# Patient Record
Sex: Female | Born: 1945 | Race: White | Hispanic: No | Marital: Married | Smoking: Former smoker
Health system: Southern US, Community
[De-identification: ages and names within clinical notes are randomized; demographics above are authoritative.]

## PROBLEM LIST (undated history)

## (undated) DIAGNOSIS — E785 Hyperlipidemia, unspecified: Secondary | ICD-10-CM

## (undated) DIAGNOSIS — M1990 Unspecified osteoarthritis, unspecified site: Secondary | ICD-10-CM

## (undated) DIAGNOSIS — T7840XA Allergy, unspecified, initial encounter: Secondary | ICD-10-CM

## (undated) DIAGNOSIS — I10 Essential (primary) hypertension: Secondary | ICD-10-CM

## (undated) DIAGNOSIS — J45909 Unspecified asthma, uncomplicated: Secondary | ICD-10-CM

## (undated) DIAGNOSIS — I743 Embolism and thrombosis of arteries of the lower extremities: Secondary | ICD-10-CM

## (undated) DIAGNOSIS — I480 Paroxysmal atrial fibrillation: Secondary | ICD-10-CM

## (undated) DIAGNOSIS — K5790 Diverticulosis of intestine, part unspecified, without perforation or abscess without bleeding: Secondary | ICD-10-CM

## (undated) DIAGNOSIS — D126 Benign neoplasm of colon, unspecified: Secondary | ICD-10-CM

## (undated) DIAGNOSIS — K589 Irritable bowel syndrome without diarrhea: Secondary | ICD-10-CM

## (undated) DIAGNOSIS — I739 Peripheral vascular disease, unspecified: Secondary | ICD-10-CM

## (undated) DIAGNOSIS — H548 Legal blindness, as defined in USA: Secondary | ICD-10-CM

## (undated) DIAGNOSIS — I341 Nonrheumatic mitral (valve) prolapse: Secondary | ICD-10-CM

## (undated) HISTORY — DX: Nonrheumatic mitral (valve) prolapse: I34.1

## (undated) HISTORY — DX: Unspecified asthma, uncomplicated: J45.909

## (undated) HISTORY — PX: COLONOSCOPY W/ POLYPECTOMY: SHX1380

## (undated) HISTORY — DX: Benign neoplasm of colon, unspecified: D12.6

## (undated) HISTORY — DX: Peripheral vascular disease, unspecified: I73.9

## (undated) HISTORY — DX: Essential (primary) hypertension: I10

## (undated) HISTORY — DX: Allergy, unspecified, initial encounter: T78.40XA

## (undated) HISTORY — DX: Hyperlipidemia, unspecified: E78.5

## (undated) HISTORY — PX: ABDOMINAL HYSTERECTOMY: SHX81

## (undated) HISTORY — PX: SHOULDER SURGERY: SHX246

## (undated) HISTORY — DX: Diverticulosis of intestine, part unspecified, without perforation or abscess without bleeding: K57.90

## (undated) HISTORY — PX: APPENDECTOMY: SHX54

## (undated) HISTORY — DX: Irritable bowel syndrome, unspecified: K58.9

## (undated) HISTORY — PX: TONSILLECTOMY: SUR1361

## (undated) HISTORY — DX: Unspecified osteoarthritis, unspecified site: M19.90

---

## 1998-06-14 HISTORY — PX: LUMBAR LAMINECTOMY: SHX95

## 1999-08-03 ENCOUNTER — Other Ambulatory Visit: Admission: RE | Admit: 1999-08-03 | Discharge: 1999-08-03 | Payer: Self-pay | Admitting: Obstetrics & Gynecology

## 2000-09-22 ENCOUNTER — Encounter: Payer: Self-pay | Admitting: Neurosurgery

## 2000-09-22 ENCOUNTER — Encounter: Admission: RE | Admit: 2000-09-22 | Discharge: 2000-09-22 | Payer: Self-pay | Admitting: Neurosurgery

## 2000-10-17 ENCOUNTER — Encounter: Payer: Self-pay | Admitting: Neurosurgery

## 2000-10-18 ENCOUNTER — Encounter: Payer: Self-pay | Admitting: Neurosurgery

## 2000-10-18 ENCOUNTER — Inpatient Hospital Stay (HOSPITAL_COMMUNITY): Admission: RE | Admit: 2000-10-18 | Discharge: 2000-10-19 | Payer: Self-pay | Admitting: Neurosurgery

## 2001-03-03 ENCOUNTER — Encounter: Payer: Self-pay | Admitting: Neurosurgery

## 2001-03-03 ENCOUNTER — Ambulatory Visit (HOSPITAL_COMMUNITY): Admission: RE | Admit: 2001-03-03 | Discharge: 2001-03-03 | Payer: Self-pay | Admitting: Neurosurgery

## 2004-05-21 ENCOUNTER — Ambulatory Visit: Payer: Self-pay | Admitting: Internal Medicine

## 2004-05-27 ENCOUNTER — Ambulatory Visit: Payer: Self-pay | Admitting: Internal Medicine

## 2004-09-22 ENCOUNTER — Other Ambulatory Visit: Admission: RE | Admit: 2004-09-22 | Discharge: 2004-09-22 | Payer: Self-pay | Admitting: Obstetrics & Gynecology

## 2004-12-10 ENCOUNTER — Ambulatory Visit: Payer: Self-pay | Admitting: Internal Medicine

## 2005-05-25 ENCOUNTER — Ambulatory Visit: Payer: Self-pay | Admitting: Internal Medicine

## 2005-05-28 ENCOUNTER — Ambulatory Visit: Payer: Self-pay | Admitting: Internal Medicine

## 2005-06-29 ENCOUNTER — Ambulatory Visit: Payer: Self-pay | Admitting: Gastroenterology

## 2005-07-05 ENCOUNTER — Ambulatory Visit: Payer: Self-pay | Admitting: Gastroenterology

## 2005-07-05 ENCOUNTER — Encounter (INDEPENDENT_AMBULATORY_CARE_PROVIDER_SITE_OTHER): Payer: Self-pay | Admitting: *Deleted

## 2006-01-04 ENCOUNTER — Ambulatory Visit: Payer: Self-pay | Admitting: Internal Medicine

## 2006-01-18 ENCOUNTER — Ambulatory Visit: Payer: Self-pay | Admitting: Internal Medicine

## 2006-04-07 ENCOUNTER — Ambulatory Visit: Payer: Self-pay | Admitting: Internal Medicine

## 2006-05-26 ENCOUNTER — Ambulatory Visit: Payer: Self-pay | Admitting: Internal Medicine

## 2006-06-01 ENCOUNTER — Ambulatory Visit: Payer: Self-pay | Admitting: Internal Medicine

## 2006-06-01 LAB — CONVERTED CEMR LAB
ALT: 32 units/L (ref 0–40)
AST: 30 units/L (ref 0–37)
BUN: 13 mg/dL (ref 6–23)
Chol/HDL Ratio, serum: 1.8
Cholesterol: 261 mg/dL (ref 0–200)
Creatinine, Ser: 0.6 mg/dL (ref 0.4–1.2)
Glucose, Bld: 83 mg/dL (ref 70–99)
HDL: 141.1 mg/dL (ref >39.0)
LDL DIRECT: 98.6 mg/dL
Potassium: 4.3 meq/L (ref 3.5–5.1)
Triglyceride fasting, serum: 51 mg/dL (ref 0–149)
VLDL: 10 mg/dL (ref 0–40)

## 2006-06-23 ENCOUNTER — Encounter: Admission: RE | Admit: 2006-06-23 | Discharge: 2006-06-23 | Payer: Self-pay | Admitting: Sports Medicine

## 2006-07-07 ENCOUNTER — Encounter: Admission: RE | Admit: 2006-07-07 | Discharge: 2006-07-07 | Payer: Self-pay | Admitting: Sports Medicine

## 2006-07-21 ENCOUNTER — Ambulatory Visit: Payer: Self-pay | Admitting: Internal Medicine

## 2006-08-12 ENCOUNTER — Ambulatory Visit: Payer: Self-pay | Admitting: Internal Medicine

## 2006-08-15 ENCOUNTER — Encounter: Admission: RE | Admit: 2006-08-15 | Discharge: 2006-08-15 | Payer: Self-pay | Admitting: Sports Medicine

## 2006-11-02 ENCOUNTER — Ambulatory Visit: Payer: Self-pay | Admitting: Internal Medicine

## 2006-11-08 DIAGNOSIS — N342 Other urethritis: Secondary | ICD-10-CM | POA: Insufficient documentation

## 2007-02-23 ENCOUNTER — Ambulatory Visit: Payer: Self-pay | Admitting: Internal Medicine

## 2007-02-23 DIAGNOSIS — J449 Chronic obstructive pulmonary disease, unspecified: Secondary | ICD-10-CM | POA: Insufficient documentation

## 2007-02-23 LAB — CONVERTED CEMR LAB
Cholesterol, target level: 200 mg/dL
HDL goal, serum: 40 mg/dL
LDL Goal: 100 mg/dL

## 2007-06-02 ENCOUNTER — Telehealth (INDEPENDENT_AMBULATORY_CARE_PROVIDER_SITE_OTHER): Payer: Self-pay | Admitting: *Deleted

## 2007-06-06 ENCOUNTER — Ambulatory Visit: Payer: Self-pay | Admitting: Internal Medicine

## 2007-06-06 DIAGNOSIS — I10 Essential (primary) hypertension: Secondary | ICD-10-CM | POA: Insufficient documentation

## 2007-06-06 DIAGNOSIS — E785 Hyperlipidemia, unspecified: Secondary | ICD-10-CM | POA: Insufficient documentation

## 2007-06-21 ENCOUNTER — Ambulatory Visit: Payer: Self-pay

## 2007-06-21 ENCOUNTER — Ambulatory Visit: Payer: Self-pay | Admitting: Internal Medicine

## 2007-08-15 ENCOUNTER — Telehealth: Payer: Self-pay | Admitting: Internal Medicine

## 2007-08-17 ENCOUNTER — Encounter (INDEPENDENT_AMBULATORY_CARE_PROVIDER_SITE_OTHER): Payer: Self-pay | Admitting: *Deleted

## 2007-09-21 ENCOUNTER — Ambulatory Visit: Payer: Self-pay | Admitting: Internal Medicine

## 2007-09-21 DIAGNOSIS — K219 Gastro-esophageal reflux disease without esophagitis: Secondary | ICD-10-CM | POA: Insufficient documentation

## 2007-09-28 ENCOUNTER — Ambulatory Visit: Payer: Self-pay | Admitting: Internal Medicine

## 2007-09-28 ENCOUNTER — Encounter (INDEPENDENT_AMBULATORY_CARE_PROVIDER_SITE_OTHER): Payer: Self-pay | Admitting: *Deleted

## 2007-09-28 LAB — CONVERTED CEMR LAB
OCCULT 1: NEGATIVE
OCCULT 2: NEGATIVE
OCCULT 3: NEGATIVE

## 2007-10-16 ENCOUNTER — Ambulatory Visit: Payer: Self-pay | Admitting: Internal Medicine

## 2007-10-16 DIAGNOSIS — K589 Irritable bowel syndrome without diarrhea: Secondary | ICD-10-CM | POA: Insufficient documentation

## 2007-10-24 ENCOUNTER — Encounter: Admission: RE | Admit: 2007-10-24 | Discharge: 2007-10-24 | Payer: Self-pay | Admitting: Sports Medicine

## 2007-11-14 ENCOUNTER — Encounter: Admission: RE | Admit: 2007-11-14 | Discharge: 2007-11-14 | Payer: Self-pay | Admitting: Sports Medicine

## 2007-11-27 ENCOUNTER — Ambulatory Visit: Payer: Self-pay | Admitting: Internal Medicine

## 2007-11-27 ENCOUNTER — Telehealth (INDEPENDENT_AMBULATORY_CARE_PROVIDER_SITE_OTHER): Payer: Self-pay | Admitting: *Deleted

## 2007-11-27 DIAGNOSIS — R51 Headache: Secondary | ICD-10-CM | POA: Insufficient documentation

## 2007-11-27 DIAGNOSIS — R42 Dizziness and giddiness: Secondary | ICD-10-CM | POA: Insufficient documentation

## 2007-11-27 DIAGNOSIS — R269 Unspecified abnormalities of gait and mobility: Secondary | ICD-10-CM | POA: Insufficient documentation

## 2007-11-27 DIAGNOSIS — R519 Headache, unspecified: Secondary | ICD-10-CM | POA: Insufficient documentation

## 2007-12-06 ENCOUNTER — Ambulatory Visit: Payer: Self-pay | Admitting: Internal Medicine

## 2007-12-06 DIAGNOSIS — G252 Other specified forms of tremor: Secondary | ICD-10-CM | POA: Insufficient documentation

## 2007-12-06 DIAGNOSIS — G250 Essential tremor: Secondary | ICD-10-CM | POA: Insufficient documentation

## 2007-12-08 ENCOUNTER — Encounter (INDEPENDENT_AMBULATORY_CARE_PROVIDER_SITE_OTHER): Payer: Self-pay | Admitting: *Deleted

## 2007-12-21 ENCOUNTER — Ambulatory Visit: Payer: Self-pay

## 2007-12-21 ENCOUNTER — Encounter: Payer: Self-pay | Admitting: Internal Medicine

## 2008-01-10 ENCOUNTER — Telehealth (INDEPENDENT_AMBULATORY_CARE_PROVIDER_SITE_OTHER): Payer: Self-pay | Admitting: *Deleted

## 2008-01-10 ENCOUNTER — Encounter (INDEPENDENT_AMBULATORY_CARE_PROVIDER_SITE_OTHER): Payer: Self-pay | Admitting: *Deleted

## 2008-02-01 ENCOUNTER — Ambulatory Visit: Payer: Self-pay | Admitting: Cardiovascular Disease

## 2008-03-05 ENCOUNTER — Ambulatory Visit: Payer: Self-pay | Admitting: Internal Medicine

## 2008-05-02 ENCOUNTER — Ambulatory Visit: Payer: Self-pay | Admitting: Internal Medicine

## 2008-05-06 ENCOUNTER — Telehealth (INDEPENDENT_AMBULATORY_CARE_PROVIDER_SITE_OTHER): Payer: Self-pay | Admitting: *Deleted

## 2008-06-03 ENCOUNTER — Telehealth: Payer: Self-pay | Admitting: Internal Medicine

## 2008-06-03 ENCOUNTER — Ambulatory Visit: Payer: Self-pay | Admitting: Internal Medicine

## 2008-06-03 DIAGNOSIS — Z86010 Personal history of colonic polyps: Secondary | ICD-10-CM | POA: Insufficient documentation

## 2008-06-03 DIAGNOSIS — M810 Age-related osteoporosis without current pathological fracture: Secondary | ICD-10-CM | POA: Insufficient documentation

## 2008-06-05 ENCOUNTER — Telehealth (INDEPENDENT_AMBULATORY_CARE_PROVIDER_SITE_OTHER): Payer: Self-pay | Admitting: *Deleted

## 2008-06-10 ENCOUNTER — Ambulatory Visit: Payer: Self-pay | Admitting: Internal Medicine

## 2008-06-15 LAB — CONVERTED CEMR LAB
ALT: 32 units/L (ref 0–35)
AST: 37 units/L (ref 0–37)
Albumin: 4 g/dL (ref 3.5–5.2)
Alkaline Phosphatase: 88 units/L (ref 39–117)
BUN: 13 mg/dL (ref 6–23)
Basophils Absolute: 0 10*3/uL (ref 0.0–0.1)
Basophils Relative: 0.3 % (ref 0.0–3.0)
Bilirubin, Direct: 0.1 mg/dL (ref 0.0–0.3)
CO2: 33 meq/L — ABNORMAL HIGH (ref 19–32)
Calcium: 8.7 mg/dL (ref 8.4–10.5)
Chloride: 96 meq/L (ref 96–112)
Cholesterol: 296 mg/dL (ref 0–200)
Creatinine, Ser: 0.5 mg/dL (ref 0.4–1.2)
Direct LDL: 120.3 mg/dL
Eosinophils Absolute: 0.1 10*3/uL (ref 0.0–0.7)
Eosinophils Relative: 1.5 % (ref 0.0–5.0)
GFR calc Af Amer: 160 mL/min
GFR calc non Af Amer: 132 mL/min
Glucose, Bld: 72 mg/dL (ref 70–99)
HCT: 40.9 % (ref 36.0–46.0)
HDL: 135 mg/dL (ref >39.0)
Hemoglobin: 14.1 g/dL (ref 12.0–15.0)
Lymphocytes Relative: 32.2 % (ref 12.0–46.0)
MCHC: 34.6 g/dL (ref 30.0–36.0)
MCV: 103.1 fL — ABNORMAL HIGH (ref 78.0–100.0)
Monocytes Absolute: 0.7 10*3/uL (ref 0.1–1.0)
Monocytes Relative: 8.5 % (ref 3.0–12.0)
Neutro Abs: 5.1 10*3/uL (ref 1.4–7.7)
Neutrophils Relative %: 57.5 % (ref 43.0–77.0)
Platelets: 328 10*3/uL (ref 150–400)
Potassium: 3.4 meq/L — ABNORMAL LOW (ref 3.5–5.1)
RBC: 3.97 M/uL (ref 3.87–5.11)
RDW: 13.9 % (ref 11.5–14.6)
Sodium: 138 meq/L (ref 135–145)
TSH: 4.67 microintl units/mL (ref 0.35–5.50)
Total Bilirubin: 0.7 mg/dL (ref 0.3–1.2)
Total Protein: 7.2 g/dL (ref 6.0–8.3)
Triglycerides: 81 mg/dL (ref 0–149)
VLDL: 16 mg/dL (ref 0–40)
WBC: 8.7 10*3/uL (ref 4.5–10.5)

## 2008-06-17 ENCOUNTER — Encounter (INDEPENDENT_AMBULATORY_CARE_PROVIDER_SITE_OTHER): Payer: Self-pay | Admitting: *Deleted

## 2008-06-25 ENCOUNTER — Encounter (INDEPENDENT_AMBULATORY_CARE_PROVIDER_SITE_OTHER): Payer: Self-pay | Admitting: *Deleted

## 2008-06-27 ENCOUNTER — Ambulatory Visit: Payer: Self-pay | Admitting: Internal Medicine

## 2008-06-27 LAB — CONVERTED CEMR LAB
OCCULT 1: NEGATIVE
OCCULT 2: NEGATIVE
OCCULT 3: POSITIVE

## 2008-07-08 ENCOUNTER — Ambulatory Visit: Payer: Self-pay | Admitting: Internal Medicine

## 2008-07-30 ENCOUNTER — Ambulatory Visit: Payer: Self-pay | Admitting: Gastroenterology

## 2008-08-06 ENCOUNTER — Encounter: Payer: Self-pay | Admitting: Gastroenterology

## 2008-08-06 ENCOUNTER — Ambulatory Visit: Payer: Self-pay | Admitting: Gastroenterology

## 2008-08-06 ENCOUNTER — Telehealth (INDEPENDENT_AMBULATORY_CARE_PROVIDER_SITE_OTHER): Payer: Self-pay | Admitting: *Deleted

## 2008-08-06 LAB — HM COLONOSCOPY: HM Colonoscopy: NORMAL

## 2008-08-07 ENCOUNTER — Encounter: Payer: Self-pay | Admitting: Gastroenterology

## 2008-09-09 ENCOUNTER — Ambulatory Visit: Payer: Self-pay | Admitting: Internal Medicine

## 2008-09-10 ENCOUNTER — Encounter (INDEPENDENT_AMBULATORY_CARE_PROVIDER_SITE_OTHER): Payer: Self-pay | Admitting: *Deleted

## 2008-09-10 LAB — CONVERTED CEMR LAB: Potassium: 4.2 meq/L (ref 3.5–5.1)

## 2009-03-24 ENCOUNTER — Encounter: Payer: Self-pay | Admitting: Internal Medicine

## 2009-05-14 ENCOUNTER — Encounter: Payer: Self-pay | Admitting: Internal Medicine

## 2009-06-04 ENCOUNTER — Ambulatory Visit: Payer: Self-pay | Admitting: Internal Medicine

## 2009-06-05 ENCOUNTER — Ambulatory Visit: Payer: Self-pay | Admitting: Internal Medicine

## 2009-06-08 LAB — CONVERTED CEMR LAB
ALT: 44 units/L — ABNORMAL HIGH (ref 0–35)
AST: 52 units/L — ABNORMAL HIGH (ref 0–37)
Albumin: 4.5 g/dL (ref 3.5–5.2)
Alkaline Phosphatase: 81 units/L (ref 39–117)
BUN: 8 mg/dL (ref 6–23)
Basophils Absolute: 0.1 10*3/uL (ref 0.0–0.1)
Basophils Relative: 0.7 % (ref 0.0–3.0)
Bilirubin, Direct: 0 mg/dL (ref 0.0–0.3)
CO2: 31 meq/L (ref 19–32)
Calcium: 9.6 mg/dL (ref 8.4–10.5)
Chloride: 96 meq/L (ref 96–112)
Cholesterol: 290 mg/dL — ABNORMAL HIGH (ref 0–200)
Creatinine, Ser: 0.6 mg/dL (ref 0.4–1.2)
Direct LDL: 101.5 mg/dL
Eosinophils Absolute: 0.3 10*3/uL (ref 0.0–0.7)
Eosinophils Relative: 3.4 % (ref 0.0–5.0)
Free T4: 0.8 ng/dL (ref 0.6–1.6)
GFR calc non Af Amer: 106.54 mL/min (ref >60)
Glucose, Bld: 81 mg/dL (ref 70–99)
HCT: 46.1 % — ABNORMAL HIGH (ref 36.0–46.0)
HDL: 172 mg/dL (ref >39.00)
Hemoglobin: 15.3 g/dL — ABNORMAL HIGH (ref 12.0–15.0)
Lymphocytes Relative: 25.4 % (ref 12.0–46.0)
Lymphs Abs: 2 10*3/uL (ref 0.7–4.0)
MCHC: 33.2 g/dL (ref 30.0–36.0)
MCV: 104.4 fL — ABNORMAL HIGH (ref 78.0–100.0)
Monocytes Absolute: 0.8 10*3/uL (ref 0.1–1.0)
Monocytes Relative: 9.9 % (ref 3.0–12.0)
Neutro Abs: 4.7 10*3/uL (ref 1.4–7.7)
Neutrophils Relative %: 60.6 % (ref 43.0–77.0)
Platelets: 292 10*3/uL (ref 150.0–400.0)
Potassium: 3.5 meq/L (ref 3.5–5.1)
RBC: 4.42 M/uL (ref 3.87–5.11)
RDW: 13.3 % (ref 11.5–14.6)
Sodium: 138 meq/L (ref 135–145)
TSH: 3.77 microintl units/mL (ref 0.35–5.50)
Theophylline Lvl: 12.4 ug/mL (ref 10.0–20.0)
Total Bilirubin: 0.9 mg/dL (ref 0.3–1.2)
Total CHOL/HDL Ratio: 2
Total Protein: 7.6 g/dL (ref 6.0–8.3)
Triglycerides: 76 mg/dL (ref 0.0–149.0)
VLDL: 15.2 mg/dL (ref 0.0–40.0)
Vit D, 25-Hydroxy: 14 ng/mL — ABNORMAL LOW (ref 30–89)
WBC: 7.9 10*3/uL (ref 4.5–10.5)

## 2009-06-09 ENCOUNTER — Encounter (INDEPENDENT_AMBULATORY_CARE_PROVIDER_SITE_OTHER): Payer: Self-pay | Admitting: *Deleted

## 2009-09-04 ENCOUNTER — Ambulatory Visit: Payer: Self-pay | Admitting: Internal Medicine

## 2009-09-07 LAB — CONVERTED CEMR LAB
ALT: 36 units/L — ABNORMAL HIGH (ref 0–35)
AST: 36 units/L (ref 0–37)
Albumin: 4.1 g/dL (ref 3.5–5.2)
Alkaline Phosphatase: 74 units/L (ref 39–117)
Bilirubin, Direct: 0 mg/dL (ref 0.0–0.3)
Cholesterol: 251 mg/dL — ABNORMAL HIGH (ref 0–200)
Direct LDL: 103.1 mg/dL
HDL: 139.8 mg/dL (ref >39.00)
Total Bilirubin: 0.4 mg/dL (ref 0.3–1.2)
Total CHOL/HDL Ratio: 2
Total Protein: 7.1 g/dL (ref 6.0–8.3)
Triglycerides: 80 mg/dL (ref 0.0–149.0)
VLDL: 16 mg/dL (ref 0.0–40.0)

## 2009-09-11 ENCOUNTER — Ambulatory Visit: Payer: Self-pay | Admitting: Family Medicine

## 2009-09-30 ENCOUNTER — Ambulatory Visit: Payer: Self-pay | Admitting: Internal Medicine

## 2010-02-03 ENCOUNTER — Ambulatory Visit: Payer: Self-pay | Admitting: Internal Medicine

## 2010-02-04 ENCOUNTER — Ambulatory Visit: Payer: Self-pay | Admitting: Internal Medicine

## 2010-02-05 LAB — CONVERTED CEMR LAB
BUN: 7 mg/dL (ref 6–23)
Basophils Absolute: 0.1 10*3/uL (ref 0.0–0.1)
Basophils Relative: 0.7 % (ref 0.0–3.0)
CO2: 29 meq/L (ref 19–32)
Calcium: 9.7 mg/dL (ref 8.4–10.5)
Chloride: 98 meq/L (ref 96–112)
Creatinine, Ser: 0.6 mg/dL (ref 0.4–1.2)
Eosinophils Absolute: 0 10*3/uL (ref 0.0–0.7)
Eosinophils Relative: 0.5 % (ref 0.0–5.0)
Free T4: 0.78 ng/dL (ref 0.60–1.60)
GFR calc non Af Amer: 104.31 mL/min (ref >60)
Glucose, Bld: 92 mg/dL (ref 70–99)
HCT: 45.8 % (ref 36.0–46.0)
Hemoglobin: 15.6 g/dL — ABNORMAL HIGH (ref 12.0–15.0)
Lymphocytes Relative: 17.5 % (ref 12.0–46.0)
Lymphs Abs: 1.6 10*3/uL (ref 0.7–4.0)
MCHC: 34.2 g/dL (ref 30.0–36.0)
MCV: 105.7 fL — ABNORMAL HIGH (ref 78.0–100.0)
Magnesium: 1.7 mg/dL (ref 1.5–2.5)
Monocytes Absolute: 0.8 10*3/uL (ref 0.1–1.0)
Monocytes Relative: 8.5 % (ref 3.0–12.0)
Neutro Abs: 6.5 10*3/uL (ref 1.4–7.7)
Neutrophils Relative %: 72.8 % (ref 43.0–77.0)
Platelets: 316 10*3/uL (ref 150.0–400.0)
Potassium: 4.3 meq/L (ref 3.5–5.1)
RBC: 4.33 M/uL (ref 3.87–5.11)
RDW: 14.4 % (ref 11.5–14.6)
Sodium: 140 meq/L (ref 135–145)
TSH: 2.88 microintl units/mL (ref 0.35–5.50)
Theophylline Lvl: 12.1 ug/mL (ref 10.0–20.0)
WBC: 9 10*3/uL (ref 4.5–10.5)

## 2010-06-05 ENCOUNTER — Encounter: Payer: Self-pay | Admitting: Internal Medicine

## 2010-06-05 ENCOUNTER — Ambulatory Visit: Payer: Self-pay | Admitting: Internal Medicine

## 2010-06-05 DIAGNOSIS — R0902 Hypoxemia: Secondary | ICD-10-CM | POA: Insufficient documentation

## 2010-06-05 DIAGNOSIS — I491 Atrial premature depolarization: Secondary | ICD-10-CM | POA: Insufficient documentation

## 2010-06-05 DIAGNOSIS — R0989 Other specified symptoms and signs involving the circulatory and respiratory systems: Secondary | ICD-10-CM | POA: Insufficient documentation

## 2010-06-09 ENCOUNTER — Ambulatory Visit
Admission: RE | Admit: 2010-06-09 | Discharge: 2010-06-09 | Payer: Self-pay | Source: Home / Self Care | Attending: Internal Medicine | Admitting: Internal Medicine

## 2010-06-09 LAB — CONVERTED CEMR LAB
ALT: 39 units/L — ABNORMAL HIGH (ref 0–35)
AST: 47 units/L — ABNORMAL HIGH (ref 0–37)
Albumin: 4.5 g/dL (ref 3.5–5.2)
Alkaline Phosphatase: 102 units/L (ref 39–117)
BUN: 14 mg/dL (ref 6–23)
Basophils Absolute: 0.1 10*3/uL (ref 0.0–0.1)
Basophils Relative: 0.6 % (ref 0.0–3.0)
Bilirubin, Direct: 0.2 mg/dL (ref 0.0–0.3)
CO2: 29 meq/L (ref 19–32)
Calcium: 9.5 mg/dL (ref 8.4–10.5)
Chloride: 92 meq/L — ABNORMAL LOW (ref 96–112)
Cholesterol: 277 mg/dL — ABNORMAL HIGH (ref 0–200)
Creatinine, Ser: 0.6 mg/dL (ref 0.4–1.2)
Direct LDL: 105.6 mg/dL
Eosinophils Absolute: 0.1 10*3/uL (ref 0.0–0.7)
Eosinophils Relative: 0.6 % (ref 0.0–5.0)
GFR calc non Af Amer: 98.59 mL/min (ref >60.00)
Glucose, Bld: 79 mg/dL (ref 70–99)
HCT: 44.7 % (ref 36.0–46.0)
HDL: 138.6 mg/dL (ref >39.00)
Hemoglobin: 15.3 g/dL — ABNORMAL HIGH (ref 12.0–15.0)
Lymphocytes Relative: 16.2 % (ref 12.0–46.0)
Lymphs Abs: 2.1 10*3/uL (ref 0.7–4.0)
MCHC: 34.2 g/dL (ref 30.0–36.0)
MCV: 102.8 fL — ABNORMAL HIGH (ref 78.0–100.0)
Monocytes Absolute: 1 10*3/uL (ref 0.1–1.0)
Monocytes Relative: 8.1 % (ref 3.0–12.0)
Neutro Abs: 9.5 10*3/uL — ABNORMAL HIGH (ref 1.4–7.7)
Neutrophils Relative %: 74.5 % (ref 43.0–77.0)
Platelets: 330 10*3/uL (ref 150.0–400.0)
Potassium: 3.5 meq/L (ref 3.5–5.1)
RBC: 4.34 M/uL (ref 3.87–5.11)
RDW: 13.6 % (ref 11.5–14.6)
Sodium: 136 meq/L (ref 135–145)
TSH: 2.83 microintl units/mL (ref 0.35–5.50)
Total Bilirubin: 0.8 mg/dL (ref 0.3–1.2)
Total CHOL/HDL Ratio: 2
Total Protein: 7.5 g/dL (ref 6.0–8.3)
Triglycerides: 53 mg/dL (ref 0.0–149.0)
VLDL: 10.6 mg/dL (ref 0.0–40.0)
Vit D, 25-Hydroxy: 18 ng/mL — ABNORMAL LOW (ref 30–89)
WBC: 12.7 10*3/uL — ABNORMAL HIGH (ref 4.5–10.5)

## 2010-06-12 ENCOUNTER — Telehealth (INDEPENDENT_AMBULATORY_CARE_PROVIDER_SITE_OTHER): Payer: Self-pay | Admitting: *Deleted

## 2010-06-16 ENCOUNTER — Encounter: Payer: Self-pay | Admitting: Internal Medicine

## 2010-06-17 ENCOUNTER — Encounter: Payer: Self-pay | Admitting: Internal Medicine

## 2010-06-17 ENCOUNTER — Ambulatory Visit
Admission: RE | Admit: 2010-06-17 | Discharge: 2010-06-17 | Payer: Self-pay | Source: Home / Self Care | Attending: Internal Medicine | Admitting: Internal Medicine

## 2010-06-18 ENCOUNTER — Ambulatory Visit: Admission: RE | Admit: 2010-06-18 | Discharge: 2010-06-18 | Payer: Self-pay | Source: Home / Self Care

## 2010-06-18 ENCOUNTER — Encounter: Payer: Self-pay | Admitting: Internal Medicine

## 2010-06-19 ENCOUNTER — Encounter: Payer: Self-pay | Admitting: Internal Medicine

## 2010-07-06 ENCOUNTER — Telehealth (INDEPENDENT_AMBULATORY_CARE_PROVIDER_SITE_OTHER): Payer: Self-pay | Admitting: *Deleted

## 2010-07-12 LAB — CONVERTED CEMR LAB
ALT: 24 units/L (ref 0–35)
AST: 28 units/L (ref 0–37)
Albumin: 4.6 g/dL (ref 3.5–5.2)
Alkaline Phosphatase: 85 units/L (ref 39–117)
BUN: 11 mg/dL (ref 6–23)
BUN: 9 mg/dL (ref 6–23)
Basophils Absolute: 0 10*3/uL (ref 0.0–0.1)
Basophils Absolute: 0 10*3/uL (ref 0.0–0.1)
Basophils Relative: 0.2 % (ref 0.0–1.0)
Basophils Relative: 0.5 % (ref 0.0–1.0)
Bilirubin, Direct: 0.1 mg/dL (ref 0.0–0.3)
CO2: 28 meq/L (ref 19–32)
CO2: 30 meq/L (ref 19–32)
Calcium: 9.3 mg/dL (ref 8.4–10.5)
Calcium: 9.5 mg/dL (ref 8.4–10.5)
Chloride: 100 meq/L (ref 96–112)
Chloride: 99 meq/L (ref 96–112)
Cholesterol: 272 mg/dL (ref 0–200)
Creatinine, Ser: 0.5 mg/dL (ref 0.4–1.2)
Creatinine, Ser: 0.8 mg/dL (ref 0.4–1.2)
Direct LDL: 121.2 mg/dL
Eosinophils Absolute: 0 10*3/uL (ref 0.0–0.7)
Eosinophils Absolute: 0.3 10*3/uL (ref 0.0–0.6)
Eosinophils Relative: 0.1 % (ref 0.0–5.0)
Eosinophils Relative: 3.2 % (ref 0.0–5.0)
GFR calc Af Amer: 160 mL/min
GFR calc Af Amer: 93 mL/min
GFR calc non Af Amer: 132 mL/min
GFR calc non Af Amer: 77 mL/min
Glucose, Bld: 105 mg/dL
Glucose, Bld: 77 mg/dL (ref 70–99)
Glucose, Bld: 94 mg/dL (ref 70–99)
HCT: 42.1 % (ref 36.0–46.0)
HCT: 42.7 % (ref 36.0–46.0)
HDL: 141.5 mg/dL (ref >39.0)
Hemoglobin: 14.4 g/dL
Hemoglobin: 14.9 g/dL (ref 12.0–15.0)
Hemoglobin: 15 g/dL (ref 12.0–15.0)
Lymphocytes Relative: 17.5 % (ref 12.0–46.0)
Lymphocytes Relative: 21.6 % (ref 12.0–46.0)
MCHC: 35.2 g/dL (ref 30.0–36.0)
MCHC: 35.4 g/dL (ref 30.0–36.0)
MCV: 100.3 fL — ABNORMAL HIGH (ref 78.0–100.0)
MCV: 103.5 fL — ABNORMAL HIGH (ref 78.0–100.0)
Monocytes Absolute: 0.7 10*3/uL (ref 0.1–1.0)
Monocytes Absolute: 0.7 10*3/uL (ref 0.2–0.7)
Monocytes Relative: 7.6 % (ref 3.0–11.0)
Monocytes Relative: 7.8 % (ref 3.0–12.0)
Neutro Abs: 6 10*3/uL (ref 1.4–7.7)
Neutro Abs: 7.2 10*3/uL (ref 1.4–7.7)
Neutrophils Relative %: 67.4 % (ref 43.0–77.0)
Neutrophils Relative %: 74.1 % (ref 43.0–77.0)
Platelets: 357 10*3/uL (ref 150–400)
Platelets: 390 10*3/uL (ref 150–400)
Potassium: 3.5 meq/L (ref 3.5–5.1)
Potassium: 4.3 meq/L (ref 3.5–5.1)
RBC: 4.13 M/uL (ref 3.87–5.11)
RBC: 4.2 M/uL (ref 3.87–5.11)
RDW: 13.3 % (ref 11.5–14.6)
RDW: 14.5 % (ref 11.5–14.6)
Sodium: 138 meq/L (ref 135–145)
Sodium: 141 meq/L (ref 135–145)
TSH: 4.04 microintl units/mL (ref 0.35–5.50)
Total Bilirubin: 0.9 mg/dL (ref 0.3–1.2)
Total CHOL/HDL Ratio: 1.9
Total Protein: 7.5 g/dL (ref 6.0–8.3)
Triglycerides: 55 mg/dL (ref 0–149)
VLDL: 11 mg/dL (ref 0–40)
WBC: 8.9 10*3/uL (ref 4.5–10.5)
WBC: 9.6 10*3/uL (ref 4.5–10.5)

## 2010-07-16 NOTE — Letter (Signed)
Summary: Results Follow up Letter  [facility]  [address] [place], [place] [address]   Phone: [phone]  Fax: [phone]    06/09/2009 MRN: [id number]  Crystal [name] [address] [place], [place]  [address]  Dear [name],  The following are the results of your recent test(s):  Test         Result    Pap Smear:        Normal _____  Not Normal _____ Comments: ______________________________________________________ Cholesterol: LDL(Bad cholesterol):         Your goal is less than:         HDL (Good cholesterol):       Your goal is more than: Comments:  ______________________________________________________ Mammogram:        Normal _____  Not Normal _____ Comments:  ___________________________________________________________________ Hemoccult:        Normal _____  Not normal _______ Comments:    _____________________________________________________________________ Other Tests: Please see attached labs done on 06/05/2009, call to schedule appointment for recheck    We routinely do not discuss normal results over the telephone.  If you desire a copy of the results, or you have any questions about this information we can discuss them at your next office visit.   Sincerely,

## 2010-07-16 NOTE — Miscellaneous (Signed)
Summary: Orders Update  Clinical Lists Changes  Orders: Added new Test order of Carotid Duplex (Carotid Duplex) - Signed 

## 2010-07-16 NOTE — Assessment & Plan Note (Signed)
Summary: ROA FOLLOW UP FOR NEW MEDICATION//ALJ   Vital Signs:  Patient Profile:   65 Years Old Female Weight:      139 pounds Temp:     97.8 degrees F oral Pulse rate:   88 / minute Resp:     14 per minute BP sitting:   130 / 82  (left arm) Cuff size:   large  Pt. in pain?   no  Vitals Entered By: Shonna Chock (Oct 16, 2007 3:41 PM)                  Chief Complaint:  MEDICATION FOLLOW-UP.  History of Present Illness: On Align until  10/14/07. Minor loose stool 09/15/07.Last colonoscopy 2007; due 2012 as per Dr Russella Dar.    Current Allergies (reviewed today): ! * ASA BASED MEDS ! ZOCOR     Review of Systems  General      Denies chills, fever, sweats, and weight loss.  GI      Denies abdominal pain, bloody stools, constipation, dark tarry stools, diarrhea, gas, indigestion, loss of appetite, nausea, and vomiting.      Eating smaller meals; no tiggers except soy protein drink. No issues with dairy or gluten. Occa bloating (GYn exam neg)  GU      Gyn exam neg 2 weeks ago   Physical Exam  General:     Well-developed,well-nourished,in no acute distress; alert,appropriate and cooperative throughout examination Mouth:     Oral mucosa and oropharynx without lesions or exudates.  Teeth in good repair. Neck:     No deformities, masses, or tenderness noted. Lungs:     Normal respiratory effort, chest expands symmetrically. Lungs are clear to auscultation, no crackles or wheezes. Heart:     Normal rate and regular rhythm. S1 and S2 normal without gallop, murmur, click, rub or other extra sounds. Abdomen:     Bowel sounds positive,abdomen soft and non-tender without masses, organomegaly or hernias noted. Skin:     Intact without suspicious lesions or rashes Cervical Nodes:     No lymphadenopathy noted Axillary Nodes:     No palpable lymphadenopathy    Impression & Recommendations:  Problem # 1:  IBS (ICD-564.1)  Complete Medication List: 1)  Proair Hfa 108 (90  Base) Mcg/act Aers (Albuterol sulfate) .... Prn 2)  Singulair 10 Mg Tabs (Montelukast sodium) .Marland Kitchen.. 1 daily 3)  Lasix 40 Mg Tabs (Furosemide) .Marland Kitchen.. 1 by mouth qd 4)  Calcium With Vitamin D 600mg   .... 1 daily 5)  Pravachol 40 Mg Tabs (Pravastatin sodium) .Marland Kitchen.. 1 qhs 6)  Theophylline Cr 200 Mg Tb12 (Theophylline) .Marland Kitchen.. 1 every 12hrs. 7)  Alprazolam 0.5 Mg Tabs (Alprazolam) .... Prn 8)  Cozaar 50 Mg Tabs (Losartan potassium) .... 1/2 daily 9)  Meclizine Hcl 25 Mg Tabs (Meclizine hcl) .... 1/2 -1 q 8 hours prn 10)  Align Caps (Misc intestinal flora regulat) .Marland Kitchen.. 1 by mouth once daily 11)  Mentax 1 % Crea (Butenafine hcl) .... Apply two times a day prn   Patient Instructions: 1)  Monitor for diarrhea triggers. Align once daily as needed    Prescriptions: MENTAX 1 %  CREA (BUTENAFINE HCL) apply two times a day prn  #30 grams x 2   Entered and Authorized by:   Marga Melnick MD   Signed by:   Marga Melnick MD on 10/16/2007   Method used:   Print then Give to Patient   RxID:   [id number]  ]

## 2010-07-16 NOTE — Progress Notes (Signed)
Summary: Discuss PFT Test  Phone Note Call from Patient Call back at Home Phone [phone]   Caller: Patient Summary of Call: Message left on voicemail: Patient is questioning if she still needs breathing/PFT test since chest xray is normal   I spoke with patient and informed her that he did not indicate that she should NOT have it done so she should proceed and have test done. Patient ok'd.Shonna Chock CMA  June 12, 2010 3:06 PM

## 2010-07-16 NOTE — Progress Notes (Signed)
Summary: Refill Request  Phone Note Refill Request Call back at [phone] Message from:  Pharmacy on July 06, 2010 9:04 AM  Refills Requested: Medication #1:  THEOPHYLLINE CR 200 MG  TB12 1 every 12hrs.   Dosage confirmed as above?Dosage Confirmed   Supply Requested: 180   Last Refilled: 04/11/2010   Notes: 3 refills [facility] on [address].   Next Appointment Scheduled: none Initial call taken by: Harold Barban,  July 06, 2010 9:04 AM    Prescriptions: THEOPHYLLINE CR 200 MG  TB12 (THEOPHYLLINE) 1 every 12hrs.  #180 x 2   Entered by:   Shonna Chock CMA   Authorized by:   Marga Melnick MD   Signed by:   Shonna Chock CMA on 07/06/2010   Method used:   Electronically to        [facility] [phone]* (retail)       [address]       [place], [place]  [address]       Ph: [phone] or [phone]       Fax: [phone]   RxID:   [id number]

## 2010-07-16 NOTE — Assessment & Plan Note (Signed)
Summary: FOR CONGESTION//PH   Vital Signs:  Patient profile:   65 year old female Weight:      141 pounds Temp:     98.0 degrees F oral Pulse rate:   82 / minute Pulse rhythm:   regular BP sitting:   122 / 80  (left arm) Cuff size:   regular  Vitals Entered By: Army Fossa CMA (September 11, 2009 11:07 AM) CC: Pt here c/o head congestion, chest congestion since tuesday. has tried Mucinex., Cough   History of Present Illness:  Cough      This is a 65 year old woman who presents with Cough.  The symptoms began 3 days ago.  The patient reports productive cough and wheezing, but denies non-productive cough, pleuritic chest pain, shortness of breath, exertional dyspnea, fever, hemoptysis, and malaise.  Associated symtpoms include sore throat.  The patient denies the following symptoms: cold/URI symptoms, nasal congestion, chronic rhinitis, weight loss, acid reflux symptoms, and peripheral edema.  The cough is worse with lying down.  Ineffective prior treatments have included OTC cough medication, albuterol inhaler, and other asthma medication.  Risk factors include history of asthma.    Current Medications (verified): 1)  Proair Hfa 108 (90 Base) Mcg/act Aers (Albuterol Sulfate) .... Prn 2)  Singulair 10 Mg Tabs (Montelukast Sodium) .Marland Kitchen.. 1 Daily 3)  Lasix 40 Mg Tabs (Furosemide) .Marland Kitchen.. 1 By Mouth Qd 4)  Calcium With Vitamin D 600mg  .... 1 Daily 5)  Pravachol 40 Mg  Tabs (Pravastatin Sodium) .... 1/2 At Bedtime Due To Mild Lft Elevation 6)  Theophylline Cr 200 Mg  Tb12 (Theophylline) .Marland Kitchen.. 1 Every 12hrs. 7)  Cozaar 50 Mg  Tabs (Losartan Potassium) .... 1/2 Daily 8)  Align   Caps (Misc Intestinal Flora Regulat) .Marland Kitchen.. 1 By Mouth Once Daily 9)  Mentax 1 %  Crea (Butenafine Hcl) .... Apply Two Times A Day Prn 10)  Tramadol Hcl 50 Mg  Tabs (Tramadol Hcl) .Marland Kitchen.. 1 Q 6 Hrs As Needed Ha or Back Pain 11)  Diazepam 5 Mg Tabs (Diazepam) .... 1/2 - 1 Q 8 Hrs As Needed Dizziness 12)  Prednisone 10 Mg Tabs  (Prednisone) .... 3 By Mouth Once Daily For 3 Days Then 2 By Mouth Once Daily For 3 Days Then 1 By Mouth Once Daily For 3 Days 13)  Augmentin 875-125 Mg Tabs (Amoxicillin-Pot Clavulanate) .Marland Kitchen.. 1 By Mouth Two Times A Day 14)  Cheratussin Ac 100-10 Mg/2ml Syrp (Guaifenesin-Codeine) .Marland Kitchen.. 1-2 Tsp By Mouth At Bedtime As Needed  Allergies: 1)  ! Jonne Ply Based Meds 2)  ! Zocor 3)  ! * Ivp Dye  Past History:  Past medical, surgical, family and social histories (including risk factors) reviewed for relevance to current acute and chronic problems.  Past Medical History: Reviewed history from 06/04/2009 and no changes required. IBS MVP diagnosed by  2D ECHO in 1980s Asthma (EIB ,perfume triggers) Hyperlipidemia, LDL goal = < 120 Peripheral vascular disease Colonic polyps, hx of, adenomatous, 06/2005, neg 2009, Dr Russella Dar Osteopenia : T score  -1.1 @ spine; -2.3 @ hip Hypertension Allergies Diverticulosis Hemorrhoids Anal Fissure 1970  Past Surgical History: Reviewed history from 06/04/2009 and no changes required. plastic surgery (eyelid surgery) Colon polypectomy 2007, Dr Russella Dar Hysterectomy & BSO &  incidental appendectomy  1973 for dysfunctional bleeding(done  in  [place]), now seeing  Dr Aldona Bar  Lumbar laminectomy 2002, Dr Venetia Maxon Tonsillectomy  Family History: Reviewed history from 06/04/2009 and no changes required. Father:  MI @  25 Mother: Parkinson's Siblings: bro d MI @ 48;3 sisters COAD ( emphysema); 2 sisters DM (1 IDDM)  Social History: Reviewed history from 06/04/2009 and no changes required. Former Smoker, quit 1990; low fat Married Alcohol use-yes: 2 glasses wine / night Regular exercise-yes: 30 min on Elliptical, water aerobics, Pilates  Review of Systems      See HPI  Physical Exam  General:  Well-developed,well-nourished,in no acute distress; alert,appropriate and cooperative throughout examination Ears:  External ear exam shows no significant lesions or  deformities.  Otoscopic examination reveals clear canals, tympanic membranes are intact bilaterally without bulging, retraction, inflammation or discharge. Hearing is grossly normal bilaterally. Nose:  External nasal examination shows no deformity or inflammation. Nasal mucosa are pink and moist without lesions or exudates. Mouth:  Oral mucosa and oropharynx without lesions or exudates.  Teeth in good repair. Neck:  No deformities, masses, or tenderness noted. Lungs:  R decreased breath sounds and L decreased breath sounds.   Heart:  normal rate.   Psych:  Oriented X3 and normally interactive.     Impression & Recommendations:  Problem # 1:  BRONCHITIS- ACUTE (ICD-466.0)  Her updated medication list for this problem includes:    Proair Hfa 108 (90 Base) Mcg/act Aers (Albuterol sulfate) .Marland Kitchen... Prn    Singulair 10 Mg Tabs (Montelukast sodium) .Marland Kitchen... 1 daily    Theophylline Cr 200 Mg Tb12 (Theophylline) .Marland Kitchen... 1 every 12hrs.    Augmentin 875-125 Mg Tabs (Amoxicillin-pot clavulanate) .Marland Kitchen... 1 by mouth two times a day    Cheratussin Ac 100-10 Mg/26ml Syrp (Guaifenesin-codeine) .Marland Kitchen... 1-2 tsp by mouth at bedtime as needed  Take antibiotics and other medications as directed. Encouraged to push clear liquids, get enough rest, and take acetaminophen as needed. To be seen in 5-7 days if no improvement, sooner if worse.  Orders: Nebulizer Tx (78295) Admin of Therapeutic Inj  intramuscular or subcutaneous (62130) Depo- Medrol 80mg  (J1040)  Complete Medication List: 1)  Proair Hfa 108 (90 Base) Mcg/act Aers (Albuterol sulfate) .... Prn 2)  Singulair 10 Mg Tabs (Montelukast sodium) .Marland Kitchen.. 1 daily 3)  Lasix 40 Mg Tabs (Furosemide) .Marland Kitchen.. 1 by mouth qd 4)  Calcium With Vitamin D 600mg   .... 1 daily 5)  Pravachol 40 Mg Tabs (Pravastatin sodium) .... 1/2 at bedtime due to mild lft elevation 6)  Theophylline Cr 200 Mg Tb12 (Theophylline) .Marland Kitchen.. 1 every 12hrs. 7)  Cozaar 50 Mg Tabs (Losartan potassium) .... 1/2  daily 8)  Align Caps (Misc intestinal flora regulat) .Marland Kitchen.. 1 by mouth once daily 9)  Mentax 1 % Crea (Butenafine hcl) .... Apply two times a day prn 10)  Tramadol Hcl 50 Mg Tabs (Tramadol hcl) .Marland Kitchen.. 1 q 6 hrs as needed ha or back pain 11)  Diazepam 5 Mg Tabs (Diazepam) .... 1/2 - 1 q 8 hrs as needed dizziness 12)  Prednisone 10 Mg Tabs (Prednisone) .... 3 by mouth once daily for 3 days then 2 by mouth once daily for 3 days then 1 by mouth once daily for 3 days 13)  Augmentin 875-125 Mg Tabs (Amoxicillin-pot clavulanate) .Marland Kitchen.. 1 by mouth two times a day 14)  Cheratussin Ac 100-10 Mg/96ml Syrp (Guaifenesin-codeine) .Marland Kitchen.. 1-2 tsp by mouth at bedtime as needed Prescriptions: CHERATUSSIN AC 100-10 MG/5ML SYRP (GUAIFENESIN-CODEINE) 1-2 tsp by mouth at bedtime as needed  #6 oz x 0   Entered and Authorized by:   Loreen Freud DO   Signed by:   Loreen Freud DO on 09/11/2009   Method  used:   Print then Give to Patient   RxID:   [id number] AUGMENTIN 875-125 MG TABS (AMOXICILLIN-POT CLAVULANATE) 1 by mouth two times a day  #20 x 0   Entered and Authorized by:   Loreen Freud DO   Signed by:   Loreen Freud DO on 09/11/2009   Method used:   Electronically to        [facility] [phone]* (retail)       [address]       [place], [place]  [address]       Ph: [phone] or [phone]       Fax: [phone]   RxID:   [id number] PREDNISONE 10 MG TABS (PREDNISONE) 3 by mouth once daily for 3 days then 2 by mouth once daily for 3 days then 1 by mouth once daily for 3 days  #18 x 0   Entered and Authorized by:   Loreen Freud DO   Signed by:   Loreen Freud DO on 09/11/2009   Method used:   Electronically to        [facility] [phone]* (retail)       [address]       [place], [place]  [address]       Ph: [phone] or [phone]       Fax: [phone]   RxID:   [id number]    Medication  Administration  Injection # 1:    Medication: Depo- Medrol 80mg     Diagnosis: BRONCHITIS- ACUTE (ICD-466.0)    Route: IM    Site: RUOQ gluteus    Exp Date: 04/2010    Lot #: [id number]    Mfr: novaplus    Patient tolerated injection without complications    Given by: Army Fossa CMA (September 11, 2009 11:37 AM)  Orders Added: 1)  Est. Patient Level IV [60737] 2)  Nebulizer Tx [10626] 3)  Admin of Therapeutic Inj  intramuscular or subcutaneous [96372] 4)  Depo- Medrol 80mg  [J1040]

## 2010-07-16 NOTE — Assessment & Plan Note (Signed)
Summary: still "winded"//lch   Vital Signs:  Patient profile:   65 year old female Weight:      143.4 pounds O2 Sat:      94 % Temp:     98.4 degrees F oral Pulse rate:   103 / minute Resp:     18 per minute BP sitting:   148 / 82  (left arm) Cuff size:   regular  Vitals Entered By: Shonna Chock (September 30, 2009 10:15 AM) CC: SOB Comments REVIEWED MED LIST, PATIENT AGREED DOSE AND INSTRUCTION CORRECT    Primary Care Provider:  Marga Melnick, MD  CC:  SOB.  History of Present Illness: Initially improved from last OV until 7 days ago w/o trigger.No change in meds (list reviewed); she is not on a controller @ present, previously on Flovent. No increase in rescue MDI use.  Allergies: 1)  ! Jonne Ply Based Meds 2)  ! Zocor 3)  ! * Ivp Dye  Review of Systems General:  Complains of chills; denies fever and sweats. ENT:  Complains of postnasal drainage; denies nasal congestion and sinus pressure; Frontal headache w/o facial pain or purulence. CV:  Complains of difficulty breathing at night; denies swelling of feet and swelling of hands. Resp:  Complains of cough, shortness of breath, sputum productive, and wheezing. Allergy:  Complains of sneezing; denies itching eyes.  Physical Exam  General:  well-nourished,in no acute distress but appears fatigued; alert,appropriate and cooperative throughout examination Ears:  Wax bilaterally Nose:  External nasal examination shows no deformity or inflammation. Nasal mucosa are pink and moist without lesions or exudates. Mouth:  Oral mucosa and oropharynx without lesions or exudates.  Teeth in good repair. Lungs:  Decreased BS except mild rhonchi @ bases Heart:  Normal rate and regular rhythm. S1 and S2 normal without gallop, murmur, click, rub.S4 Cervical Nodes:  No lymphadenopathy noted Axillary Nodes:  No palpable lymphadenopathy   Impression & Recommendations:  Problem # 1:  BRONCHITIS- ACUTE (ICD-466.0)  Her updated medication list  for this problem includes:    Proair Hfa 108 (90 Base) Mcg/act Aers (Albuterol sulfate) .Marland Kitchen... Prn    Singulair 10 Mg Tabs (Montelukast sodium) .Marland Kitchen... 1 daily    Theophylline Cr 200 Mg Tb12 (Theophylline) .Marland Kitchen... 1 every 12hrs.    Cheratussin Ac 100-10 Mg/46ml Syrp (Guaifenesin-codeine) .Marland Kitchen... 1-2 tsp by mouth at bedtime as needed    Azithromycin 250 Mg Tabs (Azithromycin) .Marland Kitchen... As per pack    Symbicort 160-4.5 Mcg/act Aero (Budesonide-formoterol fumarate) .Marland Kitchen... 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 Prn    Singulair 10 Mg Tabs (Montelukast sodium) .Marland Kitchen... 1 daily    Theophylline Cr 200 Mg Tb12 (Theophylline) .Marland Kitchen... 1 every 12hrs.    Symbicort 160-4.5 Mcg/act Aero (Budesonide-formoterol fumarate) .Marland Kitchen... 1-2 puffs every 12 hrs ; gargle & spit after use  Complete Medication List: 1)  Proair Hfa 108 (90 Base) Mcg/act Aers (Albuterol sulfate) .... Prn 2)  Singulair 10 Mg Tabs (Montelukast sodium) .Marland Kitchen.. 1 daily 3)  Lasix 40 Mg Tabs (Furosemide) .Marland Kitchen.. 1 by mouth qd 4)  Calcium With Vitamin D 600mg   .... 1 daily 5)  Pravachol 40 Mg Tabs (Pravastatin sodium) .... 1/2 at bedtime due to mild lft elevation 6)  Theophylline Cr 200 Mg Tb12 (Theophylline) .Marland Kitchen.. 1 every 12hrs. 7)  Cozaar 50 Mg Tabs (Losartan potassium) .... 1/2 daily 8)  Align Caps (Misc intestinal flora regulat) .Marland Kitchen.. 1 by mouth once daily 9)  Mentax 1 % Crea (Butenafine hcl) .... Apply two times a day prn 10)  Tramadol Hcl 50 Mg Tabs (Tramadol hcl) .Marland Kitchen.. 1 q 6 hrs as needed ha or back pain 11)  Diazepam 5 Mg Tabs (Diazepam) .... 1/2 - 1 q 8 hrs as needed dizziness 12)  Cheratussin Ac 100-10 Mg/51ml Syrp (Guaifenesin-codeine) .Marland Kitchen.. 1-2 tsp by mouth at bedtime as needed 13)  Azithromycin 250 Mg Tabs (Azithromycin) .... As per pack 14)  Symbicort 160-4.5 Mcg/act Aero  (Budesonide-formoterol fumarate) .Marland Kitchen.. 1-2 puffs every 12 hrs ; gargle & spit after use  Patient Instructions: 1)  Drink as much fluid as you can tolerate for the next few days. Prescriptions: SYMBICORT 160-4.5 MCG/ACT AERO (BUDESONIDE-FORMOTEROL FUMARATE) 1-2 puffs every 12 hrs ; gargle & spit after use  #1 x 11   Entered and Authorized by:   Marga Melnick MD   Signed by:   Marga Melnick MD on 09/30/2009   Method used:   Print then Give to Patient   RxID:   [id number] AZITHROMYCIN 250 MG TABS (AZITHROMYCIN) as per pack  #1 x 0   Entered and Authorized by:   Marga Melnick MD   Signed by:   Marga Melnick MD on 09/30/2009   Method used:   Print then Give to Patient   RxID:   [id number]

## 2010-07-16 NOTE — Letter (Signed)
Summary: Primary Care Consult Scheduled Letter  [facility]  [address] [place], [place] [address]   Phone: [phone]  Fax: [phone]      12/08/2007 MRN: [id number]  Crystal [name] [address] [place], [place]  [address]    Dear [name],      We have scheduled an appointment for you.  At the recommendation of Dr.Hopper, we have scheduled you a Doppler Test on July 9th at 1:30pm.  Their address is [address] The office phone number is [phone].  If this appointment day and time is not convenient for you, please feel free to call the office of the doctor you are being referred to at the number listed above and reschedule the appointment.     It is important for you to keep your scheduled appointments. We are here to make sure you are given good patient care. If you have questions or you have made changes to your appointment, please notify us at  [phone], ask for Tiffany.    Thank you,  Patient Care Coordinator [place] [facility]

## 2010-07-16 NOTE — Assessment & Plan Note (Signed)
Summary: FOR HEADACHE//PH   Vital Signs:  Patient profile:   65 year old female Weight:      145 pounds Temp:     98.1 degrees F oral Pulse rate:   88 / minute Resp:     17 per minute BP supine:   138 / 82  (left arm) BP sitting:   142 / 80  (left arm) BP standing:   140 / 86  (left arm) Cuff size:   regular  Vitals Entered By: Shonna Chock CMA (February 03, 2010 11:50 AM) CC: Ongoing concerns: Headaches and Tremors, Syncope   Primary Care Provider:  Marga Melnick, MD  CC:  Ongoing concerns: Headaches and Tremors and Syncope.  History of Present Illness:      This is a 65 year old woman who presents with  lightheadedness for 3-4 weeks.  The patient reports palpitations and lightheadedness, but denies loss of consciousness, premonitory symptoms, near loss of consciousness, chest pain, shortness of breath, and incontinence.  Associated symptoms include headache,intention  tremor of hands ,feeling warm, and diaphoresis("cold sweat").  The patient denies the following symptoms: abdominal discomfort, nausea, vomiting, pallor, focal weakness, blurred vision, and perioral numbness.  The patient reports the following precipitating factors: standing upright.  The symptoms have always  occurred in the setting of standing position.  No BPV symptoms.  Headaches      The patient also presents with Headaches for 2& 1/2 months ago in context of neck & shoulder pain. Dr Margaretha Sheffield  Rxed PT & traction with benefit..  The patient reports nasal congestion and photophobia, but denies tearing of eyes, sinus pain, sinus pressure, and phonophobia.  The headache is described as intermittent and dull.  The location of the pain is frontal & in occiput.  High-risk features (red flags) include neck pain/stiffness, pain worse with exertion, and age >50 years.  The patient denies the following high-risk features: fever, vision loss or change, focal weakness, and rash.  The headaches are ?precipitated by change in weather, ie  the recent heat wave.  No relieving factors; minimal response to ASA or NSAIDS. PMH of migraines similar to these ,but "these are not as bad"  Current Medications (verified): 1)  Proair Hfa 108 (90 Base) Mcg/act Aers (Albuterol Sulfate) .... Prn 2)  Singulair 10 Mg Tabs (Montelukast Sodium) .Marland Kitchen.. 1 Daily 3)  Lasix 40 Mg Tabs (Furosemide) .Marland Kitchen.. 1 By Mouth Qd 4)  Calcium With Vitamin D 600mg  .... 1 Daily 5)  Pravachol 40 Mg  Tabs (Pravastatin Sodium) .... 1/2 At Bedtime Due To Mild Lft Elevation 6)  Theophylline Cr 200 Mg  Tb12 (Theophylline) .Marland Kitchen.. 1 Every 12hrs. 7)  Cozaar 50 Mg  Tabs (Losartan Potassium) .... 1/2 Daily 8)  Align   Caps (Misc Intestinal Flora Regulat) .Marland Kitchen.. 1 By Mouth Once Daily 9)  Mentax 1 %  Crea (Butenafine Hcl) .... Apply Two Times A Day Prn 10)  Tramadol Hcl 50 Mg  Tabs (Tramadol Hcl) .Marland Kitchen.. 1 Q 6 Hrs As Needed Ha or Back Pain 11)  Diazepam 5 Mg Tabs (Diazepam) .... 1/2 - 1 Q 8 Hrs As Needed Dizziness 12)  Symbicort 160-4.5 Mcg/act Aero (Budesonide-Formoterol Fumarate) .Marland Kitchen.. 1-2 Puffs Every 12 Hrs ; Gargle & Spit After Use  Allergies: 1)  ! Jonne Ply Based Meds 2)  ! Zocor 3)  ! * Ivp Dye  Review of Systems GI:  Denies bloody stools and dark tarry stools. Psych:  Denies anxiety and depression.  Physical Exam  General:  Tremulous but  in no acute distress; alert,appropriate and cooperative throughout examination Eyes:  No corneal or conjunctival inflammation noted. EOMI. Perrla. Field of  Vision grossly normal. No lid lag Ears:  External ear exam shows no significant lesions or deformities.  Otoscopic examination reveals  wax L > R. Hearing is grossly normal bilaterally. Mouth:  Oral mucosa and oropharynx without lesions or exudates.  Teeth in good repair.No tongue deviation Neck:  No deformities, masses, or tenderness noted. Supple Lungs:  Normal respiratory effort, chest expands symmetrically. Lungs are clear to auscultation, no crackles or wheezes. Decreased BS Heart:   Normal rate and regular rhythm. S1 and S2 normal without gallop, murmur, click, rub or other extra sounds. Pulses:  R and L carotid  pulses are full and equal bilaterally. Bruit on L Extremities:  No clubbing, cyanosis, edema, or deformity noted with normal full range of motion of all joints.   Neurologic:  alert & oriented X3, strength normal in all extremities, gait slightly unsteady , DTRs symmetrical and 1& 1/2 +, finger-to-nose  with tremor; Romberg  slightly unsteady Cervical Nodes:  No lymphadenopathy noted Axillary Nodes:  No palpable lymphadenopathy Psych:  memory intact for recent and remote and slightly anxious.     Impression & Recommendations:  Problem # 1:  DIZZINESS (ICD-780.4)  Problem # 2:  HEADACHE (ICD-784.0)  Her updated medication list for this problem includes:    Tramadol Hcl 50 Mg Tabs (Tramadol hcl) .Marland Kitchen... 1 q 6 hrs as needed ha or back pain  Problem # 3:  TREMOR (ICD-781.0)  Complete Medication List: 1)  Proair Hfa 108 (90 Base) Mcg/act Aers (Albuterol sulfate) .... Prn 2)  Singulair 10 Mg Tabs (Montelukast sodium) .Marland Kitchen.. 1 daily 3)  Lasix 40 Mg Tabs (Furosemide) .Marland Kitchen.. 1 by mouth qd 4)  Calcium With Vitamin D 600mg   .... 1 daily 5)  Pravachol 40 Mg Tabs (Pravastatin sodium) .... 1/2 at bedtime due to mild lft elevation 6)  Theophylline Cr 200 Mg Tb12 (Theophylline) .Marland Kitchen.. 1 every 12hrs. 7)  Cozaar 50 Mg Tabs (Losartan potassium) .... 1/2 daily 8)  Align Caps (Misc intestinal flora regulat) .Marland Kitchen.. 1 by mouth once daily 9)  Mentax 1 % Crea (Butenafine hcl) .... Apply two times a day prn 10)  Tramadol Hcl 50 Mg Tabs (Tramadol hcl) .Marland Kitchen.. 1 q 6 hrs as needed ha or back pain 11)  Diazepam 5 Mg Tabs (Diazepam) .... 1/2 - 1 q 8 hrs as needed dizziness 12)  Symbicort 160-4.5 Mcg/act Aero (Budesonide-formoterol fumarate) .Marland Kitchen.. 1-2 puffs every 12 hrs ; gargle & spit after use 13)  Gabapentin 100 Mg Caps (Gabapentin) .Marland Kitchen.. 1 every 8 hrs as needed headache  Patient  Instructions: 1)  Isometric exercises pre standing. Fasting Labs in am: 2)  BMP ; Theophylline level; free T4;TSH ;Mg++;CBC w/ Diff . Prescriptions: GABAPENTIN 100 MG CAPS (GABAPENTIN) 1 every 8 hrs as needed headache  #30 x 1   Entered and Authorized by:   Marga Melnick MD   Signed by:   Marga Melnick MD on 02/03/2010   Method used:   Faxed to ...       [facility]  [address] [phone]* (retail)       [address]       [place], [place]  [address]       Ph: [phone] or [phone]       Fax: [phone]   RxID:   [id number]

## 2010-07-16 NOTE — Miscellaneous (Signed)
Summary: Orders Update-PFT Charges   Clinical Lists Changes  Orders: Added new Service order of Spirometry (Pre & Post) [phone]) - Signed Added new Service order of Lung Volumes (98119) - Signed Added new Service order of Carbon Monoxide diffusing w/capacity (14782) - Signed

## 2010-07-16 NOTE — Assessment & Plan Note (Signed)
Summary: FLU/CDJ   Nurse Visit    Prior Medications: PROAIR HFA 108 (90 BASE) MCG/ACT AERS (ALBUTEROL SULFATE) prn SINGULAIR 10 MG TABS (MONTELUKAST SODIUM) 1 daily LASIX 40 MG TABS (FUROSEMIDE) 1 by mouth qd CALCIUM WITH VITAMIN D 600MG  () 1 daily PRAVACHOL 40 MG  TABS (PRAVASTATIN SODIUM) 1 qHS THEOPHYLLINE CR 200 MG  TB12 (THEOPHYLLINE) 1 every 12hrs. ALPRAZOLAM 0.5 MG  TABS (ALPRAZOLAM) prn COZAAR 50 MG  TABS (LOSARTAN POTASSIUM) 1/2 daily MECLIZINE HCL 25 MG  TABS (MECLIZINE HCL) 1/2 -1 q 8 hours prn ALIGN   CAPS (MISC INTESTINAL FLORA REGULAT) 1 by mouth once daily MENTAX 1 %  CREA (BUTENAFINE HCL) apply two times a day prn VERAPAMIL HCL 80 MG  TABS (VERAPAMIL HCL) 1 q 8-12 hrs as needed tremor or headaches TRAMADOL HCL 50 MG  TABS (TRAMADOL HCL) 1 q 6 hrs as needed ha or back pain Current Allergies: ! * ASA BASED MEDS ! ZOCOR    Orders Added: 1)  Flu Vaccine 40yrs + [16109] 2)  Admin of Therapeutic Inj (IM or [place]) [[address]]   Flu Vaccine Consent Questions     Do you have a history of severe allergic reactions to this vaccine? no    Any prior history of allergic reactions to egg and/or gelatin? no    Do you have a sensitivity to the preservative Thimersol? no    Do you have a past history of Guillan-Barre Syndrome? no    Do you currently have an acute febrile illness? no    Have you ever had a severe reaction to latex? no    Vaccine information given and explained to patient? yes    Are you currently pregnant? no    Lot Number:[id number]   Site Given  Left Deltoid IM.opcflu

## 2010-07-16 NOTE — Assessment & Plan Note (Signed)
Summary: YEARLY EXAM AND FASTING LABS///SPH   Vital Signs:  Patient profile:   65 year old female Height:      63.25 inches Weight:      143.6 pounds BMI:     25.33 O2 Sat:      89 % on Room air Temp:     97.8 degrees F oral Pulse rate:   92 / minute Resp:     16 per minute BP sitting:   118 / 70  (left arm) Cuff size:   regular  Vitals Entered By: Shonna Chock CMA (June 05, 2010 8:27 AM)  O2 Flow:  Room air CC: CPX with fasting labs , COPD follow-up, Lipid Management   Primary Care Provider:  Marga Melnick, MD  CC:  CPX with fasting labs , COPD follow-up, and Lipid Management.  History of Present Illness: Here for Medicare AWV: 1.Risk factors based on Past M, S, F history:see Diagnoses ( chart updated) 2.Physical Activities: water aerobics, walking , weights 4-5X/week > 60 min 3.Depression/mood: no issues 4.Hearing: whisper heard @ 6 ft 5.ADL's:no limitations  6.Fall Risk: no imbalance 7.Home Safety: home  safety proofed 8.Height, weight, &visual acuity:wall chart read @ 6 ft w/o lenses 9.Counseling: POA & Living Will in place 10.Labs ordered based on risk factors: see Orders 11. Referral Coordination; none requested 12.  Care Plan:see Instructions 13. Cognitive Assessment: Oriented x 3; memory & recall  excellent ;"WORLD" spelles backwards; mood & affect normal  COPD Follow-Up       The patient denies shortness of breath, chest tightness, wheezing, cough, increased sputum, nocturnal awakening, and exercise induced symptoms.  Medication use includes quick relief med weekly and controller med daily, Symbicort.  Triggers are second hand smoke & perfume. Hypertension Follow-Up        The patient reports edema ( controlled by Dalbert Garnet) , but denies lightheadedness, urinary frequency, headaches, and fatigue.  Associated symptoms include occasional  palpitations.  The patient denies the following associated symptoms: chest pain, chest pressure, exercise intolerance, and  syncope.  Compliance with medications (by patient report) has been near 100%.  Adjunctive measures currently used by the patient include salt restriction.  BP @ home 120/70-80. Hyperlipidemia Follow-Up      The patient denies muscle aches, GI upset, abdominal pain, constipation, and diarrhea.  Compliance with medications (by patient report) has been near 100%.  Adjunctive measures currently used by the patient include fiber and folic acid.    Lipid Management History:      Positive NCEP/ATP III risk factors include female age 1 years old or older, family history for ischemic heart disease (males less than 46 years old), hypertension, and peripheral vascular disease.  Negative NCEP/ATP III risk factors include no history of early menopause without estrogen hormone replacement, non-diabetic, HDL cholesterol greater than 60, non-tobacco-user status, no ASHD (atherosclerotic heart disease), no prior stroke/TIA, and no history of aortic aneurysm.     Preventive Screening-Counseling & Management  Alcohol-Tobacco     Alcohol drinks/day: 2     Smoking Status: quit > 6 months     Packs/Day: 1.0     Year Started: 1970     Year Quit: 1990  Caffeine-Diet-Exercise     Caffeine use/day: none  Hep-HIV-STD-Contraception     Dental Visit-last 6 months yes     Sun Exposure-Excessive: no  Safety-Violence-Falls     Seat Belt Use: yes     Smoke Detectors: yes      Blood Transfusions:  no.  Travel History:  2006 [place].    Current Medications (verified): 1)  Proair Hfa 108 (90 Base) Mcg/act Aers (Albuterol Sulfate) .... Prn 2)  Singulair 10 Mg Tabs (Montelukast Sodium) .Marland Kitchen.. 1 Daily 3)  Lasix 40 Mg Tabs (Furosemide) .Marland Kitchen.. 1 By Mouth Qd 4)  Calcium With Vitamin D 600mg  .... 1 Daily 5)  Pravachol 40 Mg  Tabs (Pravastatin Sodium) .... 1/2 At Bedtime Due To Mild Lft Elevation 6)  Theophylline Cr 200 Mg  Tb12 (Theophylline) .Marland Kitchen.. 1 Every 12hrs. 7)  Cozaar 50 Mg  Tabs (Losartan Potassium) ....  1/2 Daily 8)  Align   Caps (Misc Intestinal Flora Regulat) .Marland Kitchen.. 1 By Mouth Once Daily 9)  Mentax 1 %  Crea (Butenafine Hcl) .... Apply Two Times A Day Prn 10)  Tramadol Hcl 50 Mg  Tabs (Tramadol Hcl) .Marland Kitchen.. 1 Q 6 Hrs As Needed Ha or Back Pain 11)  Diazepam 5 Mg Tabs (Diazepam) .... 1/2 - 1 Q 8 Hrs As Needed Dizziness 12)  Symbicort 160-4.5 Mcg/act Aero (Budesonide-Formoterol Fumarate) .Marland Kitchen.. 1-2 Puffs Every 12 Hrs ; Gargle & Spit After Use  Allergies: 1)  ! Jonne Ply Based Meds 2)  ! Zocor 3)  ! 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 Framingham Study LDL goal =< 100. Peripheral vascular disease: carotid bruits Colonic polyps, PMH  of, adenomatous, 06/2005, neg 2009, Dr Russella Dar Osteopenia : T score  -1.1 @ spine; -2.3 @ hip Hypertension Allergies Diverticulosis Hemorrhoids Anal Fissure 1970  Past Surgical History: plastic surgery (eyelid surgery) Colon polypectomy 2007, Dr Claudette Head Hysterectomy & BSO &  incidental appendectomy  1973 for dysfunctional bleeding (done  in  [place]), now seeing  Dr Aldona Bar  Lumbar laminectomy 2002, Dr Venetia Maxon Tonsillectomy R shoulder surgery 03/2010, Dr Eulah Pont  Family History: Father:  MI @ 56 Mother: Parkinson's Disease Siblings: bro: d MI @ 48;3 sisters: COAD ( emphysema); 2 sisters :DM (1 IDDM)  Social History: Former Smoker, quit 1990; low fat Married Alcohol use-yes: 2 glasses wine / night Regular exercise-yes: 30 min on Elliptical, water aerobics Smoking Status:  quit > 6 months Packs/Day:  1.0 Caffeine use/day:  none Dental Care w/in 6 mos.:  yes Sun Exposure-Excessive:  no Seat Belt Use:  yes Blood Transfusions:  no  Review of Systems  The patient denies anorexia, fever, vision loss, decreased hearing, hoarseness, hemoptysis, melena, hematochezia, severe indigestion/heartburn, hematuria,  suspicious skin lesions, abnormal bleeding, enlarged lymph nodes, and angioedema.    Physical Exam  General:  well-nourished;alert,appropriate and cooperative throughout examination Head:  Normocephalic and atraumatic without obvious abnormalities.  Eyes:  No corneal or conjunctival inflammation noted. Slight arcus senilis.Perrla. Funduscopic exam benign, without hemorrhages, exudates or papilledema. Ears:  External ear exam shows no significant lesions or deformities.  Otoscopic examination reveals clear canals, tympanic membranes are intact bilaterally without bulging, retraction, inflammation or discharge. Hearing is grossly normal bilaterally. Nose:  External nasal examination shows no deformity or inflammation. Nasal mucosa are pink and moist without lesions or exudates. Mouth:  Oral mucosa and oropharynx without lesions or exudates.  Teeth in good repair. Neck:  No deformities, masses, or tenderness noted. Lungs:  Normal respiratory effort, chest expands symmetrically. Lungs are clear to auscultation, no crackles or wheezes but decreased BS . Heart:  Normal rate and regular rhythm. S1 and S2 normal without gallop, murmur,  click, rub. S4 Abdomen:  Bowel sounds positive,abdomen soft and non-tender without masses, organomegaly or hernias noted. Genitalia:  Dr Aldona Bar Msk:  No deformity or scoliosis noted of thoracic or lumbar spine.   Pulses:  R and L carotid,radial,dorsalis pedis and posterior tibial pulses are full and equal bilaterally Extremities:  Slight  clubbing &  cyanosis suggested. No  edema. Minimal DIP OA changes Neurologic:  alert & oriented X3 and DTRs symmetrical and normal.   Skin:  Intact without suspicious lesions or rashes Cervical Nodes:  No lymphadenopathy noted Axillary Nodes:  No palpable lymphadenopathy Psych:  memory intact for recent and remote, normally interactive, and good eye contact.     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 Prn    Singulair 10 Mg Tabs (Montelukast sodium) .Marland Kitchen... 1 daily    Theophylline Cr 200 Mg Tb12 (Theophylline) .Marland Kitchen... 1 every 12hrs.    Symbicort 160-4.5 Mcg/act Aero (Budesonide-formoterol fumarate) .Marland Kitchen... 1-2 puffs every 12 hrs ; gargle & spit after use  Orders: Misc. Referral (Misc. Ref) T-2 View CXR (71020TC) Venipuncture (44010)  Problem # 8:  UNSPECIFIED ESSENTIAL HYPERTENSION (ICD-401.9)  controlled Her updated medication list for this problem includes:    Lasix 40 Mg Tabs (Furosemide) .Marland Kitchen... 1 by mouth qd    Cozaar 50 Mg Tabs (Losartan potassium) .Marland Kitchen... 1/2 daily  Orders: Medicare -1st Annual Wellness Visit [phone]) EKG w/ Interpretation (93000) Venipuncture (66440) TLB-BMP (Basic Metabolic  Panel-BMET) (80048-METABOL)  Complete Medication List: 1)  Proair Hfa 108 (90 Base) Mcg/act Aers (Albuterol sulfate) .... Prn 2)  Singulair 10 Mg Tabs (Montelukast sodium) .Marland Kitchen.. 1 daily 3)  Lasix 40 Mg Tabs (Furosemide) .Marland Kitchen.. 1 by mouth qd 4)  Calcium With Vitamin D 600mg   .... 1 daily 5)  Pravachol 40 Mg Tabs (Pravastatin sodium) .... 1/2 at bedtime due to mild lft elevation 6)  Theophylline Cr 200 Mg Tb12 (Theophylline) .Marland Kitchen.. 1 every 12hrs. 7)  Cozaar 50 Mg Tabs (Losartan potassium) .... 1/2 daily 8)  Align Caps (Misc intestinal flora regulat) .Marland Kitchen.. 1 by mouth once daily 9)  Mentax 1 % Crea (Butenafine hcl) .... Apply two times a day prn 10)  Tramadol Hcl 50 Mg Tabs (Tramadol hcl) .Marland Kitchen.. 1 q 6 hrs as needed ha or back pain 11)  Diazepam 5 Mg Tabs (Diazepam) .... 1/2 - 1 q 8 hrs as needed dizziness 12)  Symbicort 160-4.5 Mcg/act Aero (Budesonide-formoterol fumarate) .Marland Kitchen.. 1-2 puffs every 12 hrs ; gargle & spit after use  Other Orders: Pneumococcal Vaccine (16109) Admin 1st Vaccine (60454)  Lipid Assessment/Plan:      Based on NCEP/ATP III, the patient's risk factor category is "history of coronary disease, peripheral vascular disease, cerebrovascular disease, or aortic aneurysm".  The patient's lipid goals are as follows: Total cholesterol goal is 200; LDL cholesterol goal is 100; HDL cholesterol goal is 40; Triglyceride goal is 150.  Her LDL cholesterol goal has been met.    Patient Instructions: 1)  Follow up will be scheduled after labs, Xrays & studies completed. 2)  Take an  81 mg coated Aspirin every day. BMD every 25 months. Change in meds will be determined by pending labs. Avoid excess stimulants . Please complete Doppler of carotids & Lung Function Studies. Colonoscopy as per GI . Prescriptions: SYMBICORT 160-4.5 MCG/ACT AERO (BUDESONIDE-FORMOTEROL FUMARATE) 1-2 puffs every 12 hrs ; gargle & spit after use  #1 x 11   Entered and Authorized by:   Marga Melnick MD   Signed by:    Marga Melnick MD on 06/05/2010   Method used:   Print then Give to Patient   RxID:   [id number] DIAZEPAM 5 MG TABS (DIAZEPAM) 1/2 - 1 q 8 hrs as needed dizziness  #30 x 5   Entered and Authorized by:   Marga Melnick MD   Signed by:   Marga Melnick MD on 06/05/2010   Method used:   Print then Give to Patient   RxID:   [id number] TRAMADOL HCL 50 MG  TABS (TRAMADOL HCL) 1 q 6 hrs as needed ha or back pain  #60 x 5   Entered and Authorized by:   Marga Melnick MD   Signed by:   Marga Melnick MD on 06/05/2010   Method used:   Print then Give to Patient   RxID:   [id number] COZAAR 50 MG  TABS (LOSARTAN POTASSIUM) 1/2 daily  #90 x 3   Entered and Authorized by:   Marga Melnick MD   Signed by:   Marga Melnick MD on 06/05/2010   Method used:   Print then Give to Patient   RxID:   [id number] THEOPHYLLINE CR 200 MG  TB12 (THEOPHYLLINE) 1 every 12hrs.  #180 x 3   Entered and Authorized by:   Marga Melnick MD   Signed by:   Marga Melnick MD on 06/05/2010   Method used:   Print then Give to Patient   RxID:   [id number] PRAVACHOL 40 MG  TABS (PRAVASTATIN SODIUM) 1/2 at bedtime due to mild LFT elevation  #90 x 1   Entered and Authorized by:   Marga Melnick MD   Signed by:   Marga Melnick MD on 06/05/2010   Method used:   Print then Give to Patient   RxID:   [id number] LASIX 40 MG TABS (FUROSEMIDE) 1 by mouth qd  #90 Tablet x 3   Entered and Authorized by:   Marga Melnick MD   Signed by:   Marga Melnick MD on 06/05/2010   Method used:   Print then Give to Patient   RxID:   [id number] SINGULAIR 10 MG TABS (MONTELUKAST SODIUM) 1 daily  #90 Tablet x 3   Entered and Authorized by:   Marga Melnick MD   Signed by:   Marga Melnick MD on 06/05/2010   Method used:   Print then Give to Patient   RxID:   [id number] PROAIR HFA 108 (90 BASE) MCG/ACT AERS (ALBUTEROL SULFATE) prn  #25.5 x 5   Entered and  Authorized by:   Marga Melnick MD   Signed  by:   Marga Melnick MD on 06/05/2010   Method used:   Print then Give to Patient   RxID:   [id number]    Orders Added: 1)  Est. Patient Level III [57846] 2)  Medicare -1st Annual Wellness Visit [G0438] 3)  EKG w/ Interpretation [93000] 4)  Misc. Referral [Misc. Ref] 5)  T-2 View CXR [71020TC] 6)  Venipuncture [36415] 7)  TLB-Lipid Panel [80061-LIPID] 8)  TLB-BMP (Basic Metabolic Panel-BMET) [80048-METABOL] 9)  TLB-CBC Platelet - w/Differential [85025-CBCD] 10)  TLB-Hepatic/Liver Function Pnl [80076-HEPATIC] 11)  TLB-TSH (Thyroid Stimulating Hormone) [84443-TSH] 12)  T-Vitamin D (25-Hydroxy) [96295-28413] 13)  Doppler Referral [Doppler] 14)  Pneumococcal Vaccine [90732] 15)  Admin 1st Vaccine [90471] 16)  Specimen Handling [99000]   Immunizations Administered:  Pneumonia Vaccine:    Vaccine Type: Pneumovax (Medicare)    Site: left deltoid    Mfr: Merck    Dose: 0.5 ml    Route: IM    Given by: Shonna Chock CMA    Exp. Date: 10/14/2011    Lot #: [id number]   Immunizations Administered:  Pneumonia Vaccine:    Vaccine Type: Pneumovax (Medicare)    Site: left deltoid    Mfr: Merck    Dose: 0.5 ml    Route: IM    Given by: Shonna Chock CMA    Exp. Date: 10/14/2011    Lot #: [id number]

## 2010-10-05 ENCOUNTER — Other Ambulatory Visit (INDEPENDENT_AMBULATORY_CARE_PROVIDER_SITE_OTHER): Payer: Medicare Other

## 2010-10-05 DIAGNOSIS — R7401 Elevation of levels of liver transaminase levels: Secondary | ICD-10-CM

## 2010-10-05 DIAGNOSIS — R7402 Elevation of levels of lactic acid dehydrogenase (LDH): Secondary | ICD-10-CM

## 2010-10-05 DIAGNOSIS — E785 Hyperlipidemia, unspecified: Secondary | ICD-10-CM

## 2010-10-05 LAB — HEPATIC FUNCTION PANEL
ALT: 47 U/L — ABNORMAL HIGH (ref 0–35)
AST: 49 U/L — ABNORMAL HIGH (ref 0–37)
Albumin: 4.4 g/dL (ref 3.5–5.2)
Alkaline Phosphatase: 102 U/L (ref 39–117)
Bilirubin, Direct: 0.1 mg/dL (ref 0.0–0.3)
Total Bilirubin: 0.5 mg/dL (ref 0.3–1.2)
Total Protein: 7.5 g/dL (ref 6.0–8.3)

## 2010-10-06 LAB — VITAMIN D 25 HYDROXY (VIT D DEFICIENCY, FRACTURES): Vit D, 25-Hydroxy: 44 ng/mL (ref 30–89)

## 2010-10-27 NOTE — Assessment & Plan Note (Signed)
[facility]                            CARDIOLOGY OFFICE NOTE   NAME:Dugger, NERA HAWORTH                         MRN:          [id number]  DATE:02/01/2008                            DOB:          04-Oct-1945    Ms. Howland is seen today as a new patient.  She is 65 years old.  She has  a history of mitral valve prolapse, palpitations, question left carotid  bruit.  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   However, after review of the exam and also the impressions by Dr.  Excell Seltzer, there was no plaque formation bilaterally.  The stenosis was  called based on velocity criteria.   The patient really does not have 40-59% bilateral disease.  Her mid ICA  velocity was 1.19 cm/sec.  Her mid left velocity was 1.21 cm/sec.  However, in the setting of no plaque formation by 2-D examination, I  would have read these as 0-39% and explained the velocities as being  elevated for nonstenotic reasons.  I explained all these to [facility].  She  has not had any significant TIAs.  She has had a recurrence of her  migraines, which is being worked up by Dr. Alwyn Ren.  She apparently had  an MRI and CT, which showed no significant pathology.  She had migraines  back in the 80s, and they have recurred recently.  I do not think her  carotid exam has anything to do with this.   She also carries a diagnosis of mitral valve prolapse, and this was made  by Dr. Ladona Ridgel.  We have not done an echo on her here.  She has been doing  endocarditis prophylaxis.  It does not sound like true MVP and there was  initially diagnosis made for palpitations.   She has not had any significant recent chest pain.  She had a normal  stress Myoview study done on June 21, 2007 with good LV function.   This was done for history of palpitations and rapid heart rate.   She does have some asthma, and I think some of her palpitations have to  do with the use of albuterol.   Her review of  systems is otherwise negative.  Again, her biggest concern  lately had been recurrence of her migraines.   Her past medical history is remarkable for hysterectomy, previous back  surgery, history of hypertension, history of allergies, asthma, and  history of MVP.   She is allergic to IVP DYE.   She is happily married.  She is a Futures trader.  Her husband is a patient  of mine.  She does not drink or smoke.  She does all activities of daily  living.   Family history is noncontributory.   Father did have heart disease in his 52s, but I am not sure what type.  Mother died at age 25.  Father died at age 35 of a heart attack.   CURRENT MEDICINES:  1. Lasix 40 a day.  2. Albuterol.  3. Singulair 10 a day.  4. Pravachol 40 a  day.  5. Maalox.  6. Dulcolax p.r.n.  7. Theophylline 300 b.i.d.  8. Cozaar 25 a day.   PHYSICAL EXAMINATION:  GENERAL:  Remarkable for a healthy-appearing  white female in no distress.  VITAL SIGNS:  Blood pressure is 130/80, pulse is 80 and regular,  respiratory rate 14, afebrile, weight is 137.  HEENT:  Unremarkable.  NECK:  Carotids have no bruit to my exam.  No lymphadenopathy,  thyromegaly, or JVP elevation.  LUNGS:  Clear.  Good diaphragmatic motion.  No wheezing.  CARDIAC:  S1, S2.  I do not hear MVP or a murmur.  PMI normal.  ABDOMEN:  Benign.  Bowel sounds positive.  No AAA.  No tenderness.  No  bruit.  No hepatosplenomegaly or hepatojugular reflux.  No tenderness.  EXTREMITIES:  Distal pulses are intact.  No edema.  NEURO:  Nonfocal.  SKIN:  Warm and dry.  MUSCULOSKELETAL:  No muscular weakness.   EKG is totally normal.   Her carotid PV study was reviewed.  Her Myoview study was reviewed.   IMPRESSION:  1. Elevated carotid velocity is not related to plaque formation.      Followup carotid duplex in 2 years.  I do not think this has      anything to do with any clinical symptoms.  I would not have      classified her as having 40-59% stenosis  since no plaque was      visualized.  Can have a baby aspirin a day.  2. Recurrent migraine headaches.  Follow up with Dr. Alwyn Ren.  I      suspect that she has had CT, MRI, and hopefully also MRA to rule      out any aneurysm formation.  Consider followup with [facility].  3. Significant allergies.  I wonder if theophylline may be the      etiology to her elevated cardiac output and velocities in the      carotid arteries, it is certainly a vasodilator.  Currently appears      stable.  Continue Singulair and p.r.n. albuterol.  4. History of mitral valve prolapse, does not have it clinically, does      not need subacute bacterial endocarditis prophylaxis.  No need for      followup echo.  5. History of palpitations, likely exacerbated by theophylline and      albuterol.  No evidence of paroxysmal supraventricular tachycardia      and I continue to monitor.  6. Hypertension, currently well controlled.  Continue current dose of      Lasix and Cozaar.   The patient will be seen on an as-needed basis, and I would not repeat  another duplex for 2-3 years unless there are new symptoms.     Noralyn Pick. Eden Emms, MD, [facility]  Electronically Signed    PCN/MedQ  DD: 02/01/2008  DT: 02/02/2008  Job #: [id number]   cc:   Titus Dubin. Alwyn Ren, MD,FACP,FCCP

## 2010-10-27 NOTE — Assessment & Plan Note (Signed)
[facility]                        [facility] OFFICE NOTE   NAME:Barabas, RONNETTA CURRINGTON                         MRN:          [id number]  DATE:11/02/2006                            DOB:          [birth year]    The patient was seen on Nov 02, 2006 for a medication refill. She also  has an acute problem, diarrhea which began on May 14. It has been  associated with cramping. She denies fever or sweats but has a chills.  It has progressed since the 14th.   On the 9th, she had nausea and vomiting after a meal; her husband ate  from the same plate and did not get sick.   PAST MEDICAL HISTORY:  Diverticulosis and colonoscopy in 2007. She also  had colon polyps and internal hemorrhoids.   She has had no significant travel. She denies any recent antibiotic  therapy. She has no sick pet. No one in her close circle of friends or  in her family has had a similar illness.   She is due for refills on albuterol; the guidelines defining  uncontrolled asthma were reviewed . She uses the albuterol basically  only before exercise. She states her reactive airway is well controlled.   She specifically denies paroxysmal nocturnal dyspnea or need for rescue  doses of this albuterol.   VITAL SIGNS:  Her weight is essentially stable at 135.6, temperature is  96.8, pulse 60, respirations 15, and blood pressure 100/64.  HEENT:  She has no icterus or jaundice.  LYMPH:  She has no lymphadenopathy about the head, neck or axilla.  NECK:  Thyroid has slightly asymmetric nodules.  CHEST:  Surprisingly clear.  HEART:  Heart sounds are slightly distant. She does have an S4.  ABDOMEN:  She has no organomegaly or masses. The abdomen is nontender.  EXTREMITIES:  She has no peripheral edema. The posterior tibial pulses  are slightly decreased.   She will be asked  to stay on clear liquids, avoid milk, dairy or  grease. Metronidazole will be prescribed along with Lomotil as Imodium  has not been a benefit.   I will ask her to hold her theophylline until the GI symptoms have  resolved. The albuterol will be refilled.    Titus Dubin. Alwyn Ren, MD,FACP,FCCP  Electronically Signed   [facility]/MedQ  DD: 11/02/2006  DT: 11/02/2006  Job #: [id number]

## 2010-10-30 NOTE — Op Note (Signed)
[place]  Patient:    Crystal [name], Crystal [name]                         MRN: [id number] Proc. Date: 10/18/00 Adm. 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  PROCEDURE:  Left L4-5 extraforaminal microdiskectomy with microdissection.  SURGEON:  Danae Orleans. Venetia Maxon, M.D.  ANESTHESIA:  General endotracheal anesthesia.  ESTIMATED BLOOD LOSS:  100 cc.  COMPLICATIONS:  None.  DISPOSITION:  To recovery room.  INDICATIONS:  Crystal [name] is a 65 year old woman with a left L4 radiculopathy, with a herniated lumbar disk at the L4-5 level on the left at the extraforaminal position.  She has had considerable pain and, despite protracted efforts at conservative management, has not obtained relief from her pain.  It was therefore elected to take her to surgery for decompression of the L4 nerve root extraforaminally.  DESCRIPTION OF PROCEDURE:  Crystal [name] was brought to the operating room. Following the satisfactory and uncomplicated induction of general endotracheal anesthesia and the placement of intravenous lines, the patient was turned to a prone position on the operating table on the Wilson frame.  Care was taken to pad and protect all soft tissue and bony prominences.  The lumbar region was then prepped and draped in the usual sterile fashion.  The area of planned incision was infiltrated with 0.25% Marcaine and 0.5% lidocaine with 1:200,000 epinephrine.  Incision was made in the midline from the bottom of the L3 spinous process to the top of the S1 spinous process, carried through adipose tissue to the lumbodorsal fascia, which was then incised in the left of midline.  Subperiosteal dissection was performed,  exposing the extraforaminal region, and a self-retaining Versatrac retractor was placed, exposing the transverse processes of L4 and L5.  Intraoperative x-ray confirmed a probe at the level of the L4 pedicle.  The [facility] Max drill and AM8 equivalent bur were then used to thin the pars interarticularis on the left, and bone resection was completed with Kerrison rongeur.  The intertransverse ligament was identified and incised and removed in a piecemeal fashion.  The L4 nerve root was identified as it coursed at the level of the L4 transverse process extraforaminally.  The nerve root was freed as it coursed extraforaminally, and the disk space at the L4-5 level was identified.  There was an annular rent which was identified, and there was a significant bulge of the L4-5 disk extraforaminally again at the L4 nerve root, but there did not appear to be any free fragments of herniated disk material.  The disk space was then incised with a 15 blade, and this was done with microdissection technique using the operative microscope, as was bone removal and identification of the L4 nerve.  The disk material was then removed in a piecemeal fashion using a variety of micro and 2 mm pituitary rongeurs.  The interspace was then cleared of the residual disk material using Epstein curettes, and some residual disk material was pushed away from the nerve under the inferior aspect of the L4 nerve.  The redundant annular edges were then cauterized with bipolar electrocautery.  The floor of the extraforaminal region was then palpated with a coronary dilator, and the nerve was felt to be well-decompressed as it extended extraforaminally.  The hemostasis was obtained with Gelfoam soaked in thrombin.  After copious irrigation with bacitracin and saline, the nerve root was then bathed in 80 mg of Depo-Medrol and 2 cc of fentanyl.  The self-retaining retractor was then removed, the microscope was taken out of  the field.  The lumbodorsal fascia was closed with 0 Vicryl sutures.  The subcutaneous tissues were reapproximated with 2-0 Vicryl interrupted inverted sutures.  The skin edges were reapproximated with interrupted 3-0 Vicryl subcuticular stitch.  The wound was dressed with benzoin and Steri-Strips, Telfa gauze, and tape.  The patient was extubated in the operating room and taken to the recovery room in stable and satisfactory condition, having tolerated the operation well.  Counts were correct at the end of the case. DD:  10/18/00 TD:  10/19/00 Job: [id number]

## 2010-10-30 NOTE — Assessment & Plan Note (Signed)
[facility]                          [facility] OFFICE NOTE   NAME:[name], Crystal KE                         MRN:          [id number]  DATE:01/18/2006                            DOB:          [birth year]    Crystal [name] was seen January 18, 2006, complaining of abdominal pain.  She was  originally seen January 04, 2006, with symptoms present for 3-4 weeks.  Actually, she stated she has had problems for over 10 years.  This has been  evaluated extensively in the past.  She stated that Danise Edge, MD, has  diagnosed nervous stomach.  The evaluation included multiple gallbladder  studies which were negative.  The pain was mainly in the right upper  quadrant, lasting several minutes, and better if she rubs it.  She did  have a gynecologic exam in April of this year, which was negative.   Previous colonoscopy has revealed diverticulosis and polyps and internal  hemorrhoids.   Her weight has dropped 2 pounds since that July 24 visit.  She continues to  have the discomfort, which tended to be somewhat substernal and dull,  radiating to the interscapular area.  It was worse at night when supine and  it woke her approximately 3 a.m. on the morning of the visit.  She describes  increased burping 30-120 minutes after eating.  It also occurs when she is  doing yoga maneuvers.  She denies any dysphagia or melena.   She has no cardiopulmonary symptoms except for some shortness of breath with  perfume or Lysol exposures.   She states that an upper endoscopy in 1999 had been negative.   She has no cardiopulmonary symptoms with water aerobics or Body Flow  exercises.  There was some erythema to the oropharynx. S he had a grade 1/2  to 1 systolic murmur.  Pedal pulses were slightly decreased.   EKG revealed rare PVC.  EKG was unchanged from December 2006.   Normal or negative studies included CBC and differential, hepatic profile,  amylase, lipase.  A CEA was  done but I had not ordered this.  This was also  normal.   If symptoms persist, then I will request referral to a gastroenterologist of  her choice.  She was given an antireflux sheet and AcipHex samples.  She was  encouraged not to eat within 2 hours of going to bed.                                   Titus Dubin. Alwyn Ren, MD, [facility]   [facility]/MedQ  DD:  01/21/2006  DT:  01/22/2006  Job #:  [id number]

## 2010-10-30 NOTE — H&P (Signed)
[place]  Patient:    MELANI, BRISBANE                         MRN: [id number] Adm. Date:  [id number] Attending:  Josie Saunders                         History and Physical  REASON FOR ADMISSION:  Extraforaminal disk herniation, L4-5, left, with left L4 radiculopathy.  HISTORY OF PRESENT ILLNESS:  Ms. Inice Sanluis is a 65 year old woman who initially presented to my office at the request of Dr. Estell Harpin for a neurosurgical consultation for left lower extremity pain and a herniated lumbar disk.  I initially saw her on September 05, 2000.  Ms. Kaylor has complained of pain which began in January after working out at [employer], doing a spin class, and then leg presses.  She says that since that time she has complained of left hip pain and pain into her left leg, along with tingling into the back of her calf.  She rates her pain at 7/10.  She has undergone physical therapy, including a TENS unit trial.  She says that this was not helpful.  She says that she ices behind her knee, and this helps her occasionally.  She also has performed deep water aerobics, and says that this helps her some.  She says she underwent a Medrol dosepak for six days, and this was not helpful.  She has taken Vioxx, and says that she cannot take this because it aggravates her irritable bowel syndrome and asthma in higher doses.  She describes that she does not have a lot of pain in her foot, or any right lower extremity complaints.  She does feel very frustrated, and feels that she is not getting any better.  She denies any bowel or bladder dysfunction.  She says that occasionally she has fallen, secondary to weakness.  She has cut back on her exercises from every day, seven days a week exercising, to no exercising over the past two weeks, and this has not lead to a resolution of her pain complaints.  Ms. Tripp underwent an MRI of her lumbar spine, which was performed  through [facility] on August 10, 2000, which shows that on the left at the L4-5 level there is an annular rent in the foraminal region just adjacent to the L4 nerve root, with effacement of the space around the L4 nerve root.  REVIEW OF SYSTEMS:  A detailed review of systems sheet was reviewed with the patient.  Pertinent positives include:  CARDIOVASCULAR:  High cholesterol, leg pain with walking.  RESPIRATORY:  She notes asthma.  Last chest x-ray was in August 2001.  MUSCULOSKELETAL:  She notes back pain.  All other systems are negative.  PAST MEDICAL HISTORY: 1. Significant for asthma. 2. Irritable bowel syndrome. 3. Elevated cholesterol.  PAST SURGICAL HISTORY:  Significant for a hysterectomy in 1973.  CURRENT MEDICATIONS: 1. Pravachol 40 mg q.d. for elevated cholesterol. 2. Lasix 40 mg q.d. for swelling. 3. Uniphyl 400 mg 1-1/2 tablets q.d. for asthma. 4. Ventolin p.r.n. before exercise. 5. Xanax 0.5 mg q.d. 6. Premarin 0.625 mg q.d. 7. Flovent q.12h. p.r.n. asthma. 8. Zanaflex b.i.d. for pain.  ALLERGIES:  No known drug allergies.  HEIGHT & WEIGHT:  She is 5 feet 4 inches tall, weighing 130 pounds.  FAMILY HISTORY:  Both parents are  deceased.  Father died at age 79 of a myocardial infarction.  Mother died at age 55 of a stroke.  She also had Parkinsons disease.  SOCIAL HISTORY:  Ms. Boyko is a nonsmoker, a social drinker of alcoholic beverages, and no history of substance abuse.  She is married.  She has two sons, both of whom are grown.  DIAGNOSTIC STUDIES:  As above.  PHYSICAL EXAMINATION:  GENERAL:  Ms. Lorenz is a pleasant, cooperative, middle-aged white female, in no acute distress.  HEENT:  Normocephalic, atraumatic.  Pupils equal, round, reactive to light. Extraocular muscles are intact.  Sclerae white.  Conjunctivae pink. Oropharynx benign.  Uvula midline.  NECK:  No masses, meningismus, deformities, tracheal deviation, jugular  venous distention, or carotid bruits.  There is normal cervical range of motion. Spurlings test is negative without reproducible radicular pain on turning the patients head to either side.  Lhermittes sign is not present, with axial compression.  RESPIRATORY:  There is normal respiratory effort with good intercostal function.  LUNGS:  Clear to auscultation.  There are no rales, rhonchi, or wheezes.  CARDIOVASCULAR:  The heart is a regular rate and rhythm to auscultation.  No murmurs are appreciated.  EXTREMITIES:  There is no cyanosis, clubbing, or edema.  There are palpable pedal pulses.  ABDOMEN:  Soft, nontender.  No hepatosplenomegaly appreciated or masses. There are active bowel sounds.  No guarding or rebound.  MUSCULOSKELETAL:  Ms. Magar walks with an antalgic gait, favoring her left lower extremity.  She has midline low back pain to palpation, and marked left sciatic notch discomfort.  She is able to walk on her heels and toes.  She is able to bend to touch her toes, but has discomfort in doing so on the left side.  She is able to squat easily on the right, but has difficulty doing so in the left lower extremity without assistance.  Straight leg raising is positive at 60 degrees on the left.  NEUROLOGIC:  The patient is oriented to time, person, and place.  She has good recall of both recent and remote memory with normal attention span and concentration.  The patient speaks with clear and fluent speech and exhibits normal language function and appropriate fund of knowledge.  Cranial nerve examination:  Pupils equal, round, reactive to light.  Extraocular movements are full.  Visual fields are full to confrontational testing.  Facial sensation and facial motor are intact and symmetric.  Hearing is intact to finger rub.  Palate is upgoing.  Shoulder shrug is symmetric.  Tongue protrudes in the midline.  Motor examination:  Motor strength is 5/5 in the bilateral deltoids,  biceps, triceps, hand grips, wrist extensors, and interosseous.  Motor strength is 5/5 in the lower extremities in the hip  flexion, extension, hamstring, plantar flexion, dorsiflexion, extensor hallucis longus, and right quadriceps.  Left quadriceps is 4 to 4+/5.  Sensory examination:  Decreased to pin sensation throughout her left lower extremity below the knee.  Deep tendon reflexes are 2 in the biceps, triceps, and brachial radialis, 3 at the right knee, 1 at the left knee, 2 at the ankles. Great toes are downgoing to plantar stimulation.  Cerebellar examination: Normal coordination in the upper and lower extremities, with normal rapid alternating movements.  Romberg test is negative.  IMPRESSION/RECOMMENDATIONS:  Ms. Wava Kildow is a 65 year old woman with significant left L4 radiculopathy.  While she does not have significant evidence of nerve root compression on her MRI, she has a marked  radiculopathy, and this is worrisome for a disk herniation with nerve root compression.  I recommended that she undergo a selective nerve root block at the L4 level on the left, and I will see her back after this is done, and I saw her back after this was done.  I gave her a prescription for hydrocodone for pain relief. She returned after the transforaminal nerve root block, and said that she did not get any better with that injection, although she did have a few hours of relief immediately following the injection.  She continued to complain of persistent sciatic notch discomfort, pain behind her left knee, had a decreased quadriceps reflex on the left, and otherwise full strength.  She notes difficulty squatting on the left side, no difficulty on the right side. She continues to complain of significant pain.  I discussed treatment options with Ms. Riccardi.  I have recommended, that in light of her persistent pain and weakness, as well as decreased knee reflex, that we go ahead with an extraforaminal  microdiskectomy at L4-5 on the left; however, we discussed the surgical procedure in detail, as well as the potential attendant risks and benefits.  I reviewed studies with the patient, and went over her physical examination.  I reviewed surgical models, and discussed the typical hospital course and operative and postoperative course, and the potential risks and benefits of the surgery.  The risks of the surgery were discussed in detail and include, but are not limited to, the risks of anesthesia, blood loss, the possibility of hemorrhage, infection, damage to the nerves, damage to the blood vessels, injury to the lumbar nerve root causing either temporary or permanent leg pain, numbness, or weakness.  There is the potential for spinal fluid lead from a dural tear.  There is potential for post-laminectomy spondylolisthesis, recurrent disk herniation quoted at approximately 10%, failure to relieve the pain, worsening of the pain, and need for further surgery.  Ms. Alsteen wished to go ahead with surgery, and this is set up for Oct 18, 2000. She knows that I am going to leave town, and wanted me to go ahead with surgery and understands that she will receive postoperative followup with one of my partners during some of her postoperative period. DD:  10/18/00 TD:  10/18/00 Job: [id number]

## 2010-11-03 ENCOUNTER — Encounter: Payer: Self-pay | Admitting: Internal Medicine

## 2010-11-21 ENCOUNTER — Encounter: Payer: Self-pay | Admitting: Internal Medicine

## 2010-11-25 ENCOUNTER — Encounter: Payer: Self-pay | Admitting: Internal Medicine

## 2010-11-25 ENCOUNTER — Ambulatory Visit (INDEPENDENT_AMBULATORY_CARE_PROVIDER_SITE_OTHER): Payer: Medicare Other | Admitting: Internal Medicine

## 2010-11-25 DIAGNOSIS — R7989 Other specified abnormal findings of blood chemistry: Secondary | ICD-10-CM

## 2010-11-25 DIAGNOSIS — E790 Hyperuricemia without signs of inflammatory arthritis and tophaceous disease: Secondary | ICD-10-CM

## 2010-11-25 DIAGNOSIS — E785 Hyperlipidemia, unspecified: Secondary | ICD-10-CM

## 2010-11-25 MED ORDER — ALLOPURINOL 100 MG PO TABS: 100.0000 mg | ORAL_TABLET | Freq: Every day | ORAL | Status: DC

## 2010-11-25 MED ORDER — PRAVASTATIN SODIUM 40 MG PO TABS: 40.0000 mg | ORAL_TABLET | Freq: Every day | ORAL | Status: DC

## 2010-11-25 NOTE — Patient Instructions (Addendum)
Please  schedule fasting Labs in 10-12 weeks :Lipids, hepatic panel, CK, uric acid (272.4, 995.20, 790.6)

## 2010-11-25 NOTE — Progress Notes (Signed)
  Subjective:    Patient ID: Crystal [name], female    DOB: [birth year], 65 y.o.   MRN: [id number]  HPI Dyslipidemia assessment: Prior Advanced Lipid Testing: NMR 2004: LDL goal = < 130.   Family history of premature CAD/ MI: bro & father .  Nutrition: heart healthy .  Exercise:> 60 min 5X/ week . Diabetes : A1c 5.1% . HTN: no. Smoking history  : quit 1991 .   Weight :  Stable . ROS: fatigue: yes( variable) ; chest pain : no ;claudication: no; palpitations: no; abd pain/bowel changes: constipation ; myalgias:no;  syncope : no ; memory loss: no;skin changes: no. Lab results reviewed :[facility] Panel: uric acid 8.6;LDL 149 (on Pravastatin 40 mg 1/2 daily); LpPLA2 276; TT genotype. Zetia an option .Marland Kitchen     Review of Systems     Objective:   Physical Exam General appearance is one of good health and nourishment.  Heart:  Normal rate and regular rhythm. S1 and S2 normal without gallop, murmur, click, rub or other extra sounds. Lungs:Chest clear to auscultation; no wheezes, rhonchi,rales ,or rubs present.No increased work of breathing. BS decreased.  All pulses intact without  bruits .No ischemic skin changes.          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

## 2011-02-09 ENCOUNTER — Other Ambulatory Visit: Payer: Self-pay | Admitting: Internal Medicine

## 2011-02-09 DIAGNOSIS — T887XXA Unspecified adverse effect of drug or medicament, initial encounter: Secondary | ICD-10-CM

## 2011-02-09 DIAGNOSIS — R7989 Other specified abnormal findings of blood chemistry: Secondary | ICD-10-CM

## 2011-02-09 DIAGNOSIS — E785 Hyperlipidemia, unspecified: Secondary | ICD-10-CM

## 2011-02-10 ENCOUNTER — Other Ambulatory Visit (INDEPENDENT_AMBULATORY_CARE_PROVIDER_SITE_OTHER): Payer: Medicare Other

## 2011-02-10 DIAGNOSIS — R7989 Other specified abnormal findings of blood chemistry: Secondary | ICD-10-CM

## 2011-02-10 DIAGNOSIS — E785 Hyperlipidemia, unspecified: Secondary | ICD-10-CM

## 2011-02-10 DIAGNOSIS — T887XXA Unspecified adverse effect of drug or medicament, initial encounter: Secondary | ICD-10-CM

## 2011-02-10 LAB — HEPATIC FUNCTION PANEL
ALT: 18 U/L (ref 0–35)
AST: 24 U/L (ref 0–37)
Albumin: 4.4 g/dL (ref 3.5–5.2)
Alkaline Phosphatase: 83 U/L (ref 39–117)
Bilirubin, Direct: 0 mg/dL (ref 0.0–0.3)
Total Bilirubin: 0.7 mg/dL (ref 0.3–1.2)
Total Protein: 7.3 g/dL (ref 6.0–8.3)

## 2011-02-10 LAB — LIPID PANEL
Cholesterol: 247 mg/dL — ABNORMAL HIGH (ref 0–200)
HDL: 119.8 mg/dL (ref >39.00)
Total CHOL/HDL Ratio: 2
Triglycerides: 50 mg/dL (ref 0.0–149.0)
VLDL: 10 mg/dL (ref 0.0–40.0)

## 2011-02-10 LAB — URIC ACID: Uric Acid, Serum: 6.5 mg/dL (ref 2.4–7.0)

## 2011-02-10 LAB — CK: Total CK: 28 U/L (ref 7–177)

## 2011-02-10 LAB — LDL CHOLESTEROL, DIRECT: Direct LDL: 121.3 mg/dL

## 2011-02-10 NOTE — Progress Notes (Signed)
Labs only

## 2011-03-02 ENCOUNTER — Other Ambulatory Visit: Payer: Self-pay

## 2011-03-02 MED ORDER — MONTELUKAST SODIUM 10 MG PO TABS: 10.0000 mg | ORAL_TABLET | Freq: Every day | ORAL | Status: DC

## 2011-03-26 ENCOUNTER — Other Ambulatory Visit: Payer: Self-pay | Admitting: Internal Medicine

## 2011-04-12 ENCOUNTER — Ambulatory Visit (INDEPENDENT_AMBULATORY_CARE_PROVIDER_SITE_OTHER): Payer: Medicare Other | Admitting: Internal Medicine

## 2011-04-12 ENCOUNTER — Encounter: Payer: Self-pay | Admitting: Internal Medicine

## 2011-04-12 DIAGNOSIS — J209 Acute bronchitis, unspecified: Secondary | ICD-10-CM

## 2011-04-12 DIAGNOSIS — J069 Acute upper respiratory infection, unspecified: Secondary | ICD-10-CM

## 2011-04-12 MED ORDER — HYDROCODONE-HOMATROPINE 5-1.5 MG/5ML PO SYRP: 5.0000 mL | ORAL_SOLUTION | Freq: Four times a day (QID) | ORAL | Status: AC | PRN

## 2011-04-12 MED ORDER — AMOXICILLIN-POT CLAVULANATE 875-125 MG PO TABS: 1.0000 | ORAL_TABLET | Freq: Two times a day (BID) | ORAL | Status: AC

## 2011-04-12 NOTE — Patient Instructions (Signed)
Plain Mucinex for thick secretions ;force NON dairy fluids for next 48 hrs. Use a Neti pot daily as needed for sinus congestion 

## 2011-04-12 NOTE — Progress Notes (Signed)
  Subjective:    Patient ID: Crystal [name], female    DOB: 03-28-1946, 65 y.o.   MRN: [id number]  HPI Respiratory tract infection Onset/symptoms:10/24 in [place] as  headache & ST Exposures (illness/environmental/extrinsic):ill travelers Progression of symptoms:productive cough Treatments/response:Claritin & Robitussin Present symptoms: Fever/chills/sweats:sweats @ night Frontal headache:yes Facial pain:no Nasal purulence:no Dental pain:no Lymphadenopathy:no Wheezing/shortness of breath:no Cough/sputum/hemoptysis:thick , white Pleuritic pain:no Associated extrinsic/allergic symptoms:itchy eyes/ sneezing:no Past medical history: asthma Smoking history:quit 1991      Review of Systems     Objective:   Physical Exam General appearance is of good health and nourishment; no acute distress or increased work of breathing is present.  No  lymphadenopathy about the head, neck, or axilla noted.   Eyes: No conjunctival inflammation or lid edema is present.   Ears:  External ear exam shows no significant lesions or deformities.  Otoscopic examination reveals wax bilaterally   Nose:  External nasal examination shows no deformity or inflammation. Nasal mucosa are pink and moist without lesions or exudates. No septal dislocation or dislocation.No obstruction to airflow.   Oral exam: Dental hygiene is good; lips and gums are healthy appearing.There is no oropharyngeal erythema or exudate noted.     Heart:  Normal rate and regular rhythm. S1 and S2 normal without gallop, murmur, click,rub.S4  Lungs:Chest clear to auscultation; no wheezes, rhonchi,rales ,or rubs present.No increased work of breathing. 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 Clinically no proximal present at this time  Plan: See  orders and recommendations.

## 2011-04-22 ENCOUNTER — Other Ambulatory Visit: Payer: Self-pay | Admitting: Internal Medicine

## 2011-05-18 ENCOUNTER — Other Ambulatory Visit: Payer: Self-pay | Admitting: Sports Medicine

## 2011-05-18 DIAGNOSIS — M79672 Pain in left foot: Secondary | ICD-10-CM

## 2011-05-19 ENCOUNTER — Ambulatory Visit
Admission: RE | Admit: 2011-05-19 | Discharge: 2011-05-19 | Disposition: A | Discharge status: Home / Self Care | Payer: Medicare Other | Source: Ambulatory Visit | Attending: Sports Medicine | Admitting: Sports Medicine

## 2011-05-19 DIAGNOSIS — M79672 Pain in left foot: Secondary | ICD-10-CM

## 2011-05-31 ENCOUNTER — Other Ambulatory Visit: Payer: Self-pay | Admitting: Internal Medicine

## 2011-06-01 MED ORDER — TRAMADOL HCL 50 MG PO TABS: 50.0000 mg | ORAL_TABLET | Freq: Four times a day (QID) | ORAL | Status: DC | PRN

## 2011-06-01 NOTE — Telephone Encounter (Signed)
RX sent

## 2011-06-07 ENCOUNTER — Ambulatory Visit (INDEPENDENT_AMBULATORY_CARE_PROVIDER_SITE_OTHER): Payer: Medicare Other | Admitting: Internal Medicine

## 2011-06-07 ENCOUNTER — Encounter: Payer: Self-pay | Admitting: Internal Medicine

## 2011-06-07 VITALS — BP 130/82 | HR 94 | Temp 97.6°F | Resp 12 | Ht 63.0 in | Wt 132.6 lb

## 2011-06-07 DIAGNOSIS — E785 Hyperlipidemia, unspecified: Secondary | ICD-10-CM

## 2011-06-07 DIAGNOSIS — M899 Disorder of bone, unspecified: Secondary | ICD-10-CM

## 2011-06-07 DIAGNOSIS — Z136 Encounter for screening for cardiovascular disorders: Secondary | ICD-10-CM

## 2011-06-07 DIAGNOSIS — J449 Chronic obstructive pulmonary disease, unspecified: Secondary | ICD-10-CM

## 2011-06-07 DIAGNOSIS — I10 Essential (primary) hypertension: Secondary | ICD-10-CM

## 2011-06-07 DIAGNOSIS — K219 Gastro-esophageal reflux disease without esophagitis: Secondary | ICD-10-CM

## 2011-06-07 DIAGNOSIS — J4489 Other specified chronic obstructive pulmonary disease: Secondary | ICD-10-CM

## 2011-06-07 DIAGNOSIS — M949 Disorder of cartilage, unspecified: Secondary | ICD-10-CM

## 2011-06-07 DIAGNOSIS — Z0000 Encounter for general adult medical examination without abnormal findings: Secondary | ICD-10-CM

## 2011-06-07 LAB — BASIC METABOLIC PANEL
BUN: 15 mg/dL (ref 6–23)
CO2: 32 mEq/L (ref 19–32)
Calcium: 9.6 mg/dL (ref 8.4–10.5)
Chloride: 94 mEq/L — ABNORMAL LOW (ref 96–112)
Creatinine, Ser: 0.6 mg/dL (ref 0.4–1.2)
GFR: 101.95 mL/min (ref >60.00)
Glucose, Bld: 93 mg/dL (ref 70–99)
Potassium: 3.5 mEq/L (ref 3.5–5.1)
Sodium: 136 mEq/L (ref 135–145)

## 2011-06-07 LAB — CBC WITH DIFFERENTIAL/PLATELET
Basophils Absolute: 0 10*3/uL (ref 0.0–0.1)
Basophils Relative: 0.4 % (ref 0.0–3.0)
Eosinophils Absolute: 0.1 10*3/uL (ref 0.0–0.7)
Eosinophils Relative: 1 % (ref 0.0–5.0)
HCT: 44.9 % (ref 36.0–46.0)
Hemoglobin: 15.4 g/dL — ABNORMAL HIGH (ref 12.0–15.0)
Lymphocytes Relative: 20.7 % (ref 12.0–46.0)
Lymphs Abs: 2.2 10*3/uL (ref 0.7–4.0)
MCHC: 34.2 g/dL (ref 30.0–36.0)
MCV: 98.2 fl (ref 78.0–100.0)
Monocytes Absolute: 1 10*3/uL (ref 0.1–1.0)
Monocytes Relative: 8.9 % (ref 3.0–12.0)
Neutro Abs: 7.5 10*3/uL (ref 1.4–7.7)
Neutrophils Relative %: 69 % (ref 43.0–77.0)
Platelets: 353 10*3/uL (ref 150.0–400.0)
RBC: 4.57 Mil/uL (ref 3.87–5.11)
RDW: 13.9 % (ref 11.5–14.6)
WBC: 10.9 10*3/uL — ABNORMAL HIGH (ref 4.5–10.5)

## 2011-06-07 LAB — TSH: TSH: 3.56 u[IU]/mL (ref 0.35–5.50)

## 2011-06-07 NOTE — Patient Instructions (Addendum)
Preventive Health Care: Exercise  30-45  minutes a day, 3-4 days a week. Walking is especially valuable in preventing Osteoporosis. Eat a low-fat diet with lots of fruits and vegetables, up to 7-9 servings per day. Consume less than 30 grams of sugar per day from foods & drinks with High Fructose Corn Syrup as # 1,2,3 or #4 on label. Alcohol If you drink, do it moderately - 1 drink per day or less.  Blood Pressure Goal  Ideally is an AVERAGE < 135/85. This AVERAGE should be calculated from @ least 5-7 BP readings taken @ different times of day on different days of week. You should not respond to isolated BP readings , but rather the AVERAGE for that week . BMD is due every 25 months ; please schedule

## 2011-06-07 NOTE — Assessment & Plan Note (Signed)
Premature MI in her father & brother. LDL is @ goal of < 130.

## 2011-06-07 NOTE — Assessment & Plan Note (Signed)
BP well controlled by history. EKG reveals no hypertensive changes.

## 2011-06-07 NOTE — Assessment & Plan Note (Signed)
Last BMD in 2009 @ [facility]. She takes 3000 IU vit D daily;Vitamin D level needs to be assessed

## 2011-06-07 NOTE — Progress Notes (Signed)
Subjective:    Patient ID: Crystal [name], female    DOB: July 13, 1945, 65 y.o.   MRN: [id number]  HPI Medicare Wellness Visit:  The following psychosocial & medical history were reviewed as required by Medicare.   Social history: caffeine: 1 tea / day , alcohol:  2 glasses wine/ day ,  tobacco use : quit 1991  & exercise : water aerobics, weights or Pilates 6/week.   Home & personal  safety / fall risk: no issues, activities of daily living: no limitations , seatbelt use : yes , and smoke alarm employment : yes .  Power of Attorney/Living Will status : in place  Vision ( as recorded per Nurse) & Hearing  evaluation :  Last Ophth exam 01/12, cataract surgery in 2013. She wears 2 different contacts. Whisper heard @ 6 ft Orientation :oriented X 3  , memory & recall :good , spelling  testing: good ,and mood & affect : normal . 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 Chart reviewed &  Updated. Active issues reviewed & addressed.       Review of Systems CHRONIC HYPERTENSION: Disease Monitoring  Blood pressure range: 130/70-80  Chest pain: no   Dyspnea: no   Claudication: no Medication compliance: yes,   Medication Side Effects  Lightheadedness: no   Urinary frequency: no   Edema: minor  Preventitive Healthcare:  Diet Pattern: heart healthy  Salt Restriction: yes       Objective:   Physical Exam Gen.: Healthy and well-nourished in appearance. Alert, appropriate and cooperative throughout exam. Head: Normocephalic without obvious abnormalities Eyes: No corneal or conjunctival inflammation noted. Pupils equal round reactive to light and accommodation. Fundal exam is benign without hemorrhages, exudate, papilledema.  Extraocular motion intact. Distant vision OD could not be checked due to "near vision " lens Ears: External  ear exam reveals no significant lesions or deformities. Canals clear .TMs normal.  Nose: External nasal exam reveals no deformity or inflammation. Nasal mucosa are pink and moist. No lesions or exudates noted.  Mouth: Oral mucosa and oropharynx reveal no lesions or exudates. Teeth in good repair. Neck: No deformities, masses, or tenderness noted. Range of motion &. Thyroid normal. Lungs: Normal respiratory effort; chest expands symmetrically. Lungs are clear to auscultation without rales, wheezes, or increased work of breathing.BS decreased  Heart: Normal rate and rhythm. Normal S1 and S2. No gallop, click, or rub. S 4 w/o murmur. Abdomen: Bowel sounds normal; abdomen soft and nontender. No masses, organomegaly or hernias noted. Genitalia: Dr Aldona Bar   .                                                                                   Musculoskeletal/extremities: No deformity or scoliosis noted of  the thoracic or lumbar spine. No clubbing, cyanosis, edema, or deformity noted. Range of motion  normal .Tone & strength  normal.Joints normal. Nail health  good. Vascular:  Carotid, radial artery,&  dorsalis pedis  pulses are full and equal.faint L carotid bruit. Slightly decreased PTP Neurologic: Alert and oriented x3. Deep tendon reflexes symmetrical and normal.          Skin: Intact without suspicious lesions or rashes. Lymph: No cervical, axillary lymphadenopathy present. Psych: Mood and affect are normal. Normally interactive                                                                                         Assessment & Plan:  #1 Medicare Wellness Exam; criteria met ; data entered #2 Problem List reviewed ; Assessment/ Recommendations made Plan: see Orders

## 2011-06-08 LAB — THEOPHYLLINE LEVEL: Theophylline Lvl: 11 ug/mL (ref 10.0–20.0)

## 2011-06-08 LAB — VITAMIN D 25 HYDROXY (VIT D DEFICIENCY, FRACTURES): Vit D, 25-Hydroxy: 43 ng/mL (ref 30–89)

## 2011-06-15 HISTORY — PX: COLONOSCOPY: SHX174

## 2011-06-15 HISTORY — PX: CATARACT EXTRACTION W/ INTRAOCULAR LENS IMPLANT: SHX1309

## 2011-06-21 ENCOUNTER — Telehealth: Payer: Self-pay | Admitting: Internal Medicine

## 2011-06-21 ENCOUNTER — Other Ambulatory Visit: Payer: Self-pay | Admitting: Internal Medicine

## 2011-06-21 DIAGNOSIS — M949 Disorder of cartilage, unspecified: Secondary | ICD-10-CM

## 2011-06-21 DIAGNOSIS — M899 Disorder of bone, unspecified: Secondary | ICD-10-CM

## 2011-06-21 NOTE — Telephone Encounter (Signed)
Patient calling, requests Order for Bone Density.  Last was done with [facility], now [facility].

## 2011-06-28 ENCOUNTER — Encounter: Payer: Self-pay | Admitting: Internal Medicine

## 2011-06-28 ENCOUNTER — Ambulatory Visit (INDEPENDENT_AMBULATORY_CARE_PROVIDER_SITE_OTHER): Payer: Medicare Other | Admitting: Internal Medicine

## 2011-06-28 DIAGNOSIS — R7989 Other specified abnormal findings of blood chemistry: Secondary | ICD-10-CM

## 2011-06-28 DIAGNOSIS — I10 Essential (primary) hypertension: Secondary | ICD-10-CM

## 2011-06-28 DIAGNOSIS — E790 Hyperuricemia without signs of inflammatory arthritis and tophaceous disease: Secondary | ICD-10-CM | POA: Insufficient documentation

## 2011-06-28 NOTE — Progress Notes (Signed)
  Subjective:    Patient ID: Crystal [name], female    DOB: [birth year], 66 y.o.   MRN: [id number]  HPI Her COPD is stable on her present regimen. She questions increasing the theophylline dose as  level was low normal . I explained that there is a narrow risk/benefit ratio with this agent and this level would be preferred.   Additionally she questions need to change her vitamin D dose. Presently she is taking 1000 international units of vitamin D 3 as 3 pills daily. Despite this significant dose the level is in the low therapeutic range at 43. She is also taking calcium twice a day.  Her potassium is low normal at 3.5; she is on furosemide 40 mg daily. Furosemide is used to control edema. She states that she has increasing respiratory problems with fluid retention. Blood pressure is well controlled on the present regimen which includes an ARB. Spironolactone would not be an option. She has had elevated uric acid in the past; because of this and the low potassium, HCTZ is contraindicated.    Review of Systems she is wearing a walking boot on the left ankle for recent stress fracture     Objective:   Physical Exam   She appears healthy and well-nourished symptoms no increased work of breathing.  She has an S4 without significant murmurs or gallops.  Chest is clear without rhonchi, rales, or wheezing. Breath sounds generally decreased.  Trace edema is noted at the right ankle        Assessment & Plan:  #1 the present regimen seems to address the multiple factors involved. Dietary focus will be emphasized because of the low normal potassium.  #2 she has a history of hyperuricemia; uric acid will be checked to see if allopurinol dose can be adjusted.

## 2011-06-28 NOTE — Assessment & Plan Note (Signed)
Blood pressure is well controlled on present regimen. It is unlikely that spironolactone would adequately control her edema. Also it is relatively contraindicated with the ARB

## 2011-06-28 NOTE — Patient Instructions (Signed)
To increase  Potassium (K+) increase citrus fruits & bananas in diet and use the salt substitute No Salt, which contains  potassium , to season food @ the table.  

## 2011-06-29 LAB — URIC ACID: Uric Acid, Serum: 5.6 mg/dL (ref 2.4–7.0)

## 2011-06-29 LAB — HM DEXA SCAN

## 2011-07-01 ENCOUNTER — Other Ambulatory Visit: Payer: Self-pay | Admitting: Internal Medicine

## 2011-07-07 ENCOUNTER — Other Ambulatory Visit: Payer: Self-pay | Admitting: Internal Medicine

## 2011-07-11 ENCOUNTER — Encounter: Payer: Self-pay | Admitting: Internal Medicine

## 2011-07-12 ENCOUNTER — Other Ambulatory Visit: Payer: Self-pay | Admitting: Internal Medicine

## 2011-07-14 ENCOUNTER — Encounter: Payer: Self-pay | Admitting: Internal Medicine

## 2011-08-04 ENCOUNTER — Encounter: Payer: Self-pay | Admitting: Gastroenterology

## 2011-08-23 ENCOUNTER — Other Ambulatory Visit: Payer: Self-pay | Admitting: Internal Medicine

## 2011-08-23 NOTE — Telephone Encounter (Signed)
Prescription sent to pharmacy.

## 2011-09-01 ENCOUNTER — Encounter: Payer: Self-pay | Admitting: Gastroenterology

## 2011-09-29 ENCOUNTER — Other Ambulatory Visit: Payer: Self-pay | Admitting: Internal Medicine

## 2011-10-08 ENCOUNTER — Ambulatory Visit (AMBULATORY_SURGERY_CENTER): Payer: Medicare Other | Admitting: *Deleted

## 2011-10-08 ENCOUNTER — Encounter: Payer: Self-pay | Admitting: Gastroenterology

## 2011-10-08 VITALS — Ht 63.0 in | Wt 140.0 lb

## 2011-10-08 DIAGNOSIS — Z1211 Encounter for screening for malignant neoplasm of colon: Secondary | ICD-10-CM

## 2011-10-08 MED ORDER — PEG-KCL-NACL-NASULF-NA ASC-C 100 G PO SOLR: ORAL | Status: DC

## 2011-10-19 ENCOUNTER — Other Ambulatory Visit: Payer: Self-pay | Admitting: Sports Medicine

## 2011-10-19 DIAGNOSIS — M545 Low back pain, unspecified: Secondary | ICD-10-CM

## 2011-10-21 ENCOUNTER — Ambulatory Visit
Admission: RE | Admit: 2011-10-21 | Discharge: 2011-10-21 | Disposition: A | Discharge status: Home / Self Care | Payer: Medicare Other | Source: Ambulatory Visit | Attending: Sports Medicine | Admitting: Sports Medicine

## 2011-10-21 DIAGNOSIS — M545 Low back pain, unspecified: Secondary | ICD-10-CM

## 2011-10-21 MED ORDER — METHYLPREDNISOLONE ACETATE 40 MG/ML INJ SUSP (RADIOLOG
120.0000 mg | Freq: Once | INTRAMUSCULAR | Status: AC
  Administered 2011-10-21: 120 mg via EPIDURAL

## 2011-10-21 MED ORDER — IOHEXOL 180 MG/ML  SOLN
1.0000 mL | Freq: Once | INTRAMUSCULAR | Status: AC | PRN
  Administered 2011-10-21: 1 mL via EPIDURAL

## 2011-10-21 NOTE — Discharge Instructions (Signed)

## 2011-10-22 ENCOUNTER — Encounter: Payer: Medicare Other | Admitting: Gastroenterology

## 2011-10-26 ENCOUNTER — Ambulatory Visit (AMBULATORY_SURGERY_CENTER): Payer: Medicare Other | Admitting: Gastroenterology

## 2011-10-26 ENCOUNTER — Encounter: Payer: Self-pay | Admitting: Gastroenterology

## 2011-10-26 VITALS — BP 155/79 | HR 86 | Temp 98.5°F | Resp 18 | Ht 63.0 in | Wt 140.0 lb

## 2011-10-26 DIAGNOSIS — Z860100 Personal history of colon polyps, unspecified: Secondary | ICD-10-CM

## 2011-10-26 DIAGNOSIS — Z1211 Encounter for screening for malignant neoplasm of colon: Secondary | ICD-10-CM

## 2011-10-26 MED ORDER — SODIUM CHLORIDE 0.9 % IV SOLN: 500.0000 mL | INTRAVENOUS | Status: DC

## 2011-10-26 NOTE — Progress Notes (Signed)
Patient did not experience any of the following events: a burn prior to discharge; a fall within the facility; wrong site/side/patient/procedure/implant event; or a hospital transfer or hospital admission upon discharge from the facility. (G8907) Patient did not have preoperative order for IV antibiotic SSI prophylaxis. (G8918)  

## 2011-10-26 NOTE — Patient Instructions (Signed)
YOU HAD AN ENDOSCOPIC PROCEDURE TODAY AT THE [facility]: Refer to the procedure report that was given to you for any specific questions about what was found during the examination.  If the procedure report does not answer your questions, please call your gastroenterologist to clarify.  If you requested that your care partner not be given the details of your procedure findings, then the procedure report has been included in a sealed envelope for you to review at your convenience later.  YOU SHOULD EXPECT: Some feelings of bloating in the abdomen. Passage of more gas than usual.  Walking can help get rid of the air that was put into your GI tract during the procedure and reduce the bloating. If you had a lower endoscopy (such as a colonoscopy or flexible sigmoidoscopy) you may notice spotting of blood in your stool or on the toilet paper. If you underwent a bowel prep for your procedure, then you may not have a normal bowel movement for a few days.  DIET: Your first meal following the procedure should be a light meal and then it is ok to progress to your normal diet.  A half-sandwich or bowl of soup is an example of a good first meal.  Heavy or fried foods are harder to digest and may make you feel nauseous or bloated.  Likewise meals heavy in dairy and vegetables can cause extra gas to form and this can also increase the bloating.  Drink plenty of fluids but you should avoid alcoholic beverages for 24 hours.  ACTIVITY: Your care partner should take you home directly after the procedure.  You should plan to take it easy, moving slowly for the rest of the day.  You can resume normal activity the day after the procedure however you should NOT DRIVE or use heavy machinery for 24 hours (because of the sedation medicines used during the test).    SYMPTOMS TO REPORT IMMEDIATELY: A gastroenterologist can be reached at any hour.  During normal business hours, 8:30 AM to 5:00 PM Monday through Friday,  call [phone].  After hours and on weekends, please call the GI answering service at [phone] who will take a message and have the physician on call contact you.   Following lower endoscopy (colonoscopy or flexible sigmoidoscopy):  Excessive amounts of blood in the stool  Significant tenderness or worsening of abdominal pains  Swelling of the abdomen that is new, acute  Fever of 100F or higher    FOLLOW UP: If any biopsies were taken you will be contacted by phone or by letter within the next 1-3 weeks.  Call your gastroenterologist if you have not heard about the biopsies in 3 weeks.  Our staff will call the home number listed on your records the next business day following your procedure to check on you and address any questions or concerns that you may have at that time regarding the information given to you following your procedure. This is a courtesy call and so if there is no answer at the home number and we have not heard from you through the emergency physician on call, we will assume that you have returned to your regular daily activities without incident.  SIGNATURES/CONFIDENTIALITY: You and/or your care partner have signed paperwork which will be entered into your electronic medical record.  These signatures attest to the fact that that the information above on your After Visit Summary has been reviewed and is understood.  Full responsibility of the confidentiality   of this discharge information lies with you and/or your care-partner.     

## 2011-10-26 NOTE — Progress Notes (Signed)
Patient did not have preoperative order for IV antibiotic SSI prophylaxis. (G8918)  Patient did not experience any of the following events: a burn prior to discharge; a fall within the facility; wrong site/side/patient/procedure/implant event; or a hospital transfer or hospital admission upon discharge from the facility. (G8907)  

## 2011-10-26 NOTE — Op Note (Signed)
[facility] [address] [place], [place]  [address]  COLONOSCOPY PROCEDURE REPORT  PATIENT:  Crystal [name], Crystal [name]  MR#:  [id number] BIRTHDATE:  29-Jan-1946, 65 yrs. old  GENDER:  female ENDOSCOPIST:  Judie Petit T. Russella Dar, MD, [facility]  PROCEDURE DATE:  10/26/2011 PROCEDURE:  Colonoscopy 19147 ASA CLASS:  Class II INDICATIONS:  1) surveillance and high-risk screening  2) history of pre-cancerous (adenomatous) colon polyps: 06/2005, 07/2008 MEDICATIONS:   MAC sedation, administered by CRNA, propofol (Diprivan) 450 mg IV DESCRIPTION OF PROCEDURE:   After the risks benefits and alternatives of the procedure were thoroughly explained, informed consent was obtained.  Digital rectal exam was performed and revealed no abnormalities.   The LB [id number] endoscope was introduced through the anus and advanced to the cecum, which was identified by both the appendix and ileocecal valve, without limitations.  The quality of the prep was good, using MoviPrep. The instrument was then slowly withdrawn as the colon was fully examined. <<PROCEDUREIMAGES>> FINDINGS:  Moderate diverticulosis was found in the sigmoid to descending colon with spasm.  Otherwise normal colonoscopy without other polyps, masses, vascular ectasias, or inflammatory changes. Retroflexed views in the rectum revealed no abnormalities.    The time to cecum =  5.5  minutes. The scope was then withdrawn (time =  9.67  min) from the patient and the procedure completed.  COMPLICATIONS:  None  ENDOSCOPIC IMPRESSION: 1) Moderate diverticulosis in the sigmoid to descending colon  RECOMMENDATIONS: 1) High fiber diet with liberal fluid intake. 2) Repeat Colonoscopy in 5 years.  Venita Lick. Russella Dar, MD, [facility]  n. eSIGNED:   Venita Lick. Vallory Oetken at 10/26/2011 11:39 AM  Jacqualyn Posey, [phone]

## 2011-10-26 NOTE — Progress Notes (Deleted)
Per Dr. Russella Dar, pt is to have to 2 fleets enemas upon arrival.  1st fleets enema given at 1020 per Clinical research associate.  Pt instructed to hold solution as long as possible and to call when she uses to the restroom for 2nd enema to be given.

## 2011-10-27 ENCOUNTER — Telehealth: Payer: Self-pay | Admitting: *Deleted

## 2011-10-27 NOTE — Telephone Encounter (Signed)
  Follow up Call-  Call back number 10/26/2011  Post procedure Call Back phone  # [phone]  Permission to leave phone message Yes     Patient questions:  Do you have a fever, pain , or abdominal swelling? no Pain Score  0 *  Have you tolerated food without any problems? yes  Have you been able to return to your normal activities? yes  Do you have any questions about your discharge instructions: Diet   no Medications  no Follow up visit  no  Do you have questions or concerns about your Care? no  Actions: * If pain score is 4 or above: No action needed, pain <4.

## 2012-02-24 ENCOUNTER — Ambulatory Visit (INDEPENDENT_AMBULATORY_CARE_PROVIDER_SITE_OTHER): Payer: Medicare Other | Admitting: Internal Medicine

## 2012-02-24 ENCOUNTER — Encounter: Payer: Self-pay | Admitting: Internal Medicine

## 2012-02-24 VITALS — BP 112/70 | HR 95 | Temp 98.5°F | Wt 143.4 lb

## 2012-02-24 DIAGNOSIS — J209 Acute bronchitis, unspecified: Secondary | ICD-10-CM

## 2012-02-24 MED ORDER — AZITHROMYCIN 250 MG PO TABS: ORAL_TABLET | ORAL | Status: AC

## 2012-02-24 MED ORDER — HYDROCODONE-HOMATROPINE 5-1.5 MG/5ML PO SYRP: 5.0000 mL | ORAL_SOLUTION | Freq: Four times a day (QID) | ORAL | Status: AC | PRN

## 2012-02-24 NOTE — Progress Notes (Signed)
  Subjective:    Patient ID: Crystal [name], female    DOB: December 07, 1945, 66 y.o.   MRN: [id number]  HPI On Labor Day weekend she began to have sneezing and nonproductive cough without associated itchy watery eyes. She initially improved with Claritin but subsequently developed chest congestion and discomfort with cough in the left lower thorax as of 9/9.She has not had shortness of breath necessitating use of her rescue inhaler. Last night she noted low-grade fever up to 99.  She's also had some frontal pressure and slight sore throat    Review of Systems  She denies facial pain, significant pelvic pain or discharge, nasal purulence, or dental pain.     Objective:   Physical Exam General appearance:good health ;well nourished; no acute distress or increased work of breathing is present.  No  lymphadenopathy about the head, neck, or axilla noted.   Eyes: No conjunctival inflammation or lid edema is present.   Ears:  External ear exam shows no significant lesions or deformities.  Otoscopic examination reveals wax bilaterally   Nose:  External nasal examination shows no deformity or inflammation. Nasal mucosa are pink and moist without lesions or exudates. No septal dislocation or deviation.No obstruction to airflow.   Oral exam: Dental hygiene is good; lips and gums are healthy appearing.There is no oropharyngeal erythema or exudate noted.     Heart:  Normal rate and regular rhythm. S1 and S2 normal without gallop, murmur, click, rub or other extra sounds.   Lungs:Chest clear to auscultation; no wheezes, rhonchi,rales ,or rubs present.No increased work of breathing. Breath sounds are decreased   Extremities:  No cyanosis, edema, or clubbing  noted    Skin: Warm & dry           Assessment & Plan:  #1 acute bronchitis w/o bronchospasm Plan: See orders and recommendations

## 2012-02-24 NOTE — Patient Instructions (Addendum)
Plain Mucinex for thick secretions ;force NON dairy fluids . Use a Neti pot daily as needed for sinus congestion; going from open side to congested side . Nasal cleansing in the shower as discussed. Make sure that all residual soap is removed to prevent irritation. Plain Allegra 160 daily or Claritin 10 mg as needed for itchy eyes & sneezing.

## 2012-03-21 ENCOUNTER — Other Ambulatory Visit: Payer: Self-pay | Admitting: Internal Medicine

## 2012-03-21 NOTE — Telephone Encounter (Signed)
Refill done.  

## 2012-06-05 ENCOUNTER — Encounter: Payer: Self-pay | Admitting: Lab

## 2012-06-08 ENCOUNTER — Ambulatory Visit (INDEPENDENT_AMBULATORY_CARE_PROVIDER_SITE_OTHER): Payer: Medicare Other | Admitting: Internal Medicine

## 2012-06-08 ENCOUNTER — Encounter: Payer: Self-pay | Admitting: Internal Medicine

## 2012-06-08 VITALS — BP 122/78 | HR 91 | Temp 97.5°F | Resp 12 | Ht 63.0 in | Wt 138.8 lb

## 2012-06-08 DIAGNOSIS — J449 Chronic obstructive pulmonary disease, unspecified: Secondary | ICD-10-CM

## 2012-06-08 DIAGNOSIS — M949 Disorder of cartilage, unspecified: Secondary | ICD-10-CM

## 2012-06-08 DIAGNOSIS — Z86010 Personal history of colonic polyps: Secondary | ICD-10-CM

## 2012-06-08 DIAGNOSIS — E785 Hyperlipidemia, unspecified: Secondary | ICD-10-CM

## 2012-06-08 DIAGNOSIS — Z23 Encounter for immunization: Secondary | ICD-10-CM

## 2012-06-08 DIAGNOSIS — M899 Disorder of bone, unspecified: Secondary | ICD-10-CM

## 2012-06-08 DIAGNOSIS — Z0000 Encounter for general adult medical examination without abnormal findings: Secondary | ICD-10-CM

## 2012-06-08 DIAGNOSIS — I10 Essential (primary) hypertension: Secondary | ICD-10-CM

## 2012-06-08 LAB — HEPATIC FUNCTION PANEL
ALT: 20 U/L (ref 0–35)
AST: 21 U/L (ref 0–37)
Albumin: 4.5 g/dL (ref 3.5–5.2)
Alkaline Phosphatase: 87 U/L (ref 39–117)
Bilirubin, Direct: 0 mg/dL (ref 0.0–0.3)
Total Bilirubin: 0.6 mg/dL (ref 0.3–1.2)
Total Protein: 8.1 g/dL (ref 6.0–8.3)

## 2012-06-08 LAB — CBC WITH DIFFERENTIAL/PLATELET
Basophils Absolute: 0.1 10*3/uL (ref 0.0–0.1)
Basophils Relative: 0.6 % (ref 0.0–3.0)
Eosinophils Absolute: 0.1 10*3/uL (ref 0.0–0.7)
Eosinophils Relative: 0.9 % (ref 0.0–5.0)
HCT: 44.7 % (ref 36.0–46.0)
Hemoglobin: 14.9 g/dL (ref 12.0–15.0)
Lymphocytes Relative: 19.9 % (ref 12.0–46.0)
Lymphs Abs: 2.1 10*3/uL (ref 0.7–4.0)
MCHC: 33.5 g/dL (ref 30.0–36.0)
MCV: 98.7 fl (ref 78.0–100.0)
Monocytes Absolute: 0.8 10*3/uL (ref 0.1–1.0)
Monocytes Relative: 7.7 % (ref 3.0–12.0)
Neutro Abs: 7.6 10*3/uL (ref 1.4–7.7)
Neutrophils Relative %: 70.9 % (ref 43.0–77.0)
Platelets: 333 10*3/uL (ref 150.0–400.0)
RBC: 4.52 Mil/uL (ref 3.87–5.11)
RDW: 14.3 % (ref 11.5–14.6)
WBC: 10.7 10*3/uL — ABNORMAL HIGH (ref 4.5–10.5)

## 2012-06-08 LAB — LIPID PANEL
Cholesterol: 305 mg/dL — ABNORMAL HIGH (ref 0–200)
HDL: 133.5 mg/dL (ref >39.00)
Total CHOL/HDL Ratio: 2
Triglycerides: 61 mg/dL (ref 0.0–149.0)
VLDL: 12.2 mg/dL (ref 0.0–40.0)

## 2012-06-08 LAB — TSH: TSH: 2.56 u[IU]/mL (ref 0.35–5.50)

## 2012-06-08 LAB — BASIC METABOLIC PANEL
BUN: 15 mg/dL (ref 6–23)
CO2: 31 mEq/L (ref 19–32)
Calcium: 9.1 mg/dL (ref 8.4–10.5)
Chloride: 94 mEq/L — ABNORMAL LOW (ref 96–112)
Creatinine, Ser: 0.7 mg/dL (ref 0.4–1.2)
GFR: 96.25 mL/min (ref >60.00)
Glucose, Bld: 97 mg/dL (ref 70–99)
Potassium: 3.4 mEq/L — ABNORMAL LOW (ref 3.5–5.1)
Sodium: 137 mEq/L (ref 135–145)

## 2012-06-08 LAB — LDL CHOLESTEROL, DIRECT: Direct LDL: 162 mg/dL

## 2012-06-08 MED ORDER — SINGULAIR 10 MG PO TABS: ORAL_TABLET | ORAL | Status: DC

## 2012-06-08 MED ORDER — TRAMADOL HCL 50 MG PO TABS: 50.0000 mg | ORAL_TABLET | Freq: Four times a day (QID) | ORAL | Status: DC | PRN

## 2012-06-08 MED ORDER — BUDESONIDE-FORMOTEROL FUMARATE 160-4.5 MCG/ACT IN AERO: INHALATION_SPRAY | RESPIRATORY_TRACT | Status: DC

## 2012-06-08 MED ORDER — THEOPHYLLINE ER 200 MG PO TB12: ORAL_TABLET | ORAL | Status: DC

## 2012-06-08 MED ORDER — LOSARTAN POTASSIUM 50 MG PO TABS: ORAL_TABLET | ORAL | Status: DC

## 2012-06-08 MED ORDER — ALBUTEROL SULFATE HFA 108 (90 BASE) MCG/ACT IN AERS: INHALATION_SPRAY | RESPIRATORY_TRACT | Status: DC

## 2012-06-08 MED ORDER — FUROSEMIDE 40 MG PO TABS: ORAL_TABLET | ORAL | Status: DC

## 2012-06-08 MED ORDER — PRAVASTATIN SODIUM 40 MG PO TABS: ORAL_TABLET | ORAL | Status: DC

## 2012-06-08 NOTE — Progress Notes (Signed)
Subjective:    Patient ID: Crystal [name], female    DOB: 06-18-1945, 66 y.o.   MRN: [id number]  HPI Medicare Wellness Visit:  The following psychosocial & medical history were reviewed as required by Medicare.   Social history: caffeine: minimal , alcohol:  14 glasses/ week ,  tobacco use : quit 1991  & exercise : 7 days / week; trainer X 2 & water aerobics 5X.   Home & personal  safety / fall risk: no issues, activities of daily living: no limitations , seatbelt use : yes , and smoke alarm employment : yes .  Power of Attorney/Living Will status : in place  Vision ( as recorded per Nurse) & Hearing  evaluation :  Ophth exam 5/13. Orientation :oriented X 3 , memory & recall :good, spelling  Testing: "DLORW" ,and mood & affect : normal . Depression / anxiety: denied Travel history : 2010 [place] , immunization status : Tetanus due , transfusion history:  no, and preventive health surveillance ( colonoscopies, BMD , etc as per protocol/ SOC): up to date, Dental care:  Every 6 mos . Chart reviewed &  Updated. Active issues reviewed & addressed.       Review of Systems HYPERTENSION: Disease Monitoring: Blood pressure range-115-120/70-80 Chest pain, palpitations- no       Dyspnea- no , asthma controlled Medications: Compliance- yes  Lightheadedness,Syncope- occasional inner ear symptoms    Edema- no  HYPERLIPIDEMIA: Disease Monitoring: See symptoms for Hypertension Medications: Compliance- yes  Abd pain, bowel changes- no   Muscle aches- no          Objective:   Physical Exam Gen.:  well-nourished in appearance. Alert, appropriate and cooperative throughout exam. Head: Normocephalic without obvious abnormalities  Eyes: No corneal or conjunctival inflammation noted.  Extraocular motion intact. Vision grossly normal. Ears: External  ear exam reveals no significant lesions or deformities. Some wax bilaterally. Hearing is grossly normal bilaterally. Nose: External nasal exam reveals  no deformity or inflammation. Nasal mucosa are pink and moist. No lesions or exudates noted.  Mouth: Oral mucosa and oropharynx reveal no lesions or exudates. Teeth in good repair. Neck: No deformities, masses, or tenderness noted. Range of motion & Thyroid normal. Lungs: Normal respiratory effort; chest expands symmetrically. Lungs are clear to auscultation without rales, wheezes, or increased work of breathing.BS slightly decreased Heart: Normal rate and rhythm. Accentuated  S1 @ apex. No gallop, click, or rub. No murmur. Abdomen: Bowel sounds normal; abdomen soft and nontender. No masses, organomegaly or hernias noted. Genitalia: Dr Chevis Pretty, Gyn                                                                      Musculoskeletal/extremities: No deformity or scoliosis noted of  the thoracic or lumbar spine. No clubbing, cyanosis, edema, or deformity noted. Range of motion  normal .Tone & strength  normal.Joints normal. Nail health  good. Vascular: Carotid, radial artery, dorsalis pedis and  posterior tibial pulses are  Equal.Pedal pulses slightly decreased. Faint L carotid bruit present. Neurologic: Alert and oriented x3. Deep tendon reflexes symmetrical and normal.          Skin: Intact without suspicious lesions or rashes.Scattered ecchymoses. Hyperpigmentation changes lower back  Lymph: No cervical,  axillary, or inguinal lymphadenopathy present. Psych: Mood and affect are normal. Normally interactive                                                                                         Assessment & Plan:  #1 Medicare Wellness Exam; criteria met ; data entered #2 Problem List reviewed ; Assessment/ Recommendations made Plan: see Orders

## 2012-06-08 NOTE — Patient Instructions (Addendum)
Preventive Health Care: Exercise  30-45  minutes a day, 3-4 days a week. Walking is especially valuable in preventing Osteoporosis.The best exercises for the low back include freestyle swimming, stretch aerobics, and yoga. Eat a low-fat diet with lots of fruits and vegetables, up to 7-9 servings per day.    Blood Pressure Goal  Ideally is an AVERAGE < 135/85. This AVERAGE should be calculated from @ least 5-7 BP readings taken @ different times of day on different days of week. You should not respond to isolated BP readings , but rather the AVERAGE for that week

## 2012-06-09 LAB — THEOPHYLLINE LEVEL: Theophylline Lvl: 13.6 ug/mL (ref 10.0–20.0)

## 2012-06-11 LAB — VITAMIN D 1,25 DIHYDROXY
Vitamin D 1, 25 (OH)2 Total: 83 pg/mL — ABNORMAL HIGH (ref 18–72)
Vitamin D2 1, 25 (OH)2: <8 pg/mL
Vitamin D3 1, 25 (OH)2: 83 pg/mL

## 2012-06-28 ENCOUNTER — Other Ambulatory Visit: Payer: Self-pay | Admitting: Internal Medicine

## 2012-07-07 ENCOUNTER — Encounter: Payer: Self-pay | Admitting: Internal Medicine

## 2012-11-12 LAB — HM MAMMOGRAPHY: HM Mammogram: NORMAL

## 2013-03-28 ENCOUNTER — Telehealth: Payer: Self-pay | Admitting: *Deleted

## 2013-03-28 NOTE — Telephone Encounter (Signed)
Patient called and stated that the patient is requesting a refill for Mentax. Medication is not on current medication list. Patient stated that she would like it for it to go to [facility] but did not state which one. Left message on patient voicemail to confirm which pharmacy she would like this sent to. Please advise. SW

## 2013-03-28 NOTE — Telephone Encounter (Signed)
This is a branded anti fungal cream which is used for athletes feet or fungus on other areas of body. It could be used twice a day for 7 days max.  1% cream 12 g (may be available OTC asLotrimin Ultra)

## 2013-03-29 ENCOUNTER — Other Ambulatory Visit: Payer: Self-pay | Admitting: *Deleted

## 2013-03-29 MED ORDER — BUTENAFINE HCL 1 % EX CREA: TOPICAL_CREAM | CUTANEOUS | Status: DC

## 2013-03-29 NOTE — Telephone Encounter (Signed)
Refill sent.

## 2013-03-29 NOTE — Telephone Encounter (Signed)
Mentax generic cream sent to pharmacy

## 2013-04-09 ENCOUNTER — Other Ambulatory Visit: Payer: Self-pay | Admitting: *Deleted

## 2013-04-09 MED ORDER — BUTENAFINE HCL 1 % EX CREA: TOPICAL_CREAM | CUTANEOUS | Status: DC

## 2013-04-19 ENCOUNTER — Other Ambulatory Visit: Payer: Self-pay

## 2013-06-11 ENCOUNTER — Telehealth: Payer: Self-pay

## 2013-06-11 NOTE — Telephone Encounter (Signed)
Medication and allergies:  Reviewed and updated  90 day supply/mail order: na Local pharmacy: [facility]   Immunizations due:  UTD (will bring info on shingles vaccine)  A/P:   No changes to FH or PSH or personal hx Tdap--05/2012 Flu vaccine--03/2013 PNA--2011,1997  Bone Density--06/2011 [facility]CS--10/2011--Dr Stark--next due 2018  To Discuss with Provider: Leg pain--from statin?

## 2013-06-13 ENCOUNTER — Ambulatory Visit (INDEPENDENT_AMBULATORY_CARE_PROVIDER_SITE_OTHER): Payer: Medicare Other | Admitting: Internal Medicine

## 2013-06-13 ENCOUNTER — Encounter: Payer: Self-pay | Admitting: Internal Medicine

## 2013-06-13 VITALS — BP 149/77 | HR 97 | Temp 97.6°F | Resp 13 | Ht 63.0 in | Wt 141.0 lb

## 2013-06-13 DIAGNOSIS — Z0000 Encounter for general adult medical examination without abnormal findings: Secondary | ICD-10-CM

## 2013-06-13 DIAGNOSIS — E790 Hyperuricemia without signs of inflammatory arthritis and tophaceous disease: Secondary | ICD-10-CM

## 2013-06-13 DIAGNOSIS — Z86010 Personal history of colonic polyps: Secondary | ICD-10-CM

## 2013-06-13 DIAGNOSIS — L259 Unspecified contact dermatitis, unspecified cause: Secondary | ICD-10-CM

## 2013-06-13 DIAGNOSIS — Z1239 Encounter for other screening for malignant neoplasm of breast: Secondary | ICD-10-CM

## 2013-06-13 DIAGNOSIS — R7989 Other specified abnormal findings of blood chemistry: Secondary | ICD-10-CM

## 2013-06-13 DIAGNOSIS — L309 Dermatitis, unspecified: Secondary | ICD-10-CM

## 2013-06-13 DIAGNOSIS — J449 Chronic obstructive pulmonary disease, unspecified: Secondary | ICD-10-CM

## 2013-06-13 DIAGNOSIS — E785 Hyperlipidemia, unspecified: Secondary | ICD-10-CM

## 2013-06-13 DIAGNOSIS — M899 Disorder of bone, unspecified: Secondary | ICD-10-CM

## 2013-06-13 DIAGNOSIS — I10 Essential (primary) hypertension: Secondary | ICD-10-CM

## 2013-06-13 LAB — CBC WITH DIFFERENTIAL/PLATELET
Basophils Absolute: 0.1 10*3/uL (ref 0.0–0.1)
Basophils Relative: 0.4 % (ref 0.0–3.0)
Eosinophils Absolute: 0.1 10*3/uL (ref 0.0–0.7)
Eosinophils Relative: 0.9 % (ref 0.0–5.0)
HCT: 44.8 % (ref 36.0–46.0)
Hemoglobin: 15.3 g/dL — ABNORMAL HIGH (ref 12.0–15.0)
Lymphocytes Relative: 18.1 % (ref 12.0–46.0)
Lymphs Abs: 2.2 10*3/uL (ref 0.7–4.0)
MCHC: 34.3 g/dL (ref 30.0–36.0)
MCV: 98.3 fl (ref 78.0–100.0)
Monocytes Absolute: 0.7 10*3/uL (ref 0.1–1.0)
Monocytes Relative: 5.8 % (ref 3.0–12.0)
Neutro Abs: 9.1 10*3/uL — ABNORMAL HIGH (ref 1.4–7.7)
Neutrophils Relative %: 74.8 % (ref 43.0–77.0)
Platelets: 339 10*3/uL (ref 150.0–400.0)
RBC: 4.56 Mil/uL (ref 3.87–5.11)
RDW: 13.9 % (ref 11.5–14.6)
WBC: 12.2 10*3/uL — ABNORMAL HIGH (ref 4.5–10.5)

## 2013-06-13 LAB — BASIC METABOLIC PANEL
BUN: 11 mg/dL (ref 6–23)
CO2: 26 mEq/L (ref 19–32)
Calcium: 9.2 mg/dL (ref 8.4–10.5)
Chloride: 101 mEq/L (ref 96–112)
Creatinine, Ser: 0.6 mg/dL (ref 0.4–1.2)
GFR: 99.49 mL/min (ref >60.00)
Glucose, Bld: 87 mg/dL (ref 70–99)
Potassium: 4.2 mEq/L (ref 3.5–5.1)
Sodium: 139 mEq/L (ref 135–145)

## 2013-06-13 LAB — URIC ACID: Uric Acid, Serum: 7 mg/dL (ref 2.4–7.0)

## 2013-06-13 LAB — HEPATIC FUNCTION PANEL
ALT: 20 U/L (ref 0–35)
AST: 29 U/L (ref 0–37)
Albumin: 4.7 g/dL (ref 3.5–5.2)
Alkaline Phosphatase: 78 U/L (ref 39–117)
Bilirubin, Direct: 0.1 mg/dL (ref 0.0–0.3)
Total Bilirubin: 0.9 mg/dL (ref 0.3–1.2)
Total Protein: 7.6 g/dL (ref 6.0–8.3)

## 2013-06-13 LAB — TSH: TSH: 1.86 u[IU]/mL (ref 0.35–5.50)

## 2013-06-13 LAB — LIPID PANEL
Cholesterol: 256 mg/dL — ABNORMAL HIGH (ref 0–200)
HDL: 118 mg/dL (ref >39.00)
Total CHOL/HDL Ratio: 2
Triglycerides: 52 mg/dL (ref 0.0–149.0)
VLDL: 10.4 mg/dL (ref 0.0–40.0)

## 2013-06-13 LAB — LDL CHOLESTEROL, DIRECT: Direct LDL: 116.6 mg/dL

## 2013-06-13 MED ORDER — FUROSEMIDE 40 MG PO TABS: ORAL_TABLET | ORAL | Status: DC

## 2013-06-13 MED ORDER — ALBUTEROL SULFATE HFA 108 (90 BASE) MCG/ACT IN AERS: INHALATION_SPRAY | RESPIRATORY_TRACT | Status: DC

## 2013-06-13 MED ORDER — BUDESONIDE-FORMOTEROL FUMARATE 160-4.5 MCG/ACT IN AERO: INHALATION_SPRAY | RESPIRATORY_TRACT | Status: DC

## 2013-06-13 MED ORDER — PRAVASTATIN SODIUM 40 MG PO TABS: ORAL_TABLET | ORAL | Status: DC

## 2013-06-13 MED ORDER — SINGULAIR 10 MG PO TABS: ORAL_TABLET | ORAL | Status: DC

## 2013-06-13 MED ORDER — LOSARTAN POTASSIUM 50 MG PO TABS: ORAL_TABLET | ORAL | Status: DC

## 2013-06-13 NOTE — Progress Notes (Signed)
Subjective:    Patient ID: Crystal [name], female    DOB: Jun 24, 1945, 67 y.o.   MRN: [id number]  HPI Medicare Wellness Visit: Psychosocial and medical history were reviewed as required by Medicare (history related to abuse, antisocial behavior , firearm risk). Social history: Caffeine:none  , Alcohol: 14 wines/ week , Tobacco WUJ:WJXB 1991 Exercise:see below Personal safety/fall risk:no Limitations of activities of daily living:no Seatbelt/ smoke alarm use:yes Healthcare Power of Attorney/Living Will status: in place  Ophthalmologic exam status:current Hearing evaluation status:not current Orientation: Oriented X 3 Memory and recall: good Spelling  testing: good Depression/anxiety assessment: no Foreign travel history:[place] 2012 Immunization status for influenza/pneumonia/ shingles /tetanus: UTD Transfusion history:no Preventive health care maintenance status: Colonoscopy/BMD/mammogram/Pap as per protocol/standard care:BMD due 1/15 Dental care:every 3 mos Chart reviewed and updated. Active issues reviewed and addressed as documented below.    Review of Systems A heart healthy diet is followed; exercise encompasses 90 minutes up to 7  times per week as  Weights, Pilates, water aerobics without symptoms.  Family history is prematures for premature coronary disease. Advanced cholesterol testing reveals  LDL goal is less than 130 ; ideally <100 . There is medication compliance with the statin.  Low dose ASA taken Specifically denied are  chest pain, palpitations, dyspnea, or claudication.  Significant abdominal symptoms, memory deficit, or myalgias not present.     Objective:   Physical Exam Gen.: Healthy and well-nourished in appearance. Alert, appropriate and cooperative throughout exam.Appears younger than stated age  Head: Normocephalic without obvious abnormalities  Eyes: No corneal or conjunctival inflammation noted. Pupils equal round reactive to light and accommodation.  Extraocular motion intact.  Ears: External  ear exam reveals no significant lesions or deformities. Canals clear .TMs normal. Hearing is grossly normal bilaterally. Nose: External nasal exam reveals no deformity or inflammation. Nasal mucosa are pink and moist. No lesions or exudates noted.  Mouth: Oral mucosa and oropharynx reveal no lesions or exudates. Teeth in good repair. Neck: No deformities, masses, or tenderness noted. Range of motion & Thyroid normal. Lungs: Normal respiratory effort; chest expands symmetrically. Lungs are clear to auscultation without rales, wheezes, or increased work of breathing. Heart: Normal rate and rhythm. Normal S1 and S2. No gallop, click, or rub. S4 w/o murmur. Abdomen: Bowel sounds normal; abdomen soft and nontender. No masses, organomegaly or hernias noted. Aorta palpable; bruit present ; no AAA Genitalia: Genitalia normal except for left varices. Prostate is normal without enlargement, asymmetry, nodularity, or induration  OR as per Gyn                                  Musculoskeletal/extremities:  Slight reverse  curvature of upper thoracic spine. No clubbing, cyanosis, edema, or significant extremity  deformity noted. Range of motion normal .Tone & strength normal. Hand joints normal . Fingernail health good. Able to lie down & sit up w/o help. Negative SLR bilaterally Vascular: Carotid, radial artery, dorsalis pedis and  posterior tibial pulses are full and equal.Aortic  & brisk faint L carotid  bruits present. Neurologic: Alert and oriented x3. Deep tendon reflexes symmetrical and normal.        Skin: Intact without suspicious lesions or rashes. Lymph: No cervical, axillary lymphadenopathy present. Psych: Mood and affect are normal. Normally interactive  Assessment & Plan:  #1 Medicare Wellness Exam; criteria met ; data entered #2 Problem List/Diagnoses  reviewed Plan:  Assessments made/ Orders entered  

## 2013-06-13 NOTE — Progress Notes (Signed)
Pre visit review using our clinic review tool, if applicable. No additional management support is needed unless otherwise documented below in the visit note. 

## 2013-06-13 NOTE — Patient Instructions (Addendum)
Your next office appointment will be determined based upon review of your pending labs &/ or x-rays. Those instructions will be transmitted to you through My Chart . Minimal Blood Pressure Goal= AVERAGE < 140/90;  Ideal is an AVERAGE < 135/85. This AVERAGE should be calculated from @ least 5-7 BP readings taken @ different times of day on different days of week. You should not respond to isolated BP readings , but rather the AVERAGE for that week .Please bring your  blood pressure cuff to office visits to verify that it is reliable.It  can also be checked against the blood pressure device at the pharmacy. Finger or wrist cuffs are not dependable; an arm cuff is. 

## 2013-06-16 LAB — THEOPHYLLINE LEVEL: Theophylline Lvl: <2.5 ug/mL — ABNORMAL LOW (ref 10.0–20.0)

## 2013-06-17 LAB — VITAMIN D 1,25 DIHYDROXY
Vitamin D 1, 25 (OH)2 Total: 60 pg/mL (ref 18–72)
Vitamin D2 1, 25 (OH)2: <8 pg/mL
Vitamin D3 1, 25 (OH)2: 60 pg/mL

## 2013-06-28 ENCOUNTER — Telehealth: Payer: Self-pay | Admitting: Internal Medicine

## 2013-06-28 ENCOUNTER — Other Ambulatory Visit: Payer: Self-pay | Admitting: *Deleted

## 2013-06-28 MED ORDER — THEOPHYLLINE ER 200 MG PO TB12: ORAL_TABLET | ORAL | Status: DC

## 2013-06-28 NOTE — Telephone Encounter (Signed)
Script e-scribed. JG//CMA

## 2013-06-28 NOTE — Telephone Encounter (Signed)
Patient is calling to request a refill on her theophylline (THEODUR) 200 MG rx. She needs [facility] to [facility] [address].  Patient says that her pharmacy has been faxing requests for this but has not heard back. Please advise.

## 2013-07-12 ENCOUNTER — Telehealth: Payer: Self-pay | Admitting: Internal Medicine

## 2013-07-12 NOTE — Telephone Encounter (Signed)
Relevant patient education assigned to patient using Emmi. ° °

## 2013-08-05 ENCOUNTER — Encounter: Payer: Self-pay | Admitting: Internal Medicine

## 2013-08-27 ENCOUNTER — Encounter: Payer: Self-pay | Admitting: Internal Medicine

## 2013-10-17 ENCOUNTER — Encounter: Payer: Self-pay | Admitting: Internal Medicine

## 2013-10-17 ENCOUNTER — Ambulatory Visit (INDEPENDENT_AMBULATORY_CARE_PROVIDER_SITE_OTHER): Payer: Medicare Other | Admitting: Internal Medicine

## 2013-10-17 VITALS — BP 140/80 | HR 84 | Temp 97.9°F | Resp 14 | Wt 144.6 lb

## 2013-10-17 DIAGNOSIS — J309 Allergic rhinitis, unspecified: Secondary | ICD-10-CM

## 2013-10-17 MED ORDER — AMOXICILLIN 500 MG PO CAPS
500.0000 mg | ORAL_CAPSULE | Freq: Three times a day (TID) | ORAL | Status: DC
Start: 2013-10-17 — End: 2014-06-19

## 2013-10-17 NOTE — Progress Notes (Signed)
Pre visit review using our clinic review tool, if applicable. No additional management support is needed unless otherwise documented below in the visit note. 

## 2013-10-17 NOTE — Progress Notes (Signed)
   Subjective:    Patient ID: Crystal [name], female    DOB: 1946-04-13, 68 y.o.   MRN: [id number]  HPI   Her symptoms began 10/15/13 a sore throat, rhinitis, and chest congestion. She believes the trigger was pollen.  There's been only marginal response to Tylenol and Robitussin  At this time she has frontal headache, sore throat, earache, nonproductive cough, and wheezing. She has had some extrinsic symptoms of itchy, watery eyes, and sneezing.  This is in the context of a history of asthma. She quit smoking in 1990  Review of Systems  She specifically denies facial pain, dental pain, nasal purulence, otic discharge, sputum production, fever, chills, or sweats.        Objective:   Physical Exam  General appearance:good health ;well nourished; no acute distress or increased work of breathing is present.  No  lymphadenopathy about the head, neck, or axilla noted.   Eyes: No conjunctival inflammation or lid edema is present.  Ears:  External ear exam shows no significant lesions or deformities.  Otoscopic examination reveals clear canals, tympanic membranes are intact bilaterally without bulging, retraction, inflammation or discharge.  Nose:  External nasal examination shows no deformity or inflammation. Nasal mucosa are drywithout lesions or exudates. No septal dislocation or deviation.No obstruction to airflow.   Oral exam: Dental hygiene is good; lips and gums are healthy appearing.There is no oropharyngeal erythema or exudate noted.   Neck:  No deformities, thyromegaly, masses, or tenderness noted.     Heart:  Normal rate and regular rhythm. S1 and S2 normal without gallop, murmur, click, rub or other extra sounds.   Lungs:Chest clear to auscultation; no wheezes, rhonchi,rales ,or rubs present.No increased work of breathing.  Decreased BS  Extremities:  No cyanosis, edema, or clubbing  noted    Skin: Warm & dry .         Assessment & Plan:  #1 allergic rhinitis See  orders & AVS

## 2013-10-17 NOTE — Patient Instructions (Signed)
Plain Mucinex (NOT D) for thick secretions ;force NON dairy fluids .   Nasal cleansing in the shower as discussed with lather of mild shampoo.After 10 seconds wash off lather while  exhaling through nostrils. Make sure that all residual soap is removed to prevent irritation.  Flonase OR Nasacort AQ 1 spray in each nostril twice a day as needed. Use the "crossover" technique into opposite nostril spraying toward opposite ear @ 45 degree angle, not straight up into nostril.  Use a Neti pot daily only  as needed for significant sinus congestion; going from open side to congested side . Plain Allegra (NOT D )  160 daily , Loratidine 10 mg , OR Zyrtec 10 mg @ bedtime  as needed for itchy eyes & sneezing.  Fill the  prescription for antibiotic it there is not dramatic improvement in the next 72 hours. 

## 2013-12-24 ENCOUNTER — Other Ambulatory Visit: Payer: Self-pay | Admitting: Internal Medicine

## 2014-04-13 ENCOUNTER — Ambulatory Visit (INDEPENDENT_AMBULATORY_CARE_PROVIDER_SITE_OTHER): Payer: Medicare Other | Admitting: Family Medicine

## 2014-04-13 ENCOUNTER — Encounter: Payer: Self-pay | Admitting: Family Medicine

## 2014-04-13 VITALS — BP 130/80 | HR 82 | Temp 98.0°F | Ht 63.0 in | Wt 140.1 lb

## 2014-04-13 DIAGNOSIS — I10 Essential (primary) hypertension: Secondary | ICD-10-CM

## 2014-04-13 DIAGNOSIS — J441 Chronic obstructive pulmonary disease with (acute) exacerbation: Secondary | ICD-10-CM | POA: Insufficient documentation

## 2014-04-13 MED ORDER — HYDROCODONE-HOMATROPINE 5-1.5 MG/5ML PO SYRP: 5.0000 mL | ORAL_SOLUTION | Freq: Three times a day (TID) | ORAL | Status: DC | PRN

## 2014-04-13 MED ORDER — AMOXICILLIN 500 MG PO CAPS: 500.0000 mg | ORAL_CAPSULE | Freq: Three times a day (TID) | ORAL | Status: DC

## 2014-04-13 NOTE — Progress Notes (Signed)
Patient ID: Crystal [name], female   DOB: 02-Aug-1945, 68 y.o.   MRN: [id number] Crystal [name] [id number] [birth year] 04/13/2014      Progress Note-Follow Up  Subjective  Chief Complaint  Chief Complaint  Patient presents with  . Cough    X6 days w/phlegm (yellow)  . Sore Throat    X6 days    HPI  Patient is a 68 year old female in today for routine medical care. Patient with 1 week of uri symptoms worsening x 2 days. Just returned from a cruise in [place], had some congestion she thought was allergies, started Zyrtec and seemed better but is now worsening. C/O head and chest congestion, wheezing, fevers, malaise, diarrhea, body aches and fatigue. Was wheezing and up much of the night. Chest pain with coughing now she has coughed so much productive of yellow sputum, clear rhinorrhea.   Past Medical History  Diagnosis Date  . IBS (irritable bowel syndrome)   . Allergy     seasonal  . Hyperlipidemia   . Hypertension   . RAD (reactive airway disease)   . MVP (mitral valve prolapse)   . Peripheral vascular disease   . Diverticulosis   . Cataract   . Adenomatous colon polyp 2007 & 2010     Dr [name]    Past Surgical History  Procedure Laterality Date  . Abdominal hysterectomy      with USO for dysfunctionall menses  . Appendectomy    . Lumbar laminectomy  2000    Dr Vertell Limber  . Tonsillectomy    . Shoulder surgery      R shoulder  . Colonoscopy w/ polypectomy       X2 ; diverticulosis  . Cataract extraction w/ intraocular lens implant  2013    bilateral; Dr [name]  . Colonoscopy  2013    negative , due 2018    Family History  Problem Relation Age of Onset  . Parkinsonism Mother   . Heart attack Father     pre 44  . Diabetes Sister   . Heart attack Brother 70  . COPD Sister     X 3  . Colon cancer Neg Hx   . Stroke Neg Hx   . Cancer Neg Hx     History   Social History  . Marital Status: Married    Spouse Name: N/A    Number of Children: N/A  . Years of Education: N/A    Occupational History  . Not on file.   Social History Main Topics  . Smoking status: Former Smoker -- 1.00 packs/day for 23 years    Quit date: 06/14/1989  . Smokeless tobacco: Never Used     Comment: smoked 1961-1991 , up to < 1 ppd  . Alcohol Use: 8.4 oz/week    14 Glasses of wine per week     Comment: Wine  . Drug Use: No  . Sexual Activity: Not on file   Other Topics Concern  . Not on file   Social History Narrative  . No narrative on file    Current Outpatient Prescriptions on File Prior to Visit  Medication Sig Dispense Refill  . albuterol (PROAIR HFA) 108 (90 BASE) MCG/ACT inhaler inhale 1 puff by mouth as directed if needed  25.5 g  11  . ALPRAZolam (XANAX) 0.5 MG tablet Take 1 tablet by mouth as needed. To help sleep      . amoxicillin (AMOXIL) 500 MG capsule Take 1 capsule (500 mg  total) by mouth 3 (three) times daily.  21 capsule  0  . budesonide-formoterol (SYMBICORT) 160-4.5 MCG/ACT inhaler inhale 1 to 2 puffs by mouth every 12 hours then gargle and SPIT AFTER USE  10.2 g  11  . Butenafine HCl 1 % cream Apply topically twice daily for seven days  12 g  0  . Calcium Carbonate-Vitamin D (CALCIUM 600/VITAMIN D PO) Take by mouth daily.        . Cholecalciferol (VITAMIN D3) 1000 UNITS CAPS Take by mouth. 3 by mouth once daily       . FLUOCINOLONE ACETONIDE BODY 0.01 % OIL Apply 1 drop topically as needed.      . fluocinonide (LIDEX) 0.05 % external solution Apply 1 application topically as needed.      . furosemide (LASIX) 40 MG tablet TAKE ONE TABLET BY MOUTH ONE TIME DAILY  90 tablet  3  . losartan (COZAAR) 50 MG tablet 1/2 tablet daily  45 tablet  3  . pravastatin (PRAVACHOL) 40 MG tablet take 1 tablet by mouth at bedtime  90 tablet  3  . Probiotic Product (ALIGN) 4 MG CAPS Take by mouth daily.        Marland Kitchen SINGULAIR 10 MG tablet take 1 tablet by mouth once daily  90 tablet  3  . theophylline (THEODUR) 200 MG 12 hr tablet TAKE 1 TABLET BY MOUTH EVERY 12 HOURS  180  tablet  3  . traMADol (ULTRAM) 50 MG tablet Take 1 tablet (50 mg total) by mouth every 6 (six) hours as needed.  60 tablet  0  . trimethoprim (TRIMPEX) 100 MG tablet Take 1 tablet by mouth as needed. To prevent bladder problems when travels       No current facility-administered medications on file prior to visit.    Allergies  Allergen Reactions  . Simvastatin     myalgias    Review of Systems  Review of Systems  Constitutional: Positive for fever and malaise/fatigue.  HENT: Positive for congestion and sore throat.   Eyes: Negative for discharge.  Respiratory: Positive for cough, sputum production and wheezing. Negative for shortness of breath.   Cardiovascular: Negative for chest pain, palpitations and leg swelling.  Gastrointestinal: Positive for diarrhea. Negative for nausea and abdominal pain.  Genitourinary: Negative for dysuria.  Musculoskeletal: Negative for falls.  Skin: Negative for rash.  Neurological: Positive for headaches. Negative for loss of consciousness.  Endo/Heme/Allergies: Negative for polydipsia.  Psychiatric/Behavioral: Negative for depression and suicidal ideas. The patient is not nervous/anxious and does not have insomnia.     Objective  BP 130/80  Pulse 82  Temp(Src) 98 F (36.7 C) (Oral)  Ht 5\' 3"  (1.6 m)  Wt 140 lb 1.9 oz (63.558 kg)  BMI 24.83 kg/m2  SpO2 94%  Physical Exam  Physical Exam  Constitutional: She is oriented to person, place, and time and well-developed, well-nourished, and in no distress. No distress.  HENT:  Head: Normocephalic and atraumatic.  Oropharynx erythematous, nasal mucosa pboggy and erythematous  Eyes: Conjunctivae are normal.  Neck: Neck supple. No thyromegaly present.  Cardiovascular: Normal rate, regular rhythm and normal heart sounds.   No murmur heard. Pulmonary/Chest: Effort normal and breath sounds normal. She has no wheezes.  Abdominal: She exhibits no distension and no mass.  Musculoskeletal: She  exhibits no edema.  Lymphadenopathy:    She has no cervical adenopathy.  Neurological: She is alert and oriented to person, place, and time.  Skin: Skin is warm and  dry. No rash noted. She is not diaphoretic.  Psychiatric: Memory, affect and judgment normal.    Lab Results  Component Value Date   TSH 1.86 06/13/2013   Lab Results  Component Value Date   WBC 12.2* 06/13/2013   HGB 15.3* 06/13/2013   HCT 44.8 06/13/2013   MCV 98.3 06/13/2013   PLT 339.0 06/13/2013   Lab Results  Component Value Date   CREATININE 0.6 06/13/2013   BUN 11 06/13/2013   NA 139 06/13/2013   K 4.2 06/13/2013   CL 101 06/13/2013   CO2 26 06/13/2013   Lab Results  Component Value Date   ALT 20 06/13/2013   AST 29 06/13/2013   ALKPHOS 78 06/13/2013   BILITOT 0.9 06/13/2013   Lab Results  Component Value Date   CHOL 256* 06/13/2013   Lab Results  Component Value Date   HDL 118.00 06/13/2013   No results found for this basename: [facility]   Lab Results  Component Value Date   TRIG 52.0 06/13/2013   Lab Results  Component Value Date   CHOLHDL 2 06/13/2013     Assessment & Plan  Essential hypertension Well controlled, no changes to meds. Encouraged heart healthy diet such as the DASH diet and exercise as tolerated.   COPD exacerbation Albuterol, Mucinex and probiotics as directed. Amoxicllin tid and Hydromet qhs prn. Encouraged increased rest and hydration, add probiotics, zinc such as Coldeze or Xicam. Treat fevers as needed

## 2014-04-13 NOTE — Assessment & Plan Note (Signed)
Well controlled, no changes to meds. Encouraged heart healthy diet such as the DASH diet and exercise as tolerated.  °

## 2014-04-13 NOTE — Assessment & Plan Note (Signed)
Albuterol, Mucinex and probiotics as directed. Amoxicllin tid and Hydromet qhs prn. Encouraged increased rest and hydration, add probiotics, zinc such as Coldeze or Xicam. Treat fevers as needed

## 2014-04-13 NOTE — Progress Notes (Signed)
Pre visit review using our clinic review tool, if applicable. No additional management support is needed unless otherwise documented below in the visit note. 

## 2014-04-13 NOTE — Patient Instructions (Signed)

## 2014-05-15 ENCOUNTER — Other Ambulatory Visit: Payer: Self-pay | Admitting: Internal Medicine

## 2014-05-27 ENCOUNTER — Other Ambulatory Visit: Payer: Self-pay

## 2014-05-27 DIAGNOSIS — J449 Chronic obstructive pulmonary disease, unspecified: Secondary | ICD-10-CM

## 2014-05-27 MED ORDER — SINGULAIR 10 MG PO TABS: ORAL_TABLET | ORAL | Status: DC

## 2014-06-19 ENCOUNTER — Ambulatory Visit (INDEPENDENT_AMBULATORY_CARE_PROVIDER_SITE_OTHER): Payer: Medicare Other | Admitting: Internal Medicine

## 2014-06-19 ENCOUNTER — Other Ambulatory Visit: Payer: Self-pay | Admitting: Internal Medicine

## 2014-06-19 ENCOUNTER — Other Ambulatory Visit (INDEPENDENT_AMBULATORY_CARE_PROVIDER_SITE_OTHER): Payer: Medicare Other

## 2014-06-19 ENCOUNTER — Encounter: Payer: Self-pay | Admitting: Internal Medicine

## 2014-06-19 VITALS — BP 140/80 | HR 101 | Temp 97.5°F | Resp 14 | Ht 63.0 in | Wt 139.5 lb

## 2014-06-19 DIAGNOSIS — M8580 Other specified disorders of bone density and structure, unspecified site: Secondary | ICD-10-CM

## 2014-06-19 DIAGNOSIS — I10 Essential (primary) hypertension: Secondary | ICD-10-CM

## 2014-06-19 DIAGNOSIS — IMO0001 Reserved for inherently not codable concepts without codable children: Secondary | ICD-10-CM

## 2014-06-19 DIAGNOSIS — E790 Hyperuricemia without signs of inflammatory arthritis and tophaceous disease: Secondary | ICD-10-CM

## 2014-06-19 DIAGNOSIS — E785 Hyperlipidemia, unspecified: Secondary | ICD-10-CM

## 2014-06-19 DIAGNOSIS — M791 Myalgia: Secondary | ICD-10-CM

## 2014-06-19 DIAGNOSIS — Z86010 Personal history of colonic polyps: Secondary | ICD-10-CM

## 2014-06-19 DIAGNOSIS — M609 Myositis, unspecified: Secondary | ICD-10-CM

## 2014-06-19 LAB — CBC WITH DIFFERENTIAL/PLATELET
Basophils Absolute: 0 10*3/uL (ref 0.0–0.1)
Basophils Relative: 0.4 % (ref 0.0–3.0)
Eosinophils Absolute: 0.1 10*3/uL (ref 0.0–0.7)
Eosinophils Relative: 0.9 % (ref 0.0–5.0)
HCT: 44.9 % (ref 36.0–46.0)
Hemoglobin: 14.8 g/dL (ref 12.0–15.0)
Lymphocytes Relative: 21.9 % (ref 12.0–46.0)
Lymphs Abs: 2.5 10*3/uL (ref 0.7–4.0)
MCHC: 33 g/dL (ref 30.0–36.0)
MCV: 99.5 fl (ref 78.0–100.0)
Monocytes Absolute: 0.7 10*3/uL (ref 0.1–1.0)
Monocytes Relative: 6.5 % (ref 3.0–12.0)
Neutro Abs: 8 10*3/uL — ABNORMAL HIGH (ref 1.4–7.7)
Neutrophils Relative %: 70.3 % (ref 43.0–77.0)
Platelets: 379 10*3/uL (ref 150.0–400.0)
RBC: 4.52 Mil/uL (ref 3.87–5.11)
RDW: 13.9 % (ref 11.5–15.5)
WBC: 11.4 10*3/uL — ABNORMAL HIGH (ref 4.0–10.5)

## 2014-06-19 LAB — HEPATIC FUNCTION PANEL
ALT: 14 U/L (ref 0–35)
AST: 23 U/L (ref 0–37)
Albumin: 4.7 g/dL (ref 3.5–5.2)
Alkaline Phosphatase: 84 U/L (ref 39–117)
Bilirubin, Direct: 0.2 mg/dL (ref 0.0–0.3)
Total Bilirubin: 1.1 mg/dL (ref 0.2–1.2)
Total Protein: 7.6 g/dL (ref 6.0–8.3)

## 2014-06-19 LAB — BASIC METABOLIC PANEL
BUN: 12 mg/dL (ref 6–23)
CO2: 27 mEq/L (ref 19–32)
Calcium: 10 mg/dL (ref 8.4–10.5)
Chloride: 99 mEq/L (ref 96–112)
Creatinine, Ser: 0.5 mg/dL (ref 0.4–1.2)
GFR: 118.51 mL/min (ref >60.00)
Glucose, Bld: 91 mg/dL (ref 70–99)
Potassium: 4.5 mEq/L (ref 3.5–5.1)
Sodium: 138 mEq/L (ref 135–145)

## 2014-06-19 LAB — CK: Total CK: 42 U/L (ref 7–177)

## 2014-06-19 LAB — TSH: TSH: 3.16 u[IU]/mL (ref 0.35–4.50)

## 2014-06-19 LAB — URIC ACID: Uric Acid, Serum: 6.8 mg/dL (ref 2.4–7.0)

## 2014-06-19 LAB — VITAMIN D 25 HYDROXY (VIT D DEFICIENCY, FRACTURES): VITD: 30.33 ng/mL (ref 30.00–100.00)

## 2014-06-19 NOTE — Progress Notes (Signed)
Subjective:    Patient ID: Crystal [name], female    DOB: 04-30-1946, 69 y.o.   MRN: [id number]  HPI She is here to assess status of active health conditions.  PMH, FH, & Social History reviewed & updated.  She is on a heart healthy diet. She is involved in water aerobics for 90 minutes 5 times a week without cardiopulmonary symptoms  She has been compliant with her antihypertensive medication ;she did stop the statin for  2 months thinking this was causing nocturnal leg pain. As there was no change she restarted it in early December.  Blood pressure averages 130/70  She has had adenomatous polyps in the past 2. Her colonoscopy was negative in 2013. She is due in 2018. She has no active GI symptoms at this time.    Review of Systems   Chest pain, palpitations, tachycardia, exertional dyspnea, paroxysmal nocturnal dyspnea, claudication or edema are absent.  Unexplained weight loss, abdominal pain, significant dyspepsia, dysphagia, melena, rectal bleeding, or persistently small caliber stools are denied.    Objective:   Physical Exam  Gen.: Healthy and well-nourished in appearance. Alert, appropriate and cooperative throughout exam. Appears younger than stated age  Head: Normocephalic without obvious abnormalities  Eyes: No corneal or conjunctival inflammation noted. Pupils equal round reactive to light and accommodation. Extraocular motion intact.  Ears: External  ear exam reveals no significant lesions or deformities. Some wax bilaterally. Hearing is grossly normal bilaterally. Nose: External nasal exam reveals no deformity or inflammation. Nasal mucosa are pink and moist. No lesions or exudates noted.   Mouth: Oral mucosa and oropharynx reveal no lesions or exudates. Teeth in good repair. Neck: No deformities, masses, or tenderness noted. Range of motion & Thyroid normal. Lungs: Breath sounds decreased.Normal respiratory effort; chest expands symmetrically. Lungs are clear to  auscultation without rales, wheezes, or increased work of breathing. Heart: Normal rate and rhythm. Normal S1 and S2. No gallop, click, or rub. No murmur. Abdomen: Bowel sounds normal; abdomen soft and nontender. No masses, organomegaly or hernias noted. Genitalia:  as per Gyn                                  Musculoskeletal/extremities: No deformity or scoliosis noted of  the thoracic or lumbar spine. No clubbing, cyanosis, edema, or significant extremity  deformity noted.  Range of motion normal . Tone & strength normal. 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  Fingernail  health good. Minor crepitus of knees  Able to lie down & sit up w/o help.  Negative SLR bilaterally Vascular: Carotid, radial artery, dorsalis pedis and  posterior tibial pulses are full and equal. No bruits present. Neurologic: Alert and oriented x3. Deep tendon reflexes symmetrical and normal.  Gait normal     Skin: Intact without suspicious lesions or rashes.some UE bruising Lymph: No cervical, axillary lymphadenopathy present. Psych: Mood and affect are normal. Normally interactive                                                                                      Assessment &  Plan:  See Current Assessment & Plan in Problem List under specific Diagnosis

## 2014-06-19 NOTE — Assessment & Plan Note (Signed)
Uric acid

## 2014-06-19 NOTE — Assessment & Plan Note (Signed)
NMR Lipoprofile, LFTs, TSH ,CK

## 2014-06-19 NOTE — Patient Instructions (Signed)

## 2014-06-19 NOTE — Assessment & Plan Note (Signed)
Vit D level.

## 2014-06-19 NOTE — Progress Notes (Signed)
Pre visit review using our clinic review tool, if applicable. No additional management support is needed unless otherwise documented below in the visit note. 

## 2014-06-19 NOTE — Assessment & Plan Note (Signed)
CBC

## 2014-06-20 ENCOUNTER — Other Ambulatory Visit: Payer: Self-pay | Admitting: Internal Medicine

## 2014-06-21 LAB — NMR LIPOPROFILE WITH LIPIDS
Cholesterol, Total: 281 mg/dL — ABNORMAL HIGH (ref 100–199)
HDL Particle Number: 50.9 umol/L (ref >=30.5)
HDL Size: 10.6 nm (ref >=9.2)
HDL-C: 153 mg/dL (ref >39)
LDL (calc): 116 mg/dL — ABNORMAL HIGH (ref 0–99)
LDL Particle Number: 1094 nmol/L — ABNORMAL HIGH (ref <1000)
LDL Size: 21.2 nm (ref >=20.8)
LP-IR Score: <25 (ref <=45)
Large HDL-P: >20 umol/L (ref >=4.8)
Large VLDL-P: <0.8 nmol/L (ref <=2.7)
Small LDL Particle Number: <90 nmol/L (ref <=527)
Triglycerides: 58 mg/dL (ref 0–149)
VLDL Size: 41.1 nm (ref <=46.6)

## 2014-10-14 ENCOUNTER — Telehealth: Payer: Self-pay

## 2014-10-14 NOTE — Telephone Encounter (Signed)
Patient called to educate on Medicare Wellness apt. LVM for the patient to call back to educate and schedule for wellness visit.   

## 2014-10-29 ENCOUNTER — Other Ambulatory Visit: Payer: Self-pay | Admitting: Internal Medicine

## 2014-12-13 ENCOUNTER — Ambulatory Visit (INDEPENDENT_AMBULATORY_CARE_PROVIDER_SITE_OTHER): Payer: Medicare Other | Admitting: Internal Medicine

## 2014-12-13 ENCOUNTER — Encounter: Payer: Self-pay | Admitting: Internal Medicine

## 2014-12-13 VITALS — BP 148/82 | HR 85 | Temp 97.5°F | Resp 16 | Wt 127.0 lb

## 2014-12-13 DIAGNOSIS — F4323 Adjustment disorder with mixed anxiety and depressed mood: Secondary | ICD-10-CM

## 2014-12-13 MED ORDER — BUTENAFINE HCL 1 % EX CREA: TOPICAL_CREAM | CUTANEOUS | Status: DC

## 2014-12-13 MED ORDER — SERTRALINE HCL 50 MG PO TABS: ORAL_TABLET | ORAL | Status: DC

## 2014-12-13 NOTE — Progress Notes (Signed)
   Subjective:    Patient ID: Crystal [name], female    DOB: [birth year], 69 y.o.   MRN: [id number]  HPI  She lost her son Ivor Costa to suicide. Additionally her husband has had advanced health issues.She's had a flare of her irritable bowel since that time with loose-watery stools. She's been using Align as well as Immodium.  She describes anorexia and poor sleep. She also describes irritability. She denies depression or suicidal ideation. She will take Xanax a half every several days to improve sleep. She believes she's had some panic symptoms as well.  In 1991 she took Prozac following the death of her mother.    Review of Systems   She has had some tinea skin infections for which she uses a topical agent. She's requesting a refill.  No significant change in weight. No palpitations; racing; irregularity No change in nails,hair,skin other than tinea. No numbness or tingling; tremor. No temperature intolerance to heat ,cold     Objective:   Physical Exam Pertinent or positive findings include:  Breath sounds are decreased. Posterior tibial pulses are decreased. She is intermittently tearful but most appropriate and communicative. She has no abnormal ideation.  General appearance :adequately nourished; in no distress.  Eyes: No conjunctival inflammation or scleral icterus is present.  Oral exam:  Lips and gums are healthy appearing.There is no oropharyngeal erythema or exudate noted. Dental hygiene is good.  Heart:  Normal rate and regular rhythm. S1 and S2 normal without gallop, murmur, click, rub or other extra sounds    Lungs:Chest clear to auscultation; no wheezes, rhonchi,rales ,or rubs present.No increased work of breathing.   Abdomen: bowel sounds normal, soft and non-tender without masses, organomegaly or hernias noted.  No guarding or rebound. No flank tenderness to percussion.  Vascular : all pulses equal ; no bruits present.  Skin:Warm & dry.  Intact without suspicious  lesions or rashes ; no tenting or jaundice   Lymphatic: No lymphadenopathy is noted about the head, neck, axilla   Neuro: Strength, tone & DTRs normal.        Assessment & Plan:  #1 situational anxiety/depression due to the death of her son and her husband's health issues.  The pathophysiology of neurotransmitter deficiency was discussed and options and risks discussed as well in detail.

## 2014-12-13 NOTE — Progress Notes (Signed)
Pre visit review using our clinic review tool, if applicable. No additional management support is needed unless otherwise documented below in the visit note. 

## 2014-12-13 NOTE — Patient Instructions (Signed)
Please consider taking the agent to raise the neurotransmitters which are essential for good brain function, both intellectual & emotional health. These agents are not addictive and simply keep this essential neurotransmitter at therapeutic levels. If these levels become severely depleted; depression or panic attacks can occur.    

## 2014-12-16 ENCOUNTER — Encounter: Payer: Self-pay | Admitting: Internal Medicine

## 2014-12-30 ENCOUNTER — Other Ambulatory Visit: Payer: Self-pay | Admitting: Internal Medicine

## 2014-12-30 ENCOUNTER — Other Ambulatory Visit: Payer: Self-pay | Admitting: Emergency Medicine

## 2014-12-30 MED ORDER — THEOPHYLLINE ER 200 MG PO TB12: 200.0000 mg | ORAL_TABLET | Freq: Two times a day (BID) | ORAL | Status: DC

## 2015-01-01 ENCOUNTER — Telehealth: Payer: Self-pay

## 2015-01-01 NOTE — Telephone Encounter (Signed)
Crystal [name] (pharmacist)  Kennyth Lose called and asked if the theodur currently prescribed to take 200 mg bid could be reduced to 100 mg bid. The 200 mg tablet is unavailable.  Please advise on the change to pt rx dose and sig.

## 2015-01-01 NOTE — Telephone Encounter (Signed)
Her theophylline level has been therapeutic on the 200 mg dose every 12 hours. If the 200 mg pill is not available; change to 100 mg 2 every 12 hours

## 2015-01-01 NOTE — Telephone Encounter (Signed)
Called jackie -pharmacist---supply is 400mg ---per dr hopper (verbal order) ok to use 400 mg scored tablets and 1/2 tablet to equal 200mg  dosage q 12h--

## 2015-01-31 ENCOUNTER — Telehealth: Payer: Self-pay

## 2015-01-31 NOTE — Telephone Encounter (Signed)
2nd outreach for [facility]; Was in for tx of depression. Left a vm for call back to schedule an apt or to call if she would like to know more.

## 2015-05-09 ENCOUNTER — Other Ambulatory Visit: Payer: Self-pay | Admitting: Internal Medicine

## 2015-06-23 ENCOUNTER — Ambulatory Visit: Payer: Medicare Other | Admitting: Internal Medicine

## 2015-06-23 ENCOUNTER — Telehealth: Payer: Self-pay | Admitting: Internal Medicine

## 2015-06-23 MED ORDER — THEOPHYLLINE ER 200 MG PO TB12: 200.0000 mg | ORAL_TABLET | Freq: Two times a day (BID) | ORAL | Status: DC

## 2015-06-23 NOTE — Telephone Encounter (Signed)
Pt called in to reschedule her appointment she had this morning and I wasn't able to get her in until February. She is going to need a refill for theophylline (THEODUR) 200 MG 12 hr tablet [id number]  [facility] is [facility] [address]

## 2015-06-27 ENCOUNTER — Other Ambulatory Visit: Payer: Self-pay | Admitting: Internal Medicine

## 2015-07-11 ENCOUNTER — Encounter: Payer: Self-pay | Admitting: Gastroenterology

## 2015-07-12 ENCOUNTER — Other Ambulatory Visit: Payer: Self-pay | Admitting: Internal Medicine

## 2015-07-14 ENCOUNTER — Other Ambulatory Visit: Payer: Self-pay | Admitting: Emergency Medicine

## 2015-07-14 MED ORDER — BUDESONIDE-FORMOTEROL FUMARATE 160-4.5 MCG/ACT IN AERO: INHALATION_SPRAY | RESPIRATORY_TRACT | Status: DC

## 2015-07-14 NOTE — Telephone Encounter (Signed)
Refill request from pharm for symbicort

## 2015-07-18 ENCOUNTER — Encounter: Payer: Self-pay | Admitting: Internal Medicine

## 2015-07-18 ENCOUNTER — Other Ambulatory Visit (INDEPENDENT_AMBULATORY_CARE_PROVIDER_SITE_OTHER): Payer: Medicare Other

## 2015-07-18 ENCOUNTER — Ambulatory Visit (INDEPENDENT_AMBULATORY_CARE_PROVIDER_SITE_OTHER): Payer: Medicare Other | Admitting: Internal Medicine

## 2015-07-18 VITALS — BP 144/80 | HR 87 | Temp 97.9°F | Resp 16 | Wt 125.0 lb

## 2015-07-18 DIAGNOSIS — M8580 Other specified disorders of bone density and structure, unspecified site: Secondary | ICD-10-CM

## 2015-07-18 DIAGNOSIS — I10 Essential (primary) hypertension: Secondary | ICD-10-CM | POA: Diagnosis not present

## 2015-07-18 DIAGNOSIS — E785 Hyperlipidemia, unspecified: Secondary | ICD-10-CM

## 2015-07-18 DIAGNOSIS — J4489 Other specified chronic obstructive pulmonary disease: Secondary | ICD-10-CM

## 2015-07-18 DIAGNOSIS — J449 Chronic obstructive pulmonary disease, unspecified: Secondary | ICD-10-CM

## 2015-07-18 DIAGNOSIS — Z23 Encounter for immunization: Secondary | ICD-10-CM | POA: Diagnosis not present

## 2015-07-18 DIAGNOSIS — M545 Low back pain: Secondary | ICD-10-CM

## 2015-07-18 DIAGNOSIS — J45909 Unspecified asthma, uncomplicated: Secondary | ICD-10-CM

## 2015-07-18 DIAGNOSIS — M549 Dorsalgia, unspecified: Secondary | ICD-10-CM | POA: Insufficient documentation

## 2015-07-18 LAB — COMPREHENSIVE METABOLIC PANEL
ALT: 10 U/L (ref 0–35)
AST: 16 U/L (ref 0–37)
Albumin: 4.6 g/dL (ref 3.5–5.2)
Alkaline Phosphatase: 80 U/L (ref 39–117)
BUN: 18 mg/dL (ref 6–23)
CO2: 29 mEq/L (ref 19–32)
Calcium: 9.8 mg/dL (ref 8.4–10.5)
Chloride: 97 mEq/L (ref 96–112)
Creatinine, Ser: 0.62 mg/dL (ref 0.40–1.20)
GFR: 100.73 mL/min (ref >60.00)
Glucose, Bld: 92 mg/dL (ref 70–99)
Potassium: 4.4 mEq/L (ref 3.5–5.1)
Sodium: 138 mEq/L (ref 135–145)
Total Bilirubin: 0.5 mg/dL (ref 0.2–1.2)
Total Protein: 7.7 g/dL (ref 6.0–8.3)

## 2015-07-18 LAB — CBC WITH DIFFERENTIAL/PLATELET
Basophils Absolute: 0.1 10*3/uL (ref 0.0–0.1)
Basophils Relative: 0.6 % (ref 0.0–3.0)
Eosinophils Absolute: 0.2 10*3/uL (ref 0.0–0.7)
Eosinophils Relative: 1.4 % (ref 0.0–5.0)
HCT: 43.8 % (ref 36.0–46.0)
Hemoglobin: 14.7 g/dL (ref 12.0–15.0)
Lymphocytes Relative: 20 % (ref 12.0–46.0)
Lymphs Abs: 2.4 10*3/uL (ref 0.7–4.0)
MCHC: 33.5 g/dL (ref 30.0–36.0)
MCV: 102.3 fl — ABNORMAL HIGH (ref 78.0–100.0)
Monocytes Absolute: 0.9 10*3/uL (ref 0.1–1.0)
Monocytes Relative: 7.2 % (ref 3.0–12.0)
Neutro Abs: 8.5 10*3/uL — ABNORMAL HIGH (ref 1.4–7.7)
Neutrophils Relative %: 70.8 % (ref 43.0–77.0)
Platelets: 384 10*3/uL (ref 150.0–400.0)
RBC: 4.28 Mil/uL (ref 3.87–5.11)
RDW: 14.8 % (ref 11.5–15.5)
WBC: 12 10*3/uL — ABNORMAL HIGH (ref 4.0–10.5)

## 2015-07-18 LAB — LIPID PANEL
Cholesterol: 259 mg/dL — ABNORMAL HIGH (ref 0–200)
HDL: 132.9 mg/dL (ref >39.00)
LDL Cholesterol: 114 mg/dL — ABNORMAL HIGH (ref 0–99)
NonHDL: 126.53
Total CHOL/HDL Ratio: 2
Triglycerides: 65 mg/dL (ref 0.0–149.0)
VLDL: 13 mg/dL (ref 0.0–40.0)

## 2015-07-18 LAB — TSH: TSH: 2.74 u[IU]/mL (ref 0.35–4.50)

## 2015-07-18 LAB — THEOPHYLLINE LEVEL: Theophylline Lvl: 13.1 mg/L (ref 10.0–20.0)

## 2015-07-18 MED ORDER — PRAVASTATIN SODIUM 40 MG PO TABS: ORAL_TABLET | ORAL | Status: DC

## 2015-07-18 MED ORDER — BUDESONIDE-FORMOTEROL FUMARATE 160-4.5 MCG/ACT IN AERO: INHALATION_SPRAY | RESPIRATORY_TRACT | Status: DC

## 2015-07-18 NOTE — Progress Notes (Signed)
Subjective:    Patient ID: Crystal [name], female    DOB: [birth year], 70 y.o.   MRN: [id number]  HPI She is here to establish with a new pcp.  She is here for follow up.   Hypertension: She is taking her medication daily. She is compliant with a low sodium diet.  She denies chest pain, palpitations, edema, shortness of breath and regular headaches. She is exercising regularly- water, yoga, walking.  She does monitor her blood pressure at home - typically high 120's - low [id number] systolically.    COPD,Asthma:  She takes the symbicort and singulair daily and rarely has to use the rescur inhaler.  She denies coughing, wheezing, sob.    Hyperlipidemia: She is taking her medication daily. She is compliant with a low fat/cholesterol diet. She is exercising regularly. She denies myalgias.   Osteoarthritis, hands: she takes celevrex as neede,b ut it upsets he rIBS/cotmahc.  Back pain:  She had back sugery. She has eipidural shot on occasion.  She has daily pain.  She takes tramadol as needed, 1-2 times a week.  She also takes a muscle relxer as needed.    She sees Dr Alfonso Ramus.    Medications and allergies reviewed with patient and updated if appropriate.  Patient Active Problem List   Diagnosis Date Noted  . COPD exacerbation ([place]) 04/13/2014  . 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 HYPOXEMIA 06/05/2010  . Osteopenia 06/03/2008  . History of colonic polyps 06/03/2008  . INTENTION TREMOR 12/06/2007  . HEADACHE 11/27/2007  . IBS 10/16/2007  . GERD 09/21/2007  . Hyperlipidemia 06/06/2007  . Essential hypertension 06/06/2007  . Chronic obstructive airway disease with asthma ([place]) 02/23/2007    Current Outpatient Prescriptions on File Prior to Visit  Medication Sig Dispense Refill  . ALPRAZolam (XANAX) 0.5 MG tablet Take 1 tablet by mouth as needed. To help sleep    . budesonide-formoterol (SYMBICORT) 160-4.5 MCG/ACT inhaler INHALE 1 TO 2 PUFFS BY MOUTH EVERY 12  HOURS THEN GARGLE AND SPIT AFTERUSE 10.2 g 1  . Butenafine HCl 1 % cream Apply topically twice daily for seven days 15 g 1  . Calcium Carbonate-Vitamin D (CALCIUM 600/VITAMIN D PO) Take by mouth daily.      . Cholecalciferol (VITAMIN D3) 1000 UNITS CAPS Take by mouth. 3 by mouth once daily     . diclofenac sodium (VOLTAREN) 1 % GEL Apply topically at bedtime.    Marland Kitchen FLUOCINOLONE ACETONIDE BODY 0.01 % OIL Apply 1 drop topically as needed.    . fluocinonide (LIDEX) 0.05 % external solution Apply 1 application topically as needed.    . furosemide (LASIX) 40 MG tablet TAKE 1 TABLET BY MOUTH DAILY 90 tablet 3  . losartan (COZAAR) 50 MG tablet TAKE 1/2 TABLET BY MOUTH DAILY 45 tablet 3  . montelukast (SINGULAIR) 10 MG tablet TAKE 1 TABLET BY MOUTH ONCE DAILY 90 tablet 1  . pravastatin (PRAVACHOL) 40 MG tablet TAKE 1 TABLET DAILY BY MOUTH AT BEDTIME 90 tablet 3  . PROAIR HFA 108 (90 BASE) MCG/ACT inhaler INHALE 1 PUFF BY MOUTH AS DIRECTED IF NEEDED 25.5 g 5  . Probiotic Product (ALIGN) 4 MG CAPS Take by mouth daily.      . theophylline (THEODUR) 200 MG 12 hr tablet Take 1 tablet (200 mg total) by mouth every 12 (twelve) hours. 180 tablet 0  . theophylline (UNIPHYL) 400 MG 24 hr tablet Take 1 tablet (400 mg total) by mouth  daily. --patient has scheduled feb/2017 with new pcp--dr Kaycie Pegues 30 tablet 0  . traMADol (ULTRAM) 50 MG tablet Take 1 tablet (50 mg total) by mouth every 6 (six) hours as needed. 60 tablet 0  . trimethoprim (TRIMPEX) 100 MG tablet Take 1 tablet by mouth as needed. To prevent bladder problems when travels    . sertraline (ZOLOFT) 50 MG tablet 1/2 qd (Patient not taking: Reported on 07/18/2015) 30 tablet 2   No current facility-administered medications on file prior to visit.    Past Medical History  Diagnosis Date  . IBS (irritable bowel syndrome)   . Allergy     seasonal  . Hyperlipidemia   . Hypertension   . RAD (reactive airway disease)   . MVP (mitral valve prolapse)   .  Peripheral vascular disease ([place])   . Diverticulosis   . Cataract   . Adenomatous colon polyp 2007 & 2010     Dr [name]    Past Surgical History  Procedure Laterality Date  . Abdominal hysterectomy      with USO for dysfunctionall menses  . Appendectomy    . Lumbar laminectomy  2000    Dr Vertell Limber  . Tonsillectomy    . Shoulder surgery      R shoulder  . Colonoscopy w/ polypectomy       X2 ; diverticulosis  . Cataract extraction w/ intraocular lens implant  2013    bilateral; Dr [name]  . Colonoscopy  2013    negative , due 2018; Dr [name]    Social History   Social History  . Marital Status: Married    Spouse Name: N/A  . Number of Children: N/A  . Years of Education: N/A   Social History Main Topics  . Smoking status: Former Smoker -- 1.00 packs/day for 23 years    Quit date: 06/14/1989  . Smokeless tobacco: Never Used     Comment: smoked 1961-1991 , up to < 1 ppd  . Alcohol Use: 8.4 oz/week    14 Glasses of wine per week     Comment: Wine  . Drug Use: No  . Sexual Activity: Not Asked   Other Topics Concern  . None   Social History Narrative    Family History  Problem Relation Age of Onset  . Parkinsonism Mother   . Stroke Mother   . Heart attack Father     pre 45  . Diabetes Sister   . Heart attack Brother 67  . COPD Sister     X 3  . Colon cancer Neg Hx   . Cancer Neg Hx   . Other Son     suicide 2016    Review of Systems  Constitutional: Negative for fever and chills.  Respiratory: Negative for cough, shortness of breath and wheezing.   Cardiovascular: Positive for palpitations (occasional) and leg swelling (chronic). Negative for chest pain.  Gastrointestinal:       No GERD  Neurological: Positive for dizziness (rare, transient). Negative for light-headedness and headaches.  Psychiatric/Behavioral: Negative for dysphoric mood. The patient is not nervous/anxious.        Objective:   Filed Vitals:   07/18/15 0807  BP: 144/80  Pulse: 87    Temp: 97.9 F (36.6 C)  Resp: 16   Filed Weights   07/18/15 0807  Weight: 125 lb (56.7 kg)   Body mass index is 22.15 kg/(m^2).   Physical Exam Constitutional: Appears well-developed and well-nourished. No distress.  Neck: Neck supple.  No tracheal deviation present. No thyromegaly present.  No carotid bruit. No cervical adenopathy.   Cardiovascular: Normal rate, regular rhythm and normal heart sounds.   No murmur heard.  trace edema Pulmonary/Chest: Effort normal and breath sounds normal. No respiratory distress. No wheezes.        Assessment & Plan:   See Problem List for Assessment and Plan of chronic medical problems.  Follow up annually

## 2015-07-18 NOTE — Patient Instructions (Signed)
  We have reviewed your prior records including labs and tests today.  Test(s) ordered today. Your results will be released to [place] (or called to you) after review, usually within 72hours after test completion. If any changes need to be made, you will be notified at that same time.  All other Health Maintenance issues reviewed.   All recommended immunizations and age-appropriate screenings are up-to-date.  prevnar vaccine administered today.   Medications reviewed and updated.  No changes recommended at this time.  Your prescription(s) have been submitted to your pharmacy. Please take as directed and contact our office if you believe you are having problem(s) with the medication(s).   Please followup annually

## 2015-07-18 NOTE — Assessment & Plan Note (Signed)
Blood pressure controlled at home-she does monitor it regularly. Slightly elevated here today Continue to monitor at home Continue current medications Check CMP

## 2015-07-18 NOTE — Progress Notes (Signed)
Pre visit review using our clinic review tool, if applicable. No additional management support is needed unless otherwise documented below in the visit note. 

## 2015-07-18 NOTE — Assessment & Plan Note (Addendum)
She has chronic back pain that varies in intensity Takes tramadol as needed, an average 1-2 times a week She also takes a muscle relaxer as needed Following with Dr. Gerarda Gunther periodic epidural injections Will continue tramadol-she only takes this when needed

## 2015-07-18 NOTE — Assessment & Plan Note (Signed)
Uses Symbicort daily and rescue inhaler only as needed Taking theophylline-has been on this for a long time and has been very stable Check theophylline level May need to refer to pulmonary in the future given that she is on theophylline Stable, continue above

## 2015-07-18 NOTE — Assessment & Plan Note (Signed)
Due for bone density-ordered She is exercising regularly and will continue Taking calcium and vitamin D daily

## 2015-07-18 NOTE — Assessment & Plan Note (Signed)
Check lipid panel, CMP Taking pravastatin 40 mg daily

## 2015-07-19 ENCOUNTER — Encounter: Payer: Self-pay | Admitting: Internal Medicine

## 2015-07-30 ENCOUNTER — Telehealth: Payer: Self-pay | Admitting: Emergency Medicine

## 2015-07-30 ENCOUNTER — Other Ambulatory Visit: Payer: Self-pay | Admitting: Internal Medicine

## 2015-07-30 DIAGNOSIS — M810 Age-related osteoporosis without current pathological fracture: Secondary | ICD-10-CM | POA: Diagnosis not present

## 2015-07-30 DIAGNOSIS — M85851 Other specified disorders of bone density and structure, right thigh: Secondary | ICD-10-CM | POA: Diagnosis not present

## 2015-07-30 LAB — HM DEXA SCAN

## 2015-07-30 NOTE — Telephone Encounter (Signed)
Pt had Bone Density scheduled for 07/30/2015 at [facility] to give verbal orders. They were okay with waiting until Dr [name] returns to get signed orders.

## 2015-08-02 ENCOUNTER — Encounter: Payer: Self-pay | Admitting: Internal Medicine

## 2015-08-11 ENCOUNTER — Encounter: Payer: Self-pay | Admitting: Internal Medicine

## 2015-08-22 DIAGNOSIS — M545 Low back pain: Secondary | ICD-10-CM | POA: Diagnosis not present

## 2015-08-28 ENCOUNTER — Other Ambulatory Visit: Payer: Self-pay | Admitting: *Deleted

## 2015-08-28 MED ORDER — LOSARTAN POTASSIUM 50 MG PO TABS: 25.0000 mg | ORAL_TABLET | Freq: Every day | ORAL | Status: DC

## 2015-08-28 NOTE — Telephone Encounter (Signed)
Received call from pharmacist stating been trying to get pt Crystal [name] refill since 3/13. Verified chart pt trans from Dr. Linna Darner to Dr. [name] saw her 07/22/15. Gave verbal ok to refill. Updated med list.../lmb

## 2015-10-02 ENCOUNTER — Other Ambulatory Visit: Payer: Self-pay | Admitting: Emergency Medicine

## 2015-10-02 MED ORDER — FUROSEMIDE 40 MG PO TABS: 40.0000 mg | ORAL_TABLET | Freq: Every day | ORAL | Status: DC

## 2015-10-30 ENCOUNTER — Telehealth: Payer: Self-pay

## 2015-10-30 MED ORDER — MONTELUKAST SODIUM 10 MG PO TABS
10.0000 mg | ORAL_TABLET | Freq: Every day | ORAL | Status: DC
Start: 2015-10-30 — End: 2016-04-22

## 2015-10-30 NOTE — Telephone Encounter (Signed)
Spoke with pt, RX needs to be sent to [facility].

## 2015-10-30 NOTE — Telephone Encounter (Signed)
[facility] called and said they sent a request over for   montelukast (SINGULAIR) 10 MG tablet [id number]      And are still waiting on the refill. Could you please follow up.

## 2015-12-29 DIAGNOSIS — M25561 Pain in right knee: Secondary | ICD-10-CM | POA: Diagnosis not present

## 2015-12-29 DIAGNOSIS — M545 Low back pain: Secondary | ICD-10-CM | POA: Diagnosis not present

## 2015-12-31 DIAGNOSIS — M545 Low back pain: Secondary | ICD-10-CM | POA: Diagnosis not present

## 2016-01-05 DIAGNOSIS — Z1231 Encounter for screening mammogram for malignant neoplasm of breast: Secondary | ICD-10-CM | POA: Diagnosis not present

## 2016-01-05 DIAGNOSIS — Z6821 Body mass index (BMI) 21.0-21.9, adult: Secondary | ICD-10-CM | POA: Diagnosis not present

## 2016-01-05 DIAGNOSIS — Z01419 Encounter for gynecological examination (general) (routine) without abnormal findings: Secondary | ICD-10-CM | POA: Diagnosis not present

## 2016-01-05 LAB — HM MAMMOGRAPHY

## 2016-01-20 DIAGNOSIS — H5213 Myopia, bilateral: Secondary | ICD-10-CM | POA: Diagnosis not present

## 2016-01-21 DIAGNOSIS — M545 Low back pain: Secondary | ICD-10-CM | POA: Diagnosis not present

## 2016-01-21 DIAGNOSIS — M7061 Trochanteric bursitis, right hip: Secondary | ICD-10-CM | POA: Diagnosis not present

## 2016-02-18 ENCOUNTER — Other Ambulatory Visit: Payer: Self-pay | Admitting: Internal Medicine

## 2016-03-26 DIAGNOSIS — M1712 Unilateral primary osteoarthritis, left knee: Secondary | ICD-10-CM | POA: Diagnosis not present

## 2016-03-30 ENCOUNTER — Other Ambulatory Visit: Payer: Self-pay | Admitting: Internal Medicine

## 2016-04-13 ENCOUNTER — Other Ambulatory Visit: Payer: Self-pay | Admitting: *Deleted

## 2016-04-13 MED ORDER — ALBUTEROL SULFATE HFA 108 (90 BASE) MCG/ACT IN AERS: INHALATION_SPRAY | RESPIRATORY_TRACT | 2 refills | Status: DC

## 2016-04-19 DIAGNOSIS — M545 Low back pain: Secondary | ICD-10-CM | POA: Diagnosis not present

## 2016-04-19 DIAGNOSIS — M5116 Intervertebral disc disorders with radiculopathy, lumbar region: Secondary | ICD-10-CM | POA: Diagnosis not present

## 2016-04-22 ENCOUNTER — Other Ambulatory Visit: Payer: Self-pay | Admitting: Internal Medicine

## 2016-05-01 DIAGNOSIS — Z23 Encounter for immunization: Secondary | ICD-10-CM | POA: Diagnosis not present

## 2016-05-03 DIAGNOSIS — M5116 Intervertebral disc disorders with radiculopathy, lumbar region: Secondary | ICD-10-CM | POA: Diagnosis not present

## 2016-06-24 NOTE — Progress Notes (Signed)
Subjective:    Patient ID: Crystal [name], female    DOB: 04/11/1946, 71 y.o.   MRN: [id number]  HPI Here for medicare wellness exam and an annual physical exam.   I have personally reviewed and have noted 1.The patient's medical and social history 2.Their use of alcohol, tobacco or illicit drugs 3.Their current medications and supplements 4.The patient's functional ability including ADL's, fall risks, home safety risks and hearing or visual impairment. 5.Diet and physical activities 6.Evidence for depression or mood disorders 7.Care team reviewed  - gyn - Dr Williams Che, ortho - Dr Alfonso Ramus   Are there smokers in your home (other than you)? No  Risk Factors Exercise: water exercise, walking Dietary issues discussed: well balanced  Cardiac risk factors: advanced age, hypertension, hyperlipidemia  Depression Screen  Have you felt down, depressed or hopeless? No  Have you felt little interest or pleasure in doing things?  No  Activities of Daily Living In your present state of health, do you have any difficulty performing the following activities?:  Driving? No Managing money?  No Feeding yourself? No Getting from bed to chair? No Climbing a flight of stairs? No Preparing food and eating?: No Bathing or showering? No Getting dressed: No Getting to/using the toilet? No Moving around from place to place: No In the past year have you fallen or had a near fall?: No   Are you sexually active?  No  Do you have more than one partner?  N/A  Hearing Difficulties: No Do you often ask people to speak up or repeat themselves? No Do you experience ringing or noises in your ears? No Do you have difficulty understanding soft or whispered voices? No Vision:              Any change in vision: no              Up to date with eye exam:  Up to date  Memory:  Do you feel that you have a problem with memory? No  Do you often  misplace items? No  Do you feel safe at home?  Yes  Cognitive Testing  Alert, Orientated? Yes  Normal Appearance? Yes  Recall of three objects?  Yes  Can perform simple calculations? Yes  Displays appropriate judgment? Yes  Can read the correct time from a watch face? Yes   Advanced Directives have been discussed with the patient? Yes  - in place   Medications and allergies reviewed with patient and updated if appropriate.  Patient Active Problem List   Diagnosis Date Noted  . Osteoarthritis 06/25/2016  . Anxiety 06/25/2016  . Back pain 07/18/2015  . COPD exacerbation ([place]) 04/13/2014  . 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 HYPOXEMIA 06/05/2010  . Osteopenia 06/03/2008  . History of colonic polyps 06/03/2008  . INTENTION TREMOR 12/06/2007  . IBS 10/16/2007  . Hyperlipidemia 06/06/2007  . Essential hypertension 06/06/2007  . Chronic obstructive airway disease with asthma ([place]) 02/23/2007    Current Outpatient Prescriptions on File Prior to Visit  Medication Sig Dispense Refill  . albuterol (PROAIR HFA) 108 (90 Base) MCG/ACT inhaler INHALE 1 PUFF BY MOUTH AS DIRECTED IF NEEDED 25.5 g 2  . budesonide-formoterol (SYMBICORT) 160-4.5 MCG/ACT inhaler INHALE 1 TO 2 PUFFS BY MOUTH EVERY 12 HOURS THEN GARGLE AND SPIT AFTERUSE 10.2 g 12  . Butenafine HCl 1 % cream Apply topically twice daily for seven days 15 g 1  . Calcium Carbonate-Vitamin D (CALCIUM  600/VITAMIN D PO) Take by mouth daily.      . Cholecalciferol (VITAMIN D3) 1000 UNITS CAPS Take by mouth. 3 by mouth once daily     . diclofenac sodium (VOLTAREN) 1 % GEL Apply topically at bedtime.    Marland Kitchen FLUOCINOLONE ACETONIDE BODY 0.01 % OIL Apply 1 drop topically as needed.    . fluocinonide (LIDEX) 0.05 % external solution Apply 1 application topically as needed.    Marland Kitchen losartan (COZAAR) 50 MG tablet TAKE 0.5 TABLETS (25 MG TOTAL) BY MOUTH DAILY. 45 tablet 1  . montelukast (SINGULAIR) 10 MG tablet TAKE  ONE TABLET BY MOUTH DAILY 90 tablet 1  . pravastatin (PRAVACHOL) 40 MG tablet TAKE 1 TABLET DAILY BY MOUTH AT BEDTIME 90 tablet 3  . Probiotic Product (ALIGN) 4 MG CAPS Take by mouth daily.      . theophylline (UNIPHYL) 400 MG 24 hr tablet Take 1 tablet (400 mg total) by mouth daily. 90 tablet 3  . traMADol (ULTRAM) 50 MG tablet Take 1 tablet (50 mg total) by mouth every 6 (six) hours as needed. 60 tablet 0  . trimethoprim (TRIMPEX) 100 MG tablet Take 1 tablet by mouth as needed. To prevent bladder problems when travels     No current facility-administered medications on file prior to visit.     Past Medical History:  Diagnosis Date  . Adenomatous colon polyp 2007 & 2010    Dr [name]  . Allergy    seasonal  . Cataract   . Diverticulosis   . Hyperlipidemia   . Hypertension   . IBS (irritable bowel syndrome)   . MVP (mitral valve prolapse)   . Peripheral vascular disease ([place])   . RAD (reactive airway disease)     Past Surgical History:  Procedure Laterality Date  . ABDOMINAL HYSTERECTOMY     with USO for dysfunctionall menses  . APPENDECTOMY    . CATARACT EXTRACTION W/ INTRAOCULAR LENS IMPLANT  2013   bilateral; Dr [name]  . COLONOSCOPY  2013   negative , due 2018; Dr [name]  . COLONOSCOPY W/ POLYPECTOMY      X2 ; diverticulosis  . LUMBAR LAMINECTOMY  2000   Dr Vertell Limber  . SHOULDER SURGERY     R shoulder  . TONSILLECTOMY      Social History   Social History  . Marital status: Married    Spouse name: N/A  . Number of children: N/A  . Years of education: N/A   Social History Main Topics  . Smoking status: Former Smoker    Packs/day: 1.00    Years: 23.00    Quit date: 06/14/1989  . Smokeless tobacco: Never Used     Comment: smoked 1961-1991 , up to < 1 ppd  . Alcohol use 8.4 oz/week    14 Glasses of wine per week     Comment: Wine  . Drug use: No  . Sexual activity: Not on file   Other Topics Concern  . Not on file   Social History Narrative  . No narrative  on file    Family History  Problem Relation Age of Onset  . Parkinsonism Mother   . Stroke Mother   . Heart attack Father     pre 59  . Diabetes Sister   . Heart attack Brother 41  . COPD Sister     X 3  . Colon cancer Neg Hx   . Cancer Neg Hx   . Other Son  suicide 2016    Review of Systems  Constitutional: Negative for appetite change, chills, fatigue and fever.  HENT: Negative for hearing loss and tinnitus.   Eyes: Negative for visual disturbance.  Respiratory: Negative for cough, shortness of breath and wheezing.   Cardiovascular: Negative for chest pain, palpitations and leg swelling.  Gastrointestinal: Positive for abdominal pain (intermittent, with IBS) and diarrhea (IBS). Negative for blood in stool, constipation and nausea.       No gerd  Genitourinary: Negative for dysuria and hematuria.  Musculoskeletal: Negative for arthralgias and back pain.  Skin: Negative for color change and rash.  Neurological: Negative for light-headedness and headaches.  Psychiatric/Behavioral: Negative for dysphoric mood. The patient is nervous/anxious.        Objective:   Vitals:   06/25/16 0839  BP: (!) 144/82  Pulse: 83  Resp: 16  Temp: 97.9 F (36.6 C)   Filed Weights   06/25/16 0839  Weight: 129 lb (58.5 kg)   Body mass index is 22.85 kg/m.  Wt Readings from Last 3 Encounters:  06/25/16 129 lb (58.5 kg)  07/18/15 125 lb (56.7 kg)  12/13/14 127 lb (57.6 kg)     Physical Exam Constitutional: She appears well-developed and well-nourished. No distress.  HENT:  Head: Normocephalic and atraumatic.  Right Ear: External ear normal. Normal ear canal and TM Left Ear: External ear normal.  Normal ear canal and TM Mouth/Throat: Oropharynx is clear and moist.  Eyes: Conjunctivae and EOM are normal.  Neck: Neck supple. No tracheal deviation present. No thyromegaly present.  No carotid bruit  Cardiovascular: Normal rate, regular rhythm and normal heart sounds.   No  murmur heard.  No edema. Pulmonary/Chest: Effort normal and breath sounds normal. No respiratory distress. She has no wheezes. She has no rales.  Breast: deferred to Gyn Abdominal: Soft. She exhibits no distension. There is no tenderness.  Lymphadenopathy: She has no cervical adenopathy.  Skin: Skin is warm and dry. She is not diaphoretic.  Psychiatric: She has a normal mood and affect. Her behavior is normal.         Assessment & Plan:   Wellness Exam: Immunizations    Up to date  Colonoscopy  Up to date  Mammogram  Up to date by gyn Gyn  Up to date  Dexa  Up to date  Eye exam  Up to date  Hearing loss   none Memory concerns/difficulties  none Independent of ADLs   fully Stressed the importance of regular exercise   Patient received copy of preventative screening tests/immunizations recommended for the next 5-10 years.   Physical exam: Screening blood work    ordered Immunizations    Up to date  Colonoscopy  Up to date  Mammogram  Up to date by gyn Gyn  Up to date  Dexa  Up to date  Eye exams  Up to date  Exercise  - regular, will continue Weight - normal BMI Skin   No concerns Substance abuse  none  See Problem List for Assessment and Plan of chronic medical problems.   FU annually

## 2016-06-24 NOTE — Patient Instructions (Addendum)
Crystal [name] , Thank you for taking time to come for your Medicare Wellness Visit. I appreciate your ongoing commitment to your health goals. Please review the following plan we discussed and let me know if I can assist you in the future.   These are the goals we discussed: Goals    None      This is a list of the screening recommended for you and due dates:  Health Maintenance  Topic Date Due  .  Hepatitis C: One time screening is recommended by Center for Disease Control  (CDC) for  adults born from [birth year] through 1967.   12/23/1945  . Mammogram  11/13/2014  . Colon Cancer Screening  10/25/2016  . DEXA scan (bone density measurement)  07/29/2017  . Tetanus Vaccine  06/08/2022  . Flu Shot  Addressed  . Shingles Vaccine  Completed  . Pneumonia vaccines  Completed     Test(s) ordered today. Your results will be released to [facility] (or called to you) after review, usually within 72hours after test completion. If any changes need to be made, you will be notified at that same time.  All other Health Maintenance issues reviewed.   All recommended immunizations and age-appropriate screenings are up-to-date or discussed.  No immunizations administered today.   Medications reviewed and updated.  Changes include trying Pensaid to see if it is cheaper than voltaren gel.   Your prescription(s) have been submitted to your pharmacy. Please take as directed and contact our office if you believe you are having problem(s) with the medication(s).  Please followup in one year   Health Maintenance, Female Introduction Adopting a healthy lifestyle and getting preventive care can go a long way to promote health and wellness. Talk with your health care provider about what schedule of regular examinations is right for you. This is a good chance for you to check in with your provider about disease prevention and staying healthy. In between checkups, there are plenty of things you can do on your own.  Experts have done a lot of research about which lifestyle changes and preventive measures are most likely to keep you healthy. Ask your health care provider for more information. Weight and diet Eat a healthy diet  Be sure to include plenty of vegetables, fruits, low-fat dairy products, and lean protein.  Do not eat a lot of foods high in solid fats, added sugars, or salt.  Get regular exercise. This is one of the most important things you can do for your health.  Most adults should exercise for at least 150 minutes each week. The exercise should increase your heart rate and make you sweat (moderate-intensity exercise).  Most adults should also do strengthening exercises at least twice a week. This is in addition to the moderate-intensity exercise. Maintain a healthy weight  Body mass index (BMI) is a measurement that can be used to identify possible weight problems. It estimates body fat based on height and weight. Your health care provider can help determine your BMI and help you achieve or maintain a healthy weight.  For females 96 years of age and older:  A BMI below 18.5 is considered underweight.  A BMI of 18.5 to 24.9 is normal.  A BMI of 25 to 29.9 is considered overweight.  A BMI of 30 and above is considered obese. Watch levels of cholesterol and blood lipids  You should start having your blood tested for lipids and cholesterol at 73 years of age, then have this test  every 5 years.  You may need to have your cholesterol levels checked more often if:  Your lipid or cholesterol levels are high.  You are older than 73 years of age.  You are at high risk for heart disease. Cancer screening Lung Cancer  Lung cancer screening is recommended for adults 63-75 years old who are at high risk for lung cancer because of a history of smoking.  A yearly low-dose CT scan of the lungs is recommended for people who:  Currently smoke.  Have quit within the past 15 years.  Have  at least a 30-pack-year history of smoking. A pack year is smoking an average of one pack of cigarettes a day for 1 year.  Yearly screening should continue until it has been 15 years since you quit.  Yearly screening should stop if you develop a health problem that would prevent you from having lung cancer treatment. Breast Cancer  Practice breast self-awareness. This means understanding how your breasts normally appear and feel.  It also means doing regular breast self-exams. Let your health care provider know about any changes, no matter how small.  If you are in your 20s or 30s, you should have a clinical breast exam (CBE) by a health care provider every 1-3 years as part of a regular health exam.  If you are 51 or older, have a CBE every year. Also consider having a breast X-ray (mammogram) every year.  If you have a family history of breast cancer, talk to your health care provider about genetic screening.  If you are at high risk for breast cancer, talk to your health care provider about having an MRI and a mammogram every year.  Breast cancer gene (BRCA) assessment is recommended for women who have family members with BRCA-related cancers. BRCA-related cancers include:  Breast.  Ovarian.  Tubal.  Peritoneal cancers.  Results of the assessment will determine the need for genetic counseling and BRCA1 and BRCA2 testing. Cervical Cancer  Your health care provider may recommend that you be screened regularly for cancer of the pelvic organs (ovaries, uterus, and vagina). This screening involves a pelvic examination, including checking for microscopic changes to the surface of your cervix (Pap test). You may be encouraged to have this screening done every 3 years, beginning at age 52.  For women ages 54-65, health care providers may recommend pelvic exams and Pap testing every 3 years, or they may recommend the Pap and pelvic exam, combined with testing for human papilloma virus  (HPV), every 5 years. Some types of HPV increase your risk of cervical cancer. Testing for HPV may also be done on women of any age with unclear Pap test results.  Other health care providers may not recommend any screening for nonpregnant women who are considered low risk for pelvic cancer and who do not have symptoms. Ask your health care provider if a screening pelvic exam is right for you.  If you have had past treatment for cervical cancer or a condition that could lead to cancer, you need Pap tests and screening for cancer for at least 20 years after your treatment. If Pap tests have been discontinued, your risk factors (such as having a new sexual partner) need to be reassessed to determine if screening should resume. Some women have medical problems that increase the chance of getting cervical cancer. In these cases, your health care provider may recommend more frequent screening and Pap tests. Colorectal Cancer  This type of cancer can be  detected and often prevented.  Routine colorectal cancer screening usually begins at 73 years of age and continues through 73 years of age.  Your health care provider may recommend screening at an earlier age if you have risk factors for colon cancer.  Your health care provider may also recommend using home test kits to check for hidden blood in the stool.  A small camera at the end of a tube can be used to examine your colon directly (sigmoidoscopy or colonoscopy). This is done to check for the earliest forms of colorectal cancer.  Routine screening usually begins at age 51.  Direct examination of the colon should be repeated every 5-10 years through 73 years of age. However, you may need to be screened more often if early forms of precancerous polyps or small growths are found. Skin Cancer  Check your skin from head to toe regularly.  Tell your health care provider about any new moles or changes in moles, especially if there is a change in a  mole's shape or color.  Also tell your health care provider if you have a mole that is larger than the size of a pencil eraser.  Always use sunscreen. Apply sunscreen liberally and repeatedly throughout the day.  Protect yourself by wearing long sleeves, pants, a wide-brimmed hat, and sunglasses whenever you are outside. Heart disease, diabetes, and high blood pressure  High blood pressure causes heart disease and increases the risk of stroke. High blood pressure is more likely to develop in:  People who have blood pressure in the high end of the normal range (130-139/85-89 mm Hg).  People who are overweight or obese.  People who are African American.  If you are 37-44 years of age, have your blood pressure checked every 3-5 years. If you are 4 years of age or older, have your blood pressure checked every year. You should have your blood pressure measured twice-once when you are at a hospital or clinic, and once when you are not at a hospital or clinic. Record the average of the two measurements. To check your blood pressure when you are not at a hospital or clinic, you can use:  An automated blood pressure machine at a pharmacy.  A home blood pressure monitor.  If you are between 81 years and 30 years old, ask your health care provider if you should take aspirin to prevent strokes.  Have regular diabetes screenings. This involves taking a blood sample to check your fasting blood sugar level.  If you are at a normal weight and have a low risk for diabetes, have this test once every three years after 73 years of age.  If you are overweight and have a high risk for diabetes, consider being tested at a younger age or more often. Preventing infection Hepatitis B  If you have a higher risk for hepatitis B, you should be screened for this virus. You are considered at high risk for hepatitis B if:  You were born in a country where hepatitis B is common. Ask your health care provider  which countries are considered high risk.  Your parents were born in a high-risk country, and you have not been immunized against hepatitis B (hepatitis B vaccine).  You have HIV or AIDS.  You use needles to inject street drugs.  You live with someone who has hepatitis B.  You have had sex with someone who has hepatitis B.  You get hemodialysis treatment.  You take certain medicines for conditions,  including cancer, organ transplantation, and autoimmune conditions. Hepatitis C  Blood testing is recommended for:  Everyone born from [birth year] through 1967.  Anyone with known risk factors for hepatitis C. Sexually transmitted infections (STIs)  You should be screened for sexually transmitted infections (STIs) including gonorrhea and chlamydia if:  You are sexually active and are younger than 73 years of age.  You are older than 73 years of age and your health care provider tells you that you are at risk for this type of infection.  Your sexual activity has changed since you were last screened and you are at an increased risk for chlamydia or gonorrhea. Ask your health care provider if you are at risk.  If you do not have HIV, but are at risk, it may be recommended that you take a prescription medicine daily to prevent HIV infection. This is called pre-exposure prophylaxis (PrEP). You are considered at risk if:  You are sexually active and do not regularly use condoms or know the HIV status of your partner(s).  You take drugs by injection.  You are sexually active with a partner who has HIV. Talk with your health care provider about whether you are at high risk of being infected with HIV. If you choose to begin PrEP, you should first be tested for HIV. You should then be tested every 3 months for as long as you are taking PrEP. Pregnancy  If you are premenopausal and you may become pregnant, ask your health care provider about preconception counseling.  If you may become pregnant,  take 400 to 800 micrograms (mcg) of folic acid every day.  If you want to prevent pregnancy, talk to your health care provider about birth control (contraception). Osteoporosis and menopause  Osteoporosis is a disease in which the bones lose minerals and strength with aging. This can result in serious bone fractures. Your risk for osteoporosis can be identified using a bone density scan.  If you are 66 years of age or older, or if you are at risk for osteoporosis and fractures, ask your health care provider if you should be screened.  Ask your health care provider whether you should take a calcium or vitamin D supplement to lower your risk for osteoporosis.  Menopause may have certain physical symptoms and risks.  Hormone replacement therapy may reduce some of these symptoms and risks. Talk to your health care provider about whether hormone replacement therapy is right for you. Follow these instructions at home:  Schedule regular health, dental, and eye exams.  Stay current with your immunizations.  Do not use any tobacco products including cigarettes, chewing tobacco, or electronic cigarettes.  If you are pregnant, do not drink alcohol.  If you are breastfeeding, limit how much and how often you drink alcohol.  Limit alcohol intake to no more than 1 drink per day for nonpregnant women. One drink equals 12 ounces of beer, 5 ounces of wine, or 1 ounces of hard liquor.  Do not use street drugs.  Do not share needles.  Ask your health care provider for help if you need support or information about quitting drugs.  Tell your health care provider if you often feel depressed.  Tell your health care provider if you have ever been abused or do not feel safe at home. This information is not intended to replace advice given to you by your health care provider. Make sure you discuss any questions you have with your health care provider. Document Released: 12/14/2010 Document  Revised:  11/06/2015 Document Reviewed: 03/04/2015  2017 [employer]

## 2016-06-25 ENCOUNTER — Other Ambulatory Visit (INDEPENDENT_AMBULATORY_CARE_PROVIDER_SITE_OTHER): Payer: Medicare Other

## 2016-06-25 ENCOUNTER — Encounter: Payer: Self-pay | Admitting: Internal Medicine

## 2016-06-25 ENCOUNTER — Ambulatory Visit (INDEPENDENT_AMBULATORY_CARE_PROVIDER_SITE_OTHER): Payer: Medicare Other | Admitting: Internal Medicine

## 2016-06-25 VITALS — BP 144/82 | HR 83 | Temp 97.9°F | Resp 16 | Ht 63.0 in | Wt 129.0 lb

## 2016-06-25 DIAGNOSIS — M545 Low back pain: Secondary | ICD-10-CM

## 2016-06-25 DIAGNOSIS — M1990 Unspecified osteoarthritis, unspecified site: Secondary | ICD-10-CM | POA: Insufficient documentation

## 2016-06-25 DIAGNOSIS — M1991 Primary osteoarthritis, unspecified site: Secondary | ICD-10-CM | POA: Diagnosis not present

## 2016-06-25 DIAGNOSIS — Z0000 Encounter for general adult medical examination without abnormal findings: Secondary | ICD-10-CM

## 2016-06-25 DIAGNOSIS — M8580 Other specified disorders of bone density and structure, unspecified site: Secondary | ICD-10-CM

## 2016-06-25 DIAGNOSIS — F419 Anxiety disorder, unspecified: Secondary | ICD-10-CM

## 2016-06-25 DIAGNOSIS — Z1159 Encounter for screening for other viral diseases: Secondary | ICD-10-CM | POA: Diagnosis not present

## 2016-06-25 DIAGNOSIS — I10 Essential (primary) hypertension: Secondary | ICD-10-CM

## 2016-06-25 DIAGNOSIS — G8929 Other chronic pain: Secondary | ICD-10-CM

## 2016-06-25 LAB — LIPID PANEL
Cholesterol: 218 mg/dL — ABNORMAL HIGH (ref 0–200)
HDL: 116.2 mg/dL (ref >39.00)
LDL Cholesterol: 88 mg/dL (ref 0–99)
NonHDL: 101.47
Total CHOL/HDL Ratio: 2
Triglycerides: 65 mg/dL (ref 0.0–149.0)
VLDL: 13 mg/dL (ref 0.0–40.0)

## 2016-06-25 LAB — CBC WITH DIFFERENTIAL/PLATELET
Basophils Absolute: 0 10*3/uL (ref 0.0–0.1)
Basophils Relative: 0.4 % (ref 0.0–3.0)
Eosinophils Absolute: 0.2 10*3/uL (ref 0.0–0.7)
Eosinophils Relative: 2 % (ref 0.0–5.0)
HCT: 39.1 % (ref 36.0–46.0)
Hemoglobin: 13.3 g/dL (ref 12.0–15.0)
Lymphocytes Relative: 20.5 % (ref 12.0–46.0)
Lymphs Abs: 2.3 10*3/uL (ref 0.7–4.0)
MCHC: 34.1 g/dL (ref 30.0–36.0)
MCV: 98.5 fl (ref 78.0–100.0)
Monocytes Absolute: 1.1 10*3/uL — ABNORMAL HIGH (ref 0.1–1.0)
Monocytes Relative: 10.2 % (ref 3.0–12.0)
Neutro Abs: 7.4 10*3/uL (ref 1.4–7.7)
Neutrophils Relative %: 66.9 % (ref 43.0–77.0)
Platelets: 395 10*3/uL (ref 150.0–400.0)
RBC: 3.97 Mil/uL (ref 3.87–5.11)
RDW: 14.4 % (ref 11.5–15.5)
WBC: 11.1 10*3/uL — ABNORMAL HIGH (ref 4.0–10.5)

## 2016-06-25 LAB — COMPREHENSIVE METABOLIC PANEL
ALT: 11 U/L (ref 0–35)
AST: 24 U/L (ref 0–37)
Albumin: 4.4 g/dL (ref 3.5–5.2)
Alkaline Phosphatase: 82 U/L (ref 39–117)
BUN: 18 mg/dL (ref 6–23)
CO2: 31 mEq/L (ref 19–32)
Calcium: 9.8 mg/dL (ref 8.4–10.5)
Chloride: 98 mEq/L (ref 96–112)
Creatinine, Ser: 0.72 mg/dL (ref 0.40–1.20)
GFR: 84.54 mL/min (ref >60.00)
Glucose, Bld: 95 mg/dL (ref 70–99)
Potassium: 4.7 mEq/L (ref 3.5–5.1)
Sodium: 139 mEq/L (ref 135–145)
Total Bilirubin: 0.5 mg/dL (ref 0.2–1.2)
Total Protein: 7.2 g/dL (ref 6.0–8.3)

## 2016-06-25 LAB — TSH: TSH: 2.42 u[IU]/mL (ref 0.35–4.50)

## 2016-06-25 MED ORDER — CELEBREX 200 MG PO CAPS: 200.0000 mg | ORAL_CAPSULE | Freq: Every day | ORAL | Status: DC

## 2016-06-25 MED ORDER — PENNSAID 2 % TD SOLN: TRANSDERMAL | 5 refills | Status: DC

## 2016-06-25 MED ORDER — FUROSEMIDE 40 MG PO TABS: 40.0000 mg | ORAL_TABLET | Freq: Every day | ORAL | 3 refills | Status: DC

## 2016-06-25 NOTE — Assessment & Plan Note (Signed)
Repeat dexa next year Continue cal, vitam d continue regular exercise

## 2016-06-25 NOTE — Assessment & Plan Note (Signed)
Checks her BP at home - typically lower BP well controlled Current regimen effective and well tolerated Continue current medications at current doses cmp

## 2016-06-25 NOTE — Progress Notes (Signed)
Pre visit review using our clinic review tool, if applicable. No additional management support is needed unless otherwise documented below in the visit note. 

## 2016-06-25 NOTE — Assessment & Plan Note (Signed)
Several joints, back Takes celebrex daily as needed

## 2016-06-25 NOTE — Assessment & Plan Note (Signed)
Had injection in Fall 2017 - sciatica pain resolved Still with lower back pain L4-5

## 2016-06-25 NOTE — Assessment & Plan Note (Addendum)
Use as needed for anxiety for sleep Rarely takes it continue

## 2016-06-26 ENCOUNTER — Encounter: Payer: Self-pay | Admitting: Internal Medicine

## 2016-06-26 LAB — HEPATITIS C ANTIBODY: HCV Ab: NEGATIVE

## 2016-07-19 ENCOUNTER — Other Ambulatory Visit: Payer: Self-pay | Admitting: Internal Medicine

## 2016-07-27 ENCOUNTER — Other Ambulatory Visit: Payer: Self-pay | Admitting: Internal Medicine

## 2016-08-17 ENCOUNTER — Other Ambulatory Visit: Payer: Self-pay | Admitting: Internal Medicine

## 2016-08-23 DIAGNOSIS — M545 Low back pain: Secondary | ICD-10-CM | POA: Diagnosis not present

## 2016-09-08 ENCOUNTER — Encounter: Payer: Self-pay | Admitting: Gastroenterology

## 2016-09-08 ENCOUNTER — Other Ambulatory Visit: Payer: Self-pay | Admitting: Internal Medicine

## 2016-10-06 ENCOUNTER — Encounter: Payer: Self-pay | Admitting: Internal Medicine

## 2016-10-06 ENCOUNTER — Ambulatory Visit (INDEPENDENT_AMBULATORY_CARE_PROVIDER_SITE_OTHER): Payer: Medicare Other | Admitting: Internal Medicine

## 2016-10-06 VITALS — BP 146/72 | HR 81 | Temp 97.5°F | Resp 16 | Wt 128.0 lb

## 2016-10-06 DIAGNOSIS — K1120 Sialoadenitis, unspecified: Secondary | ICD-10-CM | POA: Insufficient documentation

## 2016-10-06 NOTE — Progress Notes (Signed)
Subjective:    Patient ID: Crystal [name], female    DOB: 1945/07/12, 71 y.o.   MRN: [id number]  HPI She is here for an acute visit for cold symptoms.   Her symptoms started 9 days ago.    She is experiencing left jaw/ear/neck pain.  She noticed it when she was putting make up on.  She had soreness in the left jaw area and it looked a little swollen.  She went to urgent care and her ears were cleaned out - she was told she had a left ear infection and was put on amoxicillin. She still has pain in her upper left neck/jaw region and her inner ear feels funny.  She denies ear pain and has not discharge.  She has allergies and has her typical allergy symptoms.   She has TMJ and this feels different.  She usually does not get ear infections.  Medications and allergies reviewed with patient and updated if appropriate.  Patient Active Problem List   Diagnosis Date Noted  . Osteoarthritis 06/25/2016  . Anxiety 06/25/2016  . Back pain 07/18/2015  . COPD exacerbation ([place]) 04/13/2014  . 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 Osteopenia 06/03/2008  . History of colonic polyps 06/03/2008  . INTENTION TREMOR 12/06/2007  . IBS 10/16/2007  . Hyperlipidemia 06/06/2007  . Essential hypertension 06/06/2007  . Chronic obstructive airway disease with asthma ([place]) 02/23/2007    Current Outpatient Prescriptions on File Prior to Visit  Medication Sig Dispense Refill  . albuterol (PROAIR HFA) 108 (90 Base) MCG/ACT inhaler INHALE 1 PUFF BY MOUTH AS DIRECTED IF NEEDED 25.5 g 2  . ALPRAZolam (XANAX) 0.25 MG tablet Take 0.25 mg by mouth at bedtime as needed for anxiety.    . Butenafine HCl 1 % cream Apply topically twice daily for seven days 15 g 1  . Calcium Carbonate-Vitamin D (CALCIUM 600/VITAMIN D PO) Take by mouth daily.      . CELEBREX 200 MG capsule Take 1 capsule (200 mg total) by mouth daily.    . Cholecalciferol (VITAMIN D3) 1000 UNITS CAPS Take by mouth. 3 by mouth  once daily     . diclofenac sodium (VOLTAREN) 1 % GEL Apply topically at bedtime.    Marland Kitchen FLUOCINOLONE ACETONIDE BODY 0.01 % OIL Apply 1 drop topically as needed.    . fluocinonide (LIDEX) 0.05 % external solution Apply 1 application topically as needed.    . furosemide (LASIX) 40 MG tablet Take 1 tablet (40 mg total) by mouth daily. 90 tablet 3  . losartan (COZAAR) 50 MG tablet TAKE 0.5 TABLETS (25 MG TOTAL) BY MOUTH DAILY. 45 tablet 2  . montelukast (SINGULAIR) 10 MG tablet TAKE ONE TABLET BY MOUTH DAILY 90 tablet 1  . PENNSAID 2 % SOLN Apply twice daily 112 g 5  . pravastatin (PRAVACHOL) 40 MG tablet TAKE 1 TABLET DAILY BY MOUTH AT BEDTIME 90 tablet 2  . Probiotic Product (ALIGN) 4 MG CAPS Take by mouth daily.      . SYMBICORT 160-4.5 MCG/ACT inhaler INHALE 1 TO 2 PUFFS BY MOUTH EVERY 12 HOURS THEN GARGLE AND SPIT AFTER USE 10.2 g 11  . theophylline (UNIPHYL) 400 MG 24 hr tablet TAKE 1 TABLET (400 MG TOTAL) BY MOUTH DAILY. 90 tablet 3  . traMADol (ULTRAM) 50 MG tablet Take 1 tablet (50 mg total) by mouth every 6 (six) hours as needed. 60 tablet 0  . trimethoprim (TRIMPEX) 100 MG tablet Take 1  tablet by mouth as needed. To prevent bladder problems when travels     No current facility-administered medications on file prior to visit.     Past Medical History:  Diagnosis Date  . Adenomatous colon polyp 2007 & 2010    Dr [name]  . Allergy    seasonal  . Cataract   . Diverticulosis   . Hyperlipidemia   . Hypertension   . IBS (irritable bowel syndrome)   . MVP (mitral valve prolapse)   . Peripheral vascular disease ([place])   . RAD (reactive airway disease)     Past Surgical History:  Procedure Laterality Date  . ABDOMINAL HYSTERECTOMY     with USO for dysfunctionall menses  . APPENDECTOMY    . CATARACT EXTRACTION W/ INTRAOCULAR LENS IMPLANT  2013   bilateral; Dr [name]  . COLONOSCOPY  2013   negative , due 2018; Dr [name]  . COLONOSCOPY W/ POLYPECTOMY      X2 ; diverticulosis  .  LUMBAR LAMINECTOMY  2000   Dr Vertell Limber  . SHOULDER SURGERY     R shoulder  . TONSILLECTOMY      Social History   Social History  . Marital status: Married    Spouse name: N/A  . Number of children: N/A  . Years of education: N/A   Social History Main Topics  . Smoking status: Former Smoker    Packs/day: 1.00    Years: 23.00    Quit date: 06/14/1989  . Smokeless tobacco: Never Used     Comment: smoked 1961-1991 , up to < 1 ppd  . Alcohol use 8.4 oz/week    14 Glasses of wine per week     Comment: Wine  . Drug use: No  . Sexual activity: Not Asked   Other Topics Concern  . None   Social History Narrative  . None    Family History  Problem Relation Age of Onset  . Parkinsonism Mother   . Stroke Mother   . Heart attack Father     pre 20  . Diabetes Sister   . Heart attack Brother 56  . COPD Sister     X 3  . Colon cancer Neg Hx   . Cancer Neg Hx   . Other Son     suicide 2016    Review of Systems  Constitutional: Negative for chills and fever.  HENT: Positive for congestion (allergy related), postnasal drip (allergies) and sore throat (mild). Negative for ear discharge, ear pain (feels funny), sinus pain and sinus pressure.   Respiratory: Negative for cough, shortness of breath and wheezing.   Gastrointestinal: Negative for nausea.  Neurological: Positive for headaches. Negative for dizziness and light-headedness.       Objective:   Vitals:   10/06/16 0933  BP: (!) 146/72  Pulse: 81  Resp: 16  Temp: 97.5 F (36.4 C)   Filed Weights   10/06/16 0933  Weight: 128 lb (58.1 kg)   Body mass index is 22.67 kg/m.  Wt Readings from Last 3 Encounters:  10/06/16 128 lb (58.1 kg)  06/25/16 129 lb (58.5 kg)  07/18/15 125 lb (56.7 kg)     Physical Exam GENERAL APPEARANCE: Appears stated age, well appearing, NAD EYES: conjunctiva clear, no icterus HEENT: bilateral tympanic membranes and ear canals normal, oropharynx with no erythema, minimal tenderness  left angle of the jaw overlying parotid without redness or swelling; no thyromegaly, trachea midline, no cervical or supraclavicular lymphadenopathy LUNGS: Clear to auscultation without wheeze  or crackles, unlabored breathing, good air entry bilaterally HEART: Normal S1,S2 without murmurs EXTREMITIES: Without clubbing, cyanosis, or edema        Assessment & Plan:   See Problem List for Assessment and Plan of chronic medical problems.

## 2016-10-06 NOTE — Patient Instructions (Addendum)
Increase water intake, eat lemon candy or suck on a lemon.   Monitor for signs of infection or call if your symptoms do not continue to improve.  Parotitis Parotitis is irritation and swelling (inflammation) of one or both of your parotid glands. These glands produce saliva. They are found on each side of your face, below and in front of your earlobes. The saliva that they produce comes out of tiny openings (ducts) inside your cheeks. Parotitis may cause sudden swelling and pain (acute parotitis). It can also cause repeated episodes of swelling and pain or continued swelling that may or may not be painful (chronic parotitis). What are the causes? Causes of this condition include:  Bacterial infections.  Viral infections, such as mumps and HIV.  Blockage (obstruction) of saliva flow through the parotid glands. This can be from a stone, scar tissue, or tumor.  Diseases that cause your body's defense system to attack salivary glands and cause inflammation (autoimmune diseases). What increases the risk? This condition is more likely to develop in:  People who are 42 or older.  People who do not drink enough fluids (dehydration).  People who drink too much alcohol.  People who have a dry mouth.  People who have poor dental hygiene.  People who have diabetes.  People who have gout.  People who have a long-term illness.  People who have had X-ray treatments to the head and neck.  People who take certain medicines. What are the signs or symptoms? Symptoms of this condition depend on the cause. Symptoms may include:  Swelling under and in front of the ear. This may get worse after eating.  Redness of the skin over the parotid gland.  Pain and tenderness over the parotid gland. This may get worse after eating.  Fever or chills.  Pus coming from the ducts inside the mouth.  Dry mouth.  A bad taste in the mouth. How is this diagnosed? This condition is diagnosed with a  medical history and physical exam. You may also have tests to find the cause of parotitis. These tests may include:  Doing blood tests to check for autoimmune disease or a viral infection.  Taking a sample of fluid from the parotid gland to test for infection.  Injecting the ducts of the gland with a dye before taking X-rays (sialogram).  Having other imaging studies of the gland, including X-rays, ultrasound, MRI, or CT scan.  Checking the opening of the gland for a stone or obstruction.  Placing a needle into the gland to remove tissue for a biopsy (fine needle aspiration). How is this treated? Treatment for this condition depends on the cause. Treatment may include:  Antibiotic medicine for a bacterial infection.  Drinking more fluids.  Removing a stone or obstruction.  Treating an underlying disease that is causing parotitis.  Surgery to drain an infection, remove a growth, or remove the whole gland (parotidectomy). Treatment may not be needed if parotid swelling goes away with home care. Follow these instructions at home: Medicines   Take over-the-counter and prescription medicines only as told by your health care provider.  If you were prescribed an antibiotic, take it as told by your health care provider. Do not stop taking the antibiotic even if you start to feel better. Managing pain and swelling   Apply warm compresses to the affected area as told by your health care provider.  Gently massage the parotid glands as told by your health care provider. General instructions  Drink enough fluid to keep your urine clear or pale yellow.  Try sucking on sour candy. This may help to make your mouth less dry and may stimulate the flow of saliva.  Keep your mouth clean and moist. Gargle with a salt-water mixture 3-4 times per day, or as needed. To make a salt-water mixture, completely dissolve -1 tsp of salt in 1 cup of warm water.  Maintain good oral health.  Brush  your teeth at least two times per day.  Floss your teeth every day.  See your dentist regularly.  Do not use tobacco products, including cigarettes, chewing tobacco, or e-cigarettes. If you need help quitting, ask your health care provider.  Keep all follow-up visits as told by your health care provider. This is important. Contact a health care provider if:  You have a fever or chills.  You have new symptoms.  Your symptoms get worse.  Your symptoms do not improve with treatment. This information is not intended to replace advice given to you by your health care provider. Make sure you discuss any questions you have with your health care provider. Document Released: 11/20/2001 Document Revised: 11/06/2015 Document Reviewed: 10/24/2014 Elsevier Interactive Patient Education  2017 [employer].

## 2016-10-06 NOTE — Assessment & Plan Note (Signed)
Left sided parotitis Mild in nature, no evidence of infection Increase water, suck on lemon candy, water compresses and massage area Monitor for signs of infection - call If no improvement  - call - will consider imaging to r/o stone Information given

## 2016-10-06 NOTE — Progress Notes (Signed)
Pre visit review using our clinic review tool, if applicable. No additional management support is needed unless otherwise documented below in the visit note. 

## 2016-10-25 ENCOUNTER — Other Ambulatory Visit: Payer: Self-pay | Admitting: Internal Medicine

## 2016-11-10 ENCOUNTER — Encounter: Payer: Self-pay | Admitting: Gastroenterology

## 2016-12-23 ENCOUNTER — Ambulatory Visit (AMBULATORY_SURGERY_CENTER): Payer: Self-pay

## 2016-12-23 VITALS — Ht 63.0 in | Wt 130.8 lb

## 2016-12-23 DIAGNOSIS — Z860100 Personal history of colon polyps, unspecified: Secondary | ICD-10-CM

## 2016-12-23 MED ORDER — NA SULFATE-K SULFATE-MG SULF 17.5-3.13-1.6 GM/177ML PO SOLN: 1.0000 | Freq: Once | ORAL | 0 refills | Status: AC

## 2016-12-23 NOTE — Progress Notes (Signed)
Denies allergies to eggs or soy products. Denies complication of anesthesia or sedation. Denies use of weight loss medication. Denies use of O2.   Emmi instructions declined.  

## 2016-12-28 ENCOUNTER — Encounter: Payer: Self-pay | Admitting: Gastroenterology

## 2017-01-05 ENCOUNTER — Ambulatory Visit (AMBULATORY_SURGERY_CENTER): Payer: Medicare Other | Admitting: Gastroenterology

## 2017-01-05 ENCOUNTER — Encounter: Payer: Self-pay | Admitting: Gastroenterology

## 2017-01-05 VITALS — BP 127/85 | HR 79 | Temp 98.6°F | Resp 19 | Ht 63.0 in | Wt 130.0 lb

## 2017-01-05 DIAGNOSIS — I10 Essential (primary) hypertension: Secondary | ICD-10-CM | POA: Diagnosis not present

## 2017-01-05 DIAGNOSIS — Z86010 Personal history of colonic polyps: Secondary | ICD-10-CM

## 2017-01-05 DIAGNOSIS — J449 Chronic obstructive pulmonary disease, unspecified: Secondary | ICD-10-CM | POA: Diagnosis not present

## 2017-01-05 DIAGNOSIS — I739 Peripheral vascular disease, unspecified: Secondary | ICD-10-CM | POA: Diagnosis not present

## 2017-01-05 MED ORDER — SODIUM CHLORIDE 0.9 % IV SOLN: 500.0000 mL | INTRAVENOUS | Status: DC

## 2017-01-05 NOTE — Progress Notes (Signed)
Report to PACU, RN, vss, BBS= Clear.  

## 2017-01-05 NOTE — Patient Instructions (Signed)
YOU HAD AN ENDOSCOPIC PROCEDURE TODAY AT [facility]:   Refer to the procedure report that was given to you for any specific questions about what was found during the examination.  If the procedure report does not answer your questions, please call your gastroenterologist to clarify.  If you requested that your care partner not be given the details of your procedure findings, then the procedure report has been included in a sealed envelope for you to review at your convenience later.  YOU SHOULD EXPECT: Some feelings of bloating in the abdomen. Passage of more gas than usual.  Walking can help get rid of the air that was put into your GI tract during the procedure and reduce the bloating. If you had a lower endoscopy (such as a colonoscopy or flexible sigmoidoscopy) you may notice spotting of blood in your stool or on the toilet paper. If you underwent a bowel prep for your procedure, you may not have a normal bowel movement for a few days.  Please Note:  You might notice some irritation and congestion in your nose or some drainage.  This is from the oxygen used during your procedure.  There is no need for concern and it should clear up in a day or so.  SYMPTOMS TO REPORT IMMEDIATELY:   Following lower endoscopy (colonoscopy or flexible sigmoidoscopy):  Excessive amounts of blood in the stool  Significant tenderness or worsening of abdominal pains  Swelling of the abdomen that is new, acute  Fever of 100F or higher   For urgent or emergent issues, a gastroenterologist can be reached at any hour by calling [phone].   DIET:  We do recommend a small meal at first, but then you may proceed to your regular diet.  Drink plenty of fluids but you should avoid alcoholic beverages for 24 hours.  ACTIVITY:  You should plan to take it easy for the rest of today and you should NOT DRIVE or use heavy machinery until tomorrow (because of the sedation medicines used during the test).     FOLLOW UP: Our staff will call the number listed on your records the next business day following your procedure to check on you and address any questions or concerns that you may have regarding the information given to you following your procedure. If we do not reach you, we will leave a message.  However, if you are feeling well and you are not experiencing any problems, there is no need to return our call.  We will assume that you have returned to your regular daily activities without incident.  If any biopsies were taken you will be contacted by phone or by letter within the next 1-3 weeks.  Please call us at [phone] if you have not heard about the biopsies in 3 weeks.    SIGNATURES/CONFIDENTIALITY: You and/or your care partner have signed paperwork which will be entered into your electronic medical record.  These signatures attest to the fact that that the information above on your After Visit Summary has been reviewed and is understood.  Full responsibility of the confidentiality of this discharge information lies with you and/or your care-partner.  Diverticulosis and high fiber diet information given.  No repeat colonoscopy due to absence of colon polyps on last 2 colonoscopies.

## 2017-01-05 NOTE — Op Note (Signed)
[facility] Patient Name: Crystal [name] Procedure Date: 01/05/2017 11:01 AM MRN: [id number] Endoscopist: Ladene Artist , MD Age: 71 Referring MD:  Date of Birth: Sep 08, 1945 Gender: Female Account #: [id number] Procedure:                Colonoscopy Indications:              Surveillance: Personal history of adenomatous                            polyps on last colonoscopy 5 years ago Medicines:                Monitored Anesthesia Care Procedure:                Pre-Anesthesia Assessment:                           - Prior to the procedure, a History and Physical                            was performed, and patient medications and                            allergies were reviewed. The patient's tolerance of                            previous anesthesia was also reviewed. The risks                            and benefits of the procedure and the sedation                            options and risks were discussed with the patient.                            All questions were answered, and informed consent                            was obtained. Prior Anticoagulants: The patient has                            taken no previous anticoagulant or antiplatelet                            agents. ASA Grade Assessment: II - A patient with                            mild systemic disease. After reviewing the risks                            and benefits, the patient was deemed in                            satisfactory condition to undergo the procedure.  After obtaining informed consent, the colonoscope                            was passed under direct vision. Throughout the                            procedure, the patient's blood pressure, pulse, and                            oxygen saturations were monitored continuously. The                            Colonoscope was introduced through the anus and                            advanced to the the cecum,  identified by                            appendiceal orifice and ileocecal valve. The                            ileocecal valve, appendiceal orifice, and rectum                            were photographed. The quality of the bowel                            preparation was adequate. The colonoscopy was                            technically difficult and complex due to multiple                            diverticula with stenosis, sharp angulation in the                            left colon. Successful completion of the procedure                            was aided by changing the patient's position,                            withdrawing and reinserting the scope, withdrawing                            the scope and replacing with the adult endoscope                            and straightening and shortening the scope to                            obtain bowel loop reduction. The patient tolerated  the procedure well. Scope In: 11:08:52 AM Scope Out: 11:37:20 AM Scope Withdrawal Time: 0 hours 10 minutes 20 seconds  Total Procedure Duration: 0 hours 28 minutes 28 seconds  Findings:                 The perianal and digital rectal examinations were                            normal.                           Multiple medium-mouthed diverticula were found in                            the left colon. There was narrowing of the colon in                            association with the diverticular opening. There                            was evidence of diverticular spasm and several                            turns with a sharp angle. Peri-diverticular                            erythema was seen. There was evidence of an                            impacted diverticulum. There was no evidence of                            diverticular bleeding.                           The exam was otherwise without abnormality on                            direct and  retroflexion views. Complications:            No immediate complications. Estimated blood loss:                            None. Estimated Blood Loss:     Estimated blood loss: none. Impression:               - Severe diverticulosis in the left colon. There                            was narrowing of the colon in association with the                            diverticular opening. There was evidence of                            diverticular spasm. Peri-diverticular erythema was  seen. There was evidence of an impacted                            diverticulum. There was no evidence of diverticular                            bleeding.                           - The examination was otherwise normal on direct                            and retroflexion views.                           - No specimens collected. Recommendation:           - Patient has a contact number available for                            emergencies. The signs and symptoms of potential                            delayed complications were discussed with the                            patient. Return to normal activities tomorrow.                            Written discharge instructions were provided to the                            patient.                           - High fiber diet.                           - Continue present medications.                           - No repeat colonoscopy due to the absence of                            colonic polyps on last 2 colonoscopies. Ladene Artist, MD 01/05/2017 11:42:53 AM This report has been signed electronically.

## 2017-01-06 ENCOUNTER — Telehealth: Payer: Self-pay

## 2017-01-06 NOTE — Telephone Encounter (Signed)
  Follow up Call-  Call back number 01/05/2017  Post procedure Call Back phone  # [phone]  Permission to leave phone message Yes  Some recent data might be hidden     Patient questions:  Do you have a fever, pain , or abdominal swelling? No. Pain Score  0 *  Have you tolerated food without any problems? Yes.    Have you been able to return to your normal activities? Yes.    Do you have any questions about your discharge instructions: Diet   No. Medications  No. Follow up visit  No.  Do you have questions or concerns about your Care? No.  Actions: * If pain score is 4 or above: No action needed, pain <4.

## 2017-01-10 DIAGNOSIS — Z6822 Body mass index (BMI) 22.0-22.9, adult: Secondary | ICD-10-CM | POA: Diagnosis not present

## 2017-01-10 DIAGNOSIS — N898 Other specified noninflammatory disorders of vagina: Secondary | ICD-10-CM | POA: Diagnosis not present

## 2017-01-10 DIAGNOSIS — Z01419 Encounter for gynecological examination (general) (routine) without abnormal findings: Secondary | ICD-10-CM | POA: Diagnosis not present

## 2017-01-10 DIAGNOSIS — Z1231 Encounter for screening mammogram for malignant neoplasm of breast: Secondary | ICD-10-CM | POA: Diagnosis not present

## 2017-01-25 ENCOUNTER — Encounter: Payer: Self-pay | Admitting: Internal Medicine

## 2017-02-10 DIAGNOSIS — H5213 Myopia, bilateral: Secondary | ICD-10-CM | POA: Diagnosis not present

## 2017-02-16 DIAGNOSIS — M545 Low back pain: Secondary | ICD-10-CM | POA: Diagnosis not present

## 2017-04-01 DIAGNOSIS — Z23 Encounter for immunization: Secondary | ICD-10-CM | POA: Diagnosis not present

## 2017-05-13 ENCOUNTER — Other Ambulatory Visit: Payer: Self-pay | Admitting: Internal Medicine

## 2017-05-24 ENCOUNTER — Other Ambulatory Visit: Payer: Self-pay | Admitting: Internal Medicine

## 2017-06-03 ENCOUNTER — Other Ambulatory Visit: Payer: Self-pay | Admitting: Internal Medicine

## 2017-06-03 ENCOUNTER — Telehealth: Payer: Self-pay | Admitting: Internal Medicine

## 2017-06-03 MED ORDER — VENTOLIN HFA 108 (90 BASE) MCG/ACT IN AERS: 1.0000 | INHALATION_SPRAY | Freq: Four times a day (QID) | RESPIRATORY_TRACT | 8 refills | Status: DC | PRN

## 2017-06-03 NOTE — Telephone Encounter (Signed)
Copied from [place] [id number]. Topic: Quick Communication - Rx Refill/Question >> Jun 03, 2017 11:07 AM Robina Ade, Helene Kelp D wrote: Has the patient contacted their pharmacy? Yes (Agent: If no, request that the patient contact the pharmacy for the refill.) Preferred Pharmacy (with phone number or street name): [facility] [address] [place] [address] Agent: Please be advised that RX refills may take up to 3 business days. We ask that you follow-up with your pharmacy. Pharmacy called and said that patient insurance will not cover albuterol (PROAIR HFA) 108 (90 Base) MCG/ACT inhaler and would like to change it for something else. Please call them back, thanks.

## 2017-06-03 NOTE — Telephone Encounter (Signed)
Will try ventolin - sent to pharm[facility]

## 2017-06-03 NOTE — Telephone Encounter (Signed)
Pt asking if PROAIR could be changed to another medication due to insurance not covering the medication.

## 2017-06-04 ENCOUNTER — Ambulatory Visit: Payer: Medicare Other | Admitting: Family Medicine

## 2017-06-04 ENCOUNTER — Encounter: Payer: Self-pay | Admitting: Family Medicine

## 2017-06-04 VITALS — BP 128/68 | HR 84 | Temp 98.3°F | Resp 16 | Ht 63.0 in | Wt 125.0 lb

## 2017-06-04 DIAGNOSIS — J40 Bronchitis, not specified as acute or chronic: Secondary | ICD-10-CM

## 2017-06-04 MED ORDER — PROMETHAZINE-DM 6.25-15 MG/5ML PO SYRP: 5.0000 mL | ORAL_SOLUTION | Freq: Four times a day (QID) | ORAL | 0 refills | Status: DC | PRN

## 2017-06-04 MED ORDER — AZITHROMYCIN 250 MG PO TABS: ORAL_TABLET | ORAL | 0 refills | Status: DC

## 2017-06-04 NOTE — Patient Instructions (Signed)

## 2017-06-04 NOTE — Progress Notes (Signed)
Pre visit review using our clinic review tool, if applicable. No additional management support is needed unless otherwise documented below in the visit note. 

## 2017-06-04 NOTE — Progress Notes (Signed)
Patient ID: Crystal [name], female    DOB: 23-Jan-1946  Age: 71 y.o. MRN: [id number]    Subjective:  Subjective  HPI0 Crystal [name] presents for cough and and congestionx 1 week .    She has had to use her albuterol too often   + fevers--- 103  + bodyaches   Review of Systems  Constitutional: Negative for chills and fever.  HENT: Positive for congestion, postnasal drip, rhinorrhea and sinus pressure.   Respiratory: Positive for cough, chest tightness, shortness of breath and wheezing.   Cardiovascular: Negative for chest pain, palpitations and leg swelling.  Allergic/Immunologic: Negative for environmental allergies.    History Past Medical History:  Diagnosis Date  . Adenomatous colon polyp 2007 & 2010    Dr [name]  . Allergy    seasonal  . Arthritis   . Asthma   . Cataract   . Diverticulosis   . Hyperlipidemia   . Hypertension   . IBS (irritable bowel syndrome)   . MVP (mitral valve prolapse)   . Peripheral vascular disease ([place])   . RAD (reactive airway disease)     She has a past surgical history that includes Abdominal hysterectomy; Appendectomy; Lumbar laminectomy (2000); Tonsillectomy; Shoulder surgery; Colonoscopy w/ polypectomy; Cataract extraction w/ intraocular lens implant (2013); and Colonoscopy (2013).   Her family history includes COPD in her sister; Diabetes in her sister; Heart attack in her father; Heart attack (age of onset: 31) in her brother; Other in her son; Parkinsonism in her mother; Stroke in her mother.She reports that she quit smoking about 27 years ago. She has a 23.00 pack-year smoking history. she has never used smokeless tobacco. She reports that she drinks about 8.4 oz of alcohol per week. She reports that she does not use drugs.  Current Outpatient Medications on File Prior to Visit  Medication Sig Dispense Refill  . ALPRAZolam (XANAX) 0.25 MG tablet Take 0.25 mg by mouth at bedtime as needed for anxiety.    . Butenafine HCl 1 % cream Apply  topically twice daily for seven days 15 g 1  . Calcium Carbonate-Vitamin D (CALCIUM 600/VITAMIN D PO) Take by mouth daily.      . CELEBREX 200 MG capsule Take 1 capsule (200 mg total) by mouth daily.    . Cholecalciferol (VITAMIN D3) 1000 UNITS CAPS Take by mouth. 3 by mouth once daily     . diclofenac sodium (VOLTAREN) 1 % GEL Apply topically at bedtime.    . furosemide (LASIX) 40 MG tablet TAKE ONE TABLET BY MOUTH DAILY 90 tablet 0  . losartan (COZAAR) 50 MG tablet TAKE 0.5 TABLETS (25 MG TOTAL) BY MOUTH DAILY. 45 tablet 1  . montelukast (SINGULAIR) 10 MG tablet TAKE ONE TABLET BY MOUTH DAILY 90 tablet 2  . pravastatin (PRAVACHOL) 40 MG tablet TAKE 1 TABLET DAILY BY MOUTH AT BEDTIME 90 tablet 2  . Probiotic Product (ALIGN) 4 MG CAPS Take by mouth daily.      . SYMBICORT 160-4.5 MCG/ACT inhaler INHALE 1 TO 2 PUFFS BY MOUTH EVERY 12 HOURS THEN GARGLE AND SPIT AFTER USE 10.2 g 11  . theophylline (UNIPHYL) 400 MG 24 hr tablet TAKE 1 TABLET (400 MG TOTAL) BY MOUTH DAILY. 90 tablet 3  . traMADol (ULTRAM) 50 MG tablet Take 1 tablet (50 mg total) by mouth every 6 (six) hours as needed. 60 tablet 0  . trimethoprim (TRIMPEX) 100 MG tablet Take 1 tablet by mouth as needed. To prevent bladder problems when  travels    . VENTOLIN HFA 108 (90 Base) MCG/ACT inhaler Inhale 1-2 puffs into the lungs every 6 (six) hours as needed for wheezing or shortness of breath. 1 Inhaler 8   No current facility-administered medications on file prior to visit.      Objective:  Objective  Physical Exam  Constitutional: She is oriented to person, place, and time. She appears well-developed and well-nourished.  HENT:  Head: Normocephalic and atraumatic.  Right Ear: External ear normal.  Left Ear: External ear normal.  + PND + errythema  Eyes: Conjunctivae and EOM are normal. Right eye exhibits no discharge. Left eye exhibits no discharge.  Neck: Normal range of motion. Neck supple. No JVD present. Carotid bruit is not  present. No thyromegaly present.  Cardiovascular: Normal rate, regular rhythm and normal heart sounds.  No murmur heard. Pulmonary/Chest: Effort normal. No respiratory distress. She has decreased breath sounds. She has wheezes. She has no rales. She exhibits no tenderness.  Musculoskeletal: She exhibits no edema.  Lymphadenopathy:    She has cervical adenopathy.  Neurological: She is alert and oriented to person, place, and time.  Psychiatric: She has a normal mood and affect.  Nursing note and vitals reviewed.  BP 128/68 (BP Location: Left Arm, Patient Position: Sitting, Cuff Size: Normal)   Pulse 84   Temp 98.3 F (36.8 C) (Oral)   Resp 16   Ht 5\' 3"  (1.6 m)   Wt 125 lb (56.7 kg)   SpO2 98%   BMI 22.14 kg/m  Wt Readings from Last 3 Encounters:  06/04/17 125 lb (56.7 kg)  01/05/17 130 lb (59 kg)  12/23/16 130 lb 12.8 oz (59.3 kg)     Lab Results  Component Value Date   WBC 11.1 (H) 06/25/2016   HGB 13.3 06/25/2016   HCT 39.1 06/25/2016   PLT 395.0 06/25/2016   GLUCOSE 95 06/25/2016   CHOL 218 (H) 06/25/2016   TRIG 65.0 06/25/2016   HDL 116.20 06/25/2016   LDLDIRECT 116.6 06/13/2013   LDLCALC 88 06/25/2016   ALT 11 06/25/2016   AST 24 06/25/2016   NA 139 06/25/2016   K 4.7 06/25/2016   CL 98 06/25/2016   CREATININE 0.72 06/25/2016   BUN 18 06/25/2016   CO2 31 06/25/2016   TSH 2.42 06/25/2016    Dg Epidurography  Result Date: 10/21/2011 *RADIOLOGY REPORT* Clinical Data:  Lumbosacral spondylosis without myelopathy. Significant relief after   previous injections, without side effect or complication.  Partial recurrence of symptoms.  The patient wishes to repeat. Procedure: The procedure, risks, benefits, and alternatives were explained to the patient. Questions regarding the procedure were encouraged and answered. The patient understands and consents to the procedure. LUMBAR EPIDURAL INJECTION: An interlaminar approach was performed on the right at L4-5.  Operator  donned sterile gloves and mask. The overlying skin was cleansed and anesthetized.  A 20 gauge Crawford epidural needle was advanced using loss-of-resistance technique. DIAGNOSTIC EPIDURAL INJECTION: Injection of Omnipaque 180 shows a good epidural pattern with spread above and below the level of needle placement. No intrathecal or vascular opacification is seen. THERAPEUTIC EPIDURAL INJECTION: 120mg  of Depo-Medrol mixed with 93ml lidocaine 1% were instilled.  The procedure was well-tolerated, and the patient was discharged thirty minutes following the injection in good condition. Fluoroscopy Time: 16 seconds Complications: none IMPRESSION: Technically successful epidural injection on the right at L4-5. Original Report Authenticated By: Trecia Rogers, M.D.    Assessment & Plan:  Plan  I am  having [name] start on azithromycin and promethazine-dextromethorphan. I am also having her maintain her ALIGN, Calcium Carbonate-Vitamin D (CALCIUM 600/VITAMIN D PO), Vitamin D3, trimethoprim, traMADol, diclofenac sodium, Butenafine HCl, ALPRAZolam, CELEBREX, theophylline, SYMBICORT, pravastatin, montelukast, furosemide, losartan, and VENTOLIN HFA.  Meds ordered this encounter  Medications  . azithromycin (ZITHROMAX Z-PAK) 250 MG tablet    Sig: As directed    Dispense:  6 each    Refill:  0  . promethazine-dextromethorphan (PROMETHAZINE-DM) 6.25-15 MG/5ML syrup    Sig: Take 5 mLs by mouth 4 (four) times daily as needed.    Dispense:  118 mL    Refill:  0    Problem List Items Addressed This Visit    None    Visit Diagnoses    Bronchitis    -  Primary   Relevant Medications   azithromycin (ZITHROMAX Z-PAK) 250 MG tablet   promethazine-dextromethorphan (PROMETHAZINE-DM) 6.25-15 MG/5ML syrup      Follow-up: Return if symptoms worsen or fail to improve.  Ann Held, DO

## 2017-06-25 NOTE — Progress Notes (Signed)
Subjective:    Patient ID: Crystal [name], female    DOB: [birth year], 72 y.o.   MRN: [id number]  HPI Here for medicare wellness exam and an annual physical exam.   I have personally reviewed and have noted 1.The patient's medical and social history 2.Their use of alcohol, tobacco or illicit drugs 3.Their current medications and supplements 4.The patient's functional ability including ADL's, fall risks, home                 safety risk and hearing or visual impairment. 5.Diet and physical activities 6.Evidence for depression or mood disorders 7.Care team reviewed  -  Gyn - Dr Williams Che, [place] - Dr Alfonso Ramus    Are there smokers in your home (other than you)? No  Risk Factors Exercise:   Walking or pool exercises Dietary issues discussed:  Well balanced  Vitamin and supplement use:  Reviewed, taking calcium and vitamin d, probitoics  Opiod use:  none Side effects from medication: Does medications benefits outweigh risks/side effects:   Cardiac risk factors: advanced age, hypertension, hyperlipidemia  Depression Screen  Have you felt down, depressed or hopeless? No  Have you felt little interest or pleasure in doing things?  No  Activities of Daily Living In your present state of health, do you have any difficulty performing the following activities?:  Driving? No Managing money?  No Feeding yourself? No Getting from bed to chair? No Climbing a flight of stairs? No Preparing food and eating?: No Bathing or showering? No Getting dressed: No Getting to/using the toilet? No Moving around from place to place: No In the past year have you fallen or had a near fall?: No   Are you sexually active?  No  Do you have more than one partner?  N/A  Hearing Difficulties: No Do you often ask people to speak up or repeat themselves? No Do you experience ringing or noises in your ears? No Do you have difficulty understanding  soft or whispered voices? No Vision:              Any change in vision:   No              Up to date with eye exam:   yes  Memory:  Do you feel that you have a problem with memory? No  Do you often misplace items? No  Do you feel safe at home?  Yes  Cognitive Testing  Alert, Orientated? Yes  Normal Appearance? Yes  Recall of three objects?  Yes  Can perform simple calculations? Yes  Displays appropriate judgment? Yes  Can read the correct time from a watch face? Yes   Advanced Directives have been discussed with the patient? Yes     Medications and allergies reviewed with patient and updated if appropriate.  Patient Active Problem List   Diagnosis Date Noted  . Parotitis 10/06/2016  . Osteoarthritis 06/25/2016  . Anxiety 06/25/2016  . Back pain 07/18/2015  . 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 Osteopenia 06/03/2008  . History of colonic polyps 06/03/2008  . INTENTION TREMOR 12/06/2007  . IBS 10/16/2007  . Hyperlipidemia 06/06/2007  . Essential hypertension 06/06/2007  . Chronic obstructive airway disease with asthma ([place]) 02/23/2007    Current Outpatient Medications on File Prior to Visit  Medication Sig Dispense Refill  . ALPRAZolam (XANAX) 0.25 MG tablet Take 0.25 mg by mouth at bedtime as needed for anxiety.    . Butenafine HCl 1 %  cream Apply topically twice daily for seven days 15 g 1  . Calcium Carbonate-Vitamin D (CALCIUM 600/VITAMIN D PO) Take by mouth daily.      . CELEBREX 200 MG capsule Take 1 capsule (200 mg total) by mouth daily.    . Cholecalciferol (VITAMIN D3) 1000 UNITS CAPS Take by mouth. 3 by mouth once daily     . diclofenac sodium (VOLTAREN) 1 % GEL Apply topically at bedtime.    . Estradiol 10 MCG TABS vaginal tablet estradiol 10 mcg vaginal tablet  Insert 1 tablet twice a week by vaginal route.    . furosemide (LASIX) 40 MG tablet TAKE ONE TABLET BY MOUTH DAILY 90 tablet 0  . losartan (COZAAR) 50 MG tablet TAKE  0.5 TABLETS (25 MG TOTAL) BY MOUTH DAILY. 45 tablet 1  . montelukast (SINGULAIR) 10 MG tablet TAKE ONE TABLET BY MOUTH DAILY 90 tablet 2  . pravastatin (PRAVACHOL) 40 MG tablet TAKE 1 TABLET DAILY BY MOUTH AT BEDTIME 90 tablet 2  . Probiotic Product (ALIGN) 4 MG CAPS Take by mouth daily.      . SYMBICORT 160-4.5 MCG/ACT inhaler INHALE 1 TO 2 PUFFS BY MOUTH EVERY 12 HOURS THEN GARGLE AND SPIT AFTER USE 10.2 g 11  . theophylline (UNIPHYL) 400 MG 24 hr tablet TAKE 1 TABLET (400 MG TOTAL) BY MOUTH DAILY. 90 tablet 3  . trimethoprim (TRIMPEX) 100 MG tablet Take 1 tablet by mouth as needed. To prevent bladder problems when travels    . VENTOLIN HFA 108 (90 Base) MCG/ACT inhaler Inhale 1-2 puffs into the lungs every 6 (six) hours as needed for wheezing or shortness of breath. 1 Inhaler 8   No current facility-administered medications on file prior to visit.     Past Medical History:  Diagnosis Date  . Adenomatous colon polyp 2007 & 2010    Dr [name]  . Allergy    seasonal  . Arthritis   . Asthma   . Cataract   . Diverticulosis   . Hyperlipidemia   . Hypertension   . IBS (irritable bowel syndrome)   . MVP (mitral valve prolapse)   . Peripheral vascular disease ([place])   . RAD (reactive airway disease)     Past Surgical History:  Procedure Laterality Date  . ABDOMINAL HYSTERECTOMY     with USO for dysfunctionall menses  . APPENDECTOMY    . CATARACT EXTRACTION W/ INTRAOCULAR LENS IMPLANT  2013   bilateral; Dr [name]  . COLONOSCOPY  2013   negative , due 2018; Dr [name]  . COLONOSCOPY W/ POLYPECTOMY      X2 ; diverticulosis  . LUMBAR LAMINECTOMY  2000   Dr Vertell Limber  . SHOULDER SURGERY     R shoulder  . TONSILLECTOMY      Social History   Socioeconomic History  . Marital status: Married    Spouse name: Not on file  . Number of children: Not on file  . Years of education: Not on file  . Highest education level: Not on file  Social Needs  . Financial resource strain: Not on file    . Food insecurity - worry: Not on file  . Food insecurity - inability: Not on file  . Transportation needs - medical: Not on file  . Transportation needs - non-medical: Not on file  Occupational History  . Not on file  Tobacco Use  . Smoking status: Former Smoker    Packs/day: 1.00    Years: 23.00    Pack  years: 23.00    Last attempt to quit: 06/14/1989    Years since quitting: 28.0  . Smokeless tobacco: Never Used  . Tobacco comment: smoked 1961-1991 , up to < 1 ppd  Substance and Sexual Activity  . Alcohol use: Yes    Alcohol/week: 8.4 oz    Types: 14 Glasses of wine per week    Comment: Wine  . Drug use: No  . Sexual activity: Not on file  Other Topics Concern  . Not on file  Social History Narrative  . Not on file    Family History  Problem Relation Age of Onset  . Heart attack Brother 11  . Parkinsonism Mother   . Stroke Mother   . Heart attack Father        pre 55  . Diabetes Sister   . COPD Sister        X 3  . Other Son        suicide 2016  . Colon cancer Neg Hx   . Cancer Neg Hx   . Esophageal cancer Neg Hx   . Liver cancer Neg Hx   . Pancreatic cancer Neg Hx   . Rectal cancer Neg Hx   . Stomach cancer Neg Hx     Review of Systems  Constitutional: Negative for chills, fatigue and fever.  HENT: Negative for hearing loss and tinnitus.        TMJ pain  Eyes: Negative for visual disturbance.  Respiratory: Positive for cough (mild from Dec URI). Negative for shortness of breath and wheezing.   Cardiovascular: Positive for leg swelling. Negative for chest pain and palpitations.  Gastrointestinal: Negative for abdominal pain, blood in stool, constipation and diarrhea.       GERD occ  Genitourinary: Negative for dysuria and hematuria.  Musculoskeletal: Positive for back pain (OA) and neck pain (OA).  Skin: Negative for color change and rash.  Neurological: Positive for light-headedness and headaches (TMJ). Negative for dizziness.  Psychiatric/Behavioral:  Negative for dysphoric mood. The patient is nervous/anxious (controlled).        Objective:   Vitals:   06/27/17 0902  BP: (!) 162/88  Pulse: 86  Resp: 16  Temp: 97.6 F (36.4 C)  SpO2: 92%   Filed Weights   06/27/17 0902  Weight: 122 lb (55.3 kg)   Body mass index is 21.61 kg/m.  Wt Readings from Last 3 Encounters:  06/27/17 122 lb (55.3 kg)  06/04/17 125 lb (56.7 kg)  01/05/17 130 lb (59 kg)     Physical Exam Constitutional: She appears well-developed and well-nourished. No distress.  HENT:  Head: Normocephalic and atraumatic.  Right Ear: External ear normal. Normal ear canal and TM Left Ear: External ear normal.  Normal ear canal and TM Mouth/Throat: Oropharynx is clear and moist.  Eyes: Conjunctivae and EOM are normal.  Neck: Neck supple. No tracheal deviation present. No thyromegaly present.  No carotid bruit  Cardiovascular: Normal rate, regular rhythm and normal heart sounds.   No murmur heard.  No edema. Pulmonary/Chest: Effort normal and breath sounds normal. No respiratory distress. She has no wheezes. She has no rales.  Breast: deferred to Gyn Abdominal: Soft. She exhibits no distension. There is no tenderness.  Lymphadenopathy: She has no cervical adenopathy.  Skin: Skin is warm and dry. She is not diaphoretic.  Psychiatric: She has a normal mood and affect. Her behavior is normal.        Assessment & Plan:   Wellness Exam: Immunizations  Flu vaccine up to date, shingrix done, others up to date Colonoscopy   Up to date  Mammogram   Up to date  Gyn  Up to date  Dexa   Due next month - will order next visit Eye Exam    Up to date  Hearing loss   none Memory concerns/difficulties  No concerns of memory problems Independent of ADLs   fully Stressed the importance of regular exercise   Patient received copy of preventative screening tests/immunizations recommended for the next 5-10 years.  Physical exam: Screening blood work   ordered Immunizations  Flu vaccine up to date, shingrix done , others up to date Colonoscopy   Up to date  Mammogram   Up to date  Gyn     Up to date  Dexa     Due next month - will order at next visit Eye exams      Up to date  Exercise  Fairly regular when feeling well - walking and pool exercises Weight  Normal BMI Skin  No concerns Substance abuse   none  See Problem List for Assessment and Plan of chronic medical problems.   FU in one year

## 2017-06-25 NOTE — Patient Instructions (Addendum)
Test(s) ordered today. Your results will be released to MyChart (or called to you) after review, usually within 72hours after test completion. If any changes need to be made, you will be notified at that same time.  All other Health Maintenance issues reviewed.   All recommended immunizations and age-appropriate screenings are up-to-date or discussed.  No immunizations administered today.   Medications reviewed and updated.  No changes recommended at this time.    Please followup in one year   Health Maintenance, Female Adopting a healthy lifestyle and getting preventive care can go a long way to promote health and wellness. Talk with your health care provider about what schedule of regular examinations is right for you. This is a good chance for you to check in with your provider about disease prevention and staying healthy. In between checkups, there are plenty of things you can do on your own. Experts have done a lot of research about which lifestyle changes and preventive measures are most likely to keep you healthy. Ask your health care provider for more information. Weight and diet Eat a healthy diet  Be sure to include plenty of vegetables, fruits, low-fat dairy products, and lean protein.  Do not eat a lot of foods high in solid fats, added sugars, or salt.  Get regular exercise. This is one of the most important things you can do for your health. ? Most adults should exercise for at least 150 minutes each week. The exercise should increase your heart rate and make you sweat (moderate-intensity exercise). ? Most adults should also do strengthening exercises at least twice a week. This is in addition to the moderate-intensity exercise.  Maintain a healthy weight  Body mass index (BMI) is a measurement that can be used to identify possible weight problems. It estimates body fat based on height and weight. Your health care provider can help determine your BMI and help you achieve  or maintain a healthy weight.  For females 20 years of age and older: ? A BMI below 18.5 is considered underweight. ? A BMI of 18.5 to 24.9 is normal. ? A BMI of 25 to 29.9 is considered overweight. ? A BMI of 30 and above is considered obese.  Watch levels of cholesterol and blood lipids  You should start having your blood tested for lipids and cholesterol at 74 years of age, then have this test every 5 years.  You may need to have your cholesterol levels checked more often if: ? Your lipid or cholesterol levels are high. ? You are older than 74 years of age. ? You are at high risk for heart disease.  Cancer screening Lung Cancer  Lung cancer screening is recommended for adults 55-80 years old who are at high risk for lung cancer because of a history of smoking.  A yearly low-dose CT scan of the lungs is recommended for people who: ? Currently smoke. ? Have quit within the past 15 years. ? Have at least a 30-pack-year history of smoking. A pack year is smoking an average of one pack of cigarettes a day for 1 year.  Yearly screening should continue until it has been 15 years since you quit.  Yearly screening should stop if you develop a health problem that would prevent you from having lung cancer treatment.  Breast Cancer  Practice breast self-awareness. This means understanding how your breasts normally appear and feel.  It also means doing regular breast self-exams. Let your health care provider know about any   changes, no matter how small.  If you are in your 20s or 30s, you should have a clinical breast exam (CBE) by a health care provider every 1-3 years as part of a regular health exam.  If you are 40 or older, have a CBE every year. Also consider having a breast X-ray (mammogram) every year.  If you have a family history of breast cancer, talk to your health care provider about genetic screening.  If you are at high risk for breast cancer, talk to your health care  provider about having an MRI and a mammogram every year.  Breast cancer gene (BRCA) assessment is recommended for women who have family members with BRCA-related cancers. BRCA-related cancers include: ? Breast. ? Ovarian. ? Tubal. ? Peritoneal cancers.  Results of the assessment will determine the need for genetic counseling and BRCA1 and BRCA2 testing.  Cervical Cancer Your health care provider may recommend that you be screened regularly for cancer of the pelvic organs (ovaries, uterus, and vagina). This screening involves a pelvic examination, including checking for microscopic changes to the surface of your cervix (Pap test). You may be encouraged to have this screening done every 3 years, beginning at age 21.  For women ages 30-65, health care providers may recommend pelvic exams and Pap testing every 3 years, or they may recommend the Pap and pelvic exam, combined with testing for human papilloma virus (HPV), every 5 years. Some types of HPV increase your risk of cervical cancer. Testing for HPV may also be done on women of any age with unclear Pap test results.  Other health care providers may not recommend any screening for nonpregnant women who are considered low risk for pelvic cancer and who do not have symptoms. Ask your health care provider if a screening pelvic exam is right for you.  If you have had past treatment for cervical cancer or a condition that could lead to cancer, you need Pap tests and screening for cancer for at least 20 years after your treatment. If Pap tests have been discontinued, your risk factors (such as having a new sexual partner) need to be reassessed to determine if screening should resume. Some women have medical problems that increase the chance of getting cervical cancer. In these cases, your health care provider may recommend more frequent screening and Pap tests.  Colorectal Cancer  This type of cancer can be detected and often prevented.  Routine  colorectal cancer screening usually begins at 74 years of age and continues through 75 years of age.  Your health care provider may recommend screening at an earlier age if you have risk factors for colon cancer.  Your health care provider may also recommend using home test kits to check for hidden blood in the stool.  A small camera at the end of a tube can be used to examine your colon directly (sigmoidoscopy or colonoscopy). This is done to check for the earliest forms of colorectal cancer.  Routine screening usually begins at age 50.  Direct examination of the colon should be repeated every 5-10 years through 75 years of age. However, you may need to be screened more often if early forms of precancerous polyps or small growths are found.  Skin Cancer  Check your skin from head to toe regularly.  Tell your health care provider about any new moles or changes in moles, especially if there is a change in a mole's shape or color.  Also tell your health care provider if   you have a mole that is larger than the size of a pencil eraser.  Always use sunscreen. Apply sunscreen liberally and repeatedly throughout the day.  Protect yourself by wearing long sleeves, pants, a wide-brimmed hat, and sunglasses whenever you are outside.  Heart disease, diabetes, and high blood pressure  High blood pressure causes heart disease and increases the risk of stroke. High blood pressure is more likely to develop in: ? People who have blood pressure in the high end of the normal range (130-139/85-89 mm Hg). ? People who are overweight or obese. ? People who are African American.  If you are 55-1 years of age, have your blood pressure checked every 3-5 years. If you are 86 years of age or older, have your blood pressure checked every year. You should have your blood pressure measured twice-once when you are at a hospital or clinic, and once when you are not at a hospital or clinic. Record the average of the  two measurements. To check your blood pressure when you are not at a hospital or clinic, you can use: ? An automated blood pressure machine at a pharmacy. ? A home blood pressure monitor.  If you are between 15 years and 81 years old, ask your health care provider if you should take aspirin to prevent strokes.  Have regular diabetes screenings. This involves taking a blood sample to check your fasting blood sugar level. ? If you are at a normal weight and have a low risk for diabetes, have this test once every three years after 74 years of age. ? If you are overweight and have a high risk for diabetes, consider being tested at a younger age or more often. Preventing infection Hepatitis B  If you have a higher risk for hepatitis B, you should be screened for this virus. You are considered at high risk for hepatitis B if: ? You were born in a country where hepatitis B is common. Ask your health care provider which countries are considered high risk. ? Your parents were born in a high-risk country, and you have not been immunized against hepatitis B (hepatitis B vaccine). ? You have HIV or AIDS. ? You use needles to inject street drugs. ? You live with someone who has hepatitis B. ? You have had sex with someone who has hepatitis B. ? You get hemodialysis treatment. ? You take certain medicines for conditions, including cancer, organ transplantation, and autoimmune conditions.  Hepatitis C  Blood testing is recommended for: ? Everyone born from [birth year] through 1967. ? Anyone with known risk factors for hepatitis C.  Sexually transmitted infections (STIs)  You should be screened for sexually transmitted infections (STIs) including gonorrhea and chlamydia if: ? You are sexually active and are younger than 74 years of age. ? You are older than 74 years of age and your health care provider tells you that you are at risk for this type of infection. ? Your sexual activity has changed since you  were last screened and you are at an increased risk for chlamydia or gonorrhea. Ask your health care provider if you are at risk.  If you do not have HIV, but are at risk, it may be recommended that you take a prescription medicine daily to prevent HIV infection. This is called pre-exposure prophylaxis (PrEP). You are considered at risk if: ? You are sexually active and do not regularly use condoms or know the HIV status of your partner(s). ? You take drugs by  injection. ? You are sexually active with a partner who has HIV.  Talk with your health care provider about whether you are at high risk of being infected with HIV. If you choose to begin PrEP, you should first be tested for HIV. You should then be tested every 3 months for as long as you are taking PrEP. Pregnancy  If you are premenopausal and you may become pregnant, ask your health care provider about preconception counseling.  If you may become pregnant, take 400 to 800 micrograms (mcg) of folic acid every day.  If you want to prevent pregnancy, talk to your health care provider about birth control (contraception). Osteoporosis and menopause  Osteoporosis is a disease in which the bones lose minerals and strength with aging. This can result in serious bone fractures. Your risk for osteoporosis can be identified using a bone density scan.  If you are 69 years of age or older, or if you are at risk for osteoporosis and fractures, ask your health care provider if you should be screened.  Ask your health care provider whether you should take a calcium or vitamin D supplement to lower your risk for osteoporosis.  Menopause may have certain physical symptoms and risks.  Hormone replacement therapy may reduce some of these symptoms and risks. Talk to your health care provider about whether hormone replacement therapy is right for you. Follow these instructions at home:  Schedule regular health, dental, and eye exams.  Stay current  with your immunizations.  Do not use any tobacco products including cigarettes, chewing tobacco, or electronic cigarettes.  If you are pregnant, do not drink alcohol.  If you are breastfeeding, limit how much and how often you drink alcohol.  Limit alcohol intake to no more than 1 drink per day for nonpregnant women. One drink equals 12 ounces of beer, 5 ounces of wine, or 1 ounces of hard liquor.  Do not use street drugs.  Do not share needles.  Ask your health care provider for help if you need support or information about quitting drugs.  Tell your health care provider if you often feel depressed.  Tell your health care provider if you have ever been abused or do not feel safe at home. This information is not intended to replace advice given to you by your health care provider. Make sure you discuss any questions you have with your health care provider. Document Released: 12/14/2010 Document Revised: 11/06/2015 Document Reviewed: 03/04/2015 Elsevier Interactive Patient Education  [employer].

## 2017-06-27 ENCOUNTER — Other Ambulatory Visit (INDEPENDENT_AMBULATORY_CARE_PROVIDER_SITE_OTHER): Payer: Medicare Other

## 2017-06-27 ENCOUNTER — Ambulatory Visit (INDEPENDENT_AMBULATORY_CARE_PROVIDER_SITE_OTHER): Payer: Medicare Other | Admitting: Internal Medicine

## 2017-06-27 ENCOUNTER — Encounter: Payer: Self-pay | Admitting: Internal Medicine

## 2017-06-27 VITALS — BP 162/88 | HR 86 | Temp 97.6°F | Resp 16 | Ht 63.0 in | Wt 122.0 lb

## 2017-06-27 DIAGNOSIS — I10 Essential (primary) hypertension: Secondary | ICD-10-CM | POA: Diagnosis not present

## 2017-06-27 DIAGNOSIS — J449 Chronic obstructive pulmonary disease, unspecified: Secondary | ICD-10-CM | POA: Diagnosis not present

## 2017-06-27 DIAGNOSIS — F419 Anxiety disorder, unspecified: Secondary | ICD-10-CM

## 2017-06-27 DIAGNOSIS — M8580 Other specified disorders of bone density and structure, unspecified site: Secondary | ICD-10-CM | POA: Diagnosis not present

## 2017-06-27 DIAGNOSIS — M26623 Arthralgia of bilateral temporomandibular joint: Secondary | ICD-10-CM | POA: Diagnosis not present

## 2017-06-27 DIAGNOSIS — Z0000 Encounter for general adult medical examination without abnormal findings: Secondary | ICD-10-CM

## 2017-06-27 DIAGNOSIS — E7849 Other hyperlipidemia: Secondary | ICD-10-CM

## 2017-06-27 DIAGNOSIS — M26629 Arthralgia of temporomandibular joint, unspecified side: Secondary | ICD-10-CM | POA: Insufficient documentation

## 2017-06-27 LAB — CBC WITH DIFFERENTIAL/PLATELET
Basophils Absolute: 0.1 10*3/uL (ref 0.0–0.1)
Basophils Relative: 0.6 % (ref 0.0–3.0)
Eosinophils Absolute: 0.1 10*3/uL (ref 0.0–0.7)
Eosinophils Relative: 0.7 % (ref 0.0–5.0)
HCT: 38.6 % (ref 36.0–46.0)
Hemoglobin: 12.7 g/dL (ref 12.0–15.0)
Lymphocytes Relative: 8.7 % — ABNORMAL LOW (ref 12.0–46.0)
Lymphs Abs: 1.5 10*3/uL (ref 0.7–4.0)
MCHC: 33 g/dL (ref 30.0–36.0)
MCV: 97.9 fl (ref 78.0–100.0)
Monocytes Absolute: 1.5 10*3/uL — ABNORMAL HIGH (ref 0.1–1.0)
Monocytes Relative: 9 % (ref 3.0–12.0)
Neutro Abs: 13.9 10*3/uL — ABNORMAL HIGH (ref 1.4–7.7)
Neutrophils Relative %: 81 % — ABNORMAL HIGH (ref 43.0–77.0)
Platelets: 678 10*3/uL — ABNORMAL HIGH (ref 150.0–400.0)
RBC: 3.94 Mil/uL (ref 3.87–5.11)
RDW: 14.4 % (ref 11.5–15.5)
WBC: 17.2 10*3/uL — ABNORMAL HIGH (ref 4.0–10.5)

## 2017-06-27 LAB — COMPREHENSIVE METABOLIC PANEL
ALT: 5 U/L (ref 0–35)
AST: 9 U/L (ref 0–37)
Albumin: 3.8 g/dL (ref 3.5–5.2)
Alkaline Phosphatase: 120 U/L — ABNORMAL HIGH (ref 39–117)
BUN: 10 mg/dL (ref 6–23)
CO2: 30 mEq/L (ref 19–32)
Calcium: 9.5 mg/dL (ref 8.4–10.5)
Chloride: 92 mEq/L — ABNORMAL LOW (ref 96–112)
Creatinine, Ser: 0.53 mg/dL (ref 0.40–1.20)
GFR: 120.06 mL/min (ref >60.00)
Glucose, Bld: 95 mg/dL (ref 70–99)
Potassium: 3.4 mEq/L — ABNORMAL LOW (ref 3.5–5.1)
Sodium: 137 mEq/L (ref 135–145)
Total Bilirubin: 0.5 mg/dL (ref 0.2–1.2)
Total Protein: 7.7 g/dL (ref 6.0–8.3)

## 2017-06-27 LAB — LIPID PANEL
Cholesterol: 162 mg/dL (ref 0–200)
HDL: 63.8 mg/dL (ref >39.00)
LDL Cholesterol: 79 mg/dL (ref 0–99)
NonHDL: 98.16
Total CHOL/HDL Ratio: 3
Triglycerides: 95 mg/dL (ref 0.0–149.0)
VLDL: 19 mg/dL (ref 0.0–40.0)

## 2017-06-27 LAB — TSH: TSH: 2.68 u[IU]/mL (ref 0.35–4.50)

## 2017-06-27 NOTE — Assessment & Plan Note (Signed)
dexa up to date date, but due soon - will order at next visit Trying to exercise regularly Taking calcium and vitamin d daily

## 2017-06-27 NOTE — Assessment & Plan Note (Signed)
BP high today, but typically better controlled Currently in a lot of pain from TMJ Monitor - no change in medications Cmp, cbc, tsh

## 2017-06-27 NOTE — Assessment & Plan Note (Signed)
Overall controlled Using symbicort, theophylline Albuterol prn

## 2017-06-27 NOTE — Assessment & Plan Note (Signed)
Check lipid panel  Continue daily statin Regular exercise and healthy diet encouraged  

## 2017-06-27 NOTE — Assessment & Plan Note (Signed)
Takes xanax as needed for sleep - rarely takes it

## 2017-06-27 NOTE — Assessment & Plan Note (Signed)
Severe pain - b/l Getting a mouth guard tomorrow Taking 1/2 flexeril as needed - but make her drowsy in the am --  Discussed we can try a weaker muscle relaxer if she wants Rarely takes celebrex Taking tylenol Advised her to try diclofenac gel

## 2017-06-30 ENCOUNTER — Other Ambulatory Visit: Payer: Self-pay | Admitting: Emergency Medicine

## 2017-06-30 DIAGNOSIS — D72828 Other elevated white blood cell count: Secondary | ICD-10-CM

## 2017-07-02 ENCOUNTER — Other Ambulatory Visit: Payer: Self-pay | Admitting: Internal Medicine

## 2017-07-11 ENCOUNTER — Other Ambulatory Visit (INDEPENDENT_AMBULATORY_CARE_PROVIDER_SITE_OTHER): Payer: Medicare Other

## 2017-07-11 ENCOUNTER — Other Ambulatory Visit: Payer: Self-pay | Admitting: Internal Medicine

## 2017-07-11 DIAGNOSIS — D72828 Other elevated white blood cell count: Secondary | ICD-10-CM | POA: Diagnosis not present

## 2017-07-11 LAB — CBC WITH DIFFERENTIAL/PLATELET
Basophils Absolute: 0.1 10*3/uL (ref 0.0–0.1)
Basophils Relative: 0.9 % (ref 0.0–3.0)
Eosinophils Absolute: 0.1 10*3/uL (ref 0.0–0.7)
Eosinophils Relative: 0.7 % (ref 0.0–5.0)
HCT: 37 % (ref 36.0–46.0)
Hemoglobin: 12.4 g/dL (ref 12.0–15.0)
Lymphocytes Relative: 12.7 % (ref 12.0–46.0)
Lymphs Abs: 1.4 10*3/uL (ref 0.7–4.0)
MCHC: 33.4 g/dL (ref 30.0–36.0)
MCV: 97.6 fl (ref 78.0–100.0)
Monocytes Absolute: 1.4 10*3/uL — ABNORMAL HIGH (ref 0.1–1.0)
Monocytes Relative: 11.9 % (ref 3.0–12.0)
Neutro Abs: 8.4 10*3/uL — ABNORMAL HIGH (ref 1.4–7.7)
Neutrophils Relative %: 73.8 % (ref 43.0–77.0)
Platelets: 677 10*3/uL — ABNORMAL HIGH (ref 150.0–400.0)
RBC: 3.79 Mil/uL — ABNORMAL LOW (ref 3.87–5.11)
RDW: 15.3 % (ref 11.5–15.5)
WBC: 11.3 10*3/uL — ABNORMAL HIGH (ref 4.0–10.5)

## 2017-07-15 ENCOUNTER — Ambulatory Visit: Payer: Self-pay

## 2017-07-15 NOTE — Telephone Encounter (Signed)
Pt calling with c/o lightheadedness and fatigue. Pt was seen in office 06/27/17 with similar complaints. Pt states the lightheadedness eases away but the fatigue remains, She is having night sweats. HR 96 BP  136/78. Care advice given and appt made for Monday 07/18/17 at [place] with Dr [name].   Reason for Disposition . [1] MILD dizziness (e.g., walking normally) AND [2] has been evaluated by physician for this  Answer Assessment - Initial Assessment Questions 1. DESCRIPTION: "Describe your dizziness."     Lightheadedness and fatigue 2. LIGHTHEADED: "Do you feel lightheaded?" (e.g., somewhat faint, woozy, weak upon standing)     yes 3. VERTIGO: "Do you feel like either you or the room is spinning or tilting?" (i.e. vertigo)     no 4. SEVERITY: "How bad is it?"  "Do you feel like you are going to faint?" "Can you stand and walk?"   - MILD - walking normally   - MODERATE - interferes with normal activities (e.g., work, school)    - SEVERE - unable to stand, requires support to walk, feels like passing out now.      mild 5. ONSET:  "When did the dizziness begin?"     06/27/17 6. AGGRAVATING FACTORS: "Does anything make it worse?" (e.g., standing, change in head position)     "trying to do too many" 7. HEART RATE: "Can you tell me your heart rate?" "How many beats in 15 seconds?"  (Note: not all patients can do this)       98/minute BP 136/78 8. CAUSE: "What do you think is causing the dizziness?"     Pt is not sure 9. RECURRENT SYMPTOM: "Have you had dizziness before?" If so, ask: "When was the last time?" "What happened that time?"     Just with inner ear issues and anemia when she was younger 79. OTHER SYMPTOMS: "Do you have any other symptoms?" (e.g., fever, chest pain, vomiting, diarrhea, bleeding)       No- night sweats 11. PREGNANCY: "Is there any chance you are pregnant?" "When was your last menstrual period?"       n/a  Protocols used: DIZZINESS [facility]

## 2017-07-16 NOTE — Progress Notes (Deleted)
Subjective:    Patient ID: Crystal [name], female    DOB: [birth year], 72 y.o.   MRN: [id number]  HPI The patient is here for an acute visit.  Dizziness:     Medications and allergies reviewed with patient and updated if appropriate.  Patient Active Problem List   Diagnosis Date Noted  . 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 Parotitis 10/06/2016  . Osteoarthritis 06/25/2016  . Anxiety 06/25/2016  . Back pain 07/18/2015  . 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 Osteopenia 06/03/2008  . History of colonic polyps 06/03/2008  . INTENTION TREMOR 12/06/2007  . IBS 10/16/2007  . Hyperlipidemia 06/06/2007  . Essential hypertension 06/06/2007  . Chronic obstructive airway disease with asthma ([place]) 02/23/2007    Current Outpatient Medications on File Prior to Visit  Medication Sig Dispense Refill  . ALPRAZolam (XANAX) 0.25 MG tablet Take 0.25 mg by mouth at bedtime as needed for anxiety.    . Butenafine HCl 1 % cream Apply topically twice daily for seven days 15 g 1  . Calcium Carbonate-Vitamin D (CALCIUM 600/VITAMIN D PO) Take by mouth daily.      . CELEBREX 200 MG capsule Take 1 capsule (200 mg total) by mouth daily.    . Cholecalciferol (VITAMIN D3) 1000 UNITS CAPS Take by mouth. 3 by mouth once daily     . diclofenac sodium (VOLTAREN) 1 % GEL Apply topically at bedtime.    . Estradiol 10 MCG TABS vaginal tablet estradiol 10 mcg vaginal tablet  Insert 1 tablet twice a week by vaginal route.    . furosemide (LASIX) 40 MG tablet TAKE ONE TABLET BY MOUTH DAILY 90 tablet 0  . losartan (COZAAR) 50 MG tablet TAKE 0.5 TABLETS (25 MG TOTAL) BY MOUTH DAILY. 45 tablet 1  . montelukast (SINGULAIR) 10 MG tablet TAKE ONE TABLET BY MOUTH DAILY 90 tablet 1  . pravastatin (PRAVACHOL) 40 MG tablet TAKE 1 TABLET DAILY BY MOUTH AT BEDTIME 90 tablet 1  . Probiotic Product (ALIGN) 4 MG CAPS Take by mouth daily.      . SYMBICORT 160-4.5 MCG/ACT inhaler INHALE 1 TO 2 PUFFS BY MOUTH  EVERY 12 HOURS THEN GARGLE AND SPIT AFTER USE 10.2 g 11  . theophylline (UNIPHYL) 400 MG 24 hr tablet TAKE 1 TABLET (400 MG TOTAL) BY MOUTH DAILY. 90 tablet 3  . trimethoprim (TRIMPEX) 100 MG tablet Take 1 tablet by mouth as needed. To prevent bladder problems when travels    . VENTOLIN HFA 108 (90 Base) MCG/ACT inhaler Inhale 1-2 puffs into the lungs every 6 (six) hours as needed for wheezing or shortness of breath. 1 Inhaler 8   No current facility-administered medications on file prior to visit.     Past Medical History:  Diagnosis Date  . Adenomatous colon polyp 2007 & 2010    Dr [name]  . Allergy    seasonal  . Arthritis   . Asthma   . Cataract   . Diverticulosis   . Hyperlipidemia   . Hypertension   . IBS (irritable bowel syndrome)   . MVP (mitral valve prolapse)   . Peripheral vascular disease ([place])   . RAD (reactive airway disease)     Past Surgical History:  Procedure Laterality Date  . ABDOMINAL HYSTERECTOMY     with USO for dysfunctionall menses  . APPENDECTOMY    . CATARACT EXTRACTION W/ INTRAOCULAR LENS IMPLANT  2013   bilateral; Dr [name]  . COLONOSCOPY  2013  negative , due 2018; Dr [name]  . COLONOSCOPY W/ POLYPECTOMY      X2 ; diverticulosis  . LUMBAR LAMINECTOMY  2000   Dr Vertell Limber  . SHOULDER SURGERY     R shoulder  . TONSILLECTOMY      Social History   Socioeconomic History  . Marital status: Married    Spouse name: Not on file  . Number of children: Not on file  . Years of education: Not on file  . Highest education level: Not on file  Social Needs  . Financial resource strain: Not on file  . Food insecurity - worry: Not on file  . Food insecurity - inability: Not on file  . Transportation needs - medical: Not on file  . Transportation needs - non-medical: Not on file  Occupational History  . Not on file  Tobacco Use  . Smoking status: Former Smoker    Packs/day: 1.00    Years: 23.00    Pack years: 23.00    Last attempt to quit:  06/14/1989    Years since quitting: 28.1  . Smokeless tobacco: Never Used  . Tobacco comment: smoked 1961-1991 , up to < 1 ppd  Substance and Sexual Activity  . Alcohol use: Yes    Alcohol/week: 8.4 oz    Types: 14 Glasses of wine per week    Comment: Wine  . Drug use: No  . Sexual activity: Not on file  Other Topics Concern  . Not on file  Social History Narrative  . Not on file    Family History  Problem Relation Age of Onset  . Heart attack Brother 60  . Parkinsonism Mother   . Stroke Mother   . Heart attack Father        pre 31  . Diabetes Sister   . COPD Sister        X 3  . Other Son        suicide 2016  . Colon cancer Neg Hx   . Cancer Neg Hx   . Esophageal cancer Neg Hx   . Liver cancer Neg Hx   . Pancreatic cancer Neg Hx   . Rectal cancer Neg Hx   . Stomach cancer Neg Hx     Review of Systems     Objective:  There were no vitals filed for this visit. Wt Readings from Last 3 Encounters:  06/27/17 122 lb (55.3 kg)  06/04/17 125 lb (56.7 kg)  01/05/17 130 lb (59 kg)   There is no height or weight on file to calculate BMI.   Physical Exam         Assessment & Plan:    See Problem List for Assessment and Plan of chronic medical problems.

## 2017-07-17 ENCOUNTER — Encounter (HOSPITAL_COMMUNITY): Payer: Self-pay | Admitting: Emergency Medicine

## 2017-07-17 ENCOUNTER — Other Ambulatory Visit: Payer: Self-pay

## 2017-07-17 DIAGNOSIS — R51 Headache: Secondary | ICD-10-CM

## 2017-07-17 DIAGNOSIS — Z5321 Procedure and treatment not carried out due to patient leaving prior to being seen by health care provider: Secondary | ICD-10-CM

## 2017-07-17 DIAGNOSIS — H4323 Crystalline deposits in vitreous body, bilateral: Secondary | ICD-10-CM | POA: Diagnosis not present

## 2017-07-17 DIAGNOSIS — H5712 Ocular pain, left eye: Secondary | ICD-10-CM | POA: Insufficient documentation

## 2017-07-17 NOTE — ED Triage Notes (Signed)
Patient arrived from doctors office. Complaints of possible optic nerve in left eye being separated or infection. Vision completely gone in left eye. Complaints of intermittent headache. Doctor sent for further evaluation of eye.

## 2017-07-17 NOTE — ED Notes (Signed)
Patient also complains of increase of weakness over last few days.

## 2017-07-18 ENCOUNTER — Encounter (HOSPITAL_COMMUNITY): Payer: Self-pay | Admitting: Emergency Medicine

## 2017-07-18 ENCOUNTER — Ambulatory Visit: Payer: Medicare Other | Admitting: Internal Medicine

## 2017-07-18 ENCOUNTER — Inpatient Hospital Stay (HOSPITAL_COMMUNITY)
Admission: EM | Admit: 2017-07-18 | Discharge: 2017-07-22 | DRG: 517 | Disposition: A | Discharge status: Home / Self Care | Payer: Medicare Other | Attending: Family Medicine | Admitting: Family Medicine

## 2017-07-18 ENCOUNTER — Other Ambulatory Visit: Payer: Self-pay

## 2017-07-18 ENCOUNTER — Emergency Department (HOSPITAL_COMMUNITY)
Admission: EM | Admit: 2017-07-18 | Discharge: 2017-07-18 | Disposition: A | Discharge status: Home / Self Care | Payer: Medicare Other | Source: Home / Self Care

## 2017-07-18 ENCOUNTER — Emergency Department (HOSPITAL_COMMUNITY): Payer: Medicare Other

## 2017-07-18 DIAGNOSIS — M8580 Other specified disorders of bone density and structure, unspecified site: Secondary | ICD-10-CM | POA: Diagnosis present

## 2017-07-18 DIAGNOSIS — Z961 Presence of intraocular lens: Secondary | ICD-10-CM | POA: Diagnosis present

## 2017-07-18 DIAGNOSIS — R633 Feeding difficulties: Secondary | ICD-10-CM | POA: Diagnosis not present

## 2017-07-18 DIAGNOSIS — M1990 Unspecified osteoarthritis, unspecified site: Secondary | ICD-10-CM | POA: Diagnosis present

## 2017-07-18 DIAGNOSIS — J45909 Unspecified asthma, uncomplicated: Secondary | ICD-10-CM | POA: Diagnosis present

## 2017-07-18 DIAGNOSIS — F419 Anxiety disorder, unspecified: Secondary | ICD-10-CM | POA: Diagnosis not present

## 2017-07-18 DIAGNOSIS — Z833 Family history of diabetes mellitus: Secondary | ICD-10-CM | POA: Diagnosis not present

## 2017-07-18 DIAGNOSIS — E7849 Other hyperlipidemia: Secondary | ICD-10-CM | POA: Diagnosis not present

## 2017-07-18 DIAGNOSIS — Z87891 Personal history of nicotine dependence: Secondary | ICD-10-CM

## 2017-07-18 DIAGNOSIS — I739 Peripheral vascular disease, unspecified: Secondary | ICD-10-CM | POA: Diagnosis present

## 2017-07-18 DIAGNOSIS — Z9842 Cataract extraction status, left eye: Secondary | ICD-10-CM

## 2017-07-18 DIAGNOSIS — Z86010 Personal history of colonic polyps: Secondary | ICD-10-CM

## 2017-07-18 DIAGNOSIS — Z90710 Acquired absence of both cervix and uterus: Secondary | ICD-10-CM

## 2017-07-18 DIAGNOSIS — Z7951 Long term (current) use of inhaled steroids: Secondary | ICD-10-CM

## 2017-07-18 DIAGNOSIS — I10 Essential (primary) hypertension: Secondary | ICD-10-CM | POA: Diagnosis present

## 2017-07-18 DIAGNOSIS — Z820 Family history of epilepsy and other diseases of the nervous system: Secondary | ICD-10-CM

## 2017-07-18 DIAGNOSIS — H543 Unqualified visual loss, both eyes: Secondary | ICD-10-CM | POA: Diagnosis present

## 2017-07-18 DIAGNOSIS — K589 Irritable bowel syndrome without diarrhea: Secondary | ICD-10-CM | POA: Diagnosis not present

## 2017-07-18 DIAGNOSIS — Z66 Do not resuscitate: Secondary | ICD-10-CM | POA: Diagnosis present

## 2017-07-18 DIAGNOSIS — T380X5A Adverse effect of glucocorticoids and synthetic analogues, initial encounter: Secondary | ICD-10-CM | POA: Diagnosis not present

## 2017-07-18 DIAGNOSIS — Z825 Family history of asthma and other chronic lower respiratory diseases: Secondary | ICD-10-CM

## 2017-07-18 DIAGNOSIS — I341 Nonrheumatic mitral (valve) prolapse: Secondary | ICD-10-CM | POA: Diagnosis present

## 2017-07-18 DIAGNOSIS — Z9841 Cataract extraction status, right eye: Secondary | ICD-10-CM | POA: Diagnosis not present

## 2017-07-18 DIAGNOSIS — H538 Other visual disturbances: Secondary | ICD-10-CM | POA: Diagnosis not present

## 2017-07-18 DIAGNOSIS — Z8249 Family history of ischemic heart disease and other diseases of the circulatory system: Secondary | ICD-10-CM | POA: Diagnosis not present

## 2017-07-18 DIAGNOSIS — H53133 Sudden visual loss, bilateral: Secondary | ICD-10-CM | POA: Diagnosis present

## 2017-07-18 DIAGNOSIS — M316 Other giant cell arteritis: Principal | ICD-10-CM

## 2017-07-18 DIAGNOSIS — Z79899 Other long term (current) drug therapy: Secondary | ICD-10-CM

## 2017-07-18 DIAGNOSIS — E785 Hyperlipidemia, unspecified: Secondary | ICD-10-CM | POA: Diagnosis present

## 2017-07-18 DIAGNOSIS — Z823 Family history of stroke: Secondary | ICD-10-CM | POA: Diagnosis not present

## 2017-07-18 DIAGNOSIS — R51 Headache: Secondary | ICD-10-CM | POA: Diagnosis not present

## 2017-07-18 DIAGNOSIS — I677 Cerebral arteritis, not elsewhere classified: Secondary | ICD-10-CM | POA: Diagnosis not present

## 2017-07-18 DIAGNOSIS — M26629 Arthralgia of temporomandibular joint, unspecified side: Secondary | ICD-10-CM | POA: Diagnosis not present

## 2017-07-18 DIAGNOSIS — Y92230 Patient room in hospital as the place of occurrence of the external cause: Secondary | ICD-10-CM | POA: Diagnosis not present

## 2017-07-18 DIAGNOSIS — I6789 Other cerebrovascular disease: Secondary | ICD-10-CM | POA: Diagnosis not present

## 2017-07-18 DIAGNOSIS — Z888 Allergy status to other drugs, medicaments and biological substances status: Secondary | ICD-10-CM

## 2017-07-18 DIAGNOSIS — E876 Hypokalemia: Secondary | ICD-10-CM | POA: Diagnosis not present

## 2017-07-18 LAB — CBC WITH DIFFERENTIAL/PLATELET
Basophils Absolute: 0 10*3/uL (ref 0.0–0.1)
Basophils Relative: 0 %
Eosinophils Absolute: 0.3 10*3/uL (ref 0.0–0.7)
Eosinophils Relative: 2 %
HCT: 40.3 % (ref 36.0–46.0)
Hemoglobin: 12.7 g/dL (ref 12.0–15.0)
Lymphocytes Relative: 14 %
Lymphs Abs: 1.9 10*3/uL (ref 0.7–4.0)
MCH: 32.2 pg (ref 26.0–34.0)
MCHC: 31.5 g/dL (ref 30.0–36.0)
MCV: 102.3 fL — ABNORMAL HIGH (ref 78.0–100.0)
Monocytes Absolute: 1.2 10*3/uL — ABNORMAL HIGH (ref 0.1–1.0)
Monocytes Relative: 9 %
Neutro Abs: 9.8 10*3/uL — ABNORMAL HIGH (ref 1.7–7.7)
Neutrophils Relative %: 75 %
Platelets: 474 10*3/uL — ABNORMAL HIGH (ref 150–400)
RBC: 3.94 MIL/uL (ref 3.87–5.11)
RDW: 15.3 % (ref 11.5–15.5)
WBC: 13.1 10*3/uL — ABNORMAL HIGH (ref 4.0–10.5)

## 2017-07-18 LAB — BASIC METABOLIC PANEL
Anion gap: 15 (ref 5–15)
BUN: 8 mg/dL (ref 6–20)
CO2: 23 mmol/L (ref 22–32)
Calcium: 9.4 mg/dL (ref 8.9–10.3)
Chloride: 101 mmol/L (ref 101–111)
Creatinine, Ser: 0.6 mg/dL (ref 0.44–1.00)
GFR calc Af Amer: >60 mL/min (ref >60)
GFR calc non Af Amer: >60 mL/min (ref >60)
Glucose, Bld: 86 mg/dL (ref 65–99)
Potassium: 3.8 mmol/L (ref 3.5–5.1)
Sodium: 139 mmol/L (ref 135–145)

## 2017-07-18 LAB — C-REACTIVE PROTEIN: CRP: 7.1 mg/dL — ABNORMAL HIGH (ref <1.0)

## 2017-07-18 LAB — SEDIMENTATION RATE: Sed Rate: 55 mm/hr — ABNORMAL HIGH (ref 0–22)

## 2017-07-18 MED ORDER — ONDANSETRON HCL 4 MG/2ML IJ SOLN: 4.0000 mg | Freq: Four times a day (QID) | INTRAMUSCULAR | Status: DC | PRN

## 2017-07-18 MED ORDER — MOMETASONE FURO-FORMOTEROL FUM 200-5 MCG/ACT IN AERO
2.0000 | INHALATION_SPRAY | Freq: Two times a day (BID) | RESPIRATORY_TRACT | Status: DC
  Administered 2017-07-19 – 2017-07-22 (×6): 2 via RESPIRATORY_TRACT
  Filled 2017-07-18: qty 8.8

## 2017-07-18 MED ORDER — MONTELUKAST SODIUM 10 MG PO TABS
10.0000 mg | ORAL_TABLET | Freq: Every evening | ORAL | Status: DC
  Administered 2017-07-18 – 2017-07-21 (×4): 10 mg via ORAL
  Filled 2017-07-18 (×4): qty 1

## 2017-07-18 MED ORDER — SODIUM CHLORIDE 0.9 % IV SOLN
1000.0000 mg | Freq: Every day | INTRAVENOUS | Status: AC
  Administered 2017-07-19 – 2017-07-20 (×2): 1000 mg via INTRAVENOUS
  Filled 2017-07-18 (×2): qty 8

## 2017-07-18 MED ORDER — SODIUM CHLORIDE 0.9 % IV SOLN
1000.0000 mg | Freq: Once | INTRAVENOUS | Status: AC
  Administered 2017-07-18: 1000 mg via INTRAVENOUS
  Filled 2017-07-18: qty 8

## 2017-07-18 MED ORDER — ENOXAPARIN SODIUM 40 MG/0.4ML ~~LOC~~ SOLN
40.0000 mg | SUBCUTANEOUS | Status: DC
  Administered 2017-07-18 – 2017-07-21 (×4): 40 mg via SUBCUTANEOUS
  Filled 2017-07-18 (×5): qty 0.4

## 2017-07-18 MED ORDER — ACETAMINOPHEN 650 MG RE SUPP: 650.0000 mg | Freq: Four times a day (QID) | RECTAL | Status: DC | PRN

## 2017-07-18 MED ORDER — GADOBENATE DIMEGLUMINE 529 MG/ML IV SOLN
15.0000 mL | Freq: Once | INTRAVENOUS | Status: AC
  Administered 2017-07-18: 11 mL via INTRAVENOUS

## 2017-07-18 MED ORDER — PRAVASTATIN SODIUM 40 MG PO TABS
40.0000 mg | ORAL_TABLET | Freq: Every day | ORAL | Status: DC
  Administered 2017-07-18 – 2017-07-21 (×4): 40 mg via ORAL
  Filled 2017-07-18 (×4): qty 1

## 2017-07-18 MED ORDER — LOSARTAN POTASSIUM 50 MG PO TABS
25.0000 mg | ORAL_TABLET | Freq: Every day | ORAL | Status: DC
  Administered 2017-07-19 – 2017-07-22 (×4): 25 mg via ORAL
  Filled 2017-07-18 (×5): qty 1

## 2017-07-18 MED ORDER — DOCUSATE SODIUM 100 MG PO CAPS
100.0000 mg | ORAL_CAPSULE | Freq: Two times a day (BID) | ORAL | Status: DC
  Administered 2017-07-18 – 2017-07-20 (×5): 100 mg via ORAL
  Filled 2017-07-18 (×5): qty 1

## 2017-07-18 MED ORDER — THEOPHYLLINE ER 400 MG PO TB24
200.0000 mg | ORAL_TABLET | Freq: Two times a day (BID) | ORAL | Status: DC
  Administered 2017-07-18 – 2017-07-22 (×7): 200 mg via ORAL
  Filled 2017-07-18 (×8): qty 0.5

## 2017-07-18 MED ORDER — ALPRAZOLAM 0.25 MG PO TABS
0.2500 mg | ORAL_TABLET | Freq: Every evening | ORAL | Status: DC | PRN
  Administered 2017-07-19 – 2017-07-21 (×3): 0.25 mg via ORAL
  Filled 2017-07-18 (×3): qty 1

## 2017-07-18 MED ORDER — ACETAMINOPHEN 325 MG PO TABS
650.0000 mg | ORAL_TABLET | Freq: Four times a day (QID) | ORAL | Status: DC | PRN
  Administered 2017-07-18 – 2017-07-21 (×7): 650 mg via ORAL
  Filled 2017-07-18 (×7): qty 2

## 2017-07-18 MED ORDER — METOCLOPRAMIDE HCL 5 MG/ML IJ SOLN
10.0000 mg | Freq: Once | INTRAMUSCULAR | Status: AC
  Administered 2017-07-18: 10 mg via INTRAVENOUS
  Filled 2017-07-18: qty 2

## 2017-07-18 MED ORDER — LACTATED RINGERS IV SOLN
INTRAVENOUS | Status: AC
  Administered 2017-07-18: 17:00:00 via INTRAVENOUS

## 2017-07-18 MED ORDER — MORPHINE SULFATE (PF) 2 MG/ML IV SOLN
2.0000 mg | INTRAVENOUS | Status: DC | PRN
  Administered 2017-07-21 – 2017-07-22 (×3): 2 mg via INTRAVENOUS
  Filled 2017-07-18 (×3): qty 1

## 2017-07-18 MED ORDER — ONDANSETRON HCL 4 MG PO TABS: 4.0000 mg | ORAL_TABLET | Freq: Four times a day (QID) | ORAL | Status: DC | PRN

## 2017-07-18 NOTE — ED Triage Notes (Signed)
Brought by ems for c/o blurred vision in left eye that started yesterday.  Was seen by eye doctor.  Told she had optic disc edema and would need an MRI.  Came here last night but left due to long wait.  Now reports some blurred vision to right eye.  Also endorses a headache.

## 2017-07-18 NOTE — ED Notes (Signed)
ED Provider at bedside. 

## 2017-07-18 NOTE — ED Notes (Addendum)
Patient's spouse at bedside

## 2017-07-18 NOTE — ED Notes (Signed)
Patient's spouse called to inform him that patient has a bed and will be moved to 3W02. No further questions at this time

## 2017-07-18 NOTE — ED Notes (Signed)
No answer in waiting area.

## 2017-07-18 NOTE — ED Notes (Signed)
Soft Diet Ordered for Lunch.

## 2017-07-18 NOTE — ED Notes (Addendum)
PT's spouse left. He said he will be back soon. He wants to be called if she gets a room. Please call husband if patient gets a bed and is being moved

## 2017-07-18 NOTE — H&P (Addendum)
History and Physical    Crystal [name]:[id number] DOB: 1945-10-25 DOA: 07/18/2017  PCP: [name], MD Consultants:  Oneita Kras - orthopedics; [place]; Kandee Keen - optometry Patient coming from: Home - lives with husband; NOK: Husband, [phone]  Chief Complaint: vision problems  HPI: Crystal [name] is a 72 y.o. female with medical history significant of RAD; PVD; HTN; and HLD presenting with subacute vision loss.  She was diagnosed with TMJ in December after an extensive evaluation.  She has had some headaches, "thought they were coming from the TMJ."   Headache is frontal, occipital with neck tension.   She took a half tablet of flexeril.  Saturday, vision was a bit blurry in the left eye.  By Sunday AM, the headache was worse and she had significant vision problem, basically total left-sided vision loss.    Yesterday, she met the eye doctor at his office (she thought something had happened to the implant from her cataract surgery 7-8 years ago) and the optic nerve looked like it was infected and so she was sent to the ER.  He thought this was related to her elevated WBC count.  She checked in about 2-3pm yesterday and was not going to seen in the ER until late last night so she left to see her PCP this AM after calling and discussing it with her optomestrist. This AM upon awakening she developed R eye symptoms.  She was going to see her PCP bright and early but then they called the eye doctor back and he told them to come back to the ER.  She is continuing to have R eye symptoms - total vision loss from midline down.  She was told by her eye doctor to return if there were any other problems.   Also with severe TMJ pain starting in December, had a mouthguard made in January.  She had difficulty eating due to the pain.  The mouthguard has helped some, it is a little better in the AM when she wakes up.  She is unable to eat anything other than a soft diet.   ED Course:  Concern for developing  blindness from temporal arteritis.  Started in December with jaw pain, concerning for TMJ.  Started Saturday with left vision loss.  Optometry yesterday sent to ER yesterday and has been here all night.  Left eye is now completely black with Darrall Dears pupil; right eye is without lower vision.  CRP elevated, ESR pending.  MRI negative.  Dr. 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  Given 1 gram IV solumedrol, will need for at least 3 days.  Review of Systems: As per HPI; otherwise review of systems reviewed and negative.   Ambulatory Status:  Ambulates without assistance  Past Medical History:  Diagnosis Date  . Adenomatous colon polyp 2007 & 2010    Dr [name]  . Allergy    seasonal  . Arthritis   . Cataract   . Diverticulosis   . Hyperlipidemia   . Hypertension   . IBS (irritable bowel syndrome)   . MVP (mitral valve prolapse)   . Peripheral vascular disease ([place])   . RAD (reactive airway disease)     Past Surgical History:  Procedure Laterality Date  . ABDOMINAL HYSTERECTOMY     with USO for dysfunctionall menses  . APPENDECTOMY    . CATARACT EXTRACTION W/ INTRAOCULAR LENS IMPLANT  2013   bilateral; Dr [name]  . COLONOSCOPY  2013  negative , due 2018; Dr [name]  . COLONOSCOPY W/ POLYPECTOMY      X2 ; diverticulosis  . LUMBAR LAMINECTOMY  2000   Dr Vertell Limber  . SHOULDER SURGERY     R shoulder  . TONSILLECTOMY      Social History   Socioeconomic History  . Marital status: Married    Spouse name: Not on file  . Number of children: Not on file  . Years of education: Not on file  . Highest education level: Not on file  Social Needs  . Financial resource strain: Not on file  . Food insecurity - worry: Not on file  . Food insecurity - inability: Not on file  . Transportation needs - medical: Not on file  . Transportation needs - non-medical: Not on file  Occupational History  . Occupation: retired  Tobacco Use  . Smoking status: Former  Smoker    Packs/day: 1.00    Years: 23.00    Pack years: 23.00    Last attempt to quit: 06/14/1989    Years since quitting: 28.1  . Smokeless tobacco: Never Used  . Tobacco comment: smoked 1961-1991 , up to < 1 ppd  Substance and Sexual Activity  . Alcohol use: Yes    Alcohol/week: 8.4 oz    Types: 14 Glasses of wine per week    Comment: Wine  . Drug use: No  . Sexual activity: Not on file  Other Topics Concern  . Not on file  Social History Narrative  . Not on file    Allergies  Allergen Reactions  . Simvastatin     myalgias  . Prednisone Hives    Family History  Problem Relation Age of Onset  . Heart attack Brother 40  . Parkinsonism Mother   . Stroke Mother   . Heart attack Father        pre 28  . Diabetes Sister   . COPD Sister        X 3  . Other Son        suicide 2016  . Colon cancer Neg Hx   . Cancer Neg Hx   . Esophageal cancer Neg Hx   . Liver cancer Neg Hx   . Pancreatic cancer Neg Hx   . Rectal cancer Neg Hx   . Stomach cancer Neg Hx     Prior to Admission medications   Medication Sig Start Date End Date Taking? Authorizing Provider  ALPRAZolam Duanne Moron) 0.25 MG tablet Take 0.25 mg by mouth at bedtime as needed for anxiety.   Yes [provider]  Calcium Carbonate-Vitamin D (CALCIUM 600/VITAMIN D PO) Take by mouth daily.     Yes [provider]  CELEBREX 200 MG capsule Take 1 capsule (200 mg total) by mouth daily. 06/25/16  Yes Burns, Claudina Lick, MD  Cholecalciferol (VITAMIN D3) 1000 UNITS CAPS Take 3,000 Units by mouth daily with lunch.    Yes [provider]  diclofenac sodium (VOLTAREN) 1 % GEL Apply topically at bedtime.   Yes [provider]  Estradiol 10 MCG TABS vaginal tablet estradiol 10 mcg vaginal tablet  Insert 1 tablet twice a week by vaginal route.   Yes [provider]  furosemide (LASIX) 40 MG tablet TAKE ONE TABLET BY MOUTH DAILY 05/13/17  Yes Burns, Claudina Lick, MD  losartan (COZAAR) 50 MG tablet  TAKE 0.5 TABLETS (25 MG TOTAL) BY MOUTH DAILY. 05/24/17  Yes Burns, Claudina Lick, MD  montelukast (SINGULAIR) 10 MG tablet  TAKE ONE TABLET BY MOUTH DAILY Patient taking differently: TAKE ONE TABLET BY MOUTH in the evening 07/11/17  Yes Burns, Claudina Lick, MD  pravastatin (PRAVACHOL) 40 MG tablet TAKE 1 TABLET DAILY BY MOUTH AT BEDTIME 07/11/17  Yes Burns, Claudina Lick, MD  Probiotic Product (ALIGN) 4 MG CAPS Take by mouth daily.     Yes [provider]  SYMBICORT 160-4.5 MCG/ACT inhaler INHALE 1 TO 2 PUFFS BY MOUTH EVERY 12 HOURS THEN GARGLE AND SPIT AFTER USE 07/27/16  Yes Burns, Claudina Lick, MD  theophylline (UNIPHYL) 400 MG 24 hr tablet TAKE 1 TABLET (400 MG TOTAL) BY MOUTH DAILY. Patient taking differently: Take 200 mg by mouth 2 (two) times daily.  07/04/17  Yes Burns, Claudina Lick, MD  trimethoprim (TRIMPEX) 100 MG tablet Take 1 tablet by mouth as needed. To prevent bladder problems when travels 09/15/11  Yes [provider]  VENTOLIN HFA 108 (90 Base) MCG/ACT inhaler Inhale 1-2 puffs into the lungs every 6 (six) hours as needed for wheezing or shortness of breath. 06/03/17  Yes [name], MD  Butenafine HCl 1 % cream Apply topically twice daily for seven days 12/13/14   Hendricks Limes, MD    Physical Exam: Vitals:   07/18/17 1230 07/18/17 1336 07/18/17 1507 07/18/17 1735  BP: (!) 130/54 131/60 (!) 135/55 (!) 109/46  Pulse: 80 79 85 80  Resp:  _0 Temp:  98 F (36.7 C) (!) 97.5 F (36.4 C) 97.6 F (36.4 C)  TempSrc:  Oral Oral Oral  SpO2: 91% 92% 92% 92%  Weight:      Height:         General: Appears calm and comfortable and is NAD but with her eyes mostly closed Eyes: Left Marcus Catterton pupil, right pupil is reactive to light and accommodation ENT:  grossly normal hearing, lips & tongue, mmm; appropriate dentition; difficulty opening jaw due to pain Neck:  no LAD, masses or thyromegaly; no carotid bruits Cardiovascular:  RRR, no m/r/g. No LE edema.  Respiratory:   CTA  bilaterally with no wheezes/rales/rhonchi.  Normal respiratory effort. Abdomen:  soft, NT, ND, NABS Skin:  no rash or induration seen on limited exam Musculoskeletal:  grossly normal tone BUE/BLE, good ROM, no bony abnormality Lower extremity:  No LE edema.  Limited foot exam with no ulcerations.  2+ distal pulses. Psychiatric:  grossly normal mood and affect, speech fluent and appropriate, AOx3 Neurologic:  CN 2-12 grossly intact, moves all extremities in coordinated fashion, sensation intact    Radiological Exams on Admission: Mr Jeri Cos And Wo Contrast  Result Date: 07/18/2017 CLINICAL DATA:  Visual loss or uveitis/scleritis. Three days ago there was blurry vision in the left eye. Two days ago there was complete vision loss in the left eye. Now partial vision loss in the right eye. EXAM: MRI HEAD AND ORBITS WITHOUT AND WITH CONTRAST TECHNIQUE: Multiplanar, multiecho pulse sequences of the brain and surrounding structures were obtained without and with intravenous contrast. Multiplanar, multiecho pulse sequences of the orbits and surrounding structures were obtained including fat saturation techniques, before and after intravenous contrast administration. CONTRAST:  54m MULTIHANCE GADOBENATE DIMEGLUMINE 529 MG/ML IV SOLN COMPARISON:  11/27/2007 head CT FINDINGS: MRI HEAD FINDINGS Brain: No acute infarction, hemorrhage, hydrocephalus, extra-axial collection or mass lesion. Few signal abnormalities mainly in the cerebral white matter, essentially age congruent. No specific demyelinating pattern. Vascular: Major flow voids and vascular enhancements are preserved Skull and upper cervical spine: No evidence  of marrow lesion. C3-4 facet arthropathy on the left with mild anterolisthesis. MRI ORBITS FINDINGS Orbits: Status post bilateral cataract resection. Suspect subtle mounding of the optic disc on the left. The optic nerves themselves have symmetric normal signal, but there is perineural strandy  appearance on precontrast T1 weighted imaging that enhances. There is no focal masslike area. The superior ophthalmic veins have symmetric signal and enhancement. Negative cavernous sinus region. Normal appearance of the extraocular muscles and lacrimal glands. Visualized sinuses: Clear Soft tissues: Negative IMPRESSION: Orbit MRI: Bilateral optic perineuritis. In addition to idiopathic inflammation this pattern has also been described with giant cell arteritis (reported history of headache and jaw claudication). Brain MRI: No acute finding. No evidence of elevated intracranial pressure or demyelinating disease. Electronically Signed   By: Monte Fantasia M.D.   On: 07/18/2017 08:45   Mr Rosealee Albee CN Contrast  Result Date: 07/18/2017 CLINICAL DATA:  Visual loss or uveitis/scleritis. Three days ago there was blurry vision in the left eye. Two days ago there was complete vision loss in the left eye. Now partial vision loss in the right eye. EXAM: MRI HEAD AND ORBITS WITHOUT AND WITH CONTRAST TECHNIQUE: Multiplanar, multiecho pulse sequences of the brain and surrounding structures were obtained without and with intravenous contrast. Multiplanar, multiecho pulse sequences of the orbits and surrounding structures were obtained including fat saturation techniques, before and after intravenous contrast administration. CONTRAST:  45m MULTIHANCE GADOBENATE DIMEGLUMINE 529 MG/ML IV SOLN COMPARISON:  11/27/2007 head CT FINDINGS: MRI HEAD FINDINGS Brain: No acute infarction, hemorrhage, hydrocephalus, extra-axial collection or mass lesion. Few signal abnormalities mainly in the cerebral white matter, essentially age congruent. No specific demyelinating pattern. Vascular: Major flow voids and vascular enhancements are preserved Skull and upper cervical spine: No evidence of marrow lesion. C3-4 facet arthropathy on the left with mild anterolisthesis. MRI ORBITS FINDINGS Orbits: Status post bilateral cataract resection. Suspect  subtle mounding of the optic disc on the left. The optic nerves themselves have symmetric normal signal, but there is perineural strandy appearance on precontrast T1 weighted imaging that enhances. There is no focal masslike area. The superior ophthalmic veins have symmetric signal and enhancement. Negative cavernous sinus region. Normal appearance of the extraocular muscles and lacrimal glands. Visualized sinuses: Clear Soft tissues: Negative IMPRESSION: Orbit MRI: Bilateral optic perineuritis. In addition to idiopathic inflammation this pattern has also been described with giant cell arteritis (reported history of headache and jaw claudication). Brain MRI: No acute finding. No evidence of elevated intracranial pressure or demyelinating disease. Electronically Signed   By: JMonte FantasiaM.D.   On: 07/18/2017 08:45    EKG: not done   Labs on Admission: I have personally reviewed the available labs and imaging studies at the time of the admission.  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 Temporal arteritis -Patient has been started on Solumedrol 1 gram IV daily x at least 3 days -Hopefully her TMJ symptoms as well as the vision symptoms will improve with steroid treatment -Pain  control with prn morphine -Neurology is on board -IR consult for temporal artery biopsy - although this is less likely to be positive given steroids have already been started  HTN -Continue  Cozaar -Hold Estradiol, Celebrex, and Voltaren - while less suspicion for CVA, these medications are probably suboptimal for lots of reasons  HLD -Continue Pravachol  Asthma -Continue Singulair -She takes Theophylline; I attempted to hold this based on high side effect profile and comparatively low utility but the patient asked for this to be continued.   DVT prophylaxis: Lovenox Code Status:  DNR - confirmed with patient/family Family Communication: Husband present throughout evaluation  Disposition Plan: Home once clinically improved Consults called: Neurology, IR  Admission status: Admit - It is my clinical opinion that admission to INPATIENT is reasonable and necessary because of the expectation that this patient will require hospital care that crosses at least 2 midnights to treat this condition based on the medical complexity of the problems presented.  Given the aforementioned information, the predictability of an adverse outcome is felt to be significant.    Karmen Bongo MD [facility]  If note is complete, please contact covering daytime or nighttime physician. [email or web address] Password [facility]  07/18/2017, 6:05 PM

## 2017-07-18 NOTE — ED Provider Notes (Signed)
[facility] EMERGENCY DEPARTMENT Provider Note   CSN: [id number] Arrival date & time: 07/18/17  0515     History   Chief Complaint Chief Complaint  Patient presents with  . Eye Problem    HPI Crystal [name] is a 72 y.o. female.  The history is provided by the patient, medical records and the spouse. No language interpreter was used.  Neurologic Problem  This is a new problem. The current episode started 2 days ago. The problem occurs constantly. The problem has been rapidly worsening. Associated symptoms include headaches. Pertinent negatives include no chest pain, no abdominal pain and no shortness of breath. Nothing aggravates the symptoms. Nothing relieves the symptoms. She has tried nothing for the symptoms. The treatment provided no relief.    Past Medical History:  Diagnosis Date  . Adenomatous colon polyp 2007 & 2010    Dr [name]  . Allergy    seasonal  . Arthritis   . Asthma   . Cataract   . Diverticulosis   . Hyperlipidemia   . Hypertension   . IBS (irritable bowel syndrome)   . MVP (mitral valve prolapse)   . Peripheral vascular disease ([place])   . RAD (reactive airway disease)     Patient Active Problem List   Diagnosis Date Noted  . 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 Parotitis 10/06/2016  . Osteoarthritis 06/25/2016  . Anxiety 06/25/2016  . Back pain 07/18/2015  . 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 Osteopenia 06/03/2008  . History of colonic polyps 06/03/2008  . INTENTION TREMOR 12/06/2007  . IBS 10/16/2007  . Hyperlipidemia 06/06/2007  . Essential hypertension 06/06/2007  . Chronic obstructive airway disease with asthma ([place]) 02/23/2007    Past Surgical History:  Procedure Laterality Date  . ABDOMINAL HYSTERECTOMY     with USO for dysfunctionall menses  . APPENDECTOMY    . CATARACT EXTRACTION W/ INTRAOCULAR LENS IMPLANT  2013   bilateral; Dr [name]  . COLONOSCOPY  2013   negative , due 2018; Dr  [name]  . COLONOSCOPY W/ POLYPECTOMY      X2 ; diverticulosis  . LUMBAR LAMINECTOMY  2000   Dr Vertell Limber  . SHOULDER SURGERY     R shoulder  . TONSILLECTOMY      OB History    No data available       Home Medications    Prior to Admission medications   Medication Sig Start Date End Date Taking? Authorizing Provider  ALPRAZolam Duanne Moron) 0.25 MG tablet Take 0.25 mg by mouth at bedtime as needed for anxiety.    [provider]  Butenafine HCl 1 % cream Apply topically twice daily for seven days 12/13/14   Hendricks Limes, MD  Calcium Carbonate-Vitamin D (CALCIUM 600/VITAMIN D PO) Take by mouth daily.      [provider]  CELEBREX 200 MG capsule Take 1 capsule (200 mg total) by mouth daily. 06/25/16   [name], MD  Cholecalciferol (VITAMIN D3) 1000 UNITS CAPS Take by mouth. 3 by mouth once daily     [provider]  diclofenac sodium (VOLTAREN) 1 % GEL Apply topically at bedtime.    [provider]  Estradiol 10 MCG TABS vaginal tablet estradiol 10 mcg vaginal tablet  Insert 1 tablet twice a week by vaginal route.    [provider]  furosemide (LASIX) 40 MG tablet TAKE ONE TABLET BY MOUTH DAILY 05/13/17   [name], MD  losartan (  COZAAR) 50 MG tablet TAKE 0.5 TABLETS (25 MG TOTAL) BY MOUTH DAILY. 05/24/17   [name], MD  montelukast (SINGULAIR) 10 MG tablet TAKE ONE TABLET BY MOUTH DAILY 07/11/17   [name], MD  pravastatin (PRAVACHOL) 40 MG tablet TAKE 1 TABLET DAILY BY MOUTH AT BEDTIME 07/11/17   Burns, Claudina Lick, MD  Probiotic Product (ALIGN) 4 MG CAPS Take by mouth daily.      [provider]  SYMBICORT 160-4.5 MCG/ACT inhaler INHALE 1 TO 2 PUFFS BY MOUTH EVERY 12 HOURS THEN GARGLE AND SPIT AFTER USE 07/27/16   [name], MD  theophylline (UNIPHYL) 400 MG 24 hr tablet TAKE 1 TABLET (400 MG TOTAL) BY MOUTH DAILY. 07/04/17   [name], MD  trimethoprim (TRIMPEX) 100 MG tablet Take 1 tablet by mouth as needed.  To prevent bladder problems when travels 09/15/11   [provider]  VENTOLIN HFA 108 (90 Base) MCG/ACT inhaler Inhale 1-2 puffs into the lungs every 6 (six) hours as needed for wheezing or shortness of breath. 06/03/17   [name], MD    Family History Family History  Problem Relation Age of Onset  . Heart attack Brother 31  . Parkinsonism Mother   . Stroke Mother   . Heart attack Father        pre 49  . Diabetes Sister   . COPD Sister        X 3  . Other Son        suicide 2016  . Colon cancer Neg Hx   . Cancer Neg Hx   . Esophageal cancer Neg Hx   . Liver cancer Neg Hx   . Pancreatic cancer Neg Hx   . Rectal cancer Neg Hx   . Stomach cancer Neg Hx     Social History Social History   Tobacco Use  . Smoking status: Former Smoker    Packs/day: 1.00    Years: 23.00    Pack years: 23.00    Last attempt to quit: 06/14/1989    Years since quitting: 28.1  . Smokeless tobacco: Never Used  . Tobacco comment: smoked 1961-1991 , up to < 1 ppd  Substance Use Topics  . Alcohol use: Yes    Alcohol/week: 8.4 oz    Types: 14 Glasses of wine per week    Comment: Wine  . Drug use: No     Allergies   Simvastatin and Prednisone   Review of Systems Review of Systems  Constitutional: Negative for chills, diaphoresis, fatigue and fever.  HENT: Negative for congestion.   Eyes: Positive for visual disturbance.  Respiratory: Negative for cough, chest tightness, shortness of breath and wheezing.   Cardiovascular: Negative for chest pain and palpitations.  Gastrointestinal: Negative for abdominal pain, constipation, diarrhea, nausea and vomiting.  Genitourinary: Negative for dysuria.  Musculoskeletal: Negative for back pain, neck pain and neck stiffness.  Neurological: Positive for headaches. Negative for facial asymmetry, weakness, light-headedness and numbness.  Psychiatric/Behavioral: Negative for agitation.  All other systems reviewed and are  negative.    Physical Exam Updated Vital Signs BP 130/66 (BP Location: Right Arm)   Pulse 94   Temp 97.6 F (36.4 C) (Oral)   Resp 18   Ht 5' 4"  (1.626 m)   Wt 53.5 kg (118 lb)   SpO2 97%   BMI 20.25 kg/m   Physical Exam  Constitutional: She appears well-developed and well-nourished. No distress.  HENT:  Head: Normocephalic and atraumatic.  Mouth/Throat: Oropharynx is clear and moist. No oropharyngeal exudate.  Eyes: Conjunctivae and EOM are normal. Left pupil is not reactive. Left pupil is round.  Left pupil is nonreactive.  Right pupil is sluggish.  Normal extraocular movements.  Neck: Normal range of motion.  Cardiovascular: Normal rate and intact distal pulses.  No murmur heard. Pulmonary/Chest: Effort normal and breath sounds normal. No stridor. She has no wheezes. She exhibits no tenderness.  Abdominal: Bowel sounds are normal. She exhibits no distension.  Musculoskeletal: She exhibits tenderness.  Neurological: She is alert. No sensory deficit. She exhibits normal muscle tone.  Skin: Capillary refill takes less than 2 seconds. No rash noted. She is not diaphoretic. No erythema.  Psychiatric: She has a normal mood and affect.  Nursing note and vitals reviewed.    ED Treatments / Results  Labs (all labs ordered are listed, but only abnormal results are displayed) Labs Reviewed  CBC WITH DIFFERENTIAL/PLATELET - Abnormal; Notable for the following components:      Result Value   WBC 13.1 (*)    MCV 102.3 (*)    Platelets 474 (*)    Neutro Abs 9.8 (*)    Monocytes Absolute 1.2 (*)    All other components within normal limits  SEDIMENTATION RATE - Abnormal; Notable for the following components:   Sed Rate 55 (*)    All other components within normal limits  C-REACTIVE PROTEIN - Abnormal; Notable for the following components:   CRP 7.1 (*)    All other components within normal limits  BASIC METABOLIC PANEL  BASIC METABOLIC PANEL  CBC    EKG  EKG  Interpretation None       Radiology Mr Jeri Cos And Wo Contrast  Result Date: 07/18/2017 CLINICAL DATA:  Visual loss or uveitis/scleritis. Three days ago there was blurry vision in the left eye. Two days ago there was complete vision loss in the left eye. Now partial vision loss in the right eye. EXAM: MRI HEAD AND ORBITS WITHOUT AND WITH CONTRAST TECHNIQUE: Multiplanar, multiecho pulse sequences of the brain and surrounding structures were obtained without and with intravenous contrast. Multiplanar, multiecho pulse sequences of the orbits and surrounding structures were obtained including fat saturation techniques, before and after intravenous contrast administration. CONTRAST:  2m MULTIHANCE GADOBENATE DIMEGLUMINE 529 MG/ML IV SOLN COMPARISON:  11/27/2007 head CT FINDINGS: MRI HEAD FINDINGS Brain: No acute infarction, hemorrhage, hydrocephalus, extra-axial collection or mass lesion. Few signal abnormalities mainly in the cerebral white matter, essentially age congruent. No specific demyelinating pattern. Vascular: Major flow voids and vascular enhancements are preserved Skull and upper cervical spine: No evidence of marrow lesion. C3-4 facet arthropathy on the left with mild anterolisthesis. MRI ORBITS FINDINGS Orbits: Status post bilateral cataract resection. Suspect subtle mounding of the optic disc on the left. The optic nerves themselves have symmetric normal signal, but there is perineural strandy appearance on precontrast T1 weighted imaging that enhances. There is no focal masslike area. The superior ophthalmic veins have symmetric signal and enhancement. Negative cavernous sinus region. Normal appearance of the extraocular muscles and lacrimal glands. Visualized sinuses: Clear Soft tissues: Negative IMPRESSION: Orbit MRI: Bilateral optic perineuritis. In addition to idiopathic inflammation this pattern has also been described with giant cell arteritis (reported history of headache and jaw  claudication). Brain MRI: No acute finding. No evidence of elevated intracranial pressure or demyelinating disease. Electronically Signed   By: JMonte FantasiaM.D.   On: 07/18/2017 08:45   Mr ORosealee AlbeeWVUContrast  Result Date: 07/18/2017 CLINICAL DATA:  Visual loss or uveitis/scleritis. Three days ago there was blurry vision in the left eye. Two days ago there was complete vision loss in the left eye. Now partial vision loss in the right eye. EXAM: MRI HEAD AND ORBITS WITHOUT AND WITH CONTRAST TECHNIQUE: Multiplanar, multiecho pulse sequences of the brain and surrounding structures were obtained without and with intravenous contrast. Multiplanar, multiecho pulse sequences of the orbits and surrounding structures were obtained including fat saturation techniques, before and after intravenous contrast administration. CONTRAST:  40m MULTIHANCE GADOBENATE DIMEGLUMINE 529 MG/ML IV SOLN COMPARISON:  11/27/2007 head CT FINDINGS: MRI HEAD FINDINGS Brain: No acute infarction, hemorrhage, hydrocephalus, extra-axial collection or mass lesion. Few signal abnormalities mainly in the cerebral white matter, essentially age congruent. No specific demyelinating pattern. Vascular: Major flow voids and vascular enhancements are preserved Skull and upper cervical spine: No evidence of marrow lesion. C3-4 facet arthropathy on the left with mild anterolisthesis. MRI ORBITS FINDINGS Orbits: Status post bilateral cataract resection. Suspect subtle mounding of the optic disc on the left. The optic nerves themselves have symmetric normal signal, but there is perineural strandy appearance on precontrast T1 weighted imaging that enhances. There is no focal masslike area. The superior ophthalmic veins have symmetric signal and enhancement. Negative cavernous sinus region. Normal appearance of the extraocular muscles and lacrimal glands. Visualized sinuses: Clear Soft tissues: Negative IMPRESSION: Orbit MRI: Bilateral optic perineuritis. In  addition to idiopathic inflammation this pattern has also been described with giant cell arteritis (reported history of headache and jaw claudication). Brain MRI: No acute finding. No evidence of elevated intracranial pressure or demyelinating disease. Electronically Signed   By: JMonte FantasiaM.D.   On: 07/18/2017 08:45    Procedures Procedures (including critical care time)  Medications Ordered in ED Medications  methylPREDNISolone sodium succinate (SOLU-MEDROL) 1,000 mg in sodium chloride 0.9 % 50 mL IVPB (not administered)  metoCLOPramide (REGLAN) injection 10 mg (10 mg Intravenous Given 07/18/17 0634)  gadobenate dimeglumine (MULTIHANCE) injection 15 mL (11 mLs Intravenous Contrast Given 07/18/17 0830)     Initial Impression / Assessment and Plan / ED Course  I have reviewed the triage vital signs and the nursing notes.  Pertinent labs & imaging results that were available during my care of the patient were reviewed by me and considered in my medical decision making (see chart for details).     Care assumed from Dr. NKathrynn Humbleat 8:28 AM  Crystal POHLMANis a 72y.o. female with a past medical history significant for asthma, hypertension, hyperlipidemia, reactive airway disease who presents with vision changes.  According to previous provider, over the last several days patient has completely lost vision in her left eye and is losing vision in her right eye.  She reports that she was being treated for TMJ problems.  At time of transfer care, patient is awaiting results of MRI for testing.  Differential diagnosis includes intrinsic eye problem, stroke, optic neuritis, and temporal arteritis.  ESR and CRP are elevated on labs.  We will likely speak with neurology for consultation and recommendations when MRI has resulted.  10:36 AM Patient complete her MRI showing concern for temporal arteritis with bilateral optic perineuritis.  No other evidence of stroke or abnormality seen.  On my  evaluation, patient has complete blindness in her left eye and then has no vision in her lower fields of the right eye.  Patient's neuro exam was otherwise intact.  Normal sensation, strength, and coordination.  No facial droop.  Patient had mild tenderness bilaterally in her temporal areas where her headache resides.  Lungs clear and chest nontender.  Given concern for temporal arteritis with rapidly progressive vision loss, 1 g of Solu-Medrol was ordered and neurology was called.  They will see the patient but agreed she needs admission for further management of her temporal arteritis.   Hospitalist team will be called for admission.   Final Clinical Impressions(s) / ED Diagnoses   Final diagnoses:  Acute loss of vision, bilateral  Vision loss, bilateral    Clinical Impression: 1. Acute loss of vision, bilateral   2. Vision loss, bilateral   3. Temporal arteritis ([place])     Disposition: Admit  This note was prepared with assistance of Dragon voice recognition software. Occasional wrong-word or sound-a-like substitutions may have occurred due to the inherent limitations of voice recognition software.     Jaizon Deroos, Gwenyth Allegra, MD 07/18/17 2001

## 2017-07-18 NOTE — Progress Notes (Signed)
Patient arrived to the unit alert and oriented X 4   Pain level is a 8 , Location is head and back of the neck. Nurse will give tylenol.   Patient states right eye is blurred to the point she can't see anything ,left eye is also blurry with very little vision. Left eye reactive to light, right eye is non-reactive.  All questions and concern addressed, bed in lowest position and call light in reach.

## 2017-07-18 NOTE — ED Notes (Signed)
Report given to accepting RN on [facility]

## 2017-07-18 NOTE — ED Notes (Signed)
No answer in waiting room for treatment room. 

## 2017-07-18 NOTE — ED Provider Notes (Signed)
[facility] EMERGENCY DEPARTMENT Provider Note   CSN: [id number] Arrival date & time: 07/18/17  0515     History   Chief Complaint Chief Complaint  Patient presents with  . Eye Problem    HPI Crystal [name] is a 72 y.o. female.  HPI  72 year old female with history of hypertension, IBS, mitral valve prolapse comes in with chief complaint of vision loss.  Patient states that she has been having generalized frontal headache for the last several days.  Patient also has been having jaw claudication   3 days ago patient started having blurry vision to the left eye, and 2 days ago she had complete vision loss on the left eye.  Patient saw her optometrist who recommended that she go to the ER for stat MRI.  Patient came to the ED, however left because of bleeding times.  Patient woke up this morning and noted that she had partial vision loss in her right eye therefore she decided to come to the ER.  Patient denies any associated numbness, tingling, dizziness, weakness.  There is no family history of MS in the family.  Past Medical History:  Diagnosis Date  . Adenomatous colon polyp 2007 & 2010    Dr [name]  . Allergy    seasonal  . Arthritis   . Asthma   . Cataract   . Diverticulosis   . Hyperlipidemia   . Hypertension   . IBS (irritable bowel syndrome)   . MVP (mitral valve prolapse)   . Peripheral vascular disease ([place])   . RAD (reactive airway disease)     Patient Active Problem List   Diagnosis Date Noted  . 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 Parotitis 10/06/2016  . Osteoarthritis 06/25/2016  . Anxiety 06/25/2016  . Back pain 07/18/2015  . 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 Osteopenia 06/03/2008  . History of colonic polyps 06/03/2008  . INTENTION TREMOR 12/06/2007  . IBS 10/16/2007  . Hyperlipidemia 06/06/2007  . Essential hypertension 06/06/2007  . Chronic obstructive airway disease with asthma ([place]) 02/23/2007     Past Surgical History:  Procedure Laterality Date  . ABDOMINAL HYSTERECTOMY     with USO for dysfunctionall menses  . APPENDECTOMY    . CATARACT EXTRACTION W/ INTRAOCULAR LENS IMPLANT  2013   bilateral; Dr [name]  . COLONOSCOPY  2013   negative , due 2018; Dr [name]  . COLONOSCOPY W/ POLYPECTOMY      X2 ; diverticulosis  . LUMBAR LAMINECTOMY  2000   Dr Vertell Limber  . SHOULDER SURGERY     R shoulder  . TONSILLECTOMY      OB History    No data available       Home Medications    Prior to Admission medications   Medication Sig Start Date End Date Taking? Authorizing Provider  ALPRAZolam Duanne Moron) 0.25 MG tablet Take 0.25 mg by mouth at bedtime as needed for anxiety.    [provider]  Butenafine HCl 1 % cream Apply topically twice daily for seven days 12/13/14   Hendricks Limes, MD  Calcium Carbonate-Vitamin D (CALCIUM 600/VITAMIN D PO) Take by mouth daily.      [provider]  CELEBREX 200 MG capsule Take 1 capsule (200 mg total) by mouth daily. 06/25/16   [name], MD  Cholecalciferol (VITAMIN D3) 1000 UNITS CAPS Take by mouth. 3 by mouth once daily     [provider]  diclofenac sodium (VOLTAREN)  1 % GEL Apply topically at bedtime.    [provider]  Estradiol 10 MCG TABS vaginal tablet estradiol 10 mcg vaginal tablet  Insert 1 tablet twice a week by vaginal route.    [provider]  furosemide (LASIX) 40 MG tablet TAKE ONE TABLET BY MOUTH DAILY 05/13/17   Burns, Claudina Lick, MD  losartan (COZAAR) 50 MG tablet TAKE 0.5 TABLETS (25 MG TOTAL) BY MOUTH DAILY. 05/24/17   [name], MD  montelukast (SINGULAIR) 10 MG tablet TAKE ONE TABLET BY MOUTH DAILY 07/11/17   [name], MD  pravastatin (PRAVACHOL) 40 MG tablet TAKE 1 TABLET DAILY BY MOUTH AT BEDTIME 07/11/17   Burns, Claudina Lick, MD  Probiotic Product (ALIGN) 4 MG CAPS Take by mouth daily.      [provider]  SYMBICORT 160-4.5 MCG/ACT inhaler INHALE 1 TO 2 PUFFS  BY MOUTH EVERY 12 HOURS THEN GARGLE AND SPIT AFTER USE 07/27/16   [name], MD  theophylline (UNIPHYL) 400 MG 24 hr tablet TAKE 1 TABLET (400 MG TOTAL) BY MOUTH DAILY. 07/04/17   [name], MD  trimethoprim (TRIMPEX) 100 MG tablet Take 1 tablet by mouth as needed. To prevent bladder problems when travels 09/15/11   [provider]  VENTOLIN HFA 108 (90 Base) MCG/ACT inhaler Inhale 1-2 puffs into the lungs every 6 (six) hours as needed for wheezing or shortness of breath. 06/03/17   [name], MD    Family History Family History  Problem Relation Age of Onset  . Heart attack Brother 86  . Parkinsonism Mother   . Stroke Mother   . Heart attack Father        pre 25  . Diabetes Sister   . COPD Sister        X 3  . Other Son        suicide 2016  . Colon cancer Neg Hx   . Cancer Neg Hx   . Esophageal cancer Neg Hx   . Liver cancer Neg Hx   . Pancreatic cancer Neg Hx   . Rectal cancer Neg Hx   . Stomach cancer Neg Hx     Social History Social History   Tobacco Use  . Smoking status: Former Smoker    Packs/day: 1.00    Years: 23.00    Pack years: 23.00    Last attempt to quit: 06/14/1989    Years since quitting: 28.1  . Smokeless tobacco: Never Used  . Tobacco comment: smoked 1961-1991 , up to < 1 ppd  Substance Use Topics  . Alcohol use: Yes    Alcohol/week: 8.4 oz    Types: 14 Glasses of wine per week    Comment: Wine  . Drug use: No     Allergies   Simvastatin and Prednisone   Review of Systems Review of Systems  Constitutional: Positive for activity change.  Eyes: Positive for visual disturbance. Negative for pain.  Respiratory: Negative for shortness of breath.   Cardiovascular: Negative for chest pain.  Gastrointestinal: Negative for abdominal pain, nausea and vomiting.  Musculoskeletal: Negative for neck pain and neck stiffness.  Skin: Negative for rash.  Neurological: Positive for headaches. Negative for facial asymmetry, weakness,  light-headedness and numbness.     Physical Exam Updated Vital Signs BP 130/66 (BP Location: Right Arm)   Pulse 94   Temp 97.6 F (36.4 C) (Oral)   Resp 18   Ht 5\' 4"  (1.626 m)   Wt  53.5 kg (118 lb)   SpO2 97%   BMI 20.25 kg/m   Physical Exam  Constitutional: She is oriented to person, place, and time. She appears well-developed.  HENT:  Head: Normocephalic and atraumatic.  No nystagmus. Pupils are unequal, right pupil is 3 mm, left pupil is 2 mm. Darrall Dears pupil on the left side  Eyes: EOM are normal.  Neck: Normal range of motion. Neck supple.  Cardiovascular: Normal rate.  Pulmonary/Chest: Effort normal.  Abdominal: Bowel sounds are normal.  Neurological: She is alert and oriented to person, place, and time. No cranial nerve deficit. Coordination normal.  Skin: Skin is warm and dry.  Nursing note and vitals reviewed.    ED Treatments / Results  Labs (all labs ordered are listed, but only abnormal results are displayed) Labs Reviewed  CBC WITH DIFFERENTIAL/PLATELET - Abnormal; Notable for the following components:      Result Value   WBC 13.1 (*)    MCV 102.3 (*)    Platelets 474 (*)    Neutro Abs 9.8 (*)    Monocytes Absolute 1.2 (*)    All other components within normal limits  C-REACTIVE PROTEIN - Abnormal; Notable for the following components:   CRP 7.1 (*)    All other components within normal limits  BASIC METABOLIC PANEL  SEDIMENTATION RATE    EKG  EKG Interpretation None       Radiology No results found.  Procedures Procedures (including critical care time)  Medications Ordered in ED Medications  metoCLOPramide (REGLAN) injection 10 mg (10 mg Intravenous Given 07/18/17 6712)     Initial Impression / Assessment and Plan / ED Course  I have reviewed the triage vital signs and the nursing notes.  Pertinent labs & imaging results that were available during my care of the patient were reviewed by me and considered in my medical decision  making (see chart for details).     72 year old female comes in with chief complaint of vision loss.  Patient has been having headaches for several days along with jaw pain, and patient is being treated as TMJ.  Patient is noted to have [employer] pupil on the left side.   Differential diagnosis includes optic neuritis, temporal arteritis, MS. less likely to be stroke due to bilateral symptomology.  MRI brain with and without contrast and MRI orbits ordered.  Dr. Sherry Ruffing to take over care. CRP is elevated, sed rate is pending at this time.  Once the MRI is done, patient might need ophthalmology consultation if it is normal. If the sed rate is significantly high then patient will be treated as presumed temporal cell arteritis.  Final Clinical Impressions(s) / ED Diagnoses   Final diagnoses:  Acute loss of vision, bilateral    ED Discharge Orders    None       Varney Biles, MD 07/18/17 [phone]

## 2017-07-18 NOTE — Consult Note (Signed)
Neurology Consultation Reason for Consult: Temporal arteritis Referring Physician: ED Provider  CC: Vision loss  History is obtained from: Patient  HPI: Crystal [name] is a 72 y.o. female with PMHx of asthma, HTN, HLD, OA, cataract who presents to the ED with complaint of vision loss and headaches.  Patient reports that symptoms began on Saturday with blurred vision in her left eye that progressed to vision loss on Sunday.  She went to her eye doctor office Sunday who subsequently recommended she go to the ED for evaluation.  She did present to the ED on Sunday, but eventually left due to long wait times.  She was scheduled to follow up with her PCP today for elevated WBC.  However, upon waking this morning, she continued to have vision loss in the left eye but now also had blurry vision in the right with loss of her lower visual fields.  She called her eye doctor again who advised her to go to the ED.  She denies any abdominal pain, chest pain, SOB.      Patient reports she has been experiencing jaw pain and headaches since December.  Was evaluated by her PCP and given muscle relaxants and had a mouth guard made.  She describes her headaches as unrelenting, located bitemporal and across her forehead.  No clear inciting factors.  Her jaw claudication symptoms have progressed to the point of tolerating mostly soft diet.  In the ED, there was concern for blindness that was being caused by temporal arteritis.  MRI brain was negative, MRI orbits with bilateral optic perineuritis.  ESR and CRP were elevated, WBC 13.  She was given 1021m IV Solumedrol and admitted to the hospitalist service.     ROS: A 14 point ROS was performed and is negative except as noted in the HPI.   Past Medical History:  Diagnosis Date  . Adenomatous colon polyp 2007 & 2010    Dr SFuller Plan . Allergy    seasonal  . Arthritis   . Cataract   . Diverticulosis   . Hyperlipidemia   . Hypertension   . IBS (irritable bowel  syndrome)   . MVP (mitral valve prolapse)   . Peripheral vascular disease ([facility]   . RAD (reactive airway disease)      Family History  Problem Relation Age of Onset  . Heart attack Brother 481 . Parkinsonism Mother   . Stroke Mother   . Heart attack Father        pre 537 . Diabetes Sister   . COPD Sister        X 3  . Other Son        suicide 2016  . Colon cancer Neg Hx   . Cancer Neg Hx   . Esophageal cancer Neg Hx   . Liver cancer Neg Hx   . Pancreatic cancer Neg Hx   . Rectal cancer Neg Hx   . Stomach cancer Neg Hx      Social History:  reports that she quit smoking about 28 years ago. She has a 23.00 pack-year smoking history. she has never used smokeless tobacco. She reports that she drinks about 8.4 oz of alcohol per week. She reports that she does not use drugs.   Exam: Current vital signs: BP 117/71   Pulse 83   Temp 97.6 F (36.4 C) (Oral)   Resp 18   Ht 5' 4" (1.626 m)   Wt 118 lb (53.5 kg)  SpO2 95%   BMI 20.25 kg/m  Vital signs in last 24 hours: Temp:  [97.6 F (36.4 C)-97.8 F (36.6 C)] 97.6 F (36.4 C) (02/04 0515) Pulse Rate:  [70-94] 83 (02/04 1115) Resp:  [12-18] 18 (02/04 1115) BP: (117-145)/(55-71) 117/71 (02/04 1115) SpO2:  [95 %-98 %] 95 % (02/04 1115) Weight:  [118 lb (53.5 kg)] 118 lb (53.5 kg) (02/04 0519)   Physical Exam  Constitutional: Pleasant, elderly woman, lying in bed, NAD Psych: Affect appropriate to situation Eyes: No scleral injection Head: Normocephalic, atraumatic.  She has tenderness over her left and right temporal artery as well as bilateral TMJ.  Neck: No carotid bruit appreciated Cardiovascular: Normal rate and regular rhythm.  Respiratory: Effort normal, non-labored breathing GI: Soft.  No distension. There is no tenderness.  Skin: WDI  Neuro: Mental Status: Patient is awake, alert, oriented to person, place, month, year, and situation. Patient is able to give a clear and coherent history. No signs of  aphasia or neglect Cranial Nerves: II: There is left sided vision loss and loss of lower visual fields on the right. Pupils are equal, round, and reactive to light.   III,IV, VI: EOMI without ptosis or diploplia.  V: Facial sensation is symmetric to touch VII: Facial movement is symmetric.  VIII: hearing is intact to voice X: Uvula elevates symmetrically XI: Shoulder shrug is symmetric. XII: tongue is midline without atrophy or fasciculations.  Motor: Tone is normal. Bulk is normal. 5/5 strength was present in all four extremities.  Sensory: Sensation is symmetric to light touch in the arms and legs.  I have reviewed labs in epic and the results pertinent to this consultation are: ESR 55 CRP 7.1 Na 139, K 3.8, BUN/Cr 8/0.60 WBC 13, Hgb 12.7, HCT 40, PLT 474  I have reviewed the images obtained: MRI Orbits: Bilateral optic perineuritis MRI Brain: No acute finding. No evidence of elevated intracranial pressure or demyelinating disease.  Impression:  71 y.o. female with PMHx of asthma, HTN, HLD, OA, cataract who presents to the ED with complaint of vision loss and headaches.  Her symptoms and MRI are consistent with temporal arteritis.  Her ESR and CRP are also both elevated.  MRI brain did not show a stroke as potential cause of her symptoms.  Recommendations: 1) Given her visual symptoms and strong suspicion of GCA as the cause, recommend Solumedrol 1000mg IV daily for 3 days followed by prednisone 1mg/kg/day (maximum of 60 mg/day) 2) Recommend temporal artery biopsy 3) Consider addition of low-dose aspirin given likely new diagnosis of GCA.  Data from observational studies support that the co-administration of low-dose aspirin further reduces the risk of blindness and deserves consideration.  If low-dose aspirin is used, recommend PPI as well due to risk of bleeding with high dose steroids.    ** This plan has yet to be discussed with attending neurologist**    ,  PGY3 07/18/2017, 11:27 AM  Internal Medicine Pager: [phone]   

## 2017-07-19 DIAGNOSIS — H53133 Sudden visual loss, bilateral: Secondary | ICD-10-CM

## 2017-07-19 DIAGNOSIS — R51 Headache: Secondary | ICD-10-CM

## 2017-07-19 LAB — BASIC METABOLIC PANEL
Anion gap: 14 (ref 5–15)
BUN: 10 mg/dL (ref 6–20)
CO2: 22 mmol/L (ref 22–32)
Calcium: 9.1 mg/dL (ref 8.9–10.3)
Chloride: 101 mmol/L (ref 101–111)
Creatinine, Ser: 0.52 mg/dL (ref 0.44–1.00)
GFR calc Af Amer: >60 mL/min (ref >60)
GFR calc non Af Amer: >60 mL/min (ref >60)
Glucose, Bld: 129 mg/dL — ABNORMAL HIGH (ref 65–99)
Potassium: 3.7 mmol/L (ref 3.5–5.1)
Sodium: 137 mmol/L (ref 135–145)

## 2017-07-19 LAB — CBC
HCT: 36.8 % (ref 36.0–46.0)
Hemoglobin: 11.7 g/dL — ABNORMAL LOW (ref 12.0–15.0)
MCH: 31.6 pg (ref 26.0–34.0)
MCHC: 31.8 g/dL (ref 30.0–36.0)
MCV: 99.5 fL (ref 78.0–100.0)
Platelets: 451 10*3/uL — ABNORMAL HIGH (ref 150–400)
RBC: 3.7 MIL/uL — ABNORMAL LOW (ref 3.87–5.11)
RDW: 14.6 % (ref 11.5–15.5)
WBC: 6.1 10*3/uL (ref 4.0–10.5)

## 2017-07-19 MED ORDER — PANTOPRAZOLE SODIUM 40 MG IV SOLR
40.0000 mg | INTRAVENOUS | Status: DC
  Administered 2017-07-19 – 2017-07-21 (×3): 40 mg via INTRAVENOUS
  Filled 2017-07-19 (×3): qty 40

## 2017-07-19 MED ORDER — FUROSEMIDE 40 MG PO TABS
40.0000 mg | ORAL_TABLET | Freq: Every day | ORAL | Status: DC
  Administered 2017-07-20 – 2017-07-22 (×3): 40 mg via ORAL
  Filled 2017-07-19 (×3): qty 1

## 2017-07-19 NOTE — Progress Notes (Signed)
PROGRESS NOTE    Crystal [name]  PNT:[id number] DOB: [birth year] DOA: 07/18/2017 PCP: [name], MD    Brief Narrative:   Pt admitted with sudden vision loss. She is from home with her spouse.            Dx with temporal arteritis based on imaging study. Vascular consulted for biopsy  Assessment & Plan:   Principal Problem:   Vision, loss, sudden, bilateral/Temporal arteritis [facility]) - Neurology on board. Patient is on Solu-Medrol 1000 mg daily. Agree. According to up-to-date patient should see improvement if there is any within 24-48 hours of commencing therapy. - Vascular surgery consulted for temporal artery biopsy  Active Problems:   Hyperlipidemia - Stable continue statin   Essential hypertension   TMJ arthralgia    DVT prophylaxis: Lovenox Code Status: Full Family Communication: Discussed with patient and spouse Disposition Plan: Pending hospital course   Consultants:   Neurology  Vascular surgery  Procedures: none  Antimicrobials: None   Subjective: Patient has no new complaints. No acute issues overnight  Objective: Vitals:   07/19/17 0821 07/19/17 0946 07/19/17 1445 07/19/17 1804  BP:  133/89 136/61 (!) 125/57  Pulse:  80 92 78  Resp:  18 16 16   Temp:  97.6 F (36.4 C) 97.9 F (36.6 C) 98.2 F (36.8 C)  TempSrc:  Oral Oral Oral  SpO2: 97% 95% 99% 93%  Weight:      Height:        Intake/Output Summary (Last 24 hours) at 07/19/2017 1903 Last data filed at 07/19/2017 0300 Gross per 24 hour  Intake 0 ml  Output -  Net 0 ml   Filed Weights   07/18/17 0519  Weight: 53.5 kg (118 lb)    Examination:  General exam: Appears calm and comfortable, in nad. Respiratory system: Clear to auscultation. Respiratory effort normal. Equal chest rise. Cardiovascular system: S1 & S2 heard, RRR. No JVD, murmurs, rubs Gastrointestinal system: Abdomen is nondistended, soft and nontender. No organomegaly or masses felt. Normal bowel sounds heard. Central  nervous system: Alert and oriented. Loss of vision Extremities: warm, no deformities Skin: No rashes, lesions or ulcers, on limited exam. Psychiatry: Mood & affect appropriate.     Data Reviewed: I have personally reviewed following labs and imaging studies  CBC: Recent Labs  Lab 07/18/17 0542 07/19/17 0527  WBC 13.1* 6.1  NEUTROABS 9.8*  --   HGB 12.7 11.7*  HCT 40.3 36.8  MCV 102.3* 99.5  PLT 474* 086*   Basic Metabolic Panel: Recent Labs  Lab 07/18/17 0542 07/19/17 0527  NA 139 137  K 3.8 3.7  CL 101 101  CO2 23 22  GLUCOSE 86 129*  BUN 8 10  CREATININE 0.60 0.52  CALCIUM 9.4 9.1   GFR: Estimated Creatinine Clearance: 52.9 mL/min (by C-G formula based on SCr of 0.52 mg/dL). Liver Function Tests: No results for input(s): AST, ALT, ALKPHOS, BILITOT, PROT, ALBUMIN in the last 168 hours. No results for input(s): LIPASE, AMYLASE in the last 168 hours. No results for input(s): AMMONIA in the last 168 hours. Coagulation Profile: No results for input(s): INR, PROTIME in the last 168 hours. Cardiac Enzymes: No results for input(s): CKTOTAL, CKMB, CKMBINDEX, TROPONINI in the last 168 hours. BNP (last 3 results) No results for input(s): PROBNP in the last 8760 hours. HbA1C: No results for input(s): HGBA1C in the last 72 hours. CBG: No results for input(s): GLUCAP in the last 168 hours. Lipid Profile: No results for input(s):  CHOL, HDL, LDLCALC, TRIG, CHOLHDL, LDLDIRECT in the last 72 hours. Thyroid Function Tests: No results for input(s): TSH, T4TOTAL, FREET4, T3FREE, THYROIDAB in the last 72 hours. Anemia Panel: No results for input(s): VITAMINB12, FOLATE, FERRITIN, TIBC, IRON, RETICCTPCT in the last 72 hours. Sepsis Labs: No results for input(s): PROCALCITON, LATICACIDVEN in the last 168 hours.  No results found for this or any previous visit (from the past 240 hour(s)).       Radiology Studies: Mr Jeri Cos And Wo Contrast  Result Date: 07/18/2017 CLINICAL  DATA:  Visual loss or uveitis/scleritis. Three days ago there was blurry vision in the left eye. Two days ago there was complete vision loss in the left eye. Now partial vision loss in the right eye. EXAM: MRI HEAD AND ORBITS WITHOUT AND WITH CONTRAST TECHNIQUE: Multiplanar, multiecho pulse sequences of the brain and surrounding structures were obtained without and with intravenous contrast. Multiplanar, multiecho pulse sequences of the orbits and surrounding structures were obtained including fat saturation techniques, before and after intravenous contrast administration. CONTRAST:  62mL MULTIHANCE GADOBENATE DIMEGLUMINE 529 MG/ML IV SOLN COMPARISON:  11/27/2007 head CT FINDINGS: MRI HEAD FINDINGS Brain: No acute infarction, hemorrhage, hydrocephalus, extra-axial collection or mass lesion. Few signal abnormalities mainly in the cerebral white matter, essentially age congruent. No specific demyelinating pattern. Vascular: Major flow voids and vascular enhancements are preserved Skull and upper cervical spine: No evidence of marrow lesion. C3-4 facet arthropathy on the left with mild anterolisthesis. MRI ORBITS FINDINGS Orbits: Status post bilateral cataract resection. Suspect subtle mounding of the optic disc on the left. The optic nerves themselves have symmetric normal signal, but there is perineural strandy appearance on precontrast T1 weighted imaging that enhances. There is no focal masslike area. The superior ophthalmic veins have symmetric signal and enhancement. Negative cavernous sinus region. Normal appearance of the extraocular muscles and lacrimal glands. Visualized sinuses: Clear Soft tissues: Negative IMPRESSION: Orbit MRI: Bilateral optic perineuritis. In addition to idiopathic inflammation this pattern has also been described with giant cell arteritis (reported history of headache and jaw claudication). Brain MRI: No acute finding. No evidence of elevated intracranial pressure or demyelinating  disease. Electronically Signed   By: Monte Fantasia M.D.   On: 07/18/2017 08:45   Mr Rosealee Albee FT Contrast  Result Date: 07/18/2017 CLINICAL DATA:  Visual loss or uveitis/scleritis. Three days ago there was blurry vision in the left eye. Two days ago there was complete vision loss in the left eye. Now partial vision loss in the right eye. EXAM: MRI HEAD AND ORBITS WITHOUT AND WITH CONTRAST TECHNIQUE: Multiplanar, multiecho pulse sequences of the brain and surrounding structures were obtained without and with intravenous contrast. Multiplanar, multiecho pulse sequences of the orbits and surrounding structures were obtained including fat saturation techniques, before and after intravenous contrast administration. CONTRAST:  14mL MULTIHANCE GADOBENATE DIMEGLUMINE 529 MG/ML IV SOLN COMPARISON:  11/27/2007 head CT FINDINGS: MRI HEAD FINDINGS Brain: No acute infarction, hemorrhage, hydrocephalus, extra-axial collection or mass lesion. Few signal abnormalities mainly in the cerebral white matter, essentially age congruent. No specific demyelinating pattern. Vascular: Major flow voids and vascular enhancements are preserved Skull and upper cervical spine: No evidence of marrow lesion. C3-4 facet arthropathy on the left with mild anterolisthesis. MRI ORBITS FINDINGS Orbits: Status post bilateral cataract resection. Suspect subtle mounding of the optic disc on the left. The optic nerves themselves have symmetric normal signal, but there is perineural strandy appearance on precontrast T1 weighted imaging that enhances. There is no focal  masslike area. The superior ophthalmic veins have symmetric signal and enhancement. Negative cavernous sinus region. Normal appearance of the extraocular muscles and lacrimal glands. Visualized sinuses: Clear Soft tissues: Negative IMPRESSION: Orbit MRI: Bilateral optic perineuritis. In addition to idiopathic inflammation this pattern has also been described with giant cell arteritis  (reported history of headache and jaw claudication). Brain MRI: No acute finding. No evidence of elevated intracranial pressure or demyelinating disease. Electronically Signed   By: Monte Fantasia M.D.   On: 07/18/2017 08:45        Scheduled Meds: . docusate sodium  100 mg Oral BID  . enoxaparin (LOVENOX) injection  40 mg Subcutaneous Q24H  . furosemide  40 mg Oral Daily  . losartan  25 mg Oral Daily  . mometasone-formoterol  2 puff Inhalation BID  . montelukast  10 mg Oral QPM  . pantoprazole (PROTONIX) IV  40 mg Intravenous Q24H  . pravastatin  40 mg Oral q1800  . theophylline  200 mg Oral BID   Continuous Infusions: . methylPREDNISolone (SOLU-MEDROL) injection Stopped (07/19/17 1310)     LOS: 1 day    Time spent: > 35 minutes  Velvet Bathe, MD [facility] Pager [phone]  If 7PM-7AM, please contact night-coverage [email or web address] Password [email or web address] 07/19/2017, 7:03 PM

## 2017-07-19 NOTE — Care Management Note (Signed)
Case Management Note  Patient Details  Name: Crystal [name] MRN: [id number] Date of Birth: Jul 05, 1945  Subjective/Objective:       Pt admitted with sudden vision loss. She is from home with her spouse.             Action/Plan: Plan is for her to return home when medically stable. CM following for d/c needs, physician orders.  Expected Discharge Date:                  Expected Discharge Plan:     In-House Referral:     Discharge planning Services     Post Acute Care Choice:    Choice offered to:     DME Arranged:    DME Agency:     HH Arranged:    HH Agency:     Status of Service:  In process, will continue to follow  If discussed at Long Length of Stay Meetings, dates discussed:    Additional Comments:  Pollie Friar, RN 07/19/2017, 11:55 AM

## 2017-07-19 NOTE — Plan of Care (Signed)
  Clinical Measurements: Ability to maintain clinical measurements within normal limits will improve 07/19/2017 0106 - Progressing by Mikey College, RN   Clinical Measurements: Will remain free from infection 07/19/2017 0106 - Progressing by Mikey College, RN   Clinical Measurements: Respiratory complications will improve 07/19/2017 0106 - Progressing by Mikey College, RN   Elimination: Will not experience complications related to bowel motility 07/19/2017 0106 - Progressing by Mikey College, RN

## 2017-07-19 NOTE — Consult Note (Signed)
CONSULT NOTE   MRN : [id number]  Reason for Consult: temporal arteritis  Referring Physician: Dr. Wendee Beavers  History of Present Illness:   Crystal [name] is a 72 y.o. (July 31, 1945) female  With cc: bilateral blindness, and severe HA.  We have been consulted to perform a diagnostic temporal artery biopsy.  She has a history of TMJ and cataract replacement surgery, so she though her blurred vision was something to do with these previous problems.  The left eye blurred vision changed to sudden blindness on Saturday.  She was seen on Sunday by her eye doctor and released home with a pending MRI study.  She then lost vision in the right eye and called 911. She was admitted to the hospital and started on high dose steroids.    Past medical history includes:   HTN, hyperlipidemia, hx of tobacco abuse.      Current Facility-Administered Medications  Medication Dose Route Frequency Provider Last Rate Last Dose  . acetaminophen (TYLENOL) tablet 650 mg  650 mg Oral Q6H PRN Karmen Bongo, MD   650 mg at 07/19/17 0401   Or  . acetaminophen (TYLENOL) suppository 650 mg  650 mg Rectal Q6H PRN Karmen Bongo, MD      . ALPRAZolam Duanne Moron) tablet 0.25 mg  0.25 mg Oral QHS PRN Karmen Bongo, MD      . docusate sodium (COLACE) capsule 100 mg  100 mg Oral BID Karmen Bongo, MD   100 mg at 07/18/17 2106  . enoxaparin (LOVENOX) injection 40 mg  40 mg Subcutaneous Q24H Karmen Bongo, MD   40 mg at 07/18/17 1714  . losartan (COZAAR) tablet 25 mg  25 mg Oral Daily Karmen Bongo, MD      . methylPREDNISolone sodium succinate (SOLU-MEDROL) 1,000 mg in sodium chloride 0.9 % 50 mL IVPB  1,000 mg Intravenous Daily Karmen Bongo, MD      . mometasone-formoterol [facility]) 200-5 MCG/ACT inhaler 2 puff  2 puff Inhalation BID Karmen Bongo, MD   2 puff at 07/19/17 0820  . montelukast (SINGULAIR) tablet 10 mg  10 mg Oral QPM Karmen Bongo, MD   10 mg at 07/18/17 1715  . morphine 2 MG/ML injection 2 mg  2 mg  Intravenous Q2H PRN Karmen Bongo, MD      . ondansetron [facility]) tablet 4 mg  4 mg Oral Q6H PRN Karmen Bongo, MD       Or  . ondansetron [facility]) injection 4 mg  4 mg Intravenous Q6H PRN Karmen Bongo, MD      . pantoprazole (PROTONIX) injection 40 mg  40 mg Intravenous Q24H Marliss Coots, PA-C      . pravastatin (PRAVACHOL) tablet 40 mg  40 mg Oral q1800 Karmen Bongo, MD   40 mg at 07/18/17 1715  . theophylline (UNIPHYL) 400 MG 24 hr tablet 200 mg  200 mg Oral BID Karmen Bongo, MD   200 mg at 07/18/17 2105    Pt meds include: Statin :Yes Betablocker: Yes ASA: Yes Other anticoagulants/antiplatelets: none  Past Medical History:  Diagnosis Date  . Adenomatous colon polyp 2007 & 2010    Dr [name]  . Allergy    seasonal  . Arthritis   . Cataract   . Diverticulosis   . Hyperlipidemia   . Hypertension   . IBS (irritable bowel syndrome)   . MVP (mitral valve prolapse)   . Peripheral vascular disease ([place])   . RAD (reactive airway disease)     Past  Surgical History:  Procedure Laterality Date  . ABDOMINAL HYSTERECTOMY     with USO for dysfunctionall menses  . APPENDECTOMY    . CATARACT EXTRACTION W/ INTRAOCULAR LENS IMPLANT  2013   bilateral; Dr [name]  . COLONOSCOPY  2013   negative , due 2018; Dr [name]  . COLONOSCOPY W/ POLYPECTOMY      X2 ; diverticulosis  . LUMBAR LAMINECTOMY  2000   Dr Vertell Limber  . SHOULDER SURGERY     R shoulder  . TONSILLECTOMY      Social History Social History   Tobacco Use  . Smoking status: Former Smoker    Packs/day: 1.00    Years: 23.00    Pack years: 23.00    Last attempt to quit: 06/14/1989    Years since quitting: 28.1  . Smokeless tobacco: Never Used  . Tobacco comment: smoked 1961-1991 , up to < 1 ppd  Substance Use Topics  . Alcohol use: Yes    Alcohol/week: 8.4 oz    Types: 14 Glasses of wine per week    Comment: Wine  . Drug use: No    Family History Family History  Problem Relation Age of Onset  . Heart  attack Brother 73  . Parkinsonism Mother   . Stroke Mother   . Heart attack Father        pre 59  . Diabetes Sister   . COPD Sister        X 3  . Other Son        suicide 2016  . Colon cancer Neg Hx   . Cancer Neg Hx   . Esophageal cancer Neg Hx   . Liver cancer Neg Hx   . Pancreatic cancer Neg Hx   . Rectal cancer Neg Hx   . Stomach cancer Neg Hx     Allergies  Allergen Reactions  . Simvastatin     myalgias  . Prednisone Hives     REVIEW OF SYSTEMS  General: [ ]  Weight loss, [ ]  Fever, [ ]  chills Neurologic: [ ]  Dizziness, [ ]  Blackouts, [ ]  Seizure [ ]  Stroke, [ ]  "Mini stroke", [ ]  Slurred speech, [ ]  Temporary blindness; [ ]  weakness in arms or legs, [ ]  Hoarseness [ ]  Dysphagia [x]  sudden blindness in both eyes Cardiac: [ ]  Chest pain/pressure, [ ]  Shortness of breath at rest [ ]  Shortness of breath with exertion, [ ]  Atrial fibrillation or irregular heartbeat  Vascular: [ ]  Pain in legs with walking, [ ]  Pain in legs at rest, [ ]  Pain in legs at night,  [ ]  Non-healing ulcer, [ ]  Blood clot in vein/DVT,  [x]  sever HA Pulmonary: [ ]  Home oxygen, [ ]  Productive cough, [ ]  Coughing up blood, [ ]  Asthma,  [ ]  Wheezing [ ]  COPD Musculoskeletal:  [x ] Arthritis, [x ] Low back pain, [ ]  Joint pain Hematologic: [ ]  Easy Bruising, [ ]  Anemia; [ ]  Hepatitis Gastrointestinal: [ ]  Blood in stool, [ ]  Gastroesophageal Reflux/heartburn, Urinary: [ ]  chronic Kidney disease, [ ]  on HD - [ ]  MWF or [ ]  TTHS, [ ]  Burning with urination, [ ]  Difficulty urinating Skin: [ ]  Rashes, [ ]  Wounds Psychological: [ ]  Anxiety, [ ]  Depression  Physical Examination Vitals:   07/18/17 2138 07/19/17 0150 07/19/17 0535 07/19/17 0821  BP: (!) 123/46 (!) 110/51 (!) 129/48   Pulse: 70 68 76   Resp: 18 18 18    Temp: 97.8 F (36.6  C) 97.7 F (36.5 C) 97.9 F (36.6 C)   TempSrc: Oral Oral Oral   SpO2: 96% 96% 97% 97%  Weight:      Height:       Body mass index is 20.25 kg/m.  General:   WDWN in NAD HENT: Normocephalic Eyes: Pupils equal, occular motion intact, tracks to void, direct pupillary reflex limited Pulmonary: normal non-labored breathing , without Rales, rhonchi,  wheezing Cardiac: RRR, without  Murmurs, rubs or gallops; No carotid bruits Abdomen: soft, NT, no masses Skin: no rashes, ulcers noted;  no Gangrene , no cellulitis; no open wounds;  Vascular Exam/Pulses:palpable radials, femorals, DP/PT B Musculoskeletal: no muscle wasting or atrophy; no edema  Neurologic: A&O X 3; Appropriate Affect; SENSATION: normal; MOTOR FUNCTION: 5/5 Symmetric; Speech is fluent/normal Psychiatric: Judgment intact, Mood & affect appropriate for pt's clinical situation Lymph : No Cervical, Axillary, or Inguinal lymphadenopathy   Significant Diagnostic Studies: CBC Lab Results  Component Value Date   WBC 6.1 07/19/2017   HGB 11.7 (L) 07/19/2017   HCT 36.8 07/19/2017   MCV 99.5 07/19/2017   PLT 451 (H) 07/19/2017    BMET    Component Value Date/Time   NA 137 07/19/2017 0527   K 3.7 07/19/2017 0527   CL 101 07/19/2017 0527   CO2 22 07/19/2017 0527   GLUCOSE 129 (H) 07/19/2017 0527   GLUCOSE 83 06/01/2006 0950   BUN 10 07/19/2017 0527   CREATININE 0.52 07/19/2017 0527   CALCIUM 9.1 07/19/2017 0527   GFRNONAA >60 07/19/2017 0527   GFRAA >60 07/19/2017 0527   Estimated Creatinine Clearance: 52.9 mL/min (by C-G formula based on SCr of 0.52 mg/dL).  COAG No results found for: INR, PROTIME  ESR 55  CRP 7.1  Non-Invasive Vascular Imaging:  MRI brain: Brain: No acute infarction, hemorrhage, hydrocephalus, extra-axial collection or mass lesion. IMPRESSION: Orbit MRI:  Bilateral optic perineuritis. In addition to idiopathic inflammation this pattern has also been described with giant cell arteritis (reported history of headache and jaw claudication).    ASSESSMENT/PLAN:  1.  B Blindness 2.  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   Roxy Horseman 07/19/2017 9:42 AM  Addendum  I have independently interviewed and examined the patient, and I agree with the physician assistant's findings.  Multiple elements of the history, exam, and lab are consistent with temporal arteritis. Neurology is requesting B temporary biopsies to help guide intensity of steroid therapy.  Patient will be scheduled with Dr. Donnetta Hutching tomorrow.   Risk, benefits, and alternative discussed with the patient.  Risks include but are not limited to: bleeding, infection, cutaneous nerve injury and poor wound healing given steroid use.  Patient agrees to proceed.   Adele Barthel, MD, FACS [facility] [place] Office: [phone] Pager: [phone]  07/19/2017, 12:41 PM

## 2017-07-19 NOTE — Progress Notes (Signed)
Patient ID: Crystal [name], female   DOB: 04/12/1946, 72 y.o.   MRN: [id number]   Request received for temporal artery biopsy  IR does not perform these Rec Vasc surgery or Neuro surgery?  LM to Dr Wendee Beavers Will cancel IR order

## 2017-07-19 NOTE — H&P (View-Only) (Signed)
CONSULT NOTE   MRN : [id number]  Reason for Consult: temporal arteritis  Referring Physician: Dr. Wendee Beavers  History of Present Illness:   Crystal [name] is a 72 y.o. (1946-01-22) female  With cc: bilateral blindness, and severe HA.  We have been consulted to perform a diagnostic temporal artery biopsy.  She has a history of TMJ and cataract replacement surgery, so she though her blurred vision was something to do with these previous problems.  The left eye blurred vision changed to sudden blindness on Saturday.  She was seen on Sunday by her eye doctor and released home with a pending MRI study.  She then lost vision in the right eye and called 911. She was admitted to the hospital and started on high dose steroids.    Past medical history includes:   HTN, hyperlipidemia, hx of tobacco abuse.      Current Facility-Administered Medications  Medication Dose Route Frequency Provider Last Rate Last Dose  . acetaminophen (TYLENOL) tablet 650 mg  650 mg Oral Q6H PRN Karmen Bongo, MD   650 mg at 07/19/17 0401   Or  . acetaminophen (TYLENOL) suppository 650 mg  650 mg Rectal Q6H PRN Karmen Bongo, MD      . ALPRAZolam Duanne Moron) tablet 0.25 mg  0.25 mg Oral QHS PRN Karmen Bongo, MD      . docusate sodium (COLACE) capsule 100 mg  100 mg Oral BID Karmen Bongo, MD   100 mg at 07/18/17 2106  . enoxaparin (LOVENOX) injection 40 mg  40 mg Subcutaneous Q24H Karmen Bongo, MD   40 mg at 07/18/17 1714  . losartan (COZAAR) tablet 25 mg  25 mg Oral Daily Karmen Bongo, MD      . methylPREDNISolone sodium succinate (SOLU-MEDROL) 1,000 mg in sodium chloride 0.9 % 50 mL IVPB  1,000 mg Intravenous Daily Karmen Bongo, MD      . mometasone-formoterol [facility]) 200-5 MCG/ACT inhaler 2 puff  2 puff Inhalation BID Karmen Bongo, MD   2 puff at 07/19/17 0820  . montelukast (SINGULAIR) tablet 10 mg  10 mg Oral QPM Karmen Bongo, MD   10 mg at 07/18/17 1715  . morphine 2 MG/ML injection 2 mg  2 mg  Intravenous Q2H PRN Karmen Bongo, MD      . ondansetron [facility]) tablet 4 mg  4 mg Oral Q6H PRN Karmen Bongo, MD       Or  . ondansetron [facility]) injection 4 mg  4 mg Intravenous Q6H PRN Karmen Bongo, MD      . pantoprazole (PROTONIX) injection 40 mg  40 mg Intravenous Q24H Marliss Coots, PA-C      . pravastatin (PRAVACHOL) tablet 40 mg  40 mg Oral q1800 Karmen Bongo, MD   40 mg at 07/18/17 1715  . theophylline (UNIPHYL) 400 MG 24 hr tablet 200 mg  200 mg Oral BID Karmen Bongo, MD   200 mg at 07/18/17 2105    Pt meds include: Statin :Yes Betablocker: Yes ASA: Yes Other anticoagulants/antiplatelets: none  Past Medical History:  Diagnosis Date  . Adenomatous colon polyp 2007 & 2010    Dr [name]  . Allergy    seasonal  . Arthritis   . Cataract   . Diverticulosis   . Hyperlipidemia   . Hypertension   . IBS (irritable bowel syndrome)   . MVP (mitral valve prolapse)   . Peripheral vascular disease ([place])   . RAD (reactive airway disease)     Past  Surgical History:  Procedure Laterality Date  . ABDOMINAL HYSTERECTOMY     with USO for dysfunctionall menses  . APPENDECTOMY    . CATARACT EXTRACTION W/ INTRAOCULAR LENS IMPLANT  2013   bilateral; Dr [name]  . COLONOSCOPY  2013   negative , due 2018; Dr [name]  . COLONOSCOPY W/ POLYPECTOMY      X2 ; diverticulosis  . LUMBAR LAMINECTOMY  2000   Dr Vertell Limber  . SHOULDER SURGERY     R shoulder  . TONSILLECTOMY      Social History Social History   Tobacco Use  . Smoking status: Former Smoker    Packs/day: 1.00    Years: 23.00    Pack years: 23.00    Last attempt to quit: 06/14/1989    Years since quitting: 28.1  . Smokeless tobacco: Never Used  . Tobacco comment: smoked 1961-1991 , up to < 1 ppd  Substance Use Topics  . Alcohol use: Yes    Alcohol/week: 8.4 oz    Types: 14 Glasses of wine per week    Comment: Wine  . Drug use: No    Family History Family History  Problem Relation Age of Onset  . Heart  attack Brother 51  . Parkinsonism Mother   . Stroke Mother   . Heart attack Father        pre 12  . Diabetes Sister   . COPD Sister        X 3  . Other Son        suicide 2016  . Colon cancer Neg Hx   . Cancer Neg Hx   . Esophageal cancer Neg Hx   . Liver cancer Neg Hx   . Pancreatic cancer Neg Hx   . Rectal cancer Neg Hx   . Stomach cancer Neg Hx     Allergies  Allergen Reactions  . Simvastatin     myalgias  . Prednisone Hives     REVIEW OF SYSTEMS  General: [ ]  Weight loss, [ ]  Fever, [ ]  chills Neurologic: [ ]  Dizziness, [ ]  Blackouts, [ ]  Seizure [ ]  Stroke, [ ]  "Mini stroke", [ ]  Slurred speech, [ ]  Temporary blindness; [ ]  weakness in arms or legs, [ ]  Hoarseness [ ]  Dysphagia [x]  sudden blindness in both eyes Cardiac: [ ]  Chest pain/pressure, [ ]  Shortness of breath at rest [ ]  Shortness of breath with exertion, [ ]  Atrial fibrillation or irregular heartbeat  Vascular: [ ]  Pain in legs with walking, [ ]  Pain in legs at rest, [ ]  Pain in legs at night,  [ ]  Non-healing ulcer, [ ]  Blood clot in vein/DVT,  [x]  sever HA Pulmonary: [ ]  Home oxygen, [ ]  Productive cough, [ ]  Coughing up blood, [ ]  Asthma,  [ ]  Wheezing [ ]  COPD Musculoskeletal:  [x ] Arthritis, [x ] Low back pain, [ ]  Joint pain Hematologic: [ ]  Easy Bruising, [ ]  Anemia; [ ]  Hepatitis Gastrointestinal: [ ]  Blood in stool, [ ]  Gastroesophageal Reflux/heartburn, Urinary: [ ]  chronic Kidney disease, [ ]  on HD - [ ]  MWF or [ ]  TTHS, [ ]  Burning with urination, [ ]  Difficulty urinating Skin: [ ]  Rashes, [ ]  Wounds Psychological: [ ]  Anxiety, [ ]  Depression  Physical Examination Vitals:   07/18/17 2138 07/19/17 0150 07/19/17 0535 07/19/17 0821  BP: (!) 123/46 (!) 110/51 (!) 129/48   Pulse: 70 68 76   Resp: 18 18 18    Temp: 97.8 F (36.6  C) 97.7 F (36.5 C) 97.9 F (36.6 C)   TempSrc: Oral Oral Oral   SpO2: 96% 96% 97% 97%  Weight:      Height:       Body mass index is 20.25 kg/m.  General:   WDWN in NAD HENT: Normocephalic Eyes: Pupils equal, occular motion intact, tracks to void, direct pupillary reflex limited Pulmonary: normal non-labored breathing , without Rales, rhonchi,  wheezing Cardiac: RRR, without  Murmurs, rubs or gallops; No carotid bruits Abdomen: soft, NT, no masses Skin: no rashes, ulcers noted;  no Gangrene , no cellulitis; no open wounds;  Vascular Exam/Pulses:palpable radials, femorals, DP/PT B Musculoskeletal: no muscle wasting or atrophy; no edema  Neurologic: A&O X 3; Appropriate Affect; SENSATION: normal; MOTOR FUNCTION: 5/5 Symmetric; Speech is fluent/normal Psychiatric: Judgment intact, Mood & affect appropriate for pt's clinical situation Lymph : No Cervical, Axillary, or Inguinal lymphadenopathy   Significant Diagnostic Studies: CBC Lab Results  Component Value Date   WBC 6.1 07/19/2017   HGB 11.7 (L) 07/19/2017   HCT 36.8 07/19/2017   MCV 99.5 07/19/2017   PLT 451 (H) 07/19/2017    BMET    Component Value Date/Time   NA 137 07/19/2017 0527   K 3.7 07/19/2017 0527   CL 101 07/19/2017 0527   CO2 22 07/19/2017 0527   GLUCOSE 129 (H) 07/19/2017 0527   GLUCOSE 83 06/01/2006 0950   BUN 10 07/19/2017 0527   CREATININE 0.52 07/19/2017 0527   CALCIUM 9.1 07/19/2017 0527   GFRNONAA >60 07/19/2017 0527   GFRAA >60 07/19/2017 0527   Estimated Creatinine Clearance: 52.9 mL/min (by C-G formula based on SCr of 0.52 mg/dL).  COAG No results found for: INR, PROTIME  ESR 55  CRP 7.1  Non-Invasive Vascular Imaging:  MRI brain: Brain: No acute infarction, hemorrhage, hydrocephalus, extra-axial collection or mass lesion. IMPRESSION: Orbit MRI:  Bilateral optic perineuritis. In addition to idiopathic inflammation this pattern has also been described with giant cell arteritis (reported history of headache and jaw claudication).    ASSESSMENT/PLAN:  1.  B Blindness 2.  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   Roxy Horseman 07/19/2017 9:42 AM  Addendum  I have independently interviewed and examined the patient, and I agree with the physician assistant's findings.  Multiple elements of the history, exam, and lab are consistent with temporal arteritis. Neurology is requesting B temporary biopsies to help guide intensity of steroid therapy.  Patient will be scheduled with Dr. Donnetta Hutching tomorrow.   Risk, benefits, and alternative discussed with the patient.  Risks include but are not limited to: bleeding, infection, cutaneous nerve injury and poor wound healing given steroid use.  Patient agrees to proceed.   Adele Barthel, MD, FACS [facility] [place] Office: [phone] Pager: [phone]  07/19/2017, 12:41 PM

## 2017-07-19 NOTE — Progress Notes (Signed)
Subjective: This morning patient states that she has a mild headache but nothing severe.  She is completely blind, unable to see light, figures, colors, shadows.  She has mild temporal discomfort.  Exam: Vitals:   07/19/17 0535 07/19/17 0821  BP: (!) 129/48   Pulse: 76   Resp: 18   Temp: 97.9 F (36.6 C)   SpO2: 97% 97%    Physical Exam   HEENT-  Normocephalic, no lesions, without obvious abnormality.  Normal external eye and conjunctiva.   Cardiovascular- S1-S2 audible, pulses palpable throughout   Lungs-no rhonchi or wheezing noted, no excessive working breathing.  Saturations within normal limits Abdomen- All 4 quadrants palpated and nontender Extremities- Warm, dry and intact Musculoskeletal-no joint tenderness, deformity or swelling Skin-warm and dry, no hyperpigmentation, vitiligo, or suspicious lesions    Neuro:  Mental Status: Alert, oriented, thought content appropriate.  Speech fluent without evidence of aphasia.  Able to follow 3 step commands without difficulty. Cranial Nerves: II: At this point patient cannot see light, shadows, colors or outline of anything.  This is bilaterally in both eyes III,IV, VI: ptosis not present, extra-ocular motions intact bilaterally pupils equal, round 4-5 mm, non-reactive to light and accommodation V,VII: smile symmetric, facial light touch sensation normal bilaterally VIII: hearing normal bilaterally IX,X: uvula rises symmetrically XI: bilateral shoulder shrug XII: midline tongue extension Motor: Right : Upper extremity   5/5    Left:     Upper extremity   5/5  Lower extremity   5/5     Lower extremity   5/5 Tone and bulk:normal tone throughout; no atrophy noted Sensory: Pinprick and light touch intact throughout, bilaterally Deep Tendon Reflexes: 2+ and symmetric throughout Plantars: Right: downgoing   Left: downgoing Cerebellar: normal finger-to-nose,     Medications:  Scheduled: . docusate sodium  100 mg Oral BID  .  enoxaparin (LOVENOX) injection  40 mg Subcutaneous Q24H  . losartan  25 mg Oral Daily  . mometasone-formoterol  2 puff Inhalation BID  . montelukast  10 mg Oral QPM  . pravastatin  40 mg Oral q1800  . theophylline  200 mg Oral BID   Continuous: . methylPREDNISolone (SOLU-MEDROL) injection     UVO:[id number] **OR** acetaminophen, ALPRAZolam, morphine injection, ondansetron **OR** ondansetron (ZOFRAN) IV  Pertinent Labs/Diagnostics: ESR 55 CRP 7.1 White blood cell count has gone down from 13.1-6.1 Platelets 451  Mr Brain W And Wo Contrast  Result Date: 07/18/2017  IMPRESSION: Orbit MRI: Bilateral optic perineuritis. In addition to idiopathic inflammation this pattern has also been described with giant cell arteritis (reported history of headache and jaw claudication). Brain MRI: No acute finding. No evidence of elevated intracranial pressure or demyelinating disease. Electronically Signed   By: Monte Fantasia M.D.   On: 07/18/2017 08:45   Mr Rosealee Albee ZD Contrast  Result Date: 07/18/2017  IMPRESSION: Orbit MRI: Bilateral optic perineuritis. In addition to idiopathic inflammation this pattern has also been described with giant cell arteritis (reported history of headache and jaw claudication). Brain MRI: No acute finding. No evidence of elevated intracranial pressure or demyelinating disease. Electronically Signed   By: Monte Fantasia M.D.   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  Her clinical presentation of the bilateral temporal pain is very characteristic of temporal arteritis.  Recommendations: 1) continue Solu-Medrol 2) vascular  surgery for temporal artery biopsy 3) protonix for GI protection while on steroids.   Roland Rack, MD [facility] [phone]  If 7pm-  7am, please page neurology on call as listed in [place].  07/19/2017, 8:21 AM

## 2017-07-20 ENCOUNTER — Other Ambulatory Visit: Payer: Self-pay | Admitting: Internal Medicine

## 2017-07-20 DIAGNOSIS — I10 Essential (primary) hypertension: Secondary | ICD-10-CM

## 2017-07-20 DIAGNOSIS — E7849 Other hyperlipidemia: Secondary | ICD-10-CM

## 2017-07-20 MED ORDER — ESCITALOPRAM OXALATE 10 MG PO TABS
10.0000 mg | ORAL_TABLET | Freq: Every day | ORAL | Status: DC
  Administered 2017-07-20 – 2017-07-22 (×3): 10 mg via ORAL
  Filled 2017-07-20 (×3): qty 1

## 2017-07-20 MED ORDER — TRAMADOL HCL 50 MG PO TABS
50.0000 mg | ORAL_TABLET | Freq: Four times a day (QID) | ORAL | Status: DC | PRN
  Administered 2017-07-20 – 2017-07-22 (×5): 50 mg via ORAL
  Filled 2017-07-20 (×6): qty 1

## 2017-07-20 MED ORDER — PREDNISOLONE 5 MG PO TABS
60.0000 mg | ORAL_TABLET | Freq: Every day | ORAL | Status: DC
  Administered 2017-07-21 – 2017-07-22 (×3): 60 mg via ORAL
  Filled 2017-07-20 (×2): qty 12

## 2017-07-20 NOTE — Progress Notes (Signed)
PROGRESS NOTE    Crystal [name]  ZYS:[id number] DOB: [birth year] DOA: 07/18/2017 PCP: [name], MD   Brief Narrative:  Patient is a 72 y.o. 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 She reports having intermittent headache since diagnosed with TMJ, had blurry vision in left eye on last Saturday, progressed to vision loss in left eye on Sunday with worsen headache, and right vision loss on Monday.  She contacted her eye doctor on Monday morning and eye doctor advised her to go to ED, she was brought to ED via EMS service.  In ED, initial physical examination showed no improvement in both visions, CRP elevated, pending ESR.  Dr. Leonel Ramsay from neurology consulted by ED, presumed temporal arteritis, 1g IV solumedrol administrated. Patient was admitted for possible temporal arteritis.   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 Lab shows elevated ESR.  Physical exam shows bilateral temporal arteries tenderness, pupils not reacted to light. -Continue IV Solu-medrol (on day 3), neurology and vascular surgery consulted. Temporal artery biopsy scheduled for tomorrow -Start excitalopram, patient reports feeling anxious due to steroid -Order tramadol prn, patient reports pain not eased by Tylenol but did not think she needs morphine  Essential hypertension -BP stable and controlled.  Continue losartan   Hyperlipidemia  -Continue pravastatin   DVT prophylaxis: Lovenox Code Status:  DNR Family Communication: Husband at bedside  Disposition Plan:  Home   Consultants:   Neurology  Vascular  surgery  Procedures: Antimicrobials:  Subjective: Patient appears calm, alert, oriented. Denies fever/chill, dizziness, chest pain, cough, difficulty breathing, abdominal pain, no changes in bowel habits, leg pain.  Reports 8/10 frontal pain without eye movement and 10/10 pain upon eye movement. Reports no one from her family has any similar disease. Also reports no visual or seeing complete darkness upon shining light to both eyes.  Objective: Vitals:   07/20/17 0129 07/20/17 0513 07/20/17 0700 07/20/17 1011  BP: (!) 133/59 (!) 142/52  (!) 116/56  Pulse: 61 74  81  Resp: 18 18  16   Temp: 98.3 F (36.8 C) 98.5 F (36.9 C)  97.9 F (36.6 C)  TempSrc: Oral Oral  Oral  SpO2: 96% 93% 99% 99%  Weight:      Height:        Intake/Output Summary (Last 24 hours) at 07/20/2017 1128 Last data filed at 07/20/2017 1100 Gross per 24 hour  Intake 240 ml  Output -  Net 240 ml   Filed Weights   07/18/17 0519  Weight: 53.5 kg (118 lb)    Examination:  General exam: Appears calm and comfortable E ENT: No icterus, oral mucosa moist. Pupils not react to light. Tenderness upon light palpation around temporal regions Respiratory system: Clear to auscultation. Respiratory effort normal. No wheezing or crackle Cardiovascular system: S1 & S2 heard, RRR.  No pedal edema. Pain upon palpation at bilateral carotid region  Gastrointestinal system: Abdomen is nondistended, soft and nontender. Normal bowel sounds heard. Central nervous system: Alert and oriented. No focal neurological deficits. Musculoskeletal/Extremities: Symmetric 5 x 5 power.  No joint deformities  Skin: No rashes, lesions or ulcers. Dry and warm to touch  Data Reviewed: I have personally reviewed following labs and imaging studies  CBC: Recent Labs  Lab 07/18/17 0542 07/19/17 0527  WBC 13.1* 6.1  NEUTROABS 9.8*  --   HGB 12.7 11.7*  HCT 40.3 36.8  MCV 102.3* 99.5  PLT 474* 197*   Basic Metabolic Panel: Recent Labs   Lab 07/18/17 0542 07/19/17 0527  NA 139 137  K 3.8 3.7  CL 101 101  CO2 23 22  GLUCOSE 86 129*  BUN 8 10  CREATININE 0.60 0.52  CALCIUM 9.4 9.1   GFR: Estimated Creatinine Clearance: 52.9 mL/min (by C-G formula based on SCr of 0.52 mg/dL). Liver Function Tests: No results for input(s): AST, ALT, ALKPHOS, BILITOT, PROT, ALBUMIN in the last 168 hours. No results for input(s): LIPASE, AMYLASE in the last 168 hours. No results for input(s): AMMONIA in the last 168 hours. Coagulation Profile: No results for input(s): INR, PROTIME in the last 168 hours. Cardiac Enzymes: No results for input(s): CKTOTAL, CKMB, CKMBINDEX, TROPONINI in the last 168 hours. BNP (last 3 results) No results for input(s): PROBNP in the last 8760 hours. HbA1C: No results for input(s): HGBA1C in the last 72 hours. CBG: No results for input(s): GLUCAP in the last 168 hours. Lipid Profile: No results for input(s): CHOL, HDL, LDLCALC, TRIG, CHOLHDL, LDLDIRECT in the last 72 hours. Thyroid Function Tests: No results for input(s): TSH, T4TOTAL, FREET4, T3FREE, THYROIDAB in the last 72 hours. Anemia Panel: No results for input(s): VITAMINB12, FOLATE, FERRITIN, TIBC, IRON, RETICCTPCT in the last 72 hours. Sepsis Labs: No results for input(s): PROCALCITON, LATICACIDVEN in the last 168 hours.  No results found for this or any previous visit (from the past 240 hour(s)).       Radiology Studies: No results found.      Scheduled Meds: . docusate sodium  100 mg Oral BID  . enoxaparin (LOVENOX) injection  40 mg Subcutaneous Q24H  . furosemide  40 mg Oral Daily  . losartan  25 mg Oral Daily  . mometasone-formoterol  2 puff Inhalation BID  . montelukast  10 mg Oral QPM  . pantoprazole (PROTONIX) IV  40 mg Intravenous Q24H  . pravastatin  40 mg Oral q1800  . theophylline  200 mg Oral BID   Continuous Infusions:   LOS: 2 days    Jo-ku Elta [place]) Mervyn Skeeters, PA-S

## 2017-07-20 NOTE — Progress Notes (Signed)
Subjective: No change in eyesight.  She does state that she is seeing stars and white flashing lights at times.  However on formal testing still does not see anything.  Today she is having pain with eye movement especially up in the right frontal forehead region.  Exam: Vitals:   07/20/17 0513 07/20/17 0700  BP: (!) 142/52   Pulse: 74   Resp: 18   Temp: 98.5 F (36.9 C)   SpO2: 93% 99%    Physical Exam   HEENT-  Normocephalic, no lesions, without obvious abnormality.  Normal external eye and conjunctiva.   Cardiovascular- S1-S2 audible, pulses palpable throughout   Lungs-no rhonchi or wheezing noted, no excessive working breathing.  Saturations within normal limits Abdomen- All 4 quadrants palpated and nontender Extremities- Warm, dry and intact Musculoskeletal-no joint tenderness, deformity or swelling Skin-warm and dry, no hyperpigmentation, vitiligo, or suspicious lesions    Neuro:  Mental Status: Alert, oriented, thought content appropriate.  Speech fluent without evidence of aphasia.  Able to follow 3 step commands without difficulty. Cranial Nerves: II: Visual fields grossly normal,  III,IV, VI: ptosis not present, extra-ocular motions intact bilaterally pupils equal, round 5 mm and non-reactive light bilaterally.  With eye movements in all cardinal directions that she has right frontal forehead discomfort. V,VII: smile symmetric, facial light touch sensation normal bilaterally VIII: hearing normal bilaterally IX,X: uvula rises symmetrically XI: bilateral shoulder shrug XII: midline tongue extension Motor: Right : Upper extremity   5/5    Left:     Upper extremity   5/5  Lower extremity   5/5     Lower extremity   5/5 Tone and bulk:normal tone throughout; no atrophy noted Sensory: Pinprick and light touch intact throughout, bilaterally Deep Tendon Reflexes: 2+ and symmetric throughout Plantars: Right: downgoing   Left: downgoing Cerebellar: normal finger-to-nose,  normal rapid alternating movements and normal heel-to-shin test Gait: normal gait and station    Medications:  Scheduled: . docusate sodium  100 mg Oral BID  . enoxaparin (LOVENOX) injection  40 mg Subcutaneous Q24H  . furosemide  40 mg Oral Daily  . losartan  25 mg Oral Daily  . mometasone-formoterol  2 puff Inhalation BID  . montelukast  10 mg Oral QPM  . pantoprazole (PROTONIX) IV  40 mg Intravenous Q24H  . pravastatin  40 mg Oral q1800  . 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  Her clinical presentation of the bilateral temporal pain is very characteristic of temporal arteritis.  The plan is to have temporal artery biopsy tomorrow.  Today will be her third day of Solu-Medrol.     Recommendations: - Third dose of Solu-Medrol today - Continue Protonix -Temporal artery biopsy for tomorrow    07/20/2017, 9:43 AM

## 2017-07-21 ENCOUNTER — Encounter (HOSPITAL_COMMUNITY)
Admission: EM | Disposition: A | Discharge status: Home / Self Care | Payer: Self-pay | Source: Home / Self Care | Attending: Family Medicine

## 2017-07-21 ENCOUNTER — Encounter (HOSPITAL_COMMUNITY): Payer: Self-pay | Admitting: Anesthesiology

## 2017-07-21 ENCOUNTER — Inpatient Hospital Stay (HOSPITAL_COMMUNITY): Payer: Medicare Other | Admitting: Certified Registered"

## 2017-07-21 HISTORY — PX: ARTERY BIOPSY: SHX891

## 2017-07-21 LAB — POCT I-STAT 4, (NA,K, GLUC, HGB,HCT)
Glucose, Bld: 89 mg/dL (ref 65–99)
HCT: 35 % — ABNORMAL LOW (ref 36.0–46.0)
Hemoglobin: 11.9 g/dL — ABNORMAL LOW (ref 12.0–15.0)
Potassium: 3.1 mmol/L — ABNORMAL LOW (ref 3.5–5.1)
Sodium: 141 mmol/L (ref 135–145)

## 2017-07-21 LAB — MRSA PCR SCREENING: MRSA by PCR: NEGATIVE

## 2017-07-21 SURGERY — BIOPSY TEMPORAL ARTERY
Anesthesia: Monitor Anesthesia Care | Site: Head | Laterality: Bilateral

## 2017-07-21 MED ORDER — LIDOCAINE HCL (PF) 1 % IJ SOLN
INTRAMUSCULAR | Status: DC | PRN
Start: 2017-07-21 — End: 2017-07-21
  Administered 2017-07-21: 5 mL

## 2017-07-21 MED ORDER — PANTOPRAZOLE SODIUM 40 MG PO TBEC
40.0000 mg | DELAYED_RELEASE_TABLET | Freq: Every day | ORAL | Status: DC
  Administered 2017-07-22: 40 mg via ORAL
  Filled 2017-07-21: qty 1

## 2017-07-21 MED ORDER — FENTANYL CITRATE (PF) 100 MCG/2ML IJ SOLN
INTRAMUSCULAR | Status: DC | PRN
  Administered 2017-07-21 (×5): 25 ug via INTRAVENOUS
  Administered 2017-07-21: 50 ug via INTRAVENOUS
  Administered 2017-07-21: 25 ug via INTRAVENOUS

## 2017-07-21 MED ORDER — LACTATED RINGERS IV SOLN
INTRAVENOUS | Status: DC
  Administered 2017-07-21: 08:00:00 via INTRAVENOUS

## 2017-07-21 MED ORDER — PROPOFOL 10 MG/ML IV BOLUS
INTRAVENOUS | Status: DC | PRN
  Administered 2017-07-21 (×2): 20 mg via INTRAVENOUS

## 2017-07-21 MED ORDER — CEFAZOLIN SODIUM-DEXTROSE 2-3 GM-%(50ML) IV SOLR
INTRAVENOUS | Status: DC | PRN
  Administered 2017-07-21: 2 g via INTRAVENOUS

## 2017-07-21 MED ORDER — CEFAZOLIN SODIUM 1 G IJ SOLR
INTRAMUSCULAR | Status: AC
  Filled 2017-07-21: qty 10

## 2017-07-21 MED ORDER — OXYCODONE-ACETAMINOPHEN 5-325 MG PO TABS
1.0000 | ORAL_TABLET | ORAL | Status: DC | PRN
  Administered 2017-07-22: 2 via ORAL
  Filled 2017-07-21: qty 2

## 2017-07-21 MED ORDER — MIDAZOLAM HCL 5 MG/5ML IJ SOLN
INTRAMUSCULAR | Status: DC | PRN
  Administered 2017-07-21: 1 mg via INTRAVENOUS

## 2017-07-21 MED ORDER — MIDAZOLAM HCL 2 MG/2ML IJ SOLN
INTRAMUSCULAR | Status: AC
  Filled 2017-07-21: qty 2

## 2017-07-21 MED ORDER — 0.9 % SODIUM CHLORIDE (POUR BTL) OPTIME
TOPICAL | Status: DC | PRN
  Administered 2017-07-21: 1000 mL

## 2017-07-21 MED ORDER — SODIUM CHLORIDE 0.9 % IJ SOLN
INTRAMUSCULAR | Status: AC
  Filled 2017-07-21: qty 10

## 2017-07-21 MED ORDER — CEFAZOLIN SODIUM-DEXTROSE 1-4 GM/50ML-% IV SOLN
INTRAVENOUS | Status: AC
  Filled 2017-07-21: qty 50

## 2017-07-21 MED ORDER — LIDOCAINE HCL (PF) 1 % IJ SOLN
INTRAMUSCULAR | Status: AC
  Filled 2017-07-21: qty 30

## 2017-07-21 MED ORDER — FENTANYL CITRATE (PF) 100 MCG/2ML IJ SOLN: 25.0000 ug | INTRAMUSCULAR | Status: DC | PRN

## 2017-07-21 MED ORDER — FENTANYL CITRATE (PF) 250 MCG/5ML IJ SOLN
INTRAMUSCULAR | Status: AC
  Filled 2017-07-21: qty 5

## 2017-07-21 SURGICAL SUPPLY — 35 items
CANISTER SUCT 3000ML PPV (MISCELLANEOUS) ×2 IMPLANT
CONT SPEC 4OZ CLIKSEAL STRL BL (MISCELLANEOUS) ×4 IMPLANT
COTTONBALL LRG STERILE PKG (GAUZE/BANDAGES/DRESSINGS) ×2 IMPLANT
COVER SURGICAL LIGHT HANDLE (MISCELLANEOUS) ×2 IMPLANT
DECANTER SPIKE VIAL GLASS SM (MISCELLANEOUS) ×2 IMPLANT
DERMABOND ADVANCED (GAUZE/BANDAGES/DRESSINGS) ×2
DERMABOND ADVANCED .7 DNX12 (GAUZE/BANDAGES/DRESSINGS) ×2 IMPLANT
DRAPE FEMORAL ANGIO 80X135IN (DRAPES) ×2 IMPLANT
DRAPE OPHTHALMIC 77X100 STRL (CUSTOM PROCEDURE TRAY) IMPLANT
ELECT NEEDLE TIP 2.8 STRL (NEEDLE) ×2 IMPLANT
ELECT REM PT RETURN 9FT ADLT (ELECTROSURGICAL) ×2
ELECTRODE REM PT RTRN 9FT ADLT (ELECTROSURGICAL) ×1 IMPLANT
GAUZE SPONGE 2X2 8PLY STRL LF (GAUZE/BANDAGES/DRESSINGS) ×1 IMPLANT
GAUZE SPONGE 4X4 16PLY XRAY LF (GAUZE/BANDAGES/DRESSINGS) ×2 IMPLANT
GLOVE BIO SURGEON STRL SZ7.5 (GLOVE) ×2 IMPLANT
GLOVE BIOGEL PI IND STRL 8 (GLOVE) ×1 IMPLANT
GLOVE BIOGEL PI INDICATOR 8 (GLOVE) ×1
GOWN STRL REUS W/ TWL LRG LVL3 (GOWN DISPOSABLE) ×2 IMPLANT
GOWN STRL REUS W/TWL LRG LVL3 (GOWN DISPOSABLE) ×2
KIT BASIN OR (CUSTOM PROCEDURE TRAY) ×2 IMPLANT
KIT ROOM TURNOVER OR (KITS) ×2 IMPLANT
NEEDLE HYPO 25GX1X1/2 BEV (NEEDLE) ×2 IMPLANT
NS IRRIG 1000ML POUR BTL (IV SOLUTION) ×2 IMPLANT
PACK GENERAL/GYN (CUSTOM PROCEDURE TRAY) ×2 IMPLANT
PAD ARMBOARD 7.5X6 YLW CONV (MISCELLANEOUS) ×4 IMPLANT
SPONGE GAUZE 2X2 STER 10/PKG (GAUZE/BANDAGES/DRESSINGS) ×1
SUT PROLENE 6 0 BV (SUTURE) ×2 IMPLANT
SUT SILK 3 0 (SUTURE) ×1
SUT SILK 3-0 18XBRD TIE 12 (SUTURE) ×1 IMPLANT
SUT VIC AB 3-0 SH 27 (SUTURE)
SUT VIC AB 3-0 SH 27X BRD (SUTURE) IMPLANT
SUT VICRYL 4-0 PS2 18IN ABS (SUTURE) ×2 IMPLANT
SYR CONTROL 10ML LL (SYRINGE) ×2 IMPLANT
TOWEL GREEN STERILE (TOWEL DISPOSABLE) ×2 IMPLANT
WATER STERILE IRR 1000ML POUR (IV SOLUTION) IMPLANT

## 2017-07-21 NOTE — Progress Notes (Signed)
PROGRESS NOTE [facility]   [name]   SHU:[id number] DOB: March 13, 1946  DOA: 07/18/2017 PCP: [name], MD   Brief Narrative:  Crystal [name]  is 72 year old female with history of TMJ, hypertension who presented with bilateral vision loss.  Admitted with working diagnosis of temporal arteritis. Treated with IV steroids and underwent b/l temporal artery biopsy.   Subjective: Patient seen and examined, status post temporal artery biopsy.  She report some at the area of the procedure otherwise no new complaints.  Remains with no improvement on vision.  Headaches has improved slightly  Assessment & Plan: Bilateral vision loss due to suspected temporal arteritis Patient treated with high-dose Solu-Medrol for 3 doses, transitioned to prednisone 60 mg daily Underwent temporal artery biopsy bilaterally ESR elevated, but I showed bilateral optic perineuritis Will monitor overnight and continue pain control as needed. Case discussing case with patient and family suspected vision loss will not return, likelihood of recovery is very low Pathology results pending  Essential hypertension BP stable  Continue Losartan   Hyperlipidemia Continue pravastatin  Anxiety Patient started on Lexapro, continue ambien QHS prn  Follow up with PCP as outpatient   DVT prophylaxis: Lovenox Code Status: DNR Family Communication: Husband at bedside Disposition Plan: Home  Consultants:   Vascular surgery  Neurology  Procedures:   Temporal artery biopsy  Antimicrobials:  None   Objective: Vitals:   07/21/17 0915 07/21/17 0930 07/21/17 0945 07/21/17 1424  BP: (!) 137/48 104/68 106/87 (!) 131/53  Pulse: 68 65 69 (!) 59  Resp: 11 17 11 18   Temp: (!) 97.2 F (36.2 C)  (!) 97.5 F (36.4 C) 98.3 F (36.8 C)  TempSrc:    Oral  SpO2: 95% 97% 95% 91%  Weight:      Height:        Intake/Output Summary (Last 24 hours) at 07/21/2017 1450 Last data filed at 07/21/2017 0946 Gross per  24 hour  Intake 780 ml  Output 5 ml  Net 775 ml   Filed Weights   07/18/17 0519 07/21/17 0752  Weight: 53.5 kg (118 lb) 53.5 kg (118 lb)    Examination:  General exam: Appears calm and comfortable  HEENT: Pupils not reactive, incisions in bilateral temple intact Respiratory system: Clear to auscultation. No wheezes,crackle or rhonchi Cardiovascular system: S1 & S2 heard, RRR. No JVD, murmurs, rubs or gallops Gastrointestinal system: Abdomen is nondistended, soft and nontender.  Central nervous system: Alert and oriented. No focal neurological deficits. Extremities: No pedal edema.  Skin: No rashes, lesions or ulcers  Data Reviewed: I have personally reviewed following labs and imaging studies  CBC: Recent Labs  Lab 07/18/17 0542 07/19/17 0527 07/21/17 0755  WBC 13.1* 6.1  --   NEUTROABS 9.8*  --   --   HGB 12.7 11.7* 11.9*  HCT 40.3 36.8 35.0*  MCV 102.3* 99.5  --   PLT 474* 451*  --    Basic Metabolic Panel: Recent Labs  Lab 07/18/17 0542 07/19/17 0527 07/21/17 0755  NA 139 137 141  K 3.8 3.7 3.1*  CL 101 101  --   CO2 23 22  --   GLUCOSE 86 129* 89  BUN 8 10  --   CREATININE 0.60 0.52  --   CALCIUM 9.4 9.1  --    GFR: Estimated Creatinine Clearance: 52.9 mL/min (by C-G formula based on SCr of 0.52 mg/dL). Liver Function Tests: No results for input(s): AST, ALT, ALKPHOS, BILITOT, PROT, ALBUMIN in the  last 168 hours. No results for input(s): LIPASE, AMYLASE in the last 168 hours. No results for input(s): AMMONIA in the last 168 hours. Coagulation Profile: No results for input(s): INR, PROTIME in the last 168 hours. Cardiac Enzymes: No results for input(s): CKTOTAL, CKMB, CKMBINDEX, TROPONINI in the last 168 hours. BNP (last 3 results) No results for input(s): PROBNP in the last 8760 hours. HbA1C: No results for input(s): HGBA1C in the last 72 hours. CBG: No results for input(s): GLUCAP in the last 168 hours. Lipid Profile: No results for input(s):  CHOL, HDL, LDLCALC, TRIG, CHOLHDL, LDLDIRECT in the last 72 hours. Thyroid Function Tests: No results for input(s): TSH, T4TOTAL, FREET4, T3FREE, THYROIDAB in the last 72 hours. Anemia Panel: No results for input(s): VITAMINB12, FOLATE, FERRITIN, TIBC, IRON, RETICCTPCT in the last 72 hours. Sepsis Labs: No results for input(s): PROCALCITON, LATICACIDVEN in the last 168 hours.  Recent Results (from the past 240 hour(s))  MRSA PCR Screening     Status: None   Collection Time: 07/21/17  3:14 AM  Result Value Ref Range Status   MRSA by PCR NEGATIVE NEGATIVE Final    Comment:        The GeneXpert MRSA Assay (FDA approved for NASAL specimens only), is one component of a comprehensive MRSA colonization surveillance program. It is not intended to diagnose MRSA infection nor to guide or monitor treatment for MRSA infections. Performed at [facility], [place] [address] [place], [place] [address]       Radiology Studies: No results found.    Scheduled Meds: . docusate sodium  100 mg Oral BID  . enoxaparin (LOVENOX) injection  40 mg Subcutaneous Q24H  . escitalopram  10 mg Oral Daily  . furosemide  40 mg Oral Daily  . losartan  25 mg Oral Daily  . mometasone-formoterol  2 puff Inhalation BID  . montelukast  10 mg Oral QPM  . [START ON 07/22/2017] pantoprazole  40 mg Oral Q1200  . pravastatin  40 mg Oral q1800  . prednisoLONE  60 mg Oral QAC breakfast  . theophylline  200 mg Oral BID   Continuous Infusions: . ceFAZolin    . lactated ringers 10 mL/hr at 07/21/17 0753     LOS: 3 days    Time spent: Total of 15 minutes spent with pt, greater than 50% of which was spent in discussion of  treatment, counseling and coordination of care  Chipper Oman, MD Pager: Text Page via [email or web address]   If 7PM-7AM, please contact night-coverage [email or web address] 07/21/2017, 2:50 PM

## 2017-07-21 NOTE — Anesthesia Postprocedure Evaluation (Signed)
Anesthesia Post Note  Patient: Crystal [name]  Procedure(s) Performed: BIOPSY BILATERAL TEMPORAL ARTERY (Bilateral Head)     Patient location during evaluation: Endoscopy Anesthesia Type: MAC Level of consciousness: awake Pain management: pain level controlled Vital Signs Assessment: post-procedure vital signs reviewed and stable Respiratory status: spontaneous breathing Cardiovascular status: stable Anesthetic complications: no    Last Vitals:  Vitals:   07/21/17 0930 07/21/17 0945  BP: 104/68 106/87  Pulse: 65 69  Resp: 17 11  Temp:  (!) 36.4 C  SpO2: 97% 95%    Last Pain:  Vitals:   07/21/17 1027  TempSrc:   PainSc: 6                  Ady Heimann

## 2017-07-21 NOTE — Interval H&P Note (Signed)
History and Physical Interval Note:  07/21/2017 7:22 AM  Crystal [name]  has presented today for surgery, with the diagnosis of visual disturbance  The various methods of treatment have been discussed with the patient and family. After consideration of risks, benefits and other options for treatment, the patient has consented to  Procedure(s): BIOPSY TEMPORAL ARTERY (Bilateral) as a surgical intervention .  The patient's history has been reviewed, patient examined, no change in status, stable for surgery.  I have reviewed the patient's chart and labs.  Questions were answered to the patient's satisfaction.     [name]

## 2017-07-21 NOTE — Transfer of Care (Signed)
Immediate Anesthesia Transfer of Care Note  Patient: Crystal [name]  Procedure(s) Performed: BIOPSY BILATERAL TEMPORAL ARTERY (Bilateral Head)  Patient Location: PACU  Anesthesia Type:MAC  Level of Consciousness: awake and alert   Airway & Oxygen Therapy: Patient Spontanous Breathing and Patient connected to face mask oxygen  Post-op Assessment: Report given to RN and Post -op Vital signs reviewed and stable  Post vital signs: Reviewed and stable  Last Vitals:  Vitals:   07/21/17 0122 07/21/17 0528  BP: (!) 126/59 (!) 140/55  Pulse: 61 (!) 58  Resp: 18 18  Temp: 36.8 C 36.6 C  SpO2: 96% 95%    Last Pain:  Vitals:   07/21/17 0528  TempSrc: Oral  PainSc:       Patients Stated Pain Goal: 0 (34/19/62 2297)  Complications: No apparent anesthesia complications

## 2017-07-21 NOTE — Anesthesia Procedure Notes (Signed)
Procedure Name: MAC Date/Time: 07/21/2017 8:10 AM Performed by: Lieutenant Diego, CRNA Pre-anesthesia Checklist: Patient identified, Emergency Drugs available, Suction available, Patient being monitored and Timeout performed Oxygen Delivery Method: Simple face mask Preoxygenation: Pre-oxygenation with 100% oxygen

## 2017-07-21 NOTE — Progress Notes (Signed)
No improvement in her vision.  At this point, I think that she has likely lost vision.  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  Biopsy is pending, I do not think that she needs to stay in the hospital pending these results.  She will need to be maintained on prednisone 60 mg daily.  I have requested outpatient follow-up, no further recommendations at this time, please call with further questions or concerns.  Roland Rack, MD [facility] [phone]  If 7pm- 7am, please page neurology on call as listed in [place].

## 2017-07-21 NOTE — Op Note (Signed)
    NAME: Crystal [name]    MRN: [id number] DOB: December 06, 1945    DATE OF OPERATION: 07/21/2017  PREOP DIAGNOSIS:    Headaches and bilateral blindness  POSTOP DIAGNOSIS:    Same  PROCEDURE:    Bilateral temporal artery biopsies  SURGEON: Judeth Cornfield. Scot Dock, MD, FACS  ASSIST: None  ANESTHESIA: Local with sedation  EBL: Minimal  INDICATIONS:    Crystal [name] is a 72 y.o. female who presented with headaches and had developed bilateral blindness.  She was being ruled out for temporal arteritis.  She presents for bilateral temporal artery biopsy.  FINDINGS:   Segment of both temporal arteries was sent to pathology for permanent section.  TECHNIQUE:   The patient was taken to the operating room and sedated by it.  The temporal artery pulse was identified on each side and some hair was shaved to allow access to this area.  These 2 areas were then prepped and draped in usual sterile fashion.  On the right side, the skin was anesthetized with 1% lidocaine.  The incision was made over the temporal artery.  This was dissected free over a length of approximately 3 cm.  The artery was then ligated proximally and distally and excised and sent to pathology.  Hemostasis was obtained of the wound.  The wound was then closed with a deep layer of 3-0 Vicryl and the skin closed with 4-0 Vicryl.  On the left side, the skin was anesthetized over the temporal artery.  An incision was made over the temporal artery.  This was dissected free over a length of approximately 3 cm and this was ligated proximally and distally.  The specimen was sent to pathology.  Hemostasis was obtained in the wound.  The wound was closed with 4-0 Vicryl.  Dermabond was placed on both incisions.  The patient tolerated the procedure well was transferred to the recovery room in stable condition.  All needle and sponge counts were correct.  The  [name], MD, FACS [facility]  DATE OF  DICTATION:   07/21/2017

## 2017-07-21 NOTE — Anesthesia Preprocedure Evaluation (Signed)
Anesthesia Evaluation  Patient identified by MRN, date of birth, ID band Patient awake    Reviewed: Allergy & Precautions, NPO status , Patient's Chart, lab work & pertinent test results  Airway Mallampati: II  TM Distance: >3 FB     Dental   Pulmonary asthma , former smoker,    breath sounds clear to auscultation       Cardiovascular hypertension, + Peripheral Vascular Disease   Rhythm:Regular Rate:Normal     Neuro/Psych    GI/Hepatic negative GI ROS, Neg liver ROS,   Endo/Other  negative endocrine ROS  Renal/GU Renal disease     Musculoskeletal   Abdominal   Peds  Hematology   Anesthesia Other Findings   Reproductive/Obstetrics                             Anesthesia Physical Anesthesia Plan  ASA: III  Anesthesia Plan: MAC   Post-op Pain Management:    Induction: Intravenous  PONV Risk Score and Plan: 2 and Treatment may vary due to age or medical condition  Airway Management Planned: Simple Face Mask and Nasal Cannula  Additional Equipment:   Intra-op Plan:   Post-operative Plan:   Informed Consent: I have reviewed the patients History and Physical, chart, labs and discussed the procedure including the risks, benefits and alternatives for the proposed anesthesia with the patient or authorized representative who has indicated his/her understanding and acceptance.   Dental advisory given  Plan Discussed with: CRNA and Anesthesiologist  Anesthesia Plan Comments:         Anesthesia Quick Evaluation

## 2017-07-22 ENCOUNTER — Encounter (HOSPITAL_COMMUNITY): Payer: Self-pay | Admitting: Vascular Surgery

## 2017-07-22 MED ORDER — ESCITALOPRAM OXALATE 10 MG PO TABS: 10.0000 mg | ORAL_TABLET | Freq: Every day | ORAL | 0 refills | Status: DC

## 2017-07-22 MED ORDER — OXYCODONE-ACETAMINOPHEN 5-325 MG PO TABS: 1.0000 | ORAL_TABLET | Freq: Four times a day (QID) | ORAL | 0 refills | Status: DC | PRN

## 2017-07-22 MED ORDER — PREDNISONE 20 MG PO TABS: 60.0000 mg | ORAL_TABLET | Freq: Every day | ORAL | 0 refills | Status: DC

## 2017-07-22 MED ORDER — POTASSIUM CHLORIDE CRYS ER 20 MEQ PO TBCR
40.0000 meq | EXTENDED_RELEASE_TABLET | Freq: Once | ORAL | Status: AC
  Administered 2017-07-22: 40 meq via ORAL
  Filled 2017-07-22: qty 2

## 2017-07-22 NOTE — Care Management Important Message (Signed)
Important Message  Patient Details  Name: Crystal [name] MRN: [id number] Date of Birth: 10/01/1945   Medicare Important Message Given:  Yes    Orbie Pyo 07/22/2017, 12:26 PM

## 2017-07-22 NOTE — Progress Notes (Signed)
   VASCULAR SURGERY ASSESSMENT & PLAN:   1 Day Post-Op s/p: Bilateral temporal artery biopsy.  Her incisions look fine.  Her pathology is pending.  Vascular surgery will be available as needed.  SUBJECTIVE:   No complaints this morning.   PHYSICAL EXAM:   Vitals:   07/21/17 2147 07/22/17 0112 07/22/17 0517 07/22/17 0801  BP: (!) 130/51 (!) 122/8 (!) 127/59   Pulse: (!) 51 (!) 57 66   Resp: 18 18 18    Temp: 98 F (36.7 C) 98.5 F (36.9 C) 97.6 F (36.4 C)   TempSrc: Oral Oral Oral   SpO2: 90% 92% 91% 95%  Weight:      Height:       Her incisions look fine.  LABS:   Lab Results  Component Value Date   WBC 6.1 07/19/2017   HGB 11.9 (L) 07/21/2017   HCT 35.0 (L) 07/21/2017   MCV 99.5 07/19/2017   PLT 451 (H) 07/19/2017   Lab Results  Component Value Date   CREATININE 0.52 07/19/2017   No results found for: INR, PROTIME CBG (last 3)  No results for input(s): GLUCAP in the last 72 hours.  PROBLEM LIST:    Principal Problem:   Vision, loss, sudden, bilateral Active Problems:   Hyperlipidemia   Essential hypertension   TMJ arthralgia   Temporal arteritis (HCC)   CURRENT MEDS:   . docusate sodium  100 mg Oral BID  . enoxaparin (LOVENOX) injection  40 mg Subcutaneous Q24H  . escitalopram  10 mg Oral Daily  . furosemide  40 mg Oral Daily  . losartan  25 mg Oral Daily  . mometasone-formoterol  2 puff Inhalation BID  . montelukast  10 mg Oral QPM  . pantoprazole  40 mg Oral Q1200  . pravastatin  40 mg Oral q1800  . prednisoLONE  60 mg Oral QAC breakfast  . theophylline  200 mg Oral BID    Crystal [name] Beeper: [phone] Office: [phone] 07/22/2017

## 2017-07-22 NOTE — Discharge Summary (Signed)
Physician Discharge Summary  Crystal [name]  ZHY:[id number]  DOB: [birth year]  DOA: 07/18/2017 PCP: [name], MD  Admit date: 07/18/2017 Discharge date: 07/22/2017  Admitted From: Home  Disposition:  Home   Recommendations for Outpatient Follow-up:  1. Follow up with PCP in 1-2 weeks 2. Please obtain BMP/CBC in one week 3. Follow up with Neurology  4. Please follow up on the following pending results: Pathology pending from temporal artery biopsy   Discharge Condition: Stable  CODE STATUS: DNR  Diet recommendation: Heart Healthy   Brief/Interim Summary: For full details see H&P/Progress note, but in brief, Crystal [name] is a 72 year old female with history of TMJ, hypertension who presented with bilateral vision loss. Admitted with working diagnosis of temporal arteritis. Treated with IV steroids and underwent b/l temporal artery biopsy.   Subjective: Patient seen and examined, no changes in vision. Still c/o headaches on and off. No acute events overnight.    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 Patient treated with high-dose Solu-Medrol for 3 doses, transitioned to prednisone 60 mg daily Underwent temporal artery biopsy bilaterally - pathology pending  ESR elevated, but I showed bilateral optic perineuritis Pain is well controlled with oxycodone - will give prescription for 3 days  Case discussing case with patient and family suspected vision loss will not return, likelihood of recovery is very low Follow up with neurology   Essential hypertension BP stable during hospital stay  Continue Losartan with no changes   Hyperlipidemia Continue pravastatin  Anxiety Patient started on Lexapro, continue ambien QHS prn  Follow up with PCP as outpatient  Hypokalemia  Repleted Check BMP in 1 week   All other chronic medical condition were stable during the hospitalization.  On the day of the discharge the patient's  vitals were stable, and no other acute medical condition were reported by patient. the patient was felt safe to be discharge to home  Discharge Instructions  You were cared for by a hospitalist during your hospital stay. If you have any questions about your discharge medications or the care you received while you were in the hospital after you are discharged, you can call the unit and asked to speak with the hospitalist on call if the hospitalist that took care of you is not available. Once you are discharged, your primary care physician will handle any further medical issues. Please note that NO REFILLS for any discharge medications will be authorized once you are discharged, as it is imperative that you return to your primary care physician (or establish a relationship with a primary care physician if you do not have one) for your aftercare needs so that they can reassess your need for medications and monitor your lab values.  Discharge Instructions    Ambulatory referral to Neurology   Complete by:  As directed    An appointment is requested in approximately: 2 weeks   Call MD for:  difficulty breathing, headache or visual disturbances   Complete by:  As directed    Call MD for:  extreme fatigue   Complete by:  As directed    Call MD for:  hives   Complete by:  As directed    Call MD for:  persistant dizziness or light-headedness   Complete by:  As directed    Call MD for:  persistant nausea and vomiting   Complete by:  As directed    Call MD for:  redness, tenderness, or signs of  infection (pain, swelling, redness, odor or green/yellow discharge around incision site)   Complete by:  As directed    Call MD for:  severe uncontrolled pain   Complete by:  As directed    Call MD for:  temperature >100.4   Complete by:  As directed    Diet - low sodium heart healthy   Complete by:  As directed    Increase activity slowly   Complete by:  As directed      Allergies as of 07/22/2017       Reactions   Simvastatin    myalgias   Prednisone Hives      Medication List    STOP taking these medications   CELEBREX 200 MG capsule Generic drug:  celecoxib     TAKE these medications   acetaminophen 500 MG tablet Commonly known as:  TYLENOL Take 1,000 mg by mouth every 6 (six) hours as needed for headache.   ALIGN 4 MG Caps Take 4 mg by mouth daily.   ALPRAZolam 0.25 MG tablet Commonly known as:  XANAX Take 0.25 mg by mouth at bedtime as needed for anxiety.   aspirin 325 MG tablet Take 325 mg by mouth as needed for mild pain.   CALCIUM 600/VITAMIN D PO Take 1 tablet by mouth daily.   diclofenac sodium 1 % Gel Commonly known as:  VOLTAREN Apply topically at bedtime.   escitalopram 10 MG tablet Commonly known as:  LEXAPRO Take 1 tablet (10 mg total) by mouth daily. Start taking on:  07/23/2017   Estradiol 10 MCG Tabs vaginal tablet Insert 1 tablet (20mg) twice a week by vaginal route.   furosemide 40 MG tablet Commonly known as:  LASIX TAKE ONE TABLET BY MOUTH DAILY What changed:    how much to take  how to take this  when to take this   losartan 50 MG tablet Commonly known as:  COZAAR TAKE 0.5 TABLETS (25 MG TOTAL) BY MOUTH DAILY.   montelukast 10 MG tablet Commonly known as:  SINGULAIR TAKE ONE TABLET BY MOUTH DAILY What changed:    how much to take  how to take this  when to take this   oxyCODONE-acetaminophen 5-325 MG tablet Commonly known as:  PERCOCET/ROXICET Take 1 tablet by mouth every 6 (six) hours as needed for severe pain.   pravastatin 40 MG tablet Commonly known as:  PRAVACHOL TAKE 1 TABLET DAILY BY MOUTH AT BEDTIME What changed:  See the new instructions.   predniSONE 20 MG tablet Commonly known as:  DELTASONE Take 3 tablets (60 mg total) by mouth daily.   SYMBICORT 160-4.5 MCG/ACT inhaler Generic drug:  budesonide-formoterol INHALE 1 TO 2 PUFFS BY MOUTH EVERY 12 HOURS THEN GARGLE AND SPIT AFTER USE   theophylline  400 MG 24 hr tablet Commonly known as:  UNIPHYL TAKE 1 TABLET (400 MG TOTAL) BY MOUTH DAILY. What changed:    how much to take  when to take this   trimethoprim 100 MG tablet Commonly known as:  TRIMPEX Take 100 mg by mouth as needed (bladder problems during travel).   VENTOLIN HFA 108 (90 Base) MCG/ACT inhaler Generic drug:  albuterol Inhale 1-2 puffs into the lungs every 6 (six) hours as needed for wheezing or shortness of breath.   Vitamin D3 1000 units Caps Take 3,000 Units by mouth daily with lunch.      Follow-up Information    [facility]. Schedule an appointment as soon as possible for a visit in  3 week(s).   Why:  Call this office to see who can give you earliest appointment  Contact information: [place], [place] [phone]       [facility]. Schedule an appointment as soon as possible for a visit in 3 week(s).   Specialty:  Neurology Why:  Call this office to see who can give you earliest appointment  Contact information: [address] [place]       [name], MD. Schedule an appointment as soon as possible for a visit in 1 week(s).   Specialty:  Internal Medicine Why:  Hospital follow up  Contact information: [address] [place] [address] [phone]          Allergies  Allergen Reactions  . Simvastatin     myalgias  . Prednisone Hives    Consultations:  Neurology   Vascualar surgery    Procedures/Studies: Mr Jeri Cos And Wo Contrast  Result Date: 07/18/2017 CLINICAL DATA:  Visual loss or uveitis/scleritis. Three days ago there was blurry vision in the left eye. Two days ago there was complete vision loss in the left eye. Now partial vision loss in the right eye. EXAM: MRI HEAD AND ORBITS WITHOUT AND WITH CONTRAST TECHNIQUE: Multiplanar, multiecho pulse sequences of the brain and surrounding structures  were obtained without and with intravenous contrast. Multiplanar, multiecho pulse sequences of the orbits and surrounding structures were obtained including fat saturation techniques, before and after intravenous contrast administration. CONTRAST:  33m MULTIHANCE GADOBENATE DIMEGLUMINE 529 MG/ML IV SOLN COMPARISON:  11/27/2007 head CT FINDINGS: MRI HEAD FINDINGS Brain: No acute infarction, hemorrhage, hydrocephalus, extra-axial collection or mass lesion. Few signal abnormalities mainly in the cerebral white matter, essentially age congruent. No specific demyelinating pattern. Vascular: Major flow voids and vascular enhancements are preserved Skull and upper cervical spine: No evidence of marrow lesion. C3-4 facet arthropathy on the left with mild anterolisthesis. MRI ORBITS FINDINGS Orbits: Status post bilateral cataract resection. Suspect subtle mounding of the optic disc on the left. The optic nerves themselves have symmetric normal signal, but there is perineural strandy appearance on precontrast T1 weighted imaging that enhances. There is no focal masslike area. The superior ophthalmic veins have symmetric signal and enhancement. Negative cavernous sinus region. Normal appearance of the extraocular muscles and lacrimal glands. Visualized sinuses: Clear Soft tissues: Negative IMPRESSION: Orbit MRI: Bilateral optic perineuritis. In addition to idiopathic inflammation this pattern has also been described with giant cell arteritis (reported history of headache and jaw claudication). Brain MRI: No acute finding. No evidence of elevated intracranial pressure or demyelinating disease. Electronically Signed   By: JMonte FantasiaM.D.   On: 07/18/2017 08:45   Mr ORosealee AlbeeWWCContrast  Result Date: 07/18/2017 CLINICAL DATA:  Visual loss or uveitis/scleritis. Three days ago there was blurry vision in the left eye. Two days ago there was complete vision loss in the left eye. Now partial vision loss in the right eye.  EXAM: MRI HEAD AND ORBITS WITHOUT AND WITH CONTRAST TECHNIQUE: Multiplanar, multiecho pulse sequences of the brain and surrounding structures were obtained without and with intravenous contrast. Multiplanar, multiecho pulse sequences of the orbits and surrounding structures were obtained including fat saturation techniques, before and after intravenous contrast administration. CONTRAST:  162mMULTIHANCE GADOBENATE DIMEGLUMINE 529 MG/ML IV SOLN COMPARISON:  11/27/2007 head CT FINDINGS: MRI HEAD FINDINGS Brain: No acute infarction, hemorrhage, hydrocephalus, extra-axial collection or mass lesion. Few  signal abnormalities mainly in the cerebral white matter, essentially age congruent. No specific demyelinating pattern. Vascular: Major flow voids and vascular enhancements are preserved Skull and upper cervical spine: No evidence of marrow lesion. C3-4 facet arthropathy on the left with mild anterolisthesis. MRI ORBITS FINDINGS Orbits: Status post bilateral cataract resection. Suspect subtle mounding of the optic disc on the left. The optic nerves themselves have symmetric normal signal, but there is perineural strandy appearance on precontrast T1 weighted imaging that enhances. There is no focal masslike area. The superior ophthalmic veins have symmetric signal and enhancement. Negative cavernous sinus region. Normal appearance of the extraocular muscles and lacrimal glands. Visualized sinuses: Clear Soft tissues: Negative IMPRESSION: Orbit MRI: Bilateral optic perineuritis. In addition to idiopathic inflammation this pattern has also been described with giant cell arteritis (reported history of headache and jaw claudication). Brain MRI: No acute finding. No evidence of elevated intracranial pressure or demyelinating disease. Electronically Signed   By: Monte Fantasia M.D.   On: 07/18/2017 08:45    Discharge Exam: Vitals:   07/22/17 0801 07/22/17 1126  BP:  (!) 105/40  Pulse:  66  Resp:  16  Temp:  97.8 F  (36.6 C)  SpO2: 95% 97%   Vitals:   07/22/17 0112 07/22/17 0517 07/22/17 0801 07/22/17 1126  BP: (!) 122/8 (!) 127/59  (!) 105/40  Pulse: (!) 57 66  66  Resp: _0 Temp: 98.5 F (36.9 C) 97.6 F (36.4 C)  97.8 F (36.6 C)  TempSrc: Oral Oral  Oral  SpO2: 92% 91% 95% 97%  Weight:      Height:        General: Pt is alert, awake, not in acute distress HEENT: Pupils not reactive, Mild tenderness with ocular movements. Temple incision intact Cardiovascular: RRR, S1/S2 +, no rubs, no gallops Respiratory: CTA bilaterally, no wheezing, no rhonchi Abdominal: Soft, NT, ND, bowel sounds + Extremities: no edema  The results of significant diagnostics from this hospitalization (including imaging, microbiology, ancillary and laboratory) are listed below for reference.     Microbiology: Recent Results (from the past 240 hour(s))  MRSA PCR Screening     Status: None   Collection Time: 07/21/17  3:14 AM  Result Value Ref Range Status   MRSA by PCR NEGATIVE NEGATIVE Final    Comment:        The GeneXpert MRSA Assay (FDA approved for NASAL specimens only), is one component of a comprehensive MRSA colonization surveillance program. It is not intended to diagnose MRSA infection nor to guide or monitor treatment for MRSA infections. Performed at [facility], [place] [address] [place], [place] [address]      Labs: BNP (last 3 results) No results for input(s): BNP in the last 8760 hours. Basic Metabolic Panel: Recent Labs  Lab 07/18/17 0542 07/19/17 0527 07/21/17 0755  NA 139 137 141  K 3.8 3.7 3.1*  CL 101 101  --   CO2 23 22  --   GLUCOSE 86 129* 89  BUN 8 10  --   CREATININE 0.60 0.52  --   CALCIUM 9.4 9.1  --    Liver Function Tests: No results for input(s): AST, ALT, ALKPHOS, BILITOT, PROT, ALBUMIN in the last 168 hours. No results for input(s): LIPASE, AMYLASE in the last 168 hours. No results for input(s): AMMONIA in the last 168 hours. CBC: Recent  Labs  Lab 07/18/17 0542 07/19/17 0527 07/21/17 0755  WBC 13.1* 6.1  --  NEUTROABS 9.8*  --   --   HGB 12.7 11.7* 11.9*  HCT 40.3 36.8 35.0*  MCV 102.3* 99.5  --   PLT 474* 451*  --    Cardiac Enzymes: No results for input(s): CKTOTAL, CKMB, CKMBINDEX, TROPONINI in the last 168 hours. BNP: Invalid input(s): POCBNP CBG: No results for input(s): GLUCAP in the last 168 hours. D-Dimer No results for input(s): DDIMER in the last 72 hours. Hgb A1c No results for input(s): HGBA1C in the last 72 hours. Lipid Profile No results for input(s): CHOL, HDL, LDLCALC, TRIG, CHOLHDL, LDLDIRECT in the last 72 hours. Thyroid function studies No results for input(s): TSH, T4TOTAL, T3FREE, THYROIDAB in the last 72 hours.  Invalid input(s): FREET3 Anemia work up No results for input(s): VITAMINB12, FOLATE, FERRITIN, TIBC, IRON, RETICCTPCT in the last 72 hours. Urinalysis No results found for: COLORURINE, APPEARANCEUR, [place], [place], [place], [place], [place], [place], PROTEINUR, UROBILINOGEN, NITRITE, LEUKOCYTESUR Sepsis Labs Invalid input(s): PROCALCITONIN,  WBC,  LACTICIDVEN Microbiology Recent Results (from the past 240 hour(s))  MRSA PCR Screening     Status: None   Collection Time: 07/21/17  3:14 AM  Result Value Ref Range Status   MRSA by PCR NEGATIVE NEGATIVE Final    Comment:        The GeneXpert MRSA Assay (FDA approved for NASAL specimens only), is one component of a comprehensive MRSA colonization surveillance program. It is not intended to diagnose MRSA infection nor to guide or monitor treatment for MRSA infections. Performed at [facility], [place] [address], [place], [place] [address]      Time coordinating discharge:  32 minutes  SIGNED:  Chipper Oman, MD  [facility] 07/22/2017, 1:24 PM  Pager please text page via  [email or web address]

## 2017-07-22 NOTE — Care Management Note (Signed)
Case Management Note  Patient Details  Name: AVRIL BUSSER MRN: [id number] Date of Birth: 11/04/1945  Subjective/Objective:                    Action/Plan: Pt discharging home with self care.  PCP: Dr [name] Insurance: [employer] medicare Pt has transportation home.  No further needs per CM.   Expected Discharge Date:  07/22/17               Expected Discharge Plan:  Home/Self Care  In-House Referral:     Discharge planning Services     Post Acute Care Choice:    Choice offered to:     DME Arranged:    DME Agency:     HH Arranged:    HH Agency:     Status of Service:  Completed, signed off  If discussed at [employer] of Stay Meetings, dates discussed:    Additional Comments:  Pollie Friar, RN 07/22/2017, 2:09 PM

## 2017-07-22 NOTE — Progress Notes (Signed)
Patient discharged home. Discharge instructions were reviewed with the patient. Patient Verbalized understanding.  

## 2017-07-23 NOTE — Progress Notes (Signed)
Subjective:    Patient ID: Crystal [name], female    DOB: 1945-11-04, 72 y.o.   MRN: [id number]  HPI The patient is here for follow up from the hospital.  Admitted 07/18/17 - 07/22/17  Recommendations for Outpatient Follow-up:  1. Follow up with PCP in 1-2 weeks 2. Please obtain BMP/CBC in one week 3. 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  She has had some headaches and thought they were coming from the TMJ.  Her headaches are frontal, occipital with neck tension.  The day before admission she took a half tablet of flexeril.  Her vision in her left eye was a little blurry.  The next day her headache was worse and she had significant vision loss in the left eye.  She met the eye doctor in his office (she thought it was related to her prior cataract surgery) and the optic nerve looked infected so she was sent to the ED.  She was in the ED at 2pm and was not going to be seen until several hours and left.  The next morning she had an appointment with me, but she woke up with right eye symptoms.  She called her eye doctor and he advised her to go to the ED.  She had total vision loss from midline down in the right eye.  She was using a mouthguard and was only able to eat soft foods.  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 Temporal arteritis.  CRP elevated, ESR mildly elevated.  Neurology was consulted for probable temporal arteritis.  She received 1 gm IV Solumedrol.  She had prn morphine for the pain.  IR was consulted for temporal artery biopsies, which she had done on 07/21/17.  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: MRI showed bilateral optic perineuritis, ESR and CRP elevated B/l temporal artery biopsies show active temporal arteritis Received IV solu-medrol x 3 doses Discharged home on prednisone 60 mg daily Given three day  prescription for oxycodone Suspected -  vision loss will not return Follow up with neurology-referral placed today We will also refer for neuro ophthalmology at [facility] and rheumatology Within the next 2 weeks hopefully she will be able to get in to see 1 of the specialist who can help manage her steroids, but expect to decrease prednisone after 2 weeks-she will call me if she is not seeing a specialist by that time Significant headache related to temporal arteritis-oxycodone as needed.-Pain level 8/10-10/10.  Right-sided head and neck.  Prescription refilled today-continue oxycodone as needed Check CBC, CMP  Hypertension: Controlled in the hospital, but very elevated today, which she feels is related to anxiety and pain Continued on losartan  Hyperlipidemia: Stable Continued on pravastatin  Anxiety Started on lexapro in the hospital She also received Xanax to help her sleep-we will prescribe Ativan as needed at nighttime given the heightened anxiety and difficulty sleeping  Hypokalemia repleted Recheck cmp in today     Medications and allergies reviewed with patient and updated if appropriate.  Patient Active Problem List   Diagnosis Date Noted  . Temporal arteritis ([place]) 07/18/2017  . Vision, loss, sudden, bilateral 07/18/2017  . 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 Parotitis 10/06/2016  . Osteoarthritis 06/25/2016  . Anxiety 06/25/2016  . Back pain 07/18/2015  . 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 Osteopenia 06/03/2008  .  History of colonic polyps 06/03/2008  . INTENTION TREMOR 12/06/2007  . IBS 10/16/2007  . Hyperlipidemia 06/06/2007  . Essential hypertension 06/06/2007  . Chronic obstructive airway disease with asthma ([place]) 02/23/2007    Current Outpatient Medications on File Prior to Visit  Medication Sig Dispense Refill  . acetaminophen (TYLENOL) 500 MG tablet Take 1,000 mg by mouth every 6 (six) hours as needed for headache.    .  ALPRAZolam (XANAX) 0.25 MG tablet Take 0.25 mg by mouth at bedtime as needed for anxiety.    Marland Kitchen aspirin 325 MG tablet Take 325 mg by mouth as needed for mild pain.    . Calcium Carbonate-Vitamin D (CALCIUM 600/VITAMIN D PO) Take 1 tablet by mouth daily.     . Cholecalciferol (VITAMIN D3) 1000 UNITS CAPS Take 3,000 Units by mouth daily with lunch.     . diclofenac sodium (VOLTAREN) 1 % GEL Apply topically at bedtime.    Marland Kitchen escitalopram (LEXAPRO) 10 MG tablet Take 1 tablet (10 mg total) by mouth daily. 30 tablet 0  . Estradiol 10 MCG TABS vaginal tablet Insert 1 tablet (67mg) twice a week by vaginal route.    . furosemide (LASIX) 40 MG tablet TAKE ONE TABLET BY MOUTH DAILY (Patient taking differently: TAKE 457mMOUTH once DAILY) 90 tablet 0  . losartan (COZAAR) 50 MG tablet TAKE 0.5 TABLETS (25 MG TOTAL) BY MOUTH DAILY. 45 tablet 1  . montelukast (SINGULAIR) 10 MG tablet TAKE ONE TABLET BY MOUTH DAILY (Patient taking differently: TAKE ONE TABLET BY MOUTH in the evening) 90 tablet 1  . oxyCODONE-acetaminophen (PERCOCET/ROXICET) 5-325 MG tablet Take 1 tablet by mouth every 6 (six) hours as needed for severe pain. 12 tablet 0  . pravastatin (PRAVACHOL) 40 MG tablet TAKE 1 TABLET DAILY BY MOUTH AT BEDTIME (Patient taking differently: TAKE 4061mY MOUTH AT BEDTIME) 90 tablet 1  . predniSONE (DELTASONE) 20 MG tablet Take 3 tablets (60 mg total) by mouth daily. 90 tablet 0  . Probiotic Product (ALIGN) 4 MG CAPS Take 4 mg by mouth daily.     . SYMBICORT 160-4.5 MCG/ACT inhaler INHALE 1 TO 2 PUFFS BY MOUTH EVERY 12 HOURS THEN GARGLE AND SPIT AFTER USE 10.2 g 11  . theophylline (UNIPHYL) 400 MG 24 hr tablet TAKE 1 TABLET (400 MG TOTAL) BY MOUTH DAILY. (Patient taking differently: Take 200 mg by mouth 2 (two) times daily. ) 90 tablet 3  . trimethoprim (TRIMPEX) 100 MG tablet Take 100 mg by mouth as needed (bladder problems during travel).     . VENTOLIN HFA 108 (90 Base) MCG/ACT inhaler Inhale 1-2 puffs into the  lungs every 6 (six) hours as needed for wheezing or shortness of breath. 1 Inhaler 8   No current facility-administered medications on file prior to visit.     Past Medical History:  Diagnosis Date  . Adenomatous colon polyp 2007 & 2010    Dr StaFuller Plan Allergy    seasonal  . Arthritis   . Cataract   . Diverticulosis   . Hyperlipidemia   . Hypertension   . IBS (irritable bowel syndrome)   . MVP (mitral valve prolapse)   . [place] RAD (reactive airway disease)     Past Surgical History:  Procedure Laterality Date  . ABDOMINAL HYSTERECTOMY     with USO for dysfunctionall menses  . APPENDECTOMY    . ARTERY BIOPSY Bilateral 07/21/2017   Procedure: BIOPSY BILATERAL TEMPORAL ARTERY;  Surgeon:  [name], MD;  Location: [facility] OR;  Service: Vascular;  Laterality: Bilateral;  . CATARACT EXTRACTION W/ INTRAOCULAR LENS IMPLANT  2013   bilateral; Dr [name]  . COLONOSCOPY  2013   negative , due 2018; Dr [name]  . COLONOSCOPY W/ POLYPECTOMY      X2 ; diverticulosis  . LUMBAR LAMINECTOMY  2000   Dr Vertell Limber  . SHOULDER SURGERY     R shoulder  . TONSILLECTOMY      Social History   Socioeconomic History  . Marital status: Married    Spouse name: None  . Number of children: None  . Years of education: None  . Highest education level: None  Social Needs  . Financial resource strain: None  . Food insecurity - worry: None  . Food insecurity - inability: None  . Transportation needs - medical: None  . Transportation needs - non-medical: None  Occupational History  . Occupation: retired  Tobacco Use  . Smoking status: Former Smoker    Packs/day: 1.00    Years: 23.00    Pack years: 23.00    Last attempt to quit: 06/14/1989    Years since quitting: 28.1  . Smokeless tobacco: Never Used  . Tobacco comment: smoked 1961-1991 , up to < 1 ppd  Substance and Sexual Activity  . Alcohol use: Yes    Alcohol/week: 8.4 oz    Types: 14 Glasses of wine per  week    Comment: Wine  . Drug use: No  . Sexual activity: None  Other Topics Concern  . None  Social History Narrative  . None    Family History  Problem Relation Age of Onset  . Heart attack Brother 22  . Parkinsonism Mother   . Stroke Mother   . Heart attack Father        pre 16  . Diabetes Sister   . COPD Sister        X 3  . Other Son        suicide 2016  . Colon cancer Neg Hx   . Cancer Neg Hx   . Esophageal cancer Neg Hx   . Liver cancer Neg Hx   . Pancreatic cancer Neg Hx   . Rectal cancer Neg Hx   . Stomach cancer Neg Hx     Review of Systems  Constitutional: Negative for chills and fever.  Respiratory: Positive for cough. Negative for shortness of breath and wheezing.   Cardiovascular: Positive for palpitations (related to anxiety). Negative for chest pain.  Musculoskeletal: Negative for arthralgias and myalgias.  Neurological: Positive for headaches. Negative for weakness and numbness.  Psychiatric/Behavioral: Positive for sleep disturbance. The patient is nervous/anxious.        Objective:   Vitals:   07/25/17 1013  BP: (!) 180/80  Pulse: 79  Resp: 16  Temp: 97.9 F (36.6 C)  SpO2: 97%   Wt Readings from Last 3 Encounters:  07/25/17 118 lb (53.5 kg)  07/21/17 118 lb (53.5 kg)  07/17/17 118 lb (53.5 kg)   Body mass index is 20.25 kg/m.   Physical Exam    Constitutional: Appears well-developed and well-nourished. No distress.  HENT:  Head: Normocephalic and atraumatic.  Neck: Neck supple. No tracheal deviation present. No thyromegaly present.  No cervical lymphadenopathy Cardiovascular: Normal rate, regular rhythm and normal heart sounds.   No murmur heard. No carotid bruit .  No edema Pulmonary/Chest: Effort normal and breath sounds normal. No respiratory distress. No has no wheezes.  No rales.  Skin: Skin is warm and dry. Not diaphoretic.  Psychiatric: anxious mood and affect. Behavior is normal.      Assessment & Plan:    See  Problem List for Assessment and Plan of chronic medical problems.

## 2017-07-25 ENCOUNTER — Encounter: Payer: Self-pay | Admitting: Internal Medicine

## 2017-07-25 ENCOUNTER — Other Ambulatory Visit (INDEPENDENT_AMBULATORY_CARE_PROVIDER_SITE_OTHER): Payer: Medicare Other

## 2017-07-25 ENCOUNTER — Ambulatory Visit: Payer: Medicare Other | Admitting: Internal Medicine

## 2017-07-25 VITALS — BP 180/80 | HR 79 | Temp 97.9°F | Resp 16 | Wt 118.0 lb

## 2017-07-25 DIAGNOSIS — F419 Anxiety disorder, unspecified: Secondary | ICD-10-CM

## 2017-07-25 DIAGNOSIS — M316 Other giant cell arteritis: Secondary | ICD-10-CM | POA: Diagnosis not present

## 2017-07-25 DIAGNOSIS — I10 Essential (primary) hypertension: Secondary | ICD-10-CM

## 2017-07-25 LAB — COMPREHENSIVE METABOLIC PANEL
ALT: 10 U/L (ref 0–35)
AST: 13 U/L (ref 0–37)
Albumin: 4.3 g/dL (ref 3.5–5.2)
Alkaline Phosphatase: 58 U/L (ref 39–117)
BUN: 16 mg/dL (ref 6–23)
CO2: 28 mEq/L (ref 19–32)
Calcium: 9.3 mg/dL (ref 8.4–10.5)
Chloride: 94 mEq/L — ABNORMAL LOW (ref 96–112)
Creatinine, Ser: 0.79 mg/dL (ref 0.40–1.20)
GFR: 75.73 mL/min (ref >60.00)
Glucose, Bld: 138 mg/dL — ABNORMAL HIGH (ref 70–99)
Potassium: 3.1 mEq/L — ABNORMAL LOW (ref 3.5–5.1)
Sodium: 137 mEq/L (ref 135–145)
Total Bilirubin: 0.7 mg/dL (ref 0.2–1.2)
Total Protein: 7.5 g/dL (ref 6.0–8.3)

## 2017-07-25 LAB — CBC WITH DIFFERENTIAL/PLATELET
Basophils Absolute: 116 cells/uL (ref 0–200)
Basophils Relative: 0.6 %
Eosinophils Absolute: 19 cells/uL (ref 15–500)
Eosinophils Relative: 0.1 %
HCT: 44 % (ref 35.0–45.0)
Hemoglobin: 14.4 g/dL (ref 11.7–15.5)
Lymphs Abs: 970 cells/uL (ref 850–3900)
MCH: 31.6 pg (ref 27.0–33.0)
MCHC: 32.7 g/dL (ref 32.0–36.0)
MCV: 96.7 fL (ref 80.0–100.0)
MPV: 8.9 fL (ref 7.5–12.5)
Monocytes Relative: 2.8 %
Neutro Abs: 17751 cells/uL — ABNORMAL HIGH (ref 1500–7800)
Neutrophils Relative %: 91.5 %
Platelets: 455 10*3/uL — ABNORMAL HIGH (ref 140–400)
RBC: 4.55 10*6/uL (ref 3.80–5.10)
RDW: 14 % (ref 11.0–15.0)
Total Lymphocyte: 5 %
WBC mixed population: 543 cells/uL (ref 200–950)
WBC: 19.4 10*3/uL — ABNORMAL HIGH (ref 3.8–10.8)

## 2017-07-25 MED ORDER — OXYCODONE-ACETAMINOPHEN 5-325 MG PO TABS: 1.0000 | ORAL_TABLET | Freq: Four times a day (QID) | ORAL | 0 refills | Status: DC | PRN

## 2017-07-25 MED ORDER — LORAZEPAM 0.5 MG PO TABS: 0.5000 mg | ORAL_TABLET | Freq: Every day | ORAL | 2 refills | Status: DC

## 2017-07-25 NOTE — Assessment & Plan Note (Signed)
Significant related to vision loss and recent hospitalization Continue Lexapro 10 mg daily-May need to increase this in the near future to 20 mg Has taken Xanax as needed and this was helpful-we will prescribe Ativan for nighttime if needed

## 2017-07-25 NOTE — Assessment & Plan Note (Signed)
Was well controlled in the hospital, but elevated here today Likely elevation is related to pain and anxiety Continue current dose of losartan

## 2017-07-25 NOTE — Assessment & Plan Note (Signed)
Has been experiencing TMJ with some headaches and being treated by orthodontist Recent increase in headaches resulting in vision loss, bilaterally Diagnosis confirmed with bilateral temporal artery biopsies that showed active temporal arteritis Treated with Solu-Medrol IV times 3 days in the hospital, currently taking prednisone 60 mg daily Will refer urgently to neurology, rheumatology and ophthalmology per patient's request Will let specialist taper steroids, but if she is not seeing anyone in the next 2 weeks-decrease prednisone to 50 mg daily

## 2017-07-25 NOTE — Patient Instructions (Signed)
  Test(s) ordered today.  We will call you with the results.     Medications reviewed and updated.  Changes include  Taking lorazepam at night for sleep.  Continue your other medications.   Your prescription(s) have been submitted to your pharmacy. Please take as directed and contact our office if you believe you are having problem(s) with the medication(s).  A referral was ordered for [facility], rheumatology, neurology.  They will call you to schedule.     Call with any questions.

## 2017-07-26 ENCOUNTER — Other Ambulatory Visit: Payer: Self-pay | Admitting: Internal Medicine

## 2017-07-26 ENCOUNTER — Telehealth: Payer: Self-pay | Admitting: Internal Medicine

## 2017-07-26 DIAGNOSIS — M316 Other giant cell arteritis: Secondary | ICD-10-CM

## 2017-07-26 DIAGNOSIS — H53133 Sudden visual loss, bilateral: Secondary | ICD-10-CM

## 2017-07-26 MED ORDER — POTASSIUM CHLORIDE CRYS ER 20 MEQ PO TBCR: 40.0000 meq | EXTENDED_RELEASE_TABLET | Freq: Every day | ORAL | 5 refills | Status: DC

## 2017-07-26 NOTE — Telephone Encounter (Signed)
Dr Hassell Done is an Ophthalmologist with [facility]

## 2017-07-26 NOTE — Telephone Encounter (Signed)
ordered

## 2017-07-26 NOTE — Telephone Encounter (Signed)
Copied from [place]. Topic: Referral - Request >> Jul 26, 2017 10:50 AM Scherrie Gerlach wrote: Reason for CRM: Husband called to advise they do want proceed with a referral to   Dr Jolyn Nap [facility] Address: [address], [place], [place] [address] Phone: [phone]  Also thank you for the referral to Dr Dohmeier.  Pt has appt tomorrow

## 2017-07-27 ENCOUNTER — Telehealth: Payer: Self-pay | Admitting: Emergency Medicine

## 2017-07-27 ENCOUNTER — Ambulatory Visit: Payer: Medicare Other | Admitting: Neurology

## 2017-07-27 ENCOUNTER — Encounter: Payer: Self-pay | Admitting: Neurology

## 2017-07-27 ENCOUNTER — Other Ambulatory Visit: Payer: Self-pay | Admitting: Neurology

## 2017-07-27 VITALS — BP 147/68 | HR 70 | Ht 63.0 in | Wt 118.0 lb

## 2017-07-27 DIAGNOSIS — M2669 Other specified disorders of temporomandibular joint: Secondary | ICD-10-CM | POA: Diagnosis not present

## 2017-07-27 DIAGNOSIS — M316 Other giant cell arteritis: Secondary | ICD-10-CM

## 2017-07-27 DIAGNOSIS — H543 Unqualified visual loss, both eyes: Secondary | ICD-10-CM | POA: Diagnosis not present

## 2017-07-27 MED ORDER — TRAMADOL HCL 50 MG PO TABS: 50.0000 mg | ORAL_TABLET | Freq: Three times a day (TID) | ORAL | 0 refills | Status: DC | PRN

## 2017-07-27 NOTE — Addendum Note (Signed)
Addended by: Larey Seat on: 07/27/2017 03:27 PM   Modules accepted: Orders

## 2017-07-27 NOTE — Telephone Encounter (Signed)
LVM informing pt and husband.

## 2017-07-27 NOTE — Telephone Encounter (Signed)
Pts husband called asking if pt could have an RX for Tramadol so that she has something for her headaches that is not as strong as Oxy. Please send to [facility]

## 2017-07-27 NOTE — Patient Instructions (Signed)
Temporal Arteritis  Temporal arteritis, also called giant cell arteritis, is a condition that causes arteries to become swollen (inflamed). It usually affects arteries in your head and face, but arteries in any part of the body can become inflamed. Temporal arteritis can cause serious problems, such as bone loss, diabetes, and blindness. What are the causes? The cause is unknown. What increases the risk?  Being older than 50.  Being a woman.  Being Caucasian.  Being of Danish, Swedish, Finnish, Norwegian, or Icelandic ancestry.  Having polymyalgia rheumatica (PMR). What are the signs or symptoms? Some people with temporal arteritis have just one symptom, while other have several symptoms. Most signs and symptoms are related to the head and face. Signs and symptoms may include:  Hard or swollen temples (common). Your temples are the flattened area on either side of your forehead. If your temples are swollen, it may hurt to touch them.  Pain when combing your hair or when laying your head down.  Pain in the jaw when chewing.  Pain in the throat or tongue.  Problems with your vision, such as sudden loss of vision in one eye, or seeing double.  Fever.  Fatigue.  A dry cough.  Pain in the hips and shoulders.  Pain in the arms during exercise.  Depression.  Weight loss.  How is this diagnosed? Your health care provider will ask about your symptoms and do a physical exam. He or she may also perform an eye exam and tests, such as:  A complete blood count.  An erythrocyte sedimentation rate test, also called the sed rate test.  A C-reactive protein (CRP) test.  A tissue sample (biopsy) test.  How is this treated? Temporal arteritis is treated with a type of medicine called a corticosteroid. Vision problems may be treated with additional medicines. You will need to see your health care provider while you are being treated. During follow-up visits, your health care  provider will check for problems by:  Performing blood tests and bone density tests.  Checking your blood pressure and blood sugar.  Follow these instructions at home:  Take medicines only as directed by your health care provider.  Take any vitamins or supplements that your health care provider suggests. These may include vitamin D and calcium, which help keep your bones from becoming weak.  Exercise. Talk with your health care provider about what exercises are okay for you to do. Usually exercises that increase your heart rate (aerobic exercise), such as walking, are recommended. Aerobic exercise helps control your blood pressure and prevent bone loss.  Follow a healthy diet. Include healthy sources of protein, fruits, vegetables, and whole grains in your diet. Following a healthy diet helps prevent bone damage and diabetes. Contact a health care provider if:  Your symptoms get worse.  Your fever, fatigue, headache, weight loss, or pain in your jaw gets worse.  You develop signs of infection, such as fever, swelling, redness, warmth, and tenderness. Get help right away if:  Your vision gets worse.  Your pain does not go away, even after you take pain medicine.  You have chest pain.  You have trouble breathing.  One side of your face or body suddenly becomes weak or numb. This information is not intended to replace advice given to you by your health care provider. Make sure you discuss any questions you have with your health care provider. Document Released: 03/28/2009 Document Revised: 01/29/2016 Document Reviewed: 07/25/2013 Elsevier Interactive Patient Education  2018 [employer]   [employer].  

## 2017-07-27 NOTE — Telephone Encounter (Signed)
sent 

## 2017-07-27 NOTE — Progress Notes (Signed)
Provider:  Larey [name], M D  Referring Provider: Cari Caraway, MD Primary Care Physician:  Crystal Rail, MD   Chief complaint.  The patient developed vision loss in the left eye on 18 July 2017, went with his deficits to see her optometrist, Crystal. Nicki [name] on [address] who questioned a left optic nerve pallor and wondered about an inflammatory or even infectious process.  He urged the patient to go to the emergency room.  She was not seen for the whole day.  She returned on 5 February to the ER by that time her right vision also was impaired and by 3:30 PM on Monday she had developed bilateral blindness.  This was 19 July 2017.    HPI:  Crystal [name] is a 72 y.o. female  is seen here as a referral from Crystal. Quay [name] and Crystal [name], OD.  I have the pleasure of meeting Mr. and Crystal [name] today on 27 July 2017.  Crystal [name] had a quite traumatic course that began with noticeable left eye vision decrease acuity and field.  She was seen by her optometrist Crystal. Nicki [name] on Sunday, 18 July 2017, he urged her to present to the hospital emergency room he also wanted her to have a brain MRI at that time.  In the emergency room she had to wait 8 hours without being evaluated. She returned home, and   presented with headaches and had meanwhile developed bilateral blindness by the time she was seen a second time in the hospital ED. On Monday she was finally evaluated, and was finally receiving a brain MRI to noon, also including the orbits.  The results from 18 July 2017 stated no acute infarction, hemorrhage hydrocephalus etc. bilateral optic perineuritis.  Inflammatory pattern.  This pattern led to the differential diagnosis of giant cell arteritis.  The patient had a history of headaches and jaw claudication.  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  They finally begun treating her with high-dose steroids and she was placed on Solu-Medrol.  Her procedure had been ordered through the vascular  surgeon Crystal [name] who performed bilateral temporal artery biopsies.  These were sent to pathology, and she followed up with her PCP - Crystal. Quay [name].  After she  saw her on Monday, 11 February, she quoted that neurology was consulted for probable temporal arteritis the patient received 1 g of IV Solu-Medrol as well as morphine for the pain.  He had 3 IV doses of IV Solu-Medrol.  She was discharged home on oral 60 mg prednisone daily. She was reduced to 30 mg a day after the visit.    She has remained completely blind since Monday , 07-18-2017.  Review of Systems: Out of a complete 14 system review, the patient complains of only the following symptoms, and all other reviewed systems are negative.  vision loss, indurated temporal arteries.   Social History   Socioeconomic History  . Marital status: Married    Spouse name: Not on file  . Number of children: Not on file  . Years of education: Not on file  . Highest education level: Not on file  Social Needs  . Financial resource strain: Not on file  . Food insecurity - worry: Not on file  . Food insecurity - inability: Not on file  . Transportation needs - medical: Not on file  . Transportation needs - non-medical: Not on file  Occupational History  . Occupation: retired  Tobacco Use  .  Smoking status: Former Smoker    Packs/day: 1.00    Years: 23.00    Pack years: 23.00    Last attempt to quit: 06/14/1989    Years since quitting: 28.1  . Smokeless tobacco: Never Used  . Tobacco comment: smoked 1961-1991 , up to < 1 ppd  Substance and Sexual Activity  . Alcohol use: Yes    Alcohol/week: 8.4 oz    Types: 14 Glasses of wine per week    Comment: Wine  . Drug use: No  . Sexual activity: Not on file  Other Topics Concern  . Not on file  Social History Narrative  . Not on file    Family History  Problem Relation Age of Onset  . Heart attack Brother 66  . Parkinsonism Mother   . Stroke Mother   . Heart attack Father         pre 70  . Diabetes Sister   . COPD Sister        X 3  . Other Son        suicide 2016  . Colon cancer Neg Hx   . Cancer Neg Hx   . Esophageal cancer Neg Hx   . Liver cancer Neg Hx   . Pancreatic cancer Neg Hx   . Rectal cancer Neg Hx   . Stomach cancer Neg Hx     Past Medical History:  Diagnosis Date  . Adenomatous colon polyp 2007 & 2010    Crystal [name]  . Allergy    seasonal  . Arthritis   . Cataract   . Diverticulosis   . Hyperlipidemia   . Hypertension   . IBS (irritable bowel syndrome)   . MVP (mitral valve prolapse)   . Peripheral vascular disease ([place])   . RAD (reactive airway disease)     Past Surgical History:  Procedure Laterality Date  . ABDOMINAL HYSTERECTOMY     with USO for dysfunctionall menses  . APPENDECTOMY    . ARTERY BIOPSY Bilateral 07/21/2017   Procedure: BIOPSY BILATERAL TEMPORAL ARTERY;  Surgeon: [name], MD;  Location: [facility];  Service: Vascular;  Laterality: Bilateral;  . CATARACT EXTRACTION W/ INTRAOCULAR LENS IMPLANT  2013   bilateral; Crystal Crystal [name]  . COLONOSCOPY  2013   negative , due 2018; Crystal [name]  . COLONOSCOPY W/ POLYPECTOMY      X2 ; diverticulosis  . LUMBAR LAMINECTOMY  2000   Crystal Crystal [name]  . SHOULDER SURGERY     R shoulder  . TONSILLECTOMY      Current Outpatient Medications  Medication Sig Dispense Refill  . acetaminophen (TYLENOL) 500 MG tablet Take 1,000 mg by mouth every 6 (six) hours as needed for headache.    Marland Kitchen aspirin 325 MG tablet Take 325 mg by mouth as needed for mild pain.    . Calcium Carbonate-Vitamin D (CALCIUM 600/VITAMIN D PO) Take 1 tablet by mouth daily.     . Cholecalciferol (VITAMIN D3) 1000 UNITS CAPS Take 3,000 Units by mouth daily with lunch.     . escitalopram (LEXAPRO) 10 MG tablet Take 1 tablet (10 mg total) by mouth daily. 30 tablet 0  . furosemide (LASIX) 40 MG tablet TAKE ONE TABLET BY MOUTH DAILY (Patient taking differently: TAKE 40mg  MOUTH once DAILY) 90 tablet 0  . LORazepam (ATIVAN) 0.5  MG tablet Take 1 tablet (0.5 mg total) by mouth at bedtime. 30 tablet 2  . losartan (COZAAR) 50 MG tablet TAKE 0.5 TABLETS (25 MG TOTAL)  BY MOUTH DAILY. 45 tablet 1  . montelukast (SINGULAIR) 10 MG tablet TAKE ONE TABLET BY MOUTH DAILY (Patient taking differently: TAKE ONE TABLET BY MOUTH in the evening) 90 tablet 1  . oxyCODONE-acetaminophen (PERCOCET/ROXICET) 5-325 MG tablet Take 1 tablet by mouth every 6 (six) hours as needed for severe pain. 30 tablet 0  . potassium chloride SA (K-DUR,KLOR-CON) 20 MEQ tablet Take 2 tablets (40 mEq total) by mouth daily. 60 tablet 5  . pravastatin (PRAVACHOL) 40 MG tablet TAKE 1 TABLET DAILY BY MOUTH AT BEDTIME (Patient taking differently: TAKE 40mg  BY MOUTH AT BEDTIME) 90 tablet 1  . predniSONE (DELTASONE) 20 MG tablet Take 3 tablets (60 mg total) by mouth daily. 90 tablet 0  . Probiotic Product (ALIGN) 4 MG CAPS Take 4 mg by mouth daily.     . SYMBICORT 160-4.5 MCG/ACT inhaler INHALE 1 TO 2 PUFFS BY MOUTH EVERY 12 HOURS THEN GARGLE AND SPIT AFTER USE 10.2 g 11  . theophylline (UNIPHYL) 400 MG 24 hr tablet TAKE 1 TABLET (400 MG TOTAL) BY MOUTH DAILY. (Patient taking differently: Take 200 mg by mouth 2 (two) times daily. ) 90 tablet 3  . traMADol (ULTRAM) 50 MG tablet Take 1-2 tablets (50-100 mg total) by mouth every 8 (eight) hours as needed. 60 tablet 0  . trimethoprim (TRIMPEX) 100 MG tablet Take 100 mg by mouth as needed (bladder problems during travel).     . VENTOLIN HFA 108 (90 Base) MCG/ACT inhaler Inhale 1-2 puffs into the lungs every 6 (six) hours as needed for wheezing or shortness of breath. 1 Inhaler 8   No current facility-administered medications for this visit.     Allergies as of 07/27/2017 - Review Complete 07/27/2017  Allergen Reaction Noted  . Simvastatin    . Prednisone Hives 06/04/2017    Vitals: BP (!) 147/68   Pulse 70   Ht 5\' 3"  (1.6 m)   Wt 118 lb (53.5 kg)   BMI 20.90 kg/m  Last Weight:  Wt Readings from Last 1 Encounters:   07/27/17 118 lb (53.5 kg)   Last Height:   Ht Readings from Last 1 Encounters:  07/27/17 5\' 3"  (1.6 m)    Physical exam:  General: The patient is awake, alert and appears not in acute distress. The patient is well groomed. Head: Normocephalic, atraumatic. Neck is supple.  The patient has a palpable click with opening of her jaw right over left, she has tenderness with jaw movement and retroauricular, she also has a tender and indurated area over her left and right temple.  The biopsy scars have well-healed.  Cardiovascular:  Regular rate and rhythm , without  murmurs or carotid bruit, and without distended neck veins. Respiratory: Lungs are clear to auscultation. Skin:  Without evidence of edema, or rash-at this time there is no evidence of polymyositis or dermatomyositis.  Patients with giant cell arthritis are more likely to develop polymyalgia rheumatica, disease but manifests as muscle ache, pain and weakness. Trunk: BMI is normal   Neurologic exam : The patient is awake and alert, oriented to place and time.  Memory subjective  described as intact. There is a normal attention span & concentration ability. Speech is fluent without  dysarthria, dysphonia or aphasia. Mood and affect are appropriate.  Cranial nerves: The patient is blind .  Pupils are equal abut not reactive to light.  Funduscopic exam witht evidence of pallor . Extraocular movements in vertical and horizontal planes intact but she can not see,  cannot focus.  Visual fields by finger perimetry are intact. Hearing to finger rub intact.  Facial sensation intact to fine touch. Facial motor strength is symmetric and tongue and uvula move midline. Tongue protrusion into either cheek is normal. Shoulder shrug is normal.   Motor exam: Normal tone ,muscle bulk and symmetric strength in all extremities. No myalgia, soreness and no weakness. Sensory:  Fine touch, pinprick and vibration were tested in all extremities.  Proprioception was normal. Coordination: Rapid alternating movements in the fingers/hands were normal. Finger-to-nose maneuver  normal without evidence of ataxia, dysmetria or tremor. Gait and station: Patient walks with assistance due to vision loss.  Deep tendon reflexes: in the  upper and lower extremities are symmetric and intact. Babinski maneuver response is downgoing.   Assessment:  After physical and neurologic examination, review of laboratory studies, imaging, neurophysiology testing and pre-existing records, assessment is that of :   Bilateral temporal arteritis. Blindness secondary to.   [name]:  Treatment [name] and additional workup :  Slow weaning of oral steroids.  Currently on 30 mg with no polymyalgia symptoms. Still has claudication at the jaw. Stay at 30 mg for another 7 days, than 20 than 10 mg , follow with neuro-ophthalmologist Crystal Hassell Done or partner.  I will order C reactive protein (, sed rate will not longer be elevated) And CK CKMB>     Crystal Seat MD 07/27/2017

## 2017-07-28 ENCOUNTER — Telehealth: Payer: Self-pay

## 2017-07-28 DIAGNOSIS — H3411 Central retinal artery occlusion, right eye: Secondary | ICD-10-CM | POA: Diagnosis not present

## 2017-07-28 DIAGNOSIS — M316 Other giant cell arteritis: Secondary | ICD-10-CM | POA: Diagnosis not present

## 2017-07-28 DIAGNOSIS — H4321 Crystalline deposits in vitreous body, right eye: Secondary | ICD-10-CM | POA: Diagnosis not present

## 2017-07-28 DIAGNOSIS — H04123 Dry eye syndrome of bilateral lacrimal glands: Secondary | ICD-10-CM | POA: Diagnosis not present

## 2017-07-28 LAB — CK TOTAL AND CKMB (NOT AT ARMC)
CK-MB Index: <1 ng/mL (ref 0.0–5.3)
Total CK: 21 U/L — ABNORMAL LOW (ref 24–173)

## 2017-07-28 LAB — C-REACTIVE PROTEIN: CRP: 1.4 mg/L (ref 0.0–4.9)

## 2017-07-28 NOTE — Telephone Encounter (Signed)
-----   Message from Darleen Crocker, RN sent at 07/28/2017  3:14 PM EST -----   ----- Message ----- From: Larey Seat, MD Sent: 07/28/2017   2:41 PM To: Darleen Crocker, RN, [name], MD  No evidencne of  Myositis, polymyalgia rheumatica.   Cc tim [place], [facility]

## 2017-07-28 NOTE — Telephone Encounter (Signed)
I called and patient's husband answered and said that he had just left the house but that he would be back home shortly and would have the patient call us back.

## 2017-07-28 NOTE — Telephone Encounter (Signed)
Pt's husband returning call

## 2017-07-28 NOTE — Telephone Encounter (Signed)
I spoke with patient and she is aware of results by phone. She mentioned that she did she Dr. Hassell Done today and he is trying to get her in with [facility].

## 2017-08-01 DIAGNOSIS — M316 Other giant cell arteritis: Secondary | ICD-10-CM | POA: Diagnosis not present

## 2017-08-01 DIAGNOSIS — R51 Headache: Secondary | ICD-10-CM | POA: Diagnosis not present

## 2017-08-04 DIAGNOSIS — F419 Anxiety disorder, unspecified: Secondary | ICD-10-CM | POA: Diagnosis not present

## 2017-08-04 DIAGNOSIS — M316 Other giant cell arteritis: Secondary | ICD-10-CM | POA: Diagnosis not present

## 2017-08-08 ENCOUNTER — Other Ambulatory Visit: Payer: Self-pay | Admitting: Internal Medicine

## 2017-08-08 ENCOUNTER — Encounter: Payer: Self-pay | Admitting: Internal Medicine

## 2017-08-08 ENCOUNTER — Ambulatory Visit: Payer: Medicare Other | Admitting: Internal Medicine

## 2017-08-08 VITALS — BP 160/62 | HR 74 | Temp 97.5°F | Resp 16

## 2017-08-08 DIAGNOSIS — B029 Zoster without complications: Secondary | ICD-10-CM | POA: Diagnosis not present

## 2017-08-08 MED ORDER — VALACYCLOVIR HCL 1 G PO TABS: 1000.0000 mg | ORAL_TABLET | Freq: Three times a day (TID) | ORAL | 0 refills | Status: DC

## 2017-08-08 NOTE — Assessment & Plan Note (Signed)
Rash and symptoms consistent with shingles She has had some new lesions in the last few days and is still on high dose steroids so I will start valtrex TID x 7 days On gabapentin and increasing per ophtho  - continue Can put anything on topically for comfort such as aloe vera Call if no improvement

## 2017-08-08 NOTE — Patient Instructions (Addendum)
Your rash is shingles.  This will resolve.     Take the anti-viral, valtrex, three times a day for one week.  Continue the gabapentin as prescribed by your eye doctor.     You can apply any medication that is soothing topically, such as aloe vera.

## 2017-08-08 NOTE — Progress Notes (Signed)
Subjective:    Patient ID: Crystal [name], female    DOB: 12-30-1945, 72 y.o.   MRN: [id number]  HPI The patient is here for an acute visit.  Rash:  Just after she came home from the hospital she felt a rash on her  upper right posterior leg. The rash is now around her anal area and in the past few days it is on the left buttock region.  These lesion are new in the past few days. It is a burning type pain.  It does not itch. She denies pain.  She is taking gabapentin for her headaches.  They did try putting on a steroid cream.    She is very anxious and that is likely why her BP is elevated.     Medications and allergies reviewed with patient and updated if appropriate.  Patient Active Problem List   Diagnosis Date Noted  . Herpes zoster without complication 02/72/5366  . Complete loss of vision 07/27/2017  . Jaw claudication 07/27/2017  . Temporal arteritis ([place]) 07/18/2017  . Vision, loss, sudden, bilateral 07/18/2017  . 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 Parotitis 10/06/2016  . Osteoarthritis 06/25/2016  . Anxiety 06/25/2016  . Back pain 07/18/2015  . 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 Osteopenia 06/03/2008  . History of colonic polyps 06/03/2008  . INTENTION TREMOR 12/06/2007  . IBS 10/16/2007  . Hyperlipidemia 06/06/2007  . Essential hypertension 06/06/2007  . Chronic obstructive airway disease with asthma ([place]) 02/23/2007    Current Outpatient Medications on File Prior to Visit  Medication Sig Dispense Refill  . acetaminophen (TYLENOL) 500 MG tablet Take 1,000 mg by mouth every 6 (six) hours as needed for headache.    Marland Kitchen aspirin 325 MG tablet Take 325 mg by mouth as needed for mild pain.    . Calcium Carbonate-Vitamin D (CALCIUM 600/VITAMIN D PO) Take 1 tablet by mouth daily.     . Cholecalciferol (VITAMIN D3) 1000 UNITS CAPS Take 3,000 Units by mouth daily with lunch.     . escitalopram (LEXAPRO) 10 MG tablet Take 1 tablet (10 mg total) by  mouth daily. 30 tablet 0  . LORazepam (ATIVAN) 0.5 MG tablet Take 1 tablet (0.5 mg total) by mouth at bedtime. 30 tablet 2  . losartan (COZAAR) 50 MG tablet TAKE 0.5 TABLETS (25 MG TOTAL) BY MOUTH DAILY. 45 tablet 1  . montelukast (SINGULAIR) 10 MG tablet TAKE ONE TABLET BY MOUTH DAILY (Patient taking differently: TAKE ONE TABLET BY MOUTH in the evening) 90 tablet 1  . oxyCODONE-acetaminophen (PERCOCET/ROXICET) 5-325 MG tablet Take 1 tablet by mouth every 6 (six) hours as needed for severe pain. 30 tablet 0  . potassium chloride SA (K-DUR,KLOR-CON) 20 MEQ tablet Take 2 tablets (40 mEq total) by mouth daily. 60 tablet 5  . pravastatin (PRAVACHOL) 40 MG tablet TAKE 1 TABLET DAILY BY MOUTH AT BEDTIME (Patient taking differently: TAKE 40mg  BY MOUTH AT BEDTIME) 90 tablet 1  . predniSONE (DELTASONE) 20 MG tablet Take 3 tablets (60 mg total) by mouth daily. 90 tablet 0  . Probiotic Product (ALIGN) 4 MG CAPS Take 4 mg by mouth daily.     . SYMBICORT 160-4.5 MCG/ACT inhaler INHALE 1 TO 2 PUFFS BY MOUTH EVERY 12 HOURS THEN GARGLE AND SPIT AFTER USE 10.2 g 11  . theophylline (UNIPHYL) 400 MG 24 hr tablet TAKE 1 TABLET (400 MG TOTAL) BY MOUTH DAILY. (Patient taking differently: Take 200 mg by mouth 2 (two)  times daily. ) 90 tablet 3  . traMADol (ULTRAM) 50 MG tablet Take 1-2 tablets (50-100 mg total) by mouth every 8 (eight) hours as needed. 60 tablet 0  . trimethoprim (TRIMPEX) 100 MG tablet Take 100 mg by mouth as needed (bladder problems during travel).     . VENTOLIN HFA 108 (90 Base) MCG/ACT inhaler Inhale 1-2 puffs into the lungs every 6 (six) hours as needed for wheezing or shortness of breath. 1 Inhaler 8   No current facility-administered medications on file prior to visit.     Past Medical History:  Diagnosis Date  . Adenomatous colon polyp 2007 & 2010    Dr [name]  . Allergy    seasonal  . Arthritis   . Cataract   . Diverticulosis   . Hyperlipidemia   . Hypertension   . IBS (irritable  bowel syndrome)   . MVP (mitral valve prolapse)   . Peripheral vascular disease ([place])   . RAD (reactive airway disease)     Past Surgical History:  Procedure Laterality Date  . ABDOMINAL HYSTERECTOMY     with USO for dysfunctionall menses  . APPENDECTOMY    . ARTERY BIOPSY Bilateral 07/21/2017   Procedure: BIOPSY BILATERAL TEMPORAL ARTERY;  Surgeon: [name], MD;  Location: [place] [place];  Service: Vascular;  Laterality: Bilateral;  . CATARACT EXTRACTION W/ INTRAOCULAR LENS IMPLANT  2013   bilateral; Dr [name]  . COLONOSCOPY  2013   negative , due 2018; Dr [name]  . COLONOSCOPY W/ POLYPECTOMY      X2 ; diverticulosis  . LUMBAR LAMINECTOMY  2000   Dr Vertell Limber  . SHOULDER SURGERY     R shoulder  . TONSILLECTOMY      Social History   Socioeconomic History  . Marital status: Married    Spouse name: None  . Number of children: None  . Years of education: None  . Highest education level: None  Social Needs  . Financial resource strain: None  . Food insecurity - worry: None  . Food insecurity - inability: None  . Transportation needs - medical: None  . Transportation needs - non-medical: None  Occupational History  . Occupation: retired  Tobacco Use  . Smoking status: Former Smoker    Packs/day: 1.00    Years: 23.00    Pack years: 23.00    Last attempt to quit: 06/14/1989    Years since quitting: 28.1  . Smokeless tobacco: Never Used  . Tobacco comment: smoked 1961-1991 , up to < 1 ppd  Substance and Sexual Activity  . Alcohol use: Yes    Alcohol/week: 8.4 oz    Types: 14 Glasses of wine per week    Comment: Wine  . Drug use: No  . Sexual activity: None  Other Topics Concern  . None  Social History Narrative  . None    Family History  Problem Relation Age of Onset  . Heart attack Brother 4  . Parkinsonism Mother   . Stroke Mother   . Heart attack Father        pre 65  . Diabetes Sister   . COPD Sister        X 3  . Other Son        suicide 2016  .  Colon cancer Neg Hx   . Cancer Neg Hx   . Esophageal cancer Neg Hx   . Liver cancer Neg Hx   . Pancreatic cancer Neg Hx   . Rectal cancer  Neg Hx   . Stomach cancer Neg Hx     Review of Systems  Constitutional: Negative for chills and fever.  Skin: Positive for rash (burning pain).  Neurological: Positive for headaches.       Objective:   Vitals:   08/08/17 1309  BP: (!) 160/62  Pulse: 74  Resp: 16  Temp: (!) 97.5 F (36.4 C)  SpO2: 96%   Wt Readings from Last 3 Encounters:  07/27/17 118 lb (53.5 kg)  07/25/17 118 lb (53.5 kg)  07/21/17 118 lb (53.5 kg)   There is no height or weight on file to calculate BMI.   Physical Exam  Constitutional: She appears well-developed and well-nourished. No distress.  HENT:  Head: Normocephalic and atraumatic.  Skin: Skin is warm and dry. Rash (right buttock, anal region and left buttock region - excoriations, no blisters or papules) noted. She is not diaphoretic.  Psychiatric:  Very anxious           Assessment & Plan:    See Problem List for Assessment and Plan of chronic medical problems.

## 2017-08-19 ENCOUNTER — Other Ambulatory Visit: Payer: Self-pay | Admitting: Internal Medicine

## 2017-08-29 ENCOUNTER — Inpatient Hospital Stay (HOSPITAL_COMMUNITY)
Admission: EM | Admit: 2017-08-29 | Discharge: 2017-09-01 | DRG: 253 | Disposition: A | Discharge status: Home / Self Care | Payer: Medicare Other | Attending: Cardiology | Admitting: Cardiology

## 2017-08-29 ENCOUNTER — Encounter (HOSPITAL_COMMUNITY): Payer: Self-pay | Admitting: Emergency Medicine

## 2017-08-29 ENCOUNTER — Inpatient Hospital Stay (HOSPITAL_COMMUNITY): Payer: Medicare Other | Admitting: Certified Registered Nurse Anesthetist

## 2017-08-29 ENCOUNTER — Emergency Department (HOSPITAL_COMMUNITY)
Admit: 2017-08-29 | Discharge: 2017-08-29 | Disposition: A | Discharge status: Home / Self Care | Payer: Medicare Other | Attending: Physician Assistant | Admitting: Physician Assistant

## 2017-08-29 ENCOUNTER — Other Ambulatory Visit: Payer: Self-pay | Admitting: Physician Assistant

## 2017-08-29 ENCOUNTER — Encounter (HOSPITAL_COMMUNITY)
Admission: EM | Disposition: A | Discharge status: Home / Self Care | Payer: Self-pay | Source: Home / Self Care | Attending: Cardiology

## 2017-08-29 ENCOUNTER — Emergency Department (HOSPITAL_COMMUNITY): Payer: Medicare Other

## 2017-08-29 DIAGNOSIS — Z7982 Long term (current) use of aspirin: Secondary | ICD-10-CM

## 2017-08-29 DIAGNOSIS — I480 Paroxysmal atrial fibrillation: Secondary | ICD-10-CM

## 2017-08-29 DIAGNOSIS — M79604 Pain in right leg: Secondary | ICD-10-CM

## 2017-08-29 DIAGNOSIS — H543 Unqualified visual loss, both eyes: Secondary | ICD-10-CM | POA: Diagnosis not present

## 2017-08-29 DIAGNOSIS — M1990 Unspecified osteoarthritis, unspecified site: Secondary | ICD-10-CM | POA: Diagnosis not present

## 2017-08-29 DIAGNOSIS — D62 Acute posthemorrhagic anemia: Secondary | ICD-10-CM | POA: Diagnosis not present

## 2017-08-29 DIAGNOSIS — M79609 Pain in unspecified limb: Secondary | ICD-10-CM

## 2017-08-29 DIAGNOSIS — Z833 Family history of diabetes mellitus: Secondary | ICD-10-CM

## 2017-08-29 DIAGNOSIS — I2510 Atherosclerotic heart disease of native coronary artery without angina pectoris: Secondary | ICD-10-CM | POA: Diagnosis not present

## 2017-08-29 DIAGNOSIS — I743 Embolism and thrombosis of arteries of the lower extremities: Principal | ICD-10-CM | POA: Diagnosis present

## 2017-08-29 DIAGNOSIS — Z87891 Personal history of nicotine dependence: Secondary | ICD-10-CM | POA: Diagnosis not present

## 2017-08-29 DIAGNOSIS — I998 Other disorder of circulatory system: Secondary | ICD-10-CM | POA: Diagnosis not present

## 2017-08-29 DIAGNOSIS — E785 Hyperlipidemia, unspecified: Secondary | ICD-10-CM | POA: Diagnosis not present

## 2017-08-29 DIAGNOSIS — M316 Other giant cell arteritis: Secondary | ICD-10-CM | POA: Diagnosis present

## 2017-08-29 DIAGNOSIS — I82411 Acute embolism and thrombosis of right femoral vein: Secondary | ICD-10-CM | POA: Diagnosis not present

## 2017-08-29 DIAGNOSIS — Z79899 Other long term (current) drug therapy: Secondary | ICD-10-CM

## 2017-08-29 DIAGNOSIS — M79662 Pain in left lower leg: Secondary | ICD-10-CM | POA: Diagnosis present

## 2017-08-29 DIAGNOSIS — R58 Hemorrhage, not elsewhere classified: Secondary | ICD-10-CM | POA: Diagnosis not present

## 2017-08-29 DIAGNOSIS — R002 Palpitations: Secondary | ICD-10-CM | POA: Diagnosis not present

## 2017-08-29 DIAGNOSIS — Z8249 Family history of ischemic heart disease and other diseases of the circulatory system: Secondary | ICD-10-CM | POA: Diagnosis not present

## 2017-08-29 DIAGNOSIS — I10 Essential (primary) hypertension: Secondary | ICD-10-CM | POA: Diagnosis not present

## 2017-08-29 DIAGNOSIS — M79605 Pain in left leg: Secondary | ICD-10-CM | POA: Diagnosis not present

## 2017-08-29 DIAGNOSIS — Z90710 Acquired absence of both cervix and uterus: Secondary | ICD-10-CM

## 2017-08-29 DIAGNOSIS — J45909 Unspecified asthma, uncomplicated: Secondary | ICD-10-CM | POA: Diagnosis present

## 2017-08-29 DIAGNOSIS — R52 Pain, unspecified: Secondary | ICD-10-CM | POA: Diagnosis not present

## 2017-08-29 DIAGNOSIS — I472 Ventricular tachycardia: Secondary | ICD-10-CM | POA: Diagnosis not present

## 2017-08-29 DIAGNOSIS — F410 Panic disorder [episodic paroxysmal anxiety] without agoraphobia: Secondary | ICD-10-CM | POA: Diagnosis not present

## 2017-08-29 DIAGNOSIS — E7800 Pure hypercholesterolemia, unspecified: Secondary | ICD-10-CM | POA: Diagnosis not present

## 2017-08-29 DIAGNOSIS — I361 Nonrheumatic tricuspid (valve) insufficiency: Secondary | ICD-10-CM | POA: Diagnosis not present

## 2017-08-29 DIAGNOSIS — R0602 Shortness of breath: Secondary | ICD-10-CM | POA: Diagnosis not present

## 2017-08-29 DIAGNOSIS — M79606 Pain in leg, unspecified: Secondary | ICD-10-CM | POA: Diagnosis not present

## 2017-08-29 HISTORY — PX: EMBOLECTOMY: SHX44

## 2017-08-29 HISTORY — DX: Embolism and thrombosis of arteries of the lower extremities: I74.3

## 2017-08-29 HISTORY — DX: Paroxysmal atrial fibrillation: I48.0

## 2017-08-29 LAB — CBC WITH DIFFERENTIAL/PLATELET
Basophils Absolute: 0 10*3/uL (ref 0.0–0.1)
Basophils Relative: 0 %
Eosinophils Absolute: 0 10*3/uL (ref 0.0–0.7)
Eosinophils Relative: 0 %
HCT: 36.9 % (ref 36.0–46.0)
Hemoglobin: 12.2 g/dL (ref 12.0–15.0)
Lymphocytes Relative: 28 %
Lymphs Abs: 2.9 10*3/uL (ref 0.7–4.0)
MCH: 33.9 pg (ref 26.0–34.0)
MCHC: 33.1 g/dL (ref 30.0–36.0)
MCV: 102.5 fL — ABNORMAL HIGH (ref 78.0–100.0)
Monocytes Absolute: 1.1 10*3/uL — ABNORMAL HIGH (ref 0.1–1.0)
Monocytes Relative: 10 %
Neutro Abs: 6.4 10*3/uL (ref 1.7–7.7)
Neutrophils Relative %: 62 %
Platelets: 313 10*3/uL (ref 150–400)
RBC: 3.6 MIL/uL — ABNORMAL LOW (ref 3.87–5.11)
RDW: 18.7 % — ABNORMAL HIGH (ref 11.5–15.5)
WBC: 10.4 10*3/uL (ref 4.0–10.5)

## 2017-08-29 LAB — COMPREHENSIVE METABOLIC PANEL
ALT: 17 U/L (ref 14–54)
AST: 18 U/L (ref 15–41)
Albumin: 3.7 g/dL (ref 3.5–5.0)
Alkaline Phosphatase: 49 U/L (ref 38–126)
Anion gap: 17 — ABNORMAL HIGH (ref 5–15)
BUN: 14 mg/dL (ref 6–20)
CO2: 20 mmol/L — ABNORMAL LOW (ref 22–32)
Calcium: 9.2 mg/dL (ref 8.9–10.3)
Chloride: 97 mmol/L — ABNORMAL LOW (ref 101–111)
Creatinine, Ser: 0.94 mg/dL (ref 0.44–1.00)
GFR calc Af Amer: >60 mL/min (ref >60)
GFR calc non Af Amer: 59 mL/min — ABNORMAL LOW (ref >60)
Glucose, Bld: 89 mg/dL (ref 65–99)
Potassium: 4.1 mmol/L (ref 3.5–5.1)
Sodium: 134 mmol/L — ABNORMAL LOW (ref 135–145)
Total Bilirubin: 0.8 mg/dL (ref 0.3–1.2)
Total Protein: 6.1 g/dL — ABNORMAL LOW (ref 6.5–8.1)

## 2017-08-29 LAB — SURGICAL PCR SCREEN
MRSA, PCR: NEGATIVE
Staphylococcus aureus: NEGATIVE

## 2017-08-29 LAB — TSH: TSH: 1.86 u[IU]/mL (ref 0.350–4.500)

## 2017-08-29 LAB — I-STAT TROPONIN, ED: Troponin i, poc: 0.08 ng/mL (ref 0.00–0.08)

## 2017-08-29 LAB — MAGNESIUM: Magnesium: 2.2 mg/dL (ref 1.7–2.4)

## 2017-08-29 SURGERY — EMBOLECTOMY
Anesthesia: General | Site: Groin | Laterality: Right

## 2017-08-29 MED ORDER — LABETALOL HCL 5 MG/ML IV SOLN: 10.0000 mg | INTRAVENOUS | Status: DC | PRN

## 2017-08-29 MED ORDER — LIDOCAINE HCL (CARDIAC) 20 MG/ML IV SOLN
INTRAVENOUS | Status: DC | PRN
  Administered 2017-08-29: 60 mg via INTRAVENOUS

## 2017-08-29 MED ORDER — ALBUMIN HUMAN 5 % IV SOLN
INTRAVENOUS | Status: DC | PRN
  Administered 2017-08-29: 15:00:00 via INTRAVENOUS

## 2017-08-29 MED ORDER — ACETAMINOPHEN 325 MG PO TABS: 650.0000 mg | ORAL_TABLET | ORAL | Status: DC | PRN

## 2017-08-29 MED ORDER — FENTANYL CITRATE (PF) 250 MCG/5ML IJ SOLN
INTRAMUSCULAR | Status: AC
  Filled 2017-08-29: qty 5

## 2017-08-29 MED ORDER — HEPARIN SODIUM (PORCINE) 1000 UNIT/ML IJ SOLN
INTRAMUSCULAR | Status: DC | PRN
  Administered 2017-08-29: 5000 [IU] via INTRAVENOUS

## 2017-08-29 MED ORDER — METHYLPREDNISOLONE SODIUM SUCC 125 MG IJ SOLR
INTRAMUSCULAR | Status: DC | PRN
  Administered 2017-08-29: 125 mg via INTRAVENOUS

## 2017-08-29 MED ORDER — POTASSIUM CHLORIDE CRYS ER 20 MEQ PO TBCR
40.0000 meq | EXTENDED_RELEASE_TABLET | Freq: Every day | ORAL | Status: DC
  Administered 2017-08-29 – 2017-08-30 (×2): 40 meq via ORAL
  Filled 2017-08-29 (×2): qty 2

## 2017-08-29 MED ORDER — DILTIAZEM HCL ER COATED BEADS 180 MG PO CP24
180.0000 mg | ORAL_CAPSULE | Freq: Every day | ORAL | Status: DC
  Filled 2017-08-29: qty 1

## 2017-08-29 MED ORDER — RISAQUAD PO CAPS
1.0000 | ORAL_CAPSULE | Freq: Every day | ORAL | Status: DC
  Administered 2017-08-29 – 2017-09-01 (×4): 1 via ORAL
  Filled 2017-08-29 (×4): qty 1

## 2017-08-29 MED ORDER — FUROSEMIDE 40 MG PO TABS
40.0000 mg | ORAL_TABLET | Freq: Every day | ORAL | Status: DC
  Administered 2017-08-30 – 2017-09-01 (×3): 40 mg via ORAL
  Filled 2017-08-29 (×4): qty 1

## 2017-08-29 MED ORDER — PHENOL 1.4 % MT LIQD: 1.0000 | OROMUCOSAL | Status: DC | PRN

## 2017-08-29 MED ORDER — SUGAMMADEX SODIUM 200 MG/2ML IV SOLN
INTRAVENOUS | Status: AC
  Filled 2017-08-29: qty 2

## 2017-08-29 MED ORDER — CEFAZOLIN SODIUM-DEXTROSE 2-4 GM/100ML-% IV SOLN
2.0000 g | Freq: Once | INTRAVENOUS | Status: AC
  Administered 2017-08-29: 2 g via INTRAVENOUS

## 2017-08-29 MED ORDER — LACTATED RINGERS IV SOLN
INTRAVENOUS | Status: DC | PRN
  Administered 2017-08-29: 15:00:00 via INTRAVENOUS

## 2017-08-29 MED ORDER — ALBUTEROL SULFATE (2.5 MG/3ML) 0.083% IN NEBU: 2.5000 mg | INHALATION_SOLUTION | Freq: Four times a day (QID) | RESPIRATORY_TRACT | Status: DC | PRN

## 2017-08-29 MED ORDER — DILTIAZEM HCL-DEXTROSE 100-5 MG/100ML-% IV SOLN (PREMIX)
5.0000 mg/h | INTRAVENOUS | Status: DC
  Administered 2017-08-29: 5 mg/h via INTRAVENOUS
  Filled 2017-08-29: qty 100

## 2017-08-29 MED ORDER — TRAMADOL HCL 50 MG PO TABS
50.0000 mg | ORAL_TABLET | Freq: Three times a day (TID) | ORAL | Status: DC | PRN
  Administered 2017-08-29 – 2017-08-31 (×3): 100 mg via ORAL
  Filled 2017-08-29 (×3): qty 2

## 2017-08-29 MED ORDER — ONDANSETRON HCL 4 MG/2ML IJ SOLN
INTRAMUSCULAR | Status: AC
  Filled 2017-08-29: qty 2

## 2017-08-29 MED ORDER — FENTANYL CITRATE (PF) 100 MCG/2ML IJ SOLN
INTRAMUSCULAR | Status: DC | PRN
  Administered 2017-08-29: 100 ug via INTRAVENOUS

## 2017-08-29 MED ORDER — MAGNESIUM SULFATE 2 GM/50ML IV SOLN
2.0000 g | Freq: Every day | INTRAVENOUS | Status: DC | PRN
  Filled 2017-08-29: qty 50

## 2017-08-29 MED ORDER — CEFAZOLIN SODIUM-DEXTROSE 2-4 GM/100ML-% IV SOLN
INTRAVENOUS | Status: AC
  Filled 2017-08-29: qty 100

## 2017-08-29 MED ORDER — MUPIROCIN 2 % EX OINT
1.0000 "application " | TOPICAL_OINTMENT | Freq: Once | CUTANEOUS | Status: AC
  Administered 2017-08-29: 1 via TOPICAL

## 2017-08-29 MED ORDER — MONTELUKAST SODIUM 10 MG PO TABS
10.0000 mg | ORAL_TABLET | Freq: Every evening | ORAL | Status: DC
  Administered 2017-08-29 – 2017-09-01 (×4): 10 mg via ORAL
  Filled 2017-08-29 (×4): qty 1

## 2017-08-29 MED ORDER — TRIMETHOPRIM 100 MG PO TABS
100.0000 mg | ORAL_TABLET | ORAL | Status: DC | PRN
  Filled 2017-08-29: qty 1

## 2017-08-29 MED ORDER — 0.9 % SODIUM CHLORIDE (POUR BTL) OPTIME
TOPICAL | Status: DC | PRN
  Administered 2017-08-29 (×2): 1000 mL

## 2017-08-29 MED ORDER — PROPOFOL 10 MG/ML IV BOLUS
INTRAVENOUS | Status: DC | PRN
  Administered 2017-08-29: 110 mg via INTRAVENOUS

## 2017-08-29 MED ORDER — HEPARIN BOLUS VIA INFUSION
3000.0000 [IU] | Freq: Once | INTRAVENOUS | Status: AC
  Administered 2017-08-29: 3000 [IU] via INTRAVENOUS
  Filled 2017-08-29: qty 3000

## 2017-08-29 MED ORDER — POTASSIUM CHLORIDE CRYS ER 20 MEQ PO TBCR: 20.0000 meq | EXTENDED_RELEASE_TABLET | Freq: Every day | ORAL | Status: DC | PRN

## 2017-08-29 MED ORDER — DILTIAZEM LOAD VIA INFUSION
10.0000 mg | Freq: Once | INTRAVENOUS | Status: AC
  Administered 2017-08-29: 10 mg via INTRAVENOUS
  Filled 2017-08-29: qty 10

## 2017-08-29 MED ORDER — CEFAZOLIN SODIUM-DEXTROSE 1-4 GM/50ML-% IV SOLN
1.0000 g | Freq: Three times a day (TID) | INTRAVENOUS | Status: AC
  Administered 2017-08-30 (×2): 1 g via INTRAVENOUS
  Filled 2017-08-29 (×2): qty 50

## 2017-08-29 MED ORDER — GUAIFENESIN-DM 100-10 MG/5ML PO SYRP: 15.0000 mL | ORAL_SOLUTION | ORAL | Status: DC | PRN

## 2017-08-29 MED ORDER — PHENYLEPHRINE HCL 10 MG/ML IJ SOLN
INTRAVENOUS | Status: DC | PRN
  Administered 2017-08-29: 40 ug/min via INTRAVENOUS

## 2017-08-29 MED ORDER — LORAZEPAM 0.5 MG PO TABS
0.5000 mg | ORAL_TABLET | Freq: Every day | ORAL | Status: DC
  Administered 2017-08-29 – 2017-08-31 (×3): 0.5 mg via ORAL
  Filled 2017-08-29 (×3): qty 1

## 2017-08-29 MED ORDER — HEPARIN (PORCINE) IN NACL 100-0.45 UNIT/ML-% IJ SOLN
650.0000 [IU]/h | INTRAMUSCULAR | Status: DC
  Administered 2017-08-29: 850 [IU]/h via INTRAVENOUS
  Filled 2017-08-29 (×2): qty 250

## 2017-08-29 MED ORDER — HEPARIN (PORCINE) IN NACL 100-0.45 UNIT/ML-% IJ SOLN
850.0000 [IU]/h | INTRAMUSCULAR | Status: DC
  Administered 2017-08-29: 850 [IU]/h via INTRAVENOUS
  Filled 2017-08-29 (×2): qty 250

## 2017-08-29 MED ORDER — APIXABAN 5 MG PO TABS
5.0000 mg | ORAL_TABLET | Freq: Two times a day (BID) | ORAL | Status: DC
  Filled 2017-08-29: qty 1

## 2017-08-29 MED ORDER — THEOPHYLLINE ER 400 MG PO TB24
200.0000 mg | ORAL_TABLET | Freq: Two times a day (BID) | ORAL | Status: DC
Start: 2017-08-29 — End: 2017-09-01
  Administered 2017-08-29 – 2017-09-01 (×6): 200 mg via ORAL
  Filled 2017-08-29 (×7): qty 0.5

## 2017-08-29 MED ORDER — METOPROLOL TARTRATE 5 MG/5ML IV SOLN: 2.0000 mg | INTRAVENOUS | Status: DC | PRN

## 2017-08-29 MED ORDER — MOMETASONE FURO-FORMOTEROL FUM 200-5 MCG/ACT IN AERO
2.0000 | INHALATION_SPRAY | Freq: Two times a day (BID) | RESPIRATORY_TRACT | Status: DC
  Administered 2017-08-30 – 2017-09-01 (×5): 2 via RESPIRATORY_TRACT
  Filled 2017-08-29: qty 8.8

## 2017-08-29 MED ORDER — HYDRALAZINE HCL 20 MG/ML IJ SOLN: 5.0000 mg | INTRAMUSCULAR | Status: DC | PRN

## 2017-08-29 MED ORDER — SODIUM CHLORIDE 0.9 % IV SOLN: 500.0000 mL | Freq: Once | INTRAVENOUS | Status: DC | PRN

## 2017-08-29 MED ORDER — SUGAMMADEX SODIUM 200 MG/2ML IV SOLN
INTRAVENOUS | Status: DC | PRN
  Administered 2017-08-29: 100 mg via INTRAVENOUS

## 2017-08-29 MED ORDER — GLYCOPYRROLATE 0.2 MG/ML IJ SOLN
INTRAMUSCULAR | Status: DC | PRN
  Administered 2017-08-29: 0.2 mg via INTRAVENOUS

## 2017-08-29 MED ORDER — GABAPENTIN 400 MG PO CAPS
400.0000 mg | ORAL_CAPSULE | Freq: Every day | ORAL | Status: DC
  Administered 2017-08-29 – 2017-08-31 (×3): 400 mg via ORAL
  Filled 2017-08-29 (×2): qty 1

## 2017-08-29 MED ORDER — PRAVASTATIN SODIUM 40 MG PO TABS
40.0000 mg | ORAL_TABLET | Freq: Every day | ORAL | Status: DC
  Administered 2017-08-29 – 2017-09-01 (×4): 40 mg via ORAL
  Filled 2017-08-29 (×4): qty 1

## 2017-08-29 MED ORDER — PREDNISONE 20 MG PO TABS
40.0000 mg | ORAL_TABLET | Freq: Every day | ORAL | Status: DC
  Administered 2017-08-29 – 2017-09-01 (×4): 40 mg via ORAL
  Filled 2017-08-29 (×4): qty 2

## 2017-08-29 MED ORDER — MORPHINE SULFATE (PF) 2 MG/ML IV SOLN: 2.0000 mg | INTRAVENOUS | Status: DC | PRN

## 2017-08-29 MED ORDER — PANTOPRAZOLE SODIUM 40 MG PO TBEC
40.0000 mg | DELAYED_RELEASE_TABLET | Freq: Every day | ORAL | Status: DC
  Administered 2017-08-30 – 2017-09-01 (×3): 40 mg via ORAL
  Filled 2017-08-29 (×4): qty 1

## 2017-08-29 MED ORDER — LOSARTAN POTASSIUM 25 MG PO TABS
25.0000 mg | ORAL_TABLET | Freq: Every day | ORAL | Status: DC
  Administered 2017-08-29: 25 mg via ORAL
  Filled 2017-08-29 (×3): qty 1

## 2017-08-29 MED ORDER — DILTIAZEM HCL ER COATED BEADS 120 MG PO CP24
120.0000 mg | ORAL_CAPSULE | Freq: Every day | ORAL | Status: DC
  Administered 2017-08-29 – 2017-09-01 (×4): 120 mg via ORAL
  Filled 2017-08-29 (×4): qty 1

## 2017-08-29 MED ORDER — ALIGN 4 MG PO CAPS: 4.0000 mg | ORAL_CAPSULE | Freq: Every day | ORAL | Status: DC

## 2017-08-29 MED ORDER — MUPIROCIN 2 % EX OINT
TOPICAL_OINTMENT | CUTANEOUS | Status: AC
  Administered 2017-08-29: 1 via TOPICAL
  Filled 2017-08-29: qty 22

## 2017-08-29 MED ORDER — ROCURONIUM BROMIDE 100 MG/10ML IV SOLN
INTRAVENOUS | Status: DC | PRN
  Administered 2017-08-29: 50 mg via INTRAVENOUS

## 2017-08-29 MED ORDER — ONDANSETRON HCL 4 MG/2ML IJ SOLN
4.0000 mg | Freq: Four times a day (QID) | INTRAMUSCULAR | Status: DC | PRN
  Administered 2017-08-29: 4 mg via INTRAVENOUS

## 2017-08-29 MED ORDER — SODIUM CHLORIDE 0.9 % IV SOLN
INTRAVENOUS | Status: DC | PRN
  Administered 2017-08-29: 14:00:00

## 2017-08-29 MED ORDER — ALBUTEROL SULFATE HFA 108 (90 BASE) MCG/ACT IN AERS: 1.0000 | INHALATION_SPRAY | Freq: Four times a day (QID) | RESPIRATORY_TRACT | Status: DC | PRN

## 2017-08-29 SURGICAL SUPPLY — 49 items
BANDAGE ESMARK 6X9 LF (GAUZE/BANDAGES/DRESSINGS) IMPLANT
BNDG ESMARK 6X9 LF (GAUZE/BANDAGES/DRESSINGS)
CANISTER SUCT 3000ML PPV (MISCELLANEOUS) ×2 IMPLANT
CATH EMB 3FR 80CM (CATHETERS) IMPLANT
CATH EMB 4FR 80CM (CATHETERS) ×2 IMPLANT
CATH EMB 5FR 80CM (CATHETERS) IMPLANT
CLIP LIGATING EXTRA MED SLVR (CLIP) ×2 IMPLANT
CLIP LIGATING EXTRA SM BLUE (MISCELLANEOUS) ×2 IMPLANT
CUFF TOURNIQUET SINGLE 34IN LL (TOURNIQUET CUFF) IMPLANT
CUFF TOURNIQUET SINGLE 44IN (TOURNIQUET CUFF) IMPLANT
DERMABOND ADHESIVE PROPEN (GAUZE/BANDAGES/DRESSINGS) ×1
DERMABOND ADVANCED (GAUZE/BANDAGES/DRESSINGS) ×1
DERMABOND ADVANCED .7 DNX12 (GAUZE/BANDAGES/DRESSINGS) ×1 IMPLANT
DERMABOND ADVANCED .7 DNX6 (GAUZE/BANDAGES/DRESSINGS) ×1 IMPLANT
DRAIN SNY 10X20 3/4 PERF (WOUND CARE) IMPLANT
DRAPE X-RAY CASS 24X20 (DRAPES) IMPLANT
ELECT REM PT RETURN 9FT ADLT (ELECTROSURGICAL) ×2
ELECTRODE REM PT RTRN 9FT ADLT (ELECTROSURGICAL) ×1 IMPLANT
EVACUATOR SILICONE 100CC (DRAIN) IMPLANT
GLOVE BIOGEL PI IND STRL 6.5 (GLOVE) ×3 IMPLANT
GLOVE BIOGEL PI IND STRL 7.0 (GLOVE) ×4 IMPLANT
GLOVE BIOGEL PI INDICATOR 6.5 (GLOVE) ×3
GLOVE BIOGEL PI INDICATOR 7.0 (GLOVE) ×4
GLOVE SS BIOGEL STRL SZ 7.5 (GLOVE) ×1 IMPLANT
GLOVE SUPERSENSE BIOGEL SZ 7.5 (GLOVE) ×1
GLOVE SURG SS PI 6.5 STRL IVOR (GLOVE) ×8 IMPLANT
GOWN STRL REUS W/ TWL LRG LVL3 (GOWN DISPOSABLE) ×4 IMPLANT
GOWN STRL REUS W/TWL LRG LVL3 (GOWN DISPOSABLE) ×4
KIT BASIN OR (CUSTOM PROCEDURE TRAY) ×2 IMPLANT
KIT ROOM TURNOVER OR (KITS) ×2 IMPLANT
NS IRRIG 1000ML POUR BTL (IV SOLUTION) ×4 IMPLANT
PACK PERIPHERAL VASCULAR (CUSTOM PROCEDURE TRAY) ×2 IMPLANT
PAD ARMBOARD 7.5X6 YLW CONV (MISCELLANEOUS) ×4 IMPLANT
PADDING CAST COTTON 6X4 STRL (CAST SUPPLIES) IMPLANT
SET COLLECT BLD 21X3/4 12 (NEEDLE) IMPLANT
STAPLER VISISTAT 35W (STAPLE) IMPLANT
STOPCOCK 4 WAY LG BORE MALE ST (IV SETS) IMPLANT
SUT ETHILON 3 0 PS 1 (SUTURE) IMPLANT
SUT PROLENE 5 0 C 1 24 (SUTURE) IMPLANT
SUT PROLENE 6 0 CC (SUTURE) ×8 IMPLANT
SUT VIC AB 2-0 CTX 36 (SUTURE) ×2 IMPLANT
SUT VIC AB 3-0 SH 27 (SUTURE) ×1
SUT VIC AB 3-0 SH 27X BRD (SUTURE) ×1 IMPLANT
SYR 3ML LL SCALE MARK (SYRINGE) ×2 IMPLANT
TOWEL GREEN STERILE (TOWEL DISPOSABLE) ×2 IMPLANT
TRAY FOLEY MTR SLVR 16FR STAT (CATHETERS) ×2 IMPLANT
TUBING EXTENTION W/L.L. (IV SETS) IMPLANT
UNDERPAD 30X30 (UNDERPADS AND DIAPERS) ×2 IMPLANT
WATER STERILE IRR 1000ML POUR (IV SOLUTION) ×2 IMPLANT

## 2017-08-29 NOTE — Consult Note (Addendum)
Hospital Consult    Reason for Consult:  SFA occlusion right leg Requesting Physician:  Janace Hoard, [facility] MRN #:  [id number]  History of Present Illness: This is a 72 y.o. female who last month presented to the hospital with bilateral blindness and severe headaches.  She underwent bilateral temporal artery biopsies, which were positive for active temporal arteritis bilaterally.  She is seeing a specialist at [facility].  She is on a prednisone taper.    She states that last night, she was woken up by a dull constant ache in both her feet.  She states this starting ascending up both legs.  She says she didn't think she would be able to walk, but she could.  She states that her husband called 80.  She states the pain lasted for about an hour.  She states she has never had any cramping in her legs when she walks and denies any non healing wounds.  She was sent for a duplex as well as ABI's.  ABI on the right is decreased and the duplex revealed a right SFA occlusion.  She states that as she was on the way to the hospital, the paramedic asked her if she had any hx of afib, which she said no.  She was diagnosed with afib and has now been started on Eliquis.  She is now back in sinus rhythm.  She has subsequently developed shingles and is on Valtrex.   She takes a daily aspirin.  She is on an ARB for blood pressure control.  The pt is on a statin for cholesterol management.   Past Medical History:  Diagnosis Date  . Adenomatous colon polyp 2007 & 2010    Dr [name]  . Allergy    seasonal  . Arthritis   . Cataract   . Diverticulosis   . Hyperlipidemia   . Hypertension   . IBS (irritable bowel syndrome)   . MVP (mitral valve prolapse)   . Peripheral vascular disease ([place])   . RAD (reactive airway disease)     Past Surgical History:  Procedure Laterality Date  . ABDOMINAL HYSTERECTOMY     with USO for dysfunctionall menses  . APPENDECTOMY    . ARTERY BIOPSY Bilateral 07/21/2017   Procedure: BIOPSY BILATERAL TEMPORAL ARTERY;  Surgeon: [name], MD;  Location: [place];  Service: Vascular;  Laterality: Bilateral;  . CATARACT EXTRACTION W/ INTRAOCULAR LENS IMPLANT  2013   bilateral; Dr [name]  . COLONOSCOPY  2013   negative , due 2018; Dr [name]  . COLONOSCOPY W/ POLYPECTOMY      X2 ; diverticulosis  . LUMBAR LAMINECTOMY  2000   Dr Vertell Limber  . SHOULDER SURGERY     R shoulder  . TONSILLECTOMY      Allergies  Allergen Reactions  . Simvastatin     myalgias  . Prednisone Hives    Prior to Admission medications   Medication Sig Start Date End Date Taking? Authorizing Provider  acetaminophen (TYLENOL) 500 MG tablet Take 1,000 mg by mouth every 6 (six) hours as needed for headache.   Yes [provider]  aspirin 81 MG tablet Take 81 mg by mouth as needed for mild pain.    Yes [provider]  Calcium Carbonate-Vitamin D (CALCIUM 600/VITAMIN D PO) Take 1 tablet by mouth daily.    Yes [provider]  Cholecalciferol (VITAMIN D3) 1000 UNITS CAPS Take 3,000 Units by mouth daily with lunch.    Yes [provider]  furosemide (LASIX) 40 MG tablet TAKE ONE TABLET BY MOUTH DAILY 08/08/17  Yes Burns, Claudina Lick, MD  gabapentin (NEURONTIN) 100 MG capsule Take 400 mg by mouth at bedtime.  08/05/17  Yes [provider]  LORazepam (ATIVAN) 0.5 MG tablet Take 1 tablet (0.5 mg total) by mouth at bedtime. 07/25/17  Yes Burns, Claudina Lick, MD  losartan (COZAAR) 50 MG tablet TAKE 0.5 TABLETS (25 MG TOTAL) BY MOUTH DAILY. 08/19/17  Yes Burns, Claudina Lick, MD  montelukast (SINGULAIR) 10 MG tablet TAKE ONE TABLET BY MOUTH DAILY Patient taking differently: TAKE ONE TABLET BY MOUTH in the evening 07/11/17  Yes Burns, Claudina Lick, MD  potassium chloride SA (K-DUR,KLOR-CON) 20 MEQ tablet Take 2 tablets (40 mEq total) by mouth daily. 07/26/17  Yes Burns, Claudina Lick, MD  pravastatin (PRAVACHOL) 40 MG tablet TAKE 1 TABLET DAILY BY MOUTH AT BEDTIME Patient taking  differently: TAKE 40mg  BY MOUTH AT BEDTIME 07/11/17  Yes Burns, Claudina Lick, MD  predniSONE (DELTASONE) 20 MG tablet Take 3 tablets (60 mg total) by mouth daily. Patient taking differently: Take 40 mg by mouth daily.  07/22/17  Yes Patrecia Pour, Christean Grief, MD  Probiotic Product (ALIGN) 4 MG CAPS Take 4 mg by mouth daily.    Yes [provider]  SYMBICORT 160-4.5 MCG/ACT inhaler INHALE 1 TO 2 PUFFS BY MOUTH EVERY 12 HOURS THEN GARGLE AND SPIT AFTER USE 07/27/16  Yes Burns, Claudina Lick, MD  theophylline (UNIPHYL) 400 MG 24 hr tablet TAKE 1 TABLET (400 MG TOTAL) BY MOUTH DAILY. Patient taking differently: Take 200 mg by mouth 2 (two) times daily.  07/04/17  Yes Burns, Claudina Lick, MD  traMADol (ULTRAM) 50 MG tablet Take 1-2 tablets (50-100 mg total) by mouth every 8 (eight) hours as needed. 07/27/17  Yes Burns, Claudina Lick, MD  trimethoprim (TRIMPEX) 100 MG tablet Take 100 mg by mouth as needed (bladder problems during travel).  09/15/11  Yes [provider]  VENTOLIN HFA 108 (90 Base) MCG/ACT inhaler Inhale 1-2 puffs into the lungs every 6 (six) hours as needed for wheezing or shortness of breath. 06/03/17  Yes Burns, Claudina Lick, MD  escitalopram (LEXAPRO) 10 MG tablet Take 1 tablet (10 mg total) by mouth daily. Patient not taking: Reported on 08/29/2017 07/23/17   Patrecia Pour, Christean Grief, MD  oxyCODONE-acetaminophen (PERCOCET/ROXICET) 5-325 MG tablet Take 1 tablet by mouth every 6 (six) hours as needed for severe pain. Patient not taking: Reported on 08/29/2017 07/25/17   [name], MD  valACYclovir (VALTREX) 1000 MG tablet Take 1 tablet (1,000 mg total) by mouth 3 (three) times daily. Patient not taking: Reported on 08/29/2017 08/08/17   [name], MD    Social History   Socioeconomic History  . Marital status: Married    Spouse name: Not on file  . Number of children: Not on file  . Years of education: Not on file  . Highest education level: Not on file  Social Needs  . Financial resource strain: Not  on file  . Food insecurity - worry: Not on file  . Food insecurity - inability: Not on file  . Transportation needs - medical: Not on file  . Transportation needs - non-medical: Not on file  Occupational History  . Occupation: retired  Tobacco Use  . Smoking status: Former Smoker    Packs/day: 1.00    Years: 23.00    Pack years: 23.00    Last attempt to quit: 06/14/1989    Years since  quitting: 28.2  . Smokeless tobacco: Never Used  . Tobacco comment: smoked 1961-1991 , up to < 1 ppd  Substance and Sexual Activity  . Alcohol use: Yes    Alcohol/week: 8.4 oz    Types: 14 Glasses of wine per week    Comment: Wine  . Drug use: No  . Sexual activity: Not on file  Other Topics Concern  . Not on file  Social History Narrative  . Not on file     Family History  Problem Relation Age of Onset  . Heart attack Brother 6  . Parkinsonism Mother   . Stroke Mother   . Heart attack Father        pre 53  . Diabetes Sister   . COPD Sister        X 3  . Other Son        suicide 2016  . Colon cancer Neg Hx   . Cancer Neg Hx   . Esophageal cancer Neg Hx   . Liver cancer Neg Hx   . Pancreatic cancer Neg Hx   . Rectal cancer Neg Hx   . Stomach cancer Neg Hx     ROS: [x]  Positive   [ ]  Negative   [ ]  All sytems reviewed and are negative  Cardiac: []  chest pain/pressure [x]  palpitations []  SOB lying flat []  DOE  Vascular: []  pain in legs while walking []  pain in legs at rest [x]  pain in both legs last night-resolved []  non-healing ulcers []  hx of DVT []  swelling in legs  Pulmonary: []  productive cough []  asthma/wheezing []  home O2  Neurologic: []  weakness in []  arms []  legs []  numbness in []  arms []  legs []  hx of CVA []  mini stroke [] difficulty speaking or slurred speech [x]   vision loss bilaterally [x]  headaches []  dizziness  Hematologic: []  hx of cancer []  bleeding problems []  problems with blood clotting easily  Endocrine:   []  diabetes []  thyroid  disease  GI []  vomiting blood []  blood in stool  GU: []  CKD/renal failure []  HD--[]  M/W/F or []  T/T/S []  burning with urination []  blood in urine  Psychiatric: [x]  anxiety [x]  depression  Musculoskeletal: []  arthritis []  joint pain  Integumentary: []  rashes []  ulcers-denies  Constitutional: []  fever []  chills   Physical Examination  Vitals:   08/29/17 1200 08/29/17 1215  BP: (!) 105/56 131/63  Pulse: 67 70  Resp: 17 16  Temp:    SpO2: 93% 97%   Body mass index is 19.22 kg/m.  General:  WDWN in NAD Gait: Not observed HENT: WNL, normocephalic Pulmonary: normal non-labored breathing, without Rales, rhonchi,  wheezing Cardiac: regular, without  Murmurs, rubs or gallops; without carotid bruits Skin: without rashes Vascular Exam/Pulses:  Right Left  Radial 2+ (normal) 2+ (normal)  Ulnar Unable to palpate  2+ (normal)  Femoral 2+ (normal) 2+ (normal)  Popliteal Unable to palpate  Unable to palpate   DP Unable to palpate  1+ (weak)  PT Unable to palpate  Unable to palpate    Extremities: without ischemic changes, without Gangrene , without cellulitis; without open wounds; both feet are cool to touch and equal. Musculoskeletal: no muscle wasting or atrophy  Neurologic: A&O X 3;  No focal weakness or paresthesias are detected; speech is fluent/normal Psychiatric:  The pt has Normal affect.   CBC    Component Value Date/Time   WBC 10.4 08/29/2017 0405   RBC 3.60 (L) 08/29/2017 0405   HGB 12.2 08/29/2017  0405   HCT 36.9 08/29/2017 0405   PLT 313 08/29/2017 0405   MCV 102.5 (H) 08/29/2017 0405   MCH 33.9 08/29/2017 0405   MCHC 33.1 08/29/2017 0405   RDW 18.7 (H) 08/29/2017 0405   LYMPHSABS 2.9 08/29/2017 0405   MONOABS 1.1 (H) 08/29/2017 0405   EOSABS 0.0 08/29/2017 0405   BASOSABS 0.0 08/29/2017 0405    BMET    Component Value Date/Time   NA 134 (L) 08/29/2017 0405   K 4.1 08/29/2017 0405   CL 97 (L) 08/29/2017 0405   CO2 20 (L) 08/29/2017  0405   GLUCOSE 89 08/29/2017 0405   GLUCOSE 83 06/01/2006 0950   BUN 14 08/29/2017 0405   CREATININE 0.94 08/29/2017 0405   CALCIUM 9.2 08/29/2017 0405   GFRNONAA 59 (L) 08/29/2017 0405   GFRAA >60 08/29/2017 0405    COAGS: No results found for: INR, PROTIME   Non-Invasive Vascular Imaging:   ABI's 08/29/17: Right:  0.56 Left:  1.05  Statin:  Yes.   Beta Blocker:  No. Aspirin:  Yes.   ACEI:  Yes.   ARB:  No. CCB use:  No Other antiplatelets/anticoagulants:  Yes.   Eliquis (started today)   ASSESSMENT/PLAN: This is a 72 y.o. female with recent diagnosis of bilateral active temporal arteritis now on prednisone taper presented to the ER today with bilateral leg pain and irregular heartbeat.   -pt diagnosed with Afib today and has been placed on Eliquis.   -she did c/o bilateral dull constant pain in her both her feet that ascended up her legs that lasted for about an hour and has now resolved.   She denies any cramping sensations while walking or non healing wounds.   She has never had any pain in her feet or legs in the past.  -her ABI's are decreased on the right and it is reported that she has a right SFA occlusion, however, this would not cause bilateral foot pain.   -Most likely will have her f/u in our office in a few months with repeat ABI's and arterial duplex.  -Dr. Donnetta Hutching to see pt this afternoon.   Leontine Locket, PA-C [facility] [phone]  I have examined the patient, reviewed and agree with above.  Very unusual presentation.  Initially had 10 out of 10 pain in her feet extending up into her knees bilaterally.  This is now totally resolved.  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  Today was found to have new diagnosis of atrial fibrillation with rapid ventricular response.  On physical exam she has easily palpable dorsalis pedis pulse on the left.  She has bounding right femoral  pulse but absent popliteal and distal pulse on the right.  She currently denies any ischemic type symptoms. Her noninvasive studies revealed normal ankle arm index on the left and diminished at 0.58 on the right. Ultrasound shows what appears to be thrombotic occlusion of her right superficial femoral artery with reconstitution of the popliteal artery above the knee.  There is no ultrasound evidence of plaque and this does appear to be consistent with an embolus.  I discussed this with the patient and her husband present.  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  I would recommend operative exploration for right superficial femoral artery embolectomy.  Explained that it is an outside possibility that this is chronic and  that there would be no treatment if this was chronic other than expiration.  She understands and we will proceed for emergent right groin exploration  Curt Jews, MD 08/29/2017 1:20 PM

## 2017-08-29 NOTE — Progress Notes (Signed)
VASCULAR LAB PRELIMINARY  PRELIMINARY  PRELIMINARY  PRELIMINARY  Bilateral lower extremity venous duplex completed.    Preliminary report:  There is no DVT or SVT noted in the bilateral lower extremities.  Incidentally, there is acute occlusion of the right proximal to mid femoral artery.  Called Fabian Sharp, PA-C with results.   Trinton Prewitt, RVT 08/29/2017, 12:56 PM

## 2017-08-29 NOTE — ED Provider Notes (Addendum)
08/29/17 10:17 AM  Spoke with Dr. Stanford Breed, cardiologist. States he evaluated the patient and is concerned about her leg pain. States he will add in duplex ultrasounds as well as have ABIs performed.  Patient is currently in sinus rhythm.  She will be started on Eliquis. He will detail his findings further in his note.    Lorayne Bender, PA-C 08/29/17 1018    Lorayne Bender, PA-C 08/29/17 [place], [place], DO 08/29/17 [phone]

## 2017-08-29 NOTE — ED Provider Notes (Addendum)
[facility]ERGENCY DEPARTMENT Provider Note   CSN: [id number] Arrival date & time: 08/29/17  0400     History   Chief Complaint Chief Complaint  Patient presents with  . Leg Pain    HPI Crystal [name] is a 72 y.o. female.  Patient is a 72 year old female with past medical history of recently diagnosed giant cell arteritis causing loss of vision in both eyes.  She also has a history of hypertension, hyperlipidemia and mitral valve prolapse.  She presents today for evaluation of leg pain and palpitations.  She woke from sleep this morning complaining of pain in both legs.  While being transported here she was found to be in atrial fibrillation with rapid ventricular response.  She denies any chest pain or difficulty breathing.  He is currently taking prednisone for the giant cell arteritis.  Her dose was recently decreased to 40 mg.   The history is provided by the patient.  Palpitations   This is a new problem. The current episode started 1 to 2 hours ago. The problem occurs constantly. The problem has not changed since onset.The problem is associated with stress. Pertinent negatives include no fever, no chest pain, no abdominal pain, no lower extremity edema, no cough and no shortness of breath. She has tried nothing for the symptoms.    Past Medical History:  Diagnosis Date  . Adenomatous colon polyp 2007 & 2010    Dr [name]  . Allergy    seasonal  . Arthritis   . Cataract   . Diverticulosis   . Hyperlipidemia   . Hypertension   . IBS (irritable bowel syndrome)   . MVP (mitral valve prolapse)   . Peripheral vascular disease ([place])   . RAD (reactive airway disease)     Patient Active Problem List   Diagnosis Date Noted  . Herpes zoster without complication 14/48/1856  . Complete loss of vision 07/27/2017  . Jaw claudication 07/27/2017  . Temporal arteritis ([place]) 07/18/2017  . Vision, loss, sudden, bilateral 07/18/2017  . TMJ arthralgia 06/27/2017   . Parotitis 10/06/2016  . Osteoarthritis 06/25/2016  . Anxiety 06/25/2016  . Back pain 07/18/2015  . 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 Osteopenia 06/03/2008  . History of colonic polyps 06/03/2008  . INTENTION TREMOR 12/06/2007  . IBS 10/16/2007  . Hyperlipidemia 06/06/2007  . Essential hypertension 06/06/2007  . Chronic obstructive airway disease with asthma ([place]) 02/23/2007    Past Surgical History:  Procedure Laterality Date  . ABDOMINAL HYSTERECTOMY     with USO for dysfunctionall menses  . APPENDECTOMY    . ARTERY BIOPSY Bilateral 07/21/2017   Procedure: BIOPSY BILATERAL TEMPORAL ARTERY;  Surgeon: [name], MD;  Location: [facility];  Service: Vascular;  Laterality: Bilateral;  . CATARACT EXTRACTION W/ INTRAOCULAR LENS IMPLANT  2013   bilateral; Dr [name]  . COLONOSCOPY  2013   negative , due 2018; Dr [name]  . COLONOSCOPY W/ POLYPECTOMY      X2 ; diverticulosis  . LUMBAR LAMINECTOMY  2000   Dr Vertell Limber  . SHOULDER SURGERY     R shoulder  . TONSILLECTOMY      OB History    No data available       Home Medications    Prior to Admission medications   Medication Sig Start Date End Date Taking? Authorizing Provider  acetaminophen (TYLENOL) 500 MG tablet Take 1,000 mg by mouth every 6 (six) hours as needed for  headache.    [provider]  aspirin 325 MG tablet Take 325 mg by mouth as needed for mild pain.    [provider]  Calcium Carbonate-Vitamin D (CALCIUM 600/VITAMIN D PO) Take 1 tablet by mouth daily.     [provider]  Cholecalciferol (VITAMIN D3) 1000 UNITS CAPS Take 3,000 Units by mouth daily with lunch.     [provider]  escitalopram (LEXAPRO) 10 MG tablet Take 1 tablet (10 mg total) by mouth daily. 07/23/17   Patrecia Pour, Christean Grief, MD  furosemide (LASIX) 40 MG tablet TAKE ONE TABLET BY MOUTH DAILY 08/08/17   [name], MD  LORazepam (ATIVAN) 0.5 MG tablet Take 1 tablet (0.5  mg total) by mouth at bedtime. 07/25/17   [name], MD  losartan (COZAAR) 50 MG tablet TAKE 0.5 TABLETS (25 MG TOTAL) BY MOUTH DAILY. 08/19/17   Burns, Claudina Lick, MD  montelukast (SINGULAIR) 10 MG tablet TAKE ONE TABLET BY MOUTH DAILY Patient taking differently: TAKE ONE TABLET BY MOUTH in the evening 07/11/17   [name], MD  oxyCODONE-acetaminophen (PERCOCET/ROXICET) 5-325 MG tablet Take 1 tablet by mouth every 6 (six) hours as needed for severe pain. 07/25/17   [name], MD  potassium chloride SA (K-DUR,KLOR-CON) 20 MEQ tablet Take 2 tablets (40 mEq total) by mouth daily. 07/26/17   [name], MD  pravastatin (PRAVACHOL) 40 MG tablet TAKE 1 TABLET DAILY BY MOUTH AT BEDTIME Patient taking differently: TAKE 40mg  BY MOUTH AT BEDTIME 07/11/17   [name], MD  predniSONE (DELTASONE) 20 MG tablet Take 3 tablets (60 mg total) by mouth daily. 07/22/17   Doreatha Lew, MD  Probiotic Product (ALIGN) 4 MG CAPS Take 4 mg by mouth daily.     [provider]  SYMBICORT 160-4.5 MCG/ACT inhaler INHALE 1 TO 2 PUFFS BY MOUTH EVERY 12 HOURS THEN GARGLE AND SPIT AFTER USE 07/27/16   [name], MD  theophylline (UNIPHYL) 400 MG 24 hr tablet TAKE 1 TABLET (400 MG TOTAL) BY MOUTH DAILY. Patient taking differently: Take 200 mg by mouth 2 (two) times daily.  07/04/17   [name], MD  traMADol (ULTRAM) 50 MG tablet Take 1-2 tablets (50-100 mg total) by mouth every 8 (eight) hours as needed. 07/27/17   [name], MD  trimethoprim (TRIMPEX) 100 MG tablet Take 100 mg by mouth as needed (bladder problems during travel).  09/15/11   [provider]  valACYclovir (VALTREX) 1000 MG tablet Take 1 tablet (1,000 mg total) by mouth 3 (three) times daily. 08/08/17   [name], MD  VENTOLIN HFA 108 (90 Base) MCG/ACT inhaler Inhale 1-2 puffs into the lungs every 6 (six) hours as needed for wheezing or shortness of breath. 06/03/17   [name], MD    Family History Family  History  Problem Relation Age of Onset  . Heart attack Brother 78  . Parkinsonism Mother   . Stroke Mother   . Heart attack Father        pre 54  . Diabetes Sister   . COPD Sister        X 3  . Other Son        suicide 2016  . Colon cancer Neg Hx   . Cancer Neg Hx   . Esophageal cancer Neg Hx   . Liver cancer Neg Hx   . Pancreatic cancer Neg Hx   . Rectal cancer Neg Hx   .  Stomach cancer Neg Hx     Social History Social History   Tobacco Use  . Smoking status: Former Smoker    Packs/day: 1.00    Years: 23.00    Pack years: 23.00    Last attempt to quit: 06/14/1989    Years since quitting: 28.2  . Smokeless tobacco: Never Used  . Tobacco comment: smoked 1961-1991 , up to < 1 ppd  Substance Use Topics  . Alcohol use: Yes    Alcohol/week: 8.4 oz    Types: 14 Glasses of wine per week    Comment: Wine  . Drug use: No     Allergies   Simvastatin and Prednisone   Review of Systems Review of Systems  Constitutional: Negative for fever.  Respiratory: Negative for cough and shortness of breath.   Cardiovascular: Positive for palpitations. Negative for chest pain.  Gastrointestinal: Negative for abdominal pain.  All other systems reviewed and are negative.    Physical Exam Updated Vital Signs BP (!) 118/92 (BP Location: Right Arm)   Pulse (!) 137   Temp (!) 97.2 F (36.2 C) (Oral)   Resp 18   Ht 5\' 4"  (1.626 m)   Wt 50.8 kg (112 lb)   SpO2 98%   BMI 19.22 kg/m   Physical Exam  Constitutional: She is oriented to person, place, and time. She appears well-developed and well-nourished. No distress.  HENT:  Head: Normocephalic and atraumatic.  Neck: Normal range of motion. Neck supple.  Cardiovascular: Exam reveals no gallop and no friction rub.  No murmur heard. Heart is irregularly irregular and rapid.  Pulmonary/Chest: Effort normal and breath sounds normal. No respiratory distress. She has no wheezes.  Abdominal: Soft. Bowel sounds are normal. She  exhibits no distension. There is no tenderness.  Musculoskeletal: Normal range of motion. She exhibits no edema or tenderness.  Neurological: She is alert and oriented to person, place, and time.  Skin: Skin is warm and dry. She is not diaphoretic.  Nursing note and vitals reviewed.    ED Treatments / Results  Labs (all labs ordered are listed, but only abnormal results are displayed) Labs Reviewed  COMPREHENSIVE METABOLIC PANEL  CBC WITH DIFFERENTIAL/PLATELET  TSH  I-STAT TROPONIN, ED    EKG  EKG Interpretation  Date/Time:  Monday August 29 2017 04:04:22 EDT Ventricular Rate:  132 PR Interval:    QRS Duration: 79 QT Interval:  290 QTC Calculation: 430 R Axis:   16 Text Interpretation:  Atrial fibrillation with rapid ventricular response Anterior infarct, old Confirmed by Veryl Speak [phone]) on 08/29/2017 4:22:12 AM       Radiology No results found.  Procedures Procedures (including critical care time)  Medications Ordered in ED Medications  diltiazem (CARDIZEM) 1 mg/mL load via infusion 10 mg (not administered)    And  diltiazem (CARDIZEM) 100 mg in dextrose 5% 147mL (1 mg/mL) infusion (not administered)     Initial Impression / Assessment and Plan / ED Course  I have reviewed the triage vital signs and the nursing notes.  Pertinent labs & imaging results that were available during my care of the patient were reviewed by me and considered in my medical decision making (see chart for details).  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  She also has a history of hypertension and hypercholesterolemia, but no cardiac history.  She woke this morning with pain in her legs, weakness, and he was  found to be in atrial fibrillation with rapid ventricular response.  She arrived here by EMS with a heart rate in the 140s.  Cardiac workup was initiated and she was started on Cardizem.   After a bolus and drip of Cardizem, she subsequently converted back to a sinus rhythm.  Her laboratory studies are essentially unremarkable.  Troponin is negative, chest x-ray is clear, and EKG now reveals a sinus rhythm unchanged from previous studies.  I have discussed this case with Dr. Harrell Gave from cardiology who will have the patient evaluated by the cardiology team.  They will determine the final disposition.  Patient is a CHADS score of two and will likely require anticoagulation.  CRITICAL CARE Performed by: Veryl Speak Total critical care time: 45 minutes Critical care time was exclusive of separately billable procedures and treating other patients. Critical care was necessary to treat or prevent imminent or life-threatening deterioration. Critical care was time spent personally by me on the following activities: development of treatment plan with patient and/or surrogate as well as nursing, discussions with consultants, evaluation of patient's response to treatment, examination of patient, obtaining history from patient or surrogate, ordering and performing treatments and interventions, ordering and review of laboratory studies, ordering and review of radiographic studies, pulse oximetry and re-evaluation of patient's condition.   Final Clinical Impressions(s) / ED Diagnoses   Final diagnoses:  None    ED Discharge Orders    None       Veryl Speak, MD 08/29/17 0630    Veryl Speak, MD 08/29/17 [phone]

## 2017-08-29 NOTE — Op Note (Signed)
    OPERATIVE REPORT  DATE OF SURGERY: 08/29/2017  PATIENT: Crystal [name], 72 y.o. female MRN: [id number]  DOB: 30-Jan-1946  PRE-OPERATIVE DIAGNOSIS: Right SFA occlusion  POST-OPERATIVE DIAGNOSIS: Chronic right SFA occlusion  PROCEDURE: Right femoral exploration  SURGEON:  Curt Jews, M.D.  PHYSICIAN ASSISTANT: Nurse  ANESTHESIA: General  EBL: Minimal ml  Total I/O In: 250 [IV Piggyback:250] Out: -   BLOOD ADMINISTERED: None  DRAINS: None  SPECIMEN: None  COUNTS CORRECT:  YES  PLAN OF CARE: PACU  PATIENT DISPOSITION:  PACU - hemodynamically stable  PROCEDURE DETAILS: The patient presented with new onset atrial fibrillation and complaints of bilateral 10 out of 10 lower extremity pain.  She had recent documentation of palpable pedal pulses.  On physical exam she had a easily palpable dorsalis pedis pulse on the left than on the right.  Her pain completely resolved and she had a warm foot with no evidence of ischemia.  Her ankle arm index was 0.59 on the right and normal on the left.  Duplex ultrasound showed what appeared to be acute thrombus in the superficial femoral artery and was recommended that she undergo exploration for this.  I did explain that it is was possible at this may be chronic.  The right groin and right leg were prepped and draped in usual sterile fashion.  An incision was made over the common femoral artery pulse and carried down to isolate the common superficial femoral and profundus femoris arteries.  Patient was given 5000 units of intravenous heparin.  After adequate circulation time the common superficial femoral and profundus femoris arteries were occluded.  The common femoral artery was opened transversely.  A 4 Fogarty catheter was passed down the superficial femoral artery and would only go approximately 10-15 cm before it reached a chronic occlusion.  Further attempts were not successful passing this.  The Fogarty was passed down the deep femoral  artery and there was good backbleeding but no thrombus removed.  There was no thrombus proximally.  The incision in the artery was closed with interrupted 6-0 Prolene sutures.  Clamps were removed with flow restored to the deep femoral artery.  The patient's heparin was not reversed.  Patient was wound was irrigated with saline was closed with 3-0 Vicryl in the subcutaneous and subcuticular tissue.  Sterile dressing was applied and the patient was transferred to the recovery room in stable condition   Rosetta Posner, M.D., [facility] 08/29/2017 3:48 PM

## 2017-08-29 NOTE — ED Provider Notes (Signed)
ABI done by cards concerning for an acute SFO occlusion.  Patient started on heparin.  Admission.     Deno Etienne, DO 08/29/17 1237

## 2017-08-29 NOTE — Progress Notes (Signed)
ANTICOAGULATION CONSULT NOTE - Follow Up Consult  Pharmacy Consult for apixaban Indication: atrial fibrillation  Allergies  Allergen Reactions  . Simvastatin     myalgias  . Prednisone Hives    Patient Measurements: Height: 5\' 4"  (162.6 cm) Weight: 112 lb (50.8 kg) IBW/kg (Calculated) : 54.7  Vital Signs: Temp: 97.7 F (36.5 C) (03/18 1559) BP: 112/96 (03/18 1617) Pulse Rate: 82 (03/18 1617)  Labs: Recent Labs    08/29/17 0405  HGB 12.2  HCT 36.9  PLT 313  CREATININE 0.94    Estimated Creatinine Clearance: 42.7 mL/min (by C-G formula based on SCr of 0.94 mg/dL).  Assessment: CC/HPI: 72 yo f presenting with leg pain and afib rvr  PMH: giant cell arteritis with BL blindness, HTN, HLD, MVR, shingles  Anticoag: none pta - to apixaban for afib  CV: questionable first episode afib vs paroxysmal  Doppler show acute thrombus >> underwent R femoral exploration finding chronic right SFA occlusion. Plan to resume heparin at [place] on 3/18 per PA.  Renal: SCr 0.94  Heme/Onc: H&H 12.2/36.9, Plt 313  Plan:  Resume heparin 850 units/hr at 2000 Check heparin level in 8 hours from time of restart Check daily HL and CBC F/u plans for oral anticoagulation plans  Doylene Canard, PharmD Clinical Pharmacist  Pager: [phone] Clinical Phone for 08/29/2017 until 3:30pm: [phone] If after 3:30pm, please call main pharmacy at [phone] 08/29/2017 4:36 PM

## 2017-08-29 NOTE — Anesthesia Preprocedure Evaluation (Signed)
Anesthesia Evaluation  Patient identified by MRN, date of birth, ID band Patient awake    Reviewed: Allergy & Precautions, NPO status , Patient's Chart, lab work & pertinent test results  Airway Mallampati: I  TM Distance: >3 FB Neck ROM: Full    Dental   Pulmonary former smoker,    Pulmonary exam normal        Cardiovascular hypertension, Pt. on medications Normal cardiovascular exam     Neuro/Psych Anxiety    GI/Hepatic   Endo/Other    Renal/GU      Musculoskeletal Giant Cell Arteritis on 40mg  prednisone per day.   Abdominal   Peds  Hematology   Anesthesia Other Findings   Reproductive/Obstetrics                             Anesthesia Physical Anesthesia Plan  ASA: III  Anesthesia Plan: General   Post-op Pain Management:    Induction: Intravenous  PONV Risk Score and Plan: 3 and Ondansetron, Dexamethasone and Treatment may vary due to age or medical condition  Airway Management Planned: Oral ETT  Additional Equipment:   Intra-op Plan:   Post-operative Plan: Extubation in OR  Informed Consent: I have reviewed the patients History and Physical, chart, labs and discussed the procedure including the risks, benefits and alternatives for the proposed anesthesia with the patient or authorized representative who has indicated his/her understanding and acceptance.     Plan Discussed with: CRNA and Surgeon  Anesthesia Plan Comments:         Anesthesia Quick Evaluation

## 2017-08-29 NOTE — Progress Notes (Addendum)
ANTICOAGULATION CONSULT NOTE - Initial Consult  Pharmacy Consult for apixaban Indication: atrial fibrillation  Allergies  Allergen Reactions  . Simvastatin     myalgias  . Prednisone Hives    Patient Measurements: Height: 5\' 4"  (162.6 cm) Weight: 112 lb (50.8 kg) IBW/kg (Calculated) : 54.7  Vital Signs: Temp: 97.2 F (36.2 C) (03/18 0412) Temp Source: Oral (03/18 0412) BP: 149/71 (03/18 1000) Pulse Rate: 79 (03/18 1000)  Labs: Recent Labs    08/29/17 0405  HGB 12.2  HCT 36.9  PLT 313  CREATININE 0.94    Estimated Creatinine Clearance: 42.7 mL/min (by C-G formula based on SCr of 0.94 mg/dL).  Assessment: CC/HPI: 72 yo f presenting with leg pain and afib rvr  PMH: giant cell arteritis with BL blindness, HTN, HLD, MVR, shingles  Anticoag: none pta - to apixaban for afib  CV: questionable first episode afib vs paroxysmal  LE dopplers to r/o DVT w/ leg pain  Renal: SCr 0.94  Heme/Onc: H&H 12.2/36.9, Plt 313  Plan:  Apixaban 5 mg bid May need higher dose if dopplers positive  Levester Fresh, PharmD, BCPS, BCCCP Clinical Pharmacist Clinical phone for 08/29/2017 from 7a-3:30p: [phone] If after 3:30p, please call main pharmacy at: [phone] 08/29/2017 10:27 AM    Addendum  Doppler shows acute thrombus  New plan: Dc planned apixaban Heparin 3000 units x 1 Heparin infusion 850 units/hr Initial lvl 2000 Daily hep lvl cbc F/u oral plans

## 2017-08-29 NOTE — Progress Notes (Signed)
VASCULAR LAB PRELIMINARY  ARTERIAL  ABI completed: Right ABI indicates moderate to severe reduction in arterial blood flow with abnormal great toe pressure.  Left ABI is within normal limits.     RIGHT    LEFT    PRESSURE WAVEFORM  PRESSURE WAVEFORM  BRACHIAL 135 T BRACHIAL 131 T  DP   DP    AT 80 B AT 142 T  PT 75 B PT 142 T  PER   PER    GREAT TOE 51 NA GREAT TOE 91 NA    RIGHT LEFT  ABI 0.59 1.05     Matina Rodier, RVT 08/29/2017, 12:46 PM

## 2017-08-29 NOTE — Consult Note (Addendum)
Cardiology Consultation:   Patient ID: Crystal [name]; [id number]; 12-20-1945   Admit date: 08/29/2017 Date of Consult: 08/29/2017  Primary Care Provider: [name], MD Primary Cardiologist: new - Dr. Stanford Breed Primary Electrophysiologist:     Patient Profile:   Crystal [name] is a 72 y.o. 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 Stark Jock.  History of Present Illness:   Crystal [name] was recently diagnosed with giant cell arteritis (Feb 2019) which has resulted in blindness in both eyes. She is on prednisone taper. She subsequently developed shingles and is on valtrex.  Crystal [name] presented to [facility] with leg pain and palpitations. EMS was called and she was found to be in Afib RVR en route. On arrival to the ED, diltiazem bolus 10 mg and drip was initiated. Diltiazem drip titrated to 10 mg/hr and she converted to NSR.   On my interview her husband is at bedside. They lost their youngest son three years prior and since then she has struggled with anxiety and panic attacks. During these episodes, she felt palpitations. She denies chest pain and shortness of breath. The patient and her medical providers associated these palpitations with anxiety. Since losing her vision, she has had an increase in anxiety and intermittent palpitations. Last night, foot and leg pain work her from sleep. This caused anxiety and palpitations resurfaced. Subsequently found to be in Afib RVR, as above. She is now in sinus rhythm in the 70s with an elevated pressure in the 150s. She again denies chest pain, shortness of breath, dizziness, near-syncope, recent illness and fevers. She has been taking lasix "for years" for her asthma. She does get lower extremity swelling with she doesn't take lasix or eats a salty meal. She was diagnosed with mitral valve prolapse in the 1980s,  per the patient, but is not following with cardiology at this time. Of note, she had a negative stress test 2009, which was completed for palpitations and rapid heart rate.  Her leg pain has resolved since being in the ER. She is no longer feeling palpitations now in sinus rhythm.   Past Medical History:  Diagnosis Date  . Adenomatous colon polyp 2007 & 2010    Dr [name]  . Allergy    seasonal  . Arthritis   . Cataract   . Diverticulosis   . Hyperlipidemia   . Hypertension   . IBS (irritable bowel syndrome)   . MVP (mitral valve prolapse)   . Peripheral vascular disease ([place])   . RAD (reactive airway disease)     Past Surgical History:  Procedure Laterality Date  . ABDOMINAL HYSTERECTOMY     with USO for dysfunctionall menses  . APPENDECTOMY    . ARTERY BIOPSY Bilateral 07/21/2017   Procedure: BIOPSY BILATERAL TEMPORAL ARTERY;  Surgeon: [name], MD;  Location: [facility];  Service: Vascular;  Laterality: Bilateral;  . CATARACT EXTRACTION W/ INTRAOCULAR LENS IMPLANT  2013   bilateral; Dr [name]  . COLONOSCOPY  2013   negative , due 2018; Dr [name]  . COLONOSCOPY W/ POLYPECTOMY      X2 ; diverticulosis  . LUMBAR LAMINECTOMY  2000   Dr Vertell Limber  . SHOULDER SURGERY     R shoulder  . TONSILLECTOMY       Home Medications:  Prior to Admission medications   Medication  Sig Start Date End Date Taking? Authorizing Provider  acetaminophen (TYLENOL) 500 MG tablet Take 1,000 mg by mouth every 6 (six) hours as needed for headache.    [provider]  aspirin 325 MG tablet Take 325 mg by mouth as needed for mild pain.    [provider]  Calcium Carbonate-Vitamin D (CALCIUM 600/VITAMIN D PO) Take 1 tablet by mouth daily.     [provider]  Cholecalciferol (VITAMIN D3) 1000 UNITS CAPS Take 3,000 Units by mouth daily with lunch.     [provider]  escitalopram (LEXAPRO) 10 MG tablet Take 1 tablet (10 mg total) by mouth daily. 07/23/17   Patrecia Pour, Christean Grief, MD  furosemide (LASIX) 40 MG tablet TAKE ONE TABLET BY MOUTH DAILY 08/08/17   [name], MD  LORazepam (ATIVAN) 0.5 MG tablet Take 1 tablet (0.5 mg total) by mouth at bedtime. 07/25/17   [name], MD  losartan (COZAAR) 50 MG tablet TAKE 0.5 TABLETS (25 MG TOTAL) BY MOUTH DAILY. 08/19/17   Burns, Claudina Lick, MD  montelukast (SINGULAIR) 10 MG tablet TAKE ONE TABLET BY MOUTH DAILY Patient taking differently: TAKE ONE TABLET BY MOUTH in the evening 07/11/17   [name], MD  oxyCODONE-acetaminophen (PERCOCET/ROXICET) 5-325 MG tablet Take 1 tablet by mouth every 6 (six) hours as needed for severe pain. 07/25/17   [name], MD  potassium chloride SA (K-DUR,KLOR-CON) 20 MEQ tablet Take 2 tablets (40 mEq total) by mouth daily. 07/26/17   [name], MD  pravastatin (PRAVACHOL) 40 MG tablet TAKE 1 TABLET DAILY BY MOUTH AT BEDTIME Patient taking differently: TAKE 40mg  BY MOUTH AT BEDTIME 07/11/17   [name], MD  predniSONE (DELTASONE) 20 MG tablet Take 3 tablets (60 mg total) by mouth daily. 07/22/17   Doreatha Lew, MD  Probiotic Product (ALIGN) 4 MG CAPS Take 4 mg by mouth daily.     [provider]  SYMBICORT 160-4.5 MCG/ACT inhaler INHALE 1 TO 2 PUFFS BY MOUTH EVERY 12 HOURS THEN GARGLE AND SPIT AFTER USE 07/27/16   [name], MD  theophylline (UNIPHYL) 400 MG 24 hr tablet TAKE 1 TABLET (400 MG TOTAL) BY MOUTH DAILY. Patient taking differently: Take 200 mg by mouth 2 (two) times daily.  07/04/17   [name], MD  traMADol (ULTRAM) 50 MG tablet Take 1-2 tablets (50-100 mg total) by mouth every 8 (eight) hours as needed. 07/27/17   [name], MD  trimethoprim (TRIMPEX) 100 MG tablet Take 100 mg by mouth as needed (bladder problems during travel).  09/15/11   [provider]  valACYclovir (VALTREX) 1000 MG tablet Take 1 tablet (1,000 mg total) by mouth 3 (three) times daily. 08/08/17   [name], MD  VENTOLIN HFA 108 (90 Base) MCG/ACT  inhaler Inhale 1-2 puffs into the lungs every 6 (six) hours as needed for wheezing or shortness of breath. 06/03/17   [name], MD    Inpatient Medications: Scheduled Meds:  Continuous Infusions: . diltiazem (CARDIZEM) infusion Stopped (08/29/17 0650)   PRN Meds:   Allergies:    Allergies  Allergen Reactions  . Simvastatin     myalgias  . Prednisone Hives    Social History:   Social History   Socioeconomic History  . Marital status: Married    Spouse name: Not on file  . Number of children: Not on file  . Years of education: Not on file  . Highest  education level: Not on file  Social Needs  . Financial resource strain: Not on file  . Food insecurity - worry: Not on file  . Food insecurity - inability: Not on file  . Transportation needs - medical: Not on file  . Transportation needs - non-medical: Not on file  Occupational History  . Occupation: retired  Tobacco Use  . Smoking status: Former Smoker    Packs/day: 1.00    Years: 23.00    Pack years: 23.00    Last attempt to quit: 06/14/1989    Years since quitting: 28.2  . Smokeless tobacco: Never Used  . Tobacco comment: smoked 1961-1991 , up to < 1 ppd  Substance and Sexual Activity  . Alcohol use: Yes    Alcohol/week: 8.4 oz    Types: 14 Glasses of wine per week    Comment: Wine  . Drug use: No  . Sexual activity: Not on file  Other Topics Concern  . Not on file  Social History Narrative  . Not on file    Family History:    Family History  Problem Relation Age of Onset  . Heart attack Brother 32  . Parkinsonism Mother   . Stroke Mother   . Heart attack Father        pre 60  . Diabetes Sister   . COPD Sister        X 3  . Other Son        suicide 2016  . Colon cancer Neg Hx   . Cancer Neg Hx   . Esophageal cancer Neg Hx   . Liver cancer Neg Hx   . Pancreatic cancer Neg Hx   . Rectal cancer Neg Hx   . Stomach cancer Neg Hx      ROS:  Please see the history of present illness.    All other ROS reviewed and negative.     Physical Exam/Data:   Vitals:   08/29/17 0414 08/29/17 0430 08/29/17 0500 08/29/17 0515  BP:  128/75 118/67 102/66  Pulse:  79 (!) 144 64  Resp:  14 19 14   Temp:      TempSrc:      SpO2:  98% 96% 97%  Weight: 112 lb (50.8 kg)     Height: 5\' 4"  (1.626 m)      No intake or output data in the 24 hours ending 08/29/17 0742 Filed Weights   08/29/17 0414  Weight: 112 lb (50.8 kg)   Body mass index is 19.22 kg/m.  General:  Well nourished, well developed, in no acute distress HEENT: normal Neck: no JVD Vascular: No carotid bruits Cardiac:  normal S1, S2; RRR; no murmur Lungs:  clear to auscultation bilaterally, no wheezing, rhonchi or rales  Abd: soft, nontender, no hepatomegaly  Ext: trace edema Musculoskeletal:  No deformities, BUE and BLE strength normal and equal Skin: warm and dry  Neuro:  CNs 2-12 intact, no focal abnormalities noted Psych:  Normal affect   EKG:  The EKG was personally reviewed and demonstrates:  Sinus rhythm Telemetry:  Telemetry was personally reviewed and demonstrates:  Afib RVR at 0400, converted to sinus at approximately 0531.  Relevant CV Studies:  none  Laboratory Data:  Chemistry Recent Labs  Lab 08/29/17 0405  NA 134*  K 4.1  CL 97*  CO2 20*  GLUCOSE 89  BUN 14  CREATININE 0.94  CALCIUM 9.2  GFRNONAA 59*  GFRAA >60  ANIONGAP 17*    Recent Labs  Lab 08/29/17 0405  PROT 6.1*  ALBUMIN 3.7  AST 18  ALT 17  ALKPHOS 49  BILITOT 0.8   Hematology Recent Labs  Lab 08/29/17 0405  WBC 10.4  RBC 3.60*  HGB 12.2  HCT 36.9  MCV 102.5*  MCH 33.9  MCHC 33.1  RDW 18.7*  PLT 313   Cardiac EnzymesNo results for input(s): TROPONINI in the last 168 hours.  Recent Labs  Lab 08/29/17 0427  TROPIPOC 0.08    BNPNo results for input(s): BNP, PROBNP in the last 168 hours.  DDimer No results for input(s): DDIMER in the last 168 hours.  Radiology/Studies:  Dg Chest Port 1  View  Result Date: 08/29/2017 CLINICAL DATA:  Palpitations.  Shortness of breath. EXAM: PORTABLE CHEST 1 VIEW COMPARISON:  Chest radiograph 06/09/2010 FINDINGS: The cardiomediastinal contours are normal. The lungs are clear. Pulmonary vasculature is normal. No consolidation, pleural effusion, or pneumothorax. No acute osseous abnormalities are seen. IMPRESSION: No acute pulmonary process. Electronically Signed   By: Jeb Levering M.D.   On: 08/29/2017 04:42    Assessment and Plan:   1. Atrial fibrillation with RVR EKG with Afib with ventricular rate 132 bpm. Converted to NSR with cardizem. Cardizem drip has since been D/C'ed. Given her history of palpitations over the past three years, it is difficult to know if this is her first occurrence of Afib or if she has had paroxysmal Afib. She responded well to the cardizem. Will transition to cardizem to 180 mg daily. Uptitrate as needed.  This patients CHA2DS2-VASc Score and unadjusted Ischemic Stroke Rate (% per year) is equal to 3.2 % stroke rate/year from a score of 3 (age, female, HTN). Will start eliquis since there is questionable history of paroxysmal atrial fibrillation.  TSH WNL. K 4.1. Mg pending. Will obtain echocardiogram for structure and function, to evaluate mitral valve prolapse, source of lower extremity swelling.    2. Lower extremity pain - left It is unclear if this may be related to her GCA. However, sill obtain lower extremity doppler to rule out DVT in the setting of suspected paroxysmal atrial fibrillation.   3. HTN Home regimen includes losartan 25 mg daily. Start diltiazem as above and discontinue cozaar. Echo will guide medication selection.    4. Giant cell arteritis complicated by bilateral blindness On prednisone taper, now on 40 mg daily.     For questions or updates, please contact [place] Please consult [email or web address] for contact info under Cardiology/STEMI.   Signed, Tami Lin Duke, PA   08/29/2017 7:42 AM As above, patient seen and examined.  Briefly she is a 72 year old female with recently diagnosed giant cell arteritis causing blindness, hypertension, hyperlipidemia, mitral valve prolapse for evaluation of atrial fibrillation with rapid ventricular response.  Patient has recently been diagnosed in February of this year with giant cell arteritis.  This caused bilateral blindness.  She awoke from sleep last evening with severe bilateral lower extremity pain.  It began in her feet and radiated towards her knees.  There was no palpitations, dyspnea, chest pain or worsening headache.  Her pain persisted and EMS was called.  She was also found to be in atrial fibrillation with rapid ventricular response and was transported to the emergency room.  Here her pain resolved in her legs spontaneously.  She converted to sinus rhythm with Cardizem.  Cardiology is now asked to evaluate.  On exam she has 2+ radial, carotid, femoral and right dorsalis pedis pulse.  Her left dorsalis pedis pulse  is not palpated.  Both feet are warm. Electrocardiogram shows atrial fibrillation with rapid ventricular response.  Follow-up electrocardiogram showed sinus rhythm with PACs and prior septal infarct cannot be excluded.  TSH is normal.  Creatinine is 0.94.   1 paroxysmal atrial fibrillation-patient has had intermittent palpitations in the past that may have well represented atrial fibrillation.  She converted with Cardizem.  TSH is normal.  Would continue Cardizem CD 180 mg daily long-term for rate control if atrial fibrillation recurs.  If she has more frequent episodes in the future we can consider an antiarrhythmic. CHADSvasc 2.  Would treat with apixaban 5 mg twice daily long-term.  Discontinue aspirin.  Schedule outpatient echocardiogram and then follow-up in 1-2 weeks.    2 giant cell arteritis-continue prednisone taper per rheumatology.  3 bilateral lower extremity pain-this was the original complaint that  caused her to present to the emergency room.  It has completely resolved.  Question if this is related to giant cell arteritis.  She has 2+ femoral pulses bilaterally, 2+ dorsalis pedis on the right and 1+ on the left.  We will check ABIs with Doppler.  I have considered an embolic event to her lower extremities as a cause given atrial fibrillation at time of presentation.  However this seems less likely given that her pain is bilateral and also has completely resolved.  We are also treating with apixaban.  Her symptoms also do not seem consistent with dissection.  4 hypertension-given addition of Cardizem I will discontinue ARB.  If ABIs with Doppler are unremarkable patient can be discharged with outpatient follow-up from a cardiac standpoint.  [name], MD

## 2017-08-29 NOTE — ED Notes (Signed)
Patient transported to Ultrasound 

## 2017-08-29 NOTE — Progress Notes (Signed)
New Admission Note: Pt transferred from the [facility] PACU to room 5M12  Arrival Method: via stretcher Mental Orientation: Alert and oriented x 4 Telemetry: Placed on box 14 Assessment: Completed Skin: Ecchymosis throughout, Healing lesions to buttocks IV: R arm NSL Pain: Denies Tubes: None Safety Measures: Safety Fall Prevention Plan has been discussed  Admission: to be completed [address] [place] Orientation: Patient has been orientated to the room, unit and staff.  Family: None at bedside  Orders to be reviewed and implemented. Will continue to monitor the patient. Call light has been placed within reach and bed alarm has been activated.   Jasmina Cvijetic, RN Phone:

## 2017-08-29 NOTE — ED Notes (Signed)
Pt returned from [place]. Pharmacy called for heparin gtt to be sent

## 2017-08-29 NOTE — Anesthesia Procedure Notes (Signed)
Procedure Name: Intubation Date/Time: 08/29/2017 2:47 PM Performed by: Masaki Rothbauer T, CRNA Pre-anesthesia Checklist: Patient identified, Emergency Drugs available, Suction available and Patient being monitored Patient Re-evaluated:Patient Re-evaluated prior to induction Oxygen Delivery Method: Circle system utilized Preoxygenation: Pre-oxygenation with 100% oxygen Induction Type: IV induction Ventilation: Mask ventilation without difficulty Laryngoscope Size: Miller and 2 Grade View: Grade I Tube type: Oral Tube size: 7.5 mm Number of attempts: 1 Airway Equipment and Method: Patient positioned with wedge pillow and Stylet Placement Confirmation: ETT inserted through vocal cords under direct vision,  positive ETCO2 and breath sounds checked- equal and bilateral Secured at: 21 cm Tube secured with: Tape Dental Injury: Teeth and Oropharynx as per pre-operative assessment

## 2017-08-29 NOTE — Transfer of Care (Signed)
Immediate Anesthesia Transfer of Care Note  Patient: Crystal [name]  Procedure(s) Performed: Right Groin Exploration   (Right Groin)  Patient Location: PACU  Anesthesia Type:General  Level of Consciousness: awake, alert  and oriented  Airway & Oxygen Therapy: Patient Spontanous Breathing and Patient connected to nasal cannula oxygen  Post-op Assessment: Report given to RN and Post -op Vital signs reviewed and stable  Post vital signs: Reviewed and stable  Last Vitals:  Vitals:   08/29/17 1559 08/29/17 1602  BP:  133/63  Pulse:  81  Resp:  19  Temp: 36.5 C   SpO2:  100%    Last Pain:  Vitals:   08/29/17 1559  TempSrc:   PainSc: 0-No pain         Complications: No apparent anesthesia complications

## 2017-08-29 NOTE — Progress Notes (Signed)
Dr. Donnetta Hutching notified that patient is on Heparin drip and gave verbal orders to continue Heparin.

## 2017-08-29 NOTE — Progress Notes (Signed)
Vascular lab for arterial and venous duplex revealed SFA occlusion on the right with acute thrombus. Eliquis D/C'ed and heparin drip started. Awaiting consult from vascular surgery.   Tami Lin Duke, PA-C 08/29/2017, 1:52 PM [phone] [facility]

## 2017-08-29 NOTE — H&P (Addendum)
Cardiology History and Physical:   Patient ID: Crystal [name]; [id number]; 1945/06/27   Admit date: 08/29/2017 Date of Consult: 08/29/2017  Primary Care Provider: [name], MD Primary Cardiologist: new - Dr. Stanford Breed Primary Electrophysiologist:     Patient Profile:   Crystal [name] is a 72 y.o. 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 Stark Jock.  History of Present Illness:   Crystal [name] was recently diagnosed with giant cell arteritis (Feb 2019) which has resulted in blindness in both eyes. She is on prednisone taper. She subsequently developed shingles and is on valtrex.  Crystal [name] presented to [facility] with leg pain and palpitations. EMS was called and she was found to be in Afib RVR en route. On arrival to the ED, diltiazem bolus 10 mg and drip was initiated. Diltiazem drip titrated to 10 mg/hr and she converted to NSR.   On my interview her husband is at bedside. They lost their youngest son three years prior and since then she has struggled with anxiety and panic attacks. During these episodes, she felt palpitations. She denies chest pain and shortness of breath. The patient and her medical providers associated these palpitations with anxiety. Since losing her vision, she has had an increase in anxiety and intermittent palpitations. Last night, foot and leg pain work her from sleep. This caused anxiety and palpitations resurfaced. Subsequently found to be in Afib RVR, as above. She is now in sinus rhythm in the 70s with an elevated pressure in the 150s. She again denies chest pain, shortness of breath, dizziness, near-syncope, recent illness and fevers. She has been taking lasix "for years" for her asthma. She does get lower extremity swelling with she doesn't take lasix or eats a salty meal. She was diagnosed with mitral valve prolapse in the  1980s, per the patient, but is not following with cardiology at this time. Of note, she had a negative stress test 2009, which was completed for palpitations and rapid heart rate.  Her leg pain has resolved since being in the ER. She is no longer feeling palpitations now in sinus rhythm.   Past Medical History:  Diagnosis Date  . Adenomatous colon polyp 2007 & 2010    Dr [name]  . Allergy    seasonal  . Arthritis   . Cataract   . Diverticulosis   . Hyperlipidemia   . Hypertension   . IBS (irritable bowel syndrome)   . MVP (mitral valve prolapse)   . Peripheral vascular disease ([place])   . RAD (reactive airway disease)     Past Surgical History:  Procedure Laterality Date  . ABDOMINAL HYSTERECTOMY     with USO for dysfunctionall menses  . APPENDECTOMY    . ARTERY BIOPSY Bilateral 07/21/2017   Procedure: BIOPSY BILATERAL TEMPORAL ARTERY;  Surgeon: [name], MD;  Location: [facility];  Service: Vascular;  Laterality: Bilateral;  . CATARACT EXTRACTION W/ INTRAOCULAR LENS IMPLANT  2013   bilateral; Dr [name]  . COLONOSCOPY  2013   negative , due 2018; Dr [name]  . COLONOSCOPY W/ POLYPECTOMY      X2 ; diverticulosis  . LUMBAR LAMINECTOMY  2000   Dr Vertell Limber  . SHOULDER SURGERY     R shoulder  . TONSILLECTOMY       Home Medications:  Prior to Admission medications  Medication Sig Start Date End Date Taking? Authorizing Provider  acetaminophen (TYLENOL) 500 MG tablet Take 1,000 mg by mouth every 6 (six) hours as needed for headache.    [provider]  aspirin 325 MG tablet Take 325 mg by mouth as needed for mild pain.    [provider]  Calcium Carbonate-Vitamin D (CALCIUM 600/VITAMIN D PO) Take 1 tablet by mouth daily.     [provider]  Cholecalciferol (VITAMIN D3) 1000 UNITS CAPS Take 3,000 Units by mouth daily with lunch.     [provider]  escitalopram (LEXAPRO) 10 MG tablet Take 1 tablet (10 mg total) by mouth daily. 07/23/17    Patrecia Pour, Christean Grief, MD  furosemide (LASIX) 40 MG tablet TAKE ONE TABLET BY MOUTH DAILY 08/08/17   [name], MD  LORazepam (ATIVAN) 0.5 MG tablet Take 1 tablet (0.5 mg total) by mouth at bedtime. 07/25/17   [name], MD  losartan (COZAAR) 50 MG tablet TAKE 0.5 TABLETS (25 MG TOTAL) BY MOUTH DAILY. 08/19/17   Burns, Claudina Lick, MD  montelukast (SINGULAIR) 10 MG tablet TAKE ONE TABLET BY MOUTH DAILY Patient taking differently: TAKE ONE TABLET BY MOUTH in the evening 07/11/17   [name], MD  oxyCODONE-acetaminophen (PERCOCET/ROXICET) 5-325 MG tablet Take 1 tablet by mouth every 6 (six) hours as needed for severe pain. 07/25/17   [name], MD  potassium chloride SA (K-DUR,KLOR-CON) 20 MEQ tablet Take 2 tablets (40 mEq total) by mouth daily. 07/26/17   [name], MD  pravastatin (PRAVACHOL) 40 MG tablet TAKE 1 TABLET DAILY BY MOUTH AT BEDTIME Patient taking differently: TAKE 40mg  BY MOUTH AT BEDTIME 07/11/17   [name], MD  predniSONE (DELTASONE) 20 MG tablet Take 3 tablets (60 mg total) by mouth daily. 07/22/17   Doreatha Lew, MD  Probiotic Product (ALIGN) 4 MG CAPS Take 4 mg by mouth daily.     [provider]  SYMBICORT 160-4.5 MCG/ACT inhaler INHALE 1 TO 2 PUFFS BY MOUTH EVERY 12 HOURS THEN GARGLE AND SPIT AFTER USE 07/27/16   [name], MD  theophylline (UNIPHYL) 400 MG 24 hr tablet TAKE 1 TABLET (400 MG TOTAL) BY MOUTH DAILY. Patient taking differently: Take 200 mg by mouth 2 (two) times daily.  07/04/17   [name], MD  traMADol (ULTRAM) 50 MG tablet Take 1-2 tablets (50-100 mg total) by mouth every 8 (eight) hours as needed. 07/27/17   [name], MD  trimethoprim (TRIMPEX) 100 MG tablet Take 100 mg by mouth as needed (bladder problems during travel).  09/15/11   [provider]  valACYclovir (VALTREX) 1000 MG tablet Take 1 tablet (1,000 mg total) by mouth 3 (three) times daily. 08/08/17   [name], MD  VENTOLIN HFA 108 (90 Base)  MCG/ACT inhaler Inhale 1-2 puffs into the lungs every 6 (six) hours as needed for wheezing or shortness of breath. 06/03/17   [name], MD    Inpatient Medications: Scheduled Meds: . [MAR Hold] diltiazem  180 mg Oral Daily   Continuous Infusions: . ceFAZolin    . heparin 850 Units/hr (08/29/17 1310)   PRN Meds:   Allergies:    Allergies  Allergen Reactions  . Simvastatin     myalgias  . Prednisone Hives    Social History:   Social History   Socioeconomic History  . Marital status: Married    Spouse name: Not on file  . Number of  children: Not on file  . Years of education: Not on file  . Highest education level: Not on file  Social Needs  . Financial resource strain: Not on file  . Food insecurity - worry: Not on file  . Food insecurity - inability: Not on file  . Transportation needs - medical: Not on file  . Transportation needs - non-medical: Not on file  Occupational History  . Occupation: retired  Tobacco Use  . Smoking status: Former Smoker    Packs/day: 1.00    Years: 23.00    Pack years: 23.00    Last attempt to quit: 06/14/1989    Years since quitting: 28.2  . Smokeless tobacco: Never Used  . Tobacco comment: smoked 1961-1991 , up to < 1 ppd  Substance and Sexual Activity  . Alcohol use: Yes    Alcohol/week: 8.4 oz    Types: 14 Glasses of wine per week    Comment: Wine  . Drug use: No  . Sexual activity: Not on file  Other Topics Concern  . Not on file  Social History Narrative  . Not on file    Family History:    Family History  Problem Relation Age of Onset  . Heart attack Brother 18  . Parkinsonism Mother   . Stroke Mother   . Heart attack Father        pre 61  . Diabetes Sister   . COPD Sister        X 3  . Other Son        suicide 2016  . Colon cancer Neg Hx   . Cancer Neg Hx   . Esophageal cancer Neg Hx   . Liver cancer Neg Hx   . Pancreatic cancer Neg Hx   . Rectal cancer Neg Hx   . Stomach cancer Neg Hx      ROS:    Please see the history of present illness.   All other ROS reviewed and negative.     Physical Exam/Data:   Vitals:   08/29/17 1215 08/29/17 1245 08/29/17 1300 08/29/17 1315  BP: 131/63 122/64 (!) 146/66 129/77  Pulse: 70 67 69   Resp: 16 14 (!) 21 18  Temp:      TempSrc:      SpO2: 97% 94% 97%   Weight:      Height:       No intake or output data in the 24 hours ending 08/29/17 1350 Filed Weights   08/29/17 0414  Weight: 112 lb (50.8 kg)   Body mass index is 19.22 kg/m.  General:  Well nourished, well developed, in no acute distress HEENT: normal Neck: no JVD Vascular: No carotid bruits Cardiac:  normal S1, S2; RRR; no murmur Lungs:  clear to auscultation bilaterally, no wheezing, rhonchi or rales  Abd: soft, nontender, no hepatomegaly  Ext: trace edema Musculoskeletal:  No deformities, BUE and BLE strength normal and equal Skin: warm and dry  Neuro:  CNs 2-12 intact, no focal abnormalities noted Psych:  Normal affect   EKG:  The EKG was personally reviewed and demonstrates:  Sinus rhythm Telemetry:  Telemetry was personally reviewed and demonstrates:  Afib RVR at 0400, converted to sinus at approximately 0531.  Relevant CV Studies:  none  Laboratory Data:  Chemistry Recent Labs  Lab 08/29/17 0405  NA 134*  K 4.1  CL 97*  CO2 20*  GLUCOSE 89  BUN 14  CREATININE 0.94  CALCIUM 9.2  GFRNONAA 59*  GFRAA >60  ANIONGAP 17*    Recent Labs  Lab 08/29/17 0405  PROT 6.1*  ALBUMIN 3.7  AST 18  ALT 17  ALKPHOS 49  BILITOT 0.8   Hematology Recent Labs  Lab 08/29/17 0405  WBC 10.4  RBC 3.60*  HGB 12.2  HCT 36.9  MCV 102.5*  MCH 33.9  MCHC 33.1  RDW 18.7*  PLT 313   Cardiac EnzymesNo results for input(s): TROPONINI in the last 168 hours.  Recent Labs  Lab 08/29/17 0427  TROPIPOC 0.08    BNPNo results for input(s): BNP, PROBNP in the last 168 hours.  DDimer No results for input(s): DDIMER in the last 168 hours.  Radiology/Studies:  Dg  Chest Port 1 View  Result Date: 08/29/2017 CLINICAL DATA:  Palpitations.  Shortness of breath. EXAM: PORTABLE CHEST 1 VIEW COMPARISON:  Chest radiograph 06/09/2010 FINDINGS: The cardiomediastinal contours are normal. The lungs are clear. Pulmonary vasculature is normal. No consolidation, pleural effusion, or pneumothorax. No acute osseous abnormalities are seen. IMPRESSION: No acute pulmonary process. Electronically Signed   By: Jeb Levering M.D.   On: 08/29/2017 04:42    Assessment and Plan:   1. Atrial fibrillation with RVR EKG with Afib with ventricular rate 132 bpm. Converted to NSR with cardizem. Cardizem drip has since been D/C'ed. Given her history of palpitations over the past three years, it is difficult to know if this is her first occurrence of Afib or if she has had paroxysmal Afib. She responded well to the cardizem. Will transition to cardizem to 180 mg daily. Uptitrate as needed.  This patients CHA2DS2-VASc Score and unadjusted Ischemic Stroke Rate (% per year) is equal to 3.2 % stroke rate/year from a score of 3 (age, female, HTN). Will start eliquis since there is questionable history of paroxysmal atrial fibrillation.  TSH WNL. K 4.1. Mg pending. Will obtain echocardiogram for structure and function, to evaluate mitral valve prolapse, source of lower extremity swelling.    2. Lower extremity pain - left It is unclear if this may be related to her GCA. However, sill obtain lower extremity doppler to rule out DVT in the setting of suspected paroxysmal atrial fibrillation.   3. HTN Home regimen includes losartan 25 mg daily. Start diltiazem as above and discontinue cozaar. Echo will guide medication selection.    4. Giant cell arteritis complicated by bilateral blindness On prednisone taper, now on 40 mg daily.     For questions or updates, please contact [place] Please consult [email or web address] for contact info under Cardiology/STEMI.   Signed, Tami Lin  Duke, PA  08/29/2017 1:50 PM As above, patient seen and examined.  Briefly she is a 72 year old female with recently diagnosed giant cell arteritis causing blindness, hypertension, hyperlipidemia, mitral valve prolapse for evaluation of atrial fibrillation with rapid ventricular response.  Patient has recently been diagnosed in February of this year with giant cell arteritis.  This caused bilateral blindness.  She awoke from sleep last evening with severe bilateral lower extremity pain.  It began in her feet and radiated towards her knees.  There was no palpitations, dyspnea, chest pain or worsening headache.  Her pain persisted and EMS was called.  She was also found to be in atrial fibrillation with rapid ventricular response and was transported to the emergency room.  Here her pain resolved in her legs spontaneously.  She converted to sinus rhythm with Cardizem.  Cardiology is now asked to evaluate.  On exam she has 2+ radial, carotid, femoral  and right dorsalis pedis pulse.  Her left dorsalis pedis pulse is not palpated.  Both feet are warm. Electrocardiogram shows atrial fibrillation with rapid ventricular response.  Follow-up electrocardiogram showed sinus rhythm with PACs and prior septal infarct cannot be excluded.  TSH is normal.  Creatinine is 0.94.   1 paroxysmal atrial fibrillation-patient has had intermittent palpitations in the past that may have well represented atrial fibrillation.  She converted with Cardizem.  TSH is normal.  Would continue Cardizem CD 180 mg daily long-term for rate control if atrial fibrillation recurs.  If she has more frequent episodes in the future we can consider an antiarrhythmic. CHADSvasc 2.  Would treat with apixaban 5 mg twice daily long-term.  Discontinue aspirin.  Schedule outpatient echocardiogram and then follow-up in 1-2 weeks.    2 giant cell arteritis-continue prednisone taper per rheumatology.  3 bilateral lower extremity pain-this was the original  complaint that caused her to present to the emergency room.  It has completely resolved.  Question if this is related to giant cell arteritis.  She has 2+ femoral pulses bilaterally, 2+ dorsalis pedis on the right and 1+ on the left.  We will check ABIs with Doppler.  I have considered an embolic event to her lower extremities as a cause given atrial fibrillation at time of presentation.  However this seems less likely given that her pain is bilateral and also has completely resolved.  We are also treating with apixaban.  Her symptoms also do not seem consistent with dissection.  4 hypertension-given addition of Cardizem I will discontinue ARB.  If ABIs with Doppler are unremarkable patient can be discharged with outpatient follow-up from a cardiac standpoint.  [name] MD

## 2017-08-29 NOTE — Anesthesia Postprocedure Evaluation (Signed)
Anesthesia Post Note  Patient: Crystal [name]  Procedure(s) Performed: Right Groin Exploration   (Right Groin)     Patient location during evaluation: PACU Anesthesia Type: General Level of consciousness: awake and alert Pain management: pain level controlled Vital Signs Assessment: post-procedure vital signs reviewed and stable Respiratory status: spontaneous breathing, nonlabored ventilation, respiratory function stable and patient connected to nasal cannula oxygen Cardiovascular status: blood pressure returned to baseline and stable Postop Assessment: no apparent nausea or vomiting Anesthetic complications: no    Last Vitals:  Vitals:   08/29/17 1647 08/29/17 1720  BP: 133/64 131/67  Pulse: 77 70  Resp: 16 17  Temp:  36.5 C  SpO2: 90% 92%    Last Pain:  Vitals:   08/29/17 1734  TempSrc:   PainSc: 0-No pain                 Rhayne Chatwin DAVID

## 2017-08-29 NOTE — ED Triage Notes (Signed)
Pt transported from home by [facility] for c/o severe bilat lower leg and foot pain. Pt found to be in rapid afib, pt given Metoprolol 5mg  IVP x 2 by EMS.  No hx of afib

## 2017-08-30 ENCOUNTER — Encounter (HOSPITAL_COMMUNITY): Payer: Self-pay | Admitting: Vascular Surgery

## 2017-08-30 ENCOUNTER — Telehealth: Payer: Self-pay | Admitting: Vascular Surgery

## 2017-08-30 LAB — CBC
HCT: 28.1 % — ABNORMAL LOW (ref 36.0–46.0)
HCT: 25.9 % — ABNORMAL LOW (ref 36.0–46.0)
Hemoglobin: 9.1 g/dL — ABNORMAL LOW (ref 12.0–15.0)
Hemoglobin: 8.2 g/dL — ABNORMAL LOW (ref 12.0–15.0)
MCH: 34.1 pg — ABNORMAL HIGH (ref 26.0–34.0)
MCH: 33.2 pg (ref 26.0–34.0)
MCHC: 32.4 g/dL (ref 30.0–36.0)
MCHC: 31.7 g/dL (ref 30.0–36.0)
MCV: 105.2 fL — ABNORMAL HIGH (ref 78.0–100.0)
MCV: 104.9 fL — ABNORMAL HIGH (ref 78.0–100.0)
Platelets: 214 10*3/uL (ref 150–400)
Platelets: 234 10*3/uL (ref 150–400)
RBC: 2.67 MIL/uL — ABNORMAL LOW (ref 3.87–5.11)
RBC: 2.47 MIL/uL — ABNORMAL LOW (ref 3.87–5.11)
RDW: 18.9 % — ABNORMAL HIGH (ref 11.5–15.5)
RDW: 18.9 % — ABNORMAL HIGH (ref 11.5–15.5)
WBC: 7.1 10*3/uL (ref 4.0–10.5)
WBC: 13.4 10*3/uL — ABNORMAL HIGH (ref 4.0–10.5)

## 2017-08-30 LAB — BASIC METABOLIC PANEL
Anion gap: 12 (ref 5–15)
BUN: 12 mg/dL (ref 6–20)
CO2: 23 mmol/L (ref 22–32)
Calcium: 8.1 mg/dL — ABNORMAL LOW (ref 8.9–10.3)
Chloride: 96 mmol/L — ABNORMAL LOW (ref 101–111)
Creatinine, Ser: 0.94 mg/dL (ref 0.44–1.00)
GFR calc Af Amer: >60 mL/min (ref >60)
GFR calc non Af Amer: 59 mL/min — ABNORMAL LOW (ref >60)
Glucose, Bld: 126 mg/dL — ABNORMAL HIGH (ref 65–99)
Potassium: 4.7 mmol/L (ref 3.5–5.1)
Sodium: 131 mmol/L — ABNORMAL LOW (ref 135–145)

## 2017-08-30 LAB — HEPARIN LEVEL (UNFRACTIONATED): Heparin Unfractionated: 0.86 IU/mL — ABNORMAL HIGH (ref 0.30–0.70)

## 2017-08-30 MED ORDER — POTASSIUM CHLORIDE CRYS ER 20 MEQ PO TBCR
20.0000 meq | EXTENDED_RELEASE_TABLET | Freq: Every day | ORAL | Status: DC
  Administered 2017-08-31 – 2017-09-01 (×2): 20 meq via ORAL
  Filled 2017-08-30 (×2): qty 1

## 2017-08-30 NOTE — Progress Notes (Signed)
ANTICOAGULATION CONSULT NOTE - Follow Up Consult  Pharmacy Consult for Heparin  Indication: atrial fibrillation, s/p vascular surgery procedure   Allergies  Allergen Reactions  . Simvastatin     myalgias  . Prednisone Hives    Patient Measurements: Height: 5\' 4"  (162.6 cm) Weight: 115 lb 11.9 oz (52.5 kg) IBW/kg (Calculated) : 54.7  Vital Signs: Temp: 97.9 F (36.6 C) (03/19 0941) Temp Source: Oral (03/19 0941) BP: 102/42 (03/19 0941) Pulse Rate: 67 (03/19 0941)  Labs: Recent Labs    08/29/17 0405 08/30/17 0524 08/30/17 1456  HGB 12.2 9.1* 8.2*  HCT 36.9 28.1* 25.9*  PLT 313 214 234  HEPARINUNFRC  --  0.86*  --   CREATININE 0.94 0.94  --     Estimated Creatinine Clearance: 44.2 mL/min (by C-G formula based on SCr of 0.94 mg/dL).  Assessment: CC/HPI: 72 yo f presenting with leg pain and afib rvr  PMH: giant cell arteritis with BL blindness, HTN, HLD, MVR, shingles  Anticoag: none pta - to apixaban for afib  CV: questionable first episode afib vs paroxysmal  Doppler show acute thrombus >> underwent R femoral exploration finding chronic right SFA occlusion  Found to have hematoma at surgical site that was oozing earlier today. Repeat CBC shows Hgb decreased further (12.2 pre-op, 9.1 post-op, 8.2 on repeat). Cardiology discontinued heparin infusion and plans to monitor CBC in morning. Pharmacy will follow along with plan from team.   Doylene Canard, PharmD Clinical Pharmacist  Pager: [phone] Phone: [phone]

## 2017-08-30 NOTE — Telephone Encounter (Signed)
Spoke to pt husband regarding 09/20/17 appt. Mailed letter.

## 2017-08-30 NOTE — Progress Notes (Signed)
CBC repeated this afternoon with Hb reported from lab of 8.1. Per nursing, patient is asymptomatic. She has walked to the bathroom without issues and reports no bleeding in urine.  In consultation with Dr. Stanford Breed, will D/C heparin drip for now and recheck CBC in the morning.    Tami Lin Duke, PA-C 08/30/2017, 3:52 PM [place]

## 2017-08-30 NOTE — Progress Notes (Signed)
ANTICOAGULATION CONSULT NOTE - Follow Up Consult  Pharmacy Consult for Heparin  Indication: atrial fibrillation, s/p vascular surgery procedure   Allergies  Allergen Reactions  . Simvastatin     myalgias  . Prednisone Hives    Patient Measurements: Height: 5\' 4"  (162.6 cm) Weight: 115 lb 11.9 oz (52.5 kg) IBW/kg (Calculated) : 54.7  Vital Signs: Temp: 98.6 F (37 C) (03/19 0519) Temp Source: Oral (03/19 0519) BP: 127/65 (03/19 0519) Pulse Rate: 71 (03/19 0519)  Labs: Recent Labs    08/29/17 0405 08/30/17 0524  HGB 12.2  --   HCT 36.9  --   PLT 313  --   HEPARINUNFRC  --  0.86*  CREATININE 0.94 0.94    Estimated Creatinine Clearance: 44.2 mL/min (by C-G formula based on SCr of 0.94 mg/dL).  Assessment: CC/HPI: 72 yo f presenting with leg pain and afib rvr  PMH: giant cell arteritis with BL blindness, HTN, HLD, MVR, shingles  Anticoag: none pta - to apixaban for afib  CV: questionable first episode afib vs paroxysmal  Doppler show acute thrombus >> underwent R femoral exploration finding chronic right SFA occlusion  Update 3/19 AM: initial heparin level this AM is supra-therapeutic   Plan:  Dec heparin to 650 units/hr [place], PharmD, BCPS Clinical Pharmacist Phone: [phone]

## 2017-08-30 NOTE — Progress Notes (Signed)
Progress Note  Patient Name: SHALESE STRAHAN Date of Encounter: 08/30/2017  Primary Cardiologist: [name], MD   Subjective   No chest pain or dyspnea  Inpatient Medications    Scheduled Meds: . acidophilus  1 capsule Oral Daily  . diltiazem  120 mg Oral Daily  . furosemide  40 mg Oral Daily  . gabapentin  400 mg Oral QHS  . LORazepam  0.5 mg Oral QHS  . losartan  25 mg Oral Daily  . mometasone-formoterol  2 puff Inhalation BID  . montelukast  10 mg Oral QPM  . pantoprazole  40 mg Oral Daily  . potassium chloride SA  40 mEq Oral Daily  . pravastatin  40 mg Oral q1800  . predniSONE  40 mg Oral Daily  . theophylline  200 mg Oral BID   Continuous Infusions: . sodium chloride    . heparin 650 Units/hr (08/30/17 0840)  . magnesium sulfate 1 - 4 g bolus IVPB     PRN Meds: sodium chloride, acetaminophen, albuterol, guaiFENesin-dextromethorphan, hydrALAZINE, labetalol, magnesium sulfate 1 - 4 g bolus IVPB, metoprolol tartrate, morphine injection, ondansetron (ZOFRAN) IV, phenol, potassium chloride, traMADol, trimethoprim   Vital Signs    Vitals:   08/30/17 0500 08/30/17 0519 08/30/17 0901 08/30/17 0941  BP:  127/65  (!) 102/42  Pulse:  71 64 67  Resp:  16 18 16   Temp:  98.6 F (37 C)  97.9 F (36.6 C)  TempSrc:  Oral  Oral  SpO2:  93% 91% 95%  Weight: 115 lb 11.9 oz (52.5 kg)     Height:        Intake/Output Summary (Last 24 hours) at 08/30/2017 1015 Last data filed at 08/30/2017 0941 Gross per 24 hour  Intake 1479.19 ml  Output 60 ml  Net 1419.19 ml   Filed Weights   08/29/17 0414 08/29/17 1720 08/30/17 0500  Weight: 112 lb (50.8 kg) 115 lb 11.9 oz (52.5 kg) 115 lb 11.9 oz (52.5 kg)    Telemetry    Sinus with isolated couplet - Personally Reviewed   Physical Exam   GEN: No acute distress.   Neck: No JVD Cardiac: RRR, no murmurs, rubs, or gallops.  Respiratory: Clear to auscultation bilaterally. GI: Soft, nontender, non-distended  MS: No edema;  right groin with hematoma and ecchymoses Neuro:  Nonfocal  Psych: Normal affect   Labs    Chemistry Recent Labs  Lab 08/29/17 0405 08/30/17 0524  NA 134* 131*  K 4.1 4.7  CL 97* 96*  CO2 20* 23  GLUCOSE 89 126*  BUN 14 12  CREATININE 0.94 0.94  CALCIUM 9.2 8.1*  PROT 6.1*  --   ALBUMIN 3.7  --   AST 18  --   ALT 17  --   ALKPHOS 49  --   BILITOT 0.8  --   GFRNONAA 59* 59*  GFRAA >60 >60  ANIONGAP 17* 12     Hematology Recent Labs  Lab 08/29/17 0405 08/30/17 0524  WBC 10.4 7.1  RBC 3.60* 2.67*  HGB 12.2 9.1*  HCT 36.9 28.1*  MCV 102.5* 105.2*  MCH 33.9 34.1*  MCHC 33.1 32.4  RDW 18.7* 18.9*  PLT 313 214    Recent Labs  Lab 08/29/17 0427  TROPIPOC 0.08      Radiology    Dg Chest Port 1 View  Result Date: 08/29/2017 CLINICAL DATA:  Palpitations.  Shortness of breath. EXAM: PORTABLE CHEST 1 VIEW COMPARISON:  Chest radiograph 06/09/2010 FINDINGS: The  cardiomediastinal contours are normal. The lungs are clear. Pulmonary vasculature is normal. No consolidation, pleural effusion, or pneumothorax. No acute osseous abnormalities are seen. IMPRESSION: No acute pulmonary process. Electronically Signed   By: Jeb Levering M.D.   On: 08/29/2017 04:42    Patient Profile     72 y.o. 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  Assessment & Plan    1 paroxysmal atrial fibrillation-patient remains in sinus this morning.  We will continue with present dose of Cardizem for rate control if atrial fibrillation recurs.  We can consider addition of an antiarrhythmic if she has more frequent episodes in the future.  Await echocardiogram.  She will need long-term anticoagulation with apixaban as outlined in previous note.  Given recent surgery and hematoma I will  continue heparin for now but transition to apixaban at discharge.  2 status post right groin exploration-apparently SFA occlusion was chronic.  Patient has a hematoma at surgical site.  Hemoglobin decreased from 12.2-9.1.  There is minimal oozing from surgical site.  We will follow today and repeat hemoglobin tomorrow morning.  Transfuse as needed.  If stable tomorrow morning she could likely be discharged.  If there is further bleeding we may need to hold heparin.  3 giant cell arteritis-continue preadmission dose of prednisone.  I do not think she needs stress dose steroids at this point.  4 hypertension-blood pressure is borderline.  We will discontinue Cozaar.  Continue Cardizem and Lasix.  Follow blood pressure and adjust regimen as needed.  5 acute blood loss anemia-plan as outlined under #2.  For questions or updates, please contact [place] Please consult [email or web address] for contact info under Cardiology/STEMI.      Signed, [name], MD  08/30/2017, 10:15 AM

## 2017-08-30 NOTE — Telephone Encounter (Signed)
-----   Message from Mena Goes, RN sent at 08/30/2017  9:52 AM EDT ----- Regarding: 2-3 weeks post op    ----- Message ----- From: Ulyses Amor, PA-C Sent: 08/30/2017   8:45 AM To: [facility] Charge Pool  DX: Right SFA occlusion s/p exploration of right femoral artery f/u with Dr. Donnetta Hutching in 2-3 weeks.  No labs needed.

## 2017-08-30 NOTE — Progress Notes (Signed)
Subjective: Interval History: none.. Comfortable this morning.  No further pain in lower extremities  Objective: Vital signs in last 24 hours: Temp:  [97.6 F (36.4 C)-98.6 F (37 C)] 98.6 F (37 C) (03/19 0519) Pulse Rate:  [56-82] 71 (03/19 0519) Resp:  [13-27] 16 (03/19 0519) BP: (104-149)/(50-97) 127/65 (03/19 0519) SpO2:  [90 %-100 %] 93 % (03/19 0519) Weight:  [115 lb 11.9 oz (52.5 kg)] 115 lb 11.9 oz (52.5 kg) (03/19 0500)  Intake/Output from previous day: 03/18 0701 - 03/19 0700 In: 1239.2 [P.O.:60; I.V.:879.2; IV Piggyback:300] Out: 60 [Urine:50; Blood:10] Intake/Output this shift: No intake/output data recorded.  Moderate hematoma in the right groin mostly inferior to the incision.  2+ left dorsalis pedis pulse and biphasic dorsalis pedis and posterior tibial signal by Doppler on the right foot.  Both feet warm and well perfused.  Lab Results: Recent Labs    08/29/17 0405 08/30/17 0524  WBC 10.4 7.1  HGB 12.2 9.1*  HCT 36.9 28.1*  PLT 313 214   BMET Recent Labs    08/29/17 0405 08/30/17 0524  NA 134* 131*  K 4.1 4.7  CL 97* 96*  CO2 20* 23  GLUCOSE 89 126*  BUN 14 12  CREATININE 0.94 0.94  CALCIUM 9.2 8.1*    Studies/Results: Dg Chest Port 1 View  Result Date: 08/29/2017 CLINICAL DATA:  Palpitations.  Shortness of breath. EXAM: PORTABLE CHEST 1 VIEW COMPARISON:  Chest radiograph 06/09/2010 FINDINGS: The cardiomediastinal contours are normal. The lungs are clear. Pulmonary vasculature is normal. No consolidation, pleural effusion, or pneumothorax. No acute osseous abnormalities are seen. IMPRESSION: No acute pulmonary process. Electronically Signed   By: Jeb Levering M.D.   On: 08/29/2017 04:42   Anti-infectives: Anti-infectives (From admission, onward)   Start     Dose/Rate Route Frequency Ordered Stop   08/29/17 2300  ceFAZolin (ANCEF) IVPB 1 g/50 mL premix     1 g 100 mL/hr over 30 Minutes Intravenous Every 8 hours 08/29/17 1726 08/30/17 1459    08/29/17 1726  trimethoprim (TRIMPEX) tablet 100 mg     100 mg Oral As needed 08/29/17 1726     08/29/17 1415  ceFAZolin (ANCEF) IVPB 2g/100 mL premix     2 g 200 mL/hr over 30 Minutes Intravenous  Once 08/29/17 1404 08/29/17 1452   08/29/17 1344  ceFAZolin (ANCEF) 2-4 GM/100ML-% IVPB    Comments:  Forte, Lindsi   : cabinet override      08/29/17 1344 08/29/17 1452      Assessment/Plan: s/p Procedure(s): Right Groin Exploration   (Right) Stable overall from vascular standpoint.  Okay to continue anticoagulation and watch right groin.  Able for discharge from vascular standpoint when cardiac status stable.  Will follow up in 2 weeks as an outpatient.  Our office will coordinate this.   LOS: 1 day   Anaya Bovee 08/30/2017, 8:06 AM

## 2017-08-31 ENCOUNTER — Other Ambulatory Visit: Payer: Self-pay

## 2017-08-31 ENCOUNTER — Inpatient Hospital Stay (HOSPITAL_COMMUNITY): Payer: Medicare Other

## 2017-08-31 DIAGNOSIS — I361 Nonrheumatic tricuspid (valve) insufficiency: Secondary | ICD-10-CM

## 2017-08-31 LAB — BASIC METABOLIC PANEL
Anion gap: 10 (ref 5–15)
BUN: 9 mg/dL (ref 6–20)
CO2: 25 mmol/L (ref 22–32)
Calcium: 8.2 mg/dL — ABNORMAL LOW (ref 8.9–10.3)
Chloride: 98 mmol/L — ABNORMAL LOW (ref 101–111)
Creatinine, Ser: 0.78 mg/dL (ref 0.44–1.00)
GFR calc Af Amer: >60 mL/min (ref >60)
GFR calc non Af Amer: >60 mL/min (ref >60)
Glucose, Bld: 109 mg/dL — ABNORMAL HIGH (ref 65–99)
Potassium: 4.5 mmol/L (ref 3.5–5.1)
Sodium: 133 mmol/L — ABNORMAL LOW (ref 135–145)

## 2017-08-31 LAB — CBC
HCT: 24.1 % — ABNORMAL LOW (ref 36.0–46.0)
Hemoglobin: 7.8 g/dL — ABNORMAL LOW (ref 12.0–15.0)
MCH: 33.9 pg (ref 26.0–34.0)
MCHC: 32.4 g/dL (ref 30.0–36.0)
MCV: 104.8 fL — ABNORMAL HIGH (ref 78.0–100.0)
Platelets: 198 10*3/uL (ref 150–400)
RBC: 2.3 MIL/uL — ABNORMAL LOW (ref 3.87–5.11)
RDW: 19.3 % — ABNORMAL HIGH (ref 11.5–15.5)
WBC: 10.2 10*3/uL (ref 4.0–10.5)

## 2017-08-31 LAB — ECHOCARDIOGRAM COMPLETE
Height: 64 in
Weight: 1851.86 oz

## 2017-08-31 NOTE — Progress Notes (Signed)
  Echocardiogram 2D Echocardiogram has been performed.  Jennette Dubin 08/31/2017, 8:54 AM

## 2017-08-31 NOTE — Progress Notes (Addendum)
[facility]  Subjective  - Doing OK, worried about what is to come.   Objective 112/68 70 98 F (36.7 C) (Oral) 16 93%  Intake/Output Summary (Last 24 hours) at 08/31/2017 0940 Last data filed at 08/31/2017 0700 Gross per 24 hour  Intake 789.65 ml  Output 0 ml  Net 789.65 ml    Right groin with ecchymosis and hematoma.  Hematoma is not expanding. Right groin incision healing well, dry guaze In place Palpable DP B left > right.  Sensation intact, MMT grossly 5/5.  Ambulating in room with guidance assistance from husband. 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  Echo performed today pending results Heparin on hold secondary to right groin hematoma.  Per Dr. Luther Parody note yesterday OK to restart anticoagulation.   Discharge planning Apixaban as out patient anticoagulation.    Roxy Horseman 08/31/2017 9:40 AM --  Laboratory Lab Results: Recent Labs    08/30/17 1456 08/31/17 0537  WBC 13.4* 10.2  HGB 8.2* 7.8*  HCT 25.9* 24.1*  PLT 234 198   BMET Recent Labs    08/30/17 0524 08/31/17 0537  NA 131* 133*  K 4.7 4.5  CL 96* 98*  CO2 23 25  GLUCOSE 126* 109*  BUN 12 9  CREATININE 0.94 0.78  CALCIUM 8.1* 8.2*    COAG No results found for: INR, PROTIME No results found for: PTT  I have examined the patient, reviewed and agree with above.  Right groin hematoma stable.  More diffuse with expected bruising.  Small area of firmness just below the incision.  Would hold anticoagulation another 24 hours and then resume if H&H remains stable  Curt Jews, MD 08/31/2017 12:51 PM

## 2017-08-31 NOTE — Progress Notes (Signed)
Progress Note  Patient Name: Crystal [name] Date of Encounter: 08/31/2017  Primary Cardiologist: [name], MD   Subjective   Pt denies CP or dyspnea  Inpatient Medications    Scheduled Meds: . acidophilus  1 capsule Oral Daily  . diltiazem  120 mg Oral Daily  . furosemide  40 mg Oral Daily  . gabapentin  400 mg Oral QHS  . LORazepam  0.5 mg Oral QHS  . mometasone-formoterol  2 puff Inhalation BID  . montelukast  10 mg Oral QPM  . pantoprazole  40 mg Oral Daily  . potassium chloride SA  20 mEq Oral Daily  . pravastatin  40 mg Oral q1800  . predniSONE  40 mg Oral Daily  . theophylline  200 mg Oral BID   Continuous Infusions: . sodium chloride    . magnesium sulfate 1 - 4 g bolus IVPB     PRN Meds: sodium chloride, acetaminophen, albuterol, guaiFENesin-dextromethorphan, magnesium sulfate 1 - 4 g bolus IVPB, metoprolol tartrate, morphine injection, ondansetron (ZOFRAN) IV, phenol, traMADol, trimethoprim   Vital Signs    Vitals:   08/30/17 2111 08/31/17 0614 08/31/17 0804 08/31/17 0947  BP: 114/66 112/68  (!) 134/55  Pulse: 71 70  66  Resp: 16 16  18   Temp: 97.7 F (36.5 C) 98 F (36.7 C)  97.9 F (36.6 C)  TempSrc: Oral Oral  Axillary  SpO2: 94% 95% 93% 98%  Weight: 115 lb 11.9 oz (52.5 kg)     Height:        Intake/Output Summary (Last 24 hours) at 08/31/2017 1048 Last data filed at 08/31/2017 0948 Gross per 24 hour  Intake 789.65 ml  Output 0 ml  Net 789.65 ml   Filed Weights   08/29/17 1720 08/30/17 0500 08/30/17 2111  Weight: 115 lb 11.9 oz (52.5 kg) 115 lb 11.9 oz (52.5 kg) 115 lb 11.9 oz (52.5 kg)    Telemetry    Sinus with pacs and PVCs - Personally Reviewed   Physical Exam   GEN: WD WN No acute distress.   Neck: supple Cardiac: RRR Respiratory: Clear to auscultation bilaterally; no wheeze GI: Soft, right groin with hematoma MS: No edema Neuro:  Grossly intact   Labs    Chemistry Recent Labs  Lab 08/29/17 0405 08/30/17 0524  08/31/17 0537  NA 134* 131* 133*  K 4.1 4.7 4.5  CL 97* 96* 98*  CO2 20* 23 25  GLUCOSE 89 126* 109*  BUN 14 12 9   CREATININE 0.94 0.94 0.78  CALCIUM 9.2 8.1* 8.2*  PROT 6.1*  --   --   ALBUMIN 3.7  --   --   AST 18  --   --   ALT 17  --   --   ALKPHOS 49  --   --   BILITOT 0.8  --   --   GFRNONAA 59* 59* >60  GFRAA >60 >60 >60  ANIONGAP 17* 12 10     Hematology Recent Labs  Lab 08/30/17 0524 08/30/17 1456 08/31/17 0537  WBC 7.1 13.4* 10.2  RBC 2.67* 2.47* 2.30*  HGB 9.1* 8.2* 7.8*  HCT 28.1* 25.9* 24.1*  MCV 105.2* 104.9* 104.8*  MCH 34.1* 33.2 33.9  MCHC 32.4 31.7 32.4  RDW 18.9* 18.9* 19.3*  PLT 214 234 198    Recent Labs  Lab 08/29/17 0427  TROPIPOC 0.08      Radiology    No results found.  Patient Profile     73  y.o. 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  Assessment & Plan    1 paroxysmal atrial fibrillation-patient remains in sinus this morning.  Continue cardizem. Heparin DCed due to right groin bleed. Minimal oozing today. Will resume apixaban in 2-3 days. We will continue with present dose of Cardizem for rate control if atrial fibrillation recurs.  We can consider addition of an antiarrhythmic if she has more frequent episodes in the future.  Await final echo results.  2 status post right groin exploration-SFA occlusion was chronic.  Patient has a hematoma at surgical site.  Hemoglobin decreased from 12.2 to 7.8.  Minimal oozing from surgical site.  Anticoagulation on hold. Vascular surgery following. Will recheck hemoglobin in AM; if stable, will likely DC.  3 giant cell arteritis-continue preadmission dose of prednisone.    4 hypertension-blood pressure is borderline.  Continue Cardizem and Lasix.   5 acute blood loss anemia-plan  as outlined under #2. Recheck hgb in AM.   For questions or updates, please contact [place] Please consult [email or web address] for contact info under Cardiology/STEMI.      Signed, [name], MD  08/31/2017, 10:48 AM

## 2017-08-31 NOTE — Plan of Care (Signed)
  Education: Knowledge of General Education information will improve 08/31/2017 1753 - Progressing by Emmaline Life, RN

## 2017-08-31 NOTE — Care Management Note (Signed)
Case Management Note  Patient Details  Name: Crystal [name] MRN: [id number] Date of Birth: 12/13/1945  Subjective/Objective:                 Spoke w patient and spouse at bedside. Patient blind, describes herself as indpependent, only DME used is cane to assist with safety in setting of vision deficit. Spouse drives.  They deny medication barriers or barriers with access to PCP. No other needs identified at this time. CM will continue to follow.    Action/Plan:   Expected Discharge Date:                  Expected Discharge Plan:  Home/Self Care  In-House Referral:     Discharge planning Services  CM Consult  Post Acute Care Choice:    Choice offered to:     DME Arranged:    DME Agency:     HH Arranged:    HH Agency:     Status of Service:  In process, will continue to follow  If discussed at Long Length of Stay Meetings, dates discussed:    Additional Comments:  Carles Collet, RN 08/31/2017, 10:20 AM

## 2017-09-01 ENCOUNTER — Encounter (HOSPITAL_COMMUNITY): Payer: Self-pay | Admitting: Physician Assistant

## 2017-09-01 DIAGNOSIS — I743 Embolism and thrombosis of arteries of the lower extremities: Secondary | ICD-10-CM

## 2017-09-01 HISTORY — DX: Embolism and thrombosis of arteries of the lower extremities: I74.3

## 2017-09-01 LAB — CBC
HCT: 24 % — ABNORMAL LOW (ref 36.0–46.0)
Hemoglobin: 7.8 g/dL — ABNORMAL LOW (ref 12.0–15.0)
MCH: 34.4 pg — ABNORMAL HIGH (ref 26.0–34.0)
MCHC: 32.5 g/dL (ref 30.0–36.0)
MCV: 105.7 fL — ABNORMAL HIGH (ref 78.0–100.0)
Platelets: 184 10*3/uL (ref 150–400)
RBC: 2.27 MIL/uL — ABNORMAL LOW (ref 3.87–5.11)
RDW: 20.2 % — ABNORMAL HIGH (ref 11.5–15.5)
WBC: 9.2 10*3/uL (ref 4.0–10.5)

## 2017-09-01 MED ORDER — DILTIAZEM HCL ER COATED BEADS 120 MG PO CP24: 120.0000 mg | ORAL_CAPSULE | Freq: Every day | ORAL | 11 refills | Status: DC

## 2017-09-01 MED ORDER — APIXABAN 5 MG PO TABS: 5.0000 mg | ORAL_TABLET | Freq: Two times a day (BID) | ORAL | 0 refills | Status: DC

## 2017-09-01 MED ORDER — APIXABAN 5 MG PO TABS: 5.0000 mg | ORAL_TABLET | Freq: Two times a day (BID) | ORAL | 11 refills | Status: DC

## 2017-09-01 MED ORDER — DILTIAZEM HCL 25 MG/5ML IV SOLN
10.0000 mg | Freq: Once | INTRAVENOUS | Status: DC
  Filled 2017-09-01: qty 5

## 2017-09-01 NOTE — Progress Notes (Signed)
Progress Note  Patient Name: Crystal [name] Date of Encounter: 09/01/2017  Primary Cardiologist: [name], MD   Subjective   No chest pain or dyspnea  Inpatient Medications    Scheduled Meds: . acidophilus  1 capsule Oral Daily  . diltiazem  120 mg Oral Daily  . furosemide  40 mg Oral Daily  . gabapentin  400 mg Oral QHS  . LORazepam  0.5 mg Oral QHS  . mometasone-formoterol  2 puff Inhalation BID  . montelukast  10 mg Oral QPM  . pantoprazole  40 mg Oral Daily  . potassium chloride SA  20 mEq Oral Daily  . pravastatin  40 mg Oral q1800  . predniSONE  40 mg Oral Daily  . theophylline  200 mg Oral BID   Continuous Infusions: . sodium chloride    . magnesium sulfate 1 - 4 g bolus IVPB     PRN Meds: sodium chloride, acetaminophen, albuterol, guaiFENesin-dextromethorphan, magnesium sulfate 1 - 4 g bolus IVPB, metoprolol tartrate, morphine injection, ondansetron (ZOFRAN) IV, phenol, traMADol, trimethoprim   Vital Signs    Vitals:   08/31/17 2024 08/31/17 2043 09/01/17 0325 09/01/17 0453  BP:  (!) 113/49  (!) 106/52  Pulse:  79  96  Resp:  19  20  Temp:  98.3 F (36.8 C)  98.4 F (36.9 C)  TempSrc:  Oral  Oral  SpO2: 95% 97%  97%  Weight:  115 lb 4.8 oz (52.3 kg) 115 lb 4.8 oz (52.3 kg)   Height:        Intake/Output Summary (Last 24 hours) at 09/01/2017 0732 Last data filed at 09/01/2017 6761 Gross per 24 hour  Intake 1020 ml  Output 700 ml  Net 320 ml   Filed Weights   08/30/17 2111 08/31/17 2043 09/01/17 0325  Weight: 115 lb 11.9 oz (52.5 kg) 115 lb 4.8 oz (52.3 kg) 115 lb 4.8 oz (52.3 kg)    Telemetry    Sinus with pacs and PVCs; 3 beats NSVT - Personally Reviewed   Physical Exam   GEN: WD WN NAD Neck: supple, no JVD Cardiac: RRR, no murmur Respiratory: CTA GI: Soft, right groin with hematoma (unchanged compared to yesterday) MS: No edema Neuro:  No focal findings   Labs    Chemistry Recent Labs  Lab 08/29/17 0405 08/30/17 0524  08/31/17 0537  NA 134* 131* 133*  K 4.1 4.7 4.5  CL 97* 96* 98*  CO2 20* 23 25  GLUCOSE 89 126* 109*  BUN 14 12 9   CREATININE 0.94 0.94 0.78  CALCIUM 9.2 8.1* 8.2*  PROT 6.1*  --   --   ALBUMIN 3.7  --   --   AST 18  --   --   ALT 17  --   --   ALKPHOS 49  --   --   BILITOT 0.8  --   --   GFRNONAA 59* 59* >60  GFRAA >60 >60 >60  ANIONGAP 17* 12 10     Hematology Recent Labs  Lab 08/30/17 0524 08/30/17 1456 08/31/17 0537  WBC 7.1 13.4* 10.2  RBC 2.67* 2.47* 2.30*  HGB 9.1* 8.2* 7.8*  HCT 28.1* 25.9* 24.1*  MCV 105.2* 104.9* 104.8*  MCH 34.1* 33.2 33.9  MCHC 32.4 31.7 32.4  RDW 18.9* 18.9* 19.3*  PLT 214 234 198    Recent Labs  Lab 08/29/17 0427  TROPIPOC 0.08      Patient Profile     72 y.o. 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  Assessment & Plan    1 paroxysmal atrial fibrillation-patient remains in sinus rhythm.  Continue cardizem at present dose. We can consider addition of an antiarrhythmic if she has more frequent episodes in the future.  Echo shows normal LV function, moderate LAE, mild to moderate TR. Given groin hematoma will hold apixaban until Monday 3/25 and then begin 5 mg BID.   2 status post right groin exploration-SFA occlusion was chronic.  Groin hematoma unchanged this AM. Anticoagulation on hold as outlined above until 3/25. Await hgb this AM; if stable, pt can be DCed with TOC appt one week.  3 giant cell arteritis-continue preadmission dose of prednisone.    4 hypertension-blood pressure is controlled.  Continue Cardizem and Lasix.   5 acute blood loss anemia-plan as outlined under #2. Repeat Hgb this AM pending.   If hgb stable, DC today; TOC appt one week; fu with me 3 months. > 30 min PA and physician time D2  For  questions or updates, please contact [place] Please consult [email or web address] for contact info under Cardiology/STEMI.      Signed, [name], MD  09/01/2017, 7:32 AM

## 2017-09-01 NOTE — Discharge Instructions (Signed)
Call for fevers or chills, or drainage from the incision. Call for numbness or tingling in your legs. Call for HR > 140, chest pain or SOB.

## 2017-09-01 NOTE — Progress Notes (Signed)
Spoke with [facility] PA and MD [facility] about pt going back into A-fib this AM around 830.   New orders given to get a blood pressure and then see if patient sys pressure is over 110 and then give iV cardizem.   Unable to give the Cardizem because sys blood pressure  isnt over 110.  Will continue to monitor.  Charlene Cowdrey, Mervin Kung RN

## 2017-09-01 NOTE — Progress Notes (Signed)
BENEFIT CHECK  Per CMA:  Memory Argue         # 3. S/W  EDWINA  @ [employer] RX # [phone] OPT- 5   ELIQUIS 5 MG BID  COVER- YES  CO-PAY- $ 37.00  TIER- 3 DRUG  PRIOR APPROVAL - NO   PREFERRED PHARMACY : [id number]   Kristen Cardinal, BSN, RN Nurse Case [facility]

## 2017-09-01 NOTE — Progress Notes (Signed)
Patient ID: Crystal [name], female   DOB: 1946-03-30, 72 y.o.   MRN: [id number] Comfortable this morning.  Right groin hematoma softening and diffusing with bruising in her thigh.  Discussed the expected resolution with the patient and her husband present  Hemoglobin and hematocrit stable.  For discharge today.  I will see her in the office in 2-3 weeks follow-up

## 2017-09-01 NOTE — Discharge Summary (Signed)
Discharge Summary    Patient ID: Crystal [name],  MRN: [id number], DOB/AGE: Jan 24, 1946 72 y.o.  Admit date: 08/29/2017 Discharge date: 09/01/2017  Primary Care Provider: [name] Primary Cardiologist: Dr Stanford Breed  Discharge Diagnoses    Principal Problem:   Paroxysmal atrial fibrillation with rapid ventricular response [facility]) Active Problems:   Essential hypertension   Femoral artery thrombosis, right (HCC)   Allergies Allergies  Allergen Reactions  . Simvastatin     myalgias  . Prednisone Hives    Diagnostic Studies/Procedures    [place] LE Doppler 08/29/2017 Final Interpretation: Right: There is no evidence of deep vein thrombosis in the lower extremity.There is no evidence of superficial venous thrombosis. Incidentally, there appears to be an acute arterial occlusion noted in the proximal to mid Femoral artery. Left: There is no evidence of deep vein thrombosis in the lower extremity.There is no evidence of superficial venous thrombosis.  [place] ABI 08/29/2017 Final Interpretation: Right: Resting right ankle-brachial index indicates moderate right lower extremity arterial disease. The right toe-brachial index is abnormal. Left: Resting left ankle-brachial index is within normal range. No evidence of significant left lower extremity arterial disease. The left toe-brachial index is normal.  PROCEDURE: Right femoral exploration: 08/29/2017 PROCEDURE DETAILS: The patient presented with new onset atrial fibrillation and complaints of bilateral 10 out of 10 lower extremity pain.  She had recent documentation of palpable pedal pulses.  On physical exam she had a easily palpable dorsalis pedis pulse on the left than on the right.  Her pain completely resolved and she had a warm foot with no evidence of ischemia.  Her ankle arm index was 0.59 on the right and normal on the left.  Duplex ultrasound showed what appeared to be acute thrombus in the superficial femoral artery and was  recommended that she undergo exploration for this.  I did explain that it is was possible at this may be chronic.  The right groin and right leg were prepped and draped in usual sterile fashion.  An incision was made over the common femoral artery pulse and carried down to isolate the common superficial femoral and profundus femoris arteries.  Patient was given 5000 units of intravenous heparin.  After adequate circulation time the common superficial femoral and profundus femoris arteries were occluded.  The common femoral artery was opened transversely.  A 4 Fogarty catheter was passed down the superficial femoral artery and would only go approximately 10-15 cm before it reached a chronic occlusion.  Further attempts were not successful passing this.  The Fogarty was passed down the deep femoral artery and there was good backbleeding but no thrombus removed.  There was no thrombus proximally.  The incision in the artery was closed with interrupted 6-0 Prolene sutures.  Clamps were removed with flow restored to the deep femoral artery.  The patient's heparin was not reversed.  Patient was wound was irrigated with saline was closed with 3-0 Vicryl in the subcutaneous and subcuticular tissue.  Sterile dressing was applied and the patient was transferred to the recovery room in stable condition   ECHO: 08/31/2017 - Left ventricle: The cavity size was normal. Wall thickness was   normal. Systolic function was normal. The estimated ejection   fraction was in the range of 60% to 65%. Wall motion was normal;   there were no regional wall motion abnormalities. Features are   consistent with a pseudonormal left ventricular filling pattern,   with concomitant abnormal relaxation and increased filling  pressure (grade 2 diastolic dysfunction). - Left atrium: The atrium was moderately dilated. - Tricuspid valve: There was mild-moderate regurgitation. - Pulmonary arteries: Systolic pressure was moderately  increased.   PA peak pressure: 47 mm Hg (S).  _____________   History of Present Illness     72 y.o. 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  She was taken to the operating room for right [facility] Course     Consultants: Dr Donnetta Hutching, VVS   The results of the Doppler studies were reviewed by Dr. Donnetta Hutching and Dr. Stanford Breed.  Urgent surgery was felt necessary and she was taken to the OR on 08/29/2017.  Surgical results are above.  She tolerated the procedure well.  After the surgery, she had a hematoma in the right groin mostly inferior to the incision.  Her distal pulses were restored, both feet were warm and well perfused.  She converted spontaneously to sinus rhythm.  She was on Cardizem for heart rate and blood pressure control.  An echocardiogram was ordered, results above.  Her PAS was elevated, she was continued on her home dose of Lasix.  Her weight remained stable during her hospital stay.  Her respiratory status was good.  She had recurrence of atrial fibrillation on 3/21, just prior to discharge.  She was held until the afternoon and spontaneously converted to sinus rhythm.  Her systolic blood pressure was 100-114, so we are unable to increase her Cardizem dose at this time.  She will have a early follow-up in the office.  She had some anemia after the surgery and this was followed. She did not require transfusion.  However, because of the anemia, the heparin she had been on after the surgery was discontinued.  The anemia was felt secondary to the surgery itself in the right groin hematoma.  The right groin hematoma remained stable after the surgery and Dr. Donnetta Hutching felt that there was no additional bleeding.  Her CHA2DS2VASc= 4 (female, PAD, HTN, age x 1).  Initially, the plan was to start apixaban, this was delayed because of  anemia and groin oozing.  There was concern that her thrombus was embolic from the atrial fibrillation.  However, Dr. Stanford Breed reviewed the situation with Dr. Donnetta Hutching.  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  Additionally, because of the decrease in her hemoglobin after the surgery, it was felt that she should not be on anticoagulation immediately.    She is to start apixaban at 5 mg twice daily on Monday, 03/25.  After she converted back into sinus rhythm, her heart rate and blood pressure were stable.  Her O2 saturation was normal.  No further inpatient workup is indicated and she is considered stable for discharge, to follow-up as an outpatient.  _____________  Discharge Vitals Blood pressure (!) 114/48, pulse 88, temperature 97.8 F (36.6 C), temperature source Oral, resp. rate 18, height 5\' 4"  (1.626 m), weight 115 lb 4.8 oz (52.3 kg), SpO2 97 %.  Filed Weights   08/30/17 2111 08/31/17 2043 09/01/17 0325  Weight: 115 lb 11.9 oz (52.5 kg) 115 lb 4.8 oz (52.3 kg) 115 lb 4.8 oz (52.3 kg)    Labs & Radiologic Studies    CBC Recent Labs    08/31/17 0537 09/01/17 0719  WBC 10.2 9.2  HGB 7.8* 7.8*  HCT 24.1* 24.0*  MCV 104.8* 105.7*  PLT 198 601   Basic Metabolic Panel Recent Labs    08/30/17 0524 08/31/17 0537  NA 131* 133*  K 4.7 4.5  CL 96* 98*  CO2 23 25  GLUCOSE 126* 109*  BUN 12 9  CREATININE 0.94 0.78  CALCIUM 8.1* 8.2*   Liver Function Tests Lab Results  Component Value Date   ALT 17 08/29/2017   AST 18 08/29/2017   ALKPHOS 49 08/29/2017   BILITOT 0.8 08/29/2017   Thyroid Function Tests TSH  Date Value Ref Range Status  08/29/2017 1.860 0.350 - 4.500 uIU/mL Final    Comment:    Performed by a 3rd Generation assay with a functional sensitivity of <=0.01 uIU/mL. Performed at [facility], [place] [address] [place], [place] [address]   06/27/2017 2.68 0.35 - 4.50 uIU/mL Final   _____________  Dg Chest Port 1 View  Result Date:  08/29/2017 CLINICAL DATA:  Palpitations.  Shortness of breath. EXAM: PORTABLE CHEST 1 VIEW COMPARISON:  Chest radiograph 06/09/2010 FINDINGS: The cardiomediastinal contours are normal. The lungs are clear. Pulmonary vasculature is normal. No consolidation, pleural effusion, or pneumothorax. No acute osseous abnormalities are seen. IMPRESSION: No acute pulmonary process. Electronically Signed   By: Jeb Levering M.D.   On: 08/29/2017 04:42   Disposition   Pt is being discharged home today in good condition.  Follow-up Plans & Appointments    Follow-up Information    Early, Arvilla Meres, MD Follow up in 2 week(s).   Specialties:  Vascular Surgery, Cardiology Why:  office will call Contact information: [place] [address] [phone]        [name], MD. Call.   Specialty:  Internal Medicine Contact information: [place] [address] [phone]          Discharge Instructions    Diet - low sodium heart healthy   Complete by:  As directed    Increase activity slowly   Complete by:  As directed       Discharge Medications   Allergies as of 09/01/2017      Reactions   Simvastatin    myalgias   Prednisone Hives      Medication List    STOP taking these medications   aspirin 81 MG tablet   escitalopram 10 MG tablet Commonly known as:  LEXAPRO   losartan 50 MG tablet Commonly known as:  COZAAR   valACYclovir 1000 MG tablet Commonly known as:  VALTREX     TAKE these medications   acetaminophen 500 MG tablet Commonly known as:  TYLENOL Take 1,000 mg by mouth every 6 (six) hours as needed for headache.   ALIGN 4 MG Caps Take 4 mg by mouth daily.   apixaban 5 MG Tabs tablet Commonly known as:  ELIQUIS Take 1 tablet (5 mg total) by mouth 2 (two) times daily.   CALCIUM 600/VITAMIN D PO Take 1 tablet by mouth daily.   diltiazem 120 MG 24 hr capsule Commonly known as:  CARDIZEM CD Take 1 capsule (120 mg total) by mouth  daily. Start taking on:  09/02/2017   furosemide 40 MG tablet Commonly known as:  LASIX TAKE ONE TABLET BY MOUTH DAILY   gabapentin 100 MG capsule Commonly known as:  NEURONTIN Take 400 mg by mouth at bedtime.   LORazepam 0.5 MG tablet Commonly known as:  ATIVAN Take 1 tablet (0.5 mg total) by mouth at bedtime.   montelukast 10 MG tablet Commonly known  as:  SINGULAIR TAKE ONE TABLET BY MOUTH DAILY What changed:    how much to take  how to take this  when to take this   oxyCODONE-acetaminophen 5-325 MG tablet Commonly known as:  PERCOCET/ROXICET Take 1 tablet by mouth every 6 (six) hours as needed for severe pain.   potassium chloride SA 20 MEQ tablet Commonly known as:  K-DUR,KLOR-CON Take 2 tablets (40 mEq total) by mouth daily.   pravastatin 40 MG tablet Commonly known as:  PRAVACHOL TAKE 1 TABLET DAILY BY MOUTH AT BEDTIME What changed:  See the new instructions.   predniSONE 20 MG tablet Commonly known as:  DELTASONE Take 3 tablets (60 mg total) by mouth daily. What changed:  how much to take   SYMBICORT 160-4.5 MCG/ACT inhaler Generic drug:  budesonide-formoterol INHALE 1 TO 2 PUFFS BY MOUTH EVERY 12 HOURS THEN GARGLE AND SPIT AFTER USE   theophylline 400 MG 24 hr tablet Commonly known as:  UNIPHYL TAKE 1 TABLET (400 MG TOTAL) BY MOUTH DAILY. What changed:    how much to take  when to take this   traMADol 50 MG tablet Commonly known as:  ULTRAM Take 1-2 tablets (50-100 mg total) by mouth every 8 (eight) hours as needed.   trimethoprim 100 MG tablet Commonly known as:  TRIMPEX Take 100 mg by mouth as needed (bladder problems during travel).   VENTOLIN HFA 108 (90 Base) MCG/ACT inhaler Generic drug:  albuterol Inhale 1-2 puffs into the lungs every 6 (six) hours as needed for wheezing or shortness of breath.   Vitamin D3 1000 units Caps Take 3,000 Units by mouth daily with lunch.         Outstanding Labs/Studies   None   Duration of  Discharge Encounter   Greater than 30 minutes including physician time.  Alcario Drought Savita Runner NP 09/01/2017, 4:49 PM

## 2017-09-01 NOTE — Progress Notes (Signed)
Discharge instructions reviewed with patient and husband. Concerns and questions addressed. Discharge instructions singed by husband and placed in chart. Two IVs and telemetry monitor discontinued. CCMD notified. Patient dressed and escorted by NT to car via wheelchair.  Ermalinda Memos, RN

## 2017-09-02 ENCOUNTER — Telehealth: Payer: Self-pay | Admitting: *Deleted

## 2017-09-02 NOTE — Telephone Encounter (Signed)
Pt was on TCM report admitted 08/29/17 for Paroxysmal atrial fibrillation with rapid ventricular response. Proceed w/testing also had a Right femoral exploration. Pt was D/C 09/01/17 will f/u w/surgeon at vascular center in 2 weeks...Johny Chess

## 2017-09-05 ENCOUNTER — Telehealth: Payer: Self-pay | Admitting: Cardiology

## 2017-09-05 ENCOUNTER — Other Ambulatory Visit (HOSPITAL_COMMUNITY): Payer: Medicare Other

## 2017-09-05 NOTE — Telephone Encounter (Signed)
[name] is calling because Crystal [name] has been in AFib since she has come home from the hospital on Last Thursday . Please call

## 2017-09-05 NOTE — Telephone Encounter (Signed)
Spoke with pt husband, he called to let office know pt is doing well since procedure last week.  Pt had BP today of 130/82 HR 68 and no complaints at this time.  Pt started her Elliquis as instructed today 3/25 and reminded of her f/u appointment on 4/3. Verbalized understanding no additional questions at this time.

## 2017-09-05 NOTE — Telephone Encounter (Signed)
Left message to call back on pt emergency contact phone number. Left message to call back on home phone as well.

## 2017-09-07 ENCOUNTER — Other Ambulatory Visit: Payer: Self-pay

## 2017-09-07 ENCOUNTER — Telehealth: Payer: Self-pay | Admitting: *Deleted

## 2017-09-07 ENCOUNTER — Encounter (HOSPITAL_COMMUNITY): Payer: Self-pay

## 2017-09-07 ENCOUNTER — Emergency Department (HOSPITAL_COMMUNITY)
Admission: EM | Admit: 2017-09-07 | Discharge: 2017-09-07 | Disposition: A | Discharge status: Home / Self Care | Payer: Medicare Other | Attending: Emergency Medicine | Admitting: Emergency Medicine

## 2017-09-07 ENCOUNTER — Emergency Department (HOSPITAL_COMMUNITY): Payer: Medicare Other

## 2017-09-07 ENCOUNTER — Telehealth: Payer: Self-pay | Admitting: Cardiology

## 2017-09-07 DIAGNOSIS — Z79899 Other long term (current) drug therapy: Secondary | ICD-10-CM | POA: Insufficient documentation

## 2017-09-07 DIAGNOSIS — I10 Essential (primary) hypertension: Secondary | ICD-10-CM | POA: Diagnosis not present

## 2017-09-07 DIAGNOSIS — R110 Nausea: Secondary | ICD-10-CM | POA: Diagnosis not present

## 2017-09-07 DIAGNOSIS — R079 Chest pain, unspecified: Secondary | ICD-10-CM | POA: Diagnosis not present

## 2017-09-07 DIAGNOSIS — I4891 Unspecified atrial fibrillation: Secondary | ICD-10-CM | POA: Diagnosis not present

## 2017-09-07 DIAGNOSIS — Z87891 Personal history of nicotine dependence: Secondary | ICD-10-CM | POA: Insufficient documentation

## 2017-09-07 DIAGNOSIS — R000 Tachycardia, unspecified: Secondary | ICD-10-CM | POA: Diagnosis not present

## 2017-09-07 LAB — CBC
HCT: 29 % — ABNORMAL LOW (ref 36.0–46.0)
Hemoglobin: 9.4 g/dL — ABNORMAL LOW (ref 12.0–15.0)
MCH: 34.1 pg — ABNORMAL HIGH (ref 26.0–34.0)
MCHC: 32.4 g/dL (ref 30.0–36.0)
MCV: 105.1 fL — ABNORMAL HIGH (ref 78.0–100.0)
Platelets: 261 10*3/uL (ref 150–400)
RBC: 2.76 MIL/uL — ABNORMAL LOW (ref 3.87–5.11)
RDW: 20.8 % — ABNORMAL HIGH (ref 11.5–15.5)
WBC: 8.5 10*3/uL (ref 4.0–10.5)

## 2017-09-07 LAB — BASIC METABOLIC PANEL
Anion gap: 12 (ref 5–15)
BUN: 14 mg/dL (ref 6–20)
CO2: 27 mmol/L (ref 22–32)
Calcium: 8.9 mg/dL (ref 8.9–10.3)
Chloride: 95 mmol/L — ABNORMAL LOW (ref 101–111)
Creatinine, Ser: 0.74 mg/dL (ref 0.44–1.00)
GFR calc Af Amer: >60 mL/min (ref >60)
GFR calc non Af Amer: >60 mL/min (ref >60)
Glucose, Bld: 86 mg/dL (ref 65–99)
Potassium: 3.2 mmol/L — ABNORMAL LOW (ref 3.5–5.1)
Sodium: 134 mmol/L — ABNORMAL LOW (ref 135–145)

## 2017-09-07 LAB — TSH: TSH: 2.546 u[IU]/mL (ref 0.350–4.500)

## 2017-09-07 LAB — I-STAT TROPONIN, ED: Troponin i, poc: 0.02 ng/mL (ref 0.00–0.08)

## 2017-09-07 LAB — MAGNESIUM: Magnesium: 2.3 mg/dL (ref 1.7–2.4)

## 2017-09-07 MED ORDER — SODIUM CHLORIDE 0.9 % IV BOLUS
500.0000 mL | Freq: Once | INTRAVENOUS | Status: AC
  Administered 2017-09-07: 500 mL via INTRAVENOUS

## 2017-09-07 MED ORDER — DILTIAZEM HCL 30 MG PO TABS: 30.0000 mg | ORAL_TABLET | Freq: Once | ORAL | 0 refills | Status: DC | PRN

## 2017-09-07 NOTE — Telephone Encounter (Signed)
Pt was on TCM report

## 2017-09-07 NOTE — Discharge Instructions (Signed)
We saw you in the ER for chest pain which was because of A. fib with rapid heart rate. We have observed you in the ER for close to 4 hours, and your heart rate now has stabilized and the chest pain resolved.  Please take an additional Cardizem 30 mg tablet only if you have palpitations and rapid heart rate.  Continue taking your regular daily 120 mg extended release Cardizem.  Return to the ER immediately if you start having chest pain, shortness of breath, dizziness or sweating.  Also return to the ER if your symptoms are not responding to the Cardiazem.

## 2017-09-07 NOTE — ED Provider Notes (Addendum)
[facility] EMERGENCY DEPARTMENT Provider Note   CSN: [id number] Arrival date & time: 09/07/17  0235     History   Chief Complaint Chief Complaint  Patient presents with  . Chest Pain  . atrial fibrillation RVR    HPI Crystal [name] is a 72 y.o. female.  HPI  72 year old female comes in with chief complaint of chest pain and tachycardia. Patient has history of paroxysmal A. fib, peripheral vascular disease, temporal arteritis with blindness.  Patient was diagnosed with A. fib within the last 2 weeks and started on Cardizem 120 mg.  Patient states that she woke up in the middle the night with chest pain.  Chest pain was midsternal, sharp and radiating to her back and towards her neck.  Patient called EMS who noted that patient was in A. fib with RVR.  In route patient was given 5 mg of IV metoprolol, and patient responded to the therapy and was noted to be in sinus rhythm during my evaluation.  Patient's chest pain also resolved once her heart rate improved, and in total her chest pain lasted for about 20 minutes.  Patient has been taking her medications as prescribed.  She denies any recent illnesses and there is no history of PE or DVT.  Past Medical History:  Diagnosis Date  . Adenomatous colon polyp 2007 & 2010    Dr [name]  . Allergy    seasonal  . Arthritis   . Cataract   . Diverticulosis   . 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 Hyperlipidemia   . Hypertension   . IBS (irritable bowel syndrome)   . MVP (mitral valve prolapse)   . Paroxysmal atrial fibrillation with rapid ventricular response ([place]) 08/29/2017  . Peripheral vascular disease ([place])   . RAD (reactive airway disease)     Patient Active Problem List   Diagnosis Date Noted  . 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 Paroxysmal atrial fibrillation with rapid ventricular response ([place]) 08/29/2017  . Paroxysmal atrial fibrillation (HCC)   . Herpes zoster without  complication 83/41/9622  . Complete loss of vision 07/27/2017  . Jaw claudication 07/27/2017  . Temporal arteritis ([place]) 07/18/2017  . Vision, loss, sudden, bilateral 07/18/2017  . 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 Parotitis 10/06/2016  . Osteoarthritis 06/25/2016  . Anxiety 06/25/2016  . Back pain 07/18/2015  . 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 Osteopenia 06/03/2008  . History of colonic polyps 06/03/2008  . INTENTION TREMOR 12/06/2007  . IBS 10/16/2007  . Hyperlipidemia 06/06/2007  . Essential hypertension 06/06/2007  . Chronic obstructive airway disease with asthma ([place]) 02/23/2007    Past Surgical History:  Procedure Laterality Date  . ABDOMINAL HYSTERECTOMY     with USO for dysfunctionall menses  . APPENDECTOMY    . ARTERY BIOPSY Bilateral 07/21/2017   Procedure: BIOPSY BILATERAL TEMPORAL ARTERY;  Surgeon: [name], MD;  Location: [facility];  Service: Vascular;  Laterality: Bilateral;  . CATARACT EXTRACTION W/ INTRAOCULAR LENS IMPLANT  2013   bilateral; Dr [name]  . COLONOSCOPY  2013   negative , due 2018; Dr [name]  . COLONOSCOPY W/ POLYPECTOMY      X2 ; diverticulosis  . EMBOLECTOMY Right 08/29/2017   Procedure: Right Groin Exploration  ;  Surgeon: Rosetta Posner, MD;  Location: [facility];  Service: Vascular;  Laterality: Right;  . LUMBAR LAMINECTOMY  2000   Dr Vertell Limber  . SHOULDER SURGERY  R shoulder  . TONSILLECTOMY       OB History   None      Home Medications    Prior to Admission medications   Medication Sig Start Date End Date Taking? Authorizing Provider  acetaminophen (TYLENOL) 500 MG tablet Take 1,000 mg by mouth every 6 (six) hours as needed for headache.   Yes [provider]  apixaban (ELIQUIS) 5 MG TABS tablet Take 1 tablet (5 mg total) by mouth 2 (two) times daily. 09/01/17  Yes Barrett, Evelene Croon, PA-C  Calcium Carbonate-Vitamin D (CALCIUM 600/VITAMIN D PO) Take 1 tablet by mouth daily.    Yes [provider]  Cholecalciferol (VITAMIN D3) 1000 UNITS CAPS Take 3,000 Units by mouth daily with lunch.    Yes [provider]  diltiazem (CARDIZEM CD) 120 MG 24 hr capsule Take 1 capsule (120 mg total) by mouth daily. 09/02/17  Yes Barrett, Evelene Croon, PA-C  furosemide (LASIX) 40 MG tablet TAKE ONE TABLET BY MOUTH DAILY 08/08/17  Yes Burns, Claudina Lick, MD  gabapentin (NEURONTIN) 100 MG capsule Take 500 mg by mouth at bedtime.  08/05/17  Yes [provider]  LORazepam (ATIVAN) 0.5 MG tablet Take 1 tablet (0.5 mg total) by mouth at bedtime. 07/25/17  Yes Burns, Claudina Lick, MD  montelukast (SINGULAIR) 10 MG tablet TAKE ONE TABLET BY MOUTH DAILY Patient taking differently: TAKE ONE TABLET BY MOUTH in the evening 07/11/17  Yes Burns, Claudina Lick, MD  potassium chloride SA (K-DUR,KLOR-CON) 20 MEQ tablet Take 2 tablets (40 mEq total) by mouth daily. 07/26/17  Yes Burns, Claudina Lick, MD  pravastatin (PRAVACHOL) 40 MG tablet TAKE 1 TABLET DAILY BY MOUTH AT BEDTIME Patient taking differently: TAKE 40mg  BY MOUTH AT BEDTIME 07/11/17  Yes Burns, Claudina Lick, MD  predniSONE (DELTASONE) 20 MG tablet Take 3 tablets (60 mg total) by mouth daily. Patient taking differently: Take 30 mg by mouth daily.  07/22/17  Yes Patrecia Pour, Christean Grief, MD  Probiotic Product (ALIGN) 4 MG CAPS Take 4 mg by mouth daily.    Yes [provider]  SYMBICORT 160-4.5 MCG/ACT inhaler INHALE 1 TO 2 PUFFS BY MOUTH EVERY 12 HOURS THEN GARGLE AND SPIT AFTER USE 07/27/16  Yes Burns, Claudina Lick, MD  theophylline (UNIPHYL) 400 MG 24 hr tablet TAKE 1 TABLET (400 MG TOTAL) BY MOUTH DAILY. Patient taking differently: Take 200 mg by mouth 2 (two) times daily.  07/04/17  Yes Burns, Claudina Lick, MD  traMADol (ULTRAM) 50 MG tablet Take 1-2 tablets (50-100 mg total) by mouth every 8 (eight) hours as needed. 07/27/17  Yes Burns, Claudina Lick, MD  trimethoprim (TRIMPEX) 100 MG tablet Take 100 mg by mouth as needed (bladder problems during travel).  09/15/11  Yes [provider]  VENTOLIN HFA 108 (90 Base) MCG/ACT inhaler Inhale 1-2 puffs into the lungs every 6 (six) hours as needed for wheezing or shortness of breath. 06/03/17  Yes Burns, Claudina Lick, MD  diltiazem (CARDIZEM) 30 MG tablet Take 1 tablet (30 mg total) by mouth once as needed for up to 1 dose. 09/07/17   Varney Biles, MD  oxyCODONE-acetaminophen (PERCOCET/ROXICET) 5-325 MG tablet Take 1 tablet by mouth every 6 (six) hours as needed for severe pain. Patient not taking: Reported on 08/29/2017 07/25/17   [name], MD    Family History Family History  Problem Relation Age of Onset  . Heart attack Brother 24  . Parkinsonism Mother   . Stroke Mother   .  Heart attack Father        pre 24  . Diabetes Sister   . COPD Sister        X 3  . Other Son        suicide 2016  . Colon cancer Neg Hx   . Cancer Neg Hx   . Esophageal cancer Neg Hx   . Liver cancer Neg Hx   . Pancreatic cancer Neg Hx   . Rectal cancer Neg Hx   . Stomach cancer Neg Hx     Social History Social History   Tobacco Use  . Smoking status: Former Smoker    Packs/day: 1.00    Years: 23.00    Pack years: 23.00    Last attempt to quit: 06/14/1989    Years since quitting: 28.2  . Smokeless tobacco: Never Used  . Tobacco comment: smoked 1961-1991 , up to < 1 ppd  Substance Use Topics  . Alcohol use: Yes    Alcohol/week: 8.4 oz    Types: 14 Glasses of wine per week    Comment: Wine  . Drug use: No     Allergies   Simvastatin   Review of Systems Review of Systems  Constitutional: Positive for activity change. Negative for diaphoresis.  Respiratory: Positive for chest tightness. Negative for shortness of breath.   Cardiovascular: Positive for chest pain.  Gastrointestinal: Positive for nausea.  Allergic/Immunologic: Negative for immunocompromised state.  Hematological: Bruises/bleeds easily.  All other systems reviewed and are negative.    Physical Exam Updated Vital Signs BP (!) 100/56   Pulse  99   Temp 97.9 F (36.6 C) (Oral)   Resp 15   Ht 5\' 4"  (1.626 m)   Wt 52.2 kg (115 lb)   SpO2 98%   BMI 19.74 kg/m   Physical Exam  Constitutional: She is oriented to person, place, and time. She appears well-developed.  HENT:  Head: Normocephalic and atraumatic.  Eyes: EOM are normal.  Neck: Normal range of motion. Neck supple.  Cardiovascular: Normal rate and normal pulses.  Pulmonary/Chest: Effort normal.  Abdominal: Bowel sounds are normal.  Neurological: She is alert and oriented to person, place, and time.  Skin: Skin is warm and dry.  Nursing note and vitals reviewed.    ED Treatments / Results  Labs (all labs ordered are listed, but only abnormal results are displayed) Labs Reviewed  BASIC METABOLIC PANEL - Abnormal; Notable for the following components:      Result Value   Sodium 134 (*)    Potassium 3.2 (*)    Chloride 95 (*)    All other components within normal limits  CBC - Abnormal; Notable for the following components:   RBC 2.76 (*)    Hemoglobin 9.4 (*)    HCT 29.0 (*)    MCV 105.1 (*)    MCH 34.1 (*)    RDW 20.8 (*)    All other components within normal limits  MAGNESIUM  TSH  I-STAT TROPONIN, ED    EKG EKG Interpretation  Date/Time:  Wednesday September 07 2017 02:52:39 EDT Ventricular Rate:  70 PR Interval:    QRS Duration: 102 QT Interval:  399 QTC Calculation: 431 R Axis:   16 Text Interpretation:  Sinus rhythm Multiple premature complexes, vent & supraven Anterior infarct, old No acute changes Confirmed by Varney Biles ([id number]) on 09/07/2017 3:51:54 AM   Radiology Dg Chest 2 View  Result Date: 09/07/2017 CLINICAL DATA:  72 y/o  F; chest pain  and atrial fibrillation. EXAM: CHEST - 2 VIEW COMPARISON:  08/29/2017 chest radiograph FINDINGS: Stable heart size and mediastinal contours are within normal limits. Both lungs are clear. The visualized skeletal structures are unremarkable. IMPRESSION: No acute pulmonary process identified.  Electronically Signed   By: Kristine Garbe M.D.   On: 09/07/2017 03:55    Procedures Procedures (including critical care time)  Medications Ordered in ED Medications  sodium chloride 0.9 % bolus 500 mL (0 mLs Intravenous Stopped 09/07/17 2774)     Initial Impression / Assessment and Plan / ED Course  I have reviewed the triage vital signs and the nursing notes.  Pertinent labs & imaging results that were available during my care of the patient were reviewed by me and considered in my medical decision making (see chart for details).  Clinical Course as of Sep 07 644  Wed Sep 07, 2017  0639 Reassessment x2 completed.  Patient continues to have A. fib with PVCs. I spoke with Dr. Raiford Simmonds, cardiology fellow.  He recommends that we give patient 30 mg IR diltiazem to be used as as needed.  She can continue taking her home dose of diltiazem for now.  I think that is actually a very good idea given that patient is supposed to see Dr. Stanford Breed this week.  Patient is also comfortable with plan.  Strict ER return precautions discussed.  We will discharge.   [AN]    Clinical Course User Index [AN] Varney Biles, MD    72 year old female with history of paroxysmal A. Fib, giant cell arteritis comes in with chief complaint of chest pain and tachycardia.  Patient was in A. fib with RVR which was likely causing demand ischemia.  Her heart rate is now in the 70s and she is chest pain-free.  We will monitor patient in the ER and get basic lab workup.  History and exam is not indicative of any specific etiology for the A. fib with RVR.  Patient was diagnosed with A. fib chest within the last couple of weeks, therefore she might just need medication optimization.   This patients CHA2DS2-VASc Score and unadjusted Ischemic Stroke Rate (% per year) is equal to 3.2 % stroke rate/year from a score of 3  Above score calculated as 1 point each if present [CHF, HTN, DM, Vascular=MI/PAD/Aortic Plaque,  Age if 65-74, or Female] Above score calculated as 2 points each if present [Age > 75, or Stroke/TIA/TE]    Final Clinical Impressions(s) / ED Diagnoses   Final diagnoses:  Atrial fibrillation with RVR [facility])    ED Discharge Orders        Ordered    Amb referral to AFIB Clinic     09/07/17 0353    diltiazem (CARDIZEM) 30 MG tablet  Once PRN     09/07/17 1287       Varney Biles, MD 09/07/17 [place], [place], MD 09/07/17 [phone]

## 2017-09-07 NOTE — ED Triage Notes (Signed)
Per [facility], pt arrives from home with complaints of new onset chest pain that radiated to jaw, back and shoulder blades and both arms. Pt reports chest pain has subsided. Pt reports jaw pain now 4/10. Pt reports headache starting at 9 pm and is a 9/10. Pt had new diagnosis of a fib 1 week ago. EMS reports pt initial HR of 170's. EMS gave 5 of metropolol and then HR down to 130's.

## 2017-09-07 NOTE — ED Notes (Signed)
ED Provider at bedside. 

## 2017-09-07 NOTE — Telephone Encounter (Signed)
New Message  Pt c/o medication issue:  1. Name of Medication: diltiazem (CARDIZEM) 30 MG tablet  2. How are you currently taking this medication (dosage and times per day)? Take 1 tablet (30 mg total) by mouth once as needed for up to 1 dose  3. Are you having a reaction (difficulty breathing--STAT)? no  4. What is your medication issue? Pt's husband states that they was suppose to be getting a fast acting version of the medication for 30 mg but never received it. Please call

## 2017-09-07 NOTE — Telephone Encounter (Signed)
Pt was seen at ED 09/07/17 for chest pain and A-fib pt was evaluated and release will f/u w/cardiology.Marland KitchenJohny Chess

## 2017-09-07 NOTE — Telephone Encounter (Signed)
Patient was seen in ED today and was ordered diltiazem 30mg  IR per cardiology fellow. Husband states Rx was not received at [facility] pharmacy. Rx(s) sent to pharmacy electronically.

## 2017-09-12 ENCOUNTER — Ambulatory Visit (INDEPENDENT_AMBULATORY_CARE_PROVIDER_SITE_OTHER): Payer: Self-pay | Admitting: Family

## 2017-09-12 ENCOUNTER — Telehealth: Payer: Self-pay | Admitting: *Deleted

## 2017-09-12 ENCOUNTER — Encounter: Payer: Self-pay | Admitting: Family

## 2017-09-12 VITALS — BP 96/54 | HR 71 | Temp 97.3°F | Resp 18 | Ht 64.0 in | Wt 120.1 lb

## 2017-09-12 DIAGNOSIS — L7634 Postprocedural seroma of skin and subcutaneous tissue following other procedure: Secondary | ICD-10-CM

## 2017-09-12 NOTE — Progress Notes (Signed)
Postoperative Visit   History of Present Illness  Crystal [name] is a 72 y.o. year old female who is s/p Right femoral exploration on 08-29-17 by Dr. Donnetta Hutching for Right SFA occlusion.  Pt returns today after received phone call from patient's husband. Bleeding from right groin x 7 days. Denies any heat, redness or purulent drainage. No fever or chills. States blood gets on clothes and sheets at night. She states the swelling in her right groin has diminished some.    Pt has a follow up appointment with Dr. Donnetta Hutching on 09-20-17.   The patient is able to complete her activities of daily living.   She is blind.   For VQI Use Only  PRE-ADM LIVING: Home  AMB STATUS: Ambulatory   Past Medical History:  Diagnosis Date  . Adenomatous colon polyp 2007 & 2010    Dr [name]  . Allergy    seasonal  . Arthritis   . Cataract   . Diverticulosis   . 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 Hyperlipidemia   . Hypertension   . IBS (irritable bowel syndrome)   . MVP (mitral valve prolapse)   . Paroxysmal atrial fibrillation with rapid ventricular response ([place]) 08/29/2017  . Peripheral vascular disease ([place])   . RAD (reactive airway disease)     Past Surgical History:  Procedure Laterality Date  . ABDOMINAL HYSTERECTOMY     with USO for dysfunctionall menses  . APPENDECTOMY    . ARTERY BIOPSY Bilateral 07/21/2017   Procedure: BIOPSY BILATERAL TEMPORAL ARTERY;  Surgeon: [name], MD;  Location: [facility];  Service: Vascular;  Laterality: Bilateral;  . CATARACT EXTRACTION W/ INTRAOCULAR LENS IMPLANT  2013   bilateral; Dr [name]  . COLONOSCOPY  2013   negative , due 2018; Dr [name]  . COLONOSCOPY W/ POLYPECTOMY      X2 ; diverticulosis  . EMBOLECTOMY Right 08/29/2017   Procedure: Right Groin Exploration  ;  Surgeon: Rosetta Posner, MD;  Location: [facility];  Service: Vascular;  Laterality: Right;  . LUMBAR LAMINECTOMY  2000   Dr Vertell Limber  . SHOULDER SURGERY     R shoulder  .  TONSILLECTOMY      Social History   Socioeconomic History  . Marital status: Married    Spouse name: Not on file  . Number of children: Not on file  . Years of education: Not on file  . Highest education level: Not on file  Occupational History  . Occupation: retired  Scientific laboratory technician  . Financial resource strain: Not on file  . Food insecurity:    Worry: Not on file    Inability: Not on file  . Transportation needs:    Medical: Not on file    Non-medical: Not on file  Tobacco Use  . Smoking status: Former Smoker    Packs/day: 1.00    Years: 23.00    Pack years: 23.00    Last attempt to quit: 06/14/1989    Years since quitting: 28.2  . Smokeless tobacco: Never Used  . Tobacco comment: smoked 1961-1991 , up to < 1 ppd  Substance and Sexual Activity  . Alcohol use: Yes    Alcohol/week: 8.4 oz    Types: 14 Glasses of wine per week    Comment: Wine  . Drug use: No  . Sexual activity: Not on file  Lifestyle  . Physical activity:    Days per week: Not on file    Minutes per  session: Not on file  . Stress: Not on file  Relationships  . Social connections:    Talks on phone: Not on file    Gets together: Not on file    Attends religious service: Not on file    Active member of club or organization: Not on file    Attends meetings of clubs or organizations: Not on file    Relationship status: Not on file  . Intimate partner violence:    Fear of current or ex partner: Not on file    Emotionally abused: Not on file    Physically abused: Not on file    Forced sexual activity: Not on file  Other Topics Concern  . Not on file  Social History Narrative  . Not on file    Allergies  Allergen Reactions  . Simvastatin     myalgias    Current Outpatient Medications on File Prior to Visit  Medication Sig Dispense Refill  . acetaminophen (TYLENOL) 500 MG tablet Take 1,000 mg by mouth every 6 (six) hours as needed for headache.    Marland Kitchen apixaban (ELIQUIS) 5 MG TABS tablet Take 1  tablet (5 mg total) by mouth 2 (two) times daily. 60 tablet 11  . Calcium Carbonate-Vitamin D (CALCIUM 600/VITAMIN D PO) Take 1 tablet by mouth daily.     . Cholecalciferol (VITAMIN D3) 1000 UNITS CAPS Take 3,000 Units by mouth daily with lunch.     . diltiazem (CARDIZEM CD) 120 MG 24 hr capsule Take 1 capsule (120 mg total) by mouth daily. 30 capsule 11  . diltiazem (CARDIZEM) 30 MG tablet Take 1 tablet (30 mg total) by mouth once as needed for up to 1 dose. 14 tablet 0  . furosemide (LASIX) 40 MG tablet TAKE ONE TABLET BY MOUTH DAILY 90 tablet 3  . gabapentin (NEURONTIN) 100 MG capsule Take 500 mg by mouth at bedtime.   0  . LORazepam (ATIVAN) 0.5 MG tablet Take 1 tablet (0.5 mg total) by mouth at bedtime. 30 tablet 2  . montelukast (SINGULAIR) 10 MG tablet TAKE ONE TABLET BY MOUTH DAILY (Patient taking differently: TAKE ONE TABLET BY MOUTH in the evening) 90 tablet 1  . oxyCODONE-acetaminophen (PERCOCET/ROXICET) 5-325 MG tablet Take 1 tablet by mouth every 6 (six) hours as needed for severe pain. 30 tablet 0  . potassium chloride SA (K-DUR,KLOR-CON) 20 MEQ tablet Take 2 tablets (40 mEq total) by mouth daily. 60 tablet 5  . pravastatin (PRAVACHOL) 40 MG tablet TAKE 1 TABLET DAILY BY MOUTH AT BEDTIME (Patient taking differently: TAKE 40mg  BY MOUTH AT BEDTIME) 90 tablet 1  . predniSONE (DELTASONE) 20 MG tablet Take 3 tablets (60 mg total) by mouth daily. (Patient taking differently: Take 30 mg by mouth daily. ) 90 tablet 0  . Probiotic Product (ALIGN) 4 MG CAPS Take 4 mg by mouth daily.     . SYMBICORT 160-4.5 MCG/ACT inhaler INHALE 1 TO 2 PUFFS BY MOUTH EVERY 12 HOURS THEN GARGLE AND SPIT AFTER USE 10.2 g 11  . theophylline (UNIPHYL) 400 MG 24 hr tablet TAKE 1 TABLET (400 MG TOTAL) BY MOUTH DAILY. (Patient taking differently: Take 200 mg by mouth 2 (two) times daily. ) 90 tablet 3  . traMADol (ULTRAM) 50 MG tablet Take 1-2 tablets (50-100 mg total) by mouth every 8 (eight) hours as needed. 60 tablet  0  . trimethoprim (TRIMPEX) 100 MG tablet Take 100 mg by mouth as needed (bladder problems during travel).     Marland Kitchen  VENTOLIN HFA 108 (90 Base) MCG/ACT inhaler Inhale 1-2 puffs into the lungs every 6 (six) hours as needed for wheezing or shortness of breath. 1 Inhaler 8   No current facility-administered medications on file prior to visit.       Physical Examination  Vitals:   09/12/17 1449 09/12/17 1451  BP: (!) 106/52 (!) 96/54  Pulse: 71   Resp: 18   Temp: (!) 97.3 F (36.3 C)   TempSrc: Oral   SpO2: 97%   Weight: 120 lb 1.6 oz (54.5 kg)   Height: 5\' 4"  (1.626 m)    Body mass index is 20.62 kg/m.  Bilateral DP and PT pulses with + Doppler signals. Draining seroma from incision of right groin. Extensive resolving ecchymosis from right groin to pubis, to right hip to right thigh.   Medical Decision Making  Crystal [name] is a 72 y.o. year old female who presents s/p Right femoral exploration on 08-29-17 by Dr. Donnetta Hutching for Right SFA occlusion. .  Pt takes Eliquis for atrial fib.  Dr. Trula Slade spoke with pt and husband and examined pt.  Wicking gauze dressing applied to right groin incision, incision opened slightly by Dr. Trula Slade.  Draining seroma.  HH will be contacted to apply wound vac to right groin draining seroma.  Follow up with Dr. Donnetta Hutching as scheduled on 09-20-17.    Thank you for allowing [place] to participate in this patient's care.  Clemon Chambers, RN, MSN, FNP-C [facility] Office: [phone]  09/12/2017, 2:52 PM  Clinic MD: Trula Slade

## 2017-09-12 NOTE — Telephone Encounter (Signed)
From patient's husband. Bleeding from right groin x 7 days. Denies any heat, redness or purulent drainage. No fever or chills. States blood gets on clothes and sheets at night. Instructed to keep area clean and covered and will see NP today for check

## 2017-09-13 ENCOUNTER — Telehealth: Payer: Self-pay | Admitting: *Deleted

## 2017-09-13 NOTE — Telephone Encounter (Signed)
Spoke to husband regarding homehealth.  Tiffany from [facility] is contacting Crystal [name] from [facility] to see if they can start wound care treatment, applying wound vac To right groin wound.  Tiffany is to call back with response.

## 2017-09-14 ENCOUNTER — Ambulatory Visit: Payer: Medicare Other | Admitting: Physician Assistant

## 2017-09-14 ENCOUNTER — Encounter: Payer: Self-pay | Admitting: Physician Assistant

## 2017-09-14 VITALS — BP 128/70 | HR 89 | Ht 64.0 in | Wt 120.2 lb

## 2017-09-14 DIAGNOSIS — E785 Hyperlipidemia, unspecified: Secondary | ICD-10-CM

## 2017-09-14 DIAGNOSIS — I10 Essential (primary) hypertension: Secondary | ICD-10-CM

## 2017-09-14 DIAGNOSIS — I480 Paroxysmal atrial fibrillation: Secondary | ICD-10-CM | POA: Diagnosis not present

## 2017-09-14 DIAGNOSIS — M316 Other giant cell arteritis: Secondary | ICD-10-CM | POA: Diagnosis not present

## 2017-09-14 DIAGNOSIS — I739 Peripheral vascular disease, unspecified: Secondary | ICD-10-CM

## 2017-09-14 MED ORDER — DILTIAZEM HCL ER COATED BEADS 180 MG PO CP24: 180.0000 mg | ORAL_CAPSULE | Freq: Every day | ORAL | 3 refills | Status: DC

## 2017-09-14 NOTE — Progress Notes (Signed)
Cardiology Office Note    Date:  09/16/2017   ID:  Crystal [name], DOB 15-Jan-1946, MRN [id number]  PCP:  [name], MD  Cardiologist:  Dr. Stanford Breed  Chief Complaint  Patient presents with  . Hospitalization Follow-up    History of Present Illness:  Crystal [name] is a 72 y.o. female with PMH of giant cell arteritis with bilateral blindness, hypertension, hyperlipidemia, mitral valve prolapse and recently diagnosed atrial fibrillation.  She had a giant cell arteritis in February 2019 which resulted in blindness of both eyes.  She was placed on steroid taper.  She subsequently developed shingles and was placed on Valtrex.  She presented to the hospital on 08/29/2017 with leg pain in the palpitation, she was found to be in atrial fibrillation in route.  She was placed on Cardizem bolus 10 mg followed by IV drip, she subsequently converted to normal sinus rhythm.  Ultrasound obtained for her lower extremity pain revealed thrombotic occlusion of her right SFA with reconstitution of popliteal artery above the knee, no ultrasound evidence of plaque, this appears to be consistent with emboli.  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  Postprocedure, hemoglobin decreased from 12.2 down to 9.1.  Cardiology was called to manage atrial fibrillation, she was placed on oral Cardizem for rate control and Eliquis for anticoagulation purposes.  She did develop a hematoma after this surgery as well.  Eliquis was initiated on 09/05/2016 at 5 mg twice daily.  Patient presents today for cardiology office visit.  She denies any current chest pain or shortness of breath.  She still has frequent PACs based on today's EKG, I would increase the diltiazem to 180 mg daily.  Otherwise she has no lower extremity edema, orthopnea or PND.  She will need a basic metabolic panel and CBC.    Past Medical History:  Diagnosis Date  . Adenomatous  colon polyp 2007 & 2010    Dr [name]  . Allergy    seasonal  . Arthritis   . Cataract   . Diverticulosis   . 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 Hyperlipidemia   . Hypertension   . IBS (irritable bowel syndrome)   . MVP (mitral valve prolapse)   . Paroxysmal atrial fibrillation with rapid ventricular response ([place]) 08/29/2017  . Peripheral vascular disease ([place])   . RAD (reactive airway disease)     Past Surgical History:  Procedure Laterality Date  . ABDOMINAL HYSTERECTOMY     with USO for dysfunctionall menses  . APPENDECTOMY    . ARTERY BIOPSY Bilateral 07/21/2017   Procedure: BIOPSY BILATERAL TEMPORAL ARTERY;  Surgeon: [name], MD;  Location: [facility];  Service: Vascular;  Laterality: Bilateral;  . CATARACT EXTRACTION W/ INTRAOCULAR LENS IMPLANT  2013   bilateral; Dr [name]  . COLONOSCOPY  2013   negative , due 2018; Dr [name]  . COLONOSCOPY W/ POLYPECTOMY      X2 ; diverticulosis  . EMBOLECTOMY Right 08/29/2017   Procedure: Right Groin Exploration  ;  Surgeon: Rosetta Posner, MD;  Location: [facility];  Service: Vascular;  Laterality: Right;  . LUMBAR LAMINECTOMY  2000   Dr Vertell Limber  . SHOULDER SURGERY     R shoulder  . TONSILLECTOMY      Current Medications: Outpatient Medications Prior to Visit  Medication Sig Dispense Refill  . acetaminophen (TYLENOL) 500 MG tablet Take 1,000  mg by mouth every 6 (six) hours as needed for headache.    Marland Kitchen apixaban (ELIQUIS) 5 MG TABS tablet Take 1 tablet (5 mg total) by mouth 2 (two) times daily. 60 tablet 11  . Calcium Carbonate-Vitamin D (CALCIUM 600/VITAMIN D PO) Take 1 tablet by mouth daily.     . Cholecalciferol (VITAMIN D3) 1000 UNITS CAPS Take 3,000 Units by mouth daily with lunch.     . diltiazem (CARDIZEM) 30 MG tablet Take 1 tablet (30 mg total) by mouth once as needed for up to 1 dose. 14 tablet 0  . furosemide (LASIX) 40 MG tablet TAKE ONE TABLET BY MOUTH DAILY 90 tablet 3  . gabapentin (NEURONTIN) 100 MG  capsule Take 500 mg by mouth at bedtime.   0  . LORazepam (ATIVAN) 0.5 MG tablet Take 1 tablet (0.5 mg total) by mouth at bedtime. 30 tablet 2  . montelukast (SINGULAIR) 10 MG tablet TAKE ONE TABLET BY MOUTH DAILY (Patient taking differently: TAKE ONE TABLET BY MOUTH in the evening) 90 tablet 1  . oxyCODONE-acetaminophen (PERCOCET/ROXICET) 5-325 MG tablet Take 1 tablet by mouth every 6 (six) hours as needed for severe pain. 30 tablet 0  . potassium chloride SA (K-DUR,KLOR-CON) 20 MEQ tablet Take 2 tablets (40 mEq total) by mouth daily. 60 tablet 5  . pravastatin (PRAVACHOL) 40 MG tablet TAKE 1 TABLET DAILY BY MOUTH AT BEDTIME (Patient taking differently: TAKE 40mg  BY MOUTH AT BEDTIME) 90 tablet 1  . predniSONE (DELTASONE) 20 MG tablet Take 3 tablets (60 mg total) by mouth daily. (Patient taking differently: Take 30 mg by mouth daily. ) 90 tablet 0  . Probiotic Product (ALIGN) 4 MG CAPS Take 4 mg by mouth daily.     . SYMBICORT 160-4.5 MCG/ACT inhaler INHALE 1 TO 2 PUFFS BY MOUTH EVERY 12 HOURS THEN GARGLE AND SPIT AFTER USE 10.2 g 11  . theophylline (UNIPHYL) 400 MG 24 hr tablet TAKE 1 TABLET (400 MG TOTAL) BY MOUTH DAILY. (Patient taking differently: Take 200 mg by mouth 2 (two) times daily. ) 90 tablet 3  . traMADol (ULTRAM) 50 MG tablet Take 1-2 tablets (50-100 mg total) by mouth every 8 (eight) hours as needed. 60 tablet 0  . trimethoprim (TRIMPEX) 100 MG tablet Take 100 mg by mouth as needed (bladder problems during travel).     . VENTOLIN HFA 108 (90 Base) MCG/ACT inhaler Inhale 1-2 puffs into the lungs every 6 (six) hours as needed for wheezing or shortness of breath. 1 Inhaler 8  . diltiazem (CARDIZEM CD) 120 MG 24 hr capsule Take 1 capsule (120 mg total) by mouth daily. 30 capsule 11   No facility-administered medications prior to visit.      Allergies:   Simvastatin   Social History   Socioeconomic History  . Marital status: Married    Spouse name: Not on file  . Number of  children: Not on file  . Years of education: Not on file  . Highest education level: Not on file  Occupational History  . Occupation: retired  Scientific laboratory technician  . Financial resource strain: Not on file  . Food insecurity:    Worry: Not on file    Inability: Not on file  . Transportation needs:    Medical: Not on file    Non-medical: Not on file  Tobacco Use  . Smoking status: Former Smoker    Packs/day: 1.00    Years: 23.00    Pack years: 23.00    Last  attempt to quit: 06/14/1989    Years since quitting: 28.2  . Smokeless tobacco: Never Used  . Tobacco comment: smoked 1961-1991 , up to < 1 ppd  Substance and Sexual Activity  . Alcohol use: Yes    Alcohol/week: 8.4 oz    Types: 14 Glasses of wine per week    Comment: Wine  . Drug use: No  . Sexual activity: Not on file  Lifestyle  . Physical activity:    Days per week: Not on file    Minutes per session: Not on file  . Stress: Not on file  Relationships  . Social connections:    Talks on phone: Not on file    Gets together: Not on file    Attends religious service: Not on file    Active member of club or organization: Not on file    Attends meetings of clubs or organizations: Not on file    Relationship status: Not on file  Other Topics Concern  . Not on file  Social History Narrative  . Not on file     Family History:  The patient's family history includes COPD in her sister; Diabetes in her sister; Heart attack in her father; Heart attack (age of onset: 26) in her brother; Other in her son; Parkinsonism in her mother; Stroke in her mother.   ROS:   Please see the history of present illness.    ROS All other systems reviewed and are negative.   PHYSICAL EXAM:   VS:  BP 128/70 (BP Location: Right Arm, Patient Position: Sitting, Cuff Size: Normal)   Pulse 89   Ht 5\' 4"  (1.626 m)   Wt 120 lb 3.2 oz (54.5 kg)   BMI 20.63 kg/m    GEN: Well nourished, well developed, in no acute distress  HEENT: normal  Neck: no  JVD, carotid bruits, or masses Cardiac: RRR; no murmurs, rubs, or gallops,no edema  Respiratory:  clear to auscultation bilaterally, normal work of breathing GI: soft, nontender, nondistended, + BS MS: no deformity or atrophy  Skin: warm and dry, no rash Neuro:  Alert and Oriented x 3, Strength and sensation are intact Psych: euthymic mood, full affect  Wt Readings from Last 3 Encounters:  09/14/17 120 lb 3.2 oz (54.5 kg)  09/12/17 120 lb 1.6 oz (54.5 kg)  09/07/17 115 lb (52.2 kg)      Studies/Labs Reviewed:   EKG:  EKG is ordered today.  The ekg ordered today demonstrates normal sinus rhythm, frequent PACs  Recent Labs: 08/29/2017: ALT 17 09/07/2017: BUN 14; Creatinine, Ser 0.74; Hemoglobin 9.4; Magnesium 2.3; Platelets 261; Potassium 3.2; Sodium 134; TSH 2.546   Lipid Panel    Component Value Date/Time   CHOL 162 06/27/2017 0930   CHOL 281 (H) 06/19/2014 1016   TRIG 95.0 06/27/2017 0930   TRIG 58 06/19/2014 1016   TRIG 51 06/01/2006 0950   HDL 63.80 06/27/2017 0930   HDL 153 06/19/2014 1016   CHOLHDL 3 06/27/2017 0930   VLDL 19.0 06/27/2017 0930   LDLCALC 79 06/27/2017 0930   LDLCALC 116 (H) 06/19/2014 1016   LDLDIRECT 116.6 06/13/2013 0921    Additional studies/ records that were reviewed today include:   Echo 08/31/2017 LV EF: 60% -   65%  Study Conclusions  - Left ventricle: The cavity size was normal. Wall thickness was   normal. Systolic function was normal. The estimated ejection   fraction was in the range of 60% to 65%. Wall motion was  normal;   there were no regional wall motion abnormalities. Features are   consistent with a pseudonormal left ventricular filling pattern,   with concomitant abnormal relaxation and increased filling   pressure (grade 2 diastolic dysfunction). - Left atrium: The atrium was moderately dilated. - Tricuspid valve: There was mild-moderate regurgitation. - Pulmonary arteries: Systolic pressure was moderately increased.    PA peak pressure: 47 mm Hg (S).    ASSESSMENT:    1. Paroxysmal atrial fibrillation (HCC)   2. Giant cell arteritis ([place])   3. Essential hypertension   4. Hyperlipidemia, unspecified hyperlipidemia type   5. PAD (peripheral artery disease) (HCC)      PLAN:  In order of problems listed above:  1. Paroxysmal atrial fibrillation: She was able to convert on diltiazem, she still have very frequent PACs on EKG, will increase diltiazem CD to 180 mg daily.  She will need to monitor blood pressure carefully at home. CHA2DS2-Vasc score 3 (age, female, HTN)  2. Giant cell arteritis: Currently on steroid, she has bilateral blindness related to giant cell arteritis.  3. Hypertension: Blood pressure stable, increasing diltiazem CD to 180 mg daily for better rate control  4. Hyperlipidemia: On Pravachol 40 mg daily.  Last lipid panel obtained on 06/27/2017 showed a total cholesterol 162, HDL 63, LDL 79, triglyceride 95.  5. PAD: Right SFA occlusion, felt not to be related to an embolic event but more chronic in nature    Medication Adjustments/Labs and Tests Ordered: Current medicines are reviewed at length with the patient today.  Concerns regarding medicines are outlined above.  Medication changes, Labs and Tests ordered today are listed in the Patient Instructions below. Patient Instructions  Medication Instructions:   INCREASE DILTIAZEM CD TO 180 MG ONCE DAILY  Labwork:  Your physician recommends that you return for lab work in: [place].  Follow-Up:  Your physician recommends that you schedule a follow-up appointment in: [place]   If you need a refill on your cardiac medications before your next appointment, please call your pharmacy.       Hilbert Corrigan, [place]  09/16/2017 11:04 AM    [address], [place], [place]  [address] Phone: [phone]; Fax: [phone]

## 2017-09-14 NOTE — Patient Instructions (Signed)
Medication Instructions:   INCREASE DILTIAZEM CD TO 180 MG ONCE DAILY  Labwork:  Your physician recommends that you return for lab work in: [place].  Follow-Up:  Your physician recommends that you schedule a follow-up appointment in: [place]   If you need a refill on your cardiac medications before your next appointment, please call your pharmacy.

## 2017-09-15 ENCOUNTER — Telehealth: Payer: Self-pay | Admitting: *Deleted

## 2017-09-15 NOTE — Telephone Encounter (Signed)
Received call from Zenia Resides, [place] stating that [facility] had agreed to see the patient for her wound care needs.  I faxed an order, office visit note,op note and face sheet to [phone] and left a detailed message on their ([facility]) voice mail.

## 2017-09-16 ENCOUNTER — Encounter: Payer: Self-pay | Admitting: Physician Assistant

## 2017-09-16 ENCOUNTER — Telehealth: Payer: Self-pay | Admitting: *Deleted

## 2017-09-16 NOTE — Telephone Encounter (Signed)
Returned call to [place] rep to provide her with patients right groin wound size.  Per Dr Trula Slade the size is 2 cm x 1 cm x 1 cm.  She is going to provide this information to [facility] as they have agreed to provide home health/wound care.

## 2017-09-17 DIAGNOSIS — H540X33 Blindness right eye category 3, blindness left eye category 3: Secondary | ICD-10-CM | POA: Diagnosis not present

## 2017-09-17 DIAGNOSIS — I739 Peripheral vascular disease, unspecified: Secondary | ICD-10-CM | POA: Diagnosis not present

## 2017-09-17 DIAGNOSIS — I10 Essential (primary) hypertension: Secondary | ICD-10-CM | POA: Diagnosis not present

## 2017-09-17 DIAGNOSIS — J45909 Unspecified asthma, uncomplicated: Secondary | ICD-10-CM | POA: Diagnosis not present

## 2017-09-17 DIAGNOSIS — I480 Paroxysmal atrial fibrillation: Secondary | ICD-10-CM | POA: Diagnosis not present

## 2017-09-17 DIAGNOSIS — Z48812 Encounter for surgical aftercare following surgery on the circulatory system: Secondary | ICD-10-CM | POA: Diagnosis not present

## 2017-09-20 ENCOUNTER — Other Ambulatory Visit: Payer: Self-pay

## 2017-09-20 ENCOUNTER — Ambulatory Visit (INDEPENDENT_AMBULATORY_CARE_PROVIDER_SITE_OTHER): Payer: Medicare Other | Admitting: Vascular Surgery

## 2017-09-20 ENCOUNTER — Encounter: Payer: Self-pay | Admitting: Vascular Surgery

## 2017-09-20 VITALS — BP 140/89 | HR 89 | Temp 97.8°F | Resp 18 | Ht 64.0 in | Wt 120.4 lb

## 2017-09-20 DIAGNOSIS — L7634 Postprocedural seroma of skin and subcutaneous tissue following other procedure: Secondary | ICD-10-CM

## 2017-09-20 NOTE — Progress Notes (Signed)
Patient name: Crystal [name] MRN: [id number] DOB: May 02, 1946 Sex: female  REASON FOR VISIT: Right groin exploration low up  HPI: Crystal [name] is a 72 y.o. female here today for follow-up of right groin exploration on 08/29/2017.  She had presented with bilateral leg pain no pulse in her right foot and normal pulse in her left foot.  Duplex suggested acute occlusion of her right superficial femoral artery with embolus.  She underwent exploration via her right groin and this showed chronic SFA occlusion.  She did have some postop hematoma.  She is now seen for follow-up.  She has had serous fluid drainage from her incision.  There is no blood associated with this and no pus.  This has been a large volume of fluid.  She has had no fevers.  She does have equal bilateral leg swelling which she feels is related to [place]  Current Outpatient Medications  Medication Sig Dispense Refill  . acetaminophen (TYLENOL) 500 MG tablet Take 1,000 mg by mouth every 6 (six) hours as needed for headache.    Marland Kitchen apixaban (ELIQUIS) 5 MG TABS tablet Take 1 tablet (5 mg total) by mouth 2 (two) times daily. 60 tablet 11  . Calcium Carbonate-Vitamin D (CALCIUM 600/VITAMIN D PO) Take 1 tablet by mouth daily.     . Cholecalciferol (VITAMIN D3) 1000 UNITS CAPS Take 3,000 Units by mouth daily with lunch.     . diltiazem (CARDIZEM CD) 180 MG 24 hr capsule Take 1 capsule (180 mg total) by mouth daily. 90 capsule 3  . diltiazem (CARDIZEM) 30 MG tablet Take 1 tablet (30 mg total) by mouth once as needed for up to 1 dose. 14 tablet 0  . furosemide (LASIX) 40 MG tablet TAKE ONE TABLET BY MOUTH DAILY 90 tablet 3  . gabapentin (NEURONTIN) 100 MG capsule Take 500 mg by mouth at bedtime.   0  . LORazepam (ATIVAN) 0.5 MG tablet Take 1 tablet (0.5 mg total) by mouth at bedtime. 30 tablet 2  . montelukast (SINGULAIR) 10 MG tablet TAKE ONE TABLET BY MOUTH DAILY (Patient taking differently: TAKE ONE TABLET BY  MOUTH in the evening) 90 tablet 1  . oxyCODONE-acetaminophen (PERCOCET/ROXICET) 5-325 MG tablet Take 1 tablet by mouth every 6 (six) hours as needed for severe pain. 30 tablet 0  . potassium chloride SA (K-DUR,KLOR-CON) 20 MEQ tablet Take 2 tablets (40 mEq total) by mouth daily. 60 tablet 5  . pravastatin (PRAVACHOL) 40 MG tablet TAKE 1 TABLET DAILY BY MOUTH AT BEDTIME (Patient taking differently: TAKE 40mg  BY MOUTH AT BEDTIME) 90 tablet 1  . predniSONE (DELTASONE) 20 MG tablet Take 3 tablets (60 mg total) by mouth daily. (Patient taking differently: Take 30 mg by mouth daily. ) 90 tablet 0  . Probiotic Product (ALIGN) 4 MG CAPS Take 4 mg by mouth daily.     . SYMBICORT 160-4.5 MCG/ACT inhaler INHALE 1 TO 2 PUFFS BY MOUTH EVERY 12 HOURS THEN GARGLE AND SPIT AFTER USE 10.2 g 11  . theophylline (UNIPHYL) 400 MG 24 hr tablet TAKE 1 TABLET (400 MG TOTAL) BY MOUTH DAILY. (Patient taking differently: Take 200 mg by mouth 2 (two) times daily. ) 90 tablet 3  . traMADol (ULTRAM) 50 MG tablet Take 1-2 tablets (50-100 mg total) by mouth every 8 (eight) hours as needed. 60 tablet 0  . trimethoprim (TRIMPEX) 100 MG tablet Take 100 mg by mouth as needed (bladder problems during travel).     Enid Cutter HFA  108 (90 Base) MCG/ACT inhaler Inhale 1-2 puffs into the lungs every 6 (six) hours as needed for wheezing or shortness of breath. 1 Inhaler 8   No current facility-administered medications for this visit.      PHYSICAL EXAM: Vitals:   09/20/17 0938  BP: 140/89  Pulse: 89  Resp: 18  Temp: 97.8 F (36.6 C)  TempSrc: Oral  SpO2: 90%  Weight: 120 lb 6.4 oz (54.6 kg)  Height: 5\' 4"  (1.626 m)    GENERAL: The patient is a well-nourished female, in no acute distress. The vital signs are documented above. Less left dorsalis pedis pulse and no pulse on the right.  Her right groin does have a great deal of serous drainage.  I did probe this and it does track approximately 2cm laterally.  MEDICAL  ISSUES: Stable overall.  Instructed her husband on packing of this.  We will see her again in 3 weeks.  She will notify should she develop any erythema or fever.   Rosetta Posner, MD FACS [facility] Tel [phone] Pager [phone]

## 2017-09-24 ENCOUNTER — Other Ambulatory Visit: Payer: Self-pay | Admitting: Internal Medicine

## 2017-09-27 DIAGNOSIS — I480 Paroxysmal atrial fibrillation: Secondary | ICD-10-CM | POA: Diagnosis not present

## 2017-09-28 LAB — CBC
Hematocrit: 36.7 % (ref 34.0–46.6)
Hemoglobin: 12 g/dL (ref 11.1–15.9)
MCH: 34 pg — ABNORMAL HIGH (ref 26.6–33.0)
MCHC: 32.7 g/dL (ref 31.5–35.7)
MCV: 104 fL — ABNORMAL HIGH (ref 79–97)
Platelets: 447 10*3/uL — ABNORMAL HIGH (ref 150–379)
RBC: 3.53 x10E6/uL — ABNORMAL LOW (ref 3.77–5.28)
RDW: 16.5 % — ABNORMAL HIGH (ref 12.3–15.4)
WBC: 12 10*3/uL — ABNORMAL HIGH (ref 3.4–10.8)

## 2017-10-03 DIAGNOSIS — Z7952 Long term (current) use of systemic steroids: Secondary | ICD-10-CM | POA: Diagnosis not present

## 2017-10-03 DIAGNOSIS — M316 Other giant cell arteritis: Secondary | ICD-10-CM | POA: Diagnosis not present

## 2017-10-11 ENCOUNTER — Ambulatory Visit (INDEPENDENT_AMBULATORY_CARE_PROVIDER_SITE_OTHER): Payer: Self-pay | Admitting: Vascular Surgery

## 2017-10-11 ENCOUNTER — Encounter: Payer: Self-pay | Admitting: Vascular Surgery

## 2017-10-11 ENCOUNTER — Other Ambulatory Visit: Payer: Self-pay

## 2017-10-11 VITALS — BP 137/67 | HR 70 | Temp 97.4°F | Resp 18 | Ht 64.0 in | Wt 120.0 lb

## 2017-10-11 DIAGNOSIS — L7634 Postprocedural seroma of skin and subcutaneous tissue following other procedure: Secondary | ICD-10-CM

## 2017-10-11 NOTE — Progress Notes (Signed)
Patient name: Crystal [name] MRN: [id number] DOB: June 22, 1945 Sex: female  REASON FOR VISIT: Follow-up right groin serous collection  HPI: Crystal [name] is a 72 y.o. female today for follow-up.  Has done extremely well with now minimal output from the right groin.  He has less than 5 mm opening with good granulation tissue and no tracking  Current Outpatient Medications  Medication Sig Dispense Refill  . acetaminophen (TYLENOL) 500 MG tablet Take 1,000 mg by mouth every 6 (six) hours as needed for headache.    Marland Kitchen apixaban (ELIQUIS) 5 MG TABS tablet Take 1 tablet (5 mg total) by mouth 2 (two) times daily. 60 tablet 11  . Calcium Carbonate-Vitamin D (CALCIUM 600/VITAMIN D PO) Take 1 tablet by mouth daily.     . Cholecalciferol (VITAMIN D3) 1000 UNITS CAPS Take 3,000 Units by mouth daily with lunch.     . diltiazem (CARDIZEM CD) 180 MG 24 hr capsule Take 1 capsule (180 mg total) by mouth daily. 90 capsule 3  . diltiazem (CARDIZEM) 30 MG tablet Take 1 tablet (30 mg total) by mouth once as needed for up to 1 dose. 14 tablet 0  . furosemide (LASIX) 40 MG tablet TAKE ONE TABLET BY MOUTH DAILY 90 tablet 3  . gabapentin (NEURONTIN) 100 MG capsule Take 500 mg by mouth at bedtime.   0  . LORazepam (ATIVAN) 0.5 MG tablet Take 1 tablet (0.5 mg total) by mouth at bedtime. 30 tablet 2  . montelukast (SINGULAIR) 10 MG tablet TAKE ONE TABLET BY MOUTH DAILY (Patient taking differently: TAKE ONE TABLET BY MOUTH in the evening) 90 tablet 1  . oxyCODONE-acetaminophen (PERCOCET/ROXICET) 5-325 MG tablet Take 1 tablet by mouth every 6 (six) hours as needed for severe pain. 30 tablet 0  . potassium chloride SA (K-DUR,KLOR-CON) 20 MEQ tablet Take 2 tablets (40 mEq total) by mouth daily. 60 tablet 5  . pravastatin (PRAVACHOL) 40 MG tablet TAKE 1 TABLET DAILY BY MOUTH AT BEDTIME (Patient taking differently: TAKE 40mg  BY MOUTH AT BEDTIME) 90 tablet 1  . predniSONE (DELTASONE) 20 MG tablet Take  3 tablets (60 mg total) by mouth daily. (Patient taking differently: Take 30 mg by mouth daily. ) 90 tablet 0  . Probiotic Product (ALIGN) 4 MG CAPS Take 4 mg by mouth daily.     . SYMBICORT 160-4.5 MCG/ACT inhaler INHALE 1 OR 2 PUFFS INTO THE LUNGS EVERY 12 HOURS. THEN GARGLE AND SPIT AFTER USE 6 g 10  . theophylline (UNIPHYL) 400 MG 24 hr tablet TAKE 1 TABLET (400 MG TOTAL) BY MOUTH DAILY. (Patient taking differently: Take 200 mg by mouth 2 (two) times daily. ) 90 tablet 3  . traMADol (ULTRAM) 50 MG tablet Take 1-2 tablets (50-100 mg total) by mouth every 8 (eight) hours as needed. 60 tablet 0  . trimethoprim (TRIMPEX) 100 MG tablet Take 100 mg by mouth as needed (bladder problems during travel).     . VENTOLIN HFA 108 (90 Base) MCG/ACT inhaler Inhale 1-2 puffs into the lungs every 6 (six) hours as needed for wheezing or shortness of breath. 1 Inhaler 8   No current facility-administered medications for this visit.      PHYSICAL EXAM: Vitals:   10/11/17 0849  BP: 137/67  Pulse: 70  Resp: 18  Temp: (!) 97.4 F (36.3 C)  TempSrc: Oral  SpO2: 97%  Weight: 120 lb (54.4 kg)  Height: 5\' 4"  (1.626 m)    GENERAL: The patient is a well-nourished female,  in no acute distress. The vital signs are documented above. Less than 5 mm opening in her right groin with no tracking  MEDICAL ISSUES: Resolution of her postop seroma.  No evidence of lower extremity ischemia.  Will see [place] again on an as-needed basis   Rosetta Posner, MD [facility] Tel [phone] Pager [phone]

## 2017-10-17 ENCOUNTER — Other Ambulatory Visit: Payer: Self-pay | Admitting: Internal Medicine

## 2017-10-17 NOTE — Telephone Encounter (Addendum)
[place] Controlled Substance Database checked. Last filled on  09/21/17

## 2017-10-20 ENCOUNTER — Other Ambulatory Visit: Payer: Self-pay | Admitting: Internal Medicine

## 2017-10-24 ENCOUNTER — Telehealth: Payer: Self-pay | Admitting: Emergency Medicine

## 2017-10-24 MED ORDER — VENTOLIN HFA 108 (90 BASE) MCG/ACT IN AERS: 1.0000 | INHALATION_SPRAY | Freq: Four times a day (QID) | RESPIRATORY_TRACT | 8 refills | Status: DC | PRN

## 2017-10-24 NOTE — Telephone Encounter (Signed)
Pharmacy sent fax stating that Crystal [name] is not covered by pts insurance. Please advise if there is an alternative.

## 2017-10-24 NOTE — Telephone Encounter (Signed)
We can try ventolin - not sure if it is covered - sent

## 2017-10-25 NOTE — Telephone Encounter (Signed)
LVM informing pt on husband mobile number.

## 2017-11-09 ENCOUNTER — Ambulatory Visit: Payer: Medicare Other | Admitting: Neurology

## 2017-11-21 ENCOUNTER — Other Ambulatory Visit: Payer: Self-pay | Admitting: Internal Medicine

## 2017-12-12 ENCOUNTER — Encounter: Payer: Self-pay | Admitting: Cardiology

## 2017-12-13 NOTE — Progress Notes (Signed)
HPI: FU atrial fibrillation. 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 Echo 3/19 showed normal LV function; grade 2 DD, moderate LAE; mild to moderate TR. Pt converted to sinus spontaneously. Since last seen, patient denies dyspnea, chest pain or syncope.  She has not fallen.  Occasional brief palpitations.  Current Outpatient Medications  Medication Sig Dispense Refill  . acetaminophen (TYLENOL) 500 MG tablet Take 1,000 mg by mouth every 6 (six) hours as needed for headache.    Marland Kitchen apixaban (ELIQUIS) 5 MG TABS tablet Take 5 mg by mouth daily.    . Calcium Carbonate-Vitamin D (CALCIUM 600/VITAMIN D PO) Take 1 tablet by mouth daily.     Marland Kitchen CARTIA XT 120 MG 24 hr capsule Take 120 mg by mouth daily.   10  . Cholecalciferol (VITAMIN D3) 1000 UNITS CAPS Take 3,000 Units by mouth daily with lunch.     . diltiazem (CARDIZEM) 30 MG tablet Take 1 tablet (30 mg total) by mouth once as needed for up to 1 dose. 14 tablet 0  . furosemide (LASIX) 40 MG tablet TAKE ONE TABLET BY MOUTH DAILY 90 tablet 3  . gabapentin (NEURONTIN) 100 MG capsule Take 500 mg by mouth at bedtime.   0  . LORazepam (ATIVAN) 0.5 MG tablet TAKE ONE TABLET BY MOUTH AT BEDTIME 30 tablet 2  . montelukast (SINGULAIR) 10 MG tablet Take 1 tablet (10 mg total) by mouth daily. 90 tablet 1  . potassium chloride SA (K-DUR,KLOR-CON) 20 MEQ tablet Take 2 tablets (40 mEq total) by mouth daily. 60 tablet 5  . pravastatin (PRAVACHOL) 40 MG tablet TAKE 1 TABLET DAILY BY MOUTH AT BEDTIME 90 tablet 1  . predniSONE (DELTASONE) 10 MG tablet See Notes to  Pharmacy    . Probiotic Product (ALIGN) 4 MG CAPS Take 4 mg by mouth daily.     . SYMBICORT 160-4.5 MCG/ACT inhaler INHALE 1 OR 2 PUFFS INTO THE LUNGS EVERY 12 HOURS. THEN GARGLE AND SPIT AFTER USE 6 g 10  . theophylline (UNIPHYL) 400 MG 24 hr tablet TAKE 1 TABLET (400 MG TOTAL) BY MOUTH DAILY. (Patient taking differently: Take 200 mg by mouth 2 (two) times daily. ) 90 tablet 3  . traMADol (ULTRAM) 50 MG tablet Take 1-2 tablets (50-100 mg total) by mouth every 8 (eight) hours as needed. 60 tablet 0  . VENTOLIN HFA 108 (90 Base) MCG/ACT inhaler Inhale 1-2 puffs into the lungs every 6 (six) hours as needed for wheezing or shortness of breath. 1 Inhaler 8  . oxyCODONE-acetaminophen (PERCOCET/ROXICET) 5-325 MG tablet Take 1 tablet by mouth every 6 (six) hours as needed for severe pain. (Patient not taking: Reported on 12/21/2017) 30 tablet 0  . trimethoprim (TRIMPEX) 100 MG tablet Take 100 mg by mouth as needed (bladder problems during travel).      No current facility-administered medications for this visit.      Past Medical History:  Diagnosis Date  . Adenomatous colon polyp 2007 & 2010    Dr [name]  . Allergy    seasonal  . Arthritis   . Cataract   .  Diverticulosis   . 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 Hyperlipidemia   . Hypertension   . IBS (irritable bowel syndrome)   . MVP (mitral valve prolapse)   . Paroxysmal atrial fibrillation with rapid ventricular response ([place]) 08/29/2017  . Peripheral vascular disease ([place])   . RAD (reactive airway disease)     Past Surgical History:  Procedure Laterality Date  . ABDOMINAL HYSTERECTOMY     with USO for dysfunctionall menses  . APPENDECTOMY    . ARTERY BIOPSY Bilateral 07/21/2017   Procedure: BIOPSY BILATERAL TEMPORAL ARTERY;  Surgeon: [name], MD;  Location: [facility];  Service: Vascular;  Laterality: Bilateral;  . CATARACT EXTRACTION W/ INTRAOCULAR LENS IMPLANT  2013   bilateral; Dr [name]  . COLONOSCOPY   2013   negative , due 2018; Dr [name]  . COLONOSCOPY W/ POLYPECTOMY      X2 ; diverticulosis  . EMBOLECTOMY Right 08/29/2017   Procedure: Right Groin Exploration  ;  Surgeon: Rosetta Posner, MD;  Location: [facility];  Service: Vascular;  Laterality: Right;  . LUMBAR LAMINECTOMY  2000   Dr Vertell Limber  . SHOULDER SURGERY     R shoulder  . TONSILLECTOMY      Social History   Socioeconomic History  . Marital status: Married    Spouse name: Not on file  . Number of children: Not on file  . Years of education: Not on file  . Highest education level: Not on file  Occupational History  . Occupation: retired  Scientific laboratory technician  . Financial resource strain: Not on file  . Food insecurity:    Worry: Not on file    Inability: Not on file  . Transportation needs:    Medical: Not on file    Non-medical: Not on file  Tobacco Use  . Smoking status: Former Smoker    Packs/day: 1.00    Years: 23.00    Pack years: 23.00    Last attempt to quit: 06/14/1989    Years since quitting: 28.5  . Smokeless tobacco: Never Used  . Tobacco comment: smoked 1961-1991 , up to < 1 ppd  Substance and Sexual Activity  . Alcohol use: Yes    Alcohol/week: 8.4 oz    Types: 14 Glasses of wine per week    Comment: Wine  . Drug use: No  . Sexual activity: Not on file  Lifestyle  . Physical activity:    Days per week: Not on file    Minutes per session: Not on file  . Stress: Not on file  Relationships  . Social connections:    Talks on phone: Not on file    Gets together: Not on file    Attends religious service: Not on file    Active member of club or organization: Not on file    Attends meetings of clubs or organizations: Not on file    Relationship status: Not on file  . Intimate partner violence:    Fear of current or ex partner: Not on file    Emotionally abused: Not on file    Physically abused: Not on file    Forced sexual activity: Not on file  Other Topics Concern  . Not on file  Social History  Narrative  . Not on file    Family History  Problem Relation Age of Onset  . Heart attack Brother 21  . Parkinsonism Mother   . Stroke Mother   . Heart attack Father  pre 55  . Diabetes Sister   . COPD Sister        X 3  . Other Son        suicide 2016  . Colon cancer Neg Hx   . Cancer Neg Hx   . Esophageal cancer Neg Hx   . Liver cancer Neg Hx   . Pancreatic cancer Neg Hx   . Rectal cancer Neg Hx   . Stomach cancer Neg Hx     ROS: no fevers or chills, productive cough, hemoptysis, dysphasia, odynophagia, melena, hematochezia, dysuria, hematuria, rash, seizure activity, orthopnea, PND, pedal edema, claudication. Remaining systems are negative.  Physical Exam: Well-developed well-nourished in no acute distress.  Skin is warm and dry.  HEENT is normal.  Patient is blind. Neck is supple.  Chest is clear to auscultation with normal expansion.  Cardiovascular exam is regular rate and rhythm.  Abdominal exam nontender or distended. No masses palpated. Extremities show no edema. neuro grossly intact  ECG-sinus rhythm at a rate of 86.  PACs.  Cannot rule out septal infarct.  Personally reviewed  A/P  1 paroxysmal atrial fibrillation-patient remains in sinus rhythm on examination.  Continue Cardizem for rate control if atrial fibrillation recurs.  Increase apixaban to 5 mg twice daily.  Check hemoglobin and renal function.  2 hypertension-blood pressure is controlled.  Continue present medications.  3 chronic SFA occlusion-continue statin.  She is not on aspirin given need for anticoagulation.  This was felt not to be an embolic event.  4 giant cell arteritis-management per rheumatology.  5 Hyperlipidemia-continue statin.  [name], MD

## 2017-12-21 ENCOUNTER — Ambulatory Visit: Payer: Medicare Other | Admitting: Cardiology

## 2017-12-21 ENCOUNTER — Encounter: Payer: Self-pay | Admitting: Cardiology

## 2017-12-21 VITALS — BP 132/68 | HR 76 | Ht 63.0 in | Wt 113.0 lb

## 2017-12-21 DIAGNOSIS — I10 Essential (primary) hypertension: Secondary | ICD-10-CM | POA: Diagnosis not present

## 2017-12-21 DIAGNOSIS — I480 Paroxysmal atrial fibrillation: Secondary | ICD-10-CM | POA: Diagnosis not present

## 2017-12-21 DIAGNOSIS — I743 Embolism and thrombosis of arteries of the lower extremities: Secondary | ICD-10-CM | POA: Diagnosis not present

## 2017-12-21 MED ORDER — APIXABAN 5 MG PO TABS
5.0000 mg | ORAL_TABLET | Freq: Two times a day (BID) | ORAL | 6 refills | Status: DC
Start: 2017-12-21 — End: 2018-03-30

## 2017-12-21 NOTE — Patient Instructions (Signed)
Medication Instructions:   INCREASE ELIQUIS TO 5 MG TWICE DAILY  Follow-Up:  Your physician wants you to follow-up in: Crystal [name] will receive a reminder letter in the mail two months in advance. If you don't receive a letter, please call our office to schedule the follow-up appointment.   If you need a refill on your cardiac medications before your next appointment, please call your pharmacy.

## 2017-12-22 LAB — CBC
Hematocrit: 44.6 % (ref 34.0–46.6)
Hemoglobin: 14.7 g/dL (ref 11.1–15.9)
MCH: 33 pg (ref 26.6–33.0)
MCHC: 33 g/dL (ref 31.5–35.7)
MCV: 100 fL — ABNORMAL HIGH (ref 79–97)
Platelets: 432 10*3/uL (ref 150–450)
RBC: 4.46 x10E6/uL (ref 3.77–5.28)
RDW: 13.8 % (ref 12.3–15.4)
WBC: 11.1 10*3/uL — ABNORMAL HIGH (ref 3.4–10.8)

## 2017-12-22 LAB — BASIC METABOLIC PANEL
BUN/Creatinine Ratio: 18 (ref 12–28)
BUN: 15 mg/dL (ref 8–27)
CO2: 27 mmol/L (ref 20–29)
Calcium: 10.3 mg/dL (ref 8.7–10.3)
Chloride: 92 mmol/L — ABNORMAL LOW (ref 96–106)
Creatinine, Ser: 0.85 mg/dL (ref 0.57–1.00)
GFR calc Af Amer: 79 mL/min/{1.73_m2} (ref >59)
GFR calc non Af Amer: 68 mL/min/{1.73_m2} (ref >59)
Glucose: 94 mg/dL (ref 65–99)
Potassium: 4.2 mmol/L (ref 3.5–5.2)
Sodium: 138 mmol/L (ref 134–144)

## 2017-12-23 ENCOUNTER — Encounter: Payer: Self-pay | Admitting: *Deleted

## 2018-01-02 DIAGNOSIS — M316 Other giant cell arteritis: Secondary | ICD-10-CM | POA: Diagnosis not present

## 2018-01-02 DIAGNOSIS — Z7952 Long term (current) use of systemic steroids: Secondary | ICD-10-CM | POA: Diagnosis not present

## 2018-01-09 DIAGNOSIS — M8589 Other specified disorders of bone density and structure, multiple sites: Secondary | ICD-10-CM | POA: Diagnosis not present

## 2018-01-09 DIAGNOSIS — M810 Age-related osteoporosis without current pathological fracture: Secondary | ICD-10-CM | POA: Diagnosis not present

## 2018-01-16 ENCOUNTER — Other Ambulatory Visit: Payer: Self-pay | Admitting: Internal Medicine

## 2018-01-16 NOTE — Telephone Encounter (Signed)
[place] Controlled Substance Database checked. Last filled on 12/20/17

## 2018-01-18 DIAGNOSIS — Z1231 Encounter for screening mammogram for malignant neoplasm of breast: Secondary | ICD-10-CM | POA: Diagnosis not present

## 2018-01-18 LAB — HM MAMMOGRAPHY

## 2018-01-24 ENCOUNTER — Encounter: Payer: Self-pay | Admitting: Internal Medicine

## 2018-02-05 NOTE — Patient Instructions (Addendum)
  Medications reviewed and updated.  Changes include increasing ativan to 1 mg at bedtime.   Your prescription(s) have been submitted to your pharmacy. Please take as directed and contact our office if you believe you are having problem(s) with the medication(s).   Please followup in 6 months

## 2018-02-05 NOTE — Progress Notes (Signed)
Subjective:    Patient ID: Crystal [name], female    DOB: 1946-04-01, 72 y.o.   MRN: [id number]  HPI The patient is here for follow up.  Osteopenia: Her rheumatologist prescribed Fosamax for her and she had questions regarding this medication.  She was not familiar with what it was or how it worked.  She wanted to discuss this further.  She has not started taking it.   Temporal arteritis with bilateral vision loss: She is following with rheumatology and taking 7.5 mg of prednisone daily.  Joint pain -she continues to take tramadol and Tylenol as needed for the pain.  This does control most of her pain.    Anxiety: She has struggled with anxiety most of her life, but it is worse now since she lost her vision.  She has been taking the lorazepam at night and it does help her sleep, but she is still not sleeping through the night.  She wondered if increasing it would help.  She does not feel that she needs anything on a daily basis or anything during the day.     Hyperlipidemia: She is taking her medication daily. She is compliant with a low fat/cholesterol diet.    PAFIB, Hypertension: She is taking her medication daily. She is compliant with a low sodium diet.  She denies chest pain, palpitations, edema, shortness of breath and regular headaches. She is not exercising regularly.  She does not monitor her blood pressure at home.       Medications and allergies reviewed with patient and updated if appropriate.  Patient Active Problem List   Diagnosis Date Noted  . Femoral artery thrombosis, right ([place]) 09/01/2017  . Paroxysmal atrial fibrillation with rapid ventricular response ([place]) 08/29/2017  . Paroxysmal atrial fibrillation ([facility])   . Herpes zoster without complication 71/24/5809  . Complete loss of vision 07/27/2017  . Jaw claudication 07/27/2017  . Temporal arteritis ([place]) 07/18/2017  . Vision, loss, sudden, bilateral 07/18/2017  . 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 Parotitis  10/06/2016  . Osteoarthritis 06/25/2016  . Anxiety 06/25/2016  . Back pain 07/18/2015  . 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 Osteopenia 06/03/2008  . History of colonic polyps 06/03/2008  . INTENTION TREMOR 12/06/2007  . IBS 10/16/2007  . Hyperlipidemia 06/06/2007  . Essential hypertension 06/06/2007  . Chronic obstructive airway disease with asthma ([place]) 02/23/2007    Current Outpatient Medications on File Prior to Visit  Medication Sig Dispense Refill  . acetaminophen (TYLENOL) 500 MG tablet Take 1,000 mg by mouth every 6 (six) hours as needed for headache.    Marland Kitchen apixaban (ELIQUIS) 5 MG TABS tablet Take 1 tablet (5 mg total) by mouth 2 (two) times daily. 60 tablet 6  . Calcium Carbonate-Vitamin D (CALCIUM 600/VITAMIN D PO) Take 1 tablet by mouth daily.     Marland Kitchen CARTIA XT 120 MG 24 hr capsule Take 120 mg by mouth daily.   10  . Cholecalciferol (VITAMIN D3) 1000 UNITS CAPS Take 3,000 Units by mouth daily with lunch.     . diltiazem (CARDIZEM) 30 MG tablet Take 1 tablet (30 mg total) by mouth once as needed for up to 1 dose. 14 tablet 0  . furosemide (LASIX) 40 MG tablet TAKE ONE TABLET BY MOUTH DAILY 90 tablet 3  . gabapentin (NEURONTIN) 100 MG capsule Take 500 mg by mouth at bedtime.   0  . montelukast (SINGULAIR) 10 MG tablet Take 1 tablet (10 mg total)  by mouth daily. 90 tablet 1  . potassium chloride SA (K-DUR,KLOR-CON) 20 MEQ tablet Take 2 tablets (40 mEq total) by mouth daily. 60 tablet 5  . pravastatin (PRAVACHOL) 40 MG tablet TAKE 1 TABLET DAILY BY MOUTH AT BEDTIME 90 tablet 1  . predniSONE (DELTASONE) 10 MG tablet See Notes to Pharmacy    . Probiotic Product (ALIGN) 4 MG CAPS Take 4 mg by mouth daily.     . SYMBICORT 160-4.5 MCG/ACT inhaler INHALE 1 OR 2 PUFFS INTO THE LUNGS EVERY 12 HOURS. THEN GARGLE AND SPIT AFTER USE 6 g 10  . theophylline (UNIPHYL) 400 MG 24 hr tablet TAKE 1 TABLET (400 MG TOTAL) BY MOUTH DAILY. (Patient taking differently:  Take 200 mg by mouth 2 (two) times daily. ) 90 tablet 3  . traMADol (ULTRAM) 50 MG tablet Take 1-2 tablets (50-100 mg total) by mouth every 8 (eight) hours as needed. 60 tablet 0  . trimethoprim (TRIMPEX) 100 MG tablet Take 100 mg by mouth as needed (bladder problems during travel).     . VENTOLIN HFA 108 (90 Base) MCG/ACT inhaler Inhale 1-2 puffs into the lungs every 6 (six) hours as needed for wheezing or shortness of breath. 1 Inhaler 8   No current facility-administered medications on file prior to visit.     Past Medical History:  Diagnosis Date  . Adenomatous colon polyp 2007 & 2010    Dr [name]  . Allergy    seasonal  . Arthritis   . Cataract   . Diverticulosis   . Femoral artery thrombosis, right ([place] [facility]) 09/01/2017  . Hyperlipidemia   . Hypertension   . IBS (irritable bowel syndrome)   . MVP (mitral valve prolapse)   . Paroxysmal atrial fibrillation with rapid ventricular response ([place]) 08/29/2017  . Peripheral vascular disease ([place])   . RAD (reactive airway disease)     Past Surgical History:  Procedure Laterality Date  . ABDOMINAL HYSTERECTOMY     with USO for dysfunctionall menses  . APPENDECTOMY    . ARTERY BIOPSY Bilateral 07/21/2017   Procedure: BIOPSY BILATERAL TEMPORAL ARTERY;  Surgeon: [name], MD;  Location: [facility];  Service: Vascular;  Laterality: Bilateral;  . CATARACT EXTRACTION W/ INTRAOCULAR LENS IMPLANT  2013   bilateral; Dr [name]  . COLONOSCOPY  2013   negative , due 2018; Dr [name]  . COLONOSCOPY W/ POLYPECTOMY      X2 ; diverticulosis  . EMBOLECTOMY Right 08/29/2017   Procedure: Right Groin Exploration  ;  Surgeon: Rosetta Posner, MD;  Location: [facility];  Service: Vascular;  Laterality: Right;  . LUMBAR LAMINECTOMY  2000   Dr Vertell Limber  . SHOULDER SURGERY     R shoulder  . TONSILLECTOMY      Social History   Socioeconomic History  . Marital status: Married    Spouse name: Not on file  . Number of children: Not on file  . Years of  education: Not on file  . Highest education level: Not on file  Occupational History  . Occupation: retired  Scientific laboratory technician  . Financial resource strain: Not on file  . Food insecurity:    Worry: Not on file    Inability: Not on file  . Transportation needs:    Medical: Not on file    Non-medical: Not on file  Tobacco Use  . Smoking status: Former Smoker    Packs/day: 1.00    Years: 23.00    Pack years: 23.00  Last attempt to quit: 06/14/1989    Years since quitting: 28.6  . Smokeless tobacco: Never Used  . Tobacco comment: smoked 1961-1991 , up to < 1 ppd  Substance and Sexual Activity  . Alcohol use: Yes    Alcohol/week: 14.0 standard drinks    Types: 14 Glasses of wine per week    Comment: Wine  . Drug use: No  . Sexual activity: Not on file  Lifestyle  . Physical activity:    Days per week: Not on file    Minutes per session: Not on file  . Stress: Not on file  Relationships  . Social connections:    Talks on phone: Not on file    Gets together: Not on file    Attends religious service: Not on file    Active member of club or organization: Not on file    Attends meetings of clubs or organizations: Not on file    Relationship status: Not on file  Other Topics Concern  . Not on file  Social History Narrative  . Not on file    Family History  Problem Relation Age of Onset  . Heart attack Brother 15  . Parkinsonism Mother   . Stroke Mother   . Heart attack Father        pre 61  . Diabetes Sister   . COPD Sister        X 3  . Other Son        suicide 2016  . Colon cancer Neg Hx   . Cancer Neg Hx   . Esophageal cancer Neg Hx   . Liver cancer Neg Hx   . Pancreatic cancer Neg Hx   . Rectal cancer Neg Hx   . Stomach cancer Neg Hx     Review of Systems  Constitutional: Negative for chills and fever.  Cardiovascular: Negative for chest pain and palpitations.  Neurological: Positive for light-headedness (occ) and headaches. Negative for dizziness.    Psychiatric/Behavioral: The patient is nervous/anxious.        Objective:   Vitals:   02/06/18 1446  BP: (!) 148/72  Pulse: 74  Resp: 16  Temp: 98.8 F (37.1 C)  SpO2: 93%   BP Readings from Last 3 Encounters:  02/06/18 (!) 148/72  12/21/17 132/68  10/11/17 137/67   Wt Readings from Last 3 Encounters:  02/06/18 114 lb (51.7 kg)  12/21/17 113 lb (51.3 kg)  10/11/17 120 lb (54.4 kg)   Body mass index is 20.19 kg/m.   Physical Exam    Constitutional: Appears well-developed and well-nourished. No distress.  HENT:  Head: Normocephalic and atraumatic.  Neck: Neck supple. No tracheal deviation present. No thyromegaly present.  No cervical lymphadenopathy Cardiovascular: Normal rate, regular rhythm and normal heart sounds.   No murmur heard. No carotid bruit .  1 + b/l pitting  edema Pulmonary/Chest: Effort normal and breath sounds normal. No respiratory distress. No has no wheezes. No rales.  Skin: Skin is warm and dry. Not diaphoretic.  Psychiatric: Normal mood and affect. Behavior is normal.      Assessment & Plan:    See Problem List for Assessment and Plan of chronic medical problems.

## 2018-02-06 ENCOUNTER — Ambulatory Visit: Payer: Medicare Other | Admitting: Internal Medicine

## 2018-02-06 ENCOUNTER — Encounter: Payer: Self-pay | Admitting: Internal Medicine

## 2018-02-06 VITALS — BP 148/72 | HR 74 | Temp 98.8°F | Resp 16 | Ht 63.0 in | Wt 114.0 lb

## 2018-02-06 DIAGNOSIS — F419 Anxiety disorder, unspecified: Secondary | ICD-10-CM

## 2018-02-06 DIAGNOSIS — I10 Essential (primary) hypertension: Secondary | ICD-10-CM | POA: Diagnosis not present

## 2018-02-06 DIAGNOSIS — I480 Paroxysmal atrial fibrillation: Secondary | ICD-10-CM | POA: Diagnosis not present

## 2018-02-06 DIAGNOSIS — E785 Hyperlipidemia, unspecified: Secondary | ICD-10-CM | POA: Diagnosis not present

## 2018-02-06 DIAGNOSIS — M1991 Primary osteoarthritis, unspecified site: Secondary | ICD-10-CM

## 2018-02-06 DIAGNOSIS — M8580 Other specified disorders of bone density and structure, unspecified site: Secondary | ICD-10-CM

## 2018-02-06 MED ORDER — LORAZEPAM 1 MG PO TABS: 1.0000 mg | ORAL_TABLET | Freq: Every day | ORAL | 2 refills | Status: DC

## 2018-02-06 NOTE — Assessment & Plan Note (Signed)
Following with cardiology Continue statin

## 2018-02-06 NOTE — Assessment & Plan Note (Signed)
Pain in several joints  taking tramadol and Tylenol as needed Effective, continue

## 2018-02-06 NOTE — Assessment & Plan Note (Signed)
Started on fosamax by rheum Discussed medication and recommended trying it

## 2018-02-06 NOTE — Assessment & Plan Note (Signed)
Following with cardiology No longer in atrial fibrillation Taking Eliquis

## 2018-02-06 NOTE — Assessment & Plan Note (Signed)
Taking Ativan at night and it does help, but is not working enough and she would ideally like to increase the dose Will increase to 1 mg at that time Defer daily SSRI or anything during the day Follow-up in 6 months

## 2018-02-06 NOTE — Assessment & Plan Note (Signed)
BP elevated but was better controlled in recent past Continue current medications

## 2018-02-08 ENCOUNTER — Telehealth: Payer: Self-pay

## 2018-02-08 NOTE — Telephone Encounter (Signed)
Handicap placard has been sent in the mail to patient.

## 2018-02-17 DIAGNOSIS — M47812 Spondylosis without myelopathy or radiculopathy, cervical region: Secondary | ICD-10-CM | POA: Diagnosis not present

## 2018-02-25 ENCOUNTER — Emergency Department (HOSPITAL_COMMUNITY)
Admission: EM | Admit: 2018-02-25 | Discharge: 2018-02-25 | Disposition: A | Discharge status: Home / Self Care | Payer: Medicare Other | Attending: Emergency Medicine | Admitting: Emergency Medicine

## 2018-02-25 ENCOUNTER — Other Ambulatory Visit: Payer: Self-pay

## 2018-02-25 ENCOUNTER — Encounter (HOSPITAL_COMMUNITY): Payer: Self-pay

## 2018-02-25 DIAGNOSIS — I10 Essential (primary) hypertension: Secondary | ICD-10-CM | POA: Diagnosis not present

## 2018-02-25 DIAGNOSIS — Z79899 Other long term (current) drug therapy: Secondary | ICD-10-CM | POA: Diagnosis not present

## 2018-02-25 DIAGNOSIS — Y929 Unspecified place or not applicable: Secondary | ICD-10-CM | POA: Insufficient documentation

## 2018-02-25 DIAGNOSIS — Y999 Unspecified external cause status: Secondary | ICD-10-CM | POA: Diagnosis not present

## 2018-02-25 DIAGNOSIS — X58XXXA Exposure to other specified factors, initial encounter: Secondary | ICD-10-CM | POA: Diagnosis not present

## 2018-02-25 DIAGNOSIS — Z7901 Long term (current) use of anticoagulants: Secondary | ICD-10-CM | POA: Diagnosis not present

## 2018-02-25 DIAGNOSIS — Y939 Activity, unspecified: Secondary | ICD-10-CM | POA: Diagnosis not present

## 2018-02-25 DIAGNOSIS — S3992XA Unspecified injury of lower back, initial encounter: Secondary | ICD-10-CM | POA: Diagnosis present

## 2018-02-25 DIAGNOSIS — Z87891 Personal history of nicotine dependence: Secondary | ICD-10-CM | POA: Insufficient documentation

## 2018-02-25 DIAGNOSIS — S300XXA Contusion of lower back and pelvis, initial encounter: Secondary | ICD-10-CM | POA: Diagnosis not present

## 2018-02-25 DIAGNOSIS — Z7902 Long term (current) use of antithrombotics/antiplatelets: Secondary | ICD-10-CM | POA: Diagnosis not present

## 2018-02-25 NOTE — Discharge Instructions (Addendum)
Bruising in the right medial buttocks area is likely from bumping it, causing bleeding beneath the skin.  Avoid putting pressure on the area but use heat on it 3 or 4 times a day.  Follow-up with your primary care doctor if your condition worsens or you have other needs.  Return here, if needed, for problems.

## 2018-02-25 NOTE — ED Triage Notes (Signed)
She c/o sore "bump" near left hip. She denies trauma, and is in no distress. She saw a provider at [facility] this morning, who advised her to come here. She is blind and ambulates capably with her cane.

## 2018-02-25 NOTE — ED Notes (Signed)
Pt has large, hard, swollen mass to right buttock.  Reports onset last night.  Also has bruising to upper, inner right buttock.  Pt reports no falls.

## 2018-02-25 NOTE — ED Provider Notes (Signed)
[facility] DEPT Provider Note   CSN: [id number] Arrival date & time: 02/25/18  1205     History   Chief Complaint Chief Complaint  Patient presents with  . Leg Swelling    HPI ZEYNEP FANTROY is a 72 y.o. female.  HPI   She presents for evaluation of soreness in her right buttocks region, present for several days without known trauma.  She thought she had a problem with her "piriformis muscle."  She went to her orthopedist who suggested that she come here for evaluation.  No known recent trauma.  No fever, chills, nausea, vomiting, shortness of breath, chest pain, weakness or dizziness.  She takes Eliquis.  There are no other known modifying factors.  Past Medical History:  Diagnosis Date  . Adenomatous colon polyp 2007 & 2010    Dr [name]  . Allergy    seasonal  . Arthritis   . Cataract   . Diverticulosis   . 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 Hyperlipidemia   . Hypertension   . IBS (irritable bowel syndrome)   . MVP (mitral valve prolapse)   . Paroxysmal atrial fibrillation with rapid ventricular response ([place]) 08/29/2017  . Peripheral vascular disease ([place])   . RAD (reactive airway disease)     Patient Active Problem List   Diagnosis Date Noted  . 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 Paroxysmal atrial fibrillation ([facility])   . Herpes zoster without complication 46/96/2952  . Jaw claudication 07/27/2017  . Temporal arteritis ([place]) 07/18/2017  . Vision, loss, sudden, bilateral 07/18/2017  . 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 Parotitis 10/06/2016  . Osteoarthritis 06/25/2016  . Anxiety 06/25/2016  . Back pain 07/18/2015  . 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 Osteopenia 06/03/2008  . History of colonic polyps 06/03/2008  . INTENTION TREMOR 12/06/2007  . IBS 10/16/2007  . Hyperlipidemia 06/06/2007  . Essential hypertension 06/06/2007  . Chronic obstructive airway disease with  asthma ([place]) 02/23/2007    Past Surgical History:  Procedure Laterality Date  . ABDOMINAL HYSTERECTOMY     with USO for dysfunctionall menses  . APPENDECTOMY    . ARTERY BIOPSY Bilateral 07/21/2017   Procedure: BIOPSY BILATERAL TEMPORAL ARTERY;  Surgeon: [name], MD;  Location: [facility];  Service: Vascular;  Laterality: Bilateral;  . CATARACT EXTRACTION W/ INTRAOCULAR LENS IMPLANT  2013   bilateral; Dr [name]  . COLONOSCOPY  2013   negative , due 2018; Dr [name]  . COLONOSCOPY W/ POLYPECTOMY      X2 ; diverticulosis  . EMBOLECTOMY Right 08/29/2017   Procedure: Right Groin Exploration  ;  Surgeon: Rosetta Posner, MD;  Location: [facility];  Service: Vascular;  Laterality: Right;  . LUMBAR LAMINECTOMY  2000   Dr Vertell Limber  . SHOULDER SURGERY     R shoulder  . TONSILLECTOMY       OB History   None      Home Medications    Prior to Admission medications   Medication Sig Start Date End Date Taking? Authorizing Provider  acetaminophen (TYLENOL) 500 MG tablet Take 1,000 mg by mouth every 6 (six) hours as needed for headache.    [provider]  alendronate (FOSAMAX) 70 MG tablet  01/27/18   [provider]  apixaban (ELIQUIS) 5 MG TABS tablet Take 1 tablet (5 mg total) by mouth 2 (two) times daily. 12/21/17   [name], MD  Calcium Carbonate-Vitamin D (CALCIUM  600/VITAMIN D PO) Take 1 tablet by mouth daily.     [provider]  CARTIA XT 120 MG 24 hr capsule Take 120 mg by mouth daily.  11/19/17   [provider]  Cholecalciferol (VITAMIN D3) 1000 UNITS CAPS Take 3,000 Units by mouth daily with lunch.     [provider]  diltiazem (CARDIZEM) 30 MG tablet Take 1 tablet (30 mg total) by mouth once as needed for up to 1 dose. 09/07/17   [name], MD  furosemide (LASIX) 40 MG tablet TAKE ONE TABLET BY MOUTH DAILY 08/08/17   [name], MD  gabapentin (NEURONTIN) 100 MG capsule Take 500 mg by mouth at bedtime.  08/05/17    [provider]  LORazepam (ATIVAN) 1 MG tablet Take 1 tablet (1 mg total) by mouth at bedtime. 02/06/18   [name], MD  montelukast (SINGULAIR) 10 MG tablet Take 1 tablet (10 mg total) by mouth daily. 11/21/17   [name], MD  potassium chloride SA (K-DUR,KLOR-CON) 20 MEQ tablet Take 2 tablets (40 mEq total) by mouth daily. 07/26/17   [name], MD  pravastatin (PRAVACHOL) 40 MG tablet TAKE 1 TABLET DAILY BY MOUTH AT BEDTIME 11/21/17   [name], MD  predniSONE (DELTASONE) 10 MG tablet See Notes to Pharmacy 10/03/17   [provider]  Probiotic Product (ALIGN) 4 MG CAPS Take 4 mg by mouth daily.     [provider]  SYMBICORT 160-4.5 MCG/ACT inhaler INHALE 1 OR 2 PUFFS INTO THE LUNGS EVERY 12 HOURS. THEN GARGLE AND SPIT AFTER USE 09/26/17   [name], MD  theophylline (UNIPHYL) 400 MG 24 hr tablet TAKE 1 TABLET (400 MG TOTAL) BY MOUTH DAILY. Patient taking differently: Take 200 mg by mouth 2 (two) times daily.  07/04/17   [name], MD  traMADol (ULTRAM) 50 MG tablet Take 1-2 tablets (50-100 mg total) by mouth every 8 (eight) hours as needed. 07/27/17   [name], MD  trimethoprim (TRIMPEX) 100 MG tablet Take 100 mg by mouth as needed (bladder problems during travel).  09/15/11   [provider]  VENTOLIN HFA 108 (90 Base) MCG/ACT inhaler Inhale 1-2 puffs into the lungs every 6 (six) hours as needed for wheezing or shortness of breath. 10/24/17   [name], MD    Family History Family History  Problem Relation Age of Onset  . Heart attack Brother 62  . Parkinsonism Mother   . Stroke Mother   . Heart attack Father        pre 26  . Diabetes Sister   . COPD Sister        X 3  . Other Son        suicide 2016  . Colon cancer Neg Hx   . Cancer Neg Hx   . Esophageal cancer Neg Hx   . Liver cancer Neg Hx   . Pancreatic cancer Neg Hx   . Rectal cancer Neg Hx   . Stomach cancer Neg Hx     Social History Social History    Tobacco Use  . Smoking status: Former Smoker    Packs/day: 1.00    Years: 23.00    Pack years: 23.00    Last attempt to quit: 06/14/1989    Years since quitting: 28.7  . Smokeless tobacco: Never Used  . Tobacco comment: smoked 1961-1991 , up to < 1 ppd  Substance Use Topics  . Alcohol  use: Yes    Alcohol/week: 14.0 standard drinks    Types: 14 Glasses of wine per week    Comment: Wine  . Drug use: No     Allergies   Simvastatin   Review of Systems Review of Systems  All other systems reviewed and are negative.    Physical Exam Updated Vital Signs BP (!) 187/76   Pulse 82   Temp 98 F (36.7 C) (Oral)   Resp 16   SpO2 98%   Physical Exam  Constitutional: She is oriented to person, place, and time. She appears well-developed and well-nourished.  HENT:  Head: Normocephalic and atraumatic.  Eyes: Pupils are equal, round, and reactive to light. Conjunctivae and EOM are normal.  Neck: Normal range of motion and phonation normal. Neck supple.  Cardiovascular: Normal rate.  Pulmonary/Chest: Effort normal.  Abdominal: There is guarding.  Musculoskeletal: Normal range of motion.  Superficial mass right upper medial buttocks region with overlying ecchymosis, consistent with subcutaneous hematoma.  No large deformity.  No associated crepitation.  Normal active and passive range of motion of both hips knees and ankles.  Neurological: She is alert and oriented to person, place, and time. She exhibits normal muscle tone.  Skin: Skin is warm and dry.  Superficial bruising anterior surfaces of lower arms and lower legs bilaterally.  Psychiatric: She has a normal mood and affect. Her behavior is normal. Judgment and thought content normal.  Nursing note and vitals reviewed.    ED Treatments / Results  Labs (all labs ordered are listed, but only abnormal results are displayed) Labs Reviewed - No data to display  EKG None  Radiology No results  found.  Procedures Procedures (including critical care time)  Medications Ordered in ED Medications - No data to display   Initial Impression / Assessment and Plan / ED Course  I have reviewed the triage vital signs and the nursing notes.  Pertinent labs & imaging results that were available during my care of the patient were reviewed by me and considered in my medical decision making (see chart for details).      Patient Vitals for the past 24 hrs:  BP Temp Temp src Pulse Resp SpO2  02/25/18 1443 (!) 187/76 - - - 16 98 %  02/25/18 1210 (!) 157/84 98 F (36.7 C) Oral 82 17 98 %    3:04 PM Reevaluation with update and discussion. After initial assessment and treatment, an updated evaluation reveals no change in clinical status.  Findings discussed with patient and all questions were answered. Daleen Bo   Medical Decision Making: Evaluation consistent with continued bleeding without suspected deep tissue bleed or significant intravascular volume depletion.  Doubt fracture.  Symptomatic care is indicated.  CRITICAL CARE-no Performed by: Daleen Bo   Nursing Notes Reviewed/ Care Coordinated Applicable Imaging Reviewed Interpretation of Laboratory Data incorporated into ED treatment  The patient appears reasonably screened and/or stabilized for discharge and I doubt any other medical condition or other [facility] requiring further screening, evaluation, or treatment in the ED at this time prior to discharge.  Plan: Home Medications-continue usual medication; Home Treatments-heat to affected area; return here if the recommended treatment, does not improve the symptoms; Recommended follow up-PCP, PRN     Final Clinical Impressions(s) / ED Diagnoses   Final diagnoses:  Traumatic hematoma of buttock, initial encounter    ED Discharge Orders    None       Daleen Bo, MD 02/25/18 1505

## 2018-03-06 DIAGNOSIS — H540X55 Blindness right eye category 5, blindness left eye category 5: Secondary | ICD-10-CM | POA: Diagnosis not present

## 2018-03-16 DIAGNOSIS — H540X55 Blindness right eye category 5, blindness left eye category 5: Secondary | ICD-10-CM | POA: Diagnosis not present

## 2018-03-18 ENCOUNTER — Inpatient Hospital Stay (HOSPITAL_COMMUNITY)
Admission: EM | Admit: 2018-03-18 | Discharge: 2018-03-24 | DRG: 480 | Disposition: A | Discharge status: Rehabilitation Facility | Payer: Medicare Other | Attending: Family Medicine | Admitting: Family Medicine

## 2018-03-18 ENCOUNTER — Other Ambulatory Visit: Payer: Self-pay

## 2018-03-18 ENCOUNTER — Encounter (HOSPITAL_COMMUNITY): Payer: Self-pay | Admitting: *Deleted

## 2018-03-18 ENCOUNTER — Emergency Department (HOSPITAL_COMMUNITY): Payer: Medicare Other

## 2018-03-18 DIAGNOSIS — W19XXXA Unspecified fall, initial encounter: Secondary | ICD-10-CM | POA: Diagnosis not present

## 2018-03-18 DIAGNOSIS — I4819 Other persistent atrial fibrillation: Secondary | ICD-10-CM | POA: Diagnosis not present

## 2018-03-18 DIAGNOSIS — Z7901 Long term (current) use of anticoagulants: Secondary | ICD-10-CM

## 2018-03-18 DIAGNOSIS — S199XXA Unspecified injury of neck, initial encounter: Secondary | ICD-10-CM | POA: Diagnosis not present

## 2018-03-18 DIAGNOSIS — S72001A Fracture of unspecified part of neck of right femur, initial encounter for closed fracture: Secondary | ICD-10-CM | POA: Diagnosis not present

## 2018-03-18 DIAGNOSIS — I959 Hypotension, unspecified: Secondary | ICD-10-CM | POA: Diagnosis present

## 2018-03-18 DIAGNOSIS — Z888 Allergy status to other drugs, medicaments and biological substances status: Secondary | ICD-10-CM | POA: Diagnosis not present

## 2018-03-18 DIAGNOSIS — Z823 Family history of stroke: Secondary | ICD-10-CM

## 2018-03-18 DIAGNOSIS — E039 Hypothyroidism, unspecified: Secondary | ICD-10-CM | POA: Diagnosis present

## 2018-03-18 DIAGNOSIS — Z86010 Personal history of colonic polyps: Secondary | ICD-10-CM | POA: Diagnosis not present

## 2018-03-18 DIAGNOSIS — Z9181 History of falling: Secondary | ICD-10-CM

## 2018-03-18 DIAGNOSIS — H548 Legal blindness, as defined in USA: Secondary | ICD-10-CM | POA: Diagnosis not present

## 2018-03-18 DIAGNOSIS — I4891 Unspecified atrial fibrillation: Secondary | ICD-10-CM | POA: Diagnosis not present

## 2018-03-18 DIAGNOSIS — K589 Irritable bowel syndrome without diarrhea: Secondary | ICD-10-CM | POA: Diagnosis present

## 2018-03-18 DIAGNOSIS — S72009A Fracture of unspecified part of neck of unspecified femur, initial encounter for closed fracture: Secondary | ICD-10-CM | POA: Diagnosis not present

## 2018-03-18 DIAGNOSIS — Z833 Family history of diabetes mellitus: Secondary | ICD-10-CM

## 2018-03-18 DIAGNOSIS — E875 Hyperkalemia: Secondary | ICD-10-CM | POA: Diagnosis present

## 2018-03-18 DIAGNOSIS — I471 Supraventricular tachycardia: Secondary | ICD-10-CM | POA: Diagnosis not present

## 2018-03-18 DIAGNOSIS — M316 Other giant cell arteritis: Secondary | ICD-10-CM | POA: Diagnosis present

## 2018-03-18 DIAGNOSIS — I5041 Acute combined systolic (congestive) and diastolic (congestive) heart failure: Secondary | ICD-10-CM | POA: Diagnosis not present

## 2018-03-18 DIAGNOSIS — Z8249 Family history of ischemic heart disease and other diseases of the circulatory system: Secondary | ICD-10-CM

## 2018-03-18 DIAGNOSIS — I110 Hypertensive heart disease with heart failure: Secondary | ICD-10-CM | POA: Diagnosis present

## 2018-03-18 DIAGNOSIS — J449 Chronic obstructive pulmonary disease, unspecified: Secondary | ICD-10-CM | POA: Diagnosis present

## 2018-03-18 DIAGNOSIS — S72001D Fracture of unspecified part of neck of right femur, subsequent encounter for closed fracture with routine healing: Secondary | ICD-10-CM | POA: Diagnosis not present

## 2018-03-18 DIAGNOSIS — Z825 Family history of asthma and other chronic lower respiratory diseases: Secondary | ICD-10-CM

## 2018-03-18 DIAGNOSIS — I482 Chronic atrial fibrillation, unspecified: Secondary | ICD-10-CM | POA: Diagnosis not present

## 2018-03-18 DIAGNOSIS — Z419 Encounter for procedure for purposes other than remedying health state, unspecified: Secondary | ICD-10-CM

## 2018-03-18 DIAGNOSIS — R51 Headache: Secondary | ICD-10-CM | POA: Diagnosis not present

## 2018-03-18 DIAGNOSIS — S72001S Fracture of unspecified part of neck of right femur, sequela: Secondary | ICD-10-CM | POA: Diagnosis not present

## 2018-03-18 DIAGNOSIS — E785 Hyperlipidemia, unspecified: Secondary | ICD-10-CM | POA: Diagnosis not present

## 2018-03-18 DIAGNOSIS — Z961 Presence of intraocular lens: Secondary | ICD-10-CM | POA: Diagnosis present

## 2018-03-18 DIAGNOSIS — I4892 Unspecified atrial flutter: Secondary | ICD-10-CM | POA: Diagnosis present

## 2018-03-18 DIAGNOSIS — I5032 Chronic diastolic (congestive) heart failure: Secondary | ICD-10-CM | POA: Diagnosis not present

## 2018-03-18 DIAGNOSIS — Z820 Family history of epilepsy and other diseases of the nervous system: Secondary | ICD-10-CM | POA: Diagnosis not present

## 2018-03-18 DIAGNOSIS — S72091A Other fracture of head and neck of right femur, initial encounter for closed fracture: Secondary | ICD-10-CM | POA: Diagnosis not present

## 2018-03-18 DIAGNOSIS — Z79899 Other long term (current) drug therapy: Secondary | ICD-10-CM

## 2018-03-18 DIAGNOSIS — M25551 Pain in right hip: Secondary | ICD-10-CM | POA: Diagnosis not present

## 2018-03-18 DIAGNOSIS — Z9842 Cataract extraction status, left eye: Secondary | ICD-10-CM | POA: Diagnosis not present

## 2018-03-18 DIAGNOSIS — E876 Hypokalemia: Secondary | ICD-10-CM | POA: Diagnosis present

## 2018-03-18 DIAGNOSIS — Z7951 Long term (current) use of inhaled steroids: Secondary | ICD-10-CM

## 2018-03-18 DIAGNOSIS — I5033 Acute on chronic diastolic (congestive) heart failure: Secondary | ICD-10-CM | POA: Diagnosis not present

## 2018-03-18 DIAGNOSIS — Z86718 Personal history of other venous thrombosis and embolism: Secondary | ICD-10-CM

## 2018-03-18 DIAGNOSIS — Z9841 Cataract extraction status, right eye: Secondary | ICD-10-CM | POA: Diagnosis not present

## 2018-03-18 DIAGNOSIS — S72009S Fracture of unspecified part of neck of unspecified femur, sequela: Secondary | ICD-10-CM | POA: Diagnosis not present

## 2018-03-18 DIAGNOSIS — S72011A Unspecified intracapsular fracture of right femur, initial encounter for closed fracture: Secondary | ICD-10-CM | POA: Diagnosis not present

## 2018-03-18 DIAGNOSIS — M1990 Unspecified osteoarthritis, unspecified site: Secondary | ICD-10-CM | POA: Diagnosis present

## 2018-03-18 DIAGNOSIS — I480 Paroxysmal atrial fibrillation: Secondary | ICD-10-CM | POA: Diagnosis present

## 2018-03-18 DIAGNOSIS — S0990XA Unspecified injury of head, initial encounter: Secondary | ICD-10-CM | POA: Diagnosis not present

## 2018-03-18 DIAGNOSIS — M25559 Pain in unspecified hip: Secondary | ICD-10-CM | POA: Diagnosis present

## 2018-03-18 DIAGNOSIS — Y92009 Unspecified place in unspecified non-institutional (private) residence as the place of occurrence of the external cause: Secondary | ICD-10-CM | POA: Diagnosis not present

## 2018-03-18 DIAGNOSIS — W109XXA Fall (on) (from) unspecified stairs and steps, initial encounter: Secondary | ICD-10-CM | POA: Diagnosis present

## 2018-03-18 DIAGNOSIS — Z90710 Acquired absence of both cervix and uterus: Secondary | ICD-10-CM | POA: Diagnosis not present

## 2018-03-18 DIAGNOSIS — R0902 Hypoxemia: Secondary | ICD-10-CM

## 2018-03-18 DIAGNOSIS — J45909 Unspecified asthma, uncomplicated: Secondary | ICD-10-CM | POA: Diagnosis not present

## 2018-03-18 DIAGNOSIS — I739 Peripheral vascular disease, unspecified: Secondary | ICD-10-CM | POA: Diagnosis present

## 2018-03-18 DIAGNOSIS — Z87891 Personal history of nicotine dependence: Secondary | ICD-10-CM | POA: Diagnosis not present

## 2018-03-18 DIAGNOSIS — R0789 Other chest pain: Secondary | ICD-10-CM | POA: Diagnosis not present

## 2018-03-18 DIAGNOSIS — Z9889 Other specified postprocedural states: Secondary | ICD-10-CM

## 2018-03-18 DIAGNOSIS — S8991XA Unspecified injury of right lower leg, initial encounter: Secondary | ICD-10-CM | POA: Diagnosis not present

## 2018-03-18 DIAGNOSIS — Z79891 Long term (current) use of opiate analgesic: Secondary | ICD-10-CM

## 2018-03-18 DIAGNOSIS — R000 Tachycardia, unspecified: Secondary | ICD-10-CM | POA: Diagnosis not present

## 2018-03-18 DIAGNOSIS — M25561 Pain in right knee: Secondary | ICD-10-CM | POA: Diagnosis not present

## 2018-03-18 DIAGNOSIS — Z7952 Long term (current) use of systemic steroids: Secondary | ICD-10-CM | POA: Diagnosis not present

## 2018-03-18 DIAGNOSIS — S299XXA Unspecified injury of thorax, initial encounter: Secondary | ICD-10-CM | POA: Diagnosis not present

## 2018-03-18 DIAGNOSIS — I10 Essential (primary) hypertension: Secondary | ICD-10-CM | POA: Diagnosis not present

## 2018-03-18 HISTORY — DX: Legal blindness, as defined in USA: H54.8

## 2018-03-18 LAB — CBC WITH DIFFERENTIAL/PLATELET
Abs Immature Granulocytes: 0.2 10*3/uL — ABNORMAL HIGH (ref 0.0–0.1)
Basophils Absolute: 0 10*3/uL (ref 0.0–0.1)
Basophils Relative: 0 %
Eosinophils Absolute: 0 10*3/uL (ref 0.0–0.7)
Eosinophils Relative: 0 %
HCT: 43.1 % (ref 36.0–46.0)
Hemoglobin: 14.3 g/dL (ref 12.0–15.0)
Immature Granulocytes: 1 %
Lymphocytes Relative: 17 %
Lymphs Abs: 2.1 10*3/uL (ref 0.7–4.0)
MCH: 35.8 pg — ABNORMAL HIGH (ref 26.0–34.0)
MCHC: 33.2 g/dL (ref 30.0–36.0)
MCV: 107.8 fL — ABNORMAL HIGH (ref 78.0–100.0)
Monocytes Absolute: 1.2 10*3/uL — ABNORMAL HIGH (ref 0.1–1.0)
Monocytes Relative: 10 %
Neutro Abs: 8.8 10*3/uL — ABNORMAL HIGH (ref 1.7–7.7)
Neutrophils Relative %: 72 %
Platelets: 289 10*3/uL (ref 150–400)
RBC: 4 MIL/uL (ref 3.87–5.11)
RDW: 15.9 % — ABNORMAL HIGH (ref 11.5–15.5)
WBC: 12.3 10*3/uL — ABNORMAL HIGH (ref 4.0–10.5)

## 2018-03-18 LAB — COMPREHENSIVE METABOLIC PANEL
ALT: 24 U/L (ref 0–44)
AST: 20 U/L (ref 15–41)
Albumin: 4 g/dL (ref 3.5–5.0)
Alkaline Phosphatase: 53 U/L (ref 38–126)
Anion gap: 14 (ref 5–15)
BUN: 14 mg/dL (ref 8–23)
CO2: 29 mmol/L (ref 22–32)
Calcium: 9.8 mg/dL (ref 8.9–10.3)
Chloride: 94 mmol/L — ABNORMAL LOW (ref 98–111)
Creatinine, Ser: 0.74 mg/dL (ref 0.44–1.00)
GFR calc Af Amer: >60 mL/min (ref >60)
GFR calc non Af Amer: >60 mL/min (ref >60)
Glucose, Bld: 81 mg/dL (ref 70–99)
Potassium: 3.8 mmol/L (ref 3.5–5.1)
Sodium: 137 mmol/L (ref 135–145)
Total Bilirubin: 1 mg/dL (ref 0.3–1.2)
Total Protein: 6.4 g/dL — ABNORMAL LOW (ref 6.5–8.1)

## 2018-03-18 LAB — MAGNESIUM
Magnesium: 1.9 mg/dL (ref 1.7–2.4)
Magnesium: 1.7 mg/dL (ref 1.7–2.4)

## 2018-03-18 LAB — ABO/RH: ABO/RH(D): O POS

## 2018-03-18 LAB — PROTIME-INR
INR: 0.91
Prothrombin Time: 12.2 seconds (ref 11.4–15.2)

## 2018-03-18 LAB — BRAIN NATRIURETIC PEPTIDE: B Natriuretic Peptide: 534 pg/mL — ABNORMAL HIGH (ref 0.0–100.0)

## 2018-03-18 MED ORDER — GABAPENTIN 400 MG PO CAPS
500.0000 mg | ORAL_CAPSULE | Freq: Every day | ORAL | Status: DC
  Administered 2018-03-18 – 2018-03-23 (×6): 500 mg via ORAL
  Filled 2018-03-18 (×6): qty 1

## 2018-03-18 MED ORDER — THEOPHYLLINE ER 400 MG PO TB24
200.0000 mg | ORAL_TABLET | Freq: Two times a day (BID) | ORAL | Status: DC
  Administered 2018-03-18 – 2018-03-21 (×7): 200 mg via ORAL
  Filled 2018-03-18 (×7): qty 0.5

## 2018-03-18 MED ORDER — METOPROLOL TARTRATE 5 MG/5ML IV SOLN
5.0000 mg | INTRAVENOUS | Status: AC | PRN
  Administered 2018-03-18 (×3): 5 mg via INTRAVENOUS
  Filled 2018-03-18 (×3): qty 5

## 2018-03-18 MED ORDER — OXYCODONE-ACETAMINOPHEN 5-325 MG PO TABS
2.0000 | ORAL_TABLET | ORAL | Status: DC | PRN
  Administered 2018-03-18 – 2018-03-20 (×11): 2 via ORAL
  Filled 2018-03-18 (×11): qty 2

## 2018-03-18 MED ORDER — MOMETASONE FURO-FORMOTEROL FUM 200-5 MCG/ACT IN AERO
2.0000 | INHALATION_SPRAY | Freq: Two times a day (BID) | RESPIRATORY_TRACT | Status: DC
  Administered 2018-03-18 – 2018-03-24 (×10): 2 via RESPIRATORY_TRACT
  Filled 2018-03-18: qty 8.8

## 2018-03-18 MED ORDER — CALCIUM CARBONATE-VITAMIN D3 600-400 MG-UNIT PO TABS: 600.0000 mg | ORAL_TABLET | Freq: Every day | ORAL | Status: DC

## 2018-03-18 MED ORDER — ONDANSETRON HCL 4 MG/2ML IJ SOLN: 4.0000 mg | Freq: Four times a day (QID) | INTRAMUSCULAR | Status: DC | PRN

## 2018-03-18 MED ORDER — OXYCODONE-ACETAMINOPHEN 5-325 MG PO TABS
2.0000 | ORAL_TABLET | Freq: Once | ORAL | Status: AC
  Administered 2018-03-18: 2 via ORAL
  Filled 2018-03-18: qty 2

## 2018-03-18 MED ORDER — ALBUTEROL SULFATE HFA 108 (90 BASE) MCG/ACT IN AERS
1.0000 | INHALATION_SPRAY | Freq: Four times a day (QID) | RESPIRATORY_TRACT | Status: DC | PRN
  Administered 2018-03-18: 2 via RESPIRATORY_TRACT
  Filled 2018-03-18: qty 6.7

## 2018-03-18 MED ORDER — FENTANYL CITRATE (PF) 100 MCG/2ML IJ SOLN
50.0000 ug | INTRAMUSCULAR | Status: DC | PRN
  Administered 2018-03-18 (×3): 50 ug via INTRAVENOUS
  Filled 2018-03-18 (×3): qty 2

## 2018-03-18 MED ORDER — AMIODARONE HCL IN DEXTROSE 360-4.14 MG/200ML-% IV SOLN
60.0000 mg/h | INTRAVENOUS | Status: AC
  Administered 2018-03-18 (×2): 60 mg/h via INTRAVENOUS
  Filled 2018-03-18 (×2): qty 200

## 2018-03-18 MED ORDER — CALCIUM CARBONATE-VITAMIN D 500-200 MG-UNIT PO TABS
1.0000 | ORAL_TABLET | Freq: Every day | ORAL | Status: DC
  Administered 2018-03-19 – 2018-03-24 (×6): 1 via ORAL
  Filled 2018-03-18 (×7): qty 1

## 2018-03-18 MED ORDER — PREDNISONE 5 MG PO TABS
7.5000 mg | ORAL_TABLET | Freq: Every day | ORAL | Status: DC
  Administered 2018-03-19 – 2018-03-22 (×4): 7.5 mg via ORAL
  Filled 2018-03-18 (×4): qty 2

## 2018-03-18 MED ORDER — ONDANSETRON HCL 4 MG/2ML IJ SOLN
4.0000 mg | Freq: Once | INTRAMUSCULAR | Status: AC
  Administered 2018-03-18: 4 mg via INTRAVENOUS
  Filled 2018-03-18: qty 2

## 2018-03-18 MED ORDER — TRAMADOL HCL 50 MG PO TABS: 50.0000 mg | ORAL_TABLET | Freq: Two times a day (BID) | ORAL | Status: DC

## 2018-03-18 MED ORDER — LORAZEPAM 1 MG PO TABS
1.0000 mg | ORAL_TABLET | Freq: Every day | ORAL | Status: DC
  Administered 2018-03-18 – 2018-03-23 (×6): 1 mg via ORAL
  Filled 2018-03-18 (×6): qty 1

## 2018-03-18 MED ORDER — HEPARIN (PORCINE) IN NACL 100-0.45 UNIT/ML-% IJ SOLN
700.0000 [IU]/h | INTRAMUSCULAR | Status: DC
  Administered 2018-03-18 – 2018-03-20 (×2): 700 [IU]/h via INTRAVENOUS
  Filled 2018-03-18 (×2): qty 250

## 2018-03-18 MED ORDER — ACETAMINOPHEN 325 MG PO TABS
650.0000 mg | ORAL_TABLET | ORAL | Status: DC | PRN
  Administered 2018-03-19: 650 mg via ORAL
  Filled 2018-03-18: qty 2

## 2018-03-18 MED ORDER — DIGOXIN 0.25 MG/ML IJ SOLN
0.2500 mg | Freq: Once | INTRAMUSCULAR | Status: AC
  Administered 2018-03-19: 0.25 mg via INTRAVENOUS
  Filled 2018-03-18: qty 1

## 2018-03-18 MED ORDER — SACCHAROMYCES BOULARDII 250 MG PO CAPS
250.0000 mg | ORAL_CAPSULE | Freq: Every day | ORAL | Status: DC
  Administered 2018-03-19 – 2018-03-24 (×5): 250 mg via ORAL
  Filled 2018-03-18 (×6): qty 1

## 2018-03-18 MED ORDER — AMIODARONE LOAD VIA INFUSION
150.0000 mg | Freq: Once | INTRAVENOUS | Status: AC
  Administered 2018-03-18: 150 mg via INTRAVENOUS
  Filled 2018-03-18: qty 83.34

## 2018-03-18 MED ORDER — VITAMIN D3 25 MCG (1000 UNIT) PO TABS
3000.0000 [IU] | ORAL_TABLET | Freq: Every day | ORAL | Status: DC
  Administered 2018-03-19 – 2018-03-24 (×5): 3000 [IU] via ORAL
  Filled 2018-03-18 (×11): qty 3

## 2018-03-18 MED ORDER — FENTANYL CITRATE (PF) 100 MCG/2ML IJ SOLN
100.0000 ug | Freq: Once | INTRAMUSCULAR | Status: AC
  Administered 2018-03-18: 100 ug via INTRAVENOUS
  Filled 2018-03-18: qty 2

## 2018-03-18 MED ORDER — TRIMETHOPRIM 100 MG PO TABS: 100.0000 mg | ORAL_TABLET | ORAL | Status: DC | PRN

## 2018-03-18 MED ORDER — POTASSIUM CHLORIDE CRYS ER 20 MEQ PO TBCR
40.0000 meq | EXTENDED_RELEASE_TABLET | Freq: Every day | ORAL | Status: DC
  Administered 2018-03-18 – 2018-03-19 (×2): 40 meq via ORAL
  Filled 2018-03-18 (×2): qty 2

## 2018-03-18 MED ORDER — MONTELUKAST SODIUM 10 MG PO TABS
10.0000 mg | ORAL_TABLET | Freq: Every day | ORAL | Status: DC
  Administered 2018-03-18 – 2018-03-23 (×6): 10 mg via ORAL
  Filled 2018-03-18 (×7): qty 1

## 2018-03-18 MED ORDER — FUROSEMIDE 40 MG PO TABS
40.0000 mg | ORAL_TABLET | Freq: Every day | ORAL | Status: DC
  Administered 2018-03-18 – 2018-03-19 (×2): 40 mg via ORAL
  Filled 2018-03-18: qty 2
  Filled 2018-03-18: qty 1

## 2018-03-18 MED ORDER — AMIODARONE HCL IN DEXTROSE 360-4.14 MG/200ML-% IV SOLN
60.0000 mg/h | INTRAVENOUS | Status: DC
  Administered 2018-03-18 – 2018-03-19 (×4): 30 mg/h via INTRAVENOUS
  Administered 2018-03-20 (×2): 60 mg/h via INTRAVENOUS
  Administered 2018-03-20: 30 mg/h via INTRAVENOUS
  Administered 2018-03-21 (×4): 60 mg/h via INTRAVENOUS
  Administered 2018-03-22: 36 mg/h via INTRAVENOUS
  Filled 2018-03-18 (×11): qty 200

## 2018-03-18 NOTE — Consult Note (Addendum)
Cardiology Consult    Patient ID: MLISS WEDIN; [id number]; 31-Oct-1945   Admit date: 03/18/2018 Date of Consult: 03/18/2018  Primary Care Provider: [name], MD Primary Cardiologist: [name], MD   Patient Profile    Crystal [name] is a 72 y.o. female with past medical history of PAF (on Eliquis), giant cell arteritis (now legally blind), HTN, HLD, and PVD (known right SFA occlusion) who is being seen today for the evaluation of atrial fibrillation with RVR at the request of Margarita Mail, PA-C.   History of Present Illness    Crystal [name] was last examined by Dr. Stanford Breed in 12/2017 and denied any recent chest pain or palpitations at that time. Was continued on Eliquis for anticoagulation and Cardizem CD for rate-control.  She presented to [facility] ED earlier this morning after having a mechanical fall due to missing a step and landing on her right side. Reports hitting her head but did not lose consciousness. She denies any recent chest pain, dyspnea on exertion, palpitations, orthopnea, or PND. Reports good compliance with Eliquis dosing.    Initial labs showed WBC 12.3, Hgb 14.3, platelets 289, Na+ 137, K+ 3.8, and creatinine 0.74. Mg 1.9. BNP 534. CT Head with no acute intracranial abnormalities. CXR with no acute cardiopulmonary abnormalities. Was found to have an impacted subcapital fracture right hip. EKG showed atrial fibrillation with RVR, heart rate 167.  Lopressor was administered for rate control along with Fentanyl for pain management but she became hypotensive with this. Heart rate has remained elevated in the 150's to 160's. She denies any palpitations at this time but does report significant pain along her right hip.   Past Medical History:  Diagnosis Date  . Adenomatous colon polyp 2007 & 2010    Dr [name]  . Allergy    seasonal  . Arthritis   . Cataract   . Diverticulosis   . 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 Hyperlipidemia   .  Hypertension   . IBS (irritable bowel syndrome)   . MVP (mitral valve prolapse)   . Paroxysmal atrial fibrillation with rapid ventricular response ([place]) 08/29/2017  . Peripheral vascular disease ([place])   . RAD (reactive airway disease)     Past Surgical History:  Procedure Laterality Date  . ABDOMINAL HYSTERECTOMY     with USO for dysfunctionall menses  . APPENDECTOMY    . ARTERY BIOPSY Bilateral 07/21/2017   Procedure: BIOPSY BILATERAL TEMPORAL ARTERY;  Surgeon: [name], MD;  Location: [place];  Service: Vascular;  Laterality: Bilateral;  . CATARACT EXTRACTION W/ INTRAOCULAR LENS IMPLANT  2013   bilateral; Dr [name]  . COLONOSCOPY  2013   negative , due 2018; Dr [name]  . COLONOSCOPY W/ POLYPECTOMY      X2 ; diverticulosis  . EMBOLECTOMY Right 08/29/2017   Procedure: Right Groin Exploration  ;  Surgeon: Rosetta Posner, MD;  Location: [place];  Service: Vascular;  Laterality: Right;  . LUMBAR LAMINECTOMY  2000   Dr Vertell Limber  . SHOULDER SURGERY     R shoulder  . TONSILLECTOMY       Home Medications:  Prior to Admission medications   Medication Sig Start Date End Date Taking? Authorizing Provider  acetaminophen (TYLENOL) 500 MG tablet Take 1,000 mg by mouth every 6 (six) hours as needed for headache.   Yes [provider]  alendronate (FOSAMAX) 70 MG tablet Take 70 mg by mouth once a week. On Friday 01/27/18  Yes [provider]  apixaban (ELIQUIS) 5 MG TABS tablet Take 1 tablet (5 mg total) by mouth 2 (two) times daily. 12/21/17  Yes [name], MD  Calcium Carbonate-Vitamin D (CALCIUM 600/VITAMIN D PO) Take 1 tablet by mouth daily.    Yes [provider]  CARTIA XT 120 MG 24 hr capsule Take 120 mg by mouth daily.  11/19/17  Yes [provider]  Cholecalciferol (VITAMIN D3) 1000 UNITS CAPS Take 3,000 Units by mouth daily with lunch.    Yes [provider]  diltiazem (CARDIZEM) 30 MG tablet Take 1 tablet (30 mg total) by mouth once  as needed for up to 1 dose. Patient taking differently: Take 30 mg by mouth once as needed (for afib).  09/07/17  Yes [name], MD  furosemide (LASIX) 40 MG tablet TAKE ONE TABLET BY MOUTH DAILY Patient taking differently: Take 40 mg by mouth daily.  08/08/17  Yes Burns, Claudina Lick, MD  gabapentin (NEURONTIN) 100 MG capsule Take 500 mg by mouth at bedtime.  08/05/17  Yes [provider]  LORazepam (ATIVAN) 1 MG tablet Take 1 tablet (1 mg total) by mouth at bedtime. 02/06/18  Yes Burns, Claudina Lick, MD  montelukast (SINGULAIR) 10 MG tablet Take 1 tablet (10 mg total) by mouth daily. 11/21/17  Yes Burns, Claudina Lick, MD  potassium chloride SA (K-DUR,KLOR-CON) 20 MEQ tablet Take 2 tablets (40 mEq total) by mouth daily. 07/26/17  Yes Burns, Claudina Lick, MD  pravastatin (PRAVACHOL) 40 MG tablet TAKE 1 TABLET DAILY BY MOUTH AT BEDTIME Patient taking differently: Take 40 mg by mouth at bedtime.  11/21/17  Yes Burns, Claudina Lick, MD  SYMBICORT 160-4.5 MCG/ACT inhaler INHALE 1 OR 2 PUFFS INTO THE LUNGS EVERY 12 HOURS. THEN GARGLE AND SPIT AFTER USE Patient taking differently: Inhale 2 puffs into the lungs 2 (two) times daily.  09/26/17  Yes Burns, Claudina Lick, MD  theophylline (UNIPHYL) 400 MG 24 hr tablet TAKE 1 TABLET (400 MG TOTAL) BY MOUTH DAILY. Patient taking differently: Take 200 mg by mouth 2 (two) times daily.  07/04/17  Yes Burns, Claudina Lick, MD  traMADol (ULTRAM) 50 MG tablet Take 1-2 tablets (50-100 mg total) by mouth every 8 (eight) hours as needed. 07/27/17  Yes Burns, Claudina Lick, MD  trimethoprim (TRIMPEX) 100 MG tablet Take 100 mg by mouth as needed (bladder problems during travel).  09/15/11  Yes [provider]  VENTOLIN HFA 108 (90 Base) MCG/ACT inhaler Inhale 1-2 puffs into the lungs every 6 (six) hours as needed for wheezing or shortness of breath. 10/24/17  Yes [name], MD    Inpatient Medications: Scheduled Meds:  Continuous Infusions: . amiodarone 60 mg/hr (03/18/18 1036)   Followed by   . amiodarone     PRN Meds: fentaNYL (SUBLIMAZE) injection, metoprolol tartrate  Allergies:    Allergies  Allergen Reactions  . Simvastatin     myalgias    Social History:   Social History   Socioeconomic History  . Marital status: Married    Spouse name: Not on file  . Number of children: Not on file  . Years of education: Not on file  . Highest education level: Not on file  Occupational History  . Occupation: retired  Scientific laboratory technician  . Financial resource strain: Not on file  . Food insecurity:    Worry: Not on file    Inability: Not on file  . Transportation needs:    Medical: Not on  file    Non-medical: Not on file  Tobacco Use  . Smoking status: Former Smoker    Packs/day: 1.00    Years: 23.00    Pack years: 23.00    Last attempt to quit: 06/14/1989    Years since quitting: 28.7  . Smokeless tobacco: Never Used  . Tobacco comment: smoked 1961-1991 , up to < 1 ppd  Substance and Sexual Activity  . Alcohol use: Yes    Alcohol/week: 14.0 standard drinks    Types: 14 Glasses of wine per week    Comment: Wine  . Drug use: No  . Sexual activity: Not on file  Lifestyle  . Physical activity:    Days per week: Not on file    Minutes per session: Not on file  . Stress: Not on file  Relationships  . Social connections:    Talks on phone: Not on file    Gets together: Not on file    Attends religious service: Not on file    Active member of club or organization: Not on file    Attends meetings of clubs or organizations: Not on file    Relationship status: Not on file  . Intimate partner violence:    Fear of current or ex partner: Not on file    Emotionally abused: Not on file    Physically abused: Not on file    Forced sexual activity: Not on file  Other Topics Concern  . Not on file  Social History Narrative  . Not on file     Family History:    Family History  Problem Relation Age of Onset  . Heart attack Brother 60  . Parkinsonism Mother   . Stroke  Mother   . Heart attack Father        pre 67  . Diabetes Sister   . COPD Sister        X 3  . Other Son        suicide 2016  . Colon cancer Neg Hx   . Cancer Neg Hx   . Esophageal cancer Neg Hx   . Liver cancer Neg Hx   . Pancreatic cancer Neg Hx   . Rectal cancer Neg Hx   . Stomach cancer Neg Hx       Review of Systems    General:  No chills, fever, night sweats or weight changes. Positive for right hip pain.  Cardiovascular:  No chest pain, dyspnea on exertion, edema, orthopnea, palpitations, paroxysmal nocturnal dyspnea. Dermatological: No rash, lesions/masses Respiratory: No cough, dyspnea Urologic: No hematuria, dysuria Abdominal:   No nausea, vomiting, diarrhea, bright red blood per rectum, melena, or hematemesis Neurologic:  No visual changes, wkns, changes in mental status. All other systems reviewed and are otherwise negative except as noted above.  Physical Exam/Data    Vitals:   03/18/18 0945 03/18/18 1015 03/18/18 1026 03/18/18 1030  BP: 91/71 (!) 82/66 (!) 129/92 96/63  Pulse: (!) 160 (!) 161 (!) 156 85  Resp: (!) 28 (!) 26 18 16   Temp:      SpO2: 95% 98% 97% 97%  Weight:      Height:       No intake or output data in the 24 hours ending 03/18/18 1038 Filed Weights   03/18/18 0627  Weight: 50.8 kg   Body mass index is 19.84 kg/m.   General: Pleasant, Caucasian female, appearing uncomfortable. Psych: Normal affect. Neuro: Alert and oriented X 3. Moves all extremities spontaneously.  HEENT: Normal  Neck: Supple without bruits or JVD. Lungs:  Resp regular and unlabored, CTA without wheezing or rales. Heart: Irregularly irregular, no s3, s4, or murmurs. Abdomen: Soft, non-tender, non-distended, BS + x 4.  Extremities: No clubbing, cyanosis or lower extremity edema. DP/PT/Radials 2+ and equal bilaterally.   Labs/Studies     Relevant CV Studies:  Echocardiogram: 08/2017 Study Conclusions  - Left ventricle: The cavity size was normal. Wall  thickness was   normal. Systolic function was normal. The estimated ejection   fraction was in the range of 60% to 65%. Wall motion was normal;   there were no regional wall motion abnormalities. Features are   consistent with a pseudonormal left ventricular filling pattern,   with concomitant abnormal relaxation and increased filling   pressure (grade 2 diastolic dysfunction). - Left atrium: The atrium was moderately dilated. - Tricuspid valve: There was mild-moderate regurgitation. - Pulmonary arteries: Systolic pressure was moderately increased.   PA peak pressure: 47 mm Hg (S).  Laboratory Data:  Chemistry Recent Labs  Lab 03/18/18 0652  NA 137  K 3.8  CL 94*  CO2 29  GLUCOSE 81  BUN 14  CREATININE 0.74  CALCIUM 9.8  GFRNONAA >60  GFRAA >60  ANIONGAP 14    Recent Labs  Lab 03/18/18 0652  PROT 6.4*  ALBUMIN 4.0  AST 20  ALT 24  ALKPHOS 53  BILITOT 1.0   Hematology Recent Labs  Lab 03/18/18 0652  WBC 12.3*  RBC 4.00  HGB 14.3  HCT 43.1  MCV 107.8*  MCH 35.8*  MCHC 33.2  RDW 15.9*  PLT 289   Cardiac EnzymesNo results for input(s): TROPONINI in the last 168 hours. No results for input(s): TROPIPOC in the last 168 hours.  BNP Recent Labs  Lab 03/18/18 0652  BNP 534.0*    DDimer No results for input(s): DDIMER in the last 168 hours.  Radiology/Studies:  Ct Head Wo Contrast  Result Date: 03/18/2018 CLINICAL DATA:  Fall, headache EXAM: CT HEAD WITHOUT CONTRAST CT CERVICAL SPINE WITHOUT CONTRAST TECHNIQUE: Multidetector CT imaging of the head and cervical spine was performed following the standard protocol without intravenous contrast. Multiplanar CT image reconstructions of the cervical spine were also generated. COMPARISON:  11/27/2007 FINDINGS: CT HEAD FINDINGS Brain: Mild age related volume loss. No acute intracranial abnormality. Specifically, no hemorrhage, hydrocephalus, mass lesion, acute infarction, or significant intracranial injury. Vascular: No  hyperdense vessel or unexpected calcification. Skull: No acute calvarial abnormality. Sinuses/Orbits: Visualized paranasal sinuses and mastoids clear. Orbital soft tissues unremarkable. Other: None CT CERVICAL SPINE FINDINGS Alignment: 3 mm of anterolisthesis of C4 on C5 related to facet disease. Skull base and vertebrae: No acute fracture. No primary bone lesion or focal pathologic process. Soft tissues and spinal canal: No prevertebral fluid or swelling. No visible canal hematoma. Disc levels: Degenerative disc and facet disease diffusely throughout the cervical spine. Disc disease is most pronounced at C5-6 and C6-7. Facet disease is most pronounced at C3-4 and C4-5. Upper chest: No acute findings Other: No acute findings IMPRESSION: No acute intracranial abnormality. No acute bony abnormality in the cervical spine. Degenerative disc and facet disease. Electronically Signed   By: Rolm Baptise M.D.   On: 03/18/2018 08:53   Ct Cervical Spine Wo Contrast  Result Date: 03/18/2018 CLINICAL DATA:  Fall, headache EXAM: CT HEAD WITHOUT CONTRAST CT CERVICAL SPINE WITHOUT CONTRAST TECHNIQUE: Multidetector CT imaging of the head and cervical spine was performed following the standard protocol without intravenous contrast.  Multiplanar CT image reconstructions of the cervical spine were also generated. COMPARISON:  11/27/2007 FINDINGS: CT HEAD FINDINGS Brain: Mild age related volume loss. No acute intracranial abnormality. Specifically, no hemorrhage, hydrocephalus, mass lesion, acute infarction, or significant intracranial injury. Vascular: No hyperdense vessel or unexpected calcification. Skull: No acute calvarial abnormality. Sinuses/Orbits: Visualized paranasal sinuses and mastoids clear. Orbital soft tissues unremarkable. Other: None CT CERVICAL SPINE FINDINGS Alignment: 3 mm of anterolisthesis of C4 on C5 related to facet disease. Skull base and vertebrae: No acute fracture. No primary bone lesion or focal  pathologic process. Soft tissues and spinal canal: No prevertebral fluid or swelling. No visible canal hematoma. Disc levels: Degenerative disc and facet disease diffusely throughout the cervical spine. Disc disease is most pronounced at C5-6 and C6-7. Facet disease is most pronounced at C3-4 and C4-5. Upper chest: No acute findings Other: No acute findings IMPRESSION: No acute intracranial abnormality. No acute bony abnormality in the cervical spine. Degenerative disc and facet disease. Electronically Signed   By: Rolm Baptise M.D.   On: 03/18/2018 08:53   Dg Chest Port 1 View  Result Date: 03/18/2018 CLINICAL DATA:  Pt missed a step at home and fell onto her right side this AM; pt complains of lateral right hip pain that radiates down right leg; hx of HTN, MVP, and A-fib; former smoker EXAM: PORTABLE CHEST - 1 VIEW COMPARISON:  09/07/2017 FINDINGS: Lungs are clear. Heart size and mediastinal contours are within normal limits. Aortic Atherosclerosis (ICD10-170.0). No effusion.  No pneumothorax. Visualized bones unremarkable. IMPRESSION: No acute cardiopulmonary disease. Electronically Signed   By: Lucrezia Europe M.D.   On: 03/18/2018 08:58   Dg Knee Right Port  Result Date: 03/18/2018 CLINICAL DATA:  Pt missed a step at home and fell onto her right side this AM; pt complains of lateral right hip pain that radiates down right leg EXAM: PORTABLE RIGHT KNEE - 1-2 VIEW COMPARISON:  None. FINDINGS: No evidence of fracture, dislocation, or joint effusion. Mild diffuse osteopenia. No evidence of arthropathy or other focal bone abnormality. Soft tissues are unremarkable. IMPRESSION: Negative. Electronically Signed   By: Lucrezia Europe M.D.   On: 03/18/2018 08:59   Dg Hip Unilat With Pelvis 2-3 Views Right  Result Date: 03/18/2018 CLINICAL DATA:  Pt missed a step at home and fell onto her right side this AM; pt complains of lateral right hip pain that radiates down right leg EXAM: DG HIP (WITH OR WITHOUT PELVIS) 2-3V  RIGHT COMPARISON:  None. FINDINGS: Mildly impacted, minimally comminuted mid cervical fracture of the proximal right femur. No significant angulation deformity. No dislocation. Pelvis intact. Mild spondylitic changes in the lower lumbar spine. IMPRESSION: 1. Impacted mid cervical fracture, proximal right femur. Electronically Signed   By: Lucrezia Europe M.D.   On: 03/18/2018 09:01     Assessment & Plan    1. Atrial Fibrillation with RVR - The patient has a known history of paroxysmal atrial fibrillation and presented with a hip fracture following a mechanical fall, found to be in atrial fibrillation with RVR.  - Lopressor has been administered with no improvement in her HR and she is now hypotensive with SBP in the 80's to 90's following administration of this along with pain medication. Reviewed with Dr. Gwenlyn Found and will plan to start IV Amiodarone for rate-control and hopeful conversion back to NSR. Hold PTA Cardizem given hypotension.  - She does report compliance with Eliquis prior to admission but did not take a dose this morning.  Eliquis is now held given the need for upcoming surgery. If she does not convert back to NSR within 24 hours, would need to consider bridging with Heparin in the peri-operative setting.  2. PVD - known right SFA occlusion. Followed by Vascular Surgery. Has not been on ASA given the need for anticoagulation.   3. Preoperative Cardiac Clearance - imaging on admission showed an impacted subcapital fracture of the right hip. She denies any recent anginal symptoms recent echocardiogram earlier this year showed a preserved EF with no regional wall motion abnormalities. She would not require further ischemic evaluation prior to surgery but we will try to optimize HR control prior to her going to the OR.   For questions or updates, please contact [place] Please consult [email or web address] for contact info under Cardiology/STEMI.  Signed, Erma Heritage, PA-C 03/18/2018,  10:38 AM Pager: [phone]   Agree with note by Guinevere Scarlet  Ms. Daddona is a patient of Dr. Jacalyn Lefevre.  She has giant cell arteritis and is blind.  She has had PAF in the past and is on Eliquis oral anticoagulation.  She had a femoral embolus status post embolectomy followed by Dr. Donnetta Hutching.  She apparently tripped and fell and broke her hip.  She is currently in a lot of pain.  She is in A. fib with RVR with blood pressures of [id number] systolic.  She has been on Eliquis without interruption.  Her cardiac risk for surgery is low.  Agree with stopping Eliquis in anticipation of surgery.  We can rate control her and/or try to convert her with intravenous amiodarone.  Her exam is benign.  We will follow with you.   Lorretta Harp, M.D., [place], [facility], [place] [address]. [place], [place]  [id number]  [phone] 03/18/2018 1:26 PM

## 2018-03-18 NOTE — H&P (Signed)
History and Physical  Crystal [name] RXV:[id number] DOB: 06-Feb-1946 DOA: 03/18/2018  Referring physician: ED physician Dr. Margarita Mail PCP: [name], MD  Outpatient Specialists: None Patient coming from: Home & is able to ambulate yes  Chief Complaint: Recent fall  HPI: Crystal [name] is a 72 y.o. 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  Recent blindness history of hyperlipidemia hypertension irritable bowel syndrome and osteoarthritis as well as adenomatous colon polyp, who presents today with a fall at her home.  She is found to have a valgus impacted right femoral neck fracture by CT scan. Patient stated that she has not become accustomed to not seeing and getting around so she has been stumbling and had a couple of falls.  Which is what happened today she fell she hit her head but did not lose consciousness she was able to get up and ambulate after falling but could not lift up her leg or stand she called EMS.  And when they arrived EKG showed her heart rate was 170 with what appears to be SVT.Marland Kitchen  She takes Cardizem for the atrial fibrillation as well as Eliquis but she had not taken it the morning of this ED visit.   ED Course: ED physician consulted orthopedic as well as cardiology.  She was given fentanyl but her blood pressure dropped so pain medicine was withheld for quite a while finally she was given Percocet with good control without effect on her blood pressure she was also given Zofran.  She was given metoprolol for the SVT and a flutter with minimal improvement rather her blood pressure dropped and she was also found to be slightly hypoxic with oxygen  saturation 92% on 2 L/min BNP was slightly elevated to 500 but due to her low blood pressure ED did not give any diuretic.  She was started on amiodarone drip after a bolus in consultation with cardiology Orthopedic was  consulted who ordered CT scan of the right hip which showed valgus impacted fracture of the right femoral neck  Review of Systems: Muscular skeletal:   Pt denies any chest pain, fever chills or abdominal pain .  Review of systems are otherwise negative   Past Medical History:  Diagnosis Date  . Adenomatous colon polyp 2007 & 2010    Dr [name]  . Allergy    seasonal  . Arthritis   . Cataract   . Diverticulosis   . 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 Hyperlipidemia   . Hypertension   . IBS (irritable bowel syndrome)   . MVP (mitral valve prolapse)   . Paroxysmal atrial fibrillation with rapid ventricular response ([place]) 08/29/2017  . Peripheral vascular disease ([place])   . RAD (reactive airway disease)    Past Surgical History:  Procedure Laterality Date  . ABDOMINAL HYSTERECTOMY     with USO for dysfunctionall menses  . APPENDECTOMY    . ARTERY BIOPSY Bilateral 07/21/2017   Procedure: BIOPSY BILATERAL TEMPORAL ARTERY;  Surgeon: [name], MD;  Location: [facility];  Service: Vascular;  Laterality: Bilateral;  . CATARACT EXTRACTION W/ INTRAOCULAR LENS IMPLANT  2013   bilateral; Dr [name]  . COLONOSCOPY  2013   negative , due 2018; Dr [name]  . COLONOSCOPY W/ POLYPECTOMY      X2 ; diverticulosis  . EMBOLECTOMY Right 08/29/2017   Procedure: Right Groin Exploration  ;  Surgeon: Curt Jews  F, MD;  Location: [facility];  Service: Vascular;  Laterality: Right;  . LUMBAR LAMINECTOMY  2000   Dr Vertell Limber  . SHOULDER SURGERY     R shoulder  . TONSILLECTOMY      Social History:  reports that she quit smoking about 28 years ago. She has a 23.00 pack-year smoking history. She has never used smokeless tobacco. She reports that she drinks about 14.0 standard drinks of alcohol per week. She reports that she does not use drugs.   Allergies  Allergen Reactions  . Simvastatin     myalgias    Family History  Problem Relation Age of Onset  . Heart attack Brother 78  .  Parkinsonism Mother   . Stroke Mother   . Heart attack Father        pre 27  . Diabetes Sister   . COPD Sister        X 3  . Other Son        suicide 2016  . Colon cancer Neg Hx   . Cancer Neg Hx   . Esophageal cancer Neg Hx   . Liver cancer Neg Hx   . Pancreatic cancer Neg Hx   . Rectal cancer Neg Hx   . Stomach cancer Neg Hx       Prior to Admission medications   Medication Sig Start Date End Date Taking? Authorizing Provider  acetaminophen (TYLENOL) 500 MG tablet Take 1,000 mg by mouth every 6 (six) hours as needed for headache.   Yes [provider]  alendronate (FOSAMAX) 70 MG tablet Take 70 mg by mouth once a week. On Friday 01/27/18  Yes [provider]  apixaban (ELIQUIS) 5 MG TABS tablet Take 1 tablet (5 mg total) by mouth 2 (two) times daily. 12/21/17  Yes [name], MD  Calcium Carbonate-Vitamin D (CALCIUM 600/VITAMIN D PO) Take 1 tablet by mouth daily.    Yes [provider]  CARTIA XT 120 MG 24 hr capsule Take 120 mg by mouth daily.  11/19/17  Yes [provider]  Cholecalciferol (VITAMIN D3) 1000 UNITS CAPS Take 3,000 Units by mouth daily with lunch.    Yes [provider]  diltiazem (CARDIZEM) 30 MG tablet Take 1 tablet (30 mg total) by mouth once as needed for up to 1 dose. Patient taking differently: Take 30 mg by mouth once as needed (for afib).  09/07/17  Yes [name], MD  furosemide (LASIX) 40 MG tablet TAKE ONE TABLET BY MOUTH DAILY Patient taking differently: Take 40 mg by mouth daily.  08/08/17  Yes Burns, Claudina Lick, MD  gabapentin (NEURONTIN) 100 MG capsule Take 500 mg by mouth at bedtime.  08/05/17  Yes [provider]  LORazepam (ATIVAN) 1 MG tablet Take 1 tablet (1 mg total) by mouth at bedtime. 02/06/18  Yes Burns, Claudina Lick, MD  montelukast (SINGULAIR) 10 MG tablet Take 1 tablet (10 mg total) by mouth daily. 11/21/17  Yes Burns, Claudina Lick, MD  potassium chloride SA (K-DUR,KLOR-CON) 20 MEQ tablet  Take 2 tablets (40 mEq total) by mouth daily. 07/26/17  Yes Burns, Claudina Lick, MD  pravastatin (PRAVACHOL) 40 MG tablet TAKE 1 TABLET DAILY BY MOUTH AT BEDTIME Patient taking differently: Take 40 mg by mouth at bedtime.  11/21/17  Yes Burns, Claudina Lick, MD  SYMBICORT 160-4.5 MCG/ACT inhaler INHALE 1 OR 2 PUFFS INTO THE LUNGS EVERY 12 HOURS. THEN GARGLE AND SPIT AFTER USE Patient taking differently: Inhale 2 puffs  into the lungs 2 (two) times daily.  09/26/17  Yes Burns, Claudina Lick, MD  theophylline (UNIPHYL) 400 MG 24 hr tablet TAKE 1 TABLET (400 MG TOTAL) BY MOUTH DAILY. Patient taking differently: Take 200 mg by mouth 2 (two) times daily.  07/04/17  Yes Burns, Claudina Lick, MD  traMADol (ULTRAM) 50 MG tablet Take 1-2 tablets (50-100 mg total) by mouth every 8 (eight) hours as needed. 07/27/17  Yes Burns, Claudina Lick, MD  trimethoprim (TRIMPEX) 100 MG tablet Take 100 mg by mouth as needed (bladder problems during travel).  09/15/11  Yes [provider]  VENTOLIN HFA 108 (90 Base) MCG/ACT inhaler Inhale 1-2 puffs into the lungs every 6 (six) hours as needed for wheezing or shortness of breath. 10/24/17  Yes Burns, Claudina Lick, MD    Physical Exam: BP (!) 117/91   Pulse (!) 106   Temp 98.9 F (37.2 C)   Resp (!) 21   Ht 5\' 3"  (1.6 m)   Wt 50.8 kg   SpO2 96%   BMI 19.84 kg/m   General: Frail looking female alert and oriented x3 well-developed well-nourished not in distress at this time Eyes: PERRLA EOMI ,patient is legally HEENT: Normocephalic, positive trauma with bruising over the right temple.  Eyes are clear conjunctiva is clear no icterus Neck: Supple she had a cervical collar when she first arrived but that has been removed Cardiovascular: Bilateral pitting edema dorsalis pedis pulse are positive, bilateral pitting edema Respiratory: Unlabored clear to auscultation bilaterally Abdomen: Soft nontender normoactive bowel sounds Skin: Clear no diaphoresis no rashes Musculoskeletal: Lateral pitting edema,  tender left hip Psychiatric: Appropriate pleasant Neurologic: Alert oriented x3 speech appropriate          Labs on Admission:  Basic Metabolic Panel: Recent Labs  Lab 03/18/18 0652  NA 137  K 3.8  CL 94*  CO2 29  GLUCOSE 81  BUN 14  CREATININE 0.74  CALCIUM 9.8  MG 1.9   Liver Function Tests: Recent Labs  Lab 03/18/18 0652  AST 20  ALT 24  ALKPHOS 53  BILITOT 1.0  PROT 6.4*  ALBUMIN 4.0   No results for input(s): LIPASE, AMYLASE in the last 168 hours. No results for input(s): AMMONIA in the last 168 hours. CBC: Recent Labs  Lab 03/18/18 0652  WBC 12.3*  NEUTROABS 8.8*  HGB 14.3  HCT 43.1  MCV 107.8*  PLT 289   Cardiac Enzymes: No results for input(s): CKTOTAL, CKMB, CKMBINDEX, TROPONINI in the last 168 hours.  BNP (last 3 results) Recent Labs    03/18/18 0652  BNP 534.0*    ProBNP (last 3 results) No results for input(s): PROBNP in the last 8760 hours.  CBG: No results for input(s): GLUCAP in the last 168 hours.  Radiological Exams on Admission: Ct Head Wo Contrast  Result Date: 03/18/2018 CLINICAL DATA:  Fall, headache EXAM: CT HEAD WITHOUT CONTRAST CT CERVICAL SPINE WITHOUT CONTRAST TECHNIQUE: Multidetector CT imaging of the head and cervical spine was performed following the standard protocol without intravenous contrast. Multiplanar CT image reconstructions of the cervical spine were also generated. COMPARISON:  11/27/2007 FINDINGS: CT HEAD FINDINGS Brain: Mild age related volume loss. No acute intracranial abnormality. Specifically, no hemorrhage, hydrocephalus, mass lesion, acute infarction, or significant intracranial injury. Vascular: No hyperdense vessel or unexpected calcification. Skull: No acute calvarial abnormality. Sinuses/Orbits: Visualized paranasal sinuses and mastoids clear. Orbital soft tissues unremarkable. Other: None CT CERVICAL SPINE FINDINGS Alignment: 3 mm of anterolisthesis of C4 on C5  related to facet disease. Skull base and  vertebrae: No acute fracture. No primary bone lesion or focal pathologic process. Soft tissues and spinal canal: No prevertebral fluid or swelling. No visible canal hematoma. Disc levels: Degenerative disc and facet disease diffusely throughout the cervical spine. Disc disease is most pronounced at C5-6 and C6-7. Facet disease is most pronounced at C3-4 and C4-5. Upper chest: No acute findings Other: No acute findings IMPRESSION: No acute intracranial abnormality. No acute bony abnormality in the cervical spine. Degenerative disc and facet disease. Electronically Signed   By: Rolm Baptise M.D.   On: 03/18/2018 08:53   Ct Cervical Spine Wo Contrast  Result Date: 03/18/2018 CLINICAL DATA:  Fall, headache EXAM: CT HEAD WITHOUT CONTRAST CT CERVICAL SPINE WITHOUT CONTRAST TECHNIQUE: Multidetector CT imaging of the head and cervical spine was performed following the standard protocol without intravenous contrast. Multiplanar CT image reconstructions of the cervical spine were also generated. COMPARISON:  11/27/2007 FINDINGS: CT HEAD FINDINGS Brain: Mild age related volume loss. No acute intracranial abnormality. Specifically, no hemorrhage, hydrocephalus, mass lesion, acute infarction, or significant intracranial injury. Vascular: No hyperdense vessel or unexpected calcification. Skull: No acute calvarial abnormality. Sinuses/Orbits: Visualized paranasal sinuses and mastoids clear. Orbital soft tissues unremarkable. Other: None CT CERVICAL SPINE FINDINGS Alignment: 3 mm of anterolisthesis of C4 on C5 related to facet disease. Skull base and vertebrae: No acute fracture. No primary bone lesion or focal pathologic process. Soft tissues and spinal canal: No prevertebral fluid or swelling. No visible canal hematoma. Disc levels: Degenerative disc and facet disease diffusely throughout the cervical spine. Disc disease is most pronounced at C5-6 and C6-7. Facet disease is most pronounced at C3-4 and C4-5. Upper chest: No  acute findings Other: No acute findings IMPRESSION: No acute intracranial abnormality. No acute bony abnormality in the cervical spine. Degenerative disc and facet disease. Electronically Signed   By: Rolm Baptise M.D.   On: 03/18/2018 08:53   Ct Hip Right Wo Contrast  Result Date: 03/18/2018 CLINICAL DATA:  Right hip pain due to a fall on steps today. Initial encounter. EXAM: CT OF THE RIGHT HIP WITHOUT CONTRAST TECHNIQUE: Multidetector CT imaging of the right hip was performed according to the standard protocol. Multiplanar CT image reconstructions were also generated. COMPARISON:  Plain films right hip this same day. FINDINGS: Bones/Joint/Cartilage Mildly impacted subcapital right hip fracture is identified as seen on the comparison plain films. No other fracture is seen. 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 Degenerative change about the symphysis pubis also noted. Ligaments Suboptimally assessed by CT. Muscles and Tendons Appear intact. Soft tissues Imaged intrapelvic contents demonstrate no acute abnormality. Atherosclerosis and diverticulosis noted. IMPRESSION: Mildly impacted subcapital fracture right hip. No other acute finding. Electronically Signed   By: Inge Rise M.D.   On: 03/18/2018 12:34   Dg Chest Port 1 View  Result Date: 03/18/2018 CLINICAL DATA:  Pt missed a step at home and fell onto her right side this AM; pt complains of lateral right hip pain that radiates down right leg; hx of HTN, MVP, and A-fib; former smoker EXAM: PORTABLE CHEST - 1 VIEW COMPARISON:  09/07/2017 FINDINGS: Lungs are clear. Heart size and mediastinal contours are within normal limits. Aortic Atherosclerosis (ICD10-170.0). No effusion.  No pneumothorax. Visualized bones unremarkable. IMPRESSION: No acute cardiopulmonary disease. Electronically Signed   By: Lucrezia Europe M.D.   On: 03/18/2018 08:58   Dg Knee Right Port  Result Date: 03/18/2018 CLINICAL DATA:  Pt missed a  step at home and fell onto  her right side this AM; pt complains of lateral right hip pain that radiates down right leg EXAM: PORTABLE RIGHT KNEE - 1-2 VIEW COMPARISON:  None. FINDINGS: No evidence of fracture, dislocation, or joint effusion. Mild diffuse osteopenia. No evidence of arthropathy or other focal bone abnormality. Soft tissues are unremarkable. IMPRESSION: Negative. Electronically Signed   By: Lucrezia Europe M.D.   On: 03/18/2018 08:59   Dg Hip Unilat With Pelvis 2-3 Views Right  Result Date: 03/18/2018 CLINICAL DATA:  Pt missed a step at home and fell onto her right side this AM; pt complains of lateral right hip pain that radiates down right leg EXAM: DG HIP (WITH OR WITHOUT PELVIS) 2-3V RIGHT COMPARISON:  None. FINDINGS: Mildly impacted, minimally comminuted mid cervical fracture of the proximal right femur. No significant angulation deformity. No dislocation. Pelvis intact. Mild spondylitic changes in the lower lumbar spine. IMPRESSION: 1. Impacted mid cervical fracture, proximal right femur. Electronically Signed   By: Lucrezia Europe M.D.   On: 03/18/2018 09:01    EKG:  Per ED physician  Assessment/Plan Present on Admission: . Atrial fibrillation ([place])  72 year old female who presented with a fall with hip fracture noted but she had tachycardia with heart rate in the 170s and appeared to be in SVT  when she presented she was diagnosed with A. fib with rapid ventricular rate. 1.  Recent fall .Orthopedic was consulted who ordered CT scan of the right hip which showed valgus impacted fracture of the right femoral neck   impacted femoral fracture orthopedic was consulted we will plan on operation when medically stable. 2.  Heart failure.  Her BMP was also noted to be elevated and she had lower extremity edema consistent with CHF.  Due to her low blood pressure she was not given any diuretics.  3.  Atrial fibrillation with rapid ventricular rate.  Patient is on Eliquis and Cardizem.  Eliquis will be on hold for possible  surgery.  She was started on heparin  Active Problems:   Atrial fibrillation (HCC)   DVT prophylaxis: Heparin  Code Status: Full  Family Communication: Husband and son at bedside  Disposition Plan: To be determined  Consults called: Cardiology and orthopedic surgery Dr. Lyla Glassing  Admission status: Patient with stepdown    Cristal Deer MD [facility] Pager [phone]  If 7PM-7AM, please contact night-coverage [email or web address] Password [email or web address]  03/18/2018, 2:17 PM

## 2018-03-18 NOTE — Progress Notes (Addendum)
ANTICOAGULATION CONSULT NOTE - Initial Consult  Pharmacy Consult:  Heparin  Indication: atrial fibrillation  Allergies  Allergen Reactions  . Simvastatin     myalgias    Patient Measurements: Height: 5\' 3"  (160 cm) Weight: 112 lb (50.8 kg) IBW/kg (Calculated) : 52.4 Heparin Dosing Weight: 50 kg  Vital Signs: Temp: 98.9 F (37.2 C) (10/05 0631) BP: 117/91 (10/05 1315) Pulse Rate: 106 (10/05 1315)  Labs: Recent Labs    03/18/18 0652  HGB 14.3  HCT 43.1  PLT 289  LABPROT 12.2  INR 0.91  CREATININE 0.74    Estimated Creatinine Clearance: 49.5 mL/min (by C-G formula based on SCr of 0.74 mg/dL).   Medical History: Past Medical History:  Diagnosis Date  . Adenomatous colon polyp 2007 & 2010    Dr [name]  . Allergy    seasonal  . Arthritis   . Cataract   . Diverticulosis   . 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 Hyperlipidemia   . Hypertension   . IBS (irritable bowel syndrome)   . MVP (mitral valve prolapse)   . Paroxysmal atrial fibrillation with rapid ventricular response ([place]) 08/29/2017  . Peripheral vascular disease ([place])   . RAD (reactive airway disease)       Assessment: 30 YOF presented with right hip fracture s/p fall on 03/17/18.  She has a history of AFib on Eliquis PTA, last dose 03/17/18 at 2200.  Pharmacy consulted to transition patient to IV heparin in anticipation of surgery.  CT negative; baseline labs reviewed.  Goal of Therapy:  Heparin level 0.3-0.7 units/ml Monitor platelets by anticoagulation protocol: Yes    Plan:  Heparin gtt at 700 units/hr, no bolus Check 8 hr heparin level and aPTT Daily heparin level, aPTT and CBC   Jahkai Yandell D. Mina Marble, PharmD, BCPS, [place] 03/18/2018, 2:54 PM

## 2018-03-18 NOTE — ED Provider Notes (Signed)
[facility] Provider Note   CSN: [id number] Arrival date & time: 03/18/18  0938     History   Chief Complaint Chief Complaint  Patient presents with  . Fall    HPI Crystal [name] is a 72 y.o. 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 She is anticoagulated on Eliquis for a Afib and previous DVT.Marland Kitchen She states that she is still "getting used to being blind" and had a mechanical fall this morning. She c/o severe R hip pain. She hit her head but did not lose consciousness. She states that she was able to ambulate after the fall, but she could not lift her leg or stand up after the fall. EMS found her heart rate at 170. EKG showed what appeared to be SVT. She denies cp/ sob/ and denies hx of SVT. She takes Cardizem daily but has not taken in this morning.   HPI  Past Medical History:  Diagnosis Date  . Adenomatous colon polyp 2007 & 2010    Dr [name]  . Allergy    seasonal  . Arthritis   . Cataract   . Diverticulosis   . 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 Hyperlipidemia   . Hypertension   . IBS (irritable bowel syndrome)   . MVP (mitral valve prolapse)   . Paroxysmal atrial fibrillation with rapid ventricular response ([place]) 08/29/2017  . Peripheral vascular disease ([place])   . RAD (reactive airway disease)     Patient Active Problem List   Diagnosis Date Noted  . 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 Paroxysmal atrial fibrillation (HCC)   . Herpes zoster without complication 18/29/9371  . Jaw claudication 07/27/2017  . Temporal arteritis ([place]) 07/18/2017  . Vision, loss, sudden, bilateral 07/18/2017  . 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 Parotitis 10/06/2016  . Osteoarthritis 06/25/2016  . Anxiety 06/25/2016  . Back pain 07/18/2015  . 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 Osteopenia 06/03/2008  . History of colonic  polyps 06/03/2008  . INTENTION TREMOR 12/06/2007  . IBS 10/16/2007  . Hyperlipidemia 06/06/2007  . Essential hypertension 06/06/2007  . Chronic obstructive airway disease with asthma ([place]) 02/23/2007    Past Surgical History:  Procedure Laterality Date  . ABDOMINAL HYSTERECTOMY     with USO for dysfunctionall menses  . APPENDECTOMY    . ARTERY BIOPSY Bilateral 07/21/2017   Procedure: BIOPSY BILATERAL TEMPORAL ARTERY;  Surgeon: [name], MD;  Location: [facility];  Service: Vascular;  Laterality: Bilateral;  . CATARACT EXTRACTION W/ INTRAOCULAR LENS IMPLANT  2013   bilateral; Dr [name]  . COLONOSCOPY  2013   negative , due 2018; Dr [name]  . COLONOSCOPY W/ POLYPECTOMY      X2 ; diverticulosis  . EMBOLECTOMY Right 08/29/2017   Procedure: Right Groin Exploration  ;  Surgeon: Rosetta Posner, MD;  Location: [facility];  Service: Vascular;  Laterality: Right;  . LUMBAR LAMINECTOMY  2000   Dr Vertell Limber  . SHOULDER SURGERY     R shoulder  . TONSILLECTOMY       OB History   None      Home Medications    Prior to Admission medications   Medication Sig Start Date End Date Taking? Authorizing Provider  acetaminophen (TYLENOL) 500 MG tablet Take 1,000 mg by mouth every 6 (six) hours as needed for headache.    [provider]  alendronate (FOSAMAX) 70 MG tablet  01/27/18   [provider]  apixaban (ELIQUIS) 5 MG TABS tablet Take 1 tablet (5 mg total) by mouth 2 (two) times daily. 12/21/17   [name], MD  Calcium Carbonate-Vitamin D (CALCIUM 600/VITAMIN D PO) Take 1 tablet by mouth daily.     [provider]  CARTIA XT 120 MG 24 hr capsule Take 120 mg by mouth daily.  11/19/17   [provider]  Cholecalciferol (VITAMIN D3) 1000 UNITS CAPS Take 3,000 Units by mouth daily with lunch.     [provider]  diltiazem (CARDIZEM) 30 MG tablet Take 1 tablet (30 mg total) by mouth once as needed for up to 1 dose. 09/07/17   [name], MD    furosemide (LASIX) 40 MG tablet TAKE ONE TABLET BY MOUTH DAILY 08/08/17   [name], MD  gabapentin (NEURONTIN) 100 MG capsule Take 500 mg by mouth at bedtime.  08/05/17   [provider]  LORazepam (ATIVAN) 1 MG tablet Take 1 tablet (1 mg total) by mouth at bedtime. 02/06/18   [name], MD  montelukast (SINGULAIR) 10 MG tablet Take 1 tablet (10 mg total) by mouth daily. 11/21/17   [name], MD  potassium chloride SA (K-DUR,KLOR-CON) 20 MEQ tablet Take 2 tablets (40 mEq total) by mouth daily. 07/26/17   [name], MD  pravastatin (PRAVACHOL) 40 MG tablet TAKE 1 TABLET DAILY BY MOUTH AT BEDTIME 11/21/17   [name], MD  predniSONE (DELTASONE) 10 MG tablet See Notes to Pharmacy 10/03/17   [provider]  Probiotic Product (ALIGN) 4 MG CAPS Take 4 mg by mouth daily.     [provider]  SYMBICORT 160-4.5 MCG/ACT inhaler INHALE 1 OR 2 PUFFS INTO THE LUNGS EVERY 12 HOURS. THEN GARGLE AND SPIT AFTER USE 09/26/17   [name], MD  theophylline (UNIPHYL) 400 MG 24 hr tablet TAKE 1 TABLET (400 MG TOTAL) BY MOUTH DAILY. Patient taking differently: Take 200 mg by mouth 2 (two) times daily.  07/04/17   [name], MD  traMADol (ULTRAM) 50 MG tablet Take 1-2 tablets (50-100 mg total) by mouth every 8 (eight) hours as needed. 07/27/17   [name], MD  trimethoprim (TRIMPEX) 100 MG tablet Take 100 mg by mouth as needed (bladder problems during travel).  09/15/11   [provider]  VENTOLIN HFA 108 (90 Base) MCG/ACT inhaler Inhale 1-2 puffs into the lungs every 6 (six) hours as needed for wheezing or shortness of breath. 10/24/17   [name], MD    Family History Family History  Problem Relation Age of Onset  . Heart attack Brother 77  . Parkinsonism Mother   . Stroke Mother   . Heart attack Father        pre 57  . Diabetes Sister   . COPD Sister        X 3  . Other Son        suicide 2016  . Colon cancer Neg Hx   . Cancer Neg Hx    . Esophageal cancer Neg Hx   . Liver cancer Neg Hx   . Pancreatic cancer Neg Hx   . Rectal cancer Neg Hx   . Stomach cancer Neg Hx     Social History Social History   Tobacco Use  . Smoking status: Former Smoker    Packs/day: 1.00    Years: 23.00  Pack years: 23.00    Last attempt to quit: 06/14/1989    Years since quitting: 28.7  . Smokeless tobacco: Never Used  . Tobacco comment: smoked 1961-1991 , up to < 1 ppd  Substance Use Topics  . Alcohol use: Yes    Alcohol/week: 14.0 standard drinks    Types: 14 Glasses of wine per week    Comment: Wine  . Drug use: No     Allergies   Simvastatin   Review of Systems Review of Systems Ten systems reviewed and are negative for acute change, except as noted in the HPI.    Physical Exam Updated Vital Signs BP 111/87   Pulse (!) 166   Temp 98.9 F (37.2 C)   Resp 16   Ht 5\' 3"  (1.6 m)   Wt 50.8 kg   SpO2 92%   BMI 19.84 kg/m   Physical Exam  Constitutional: She is oriented to person, place, and time. She appears well-developed and well-nourished. No distress. Cervical collar in place.  HENT:  Head: Normocephalic.  Bruise over the R temple  Eyes: Conjunctivae are normal. No scleral icterus.  Neck:  c-collar in place  Cardiovascular: Normal rate, regular rhythm and normal heart sounds. Exam reveals no gallop and no friction rub.  No murmur heard. Pulses:      Dorsalis pedis pulses are 1+ on the right side, and 2+ on the left side.  Pulmonary/Chest: Effort normal and breath sounds normal. No respiratory distress.  Abdominal: Soft. Bowel sounds are normal. She exhibits no distension and no mass. There is no tenderness. There is no guarding.  Musculoskeletal: She exhibits edema.       Legs: BL pitting edema  Neurological: She is alert and oriented to person, place, and time.  Skin: Skin is warm and dry. She is not diaphoretic.  Psychiatric: Her behavior is normal.  Nursing note and vitals reviewed.    ED  Treatments / Results  Labs (all labs ordered are listed, but only abnormal results are displayed) Labs Reviewed  CBC WITH DIFFERENTIAL/PLATELET  PROTIME-INR  COMPREHENSIVE METABOLIC PANEL  TYPE AND SCREEN    EKG None  Radiology No results found.  Procedures .Critical Care Performed by: Margarita Mail, PA-C Authorized by: Margarita Mail, PA-C   Critical care provider statement:    Critical care time (minutes):  70   Critical care was necessary to treat or prevent imminent or life-threatening deterioration of the following conditions:  Cardiac failure and circulatory failure   Critical care was time spent personally by me on the following activities:  Discussions with consultants, evaluation of patient's response to treatment, examination of patient, ordering and performing treatments and interventions, ordering and review of laboratory studies, ordering and review of radiographic studies, pulse oximetry, re-evaluation of patient's condition, obtaining history from patient or surrogate, review of old charts and development of treatment plan with patient or surrogate   (including critical care time)  Medications Ordered in ED Medications  fentaNYL (SUBLIMAZE) injection 50 mcg (has no administration in time range)  ondansetron (ZOFRAN) injection 4 mg (has no administration in time range)     Initial Impression / Assessment and Plan / ED Course  I have reviewed the triage vital signs and the nursing notes.  Pertinent labs & imaging results that were available during my care of the patient were reviewed by me and considered in my medical decision making (see chart for details).  Clinical Course as of Mar 18 1640  Sat Mar 18, 2018  0746 Patient with elevated HR. SVT vs. Aflutter. Given 1 dose of metoprolol with minimal improvement and a drop in her Blood pressure   [AH]  1031 BP(!): 129/92 [AH]  1100 B Natriuretic Peptide(!): 534.0 [AH]    Clinical Course User Index [AH]  Margarita Mail, PA-C    Complicated 72 year old female patient who presented after fall with hip fracture.  Notably she had tachycardia into the 170s which appeared to be SVT with a likely underlying a flutter.  Patient initially was given metoprolol with a decrease in her heart rate but her blood pressure also became soft.  She did rebound and her tachycardia up to 170 again.  Repeat EKG showed A. fib with RVR.  Patient has had low oxygen saturations hovering about 92% on 2 L.  She has no oxygen dependence or history of hypoxia and I suspect some measure of pulmonary edema secondary to elevated heart rate which is likely been going on for some time.  She has been asymptomatic with her tachycardia.  Her BNP is elevated.  Her chest x-ray shows no pulmonary edema.  I personally reviewed the one view chest x-ray.  Though I do not see evidence of pulmonary edema clinically she appears to have some volume overload.  Given her soft blood pressures I did not feel that diuresis was of urgent concern as her oxygen saturations have been staying 92% and above. I Consulted with cardiology who ordered amiodarone bolus and drip which has improved her heart rate and blood pressure.  Her pain has been difficult to control as I have been unable to order strong pain medication secondary to her unstable vital signs.  I have reviewed her lab results which show elevated white blood cell count.  She does not appear to have any other significant lab abnormalities.  I have also spoke with Dr. Lyla Glassing.  The patient will need medical clearance prior to repair of her hip fracture.  She will be admitted to the hospitalist service.  Final Clinical Impressions(s) / ED Diagnoses   Final diagnoses:  Hip pain  Atrial fibrillation with rapid ventricular response (HCC)  Closed fracture of right hip, initial encounter [facility])  Hypoxia    ED Discharge Orders    None       Margarita Mail, PA-C 03/18/18 [place], Wenda Overland, MD 03/20/18 [phone]

## 2018-03-18 NOTE — Progress Notes (Signed)
Pt transferred from ED, HR at arrival was 140, EKG was Atrial Fib with RVR on monitor. BP 107/66 mmHg. On O2 NCL 2 LPM, SPO2 94 %. On Heparin infusion 700 unit / hour and Amiodarone 30 mg/hr. PRN Metoprolol 5 mg IV given and she responsed well, HR 100-110, BP 98/70 mmHg, asymptomatic. Will continue to monitor.  Kennyth Lose, RN

## 2018-03-18 NOTE — ED Notes (Signed)
Ordered heart healthy diet for pt

## 2018-03-18 NOTE — ED Notes (Signed)
PA aware of blood pressure readings in 30'S systolic, verbal order received for fluids to be given.

## 2018-03-18 NOTE — H&P (View-Only) (Signed)
ORTHOPAEDIC CONSULTATION  REQUESTING PHYSICIAN: Little, Wenda Overland, MD  PCP:  [name], MD  Chief Complaint: Right hip fracture  HPI: Crystal [name] is a 72 y.o. female who complains of right hip pain after a mechanical ground-level fall earlier today.  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  She is still adjusting to this major lifestyle change.  She missed a step, and fell onto her right hip today.  She noted right hip pain and difficulty weightbearing.  In the emergency department, x-rays and a CT scan revealed a valgus impacted right femoral neck fracture; there is no retroversion of the femoral head.  She was found to be in A. fib with RVR.  She takes chronic Eliquis.  Last dose of Eliquis was yesterday.  She is also on 60 mg of prednisone per day.  Orthopedic consultation was placed for management of her hip fracture.  Past Medical History:  Diagnosis Date  . Adenomatous colon polyp 2007 & 2010    Dr [name]  . Allergy    seasonal  . Arthritis   . Cataract   . Diverticulosis   . 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 Hyperlipidemia   . Hypertension   . IBS (irritable bowel syndrome)   . MVP (mitral valve prolapse)   . Paroxysmal atrial fibrillation with rapid ventricular response ([place]) 08/29/2017  . Peripheral vascular disease ([place])   . RAD (reactive airway disease)    Past Surgical History:  Procedure Laterality Date  . ABDOMINAL HYSTERECTOMY     with USO for dysfunctionall menses  . APPENDECTOMY    . ARTERY BIOPSY Bilateral 07/21/2017   Procedure: BIOPSY BILATERAL TEMPORAL ARTERY;  Surgeon: [name], MD;  Location: [facility];  Service: Vascular;  Laterality: Bilateral;  . CATARACT EXTRACTION W/ INTRAOCULAR LENS IMPLANT  2013   bilateral; Dr [name]  . COLONOSCOPY  2013   negative , due 2018; Dr [name]  . COLONOSCOPY W/ POLYPECTOMY      X2 ; diverticulosis    . EMBOLECTOMY Right 08/29/2017   Procedure: Right Groin Exploration  ;  Surgeon: Rosetta Posner, MD;  Location: [facility];  Service: Vascular;  Laterality: Right;  . LUMBAR LAMINECTOMY  2000   Dr Vertell Limber  . SHOULDER SURGERY     R shoulder  . TONSILLECTOMY     Social History   Socioeconomic History  . Marital status: Married    Spouse name: Not on file  . Number of children: Not on file  . Years of education: Not on file  . Highest education level: Not on file  Occupational History  . Occupation: retired  Scientific laboratory technician  . Financial resource strain: Not on file  . Food insecurity:    Worry: Not on file    Inability: Not on file  . Transportation needs:    Medical: Not on file    Non-medical: Not on file  Tobacco Use  . Smoking status: Former Smoker    Packs/day: 1.00    Years: 23.00    Pack years: 23.00    Last attempt to quit: 06/14/1989    Years since quitting: 28.7  . Smokeless tobacco: Never Used  . Tobacco comment: smoked 1961-1991 , up to < 1 ppd  Substance and Sexual Activity  . Alcohol use: Yes    Alcohol/week: 14.0 standard drinks    Types: 14 Glasses of wine  per week    Comment: Wine  . Drug use: No  . Sexual activity: Not on file  Lifestyle  . Physical activity:    Days per week: Not on file    Minutes per session: Not on file  . Stress: Not on file  Relationships  . Social connections:    Talks on phone: Not on file    Gets together: Not on file    Attends religious service: Not on file    Active member of club or organization: Not on file    Attends meetings of clubs or organizations: Not on file    Relationship status: Not on file  Other Topics Concern  . Not on file  Social History Narrative  . Not on file   Family History  Problem Relation Age of Onset  . Heart attack Brother 52  . Parkinsonism Mother   . Stroke Mother   . Heart attack Father        pre 51  . Diabetes Sister   . COPD Sister        X 3  . Other Son        suicide 2016  .  Colon cancer Neg Hx   . Cancer Neg Hx   . Esophageal cancer Neg Hx   . Liver cancer Neg Hx   . Pancreatic cancer Neg Hx   . Rectal cancer Neg Hx   . Stomach cancer Neg Hx    Allergies  Allergen Reactions  . Simvastatin     myalgias   Prior to Admission medications   Medication Sig Start Date End Date Taking? Authorizing Provider  acetaminophen (TYLENOL) 500 MG tablet Take 1,000 mg by mouth every 6 (six) hours as needed for headache.   Yes [provider]  alendronate (FOSAMAX) 70 MG tablet Take 70 mg by mouth once a week. On Friday 01/27/18  Yes [provider]  apixaban (ELIQUIS) 5 MG TABS tablet Take 1 tablet (5 mg total) by mouth 2 (two) times daily. 12/21/17  Yes [name], MD  Calcium Carbonate-Vitamin D (CALCIUM 600/VITAMIN D PO) Take 1 tablet by mouth daily.    Yes [provider]  CARTIA XT 120 MG 24 hr capsule Take 120 mg by mouth daily.  11/19/17  Yes [provider]  Cholecalciferol (VITAMIN D3) 1000 UNITS CAPS Take 3,000 Units by mouth daily with lunch.    Yes [provider]  diltiazem (CARDIZEM) 30 MG tablet Take 1 tablet (30 mg total) by mouth once as needed for up to 1 dose. Patient taking differently: Take 30 mg by mouth once as needed (for afib).  09/07/17  Yes [name], MD  furosemide (LASIX) 40 MG tablet TAKE ONE TABLET BY MOUTH DAILY Patient taking differently: Take 40 mg by mouth daily.  08/08/17  Yes Burns, Claudina Lick, MD  gabapentin (NEURONTIN) 100 MG capsule Take 500 mg by mouth at bedtime.  08/05/17  Yes [provider]  LORazepam (ATIVAN) 1 MG tablet Take 1 tablet (1 mg total) by mouth at bedtime. 02/06/18  Yes Burns, Claudina Lick, MD  montelukast (SINGULAIR) 10 MG tablet Take 1 tablet (10 mg total) by mouth daily. 11/21/17  Yes Burns, Claudina Lick, MD  potassium chloride SA (K-DUR,KLOR-CON) 20 MEQ tablet Take 2 tablets (40 mEq total) by mouth daily. 07/26/17  Yes Burns, Claudina Lick, MD  pravastatin (PRAVACHOL) 40 MG  tablet TAKE 1 TABLET DAILY BY MOUTH AT BEDTIME Patient taking differently: Take 40  mg by mouth at bedtime.  11/21/17  Yes Burns, Claudina Lick, MD  SYMBICORT 160-4.5 MCG/ACT inhaler INHALE 1 OR 2 PUFFS INTO THE LUNGS EVERY 12 HOURS. THEN GARGLE AND SPIT AFTER USE Patient taking differently: Inhale 2 puffs into the lungs 2 (two) times daily.  09/26/17  Yes Burns, Claudina Lick, MD  theophylline (UNIPHYL) 400 MG 24 hr tablet TAKE 1 TABLET (400 MG TOTAL) BY MOUTH DAILY. Patient taking differently: Take 200 mg by mouth 2 (two) times daily.  07/04/17  Yes Burns, Claudina Lick, MD  traMADol (ULTRAM) 50 MG tablet Take 1-2 tablets (50-100 mg total) by mouth every 8 (eight) hours as needed. 07/27/17  Yes Burns, Claudina Lick, MD  trimethoprim (TRIMPEX) 100 MG tablet Take 100 mg by mouth as needed (bladder problems during travel).  09/15/11  Yes [provider]  VENTOLIN HFA 108 (90 Base) MCG/ACT inhaler Inhale 1-2 puffs into the lungs every 6 (six) hours as needed for wheezing or shortness of breath. 10/24/17  Yes [name], MD   Ct Head Wo Contrast  Result Date: 03/18/2018 CLINICAL DATA:  Fall, headache EXAM: CT HEAD WITHOUT CONTRAST CT CERVICAL SPINE WITHOUT CONTRAST TECHNIQUE: Multidetector CT imaging of the head and cervical spine was performed following the standard protocol without intravenous contrast. Multiplanar CT image reconstructions of the cervical spine were also generated. COMPARISON:  11/27/2007 FINDINGS: CT HEAD FINDINGS Brain: Mild age related volume loss. No acute intracranial abnormality. Specifically, no hemorrhage, hydrocephalus, mass lesion, acute infarction, or significant intracranial injury. Vascular: No hyperdense vessel or unexpected calcification. Skull: No acute calvarial abnormality. Sinuses/Orbits: Visualized paranasal sinuses and mastoids clear. Orbital soft tissues unremarkable. Other: None CT CERVICAL SPINE FINDINGS Alignment: 3 mm of anterolisthesis of C4 on C5 related to facet disease. Skull  base and vertebrae: No acute fracture. No primary bone lesion or focal pathologic process. Soft tissues and spinal canal: No prevertebral fluid or swelling. No visible canal hematoma. Disc levels: Degenerative disc and facet disease diffusely throughout the cervical spine. Disc disease is most pronounced at C5-6 and C6-7. Facet disease is most pronounced at C3-4 and C4-5. Upper chest: No acute findings Other: No acute findings IMPRESSION: No acute intracranial abnormality. No acute bony abnormality in the cervical spine. Degenerative disc and facet disease. Electronically Signed   By: Rolm Baptise M.D.   On: 03/18/2018 08:53   Ct Cervical Spine Wo Contrast  Result Date: 03/18/2018 CLINICAL DATA:  Fall, headache EXAM: CT HEAD WITHOUT CONTRAST CT CERVICAL SPINE WITHOUT CONTRAST TECHNIQUE: Multidetector CT imaging of the head and cervical spine was performed following the standard protocol without intravenous contrast. Multiplanar CT image reconstructions of the cervical spine were also generated. COMPARISON:  11/27/2007 FINDINGS: CT HEAD FINDINGS Brain: Mild age related volume loss. No acute intracranial abnormality. Specifically, no hemorrhage, hydrocephalus, mass lesion, acute infarction, or significant intracranial injury. Vascular: No hyperdense vessel or unexpected calcification. Skull: No acute calvarial abnormality. Sinuses/Orbits: Visualized paranasal sinuses and mastoids clear. Orbital soft tissues unremarkable. Other: None CT CERVICAL SPINE FINDINGS Alignment: 3 mm of anterolisthesis of C4 on C5 related to facet disease. Skull base and vertebrae: No acute fracture. No primary bone lesion or focal pathologic process. Soft tissues and spinal canal: No prevertebral fluid or swelling. No visible canal hematoma. Disc levels: Degenerative disc and facet disease diffusely throughout the cervical spine. Disc disease is most pronounced at C5-6 and C6-7. Facet disease is most pronounced at C3-4 and C4-5. Upper  chest: No acute findings Other: No acute findings  IMPRESSION: No acute intracranial abnormality. No acute bony abnormality in the cervical spine. Degenerative disc and facet disease. Electronically Signed   By: Rolm Baptise M.D.   On: 03/18/2018 08:53   Ct Hip Right Wo Contrast  Result Date: 03/18/2018 CLINICAL DATA:  Right hip pain due to a fall on steps today. Initial encounter. EXAM: CT OF THE RIGHT HIP WITHOUT CONTRAST TECHNIQUE: Multidetector CT imaging of the right hip was performed according to the standard protocol. Multiplanar CT image reconstructions were also generated. COMPARISON:  Plain films right hip this same day. FINDINGS: Bones/Joint/Cartilage Mildly impacted subcapital right hip fracture is identified as seen on the comparison plain films. No other fracture is seen. 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 Degenerative change about the symphysis pubis also noted. Ligaments Suboptimally assessed by CT. Muscles and Tendons Appear intact. Soft tissues Imaged intrapelvic contents demonstrate no acute abnormality. Atherosclerosis and diverticulosis noted. IMPRESSION: Mildly impacted subcapital fracture right hip. No other acute finding. Electronically Signed   By: Inge Rise M.D.   On: 03/18/2018 12:34   Dg Chest Port 1 View  Result Date: 03/18/2018 CLINICAL DATA:  Pt missed a step at home and fell onto her right side this AM; pt complains of lateral right hip pain that radiates down right leg; hx of HTN, MVP, and A-fib; former smoker EXAM: PORTABLE CHEST - 1 VIEW COMPARISON:  09/07/2017 FINDINGS: Lungs are clear. Heart size and mediastinal contours are within normal limits. Aortic Atherosclerosis (ICD10-170.0). No effusion.  No pneumothorax. Visualized bones unremarkable. IMPRESSION: No acute cardiopulmonary disease. Electronically Signed   By: Lucrezia Europe M.D.   On: 03/18/2018 08:58   Dg Knee Right Port  Result Date: 03/18/2018 CLINICAL DATA:  Pt missed a step at home and  fell onto her right side this AM; pt complains of lateral right hip pain that radiates down right leg EXAM: PORTABLE RIGHT KNEE - 1-2 VIEW COMPARISON:  None. FINDINGS: No evidence of fracture, dislocation, or joint effusion. Mild diffuse osteopenia. No evidence of arthropathy or other focal bone abnormality. Soft tissues are unremarkable. IMPRESSION: Negative. Electronically Signed   By: Lucrezia Europe M.D.   On: 03/18/2018 08:59   Dg Hip Unilat With Pelvis 2-3 Views Right  Result Date: 03/18/2018 CLINICAL DATA:  Pt missed a step at home and fell onto her right side this AM; pt complains of lateral right hip pain that radiates down right leg EXAM: DG HIP (WITH OR WITHOUT PELVIS) 2-3V RIGHT COMPARISON:  None. FINDINGS: Mildly impacted, minimally comminuted mid cervical fracture of the proximal right femur. No significant angulation deformity. No dislocation. Pelvis intact. Mild spondylitic changes in the lower lumbar spine. IMPRESSION: 1. Impacted mid cervical fracture, proximal right femur. Electronically Signed   By: Lucrezia Europe M.D.   On: 03/18/2018 09:01    Positive ROS: All other systems have been reviewed and were otherwise negative with the exception of those mentioned in the HPI and as above.  Physical Exam: General: Alert, no acute distress Cardiovascular: No pedal edema Respiratory: No cyanosis, no use of accessory musculature GI: No organomegaly, abdomen is soft and non-tender Skin: No lesions in the area of chief complaint Neurologic: Sensation intact distally Psychiatric: Patient is competent for consent with normal mood and affect Lymphatic: No axillary or cervical lymphadenopathy  MUSCULOSKELETAL: Examination of the right lower extremity reveals no skin wounds or lesions.  She is able to straight leg raise, however she does have significant groin pain.  There is no shortening  or rotational deformity.  She has pain with range of motion of the hip.  She is neurovascularly intact  distally.  Assessment: Multiple medical problems. Valgus impacted right femoral neck fracture. Chronic Eliquis usage. Temporal arteritis with prednisone usage.  Plan: I discussed the findings with the patient and her family.  This is a very challenging situation.  After thoughtful consideration of the patient's x-rays, CT scan, and clinical picture, I recommend cannulated pinning of the right hip, which is a much smaller operation than an arthroplasty.  We will tentatively plan for surgery on Monday.  She will need cardiology clearance and treatment of her A. fib with RVR.  EDP plans for hospitalist admission for medical stabilization.  We did discuss the risk, benefits, and alternatives.  If the fracture does not heal, then she will require conversion to an arthroplasty procedure in the future.  N.p.o. after midnight on Sunday night.  Hold chemical DVT prophylaxis.  Bedrest for now.  The risks, benefits, and alternatives were discussed with the patient. There are risks associated with the surgery including, but not limited to, problems with anesthesia (death), infection, differences in leg length/angulation/rotation, fracture of bones, loosening or failure of implants, malunion, nonunion, hematoma (blood accumulation) which may require surgical drainage, blood clots, pulmonary embolism, nerve injury (foot drop), and blood vessel injury. The patient understands these risks and elects to proceed.   Bertram Savin, MD Cell [phone]    03/18/2018 1:47 PM

## 2018-03-18 NOTE — Consult Note (Signed)
ORTHOPAEDIC CONSULTATION  REQUESTING PHYSICIAN: Little, Wenda Overland, MD  PCP:  [name], MD  Chief Complaint: Right hip fracture  HPI: Crystal [name] is a 73 y.o. female who complains of right hip pain after a mechanical ground-level fall earlier today.  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  She is still adjusting to this major lifestyle change.  She missed a step, and fell onto her right hip today.  She noted right hip pain and difficulty weightbearing.  In the emergency department, x-rays and a CT scan revealed a valgus impacted right femoral neck fracture; there is no retroversion of the femoral head.  She was found to be in A. fib with RVR.  She takes chronic Eliquis.  Last dose of Eliquis was yesterday.  She is also on 60 mg of prednisone per day.  Orthopedic consultation was placed for management of her hip fracture.  Past Medical History:  Diagnosis Date  . Adenomatous colon polyp 2007 & 2010    Dr [name]  . Allergy    seasonal  . Arthritis   . Cataract   . Diverticulosis   . 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 Hyperlipidemia   . Hypertension   . IBS (irritable bowel syndrome)   . MVP (mitral valve prolapse)   . Paroxysmal atrial fibrillation with rapid ventricular response ([place]) 08/29/2017  . Peripheral vascular disease ([place])   . RAD (reactive airway disease)    Past Surgical History:  Procedure Laterality Date  . ABDOMINAL HYSTERECTOMY     with USO for dysfunctionall menses  . APPENDECTOMY    . ARTERY BIOPSY Bilateral 07/21/2017   Procedure: BIOPSY BILATERAL TEMPORAL ARTERY;  Surgeon: [name], MD;  Location: [facility];  Service: Vascular;  Laterality: Bilateral;  . CATARACT EXTRACTION W/ INTRAOCULAR LENS IMPLANT  2013   bilateral; Dr [name]  . COLONOSCOPY  2013   negative , due 2018; Dr [name]  . COLONOSCOPY W/ POLYPECTOMY      X2 ; diverticulosis    . EMBOLECTOMY Right 08/29/2017   Procedure: Right Groin Exploration  ;  Surgeon: Rosetta Posner, MD;  Location: [facility];  Service: Vascular;  Laterality: Right;  . LUMBAR LAMINECTOMY  2000   Dr [place] Limber  . SHOULDER SURGERY     R shoulder  . TONSILLECTOMY     Social History   Socioeconomic History  . Marital status: Married    Spouse name: Not on file  . Number of children: Not on file  . Years of education: Not on file  . Highest education level: Not on file  Occupational History  . Occupation: retired  Scientific laboratory technician  . Financial resource strain: Not on file  . Food insecurity:    Worry: Not on file    Inability: Not on file  . Transportation needs:    Medical: Not on file    Non-medical: Not on file  Tobacco Use  . Smoking status: Former Smoker    Packs/day: 1.00    Years: 23.00    Pack years: 23.00    Last attempt to quit: 06/14/1989    Years since quitting: 28.7  . Smokeless tobacco: Never Used  . Tobacco comment: smoked 1961-1991 , up to < 1 ppd  Substance and Sexual Activity  . Alcohol use: Yes    Alcohol/week: 14.0 standard drinks    Types: 14 Glasses of wine  per week    Comment: Wine  . Drug use: No  . Sexual activity: Not on file  Lifestyle  . Physical activity:    Days per week: Not on file    Minutes per session: Not on file  . Stress: Not on file  Relationships  . Social connections:    Talks on phone: Not on file    Gets together: Not on file    Attends religious service: Not on file    Active member of club or organization: Not on file    Attends meetings of clubs or organizations: Not on file    Relationship status: Not on file  Other Topics Concern  . Not on file  Social History Narrative  . Not on file   Family History  Problem Relation Age of Onset  . Heart attack Brother 34  . Parkinsonism Mother   . Stroke Mother   . Heart attack Father        pre 62  . Diabetes Sister   . COPD Sister        X 3  . Other Son        suicide 2016  .  Colon cancer Neg Hx   . Cancer Neg Hx   . Esophageal cancer Neg Hx   . Liver cancer Neg Hx   . Pancreatic cancer Neg Hx   . Rectal cancer Neg Hx   . Stomach cancer Neg Hx    Allergies  Allergen Reactions  . Simvastatin     myalgias   Prior to Admission medications   Medication Sig Start Date End Date Taking? Authorizing Provider  acetaminophen (TYLENOL) 500 MG tablet Take 1,000 mg by mouth every 6 (six) hours as needed for headache.   Yes [provider]  alendronate (FOSAMAX) 70 MG tablet Take 70 mg by mouth once a week. On Friday 01/27/18  Yes [provider]  apixaban (ELIQUIS) 5 MG TABS tablet Take 1 tablet (5 mg total) by mouth 2 (two) times daily. 12/21/17  Yes [name], MD  Calcium Carbonate-Vitamin D (CALCIUM 600/VITAMIN D PO) Take 1 tablet by mouth daily.    Yes [provider]  CARTIA XT 120 MG 24 hr capsule Take 120 mg by mouth daily.  11/19/17  Yes [provider]  Cholecalciferol (VITAMIN D3) 1000 UNITS CAPS Take 3,000 Units by mouth daily with lunch.    Yes [provider]  diltiazem (CARDIZEM) 30 MG tablet Take 1 tablet (30 mg total) by mouth once as needed for up to 1 dose. Patient taking differently: Take 30 mg by mouth once as needed (for afib).  09/07/17  Yes [name], MD  furosemide (LASIX) 40 MG tablet TAKE ONE TABLET BY MOUTH DAILY Patient taking differently: Take 40 mg by mouth daily.  08/08/17  Yes Burns, Claudina Lick, MD  gabapentin (NEURONTIN) 100 MG capsule Take 500 mg by mouth at bedtime.  08/05/17  Yes [provider]  LORazepam (ATIVAN) 1 MG tablet Take 1 tablet (1 mg total) by mouth at bedtime. 02/06/18  Yes Burns, Claudina Lick, MD  montelukast (SINGULAIR) 10 MG tablet Take 1 tablet (10 mg total) by mouth daily. 11/21/17  Yes Burns, Claudina Lick, MD  potassium chloride SA (K-DUR,KLOR-CON) 20 MEQ tablet Take 2 tablets (40 mEq total) by mouth daily. 07/26/17  Yes Burns, Claudina Lick, MD  pravastatin (PRAVACHOL) 40 MG  tablet TAKE 1 TABLET DAILY BY MOUTH AT BEDTIME Patient taking differently: Take 40  mg by mouth at bedtime.  11/21/17  Yes Burns, Claudina Lick, MD  SYMBICORT 160-4.5 MCG/ACT inhaler INHALE 1 OR 2 PUFFS INTO THE LUNGS EVERY 12 HOURS. THEN GARGLE AND SPIT AFTER USE Patient taking differently: Inhale 2 puffs into the lungs 2 (two) times daily.  09/26/17  Yes Burns, Claudina Lick, MD  theophylline (UNIPHYL) 400 MG 24 hr tablet TAKE 1 TABLET (400 MG TOTAL) BY MOUTH DAILY. Patient taking differently: Take 200 mg by mouth 2 (two) times daily.  07/04/17  Yes Burns, Claudina Lick, MD  traMADol (ULTRAM) 50 MG tablet Take 1-2 tablets (50-100 mg total) by mouth every 8 (eight) hours as needed. 07/27/17  Yes Burns, Claudina Lick, MD  trimethoprim (TRIMPEX) 100 MG tablet Take 100 mg by mouth as needed (bladder problems during travel).  09/15/11  Yes [provider]  VENTOLIN HFA 108 (90 Base) MCG/ACT inhaler Inhale 1-2 puffs into the lungs every 6 (six) hours as needed for wheezing or shortness of breath. 10/24/17  Yes [name], MD   Ct Head Wo Contrast  Result Date: 03/18/2018 CLINICAL DATA:  Fall, headache EXAM: CT HEAD WITHOUT CONTRAST CT CERVICAL SPINE WITHOUT CONTRAST TECHNIQUE: Multidetector CT imaging of the head and cervical spine was performed following the standard protocol without intravenous contrast. Multiplanar CT image reconstructions of the cervical spine were also generated. COMPARISON:  11/27/2007 FINDINGS: CT HEAD FINDINGS Brain: Mild age related volume loss. No acute intracranial abnormality. Specifically, no hemorrhage, hydrocephalus, mass lesion, acute infarction, or significant intracranial injury. Vascular: No hyperdense vessel or unexpected calcification. Skull: No acute calvarial abnormality. Sinuses/Orbits: Visualized paranasal sinuses and mastoids clear. Orbital soft tissues unremarkable. Other: None CT CERVICAL SPINE FINDINGS Alignment: 3 mm of anterolisthesis of C4 on C5 related to facet disease. Skull  base and vertebrae: No acute fracture. No primary bone lesion or focal pathologic process. Soft tissues and spinal canal: No prevertebral fluid or swelling. No visible canal hematoma. Disc levels: Degenerative disc and facet disease diffusely throughout the cervical spine. Disc disease is most pronounced at C5-6 and C6-7. Facet disease is most pronounced at C3-4 and C4-5. Upper chest: No acute findings Other: No acute findings IMPRESSION: No acute intracranial abnormality. No acute bony abnormality in the cervical spine. Degenerative disc and facet disease. Electronically Signed   By: Rolm Baptise M.D.   On: 03/18/2018 08:53   Ct Cervical Spine Wo Contrast  Result Date: 03/18/2018 CLINICAL DATA:  Fall, headache EXAM: CT HEAD WITHOUT CONTRAST CT CERVICAL SPINE WITHOUT CONTRAST TECHNIQUE: Multidetector CT imaging of the head and cervical spine was performed following the standard protocol without intravenous contrast. Multiplanar CT image reconstructions of the cervical spine were also generated. COMPARISON:  11/27/2007 FINDINGS: CT HEAD FINDINGS Brain: Mild age related volume loss. No acute intracranial abnormality. Specifically, no hemorrhage, hydrocephalus, mass lesion, acute infarction, or significant intracranial injury. Vascular: No hyperdense vessel or unexpected calcification. Skull: No acute calvarial abnormality. Sinuses/Orbits: Visualized paranasal sinuses and mastoids clear. Orbital soft tissues unremarkable. Other: None CT CERVICAL SPINE FINDINGS Alignment: 3 mm of anterolisthesis of C4 on C5 related to facet disease. Skull base and vertebrae: No acute fracture. No primary bone lesion or focal pathologic process. Soft tissues and spinal canal: No prevertebral fluid or swelling. No visible canal hematoma. Disc levels: Degenerative disc and facet disease diffusely throughout the cervical spine. Disc disease is most pronounced at C5-6 and C6-7. Facet disease is most pronounced at C3-4 and C4-5. Upper  chest: No acute findings Other: No acute findings  IMPRESSION: No acute intracranial abnormality. No acute bony abnormality in the cervical spine. Degenerative disc and facet disease. Electronically Signed   By: Rolm Baptise M.D.   On: 03/18/2018 08:53   Ct Hip Right Wo Contrast  Result Date: 03/18/2018 CLINICAL DATA:  Right hip pain due to a fall on steps today. Initial encounter. EXAM: CT OF THE RIGHT HIP WITHOUT CONTRAST TECHNIQUE: Multidetector CT imaging of the right hip was performed according to the standard protocol. Multiplanar CT image reconstructions were also generated. COMPARISON:  Plain films right hip this same day. FINDINGS: Bones/Joint/Cartilage Mildly impacted subcapital right hip fracture is identified as seen on the comparison plain films. No other fracture is seen. 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 Degenerative change about the symphysis pubis also noted. Ligaments Suboptimally assessed by CT. Muscles and Tendons Appear intact. Soft tissues Imaged intrapelvic contents demonstrate no acute abnormality. Atherosclerosis and diverticulosis noted. IMPRESSION: Mildly impacted subcapital fracture right hip. No other acute finding. Electronically Signed   By: Inge Rise M.D.   On: 03/18/2018 12:34   Dg Chest Port 1 View  Result Date: 03/18/2018 CLINICAL DATA:  Pt missed a step at home and fell onto her right side this AM; pt complains of lateral right hip pain that radiates down right leg; hx of HTN, MVP, and A-fib; former smoker EXAM: PORTABLE CHEST - 1 VIEW COMPARISON:  09/07/2017 FINDINGS: Lungs are clear. Heart size and mediastinal contours are within normal limits. Aortic Atherosclerosis (ICD10-170.0). No effusion.  No pneumothorax. Visualized bones unremarkable. IMPRESSION: No acute cardiopulmonary disease. Electronically Signed   By: Lucrezia Europe M.D.   On: 03/18/2018 08:58   Dg Knee Right Port  Result Date: 03/18/2018 CLINICAL DATA:  Pt missed a step at home and  fell onto her right side this AM; pt complains of lateral right hip pain that radiates down right leg EXAM: PORTABLE RIGHT KNEE - 1-2 VIEW COMPARISON:  None. FINDINGS: No evidence of fracture, dislocation, or joint effusion. Mild diffuse osteopenia. No evidence of arthropathy or other focal bone abnormality. Soft tissues are unremarkable. IMPRESSION: Negative. Electronically Signed   By: Lucrezia Europe M.D.   On: 03/18/2018 08:59   Dg Hip Unilat With Pelvis 2-3 Views Right  Result Date: 03/18/2018 CLINICAL DATA:  Pt missed a step at home and fell onto her right side this AM; pt complains of lateral right hip pain that radiates down right leg EXAM: DG HIP (WITH OR WITHOUT PELVIS) 2-3V RIGHT COMPARISON:  None. FINDINGS: Mildly impacted, minimally comminuted mid cervical fracture of the proximal right femur. No significant angulation deformity. No dislocation. Pelvis intact. Mild spondylitic changes in the lower lumbar spine. IMPRESSION: 1. Impacted mid cervical fracture, proximal right femur. Electronically Signed   By: Lucrezia Europe M.D.   On: 03/18/2018 09:01    Positive ROS: All other systems have been reviewed and were otherwise negative with the exception of those mentioned in the HPI and as above.  Physical Exam: General: Alert, no acute distress Cardiovascular: No pedal edema Respiratory: No cyanosis, no use of accessory musculature GI: No organomegaly, abdomen is soft and non-tender Skin: No lesions in the area of chief complaint Neurologic: Sensation intact distally Psychiatric: Patient is competent for consent with normal mood and affect Lymphatic: No axillary or cervical lymphadenopathy  MUSCULOSKELETAL: Examination of the right lower extremity reveals no skin wounds or lesions.  She is able to straight leg raise, however she does have significant groin pain.  There is no shortening  or rotational deformity.  She has pain with range of motion of the hip.  She is neurovascularly intact  distally.  Assessment: Multiple medical problems. Valgus impacted right femoral neck fracture. Chronic Eliquis usage. Temporal arteritis with prednisone usage.  Plan: I discussed the findings with the patient and her family.  This is a very challenging situation.  After thoughtful consideration of the patient's x-rays, CT scan, and clinical picture, I recommend cannulated pinning of the right hip, which is a much smaller operation than an arthroplasty.  We will tentatively plan for surgery on Monday.  She will need cardiology clearance and treatment of her A. fib with RVR.  EDP plans for hospitalist admission for medical stabilization.  We did discuss the risk, benefits, and alternatives.  If the fracture does not heal, then she will require conversion to an arthroplasty procedure in the future.  N.p.o. after midnight on Sunday night.  Hold chemical DVT prophylaxis.  Bedrest for now.  The risks, benefits, and alternatives were discussed with the patient. There are risks associated with the surgery including, but not limited to, problems with anesthesia (death), infection, differences in leg length/angulation/rotation, fracture of bones, loosening or failure of implants, malunion, nonunion, hematoma (blood accumulation) which may require surgical drainage, blood clots, pulmonary embolism, nerve injury (foot drop), and blood vessel injury. The patient understands these risks and elects to proceed.   Bertram Savin, MD Cell [phone]    03/18/2018 1:47 PM

## 2018-03-18 NOTE — ED Triage Notes (Signed)
The pt arrived by [facility] from home  She missed a step and fell onto her rt side and rt head

## 2018-03-19 DIAGNOSIS — M316 Other giant cell arteritis: Secondary | ICD-10-CM

## 2018-03-19 DIAGNOSIS — S72001A Fracture of unspecified part of neck of right femur, initial encounter for closed fracture: Secondary | ICD-10-CM

## 2018-03-19 DIAGNOSIS — I10 Essential (primary) hypertension: Secondary | ICD-10-CM

## 2018-03-19 DIAGNOSIS — M25551 Pain in right hip: Secondary | ICD-10-CM

## 2018-03-19 LAB — APTT
aPTT: 71 seconds — ABNORMAL HIGH (ref 24–36)
aPTT: 90 seconds — ABNORMAL HIGH (ref 24–36)

## 2018-03-19 LAB — HEPARIN LEVEL (UNFRACTIONATED)
Heparin Unfractionated: 1.26 IU/mL — ABNORMAL HIGH (ref 0.30–0.70)
Heparin Unfractionated: 0.78 IU/mL — ABNORMAL HIGH (ref 0.30–0.70)

## 2018-03-19 LAB — CBC
HCT: 40.4 % (ref 36.0–46.0)
Hemoglobin: 13.2 g/dL (ref 12.0–15.0)
MCH: 36.2 pg — ABNORMAL HIGH (ref 26.0–34.0)
MCHC: 32.7 g/dL (ref 30.0–36.0)
MCV: 110.7 fL — ABNORMAL HIGH (ref 78.0–100.0)
Platelets: 247 10*3/uL (ref 150–400)
RBC: 3.65 MIL/uL — ABNORMAL LOW (ref 3.87–5.11)
RDW: 16.8 % — ABNORMAL HIGH (ref 11.5–15.5)
WBC: 15.6 10*3/uL — ABNORMAL HIGH (ref 4.0–10.5)

## 2018-03-19 LAB — TYPE AND SCREEN
ABO/RH(D): O POS
Antibody Screen: NEGATIVE

## 2018-03-19 LAB — MRSA PCR SCREENING: MRSA by PCR: NEGATIVE

## 2018-03-19 MED ORDER — POVIDONE-IODINE 10 % EX SWAB: 2.0000 "application " | Freq: Once | CUTANEOUS | Status: DC

## 2018-03-19 MED ORDER — CHLORHEXIDINE GLUCONATE 4 % EX LIQD
60.0000 mL | Freq: Once | CUTANEOUS | Status: AC
  Administered 2018-03-20: 4 via TOPICAL
  Filled 2018-03-19: qty 15

## 2018-03-19 MED ORDER — AMIODARONE LOAD VIA INFUSION
150.0000 mg | Freq: Once | INTRAVENOUS | Status: AC
  Administered 2018-03-19: 150 mg via INTRAVENOUS
  Filled 2018-03-19: qty 83.34

## 2018-03-19 MED ORDER — CEFAZOLIN SODIUM-DEXTROSE 2-4 GM/100ML-% IV SOLN
2.0000 g | INTRAVENOUS | Status: AC
  Filled 2018-03-19: qty 100

## 2018-03-19 NOTE — Progress Notes (Signed)
Pt blood pressure 84/70. Pt asymptomatic. On call provider notified. Will continue to monitor.

## 2018-03-19 NOTE — Progress Notes (Signed)
Progress Note  Patient Name: Crystal [name] Date of Encounter: 03/19/2018  Primary Cardiologist: [name], MD   Subjective   Remains in A. fib with a ventricular response in the low 100 range on IV amiodarone.  Her Eliquis was discontinued and she was begun on IV heparin.  She is less uncomfortable from her right hip fracture on analgesics with an intent to perform orthopedic surgery by Dr. Lyla Glassing tomorrow.  Inpatient Medications    Scheduled Meds: . calcium-vitamin D  1 tablet Oral Q breakfast  . cholecalciferol  3,000 Units Oral Q lunch  . furosemide  40 mg Oral Daily  . gabapentin  500 mg Oral QHS  . LORazepam  1 mg Oral QHS  . mometasone-formoterol  2 puff Inhalation BID  . montelukast  10 mg Oral QHS  . potassium chloride SA  40 mEq Oral Daily  . predniSONE  7.5 mg Oral Q breakfast  . saccharomyces boulardii  250 mg Oral Daily  . theophylline  200 mg Oral BID   Continuous Infusions: . amiodarone 30 mg/hr (03/19/18 0924)  . heparin 700 Units/hr (03/18/18 1800)   PRN Meds: acetaminophen, fentaNYL (SUBLIMAZE) injection, ondansetron (ZOFRAN) IV, oxyCODONE-acetaminophen   Vital Signs    Vitals:   03/19/18 0800 03/19/18 0900 03/19/18 0930 03/19/18 0935  BP: (!) 96/59 (!) 99/58 92/66 (!) 89/60  Pulse: (!) 138     Resp: 17 (!) 24 14 18   Temp: 97.9 F (36.6 C)     TempSrc: Oral     SpO2: 96%  96% 96%  Weight:      Height:        Intake/Output Summary (Last 24 hours) at 03/19/2018 1105 Last data filed at 03/19/2018 0800 Gross per 24 hour  Intake 715.56 ml  Output 475 ml  Net 240.56 ml   Filed Weights   03/18/18 0627  Weight: 50.8 kg    Telemetry    Atrial fibrillation with ventricular response in the low 100 range- Personally Reviewed  ECG    Not performed today- Personally Reviewed  Physical Exam   GEN: No acute distress.   Neck: No JVD Cardiac:  Irregularly irregular and tachycardic, no murmurs, rubs, or gallops.  Respiratory: Clear to  auscultation bilaterally. GI: Soft, nontender, non-distended  MS: No edema; No deformity. Neuro:  Nonfocal  Psych: Normal affect   Labs    Chemistry Recent Labs  Lab 03/18/18 0652  NA 137  K 3.8  CL 94*  CO2 29  GLUCOSE 81  BUN 14  CREATININE 0.74  CALCIUM 9.8  PROT 6.4*  ALBUMIN 4.0  AST 20  ALT 24  ALKPHOS 53  BILITOT 1.0  GFRNONAA >60  GFRAA >60  ANIONGAP 14     Hematology Recent Labs  Lab 03/18/18 0652 03/19/18 0505  WBC 12.3* 15.6*  RBC 4.00 3.65*  HGB 14.3 13.2  HCT 43.1 40.4  MCV 107.8* 110.7*  MCH 35.8* 36.2*  MCHC 33.2 32.7  RDW 15.9* 16.8*  PLT 289 247    Cardiac EnzymesNo results for input(s): TROPONINI in the last 168 hours. No results for input(s): TROPIPOC in the last 168 hours.   BNP Recent Labs  Lab 03/18/18 0652  BNP 534.0*     DDimer No results for input(s): DDIMER in the last 168 hours.   Radiology    Ct Head Wo Contrast  Result Date: 03/18/2018 CLINICAL DATA:  Fall, headache EXAM: CT HEAD WITHOUT CONTRAST CT CERVICAL SPINE WITHOUT CONTRAST TECHNIQUE: Multidetector CT imaging  of the head and cervical spine was performed following the standard protocol without intravenous contrast. Multiplanar CT image reconstructions of the cervical spine were also generated. COMPARISON:  11/27/2007 FINDINGS: CT HEAD FINDINGS Brain: Mild age related volume loss. No acute intracranial abnormality. Specifically, no hemorrhage, hydrocephalus, mass lesion, acute infarction, or significant intracranial injury. Vascular: No hyperdense vessel or unexpected calcification. Skull: No acute calvarial abnormality. Sinuses/Orbits: Visualized paranasal sinuses and mastoids clear. Orbital soft tissues unremarkable. Other: None CT CERVICAL SPINE FINDINGS Alignment: 3 mm of anterolisthesis of C4 on C5 related to facet disease. Skull base and vertebrae: No acute fracture. No primary bone lesion or focal pathologic process. Soft tissues and spinal canal: No prevertebral  fluid or swelling. No visible canal hematoma. Disc levels: Degenerative disc and facet disease diffusely throughout the cervical spine. Disc disease is most pronounced at C5-6 and C6-7. Facet disease is most pronounced at C3-4 and C4-5. Upper chest: No acute findings Other: No acute findings IMPRESSION: No acute intracranial abnormality. No acute bony abnormality in the cervical spine. Degenerative disc and facet disease. Electronically Signed   By: Rolm Baptise M.D.   On: 03/18/2018 08:53   Ct Cervical Spine Wo Contrast  Result Date: 03/18/2018 CLINICAL DATA:  Fall, headache EXAM: CT HEAD WITHOUT CONTRAST CT CERVICAL SPINE WITHOUT CONTRAST TECHNIQUE: Multidetector CT imaging of the head and cervical spine was performed following the standard protocol without intravenous contrast. Multiplanar CT image reconstructions of the cervical spine were also generated. COMPARISON:  11/27/2007 FINDINGS: CT HEAD FINDINGS Brain: Mild age related volume loss. No acute intracranial abnormality. Specifically, no hemorrhage, hydrocephalus, mass lesion, acute infarction, or significant intracranial injury. Vascular: No hyperdense vessel or unexpected calcification. Skull: No acute calvarial abnormality. Sinuses/Orbits: Visualized paranasal sinuses and mastoids clear. Orbital soft tissues unremarkable. Other: None CT CERVICAL SPINE FINDINGS Alignment: 3 mm of anterolisthesis of C4 on C5 related to facet disease. Skull base and vertebrae: No acute fracture. No primary bone lesion or focal pathologic process. Soft tissues and spinal canal: No prevertebral fluid or swelling. No visible canal hematoma. Disc levels: Degenerative disc and facet disease diffusely throughout the cervical spine. Disc disease is most pronounced at C5-6 and C6-7. Facet disease is most pronounced at C3-4 and C4-5. Upper chest: No acute findings Other: No acute findings IMPRESSION: No acute intracranial abnormality. No acute bony abnormality in the cervical  spine. Degenerative disc and facet disease. Electronically Signed   By: Rolm Baptise M.D.   On: 03/18/2018 08:53   Ct Hip Right Wo Contrast  Result Date: 03/18/2018 CLINICAL DATA:  Right hip pain due to a fall on steps today. Initial encounter. EXAM: CT OF THE RIGHT HIP WITHOUT CONTRAST TECHNIQUE: Multidetector CT imaging of the right hip was performed according to the standard protocol. Multiplanar CT image reconstructions were also generated. COMPARISON:  Plain films right hip this same day. FINDINGS: Bones/Joint/Cartilage Mildly impacted subcapital right hip fracture is identified as seen on the comparison plain films. No other fracture is seen. 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 Degenerative change about the symphysis pubis also noted. Ligaments Suboptimally assessed by CT. Muscles and Tendons Appear intact. Soft tissues Imaged intrapelvic contents demonstrate no acute abnormality. Atherosclerosis and diverticulosis noted. IMPRESSION: Mildly impacted subcapital fracture right hip. No other acute finding. Electronically Signed   By: Inge Rise M.D.   On: 03/18/2018 12:34   Dg Chest Port 1 View  Result Date: 03/18/2018 CLINICAL DATA:  Pt missed a step at home and fell onto  her right side this AM; pt complains of lateral right hip pain that radiates down right leg; hx of HTN, MVP, and A-fib; former smoker EXAM: PORTABLE CHEST - 1 VIEW COMPARISON:  09/07/2017 FINDINGS: Lungs are clear. Heart size and mediastinal contours are within normal limits. Aortic Atherosclerosis (ICD10-170.0). No effusion.  No pneumothorax. Visualized bones unremarkable. IMPRESSION: No acute cardiopulmonary disease. Electronically Signed   By: Lucrezia Europe M.D.   On: 03/18/2018 08:58   Dg Knee Right Port  Result Date: 03/18/2018 CLINICAL DATA:  Pt missed a step at home and fell onto her right side this AM; pt complains of lateral right hip pain that radiates down right leg EXAM: PORTABLE RIGHT KNEE - 1-2 VIEW  COMPARISON:  None. FINDINGS: No evidence of fracture, dislocation, or joint effusion. Mild diffuse osteopenia. No evidence of arthropathy or other focal bone abnormality. Soft tissues are unremarkable. IMPRESSION: Negative. Electronically Signed   By: Lucrezia Europe M.D.   On: 03/18/2018 08:59   Dg Hip Unilat With Pelvis 2-3 Views Right  Result Date: 03/18/2018 CLINICAL DATA:  Pt missed a step at home and fell onto her right side this AM; pt complains of lateral right hip pain that radiates down right leg EXAM: DG HIP (WITH OR WITHOUT PELVIS) 2-3V RIGHT COMPARISON:  None. FINDINGS: Mildly impacted, minimally comminuted mid cervical fracture of the proximal right femur. No significant angulation deformity. No dislocation. Pelvis intact. Mild spondylitic changes in the lower lumbar spine. IMPRESSION: 1. Impacted mid cervical fracture, proximal right femur. Electronically Signed   By: Lucrezia Europe M.D.   On: 03/18/2018 09:01    Cardiac Studies   None performed  Patient Profile     Crystal [name] is a 72 y.o. female with past medical history of PAF (on Eliquis), giant cell arteritis (now legally blind), HTN, HLD, and PVD (known right SFA occlusion) who we were asked to see for new onset A. fib with RVR in the setting of a hip fracture and extreme pain on Eliquis oral anticoagulation at home.  She is scheduled for right hip orthopedic surgical procedure tomorrow by Dr. Lyla Glassing.  Assessment & Plan    1: Paroxysmal atrial relation- history of PAF on Eliquis oral anticoagulation at home.  She had a hip fracture and developed A. fib with RVR after that.  She was somewhat hypotensive.  Amiodarone was begun IV.  Heart rate is now in the low 100 range.  Her Eliquis was held and she was begun on IV heparin as a "bridge" because of prior history of thromboembolism to her right SFA requiring embolectomy.  2: Preoperative clearance- patient cleared for upcoming surgery at low risk.  She will need to be transitioned back  to Eliquis postoperatively.  If she does not convert pharmacologically on amiodarone she may need DC cardioversion back to sinus rhythm prior to discharge home.     For questions or updates, please contact [place] Please consult [email or web address] for contact info under        Signed, [name], MD  03/19/2018, 11:05 AM

## 2018-03-19 NOTE — Progress Notes (Signed)
ANTICOAGULATION CONSULT NOTE - Follow-up Consult  Pharmacy Consult:  Heparin  Indication: atrial fibrillation  Allergies  Allergen Reactions  . Simvastatin     myalgias    Patient Measurements: Height: 5\' 3"  (160 cm) Weight: 112 lb (50.8 kg) IBW/kg (Calculated) : 52.4 Heparin Dosing Weight: 50 kg  Vital Signs: Temp: 98.1 F (36.7 C) (10/06 0001) Temp Source: Oral (10/06 0001) BP: 87/63 (10/06 0001) Pulse Rate: 130 (10/05 1816)  Labs: Recent Labs    03/18/18 0652 03/18/18 2325  HGB 14.3  --   HCT 43.1  --   PLT 289  --   APTT  --  71*  LABPROT 12.2  --   INR 0.91  --   HEPARINUNFRC  --  1.26*  CREATININE 0.74  --     Estimated Creatinine Clearance: 49.5 mL/min (by C-G formula based on SCr of 0.74 mg/dL).   Assessment: 56 YOF presented with right hip fracture s/p fall on 03/17/18.  She has a history of AFib on Eliquis PTA, last dose 03/17/18 at 2200.  Pharmacy consulted to transition patient to IV heparin in anticipation of surgery. Heparin level elevated (1.26) due to Eliquis so utilizing PTT to monitor heparin until levels correlating.  PTT 71 sec (therapeutic) on gtt at 700 units/hr. No bleeding noted.  Goal of Therapy:  Heparin level 0.3-0.7 units/ml; PTT 66-102 sec Monitor platelets by anticoagulation protocol: Yes    Plan:  Continue Heparin gtt at 700 units/hr F/u a.m. PTT to confirm therapeutic    Sherlon Handing, PharmD, BCPS Clinical pharmacist  **Pharmacist phone directory can now be found on [email or web address] ([email or web address]).  Listed under [place]. 03/19/2018, 1:23 AM

## 2018-03-19 NOTE — Progress Notes (Addendum)
PROGRESS NOTE    Crystal [name]  QTM:[id number] DOB: May 22, 1946 DOA: 03/18/2018 PCP: [name], MD    Brief Narrative:  72 year old female who presented after fall.  Patient does have significant past medical history for paroxysmal atrial fibrillation, giant cell arteritis, hypertension, dyslipidemia and peripheral vascular disease.  Patient sustained a mechanical fall after missing a step and landing on her right side, post trauma she experienced significant pain, that prompted come to the hospital for further evaluation.  On her initial physical examination blood pressure 117/9, temperature 98.9, respiratory rate 21, oxygen saturation 96%, heart rate was 170 bpm.  Her lungs were clear to auscultation bilaterally, heart S1-S2 present, irregularly irregular, abdomen soft nontender, no lower extremity edema, right hip pain.  Her EKG showed atrial flutter 2:1, 167 bpm, left axis deviation, poor R wave progression.  Patient was admitted to the hospital with working diagnosis of acute right femoral fracture complicated with atrial flutter/ fibrillation with rapid ventricular response   Assessment & Plan:   Active Problems:   Atrial fibrillation ([place])   1. Atrial fibrillation with rapid ventricular response.  Continue rate control with amiodarone drip, patient had hypotension after IV metoprolol combined with IV fentanyl. Will continue anticoagulation with heparin due to history of thrombosis. Continue telemetry monitoring.  2. Right hip fracture. Will continue pain control with oxycodone and IV fentanyl, for surgery in am.   3. HTN. Continue blood pressure monitoring, currently with no antihypertensive agents.   4 Giant cell arteritis. Continue prednisone 7,5 mg daily, no need for stress dose steroids at this point, will continue close monitoring of blood pressure and electrolytes.   5. COPD. Stable with no signs of exacerbation, contnue dulera.   6. Acute on chronic diastolic heart  failure. Documented on admission, clinically has improved, today patient is more euvolemic.   DVT prophylaxis: heparin IV   Code Status:  full Family Communication: I spoke with patient's family at the bedside and all questions were addressed.  Disposition Plan/ discharge barriers: pending surgical intervention    Consultants:   Orthopedics  Cardiology   Procedures:     Antimicrobials:       Subjective: Positive right hip pain, moderate in intensity, worse with movement, improved with analgesics, no radiation, no palpitations, dyspnea or chest pain.   Objective: Vitals:   03/19/18 0800 03/19/18 0900 03/19/18 0930 03/19/18 0935  BP: (!) 96/59 (!) 99/58 92/66 (!) 89/60  Pulse: (!) 138     Resp: 17 (!) 24 14 18   Temp: 97.9 F (36.6 C)     TempSrc: Oral     SpO2: 96%  96% 96%  Weight:      Height:        Intake/Output Summary (Last 24 hours) at 03/19/2018 1219 Last data filed at 03/19/2018 0800 Gross per 24 hour  Intake 715.56 ml  Output 475 ml  Net 240.56 ml   Filed Weights   03/18/18 6256  Weight: 50.8 kg    Examination:   General: deconditioned and ill looking appearing  Neurology: Awake and alert, non focal  E ENT: mild pallor, no icterus, oral mucosa moist Cardiovascular: No JVD. S1-S2 present, rhythmic, no gallops, rubs, or murmurs. Trace lower extremity edema. Pulmonary: positive breath sounds bilaterally, adequate air movement, no wheezing, rhonchi or rales. Gastrointestinal. Abdomen with no organomegaly, non tender, no rebound or guarding Skin. No rashes Musculoskeletal: no joint deformities     Data Reviewed: I have personally reviewed following labs and imaging studies  CBC: Recent Labs  Lab 03/18/18 0652 03/19/18 0505  WBC 12.3* 15.6*  NEUTROABS 8.8*  --   HGB 14.3 13.2  HCT 43.1 40.4  MCV 107.8* 110.7*  PLT 289 342   Basic Metabolic Panel: Recent Labs  Lab 03/18/18 0652 03/18/18 1432  NA 137  --   K 3.8  --   CL 94*  --     CO2 29  --   GLUCOSE 81  --   BUN 14  --   CREATININE 0.74  --   CALCIUM 9.8  --   MG 1.9 1.7   GFR: Estimated Creatinine Clearance: 49.5 mL/min (by C-G formula based on SCr of 0.74 mg/dL). Liver Function Tests: Recent Labs  Lab 03/18/18 0652  AST 20  ALT 24  ALKPHOS 53  BILITOT 1.0  PROT 6.4*  ALBUMIN 4.0   No results for input(s): LIPASE, AMYLASE in the last 168 hours. No results for input(s): AMMONIA in the last 168 hours. Coagulation Profile: Recent Labs  Lab 03/18/18 0652  INR 0.91   Cardiac Enzymes: No results for input(s): CKTOTAL, CKMB, CKMBINDEX, TROPONINI in the last 168 hours. BNP (last 3 results) No results for input(s): PROBNP in the last 8760 hours. HbA1C: No results for input(s): HGBA1C in the last 72 hours. CBG: No results for input(s): GLUCAP in the last 168 hours. Lipid Profile: No results for input(s): CHOL, HDL, LDLCALC, TRIG, CHOLHDL, LDLDIRECT in the last 72 hours. Thyroid Function Tests: No results for input(s): TSH, T4TOTAL, FREET4, T3FREE, THYROIDAB in the last 72 hours. Anemia Panel: No results for input(s): VITAMINB12, FOLATE, FERRITIN, TIBC, IRON, RETICCTPCT in the last 72 hours.    Radiology Studies: I have reviewed all of the imaging during this hospital visit personally     Scheduled Meds: . calcium-vitamin D  1 tablet Oral Q breakfast  . chlorhexidine  60 mL Topical Once  . cholecalciferol  3,000 Units Oral Q lunch  . furosemide  40 mg Oral Daily  . gabapentin  500 mg Oral QHS  . LORazepam  1 mg Oral QHS  . mometasone-formoterol  2 puff Inhalation BID  . montelukast  10 mg Oral QHS  . potassium chloride SA  40 mEq Oral Daily  . povidone-iodine  2 application Topical Once  . predniSONE  7.5 mg Oral Q breakfast  . saccharomyces boulardii  250 mg Oral Daily  . theophylline  200 mg Oral BID   Continuous Infusions: . amiodarone 30 mg/hr (03/19/18 0924)  .  ceFAZolin (ANCEF) IV    . heparin 700 Units/hr (03/18/18 1800)      LOS: 1 day        Crystal Covell Gerome Apley, MD [facility] Pager [phone]

## 2018-03-19 NOTE — Progress Notes (Signed)
ANTICOAGULATION CONSULT NOTE - Follow-up Consult  Pharmacy Consult:  Heparin  Indication: atrial fibrillation  Allergies  Allergen Reactions  . Simvastatin     myalgias    Patient Measurements: Height: 5\' 3"  (160 cm) Weight: 112 lb (50.8 kg) IBW/kg (Calculated) : 52.4 Heparin Dosing Weight: 50 kg  Vital Signs: Temp: 97.9 F (36.6 C) (10/06 0800) Temp Source: Oral (10/06 0800) BP: 89/60 (10/06 0935) Pulse Rate: 138 (10/06 0800)  Labs: Recent Labs    03/18/18 0652 03/18/18 2325 03/19/18 0505  HGB 14.3  --  13.2  HCT 43.1  --  40.4  PLT 289  --  247  APTT  --  71* 90*  LABPROT 12.2  --   --   INR 0.91  --   --   HEPARINUNFRC  --  1.26* 0.78*  CREATININE 0.74  --   --     Estimated Creatinine Clearance: 49.5 mL/min (by C-G formula based on SCr of 0.74 mg/dL).   Assessment: Crystal [name] presented with right hip fracture s/p fall on 03/17/18.  She has a history of AFib on Eliquis PTA, last dose 03/17/18 at 2200.  Pharmacy consulted to transition patient to IV heparin in anticipation of surgery.   Heparin level remains falsely elevated due to recent Eliquis use, aPTT therapeutic, CBC stable.  Goal of Therapy:  Heparin level 0.3-0.7 units/ml; PTT 66-102 sec Monitor platelets by anticoagulation protocol: Yes    Plan:  -Continue heparin 700 units/hr -Daily aPTT, heparin level, CBC  Arrie Senate, PharmD, BCPS Clinical Pharmacist [phone] Please check AMION for all [place] numbers 03/19/2018

## 2018-03-19 NOTE — Progress Notes (Signed)
Pt's HR 145-155, BP 92/66 mmHg, A-fib with RVR, asymptomatic. Notified cardiologist on call , Dr. Haroldine Laws. Order received for one time dose of 150 mg Amiodarone bolus IV slowly for 20 minutes due to her BP. Continue to monitor.  Lorra Hals, RN

## 2018-03-20 ENCOUNTER — Encounter (HOSPITAL_COMMUNITY): Payer: Self-pay | Admitting: Anesthesiology

## 2018-03-20 ENCOUNTER — Encounter (HOSPITAL_COMMUNITY)
Admission: EM | Disposition: A | Discharge status: Rehabilitation Facility | Payer: Self-pay | Source: Home / Self Care | Attending: Internal Medicine

## 2018-03-20 DIAGNOSIS — E876 Hypokalemia: Secondary | ICD-10-CM

## 2018-03-20 LAB — CBC
HCT: 37.8 % (ref 36.0–46.0)
Hemoglobin: 12.3 g/dL (ref 12.0–15.0)
MCH: 35.9 pg — ABNORMAL HIGH (ref 26.0–34.0)
MCHC: 32.5 g/dL (ref 30.0–36.0)
MCV: 110.2 fL — ABNORMAL HIGH (ref 78.0–100.0)
Platelets: 228 10*3/uL (ref 150–400)
RBC: 3.43 MIL/uL — ABNORMAL LOW (ref 3.87–5.11)
RDW: 16.5 % — ABNORMAL HIGH (ref 11.5–15.5)
WBC: 11.8 10*3/uL — ABNORMAL HIGH (ref 4.0–10.5)

## 2018-03-20 LAB — BASIC METABOLIC PANEL
Anion gap: 11 (ref 5–15)
BUN: 11 mg/dL (ref 8–23)
CO2: 27 mmol/L (ref 22–32)
Calcium: 8.7 mg/dL — ABNORMAL LOW (ref 8.9–10.3)
Chloride: 96 mmol/L — ABNORMAL LOW (ref 98–111)
Creatinine, Ser: 0.7 mg/dL (ref 0.44–1.00)
GFR calc Af Amer: >60 mL/min (ref >60)
GFR calc non Af Amer: >60 mL/min (ref >60)
Glucose, Bld: 83 mg/dL (ref 70–99)
Potassium: 3.2 mmol/L — ABNORMAL LOW (ref 3.5–5.1)
Sodium: 134 mmol/L — ABNORMAL LOW (ref 135–145)

## 2018-03-20 LAB — HEPARIN LEVEL (UNFRACTIONATED): Heparin Unfractionated: 0.56 IU/mL (ref 0.30–0.70)

## 2018-03-20 LAB — APTT: aPTT: 77 seconds — ABNORMAL HIGH (ref 24–36)

## 2018-03-20 SURGERY — FIXATION, FEMUR, NECK, PERCUTANEOUS, USING SCREW
Anesthesia: General | Laterality: Right

## 2018-03-20 MED ORDER — POTASSIUM CHLORIDE CRYS ER 20 MEQ PO TBCR
40.0000 meq | EXTENDED_RELEASE_TABLET | ORAL | Status: AC
  Administered 2018-03-20 – 2018-03-21 (×2): 40 meq via ORAL
  Filled 2018-03-20 (×2): qty 2

## 2018-03-20 MED ORDER — POTASSIUM CHLORIDE CRYS ER 20 MEQ PO TBCR
40.0000 meq | EXTENDED_RELEASE_TABLET | Freq: Once | ORAL | Status: AC
  Administered 2018-03-20: 40 meq via ORAL
  Filled 2018-03-20: qty 2

## 2018-03-20 MED ORDER — DIGOXIN 0.25 MG/ML IJ SOLN
0.2500 mg | Freq: Once | INTRAMUSCULAR | Status: AC
  Administered 2018-03-20: 0.25 mg via INTRAVENOUS
  Filled 2018-03-20: qty 1

## 2018-03-20 MED ORDER — OXYCODONE HCL 5 MG PO TABS
5.0000 mg | ORAL_TABLET | ORAL | Status: DC | PRN
  Administered 2018-03-20 – 2018-03-24 (×19): 10 mg via ORAL
  Filled 2018-03-20 (×19): qty 2

## 2018-03-20 MED ORDER — DIGOXIN 0.25 MG/ML IJ SOLN
0.1250 mg | Freq: Once | INTRAMUSCULAR | Status: AC
  Administered 2018-03-20: 0.125 mg via INTRAVENOUS
  Filled 2018-03-20: qty 0.5

## 2018-03-20 MED ORDER — SODIUM CHLORIDE 0.9 % IV BOLUS
250.0000 mL | Freq: Once | INTRAVENOUS | Status: AC
  Administered 2018-03-20: 16:00:00 via INTRAVENOUS

## 2018-03-20 MED ORDER — ACETAMINOPHEN 325 MG PO TABS
650.0000 mg | ORAL_TABLET | ORAL | Status: DC | PRN
  Administered 2018-03-22 – 2018-03-23 (×2): 650 mg via ORAL
  Filled 2018-03-20 (×2): qty 2

## 2018-03-20 MED ORDER — TRAMADOL HCL 50 MG PO TABS
50.0000 mg | ORAL_TABLET | Freq: Four times a day (QID) | ORAL | Status: DC | PRN
  Administered 2018-03-22 – 2018-03-24 (×4): 50 mg via ORAL
  Filled 2018-03-20 (×4): qty 1

## 2018-03-20 MED ORDER — MORPHINE SULFATE (PF) 2 MG/ML IV SOLN: 2.0000 mg | INTRAVENOUS | Status: DC | PRN

## 2018-03-20 MED ORDER — SODIUM CHLORIDE 0.9 % IV SOLN
INTRAVENOUS | Status: DC
  Administered 2018-03-20 (×2): via INTRAVENOUS

## 2018-03-20 MED ORDER — FENTANYL CITRATE (PF) 100 MCG/2ML IJ SOLN: 50.0000 ug | INTRAMUSCULAR | Status: DC | PRN

## 2018-03-20 MED ORDER — FENTANYL CITRATE (PF) 100 MCG/2ML IJ SOLN
25.0000 ug | INTRAMUSCULAR | Status: DC | PRN
  Administered 2018-03-20 – 2018-03-22 (×9): 25 ug via INTRAVENOUS
  Filled 2018-03-20 (×10): qty 2

## 2018-03-20 MED ORDER — MAGNESIUM SULFATE 2 GM/50ML IV SOLN
2.0000 g | Freq: Once | INTRAVENOUS | Status: AC
  Administered 2018-03-20: 2 g via INTRAVENOUS
  Filled 2018-03-20: qty 50

## 2018-03-20 NOTE — Progress Notes (Signed)
  OR called and requested heparin be discontinued for possible surgery this afternoon. Jerald Kief, RN

## 2018-03-20 NOTE — Progress Notes (Signed)
Spoke with Dr. Harrington Challenger regarding Digoxin doses. 1 dose administered atr 10:30 and the other moved 4 hrs later. Crystal [name]

## 2018-03-20 NOTE — Progress Notes (Signed)
[facility] TEAM 1 - Stepdown/ICU TEAM  Crystal [name]  CZY:[id number] DOB: [birth year] DOA: 03/18/2018 PCP: [name], MD    Brief Narrative:  72 year old female w/ a hx of paroxysmal atrial fibrillation, giant cell arteritis, hypertension, dyslipidemia, and peripheral vascular disease who presented after a mechanical fall (missed a step). She was found to have an acute right femoral fracture complicated with atrial flutter/ fibrillation with RVR.  Subjective: Remarkably comfortable in bed. Denies cp, sob, n/v, or abdom pain. Reports signif amount of pain in her R hip.   Assessment & Plan:  Atrial fibrillation with rapid ventricular response On chronic Eliquis - using IV heparin periop - IV amio per Cards w/ digoxin being added due to difficult to control RVR   Right hip fracture Pain meds as needed awaiting surgery - postponed today due to RVR   Hypokalemia Correct to goal of 4.0  Hypomagnesemia   Correct to goal of 2.0  HTN BP currently well controlled   Giant cell arteritis Continue prednisone 7.5mg  QD  COPD Stable at this time   DVT prophylaxis: IV heparin  Code Status: FULL CODE Family Communication: spoke w/ family at bedside  Disposition Plan: SDU - awaiting OR   Consultants:  Cardiology  Orthopedics   Antimicrobials:  none  Objective: Blood pressure 101/83, pulse (!) 125, temperature 98.4 F (36.9 C), temperature source Oral, resp. rate 14, height 5\' 3"  (1.6 m), weight 50.8 kg, SpO2 95 %.  Intake/Output Summary (Last 24 hours) at 03/20/2018 1511 Last data filed at 03/20/2018 1500 Gross per 24 hour  Intake 1098.63 ml  Output 1300 ml  Net -201.37 ml   Filed Weights   03/18/18 0627  Weight: 50.8 kg    Examination: General: No acute respiratory distress Lungs: Clear to auscultation bilaterally without wheezes or crackles Cardiovascular: RVR w/ HR 130 - no M or rub  Abdomen: Nontender, nondistended, soft, bowel sounds positive, no rebound, no  ascites, no appreciable mass Extremities: trace edema R > L LE   CBC: Recent Labs  Lab 03/18/18 0652 03/19/18 0505 03/20/18 0329  WBC 12.3* 15.6* 11.8*  NEUTROABS 8.8*  --   --   HGB 14.3 13.2 12.3  HCT 43.1 40.4 37.8  MCV 107.8* 110.7* 110.2*  PLT 289 247 093   Basic Metabolic Panel: Recent Labs  Lab 03/18/18 0652 03/18/18 1432 03/20/18 0329  NA 137  --  134*  K 3.8  --  3.2*  CL 94*  --  96*  CO2 29  --  27  GLUCOSE 81  --  83  BUN 14  --  11  CREATININE 0.74  --  0.70  CALCIUM 9.8  --  8.7*  MG 1.9 1.7  --    GFR: Estimated Creatinine Clearance: 49.5 mL/min (by C-G formula based on SCr of 0.7 mg/dL).  Liver Function Tests: Recent Labs  Lab 03/18/18 0652  AST 20  ALT 24  ALKPHOS 53  BILITOT 1.0  PROT 6.4*  ALBUMIN 4.0    Coagulation Profile: Recent Labs  Lab 03/18/18 0652  INR 0.91     Recent Results (from the past 240 hour(s))  MRSA PCR Screening     Status: None   Collection Time: 03/19/18  4:35 AM  Result Value Ref Range Status   MRSA by PCR NEGATIVE NEGATIVE Final    Comment:        The GeneXpert MRSA Assay (FDA approved for NASAL specimens only), is one component of a comprehensive MRSA  colonization surveillance program. It is not intended to diagnose MRSA infection nor to guide or monitor treatment for MRSA infections. Performed at [facility], [place] [address] [place], [place] [address]      Scheduled Meds: . calcium-vitamin D  1 tablet Oral Q breakfast  . cholecalciferol  3,000 Units Oral Q lunch  . digoxin  0.25 mg Intravenous Once  . gabapentin  500 mg Oral QHS  . LORazepam  1 mg Oral QHS  . mometasone-formoterol  2 puff Inhalation BID  . montelukast  10 mg Oral QHS  . povidone-iodine  2 application Topical Once  . predniSONE  7.5 mg Oral Q breakfast  . saccharomyces boulardii  250 mg Oral Daily  . theophylline  200 mg Oral BID   Continuous Infusions: . amiodarone 36 mg/hr (03/20/18 1441)     LOS: 2 days    Cherene Altes, MD [facility] Office  [phone] Pager - Text Page per Amion  If 7PM-7AM, please contact night-coverage per Amion 03/20/2018, 3:11 PM

## 2018-03-20 NOTE — Anesthesia Preprocedure Evaluation (Deleted)
Anesthesia Evaluation    Reviewed: Allergy & Precautions, Patient's Chart, lab work & pertinent test results  History of Anesthesia Complications Negative for: history of anesthetic complications  Airway        Dental   Pulmonary asthma , COPD,  COPD inhaler, former smoker,           Cardiovascular hypertension, Pt. on medications + Peripheral Vascular Disease  + dysrhythmias Atrial Fibrillation + Valvular Problems/Murmurs MVP    Temporal arteritis Moderate pulmonary hypertension  '19 TTE -EF 60% to 65%. Grade 2 diastolic dysfunction. LA was moderately dilated. Mild-mod TR. PASP moderately increased, PA peak pressure: 47 mm Hg    Neuro/Psych  Headaches, Anxiety    GI/Hepatic Neg liver ROS,  IBS    Endo/Other  negative endocrine ROS  Renal/GU negative Renal ROS  negative genitourinary   Musculoskeletal  (+) Arthritis ,   Abdominal   Peds  Hematology negative hematology ROS (+)   Anesthesia Other Findings   Reproductive/Obstetrics                             Anesthesia Physical Anesthesia Plan  ASA: III  Anesthesia Plan: General   Post-op Pain Management:    Induction:   PONV Risk Score and Plan: 4 or greater  Airway Management Planned:   Additional Equipment:   Intra-op Plan:   Post-operative Plan:   Informed Consent:   Plan Discussed with: Surgeon  Anesthesia Plan Comments: (Discussed case with surgeon. A-fib currently with poor rate control, HR ranging 120-140 on floor despite increasing amiodarone dosing. Team planning to start IV digoxin today. Will postpone procedure until A-fib/HR under better control.)       Anesthesia Quick Evaluation

## 2018-03-20 NOTE — Progress Notes (Signed)
Heparin gtt discontinued for surgery today.

## 2018-03-20 NOTE — Progress Notes (Signed)
Anesthesia cancelled surgery today due to poor HR control 2/2 Afib with RVR. Plan for surgery when patient medically ready.

## 2018-03-20 NOTE — Progress Notes (Signed)
Progress Note  Patient Name: Crystal [name] Date of Encounter: 03/20/2018  Primary Cardiologist: [name], MD   Subjective   Pt denies CP   Breathing is OK    Inpatient Medications    Scheduled Meds: . calcium-vitamin D  1 tablet Oral Q breakfast  . cholecalciferol  3,000 Units Oral Q lunch  . gabapentin  500 mg Oral QHS  . LORazepam  1 mg Oral QHS  . mometasone-formoterol  2 puff Inhalation BID  . montelukast  10 mg Oral QHS  . povidone-iodine  2 application Topical Once  . predniSONE  7.5 mg Oral Q breakfast  . saccharomyces boulardii  250 mg Oral Daily  . theophylline  200 mg Oral BID   Continuous Infusions: . amiodarone 30 mg/hr (03/20/18 0622)  . heparin 700 Units/hr (03/20/18 0320)   PRN Meds: acetaminophen, fentaNYL (SUBLIMAZE) injection, ondansetron (ZOFRAN) IV, oxyCODONE-acetaminophen   Vital Signs    Vitals:   03/19/18 2047 03/19/18 2100 03/20/18 0000 03/20/18 0353  BP:  (!) 87/63 104/67 105/68  Pulse:    (!) 115  Resp:  12 11   Temp:   (!) 97.5 F (36.4 C)   TempSrc:   Oral   SpO2: 97% 95% 95% 100%  Weight:      Height:        Intake/Output Summary (Last 24 hours) at 03/20/2018 0807 Last data filed at 03/20/2018 0730 Gross per 24 hour  Intake 1588.73 ml  Output 2450 ml  Net -861.27 ml   Filed Weights   03/18/18 0627  Weight: 50.8 kg    Telemetry    afib with RVR   110s to 130  - Personally Reviewed  ECG      Physical Exam  Thin 72 yo in NAD   GEN: No acute distress.   Neck: No JVD Cardiac: Irreg irreg  no murmurs, rubs, or gallops.  Respiratory: Clear to auscultation bilaterally. GI: Soft, nontender, non-distended  MS: No edema; No deformity. Neuro:  Nonfocal  Psych: Normal affect   Labs    Chemistry Recent Labs  Lab 03/18/18 0652 03/20/18 0329  NA 137 134*  K 3.8 3.2*  CL 94* 96*  CO2 29 27  GLUCOSE 81 83  BUN 14 11  CREATININE 0.74 0.70  CALCIUM 9.8 8.7*  PROT 6.4*  --   ALBUMIN 4.0  --   AST 20  --     ALT 24  --   ALKPHOS 53  --   BILITOT 1.0  --   GFRNONAA >60 >60  GFRAA >60 >60  ANIONGAP 14 11     Hematology Recent Labs  Lab 03/18/18 0652 03/19/18 0505 03/20/18 0329  WBC 12.3* 15.6* 11.8*  RBC 4.00 3.65* 3.43*  HGB 14.3 13.2 12.3  HCT 43.1 40.4 37.8  MCV 107.8* 110.7* 110.2*  MCH 35.8* 36.2* 35.9*  MCHC 33.2 32.7 32.5  RDW 15.9* 16.8* 16.5*  PLT 289 247 228    Cardiac EnzymesNo results for input(s): TROPONINI in the last 168 hours. No results for input(s): TROPIPOC in the last 168 hours.   BNP Recent Labs  Lab 03/18/18 0652  BNP 534.0*     DDimer No results for input(s): DDIMER in the last 168 hours.   Radiology    Ct Head Wo Contrast  Result Date: 03/18/2018 CLINICAL DATA:  Fall, headache EXAM: CT HEAD WITHOUT CONTRAST CT CERVICAL SPINE WITHOUT CONTRAST TECHNIQUE: Multidetector CT imaging of the head and cervical spine was performed following the  standard protocol without intravenous contrast. Multiplanar CT image reconstructions of the cervical spine were also generated. COMPARISON:  11/27/2007 FINDINGS: CT HEAD FINDINGS Brain: Mild age related volume loss. No acute intracranial abnormality. Specifically, no hemorrhage, hydrocephalus, mass lesion, acute infarction, or significant intracranial injury. Vascular: No hyperdense vessel or unexpected calcification. Skull: No acute calvarial abnormality. Sinuses/Orbits: Visualized paranasal sinuses and mastoids clear. Orbital soft tissues unremarkable. Other: None CT CERVICAL SPINE FINDINGS Alignment: 3 mm of anterolisthesis of C4 on C5 related to facet disease. Skull base and vertebrae: No acute fracture. No primary bone lesion or focal pathologic process. Soft tissues and spinal canal: No prevertebral fluid or swelling. No visible canal hematoma. Disc levels: Degenerative disc and facet disease diffusely throughout the cervical spine. Disc disease is most pronounced at C5-6 and C6-7. Facet disease is most pronounced at  C3-4 and C4-5. Upper chest: No acute findings Other: No acute findings IMPRESSION: No acute intracranial abnormality. No acute bony abnormality in the cervical spine. Degenerative disc and facet disease. Electronically Signed   By: Rolm Baptise M.D.   On: 03/18/2018 08:53   Ct Cervical Spine Wo Contrast  Result Date: 03/18/2018 CLINICAL DATA:  Fall, headache EXAM: CT HEAD WITHOUT CONTRAST CT CERVICAL SPINE WITHOUT CONTRAST TECHNIQUE: Multidetector CT imaging of the head and cervical spine was performed following the standard protocol without intravenous contrast. Multiplanar CT image reconstructions of the cervical spine were also generated. COMPARISON:  11/27/2007 FINDINGS: CT HEAD FINDINGS Brain: Mild age related volume loss. No acute intracranial abnormality. Specifically, no hemorrhage, hydrocephalus, mass lesion, acute infarction, or significant intracranial injury. Vascular: No hyperdense vessel or unexpected calcification. Skull: No acute calvarial abnormality. Sinuses/Orbits: Visualized paranasal sinuses and mastoids clear. Orbital soft tissues unremarkable. Other: None CT CERVICAL SPINE FINDINGS Alignment: 3 mm of anterolisthesis of C4 on C5 related to facet disease. Skull base and vertebrae: No acute fracture. No primary bone lesion or focal pathologic process. Soft tissues and spinal canal: No prevertebral fluid or swelling. No visible canal hematoma. Disc levels: Degenerative disc and facet disease diffusely throughout the cervical spine. Disc disease is most pronounced at C5-6 and C6-7. Facet disease is most pronounced at C3-4 and C4-5. Upper chest: No acute findings Other: No acute findings IMPRESSION: No acute intracranial abnormality. No acute bony abnormality in the cervical spine. Degenerative disc and facet disease. Electronically Signed   By: Rolm Baptise M.D.   On: 03/18/2018 08:53   Ct Hip Right Wo Contrast  Result Date: 03/18/2018 CLINICAL DATA:  Right hip pain due to a fall on steps  today. Initial encounter. EXAM: CT OF THE RIGHT HIP WITHOUT CONTRAST TECHNIQUE: Multidetector CT imaging of the right hip was performed according to the standard protocol. Multiplanar CT image reconstructions were also generated. COMPARISON:  Plain films right hip this same day. FINDINGS: Bones/Joint/Cartilage Mildly impacted subcapital right hip fracture is identified as seen on the comparison plain films. No other fracture is seen. 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 Degenerative change about the symphysis pubis also noted. Ligaments Suboptimally assessed by CT. Muscles and Tendons Appear intact. Soft tissues Imaged intrapelvic contents demonstrate no acute abnormality. Atherosclerosis and diverticulosis noted. IMPRESSION: Mildly impacted subcapital fracture right hip. No other acute finding. Electronically Signed   By: Inge Rise M.D.   On: 03/18/2018 12:34   Dg Chest Port 1 View  Result Date: 03/18/2018 CLINICAL DATA:  Pt missed a step at home and fell onto her right side this AM; pt complains of lateral right  hip pain that radiates down right leg; hx of HTN, MVP, and A-fib; former smoker EXAM: PORTABLE CHEST - 1 VIEW COMPARISON:  09/07/2017 FINDINGS: Lungs are clear. Heart size and mediastinal contours are within normal limits. Aortic Atherosclerosis (ICD10-170.0). No effusion.  No pneumothorax. Visualized bones unremarkable. IMPRESSION: No acute cardiopulmonary disease. Electronically Signed   By: Lucrezia Europe M.D.   On: 03/18/2018 08:58   Dg Knee Right Port  Result Date: 03/18/2018 CLINICAL DATA:  Pt missed a step at home and fell onto her right side this AM; pt complains of lateral right hip pain that radiates down right leg EXAM: PORTABLE RIGHT KNEE - 1-2 VIEW COMPARISON:  None. FINDINGS: No evidence of fracture, dislocation, or joint effusion. Mild diffuse osteopenia. No evidence of arthropathy or other focal bone abnormality. Soft tissues are unremarkable. IMPRESSION: Negative.  Electronically Signed   By: Lucrezia Europe M.D.   On: 03/18/2018 08:59   Dg Hip Unilat With Pelvis 2-3 Views Right  Result Date: 03/18/2018 CLINICAL DATA:  Pt missed a step at home and fell onto her right side this AM; pt complains of lateral right hip pain that radiates down right leg EXAM: DG HIP (WITH OR WITHOUT PELVIS) 2-3V RIGHT COMPARISON:  None. FINDINGS: Mildly impacted, minimally comminuted mid cervical fracture of the proximal right femur. No significant angulation deformity. No dislocation. Pelvis intact. Mild spondylitic changes in the lower lumbar spine. IMPRESSION: 1. Impacted mid cervical fracture, proximal right femur. Electronically Signed   By: Lucrezia Europe M.D.   On: 03/18/2018 09:01    Cardiac Studies     Patient Profile     Crystal [name] a 72 y.o.femalewith past medical history of PAF (on Eliquis), giant cell arteritis (now legally blind), HTN, HLD, and PVD (known right SFA occlusion)who we were asked to see for new onset A. fib with RVR in the setting of a hip fracture and extreme pain on Eliquis oral anticoagulation at home.  She is scheduled for right hip orthopedic surgical procedure tomorrow by Dr. Lyla Glassing.  Assessment & Plan    1  PAF   Pt takes Eliquis at home   On admit afib with RVR  Amiodarone has been started   Rates are still high   I would increase rate to 60 mg per hour   Give IV digoxin today   Switch prob to po tomorrow  Follow BP   SBP around 90   Pt comfortable   May need to back down on rate   Continue heparin   2  Preop eval   Felt to be  low cardiac  risk for planned hip surgery  Continue with rate control     For questions or updates, please contact [facility] Please consult [email or web address] for contact info under        Signed, Dorris Carnes, MD  03/20/2018, 8:07 AM

## 2018-03-21 ENCOUNTER — Inpatient Hospital Stay (HOSPITAL_COMMUNITY): Payer: Medicare Other

## 2018-03-21 ENCOUNTER — Inpatient Hospital Stay (HOSPITAL_COMMUNITY): Payer: Medicare Other | Admitting: Certified Registered Nurse Anesthetist

## 2018-03-21 ENCOUNTER — Encounter (HOSPITAL_COMMUNITY)
Admission: EM | Disposition: A | Discharge status: Rehabilitation Facility | Payer: Self-pay | Source: Home / Self Care | Attending: Internal Medicine

## 2018-03-21 ENCOUNTER — Encounter (HOSPITAL_COMMUNITY): Payer: Self-pay | Admitting: Certified Registered Nurse Anesthetist

## 2018-03-21 DIAGNOSIS — I4891 Unspecified atrial fibrillation: Secondary | ICD-10-CM | POA: Diagnosis present

## 2018-03-21 HISTORY — PX: HIP PINNING,CANNULATED: SHX1758

## 2018-03-21 LAB — BASIC METABOLIC PANEL
Anion gap: 8 (ref 5–15)
BUN: 8 mg/dL (ref 8–23)
CO2: 26 mmol/L (ref 22–32)
Calcium: 8.6 mg/dL — ABNORMAL LOW (ref 8.9–10.3)
Chloride: 101 mmol/L (ref 98–111)
Creatinine, Ser: 0.62 mg/dL (ref 0.44–1.00)
GFR calc Af Amer: >60 mL/min (ref >60)
GFR calc non Af Amer: >60 mL/min (ref >60)
Glucose, Bld: 75 mg/dL (ref 70–99)
Potassium: 4.7 mmol/L (ref 3.5–5.1)
Sodium: 135 mmol/L (ref 135–145)

## 2018-03-21 LAB — CBC
HCT: 38.9 % (ref 36.0–46.0)
Hemoglobin: 12.4 g/dL (ref 12.0–15.0)
MCH: 35.6 pg — ABNORMAL HIGH (ref 26.0–34.0)
MCHC: 31.9 g/dL (ref 30.0–36.0)
MCV: 111.8 fL — ABNORMAL HIGH (ref 80.0–100.0)
Platelets: 211 10*3/uL (ref 150–400)
RBC: 3.48 MIL/uL — ABNORMAL LOW (ref 3.87–5.11)
RDW: 16.4 % — ABNORMAL HIGH (ref 11.5–15.5)
WBC: 11.1 10*3/uL — ABNORMAL HIGH (ref 4.0–10.5)

## 2018-03-21 LAB — TSH: TSH: 5.142 u[IU]/mL — ABNORMAL HIGH (ref 0.350–4.500)

## 2018-03-21 LAB — APTT: aPTT: 60 seconds — ABNORMAL HIGH (ref 24–36)

## 2018-03-21 LAB — MAGNESIUM: Magnesium: 2.2 mg/dL (ref 1.7–2.4)

## 2018-03-21 LAB — TYPE AND SCREEN
ABO/RH(D): O POS
Antibody Screen: NEGATIVE

## 2018-03-21 LAB — HEPARIN LEVEL (UNFRACTIONATED): Heparin Unfractionated: 0.25 IU/mL — ABNORMAL LOW (ref 0.30–0.70)

## 2018-03-21 SURGERY — FIXATION, FEMUR, NECK, PERCUTANEOUS, USING SCREW
Anesthesia: General | Laterality: Right

## 2018-03-21 MED ORDER — ESMOLOL HCL 100 MG/10ML IV SOLN
INTRAVENOUS | Status: AC
  Filled 2018-03-21: qty 10

## 2018-03-21 MED ORDER — 0.9 % SODIUM CHLORIDE (POUR BTL) OPTIME
TOPICAL | Status: DC | PRN
  Administered 2018-03-21: 1000 mL

## 2018-03-21 MED ORDER — CEFAZOLIN SODIUM-DEXTROSE 2-4 GM/100ML-% IV SOLN
2.0000 g | Freq: Four times a day (QID) | INTRAVENOUS | Status: AC
  Administered 2018-03-21 (×2): 2 g via INTRAVENOUS
  Filled 2018-03-21 (×3): qty 100

## 2018-03-21 MED ORDER — ACETAMINOPHEN 10 MG/ML IV SOLN
INTRAVENOUS | Status: AC
  Filled 2018-03-21: qty 100

## 2018-03-21 MED ORDER — HYDROMORPHONE HCL 1 MG/ML IJ SOLN
0.2500 mg | INTRAMUSCULAR | Status: DC | PRN
  Administered 2018-03-21 (×3): 0.5 mg via INTRAVENOUS

## 2018-03-21 MED ORDER — ACETAMINOPHEN 10 MG/ML IV SOLN
1000.0000 mg | Freq: Once | INTRAVENOUS | Status: DC | PRN
  Administered 2018-03-21: 1000 mg via INTRAVENOUS

## 2018-03-21 MED ORDER — DOCUSATE SODIUM 100 MG PO CAPS
100.0000 mg | ORAL_CAPSULE | Freq: Two times a day (BID) | ORAL | Status: DC
  Administered 2018-03-21 – 2018-03-23 (×4): 100 mg via ORAL
  Filled 2018-03-21 (×6): qty 1

## 2018-03-21 MED ORDER — PHENYLEPHRINE 40 MCG/ML (10ML) SYRINGE FOR IV PUSH (FOR BLOOD PRESSURE SUPPORT)
PREFILLED_SYRINGE | INTRAVENOUS | Status: DC | PRN
  Administered 2018-03-21: 120 ug via INTRAVENOUS
  Administered 2018-03-21: 40 ug via INTRAVENOUS
  Administered 2018-03-21 (×2): 120 ug via INTRAVENOUS

## 2018-03-21 MED ORDER — FENTANYL CITRATE (PF) 250 MCG/5ML IJ SOLN
INTRAMUSCULAR | Status: AC
  Filled 2018-03-21: qty 5

## 2018-03-21 MED ORDER — APIXABAN 2.5 MG PO TABS: 2.5000 mg | ORAL_TABLET | Freq: Two times a day (BID) | ORAL | Status: DC

## 2018-03-21 MED ORDER — SODIUM CHLORIDE 0.9 % IV SOLN
INTRAVENOUS | Status: DC | PRN
  Administered 2018-03-21: 25 ug/min via INTRAVENOUS

## 2018-03-21 MED ORDER — PHENOL 1.4 % MT LIQD: 1.0000 | OROMUCOSAL | Status: DC | PRN

## 2018-03-21 MED ORDER — ROCURONIUM BROMIDE 50 MG/5ML IV SOSY
PREFILLED_SYRINGE | INTRAVENOUS | Status: AC
  Filled 2018-03-21: qty 5

## 2018-03-21 MED ORDER — HYDROMORPHONE HCL 1 MG/ML IJ SOLN
INTRAMUSCULAR | Status: AC
  Filled 2018-03-21: qty 1

## 2018-03-21 MED ORDER — LACTATED RINGERS IV SOLN
INTRAVENOUS | Status: DC | PRN
  Administered 2018-03-21: 12:00:00 via INTRAVENOUS

## 2018-03-21 MED ORDER — DIGOXIN 0.25 MG/ML IJ SOLN
0.2500 mg | Freq: Every day | INTRAMUSCULAR | Status: DC
  Administered 2018-03-21: 0.25 mg via INTRAVENOUS
  Filled 2018-03-21 (×2): qty 1

## 2018-03-21 MED ORDER — CEFAZOLIN SODIUM-DEXTROSE 2-3 GM-%(50ML) IV SOLR
INTRAVENOUS | Status: DC | PRN
  Administered 2018-03-21: 2 g via INTRAVENOUS

## 2018-03-21 MED ORDER — HYDROCORTISONE NA SUCCINATE PF 100 MG IJ SOLR
INTRAMUSCULAR | Status: DC | PRN
  Administered 2018-03-21: 150 mg via INTRAVENOUS

## 2018-03-21 MED ORDER — ONDANSETRON HCL 4 MG PO TABS: 4.0000 mg | ORAL_TABLET | Freq: Four times a day (QID) | ORAL | Status: DC | PRN

## 2018-03-21 MED ORDER — ONDANSETRON HCL 4 MG/2ML IJ SOLN: 4.0000 mg | Freq: Four times a day (QID) | INTRAMUSCULAR | Status: DC | PRN

## 2018-03-21 MED ORDER — METOPROLOL TARTRATE 5 MG/5ML IV SOLN
5.0000 mg | Freq: Four times a day (QID) | INTRAVENOUS | Status: DC
  Administered 2018-03-21 (×3): 5 mg via INTRAVENOUS
  Filled 2018-03-21 (×3): qty 5

## 2018-03-21 MED ORDER — MEPERIDINE HCL 50 MG/ML IJ SOLN: 6.2500 mg | INTRAMUSCULAR | Status: DC | PRN

## 2018-03-21 MED ORDER — ONDANSETRON HCL 4 MG/2ML IJ SOLN
INTRAMUSCULAR | Status: DC | PRN
  Administered 2018-03-21: 4 mg via INTRAVENOUS

## 2018-03-21 MED ORDER — FENTANYL CITRATE (PF) 100 MCG/2ML IJ SOLN
INTRAMUSCULAR | Status: DC | PRN
  Administered 2018-03-21: 50 ug via INTRAVENOUS
  Administered 2018-03-21: 100 ug via INTRAVENOUS

## 2018-03-21 MED ORDER — PROPOFOL 10 MG/ML IV BOLUS
INTRAVENOUS | Status: DC | PRN
  Administered 2018-03-21: 100 mg via INTRAVENOUS

## 2018-03-21 MED ORDER — PROMETHAZINE HCL 25 MG/ML IJ SOLN: 6.2500 mg | INTRAMUSCULAR | Status: DC | PRN

## 2018-03-21 MED ORDER — HYDROCODONE-ACETAMINOPHEN 7.5-325 MG PO TABS: 1.0000 | ORAL_TABLET | Freq: Once | ORAL | Status: DC | PRN

## 2018-03-21 MED ORDER — ROCURONIUM BROMIDE 50 MG/5ML IV SOSY
PREFILLED_SYRINGE | INTRAVENOUS | Status: DC | PRN
  Administered 2018-03-21: 10 mg via INTRAVENOUS
  Administered 2018-03-21: 50 mg via INTRAVENOUS
  Administered 2018-03-21: 10 mg via INTRAVENOUS

## 2018-03-21 MED ORDER — SUGAMMADEX SODIUM 200 MG/2ML IV SOLN
INTRAVENOUS | Status: DC | PRN
  Administered 2018-03-21: 100 mg via INTRAVENOUS

## 2018-03-21 MED ORDER — ESMOLOL HCL 100 MG/10ML IV SOLN
INTRAVENOUS | Status: DC | PRN
  Administered 2018-03-21 (×2): 30 mg via INTRAVENOUS

## 2018-03-21 MED ORDER — HEPARIN (PORCINE) IN NACL 100-0.45 UNIT/ML-% IJ SOLN
800.0000 [IU]/h | INTRAMUSCULAR | Status: DC
  Administered 2018-03-21: 700 [IU]/h via INTRAVENOUS

## 2018-03-21 MED ORDER — CEFAZOLIN SODIUM-DEXTROSE 2-4 GM/100ML-% IV SOLN
INTRAVENOUS | Status: AC
  Filled 2018-03-21: qty 100

## 2018-03-21 MED ORDER — SENNA 8.6 MG PO TABS
1.0000 | ORAL_TABLET | Freq: Two times a day (BID) | ORAL | Status: DC
  Administered 2018-03-21 – 2018-03-22 (×2): 8.6 mg via ORAL
  Filled 2018-03-21 (×6): qty 1

## 2018-03-21 MED ORDER — LACTATED RINGERS IV SOLN: INTRAVENOUS | Status: DC

## 2018-03-21 MED ORDER — METOCLOPRAMIDE HCL 5 MG PO TABS: 5.0000 mg | ORAL_TABLET | Freq: Three times a day (TID) | ORAL | Status: DC | PRN

## 2018-03-21 MED ORDER — MENTHOL 3 MG MT LOZG
1.0000 | LOZENGE | OROMUCOSAL | Status: DC | PRN
  Filled 2018-03-21: qty 9

## 2018-03-21 MED ORDER — ONDANSETRON HCL 4 MG/2ML IJ SOLN
INTRAMUSCULAR | Status: AC
  Filled 2018-03-21: qty 2

## 2018-03-21 MED ORDER — METOCLOPRAMIDE HCL 5 MG/ML IJ SOLN: 5.0000 mg | Freq: Three times a day (TID) | INTRAMUSCULAR | Status: DC | PRN

## 2018-03-21 SURGICAL SUPPLY — 34 items
BIT DRILL 4.8X200 CANN (BIT) ×1 IMPLANT
CHLORAPREP W/TINT 26ML (MISCELLANEOUS) ×2 IMPLANT
COVER PERINEAL POST (MISCELLANEOUS) ×2 IMPLANT
COVER SURGICAL LIGHT HANDLE (MISCELLANEOUS) ×2 IMPLANT
COVER WAND RF STERILE (DRAPES) ×2 IMPLANT
DERMABOND ADVANCED (GAUZE/BANDAGES/DRESSINGS) ×1
DERMABOND ADVANCED .7 DNX12 (GAUZE/BANDAGES/DRESSINGS) ×1 IMPLANT
DRAPE C-ARM 42X72 X-RAY (DRAPES) ×2 IMPLANT
DRAPE C-ARMOR (DRAPES) ×2 IMPLANT
DRAPE IMP U-DRAPE 54X76 (DRAPES) ×4 IMPLANT
DRAPE STERI IOBAN 125X83 (DRAPES) ×2 IMPLANT
DRAPE U-SHAPE 47X51 STRL (DRAPES) ×4 IMPLANT
DRSG AQUACEL AG ADV 3.5X 4 (GAUZE/BANDAGES/DRESSINGS) ×2 IMPLANT
DRSG MEPILEX BORDER 4X4 (GAUZE/BANDAGES/DRESSINGS) ×1 IMPLANT
ELECT REM PT RETURN 9FT ADLT (ELECTROSURGICAL) ×2
ELECTRODE REM PT RTRN 9FT ADLT (ELECTROSURGICAL) ×1 IMPLANT
GLOVE BIO SURGEON STRL SZ8.5 (GLOVE) ×2 IMPLANT
GLOVE BIOGEL PI IND STRL 8.5 (GLOVE) ×1 IMPLANT
GLOVE BIOGEL PI INDICATOR 8.5 (GLOVE) ×1
GOWN STRL REUS W/ TWL LRG LVL3 (GOWN DISPOSABLE) ×1 IMPLANT
GOWN STRL REUS W/TWL 2XL LVL3 (GOWN DISPOSABLE) ×2 IMPLANT
GOWN STRL REUS W/TWL LRG LVL3 (GOWN DISPOSABLE) ×1
KIT TURNOVER KIT B (KITS) ×2 IMPLANT
MARKER SKIN DUAL TIP RULER LAB (MISCELLANEOUS) ×2 IMPLANT
NS IRRIG 1000ML POUR BTL (IV SOLUTION) ×2 IMPLANT
PACK GENERAL/GYN (CUSTOM PROCEDURE TRAY) ×2 IMPLANT
PAD ARMBOARD 7.5X6 YLW CONV (MISCELLANEOUS) ×4 IMPLANT
PIN GUIDE DRILL TIP 2.8X300 (DRILL) ×3 IMPLANT
SCREW 16MM THREAD 6.5X75MM (Screw) ×1 IMPLANT
SCREW 16MM THREAD 6.5X85MM (Screw) ×1 IMPLANT
SCREW CANN THRD 6.5X80 (Screw) ×1 IMPLANT
SUT MNCRL AB 3-0 PS2 27 (SUTURE) ×2 IMPLANT
SUT MON AB 2-0 CT1 27 (SUTURE) ×2 IMPLANT
TOWEL OR 17X26 10 PK STRL BLUE (TOWEL DISPOSABLE) ×2 IMPLANT

## 2018-03-21 NOTE — Anesthesia Procedure Notes (Signed)
Procedure Name: Intubation Date/Time: 03/21/2018 12:55 PM Performed by: Candis Shine, CRNA Pre-anesthesia Checklist: Patient identified, Emergency Drugs available, Suction available and Patient being monitored Patient Re-evaluated:Patient Re-evaluated prior to induction Oxygen Delivery Method: Circle System Utilized Preoxygenation: Pre-oxygenation with 100% oxygen Induction Type: IV induction Ventilation: Mask ventilation without difficulty Laryngoscope Size: Mac and 3 Grade View: Grade I Tube type: Oral Tube size: 7.0 mm Number of attempts: 1 Airway Equipment and Method: Stylet Placement Confirmation: ETT inserted through vocal cords under direct vision,  positive ETCO2 and breath sounds checked- equal and bilateral Secured at: 22 cm Tube secured with: Tape Dental Injury: Teeth and Oropharynx as per pre-operative assessment

## 2018-03-21 NOTE — Transfer of Care (Signed)
Immediate Anesthesia Transfer of Care Note  Patient: Crystal [name]  Procedure(s) Performed: CANNULATED HIP PINNING (Right )  Patient Location: PACU  Anesthesia Type:General  Level of Consciousness: awake, alert  and oriented  Airway & Oxygen Therapy: Patient Spontanous Breathing and Patient connected to nasal cannula oxygen  Post-op Assessment: Report given to RN and Post -op Vital signs reviewed and stable  Post vital signs: Reviewed and stable  Last Vitals:  Vitals Value Taken Time  BP 128/71 03/21/2018  2:19 PM  Temp    Pulse 115 03/21/2018  2:25 PM  Resp 11 03/21/2018  2:25 PM  SpO2 90 % 03/21/2018  2:25 PM  Vitals shown include unvalidated device data.  Last Pain:  Vitals:   03/21/18 1045  TempSrc:   PainSc: 8       Patients Stated Pain Goal: 1 (43/60/67 7034)  Complications: No apparent anesthesia complications

## 2018-03-21 NOTE — Interval H&P Note (Signed)
History and Physical Interval Note:  03/21/2018 12:34 PM  Crystal [name]  has presented today for surgery, with the diagnosis of Right Fractured Hip  The various methods of treatment have been discussed with the patient and family. After consideration of risks, benefits and other options for treatment, the patient has consented to  Procedure(s): CANNULATED HIP PINNING (Right) as a surgical intervention .  The patient's history has been reviewed, patient examined, no change in status, stable for surgery.  I have reviewed the patient's chart and labs.  Questions were answered to the patient's satisfaction.    Heparin gtt stopped @ 0700. Has (+) ecchymosis to lateral right thigh.  The risks, benefits, and alternatives were discussed with the patient. There are risks associated with the surgery including, but not limited to, problems with anesthesia (death), infection, differences in leg length/angulation/rotation, fracture of bones, loosening or failure of implants, malunion, nonunion, hematoma (blood accumulation) which may require surgical drainage, blood clots, pulmonary embolism, nerve injury (foot drop), and blood vessel injury. The patient understands these risks and elects to proceed.    Hilton Cork Tenise Stetler

## 2018-03-21 NOTE — Progress Notes (Signed)
ANTICOAGULATION CONSULT NOTE - Follow-up Consult  Pharmacy Consult:  Heparin  Indication: atrial fibrillation  Allergies  Allergen Reactions  . Simvastatin     myalgias    Patient Measurements: Height: 5\' 3"  (160 cm) Weight: 112 lb (50.8 kg) IBW/kg (Calculated) : 52.4 Heparin Dosing Weight: 50 kg  Vital Signs: Temp: 98.2 F (36.8 C) (10/08 0400) Temp Source: Oral (10/08 0400) BP: 96/74 (10/08 0400) Pulse Rate: 112 (10/07 2345)  Labs: Recent Labs    03/18/18 2080  03/19/18 0505 03/20/18 0329 03/21/18 0227  HGB 14.3  --  13.2 12.3 12.4  HCT 43.1  --  40.4 37.8 38.9  PLT 289  --  247 228 211  APTT  --    < > 90* 77* 60*  LABPROT 12.2  --   --   --   --   INR 0.91  --   --   --   --   HEPARINUNFRC  --    < > 0.78* 0.56 0.25*  CREATININE 0.74  --   --  0.70  --    < > = values in this interval not displayed.    Estimated Creatinine Clearance: 49.5 mL/min (by C-G formula based on SCr of 0.7 mg/dL).   Assessment: 39 YOF presented with right hip fracture s/p fall on 03/17/18.  She has a history of AFib on Eliquis PTA, last dose 03/17/18 at 2200.  Pharmacy consulted to transition patient to IV heparin in anticipation of surgery.   Heparin order d/c 10/7 as pt was supposed to go to surgery but surgery was cancelled and heparin gtt was not stopped so RN called to pharmacy and d/w MD and order to continue heparin gtt.   Heparin level (0.25) subtherapeutic and PTT subtherapeutic. Apixaban effects have worn off so will d/c PTT and utilize heparin level for monitoring.  Goal of Therapy:  Heparin level 0.3-0.7 units/ml; PTT 66-102 sec Monitor platelets by anticoagulation protocol: Yes    Plan:  Increase heparin to 800 units/hr Will f/u 8 hr heparin level D/c PTT  Sherlon Handing, PharmD, BCPS Clinical pharmacist  **Pharmacist phone directory can now be found on [email or web address] ([email or web address]).  Listed under [place]. 03/21/2018

## 2018-03-21 NOTE — Progress Notes (Signed)
Pt received from PACU. C/A/Ox4. Vitals stable. CCMD notified. Pur wick applied. Pt denies complaints at this time. Will continue to monitor. Jerald Kief, RN

## 2018-03-21 NOTE — Progress Notes (Signed)
Pt HR has been fluctuating between 90-130's through out the night. HR started going up roughly after 2130 after pt was made aware she is NPO after Midnight and might go for surgery in am. Pt has no s/s of distress. Prn medication for pain, and anxiety  administered. Amiodarone drip running @33 .3 ml/hr. Call light within reach. Pt on 2l o2 via [place]. Will continue to monitor.

## 2018-03-21 NOTE — Progress Notes (Signed)
[facility] TEAM 1 - Stepdown/ICU TEAM  SAUDIA SMYSER  OAC:[id number] DOB: 05/31/1946 DOA: 03/18/2018 PCP: [name], MD    Brief Narrative:  72 year old female w/ a hx of paroxysmal atrial fibrillation, giant cell arteritis, hypertension, dyslipidemia, and peripheral vascular disease who presented after a mechanical fall (missed a step). She was found to have an acute right femoral fracture complicated with atrial flutter/ fibrillation with RVR.  Subjective: Pt has been busy in the OR and recovery today, on multiple attempts for a f/u visit. She was seen by Cardiology earlier today.    Assessment & Plan:  Atrial fibrillation with rapid ventricular response On chronic Eliquis - using IV heparin periop - IV amio per Cards w/ digoxin being added due to difficult to control RVR - IV BB added today    Right hip fracture To OR today for surgical correction - care per Orthopedics   Hypokalemia Corrected to goal of 4.0  Hypomagnesemia   Corrected to goal of 2.0  HTN BP controlled   Giant cell arteritis Continue prednisone 7.5mg  QD  COPD Stable   DVT prophylaxis: IV heparin  Code Status: FULL CODE Family Communication:   Disposition Plan:    Consultants:  Cardiology  Orthopedics   Antimicrobials:  none  Objective: Blood pressure 125/73, pulse (!) 137, temperature 98.5 F (36.9 C), temperature source Other (Comment), resp. rate 15, height 5\' 3"  (1.6 m), weight 50 kg, SpO2 95 %.  Intake/Output Summary (Last 24 hours) at 03/21/2018 1429 Last data filed at 03/21/2018 1426 Gross per 24 hour  Intake 2394.06 ml  Output 1500 ml  Net 894.06 ml   Filed Weights   03/18/18 0627 03/21/18 1117  Weight: 50.8 kg 50 kg    Examination: No exam today as pt has been in OR/PACU on multiple attempts to visit  = no charge today from [facility]  CBC: Recent Labs  Lab 03/18/18 0652 03/19/18 0505 03/20/18 0329 03/21/18 0227  WBC 12.3* 15.6* 11.8* 11.1*  NEUTROABS 8.8*  --   --   --     HGB 14.3 13.2 12.3 12.4  HCT 43.1 40.4 37.8 38.9  MCV 107.8* 110.7* 110.2* 111.8*  PLT 289 247 228 010   Basic Metabolic Panel: Recent Labs  Lab 03/18/18 0652 03/18/18 1432 03/20/18 0329 03/21/18 0227  NA 137  --  134* 135  K 3.8  --  3.2* 4.7  CL 94*  --  96* 101  CO2 29  --  27 26  GLUCOSE 81  --  83 75  BUN 14  --  11 8  CREATININE 0.74  --  0.70 0.62  CALCIUM 9.8  --  8.7* 8.6*  MG 1.9 1.7  --  2.2   GFR: Estimated Creatinine Clearance: 48.7 mL/min (by C-G formula based on SCr of 0.62 mg/dL).  Liver Function Tests: Recent Labs  Lab 03/18/18 0652  AST 20  ALT 24  ALKPHOS 53  BILITOT 1.0  PROT 6.4*  ALBUMIN 4.0    Coagulation Profile: Recent Labs  Lab 03/18/18 0652  INR 0.91     Recent Results (from the past 240 hour(s))  MRSA PCR Screening     Status: None   Collection Time: 03/19/18  4:35 AM  Result Value Ref Range Status   MRSA by PCR NEGATIVE NEGATIVE Final    Comment:        The GeneXpert MRSA Assay (FDA approved for NASAL specimens only), is one component of a comprehensive MRSA colonization surveillance  program. It is not intended to diagnose MRSA infection nor to guide or monitor treatment for MRSA infections. Performed at [facility], [place] [address], [place], [place] [address]      Scheduled Meds: . [MAR Hold] calcium-vitamin D  1 tablet Oral Q breakfast  . [MAR Hold] cholecalciferol  3,000 Units Oral Q lunch  . [MAR Hold] digoxin  0.25 mg Intravenous Daily  . [MAR Hold] gabapentin  500 mg Oral QHS  . [MAR Hold] LORazepam  1 mg Oral QHS  . [MAR Hold] metoprolol tartrate  5 mg Intravenous QID  . [MAR Hold] mometasone-formoterol  2 puff Inhalation BID  . [MAR Hold] montelukast  10 mg Oral QHS  . povidone-iodine  2 application Topical Once  . [MAR Hold] predniSONE  7.5 mg Oral Q breakfast  . [MAR Hold] saccharomyces boulardii  250 mg Oral Daily   Continuous Infusions: . amiodarone 60 mg/hr (03/21/18 1246)  . heparin  Stopped (03/21/18 0746)  . lactated ringers       LOS: 3 days   Cherene Altes, MD [facility] Office  [phone] Pager - Text Page per Amion  If 7PM-7AM, please contact night-coverage per Amion 03/21/2018, 2:29 PM

## 2018-03-21 NOTE — Progress Notes (Addendum)
Progress Note  Patient Name: Crystal [name] Date of Encounter: 03/21/2018  Primary Cardiologist: [name], MD   Subjective   Pt denies CP   Breathing is OK    Inpatient Medications    Scheduled Meds: . calcium-vitamin D  1 tablet Oral Q breakfast  . cholecalciferol  3,000 Units Oral Q lunch  . digoxin  0.25 mg Intravenous Daily  . gabapentin  500 mg Oral QHS  . LORazepam  1 mg Oral QHS  . mometasone-formoterol  2 puff Inhalation BID  . montelukast  10 mg Oral QHS  . povidone-iodine  2 application Topical Once  . predniSONE  7.5 mg Oral Q breakfast  . saccharomyces boulardii  250 mg Oral Daily  . theophylline  200 mg Oral BID   Continuous Infusions: . amiodarone 60 mg/hr (03/21/18 0251)  . heparin 800 Units/hr (03/21/18 0415)   PRN Meds: acetaminophen, fentaNYL (SUBLIMAZE) injection, ondansetron (ZOFRAN) IV, oxyCODONE, traMADol   Vital Signs    Vitals:   03/21/18 0400 03/21/18 0425 03/21/18 0807 03/21/18 0808  BP: 96/74 108/60  125/73  Pulse: (!) 127 (!) 137  (!) 137  Resp: 20 17 20 15   Temp: 98.2 F (36.8 C)   98.5 F (36.9 C)  TempSrc: Oral   Other (Comment)  SpO2: 98% 96% 94% 95%  Weight:      Height:        Intake/Output Summary (Last 24 hours) at 03/21/2018 0813 Last data filed at 03/21/2018 0400 Gross per 24 hour  Intake 1694.06 ml  Output 1450 ml  Net 244.06 ml   Filed Weights   03/18/18 0627  Weight: 50.8 kg    Telemetry    afib with RVR   110s to 130  - Personally Reviewed  ECG      Physical Exam  Thin 72 yo in NAD   GEN: No acute distress.   Neck: JVP is normal    Cardiac: Irreg irreg  no murmurs, rubs, or gallops.  Respiratory: Clear to auscultation bilaterally. GI: Soft, nontender, non-distended  MS: No edema; No deformity. Neuro:  Nonfocal  Psych: Normal affect   Labs    Chemistry Recent Labs  Lab 03/18/18 0652 03/20/18 0329 03/21/18 0227  NA 137 134* 135  K 3.8 3.2* 4.7  CL 94* 96* 101  CO2 29 27 26   GLUCOSE  81 83 75  BUN 14 11 8   CREATININE 0.74 0.70 0.62  CALCIUM 9.8 8.7* 8.6*  PROT 6.4*  --   --   ALBUMIN 4.0  --   --   AST 20  --   --   ALT 24  --   --   ALKPHOS 53  --   --   BILITOT 1.0  --   --   GFRNONAA >60 >60 >60  GFRAA >60 >60 >60  ANIONGAP 14 11 8      Hematology Recent Labs  Lab 03/19/18 0505 03/20/18 0329 03/21/18 0227  WBC 15.6* 11.8* 11.1*  RBC 3.65* 3.43* 3.48*  HGB 13.2 12.3 12.4  HCT 40.4 37.8 38.9  MCV 110.7* 110.2* 111.8*  MCH 36.2* 35.9* 35.6*  MCHC 32.7 32.5 31.9  RDW 16.8* 16.5* 16.4*  PLT 247 228 211    Cardiac EnzymesNo results for input(s): TROPONINI in the last 168 hours. No results for input(s): TROPIPOC in the last 168 hours.   BNP Recent Labs  Lab 03/18/18 0652  BNP 534.0*     DDimer No results for input(s): DDIMER in  the last 168 hours.   Radiology    No results found.  Cardiac Studies     Patient Profile     Crystal [name] a 72 y.o.femalewith past medical history of PAF (on Eliquis), giant cell arteritis (now legally blind), HTN, HLD, and PVD (known right SFA occlusion)who we were asked to see for new onset A. fib with RVR in the setting of a hip fracture and extreme pain on Eliquis oral anticoagulation at home.  She is scheduled for right hip orthopedic surgical procedure tomorrow by Dr. Lyla Glassing.  Assessment & Plan    1  PAF   Pt takes Eliquis at home   On admit afib with RVR  Amiodarone has been started Digoxin added    Rates remain high   Will add metoprolol IV   Follow BP  Hopefully with amio, rate with slow    Hold theophylline   Check level Check TSH    2  Preop eval   Felt to be  low cardiac  risk for planned hip surgery  Continue with rate control     For questions or updates, please contact [facility] Please consult [email or web address] for contact info under        Signed, Dorris Carnes, MD  03/21/2018, 8:13 AM

## 2018-03-21 NOTE — Op Note (Signed)
OPERATIVE REPORT  SURGEON: Rod Can, MD   ASSISTANT: Ky Barban, RNFA.  PREOPERATIVE DIAGNOSIS: Right femoral neck fracture.   POSTOPERATIVE DIAGNOSIS: Right femoral neck fracture.   PROCEDURE: Percutaneous screw fixation, Right femoral neck.   IMPLANTS: Biomet 6.5 mm cannulated screws 3.  ANESTHESIA:  General  ESTIMATED BLOOD LOSS:-50 mL    ANTIBIOTICS: 2 g Ancef.  DRAINS: None.  COMPLICATIONS: None.   CONDITION: PACU - hemodynamically stable.   BRIEF CLINICAL NOTE: Crystal [name] is a 72 y.o. female who presented with a femoral neck fracture. The patient was admitted to the hospitalist service and underwent perioperative risk stratification and medical optimization. The risks, benefits, and alternatives to the procedure were explained, and the patient elected to proceed.  PROCEDURE IN DETAIL: Surgical site was marked by myself. The patient was taken to the operating room and anesthesia was induced on the bed. The patient was then transferred to the [facility] table and the nonoperative lower extremity was scissored underneath the operative side. The hip was prepped and draped in the normal sterile surgical fashion. Timeout was called verifying side and site of surgery. Preop antibiotics were given with 60 minutes of beginning the procedure.  Fluoroscopy was used to define the patient's anatomy. A 2 cm incision was made over the lateral aspect of the proximal femur. Guidepin was placed inferiorly on the AP x-ray, and centrally on the lateral x-ray. 2 additional guide pins were placed proximal to the first pin, one anterior and one posterior. Pin position was confirmed with biplanar fluoroscopy. The pins were sequentially measured, the near cortex was drilled, and the screws were placed. Final AP and lateral fluoroscopy views were obtained to confirm fracture reduction and hardware placement. There was no chondral penetration.  The wounds were copiously irrigated with saline. The  wound was closed in layers with #1 Vicryl for the fascia, 2-0 Monocryl for the deep dermal layer, and 3-0 Monocryl subcuticular stitch. Glue was applied to the skin. Once the glue was fully hardened, sterile dressing was applied. The patient was then awakened from anesthesia and taken to the PACU in stable condition. Sponge needle and instrument counts were correct at the end of the case 2. There were no known complications.  We will readmit the patient to the hospitalist. Weightbearing status will be weightbearing as tolerated with a walker. We will resume Eliquis at 2.5 mg PO BID for 72 hours, then resume home dose. The patient will work with physical therapy and undergo disposition planning.

## 2018-03-21 NOTE — Anesthesia Postprocedure Evaluation (Signed)
Anesthesia Post Note  Patient: Crystal [name]  Procedure(s) Performed: CANNULATED HIP PINNING (Right )     Patient location during evaluation: PACU Anesthesia Type: General Level of consciousness: awake and alert Pain management: pain level controlled Vital Signs Assessment: post-procedure vital signs reviewed and stable Respiratory status: spontaneous breathing, nonlabored ventilation, respiratory function stable and patient connected to nasal cannula oxygen Cardiovascular status: blood pressure returned to baseline and stable Postop Assessment: no apparent nausea or vomiting Anesthetic complications: no    Last Vitals:  Vitals:   03/21/18 2019 03/21/18 2045  BP:    Pulse:    Resp:    Temp: 36.7 C   SpO2: 96% 97%    Last Pain:  Vitals:   03/21/18 2035  TempSrc:   PainSc: 9                  Kalista Laguardia P Peyson Postema

## 2018-03-21 NOTE — Anesthesia Preprocedure Evaluation (Addendum)
Anesthesia Evaluation  Patient identified by MRN, date of birth, ID band Patient awake    Reviewed: Allergy & Precautions, H&P , NPO status , Patient's Chart, lab work & pertinent test results  Airway Mallampati: II  TM Distance: >3 FB Neck ROM: Full    Dental no notable dental hx. (+) Teeth Intact, Dental Advisory Given   Pulmonary asthma , former smoker,    Pulmonary exam normal breath sounds clear to auscultation       Cardiovascular Exercise Tolerance: Good hypertension, Pt. on medications + Peripheral Vascular Disease  + dysrhythmias Atrial Fibrillation  Rhythm:Regular Rate:Normal  Echo 09/09/2017 Left ventricle: The cavity size was normal. Wall thickness was   normal. Systolic function was normal. The estimated ejection   fraction was in the range of 60% to 65%. Wall motion was normal;   there were no regional wall motion abnormalities. Features are   consistent with a pseudonormal left ventricular filling pattern,   with concomitant abnormal relaxation and increased filling   pressure (grade 2 diastolic dysfunction).   Neuro/Psych Anxiety    GI/Hepatic negative GI ROS, Neg liver ROS,   Endo/Other    Renal/GU negative Renal ROS     Musculoskeletal  (+) Arthritis ,   Abdominal (+) - obese,   Peds  Hematology negative hematology ROS (+)   Anesthesia Other Findings   Reproductive/Obstetrics                            Lab Results  Component Value Date   CREATININE 0.62 03/21/2018   BUN 8 03/21/2018   NA 135 03/21/2018   K 4.7 03/21/2018   CL 101 03/21/2018   CO2 26 03/21/2018    Lab Results  Component Value Date   WBC 11.1 (H) 03/21/2018   HGB 12.4 03/21/2018   HCT 38.9 03/21/2018   MCV 111.8 (H) 03/21/2018   PLT 211 03/21/2018     Anesthesia Physical Anesthesia Plan  ASA: III  Anesthesia Plan: General   Post-op Pain Management:    Induction: Intravenous  PONV  Risk Score and Plan: 3 and Treatment may vary due to age or medical condition, Ondansetron and Dexamethasone  Airway Management Planned: Oral ETT  Additional Equipment:   Intra-op Plan:   Post-operative Plan: Extubation in OR  Informed Consent: I have reviewed the patients History and Physical, chart, labs and discussed the procedure including the risks, benefits and alternatives for the proposed anesthesia with the patient or authorized representative who has indicated his/her understanding and acceptance.   Dental advisory given  Plan Discussed with: CRNA  Anesthesia Plan Comments: (Stress dose steroids)       Anesthesia Quick Evaluation

## 2018-03-21 NOTE — Progress Notes (Signed)
Dr Lyla Glassing advises stop heparin. Pt may go for surgery this afternoon. Monitoring heart rate. Pt is NPO. Jerald Kief

## 2018-03-22 ENCOUNTER — Encounter (HOSPITAL_COMMUNITY): Payer: Self-pay | Admitting: General Practice

## 2018-03-22 ENCOUNTER — Other Ambulatory Visit: Payer: Self-pay

## 2018-03-22 DIAGNOSIS — S72001A Fracture of unspecified part of neck of right femur, initial encounter for closed fracture: Secondary | ICD-10-CM | POA: Diagnosis present

## 2018-03-22 LAB — FOLATE: Folate: 8 ng/mL (ref >5.9)

## 2018-03-22 LAB — BASIC METABOLIC PANEL
Anion gap: 12 (ref 5–15)
Anion gap: 11 (ref 5–15)
BUN: 8 mg/dL (ref 8–23)
BUN: 5 mg/dL — ABNORMAL LOW (ref 8–23)
CO2: 17 mmol/L — ABNORMAL LOW (ref 22–32)
CO2: 24 mmol/L (ref 22–32)
Calcium: 8.6 mg/dL — ABNORMAL LOW (ref 8.9–10.3)
Calcium: 8.7 mg/dL — ABNORMAL LOW (ref 8.9–10.3)
Chloride: 105 mmol/L (ref 98–111)
Chloride: 101 mmol/L (ref 98–111)
Creatinine, Ser: 0.64 mg/dL (ref 0.44–1.00)
Creatinine, Ser: 0.7 mg/dL (ref 0.44–1.00)
GFR calc Af Amer: >60 mL/min (ref >60)
GFR calc Af Amer: >60 mL/min (ref >60)
GFR calc non Af Amer: >60 mL/min (ref >60)
GFR calc non Af Amer: >60 mL/min (ref >60)
Glucose, Bld: 81 mg/dL (ref 70–99)
Glucose, Bld: 98 mg/dL (ref 70–99)
Potassium: 6.4 mmol/L (ref 3.5–5.1)
Potassium: 4.4 mmol/L (ref 3.5–5.1)
Sodium: 134 mmol/L — ABNORMAL LOW (ref 135–145)
Sodium: 136 mmol/L (ref 135–145)

## 2018-03-22 LAB — MAGNESIUM: Magnesium: 2.2 mg/dL (ref 1.7–2.4)

## 2018-03-22 LAB — T4, FREE: Free T4: 1.04 ng/dL (ref 0.82–1.77)

## 2018-03-22 LAB — VITAMIN B12: Vitamin B-12: 265 pg/mL (ref 180–914)

## 2018-03-22 LAB — THEOPHYLLINE LEVEL: Theophylline Lvl: 14.2 ug/mL (ref 10.0–20.0)

## 2018-03-22 MED ORDER — METOPROLOL TARTRATE 25 MG PO TABS
25.0000 mg | ORAL_TABLET | Freq: Three times a day (TID) | ORAL | Status: DC
  Administered 2018-03-22 (×3): 25 mg via ORAL
  Filled 2018-03-22 (×3): qty 1

## 2018-03-22 MED ORDER — APIXABAN 5 MG PO TABS
5.0000 mg | ORAL_TABLET | Freq: Two times a day (BID) | ORAL | Status: DC
  Administered 2018-03-22 – 2018-03-24 (×5): 5 mg via ORAL
  Filled 2018-03-22 (×5): qty 1

## 2018-03-22 MED ORDER — ALBUTEROL SULFATE (2.5 MG/3ML) 0.083% IN NEBU
INHALATION_SOLUTION | RESPIRATORY_TRACT | Status: AC
  Filled 2018-03-22: qty 3

## 2018-03-22 MED ORDER — ALBUTEROL SULFATE (2.5 MG/3ML) 0.083% IN NEBU: 2.5000 mg | INHALATION_SOLUTION | Freq: Four times a day (QID) | RESPIRATORY_TRACT | Status: DC | PRN

## 2018-03-22 MED ORDER — AMIODARONE HCL 200 MG PO TABS
200.0000 mg | ORAL_TABLET | Freq: Two times a day (BID) | ORAL | Status: DC
  Administered 2018-03-22 – 2018-03-24 (×5): 200 mg via ORAL
  Filled 2018-03-22 (×5): qty 1

## 2018-03-22 MED ORDER — ALBUTEROL SULFATE (2.5 MG/3ML) 0.083% IN NEBU: 2.5000 mg | INHALATION_SOLUTION | Freq: Four times a day (QID) | RESPIRATORY_TRACT | Status: DC

## 2018-03-22 NOTE — Evaluation (Signed)
Physical Therapy Evaluation Patient Details Name: Crystal [name] MRN: [id number] DOB: 1945/09/29 Today's Date: 03/22/2018   History of Present Illness  72 yo female presenting after mechanical fall at home and sustained right femoral neck fx. S/p percutaneous screw fixation and WBAT. 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   Clinical Impression  Patient presents with decreased independence with mobility due to weakness, decreased balance, pain and limited activity tolerance.  Patient is currently min to mod A for short distance ambulation and was independent for mobility with cane for vision prior to fall.  She will benefit from skilled PT in the acute setting to allow return home with family support following CIR level rehab stay.    Follow Up Recommendations CIR    Equipment Recommendations  Rolling walker with 5" wheels    Recommendations for Other Services Rehab consult     Precautions / Restrictions Precautions Precautions: Fall Precaution Comments: Legally blind Restrictions RLE Weight Bearing: Weight bearing as tolerated      Mobility  Bed Mobility Overal bed mobility: Needs Assistance Bed Mobility: Supine to Sit     Supine to sit: Mod assist     General bed mobility comments: assist for scooting and lifting trunk with cues for technique  Transfers Overall transfer level: Needs assistance Equipment used: Rolling walker (2 wheeled) Transfers: Sit to/from [facility] Sit to Stand: Mod assist Stand pivot transfers: Min assist       General transfer comment: some lifting assist with mod cues for technique from EOB and from 3:1; pivot to 3:1 with RW, cues and assist for balance  Ambulation/Gait Ambulation/Gait assistance: Min assist;+2 safety/equipment Gait Distance (Feet): 26 Feet Assistive device: Rolling walker (2 wheeled) Gait Pattern/deviations: Step-to pattern;Decreased stride  length;Antalgic     General Gait Details: limited tolerance to weight on R LE, cues and assist for walker management and positioning, son and daughter present and engaged to assist as well  Stairs            Wheelchair Mobility    Modified Rankin (Stroke Patients Only)       Balance Overall balance assessment: Needs assistance   Sitting balance-Leahy Scale: Fair     Standing balance support: Bilateral upper extremity supported Standing balance-Leahy Scale: Poor Standing balance comment: reliant on UE support                             Pertinent Vitals/Pain Faces Pain Scale: Hurts even more Pain Location: RLE Pain Descriptors / Indicators: Constant;Discomfort;Operative site guarding;Grimacing Pain Intervention(s): Repositioned;Monitored during session;Premedicated before session    [place] expects to be discharged to:: Private residence Living Arrangements: Spouse/significant other Available Help at Discharge: Family;Available 24 hours/day Type of Home: House Home Access: Stairs to enter Entrance Stairs-Rails: Left Entrance Stairs-Number of Steps: 2 steeper steps in garage with rail, 2-3 smaller steps no rail in front Home Layout: Able to live on main level with bedroom/bathroom;Multi-level Home Equipment: Other (comment)(white cane)      Prior Function Level of Independence: Independent         Comments: Pt was performing ADLs independently. Husband assisting with IADLs. Recently started OT to adapt and modify home environment for vision loss.     Hand Dominance   Dominant Hand: Right    Extremity/Trunk Assessment   Upper Extremity Assessment Upper Extremity Assessment: Defer to OT evaluation    Lower Extremity  Assessment Lower Extremity Assessment: RLE deficits/detail RLE Deficits / Details: AAROM limited by pain for hip and knee flexion with edema noted, able to flex ankle WFL and knee extension strength 3-/5        Communication   Communication: No difficulties  Cognition Arousal/Alertness: Awake/alert Behavior During Therapy: WFL for tasks assessed/performed Overall Cognitive Status: Within Functional Limits for tasks assessed                                        General Comments General comments (skin integrity, edema, etc.): Spouse walks with cane and significant gait abnormality, daughter, a PT, just s/p ACDF.    Exercises Total Joint Exercises Ankle Circles/Pumps: AROM;Both;5 reps;Supine Short Arc Quad: AROM;Both;5 reps;Supine Heel Slides: AROM;AAROM;Both;5 reps;Supine   Assessment/Plan    PT Assessment Patient needs continued PT services  PT Problem List Decreased strength;Decreased mobility;Decreased safety awareness;Decreased activity tolerance;Decreased balance;Decreased knowledge of use of DME;Pain       PT Treatment Interventions DME instruction;Therapeutic activities;Gait training;Therapeutic exercise;Patient/family education;Balance training;Stair training;Functional mobility training    PT Goals (Current goals can be found in the Care Plan section)  Acute Rehab PT Goals Patient Stated Goal: to get stronger PT Goal Formulation: With patient/family Time For Goal Achievement: 04/05/18 Potential to Achieve Goals: Good    Frequency Min 4X/week   Barriers to discharge        Co-evaluation               AM-PAC PT "6 Clicks" Daily Activity  Outcome Measure Difficulty turning over in bed (including adjusting bedclothes, sheets and blankets)?: Unable Difficulty moving from lying on back to sitting on the side of the bed? : Unable Difficulty sitting down on and standing up from a chair with arms (e.g., wheelchair, bedside commode, etc,.)?: Unable Help needed moving to and from a bed to chair (including a wheelchair)?: A Lot Help needed walking in hospital room?: A Little Help needed climbing 3-5 steps with a railing? : Total 6 Click Score:  9    End of Session Equipment Utilized During Treatment: Gait belt Activity Tolerance: Patient limited by fatigue Patient left: in bed;with call bell/phone within reach;with family/visitor present   PT Visit Diagnosis: History of falling (Z91.81);Muscle weakness (generalized) (M62.81);Difficulty in walking, not elsewhere classified (R26.2)    Time: 3244-0102 PT Time Calculation (min) (ACUTE ONLY): 36 min   Charges:   PT Evaluation $PT Eval High Complexity: 1 High PT Treatments $Gait Training: 8-22 mins        Magda Kiel, [place] [facility] [phone] 03/22/2018   Crystal [name] 03/22/2018, 4:43 PM

## 2018-03-22 NOTE — Progress Notes (Signed)
Patient c/o irritation in her throat and wants her ventolin inhaler, [facility] MD paged, verbal order received for ventolin inhalers 1-2 puff Q6 hours PRN.

## 2018-03-22 NOTE — Progress Notes (Signed)
[place] TEAM 1 - Stepdown/ICU TEAM  CAMILLIA MARCY  TKW:[id number] DOB: 1946-05-10 DOA: 03/18/2018 PCP: [name], MD    Brief Narrative:  72 year old female w/ a hx of paroxysmal atrial fibrillation, giant cell arteritis, hypertension, dyslipidemia, and peripheral vascular disease who presented after a mechanical fall (missed a step). She was found to have an acute right femoral fracture complicated with atrial flutter/ fibrillation with RVR.  Subjective: No major events overnight.  Pain fairly controlled.  Denies chest pain or dyspnea.  Assessment & Plan:  Atrial fibrillation with rapid ventricular response: Still in RVR with heart rate ranging between 90s and 100's.  Was on amiodarone IV, digoxin and beta-blocker. -Cardiology following.  Appreciate recommendations. -Transitioned to oral amiodarone and metoprolol for rhythm and rate control. -Transition back to Eliquis -Follow-up thyroid functions -Currently monitor electrolytes.  Right hip fracture: Status post percutaneous screw fixation on 03/21/2018.  Pain fairly controlled. -Ortho managing. Hypekalemia: Hemolyzed blood.  Repeat K normal. -Closely monitor electrolytes  Corrected to goal of 4.0  Hypomagnesemia : Resolved -Monitor  HTN: Normotensive -Meds as above.  History of giant cell arteritis: Stable.  Followed by rheumatology -Continue prednisone 7.5mg  QD  COPD: Stable -Continue home meds.  DVT prophylaxis: IV heparin  Code Status: FULL CODE Family Communication: Patient husband at bedside. Disposition Plan: Transfer to telemetry  Consultants:  Cardiology  Orthopedics   Antimicrobials:  none  Objective: Blood pressure (!) 123/93, pulse 99, temperature 98.1 F (36.7 C), temperature source Oral, resp. rate 19, height 5\' 3"  (1.6 m), weight 50 kg, SpO2 95 %.  Intake/Output Summary (Last 24 hours) at 03/22/2018 1402 Last data filed at 03/22/2018 0858 Gross per 24 hour  Intake 1699.66 ml  Output 1200  ml  Net 499.66 ml   Filed Weights   03/18/18 0627 03/21/18 1117  Weight: 50.8 kg 50 kg    Examination: No exam today as pt has been in OR/PACU on multiple attempts to visit  = no charge today from [facility]  CBC: Recent Labs  Lab 03/18/18 0652 03/19/18 0505 03/20/18 0329 03/21/18 0227  WBC 12.3* 15.6* 11.8* 11.1*  NEUTROABS 8.8*  --   --   --   HGB 14.3 13.2 12.3 12.4  HCT 43.1 40.4 37.8 38.9  MCV 107.8* 110.7* 110.2* 111.8*  PLT 289 247 228 924   Basic Metabolic Panel: Recent Labs  Lab 03/18/18 1432  03/21/18 0227 03/22/18 0813 03/22/18 1102  NA  --    < > 135 134* 136  K  --    < > 4.7 6.4* 4.4  CL  --    < > 101 105 101  CO2  --    < > 26 17* 24  GLUCOSE  --    < > 75 81 98  BUN  --    < > 8 8 5*  CREATININE  --    < > 0.62 0.64 0.70  CALCIUM  --    < > 8.6* 8.6* 8.7*  MG 1.7  --  2.2 2.2  --    < > = values in this interval not displayed.   GFR: Estimated Creatinine Clearance: 48.7 mL/min (by C-G formula based on SCr of 0.7 mg/dL).  Liver Function Tests: Recent Labs  Lab 03/18/18 0652  AST 20  ALT 24  ALKPHOS 53  BILITOT 1.0  PROT 6.4*  ALBUMIN 4.0    Coagulation Profile: Recent Labs  Lab 03/18/18 0652  INR 0.91     Recent  Results (from the past 240 hour(s))  MRSA PCR Screening     Status: None   Collection Time: 03/19/18  4:35 AM  Result Value Ref Range Status   MRSA by PCR NEGATIVE NEGATIVE Final    Comment:        The GeneXpert MRSA Assay (FDA approved for NASAL specimens only), is one component of a comprehensive MRSA colonization surveillance program. It is not intended to diagnose MRSA infection nor to guide or monitor treatment for MRSA infections. Performed at [facility], [place] [address], [place], [place] [address]      Scheduled Meds: . amiodarone  200 mg Oral BID  . apixaban  5 mg Oral BID  . calcium-vitamin D  1 tablet Oral Q breakfast  . cholecalciferol  3,000 Units Oral Q lunch  . docusate sodium  100 mg Oral  BID  . gabapentin  500 mg Oral QHS  . LORazepam  1 mg Oral QHS  . metoprolol tartrate  25 mg Oral TID  . mometasone-formoterol  2 puff Inhalation BID  . montelukast  10 mg Oral QHS  . predniSONE  7.5 mg Oral Q breakfast  . saccharomyces boulardii  250 mg Oral Daily  . senna  1 tablet Oral BID   Continuous Infusions: . lactated ringers       LOS: 4 days   Dwain Huhn T. [facility] Pager [phone]  If 7PM-7AM, please contact night-coverage [email or web address] Password [email or web address] 03/22/2018, 2:02 PM

## 2018-03-22 NOTE — Evaluation (Addendum)
Occupational Therapy Evaluation Patient Details Name: Crystal [name] MRN: [id number] DOB: [birth year] Today's Date: 03/22/2018    History of Present Illness 72 yo female presenting after mechanical fall at home and sustained right femoral neck fx. S/p percutaneous screw fixation and WBAT. 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    Clinical Impression   PTA, pt was living with her husband and was independent with ADLs; recently stated OT for vision deficits and home modification. Pt currently requiring Mod A for LB ADLs and Min A for functional transfers with RW. Pt very motivated to participate with therapy and has good family support. Pt will require further acute OT to facilitate safe dc. Due to PLOF and recent visual deficits, recommend dc to CIR for further OT to optimize safety, independence with ADLs, and return to PLOF.      Follow Up Recommendations  CIR;Supervision/Assistance - 24 hour    Equipment Recommendations  3 in 1 bedside commode    Recommendations for Other Services PT consult     Precautions / Restrictions Precautions Precautions: Fall Precaution Comments: Legally blind Restrictions Weight Bearing Restrictions: Yes RLE Weight Bearing: Weight bearing as tolerated      Mobility Bed Mobility Overal bed mobility: Needs Assistance Bed Mobility: Supine to Sit     Supine to sit: Min assist     General bed mobility comments: Min A for managing RLE. cues for hand palcement, use of bed rails, and sequencing  Transfers Overall transfer level: Needs assistance Equipment used: Rolling walker (2 wheeled) Transfers: Sit to/from Stand Sit to Stand: Min assist         General transfer comment: Min A to power up into standing. Cues for sequencing, hand placement, and weight shift.     Balance Overall balance assessment: Needs assistance Sitting-balance support: No upper extremity supported;Feet  supported Sitting balance-Leahy Scale: Fair     Standing balance support: Bilateral upper extremity supported;During functional activity Standing balance-Leahy Scale: Poor Standing balance comment: reliant on UE support                           ADL either performed or assessed with clinical judgement   ADL Overall ADL's : Needs assistance/impaired Eating/Feeding: With caregiver independent assisting;Sitting;Set up(Cues for placement) Eating/Feeding Details (indicate cue type and reason): Husband assisting to prep food due to vision deficits Grooming: Set up;Supervision/safety;Sitting   Upper Body Bathing: Set up;Supervision/ safety;Sitting   Lower Body Bathing: Minimal assistance;Sit to/from stand   Upper Body Dressing : Set up;Supervision/safety   Lower Body Dressing: Moderate assistance;Sit to/from stand Lower Body Dressing Details (indicate cue type and reason): Mod A for donning clothing over RLE and manage over hips. Min A for sit<>stand Toilet Transfer: Minimal assistance;Stand-pivot;RW;Cueing for sequencing Toilet Transfer Details (indicate cue type and reason): Min A for power up into standing and then maintain balance during pivot. Cues for sequencign and RW management         Functional mobility during ADLs: Minimal assistance;Rolling walker General ADL Comments: Pt with decreased functional performance due to pain at RLE and vision deficits. Pt motivated to return home and increased independence     Vision Baseline Vision/History: Legally blind       Perception     Praxis      Pertinent Vitals/Pain Pain Assessment: Faces Faces Pain Scale: Hurts even more Pain Location: RLE Pain Descriptors / Indicators: Constant;Grimacing;Discomfort;Guarding Pain Intervention(s): Monitored during  session;Limited activity within patient's tolerance;Repositioned     Hand Dominance Right   Extremity/Trunk Assessment Upper Extremity Assessment Upper Extremity  Assessment: Overall WFL for tasks assessed(Reports shoulder arthritis)   Lower Extremity Assessment Lower Extremity Assessment: RLE deficits/detail RLE Deficits / Details: right femoral neck fx. S/p percutaneous screw fixation RLE Coordination: decreased gross motor   Cervical / Trunk Assessment Cervical / Trunk Assessment: Normal   Communication Communication Communication: No difficulties   Cognition Arousal/Alertness: Awake/alert Behavior During Therapy: WFL for tasks assessed/performed Overall Cognitive Status: Within Functional Limits for tasks assessed                                 General Comments: Very motivated   General Comments  Husband present throughout session    Exercises     Shoulder Instructions      Home Living Family/patient expects to be discharged to:: Private residence Living Arrangements: Spouse/significant other Available Help at Discharge: Family;Available 24 hours/day Type of Home: House Home Access: Stairs to enter [employer] of Steps: 3 Entrance Stairs-Rails: Left Home Layout: Two level;Able to live on main level with bedroom/bathroom     Bathroom Shower/Tub: Occupational psychologist: Standard     Home Equipment: Other (comment)(Sight cane for vision)          Prior Functioning/Environment Level of Independence: Independent        Comments: Pt was performing ADLs independently. Husband assisting with IADLs. Recently started OT to adapt and modify home environment for vision loss.        OT Problem List: Decreased strength;Decreased range of motion;Decreased activity tolerance;Impaired vision/perception;Impaired balance (sitting and/or standing);Decreased knowledge of use of DME or AE;Decreased knowledge of precautions;Pain      OT Treatment/Interventions: Self-care/ADL training;Therapeutic exercise;Energy conservation;DME and/or AE instruction;Therapeutic activities;Patient/family education     OT Goals(Current goals can be found in the care plan section) Acute Rehab OT Goals Patient Stated Goal: "Return to home and continue OT for my vision." OT Goal Formulation: With patient Time For Goal Achievement: 04/05/18 Potential to Achieve Goals: Good ADL Goals Pt Will Perform Lower Body Dressing: with set-up;with supervision;sit to/from stand;with caregiver independent in assisting Pt Will Transfer to Toilet: with set-up;with supervision;bedside commode;ambulating Pt Will Perform Toileting - Clothing Manipulation and hygiene: with set-up;with supervision;sit to/from stand Pt Will Perform Tub/Shower Transfer: Shower transfer;with min guard assist;3 in 1;rolling walker;ambulating Additional ADL Goal #1: Pt will perform bed mobility with supervision in preparation for ADLs  OT Frequency: Min 3X/week   Barriers to D/C:            Co-evaluation              AM-PAC PT "6 Clicks" Daily Activity     Outcome Measure Help from another person eating meals?: A Little Help from another person taking care of personal grooming?: A Little Help from another person toileting, which includes using toliet, bedpan, or urinal?: A Little Help from another person bathing (including washing, rinsing, drying)?: A Lot Help from another person to put on and taking off regular upper body clothing?: A Little Help from another person to put on and taking off regular lower body clothing?: A Lot 6 Click Score: 16   End of Session Equipment Utilized During Treatment: Gait belt;Rolling walker Nurse Communication: Mobility status;Weight bearing status  Activity Tolerance: Patient tolerated treatment well;Patient limited by pain Patient left: in chair;with call bell/phone within reach;with family/visitor present  OT Visit Diagnosis: Unsteadiness on feet (R26.81);Other abnormalities of gait and mobility (R26.89);Muscle weakness (generalized) (M62.81);Pain Pain - Right/Left: Right Pain - part of body: Leg                 Time: 1023-1056 OT Time Calculation (min): 33 min Charges:  OT General Charges $OT Visit: 1 Visit OT Evaluation $OT Eval Moderate Complexity: 1 Mod OT Treatments $Self Care/Home Management : 8-22 mins  Disney Ruggiero MSOT, OTR/L Acute Rehab Pager: [phone] Office: [place] 03/22/2018, 12:00 PM

## 2018-03-22 NOTE — Care Management Note (Signed)
Case Management Note Marvetta Gibbons RN, BSN Transitions of Care Unit 4E- RN Case Manager [phone]  Patient Details  Name: Crystal [name] MRN: [id number] Date of Birth: 16-Aug-1945  Subjective/Objective:   Pt admitted s/p fall at home with afib RVR and hip fx- s/p OR nailing repair of hip                 Action/Plan: PTA pt lived at home with spouse, PT/OT evals ordered- CM to follow for recommendations and transition of care needs  Expected Discharge Date:                  Expected Discharge Plan:  [place]  In-House Referral:     Discharge planning Services  CM Consult  Post Acute Care Choice:  Durable Medical Equipment, Home Health Choice offered to:     DME Arranged:    DME Agency:     HH Arranged:    [place] Agency:     Status of Service:  In process, will continue to follow  If discussed at Long Length of Stay Meetings, dates discussed:    Discharge Disposition:   Additional Comments:  Dawayne Patricia, RN 03/22/2018, 2:29 PM

## 2018-03-22 NOTE — Progress Notes (Addendum)
CRITICAL VALUE ALERT  Critical Value:  K+ 6.4 . Lab states sample is "somewhat hemalysized". Question need to repeat.  Date & Time Notied:  9:45 AM 03/22/2018   Provider Notified: Dr. Cyndia Skeeters paged.  Orders Received/Actions taken: BMET redrawn, awaiting results. Redraw shows 4.4 K+.

## 2018-03-22 NOTE — Progress Notes (Addendum)
Progress Note  Patient Name: Crystal [name] Date of Encounter: 03/22/2018  Primary Cardiologist: [name], MD   Subjective   Pt denies CP   Breathing is OK    Inpatient Medications    Scheduled Meds: . apixaban  2.5 mg Oral BID  . calcium-vitamin D  1 tablet Oral Q breakfast  . cholecalciferol  3,000 Units Oral Q lunch  . digoxin  0.25 mg Intravenous Daily  . docusate sodium  100 mg Oral BID  . gabapentin  500 mg Oral QHS  . LORazepam  1 mg Oral QHS  . metoprolol tartrate  5 mg Intravenous QID  . mometasone-formoterol  2 puff Inhalation BID  . montelukast  10 mg Oral QHS  . predniSONE  7.5 mg Oral Q breakfast  . saccharomyces boulardii  250 mg Oral Daily  . senna  1 tablet Oral BID   Continuous Infusions: . amiodarone 60 mg/hr (03/22/18 0509)  . lactated ringers     PRN Meds: acetaminophen, fentaNYL (SUBLIMAZE) injection, menthol-cetylpyridinium **OR** phenol, metoCLOPramide **OR** metoCLOPramide (REGLAN) injection, ondansetron **OR** ondansetron (ZOFRAN) IV, oxyCODONE, traMADol   Vital Signs    Vitals:   03/21/18 2045 03/22/18 0000 03/22/18 0445 03/22/18 0751  BP:  107/77 105/69   Pulse:  90    Resp:  18 18 (!) 23  Temp:   97.9 F (36.6 C)   TempSrc:   Oral   SpO2: 97% 96% 94% 93%  Weight:      Height:        Intake/Output Summary (Last 24 hours) at 03/22/2018 0807 Last data filed at 03/22/2018 0509 Gross per 24 hour  Intake 1699.66 ml  Output 850 ml  Net 849.66 ml   Filed Weights   03/18/18 0627 03/21/18 1117  Weight: 50.8 kg 50 kg    Telemetry    afib with RVR   110s to 130  - Personally Reviewed  ECG      Physical Exam  Thin 72 yo in NAD   GEN: No acute distress.   Neck: JVP is normal    Cardiac: Irreg irreg Nl S1, S2   no murmurs, rubs, or gallops.  Respiratory: Clear to auscultation bilaterally. GI: Soft, nontender, non-distended  MS: No edema; No deformity. Neuro:  Nonfocal  Psych: Normal affect   Labs    Chemistry Recent  Labs  Lab 03/18/18 0652 03/20/18 0329 03/21/18 0227  NA 137 134* 135  K 3.8 3.2* 4.7  CL 94* 96* 101  CO2 29 27 26   GLUCOSE 81 83 75  BUN 14 11 8   CREATININE 0.74 0.70 0.62  CALCIUM 9.8 8.7* 8.6*  PROT 6.4*  --   --   ALBUMIN 4.0  --   --   AST 20  --   --   ALT 24  --   --   ALKPHOS 53  --   --   BILITOT 1.0  --   --   GFRNONAA >60 >60 >60  GFRAA >60 >60 >60  ANIONGAP 14 11 8      Hematology Recent Labs  Lab 03/19/18 0505 03/20/18 0329 03/21/18 0227  WBC 15.6* 11.8* 11.1*  RBC 3.65* 3.43* 3.48*  HGB 13.2 12.3 12.4  HCT 40.4 37.8 38.9  MCV 110.7* 110.2* 111.8*  MCH 36.2* 35.9* 35.6*  MCHC 32.7 32.5 31.9  RDW 16.8* 16.5* 16.4*  PLT 247 228 211    Cardiac EnzymesNo results for input(s): TROPONINI in the last 168 hours. No results for input(s):  TROPIPOC in the last 168 hours.   BNP Recent Labs  Lab 03/18/18 0652  BNP 534.0*     DDimer No results for input(s): DDIMER in the last 168 hours.   Radiology    Pelvis Portable  Result Date: 03/21/2018 CLINICAL DATA:  72 year old female with hip fracture. Subsequent encounter. EXAM: PORTABLE PELVIS 1-2 VIEWS COMPARISON:  03/21/2018 intraoperative films. FINDINGS: Three pins traverse right subcapital fracture. Pins appear in appropriate position on this frontal projection. IMPRESSION: Post pinning right femoral neck fracture. Electronically Signed   By: Genia Del M.D.   On: 03/21/2018 16:27   Dg C-arm 1-60 Min  Result Date: 03/21/2018 CLINICAL DATA:  Surgical internal fixation of right hip fracture. EXAM: OPERATIVE right HIP (WITH PELVIS IF PERFORMED) 3 VIEWS TECHNIQUE: Fluoroscopic spot image(s) were submitted for interpretation post-operatively. FLUOROSCOPY TIME:  1 minutes 34 seconds. COMPARISON:  Radiographs of March 18, 2018. FINDINGS: Three intraoperative fluoroscopic images of the right hip demonstrate the patient be status post surgical internal fixation of proximal right femoral neck fracture. Good  alignment of fracture components is noted. IMPRESSION: Status post surgical internal fixation of proximal right femoral neck fracture. Electronically Signed   By: Marijo Conception, M.D.   On: 03/21/2018 14:46   Dg Hip Operative Unilat W Or W/o Pelvis Right  Result Date: 03/21/2018 CLINICAL DATA:  Surgical internal fixation of right hip fracture. EXAM: OPERATIVE right HIP (WITH PELVIS IF PERFORMED) 3 VIEWS TECHNIQUE: Fluoroscopic spot image(s) were submitted for interpretation post-operatively. FLUOROSCOPY TIME:  1 minutes 34 seconds. COMPARISON:  Radiographs of March 18, 2018. FINDINGS: Three intraoperative fluoroscopic images of the right hip demonstrate the patient be status post surgical internal fixation of proximal right femoral neck fracture. Good alignment of fracture components is noted. IMPRESSION: Status post surgical internal fixation of proximal right femoral neck fracture. Electronically Signed   By: Marijo Conception, M.D.   On: 03/21/2018 14:46    Cardiac Studies     Patient Profile     Crystal [name] a 72 y.o.femalewith past medical history of PAF (on Eliquis), giant cell arteritis (now legally blind), HTN, HLD, and PVD (known right SFA occlusion)who we were asked to see for new onset A. fib with RVR in the setting of a hip fracture and extreme pain on Eliquis oral anticoagulation at home.  She is scheduled for right hip orthopedic surgical procedure tomorrow by Dr. Lyla Glassing.  Assessment & Plan    1  Atrial fibrillatoin   Prior to admit patient with PAF   Has been in afib since admit   Afib with RVR   On amiodarone IV , digoxin, metoprolol   Rates have been high until yesterday/today Currently on 2.5 bid eliquis   Should  Be 5 bid   Continue digoxin today but will back off   COntinue b blocker and amio for now.  Switchto po    2  Thyroid  Check T3, T4  Esp with amio use   WIll need to determine long term    3   POD 1 Repair of Hip Fx    4  Hx giant cell arteritis  Follows  with rheumatology  Question if she needed higher dose of steroids with current state     5  HTN  BP on low side  Follow     6  HL   On pravastatin 40 mg    7  PVOD   Chronic SVA occlusion  For questions or updates, please contact [place] Please consult [email or web address] for contact info under        Signed, Dorris Carnes, MD  03/22/2018, 8:07 AM

## 2018-03-22 NOTE — Progress Notes (Signed)
Rehab Admissions Coordinator Note:  Patient was screened by Cleatrice Burke for appropriateness for an Inpatient Acute Rehab Consult per PT recommendation.  At this time, we are recommending Inpatient Rehab consult if pt would like to be considered for admit prior to d/c home. Please advise.  Danne Baxter, RN, MSN Rehab Admissions Coordinator [phone] 03/22/2018 4:54 PM

## 2018-03-23 ENCOUNTER — Other Ambulatory Visit: Payer: Self-pay | Admitting: Internal Medicine

## 2018-03-23 DIAGNOSIS — I4891 Unspecified atrial fibrillation: Secondary | ICD-10-CM

## 2018-03-23 DIAGNOSIS — S72001S Fracture of unspecified part of neck of right femur, sequela: Secondary | ICD-10-CM

## 2018-03-23 DIAGNOSIS — I482 Chronic atrial fibrillation, unspecified: Secondary | ICD-10-CM

## 2018-03-23 LAB — CBC WITH DIFFERENTIAL/PLATELET
Basophils Absolute: 0.1 10*3/uL (ref 0.0–0.1)
Basophils Relative: 1 %
Eosinophils Absolute: 0.1 10*3/uL (ref 0.0–0.5)
Eosinophils Relative: 1 %
HCT: 38.6 % (ref 36.0–46.0)
Hemoglobin: 12 g/dL (ref 12.0–15.0)
Lymphocytes Relative: 22 %
Lymphs Abs: 1.8 10*3/uL (ref 0.7–4.0)
MCH: 35.2 pg — ABNORMAL HIGH (ref 26.0–34.0)
MCHC: 31.1 g/dL (ref 30.0–36.0)
MCV: 113.2 fL — ABNORMAL HIGH (ref 80.0–100.0)
Monocytes Absolute: 1 10*3/uL (ref 0.1–1.0)
Monocytes Relative: 12 %
Neutro Abs: 5.4 10*3/uL (ref 1.7–7.7)
Neutrophils Relative %: 64 %
Platelets: 219 10*3/uL (ref 150–400)
RBC: 3.41 MIL/uL — ABNORMAL LOW (ref 3.87–5.11)
RDW: 16.7 % — ABNORMAL HIGH (ref 11.5–15.5)
WBC: 8.4 10*3/uL (ref 4.0–10.5)
nRBC: 0 % (ref 0.0–0.2)
nRBC: 0 /100 WBC

## 2018-03-23 LAB — BASIC METABOLIC PANEL
Anion gap: 6 (ref 5–15)
BUN: 14 mg/dL (ref 8–23)
CO2: 24 mmol/L (ref 22–32)
Calcium: 8.8 mg/dL — ABNORMAL LOW (ref 8.9–10.3)
Chloride: 105 mmol/L (ref 98–111)
Creatinine, Ser: 0.95 mg/dL (ref 0.44–1.00)
GFR calc Af Amer: >60 mL/min (ref >60)
GFR calc non Af Amer: 58 mL/min — ABNORMAL LOW (ref >60)
Glucose, Bld: 93 mg/dL (ref 70–99)
Potassium: 5.4 mmol/L — ABNORMAL HIGH (ref 3.5–5.1)
Sodium: 135 mmol/L (ref 135–145)

## 2018-03-23 LAB — T3, FREE: T3, Free: 0.9 pg/mL — ABNORMAL LOW (ref 2.0–4.4)

## 2018-03-23 MED ORDER — LEVOTHYROXINE SODIUM 25 MCG PO TABS
25.0000 ug | ORAL_TABLET | Freq: Every day | ORAL | Status: DC
  Administered 2018-03-23: 25 ug via ORAL
  Filled 2018-03-23 (×2): qty 1

## 2018-03-23 MED ORDER — METOPROLOL TARTRATE 25 MG PO TABS
25.0000 mg | ORAL_TABLET | Freq: Two times a day (BID) | ORAL | Status: DC
  Administered 2018-03-23: 25 mg via ORAL
  Filled 2018-03-23: qty 1

## 2018-03-23 MED ORDER — METOPROLOL TARTRATE 25 MG PO TABS
25.0000 mg | ORAL_TABLET | Freq: Once | ORAL | Status: AC
  Administered 2018-03-23: 25 mg via ORAL
  Filled 2018-03-23: qty 1

## 2018-03-23 MED ORDER — METOPROLOL TARTRATE 50 MG PO TABS
50.0000 mg | ORAL_TABLET | Freq: Two times a day (BID) | ORAL | Status: DC
  Administered 2018-03-23 – 2018-03-24 (×2): 50 mg via ORAL
  Filled 2018-03-23 (×2): qty 1

## 2018-03-23 MED ORDER — PREDNISONE 5 MG PO TABS
15.0000 mg | ORAL_TABLET | Freq: Every day | ORAL | Status: DC
  Administered 2018-03-23 – 2018-03-24 (×2): 15 mg via ORAL
  Filled 2018-03-23 (×2): qty 1

## 2018-03-23 NOTE — Progress Notes (Signed)
Progress Note  Patient Name: Crystal [name] Date of Encounter: 03/23/2018  Primary Cardiologist: [name], MD   Subjective   Breathing is OK   No CP    Inpatient Medications    Scheduled Meds: . amiodarone  200 mg Oral BID  . apixaban  5 mg Oral BID  . calcium-vitamin D  1 tablet Oral Q breakfast  . cholecalciferol  3,000 Units Oral Q lunch  . docusate sodium  100 mg Oral BID  . gabapentin  500 mg Oral QHS  . LORazepam  1 mg Oral QHS  . metoprolol tartrate  25 mg Oral BID  . mometasone-formoterol  2 puff Inhalation BID  . montelukast  10 mg Oral QHS  . predniSONE  15 mg Oral Q breakfast  . saccharomyces boulardii  250 mg Oral Daily  . senna  1 tablet Oral BID   Continuous Infusions: . lactated ringers     PRN Meds: acetaminophen, albuterol, fentaNYL (SUBLIMAZE) injection, menthol-cetylpyridinium **OR** phenol, metoCLOPramide **OR** metoCLOPramide (REGLAN) injection, ondansetron **OR** ondansetron (ZOFRAN) IV, oxyCODONE, traMADol   Vital Signs    Vitals:   03/22/18 2000 03/22/18 2217 03/23/18 0400 03/23/18 0731  BP: 132/65 102/65 (!) 90/59   Pulse: 87 100    Resp: (!) 22  18 15   Temp: 97.8 F (36.6 C)  (!) 87.8 F (31 C)   TempSrc: Oral  Oral   SpO2:    96%  Weight:      Height:        Intake/Output Summary (Last 24 hours) at 03/23/2018 0811 Last data filed at 03/22/2018 1700 Gross per 24 hour  Intake -  Output 1000 ml  Net -1000 ml   Filed Weights   03/18/18 0627 03/21/18 1117  Weight: 50.8 kg 50 kg    Telemetry    afib with RVR   Rates 80s to 100 - Personally Reviewed  ECG      Physical Exam  Thin 72 yo in NAD   GEN: No acute distress.   Neck: JVP is normal    Cardiac: Irreg irreg  No\, rubs, or gallops. Or murmurs   Respiratory: Clear to auscultation bilaterally. GI: Soft, nontender, non-distended  MS: No edema; No deformity. Neuro:  Nonfocal  Psych: Normal affect   Labs    Chemistry Recent Labs  Lab 03/18/18 0652   03/22/18 0813 03/22/18 1102 03/23/18 0254  NA 137   < > 134* 136 135  K 3.8   < > 6.4* 4.4 5.4*  CL 94*   < > 105 101 105  CO2 29   < > 17* 24 24  GLUCOSE 81   < > 81 98 93  BUN 14   < > 8 5* 14  CREATININE 0.74   < > 0.64 0.70 0.95  CALCIUM 9.8   < > 8.6* 8.7* 8.8*  PROT 6.4*  --   --   --   --   ALBUMIN 4.0  --   --   --   --   AST 20  --   --   --   --   ALT 24  --   --   --   --   ALKPHOS 53  --   --   --   --   BILITOT 1.0  --   --   --   --   GFRNONAA >60   < > >60 >60 58*  GFRAA >60   < > >60 >60 >  60  ANIONGAP 14   < > 12 11 6    < > = values in this interval not displayed.     Hematology Recent Labs  Lab 03/20/18 0329 03/21/18 0227 03/23/18 0254  WBC 11.8* 11.1* 8.4  RBC 3.43* 3.48* 3.41*  HGB 12.3 12.4 12.0  HCT 37.8 38.9 38.6  MCV 110.2* 111.8* 113.2*  MCH 35.9* 35.6* 35.2*  MCHC 32.5 31.9 31.1  RDW 16.5* 16.4* 16.7*  PLT 228 211 219    Cardiac EnzymesNo results for input(s): TROPONINI in the last 168 hours. No results for input(s): TROPIPOC in the last 168 hours.   BNP Recent Labs  Lab 03/18/18 0652  BNP 534.0*     DDimer No results for input(s): DDIMER in the last 168 hours.   Radiology    Pelvis Portable  Result Date: 03/21/2018 CLINICAL DATA:  72 year old female with hip fracture. Subsequent encounter. EXAM: PORTABLE PELVIS 1-2 VIEWS COMPARISON:  03/21/2018 intraoperative films. FINDINGS: Three pins traverse right subcapital fracture. Pins appear in appropriate position on this frontal projection. IMPRESSION: Post pinning right femoral neck fracture. Electronically Signed   By: Genia Del M.D.   On: 03/21/2018 16:27   Dg C-arm 1-60 Min  Result Date: 03/21/2018 CLINICAL DATA:  Surgical internal fixation of right hip fracture. EXAM: OPERATIVE right HIP (WITH PELVIS IF PERFORMED) 3 VIEWS TECHNIQUE: Fluoroscopic spot image(s) were submitted for interpretation post-operatively. FLUOROSCOPY TIME:  1 minutes 34 seconds. COMPARISON:  Radiographs of  March 18, 2018. FINDINGS: Three intraoperative fluoroscopic images of the right hip demonstrate the patient be status post surgical internal fixation of proximal right femoral neck fracture. Good alignment of fracture components is noted. IMPRESSION: Status post surgical internal fixation of proximal right femoral neck fracture. Electronically Signed   By: Marijo Conception, M.D.   On: 03/21/2018 14:46   Dg Hip Operative Unilat W Or W/o Pelvis Right  Result Date: 03/21/2018 CLINICAL DATA:  Surgical internal fixation of right hip fracture. EXAM: OPERATIVE right HIP (WITH PELVIS IF PERFORMED) 3 VIEWS TECHNIQUE: Fluoroscopic spot image(s) were submitted for interpretation post-operatively. FLUOROSCOPY TIME:  1 minutes 34 seconds. COMPARISON:  Radiographs of March 18, 2018. FINDINGS: Three intraoperative fluoroscopic images of the right hip demonstrate the patient be status post surgical internal fixation of proximal right femoral neck fracture. Good alignment of fracture components is noted. IMPRESSION: Status post surgical internal fixation of proximal right femoral neck fracture. Electronically Signed   By: Marijo Conception, M.D.   On: 03/21/2018 14:46    Cardiac Studies     Patient Profile     Crystal [name] a 72 y.o.femalewith past medical history of PAF (on Eliquis), giant cell arteritis (now legally blind), HTN, HLD, and PVD (known right SFA occlusion)who we were asked to see for new onset A. fib with RVR in the setting of a hip fracture and extreme pain on Eliquis oral anticoagulation at home.  She is scheduled for right hip orthopedic surgical procedure tomorrow by Dr. Lyla Glassing.  Assessment & Plan    1  Atrial fib  Prior to admit was PAF   Has been in afib here   Rates better with amiodarone   I would d/c digoxin  On metoprolol   Follow   Eliquis resumed    I have taken off of theophylline  2  Ortho  Now post op  Plan for rehab   3   Thyroid   TSH a little high  Free t3 a little low T  4  normal   Would Rx with low dose     For questions or updates, please contact [place] Please consult [email or web address] for contact info under        Signed, Dorris Carnes, MD  03/23/2018, 8:11 AM

## 2018-03-23 NOTE — Progress Notes (Signed)
Physical Therapy Treatment Patient Details Name: Crystal [name] MRN: [id number] DOB: 01-02-1946 Today's Date: 03/23/2018    History of Present Illness 72 yo female presenting after mechanical fall at home and sustained right femoral neck fx. S/p percutaneous screw fixation and WBAT. 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     PT Comments    Patient progressing steadily towards her physical therapy goals. Ambulating 12 feet with walker and min guard assist this session. Further distance limited by nausea. Rest of session focused on therapeutic exercises for right lower extremity strengthening. Patient continues with good motivation and participation. Continue to recommend comprehensive inpatient rehab (CIR) for post-acute therapy needs.    Follow Up Recommendations  CIR     Equipment Recommendations  Rolling walker with 5" wheels    Recommendations for Other Services Rehab consult     Precautions / Restrictions Precautions Precautions: Fall Precaution Comments: Legally blind Restrictions Weight Bearing Restrictions: Yes RLE Weight Bearing: Weight bearing as tolerated    Mobility  Bed Mobility Overal bed mobility: Needs Assistance Bed Mobility: Sit to Supine       Sit to supine: Min assist   General bed mobility comments: Min assist for managing RLE  Transfers Overall transfer level: Needs assistance Equipment used: Rolling walker (2 wheeled) Transfers: Sit to/from Stand Sit to Stand: Min guard;Min assist         General transfer comment: Cues for sequencing and guidance for hand placement  Ambulation/Gait Ambulation/Gait assistance: Min guard Gait Distance (Feet): 12 Feet Assistive device: Rolling walker (2 wheeled) Gait Pattern/deviations: Step-to pattern;Decreased stride length;Antalgic Gait velocity: decr Gait velocity interpretation: <1.31 ft/sec, indicative of household ambulator General Gait  Details: Directional cues and manual assistance for walker negotiation. Increased weightbearing through RLE.   Stairs             Wheelchair Mobility    Modified Rankin (Stroke Patients Only)       Balance Overall balance assessment: Needs assistance Sitting-balance support: No upper extremity supported;Feet supported Sitting balance-Leahy Scale: Good     Standing balance support: Bilateral upper extremity supported;During functional activity Standing balance-Leahy Scale: Poor Standing balance comment: reliant on UE support                            Cognition Arousal/Alertness: Awake/alert Behavior During Therapy: WFL for tasks assessed/performed Overall Cognitive Status: Within Functional Limits for tasks assessed                                 General Comments: Very motivated, stating, "I don't want to cheat," during her exercises      Exercises General Exercises - Lower Extremity Long Arc Quad: 20 reps;Both;Seated Hip ABduction/ADduction: 15 reps;Both;Seated Hip Flexion/Marching: Both;5 reps;Seated    General Comments        Pertinent Vitals/Pain Pain Assessment: Faces Faces Pain Scale: Hurts little more Pain Location: RLE Pain Descriptors / Indicators: Constant;Grimacing;Discomfort;Guarding Pain Intervention(s): Monitored during session    Home Living                      Prior Function            PT Goals (current goals can now be found in the care plan section) Acute Rehab PT Goals Patient Stated Goal: "Return to home and continue OT for my vision."  Potential to Achieve Goals: Good Progress towards PT goals: Progressing toward goals    Frequency    Min 4X/week      PT Plan Current plan remains appropriate    Co-evaluation              AM-PAC PT "6 Clicks" Daily Activity  Outcome Measure  Difficulty turning over in bed (including adjusting bedclothes, sheets and blankets)?:  Unable Difficulty moving from lying on back to sitting on the side of the bed? : Unable Difficulty sitting down on and standing up from a chair with arms (e.g., wheelchair, bedside commode, etc,.)?: Unable Help needed moving to and from a bed to chair (including a wheelchair)?: A Little Help needed walking in hospital room?: A Little Help needed climbing 3-5 steps with a railing? : A Lot 6 Click Score: 11    End of Session Equipment Utilized During Treatment: Gait belt Activity Tolerance: Other (comment)(limited by nausea) Patient left: in bed;with call Crystal/phone within reach;with family/visitor present Nurse Communication: Mobility status PT Visit Diagnosis: History of falling (Z91.81);Muscle weakness (generalized) (M62.81);Difficulty in walking, not elsewhere classified (R26.2)     Time: 2841-3244 PT Time Calculation (min) (ACUTE ONLY): 31 min  Charges:  $Therapeutic Exercise: 8-22 mins $Therapeutic Activity: 8-22 mins                    Ellamae Sia, PT, DPT Acute Rehabilitation Services Pager [phone] Office [phone]    Willy Eddy 03/23/2018, 5:03 PM

## 2018-03-23 NOTE — PMR Pre-admission (Signed)
PMR Admission Coordinator Pre-Admission Assessment  Patient: Crystal [name] is an 72 y.o., female MRN: [id number] DOB: 05-29-1946 Height: _0  (160 cm) Weight: 50 kg              Insurance Information HMO: Yes    PPO:      PCP:      IPA:      80/20:      OTHER:  PRIMARY: BCBS Medicare      Policy#: [id number]      Subscriber: Patient CM Name: Crystal [name]     Phone#: [phone]      Fax#: [phone] Pre-Cert#: TBD once pt arrives to unit (will update in [place] note)      Employer:  Auth provided by Santiago Glad at [facility] on 03/24/18 for admit date of 03/24/18.  Benefits:  Phone #: NA     Name: [email or web address] Eff. Date: 06/14/17     Deduct: $0      Out of Pocket Max: $5,500 (met [phone])      Life Max: NA CIR: $310/day for days 1-6, $0/day for days 7+      SNF: $0/day for days 1-20, $172/day for days 21-60, $0/day for days 61-100 (100 day limit) Outpatient: per necessity      Co-Pay: $40/visit  Home Health: 100%, per necessity      Co-Pay:  DME: 80%     Co-Pay: 20% Providers:  SECONDARY:       Policy#:       Subscriber:  CM Name:       Phone#:      Fax#:  Pre-Cert#:       Employer:  Benefits:  Phone #:      Name:  Eff. Date:      Deduct:       Out of Pocket Max:       Life Max:  CIR:       SNF:  Outpatient:      Co-Pay:  Home Health:       Co-Pay:  DME:      Co-Pay:   Medicaid Application Date:       Case Manager:  Disability Application Date:       Case Worker:   Emergency Contact Information Contact Information    Name Relation Home Work Mobile   [name] Spouse [phone]  [phone]     Current Medical History  Patient Admitting Diagnosis: Right femoral neck fracture after fall s/p percutaneous screw. Legally blind  History of Present Illness: Crystal [name] is a 72 year old right-handed female with history of atrial fibrillation maintained on Eliquis, temporal arteritis with total blindness maintained on chronic prednisone, hypertension.  Per chart review patient  lives with spouse.  Multilevel home with bedroom on main level.  2 steps to entry.  Reported to be using a cane prior to admission and needing assistance due to blindness.  They have a son in the area that works.  Husband can assist but also uses a cane.  Presented 03/18/2018 after mechanical fall.  No loss of consciousness.  Cranial CT scan as well as CT cervical spine negative.  X-rays imaging revealed right femoral neck fracture.  Preoperative clearance by cardiology services initially transitioned to IV heparin with Eliquis held with plan for surgical intervention and underwent percutaneous screw fixation 03/21/2018 per Dr. Lyla Glassing.  Hospital course pain management.  Weightbearing as tolerated right lower extremity.  Eliquis has been resumed for atrial fibrillation as well as maintained on amiodarone.  Therapy  evaluations completed with recommendations of physical medicine rehab consult.  Patient is to be admitted for a comprehensive rehab program on 03/23/18.        Past Medical History  Past Medical History:  Diagnosis Date  . Adenomatous colon polyp 2007 & 2010    Dr [name]  . Allergy    seasonal  . Arthritis   . Cataract   . Diverticulosis   . 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 Hyperlipidemia   . Hypertension   . IBS (irritable bowel syndrome)   . Legally blind   . MVP (mitral valve prolapse)   . Paroxysmal atrial fibrillation with rapid ventricular response ([place]) 08/29/2017  . Peripheral vascular disease ([place])   . RAD (reactive airway disease)     Family History  family history includes COPD in her sister; Diabetes in her sister; Heart attack in her father; Heart attack (age of onset: 60) in her brother; Other in her son; Parkinsonism in her mother; Stroke in her mother.  Prior Rehab/Hospitalizations:  Has the patient had major surgery during 100 days prior to admission? No  Current Medications   Current Facility-Administered Medications:  .  acetaminophen  (TYLENOL) tablet 650 mg, 650 mg, Oral, Q4H PRN, Wendee Beavers T, MD, 650 mg at 03/23/18 0938 .  albuterol (PROVENTIL) (2.5 MG/3ML) 0.083% nebulizer solution 2.5 mg, 2.5 mg, Inhalation, Q6H PRN, Cyndia Skeeters, Taye T, MD .  amiodarone (PACERONE) tablet 200 mg, 200 mg, Oral, BID, [place], Peter M, MD, 200 mg at 03/24/18 0829 .  [START ON 04/05/2018] amiodarone (PACERONE) tablet 200 mg, 200 mg, Oral, Daily, [place], Peter M, MD .  apixaban [facility]) tablet 5 mg, 5 mg, Oral, BID, Wendee Beavers T, MD, 5 mg at 03/24/18 0829 .  calcium-vitamin D (OSCAL WITH D) 500-200 MG-UNIT per tablet 1 tablet, 1 tablet, Oral, Q breakfast, Cyndia Skeeters, Taye T, MD, 1 tablet at 03/24/18 0830 .  cholecalciferol (VITAMIN D) tablet 3,000 Units, 3,000 Units, Oral, Q lunch, Wendee Beavers T, MD, 3,000 Units at 03/24/18 1153 .  docusate sodium (COLACE) capsule 100 mg, 100 mg, Oral, BID, Cyndia Skeeters, Taye T, MD, 100 mg at 03/23/18 0940 .  fentaNYL (SUBLIMAZE) injection 25 mcg, 25 mcg, Intravenous, Q30 min PRN, Wendee Beavers T, MD, 25 mcg at 03/22/18 4098 .  gabapentin (NEURONTIN) capsule 500 mg, 500 mg, Oral, QHS, Gonfa, Taye T, MD, 500 mg at 03/23/18 2156 .  lactated ringers infusion, , Intravenous, Continuous, Gonfa, Taye T, MD .  Derrill Memo ON 03/25/2018] levothyroxine (SYNTHROID, LEVOTHROID) tablet 25 mcg, 25 mcg, Oral, QAC breakfast, [place], Suann Larry, MD .  LORazepam (ATIVAN) tablet 1 mg, 1 mg, Oral, QHS, Gonfa, Taye T, MD, 1 mg at 03/23/18 2156 .  menthol-cetylpyridinium (CEPACOL) lozenge 3 mg, 1 lozenge, Oral, PRN **OR** phenol (CHLORASEPTIC) mouth spray 1 spray, 1 spray, Mouth/Throat, PRN, Gonfa, Taye T, MD .  metoCLOPramide (REGLAN) tablet 5-10 mg, 5-10 mg, Oral, Q8H PRN **OR** metoCLOPramide (REGLAN) injection 5-10 mg, 5-10 mg, Intravenous, Q8H PRN, Gonfa, Taye T, MD .  metoprolol tartrate (LOPRESSOR) tablet 50 mg, 50 mg, Oral, BID, [place], Suann Larry, MD, 50 mg at 03/24/18 0828 .  mometasone-formoterol (DULERA) 200-5 MCG/ACT inhaler 2 puff, 2  puff, Inhalation, BID, Wendee Beavers T, MD, 2 puff at 03/24/18 0717 .  montelukast (SINGULAIR) tablet 10 mg, 10 mg, Oral, QHS, Gonfa, Taye T, MD, 10 mg at 03/23/18 2200 .  ondansetron (ZOFRAN) tablet 4 mg, 4 mg, Oral, Q6H PRN **OR** ondansetron (ZOFRAN) injection 4 mg, 4  mg, Intravenous, Q6H PRN, Gonfa, Taye T, MD .  oxyCODONE (Oxy IR/ROXICODONE) immediate release tablet 5-10 mg, 5-10 mg, Oral, Q4H PRN, Cyndia Skeeters, Taye T, MD, 10 mg at 03/24/18 1644 .  predniSONE (DELTASONE) tablet 15 mg, 15 mg, Oral, Q breakfast, [place], Suann Larry, MD, 15 mg at 03/24/18 0828 .  saccharomyces boulardii (FLORASTOR) capsule 250 mg, 250 mg, Oral, Daily, Gonfa, Taye T, MD, 250 mg at 03/24/18 0830 .  senna (SENOKOT) tablet 8.6 mg, 1 tablet, Oral, BID, Cyndia Skeeters, Taye T, MD, 8.6 mg at 03/22/18 2217 .  traMADol (ULTRAM) tablet 50 mg, 50 mg, Oral, Q6H PRN, Cyndia Skeeters, Taye T, MD, 50 mg at 03/24/18 1456  Patients Current Diet:  Diet Order            Diet - low sodium heart healthy        Diet Heart Room service appropriate? Yes; Fluid consistency: Thin  Diet effective now              Precautions / Restrictions Precautions Precautions: Fall Precaution Comments: Legally blind Restrictions Weight Bearing Restrictions: Yes RLE Weight Bearing: Weight bearing as tolerated   Has the patient had 2 or more falls or a fall with injury in the past year?Yes  Prior Activity Level Community (5-7x/wk): acitve, socializing with friends, went to gym   [place] Devices/Equipment: Cane (specify quad or straight) Home Equipment: Other (comment)(white cane)  Prior Device Use: Indicate devices/aids used by the patient prior to current illness, exacerbation or injury? weighted cane for low vision/complete vision loss  Prior Functional Level Prior Function Level of Independence: Independent Comments: Pt was performing ADLs independently. Husband assisting with IADLs. Recently started OT to  adapt and modify home environment for vision loss.  Self Care: Did the patient need help bathing, dressing, using the toilet or eating?  Independent  Indoor Mobility: Did the patient need assistance with walking from room to room (with or without device)? Independent  Stairs: Did the patient need assistance with internal or external stairs (with or without device)? Independent  Functional Cognition: Did the patient need help planning regular tasks such as shopping or remembering to take medications? Independent  Current Functional Level Cognition  Overall Cognitive Status: Within Functional Limits for tasks assessed Orientation Level: Oriented X4 General Comments: Very motivated    Extremity Assessment (includes Sensation/Coordination)  Upper Extremity Assessment: Defer to OT evaluation  Lower Extremity Assessment: RLE deficits/detail RLE Deficits / Details: AAROM limited by pain for hip and knee flexion with edema noted, able to flex ankle WFL and knee extension strength 3-/5 RLE Coordination: decreased gross motor    ADLs  Overall ADL's : Needs assistance/impaired Eating/Feeding: With caregiver independent assisting, Sitting, Set up(Cues for placement) Eating/Feeding Details (indicate cue type and reason): Husband assisting to prep food due to vision deficits Grooming: Set up, Supervision/safety, Standing, Cueing for compensatory techniques, Cueing for sequencing, Wash/dry hands Upper Body Bathing: Set up, Supervision/ safety, Sitting Lower Body Bathing: Minimal assistance, Sit to/from stand Upper Body Dressing : Set up, Supervision/safety Lower Body Dressing: Moderate assistance, Sit to/from stand Lower Body Dressing Details (indicate cue type and reason): Mod A for donning clothing over RLE and manage over hips. Min A for sit<>stand Toilet Transfer: 90, RW, Cueing for sequencing, Min Psychiatric nurse Details (indicate cue type and reason): Min A for power up into  standing and then maintain balance during pivot. Cues for sequencign and RW management Toileting- Clothing Manipulation and Hygiene: Set up,  Sitting/lateral lean Functional mobility during ADLs: Minimal assistance, Rolling walker General ADL Comments: pt. c/o B wrist pain due to "squeezing" the RW too hard.  wants to know if anything can be put on rw handles    Mobility  Overal bed mobility: Needs Assistance Bed Mobility: Supine to Sit Supine to sit: Min guard Sit to supine: Min assist General bed mobility comments: Patient able to elevate legs up ~75% and PT assisted with elevation the additional 25%. Use of bed rail. Tactile/auditory cueing provided for direction     Transfers  Overall transfer level: Needs assistance Equipment used: Rolling walker (2 wheeled) Transfers: Sit to/from Stand Sit to Stand: Min guard Stand pivot transfers: Min assist General transfer comment: Mod verbal cueing for safe hand placement. Decreased RLE weightbearing    Ambulation / Gait / Stairs / Wheelchair Mobility  Ambulation/Gait Ambulation/Gait assistance: Herbalist (Feet): 30 Feet Assistive device: Rolling walker (2 wheeled) Gait Pattern/deviations: Step-to pattern, Decreased stride length, Antalgic, Step-through pattern, Decreased step length - left General Gait Details: Verbal cueing for increased RLE weightbearing, progression to step through pattern, and decreased right step length. Patient able to progress to step through pattern for ~25% of gait. Frequent manual assistance and cueing provided for steering and negotiating walker.  Gait velocity: decr Gait velocity interpretation: <1.31 ft/sec, indicative of household ambulator    Posture / Balance Balance Overall balance assessment: Needs assistance Sitting-balance support: No upper extremity supported, Feet supported Sitting balance-Leahy Scale: Good Standing balance support: Bilateral upper extremity supported, During functional  activity Standing balance-Leahy Scale: Poor Standing balance comment: reliant on UE support    Special needs/care consideration BiPAP/CPAP: no CPM: no Continuous Drip IV: no Dialysis: no        Days: NA Life Vest: no Oxygen: no Special Bed:no Trach Size: no Wound Vac (area): no      Location: no Skin: ecchymosis on Bilateral arms and legs, moisture associated skin damage to labia, buttocks; incision to right hip.  Bowel mgmt:continent, last BM: 10/101/19 Bladder mgmt: continent Diabetic mgmt: no     Previous Home Environment Living Arrangements: Spouse/significant other Available Help at Discharge: Family, Available 24 hours/day Type of Home: House Home Layout: Able to live on main level with bedroom/bathroom, Multi-level Alternate Level Stairs-Number of Steps: can stay on main Home Access: Stairs to enter Entrance Stairs-Rails: Left Entrance Stairs-Number of Steps: 2 steeper steps in garage with rail, 2-3 smaller steps no rail in front Bathroom Shower/Tub: Multimedia programmer: [place]: Yes Type of Home Care Services: [place] (if known): [employer]   Discharge Living Setting Plans for Discharge Living Setting: Patient's home, Lives with (comment)(husband) Type of Home at Discharge: House Discharge Home Layout: Multi-level, Able to live on main level with bedroom/bathroom Alternate Level Stairs-Rails: None(can live on first floor) Alternate Level Stairs-Number of Steps: NA Discharge Home Access: Stairs to enter Entrance Stairs-Rails: Left Entrance Stairs-Number of Steps: 3 with ledge Discharge Bathroom Shower/Tub: Walk-in shower Discharge Bathroom Toilet: Standard Discharge Bathroom Accessibility: Yes How Accessible: Accessible via walker Does the patient have any problems obtaining your medications?: No  Social/Family/Support Systems Patient Roles: Spouse Contact Information: husband is emergency contact Anticipated  Caregiver: husband  Anticipated Caregiver's Contact Information: husband cell: [phone]/ home:[phone] Ability/Limitations of Caregiver: husband can provide supervision due to his phsyical limitations/possible some CGA; son can assist as needed but is not close by Caregiver Availability: 24/7 Discharge Plan Discussed with Primary Caregiver: Yes(husband and pt's son)  Is Caregiver In Agreement with Plan?: Yes Does Caregiver/Family have Issues with Lodging/Transportation while Pt is in Rehab?: No   Goals/Additional Needs Patient/Family Goal for Rehab: PT/OT: Mod I; SLP: NA Expected length of stay: 10-15 days Cultural Considerations: NA Dietary Needs: heart healthy, thin liquids Equipment Needs: TBD Special Service Needs: low vision strategies as pt is visually impaired and now using walker instead of normal weighted cane Pt/Family Agrees to Admission and willing to participate: Yes Program Orientation Provided & Reviewed with Pt/Caregiver Including Roles  & Responsibilities: Yes(with son, daughter in law, husband, and pt)  Barriers to Discharge: Home environment access/layout  Barriers to Discharge Comments: steps to enter   Decrease burden of Care through IP rehab admission: NA   Possible need for SNF placement upon discharge: Not anticipated; pt has good social support and has good prognosis for further progress.    Patient Condition: This patient's condition remains as documented in the consult dated 03/24/18, in which the Rehabilitation Physician determined and documented that the patient's condition is appropriate for intensive rehabilitative care in an inpatient rehabilitation facility. Will admit to inpatient rehab today.  Preadmission Screen Completed By:  Jhonnie Garner, 03/24/2018 4:46 PM ______________________________________________________________________   Discussed status with Dr. Naaman Plummer on 03/24/18 at 4:46PM and received telephone approval for admission  today.  Admission Coordinator:  Jhonnie Garner, time 4:46PM/Date 03/24/18

## 2018-03-23 NOTE — Progress Notes (Signed)
PROGRESS NOTE    Crystal [name]  XIP:[id number] DOB: 1945/09/12 DOA: 03/18/2018 PCP: [name], MD      Brief Narrative:  Crystal [name] is a 72 y.o. F with pAF on Eliquis, GCA now blind, HTN and PVD who presents with a chemical fall and right hip fracture as well as atrial fibrillation with RVR.   Assessment & Plan:  Atrial fibrillation with RVR Loaded with IV amiodarone, digoxin.  Still in A. fib, rates 80s to 110s overnight.  Oxygen has been stopped.  Or amiodarone has been converted to oral.  TSH minimally elevated, free T4 normal.  Potassium and magnesium replete. -Continue amiodarone -Hold theophylline -Titrate up metoprolol -Hold home diltiazem -Continue Eliquis   Right hip fracture Status post ORIF of 10/8. -Consult orthopedics, appreciate cares  Pseudohyperkalemia Hemolyzed blood  Hypertension Soft blood pressure this morning, 90/60 briefly, improved to 130s by now. -Hold home furosemide and diltiazem  Giant cell arteritis -Agree with cardiology, stress dose steroids for 3 days  COPD No active disease -Continue home inhalers      DVT prophylaxis: N/A on Eliquis Code Status: FULL Family Communication: None present MDM and disposition Plan: The below labs and imaging reports were reviewed and summarized above.  Medication management as above.  The patient was admitted with right hip fracutre and Afib with RVR.  She is medically complex with atrial fibrillation requiring active rate control titration, GCA on chronic steroids, with stress dosing, and blindness. Given her baseline functional status being excellent/independent, and her baseline blindness complicating her rehab efforts, She is appropriate for inpatient rehab.   Consultants:   ORthopedics  Cardiology  Procedures:   ORIF 10/8  Antimicrobials:   None    Subjective: No new fever, cough, sputum production.  No dyspnea.  No chest pain.  No orthopnea, leg swelling.  No  palpitations.  Objective: Vitals:   03/23/18 0400 03/23/18 0731 03/23/18 0800 03/23/18 0937  BP: (!) 90/59  103/61 132/88  Pulse:   97 (!) 104  Resp: 18 15 17    Temp: (!) 87.8 F (31 C)  97.6 F (36.4 C)   TempSrc: Oral  Oral   SpO2:  96%    Weight:      Height:        Intake/Output Summary (Last 24 hours) at 03/23/2018 1036 Last data filed at 03/22/2018 1700 Gross per 24 hour  Intake -  Output 600 ml  Net -600 ml   Filed Weights   03/18/18 0627 03/21/18 1117  Weight: 50.8 kg 50 kg    Examination: General appearance: Thin frail elderly adult female, alert and in no acute distress distress.  Up in bed. HEENT: Anicteric, conjunctiva pink, lids and lashes normal mild amblyopia. No nasal deformity, discharge, epistaxis.  Lips moist, dentition normal, oropharynx moist, no oral lesions, hearing normal.   Skin: Warm and dry.  no jaundice.  No suspicious rashes or lesions. Cardiac: Tachycardic, irregular, nl Q7-H4, colic murmur noted.  Capillary refill is brisk.  JVP not visible.  No LE edema.  Radia pulses 2+ and symmetric. Respiratory: Normal respiratory rate and rhythm.  CTAB without rales or wheezes. Abdomen: Abdomen soft.  no TTP. No ascites, distension, hepatosplenomegaly.   MSK: No deformities or effusions. Neuro: Awake and alert.  EOMI, moves all extremities. Speech fluent.   Blind. Psych: Sensorium intact and responding to questions, attention normal. Affect normal.  Judgment and insight appear normal.    Data Reviewed: I have personally reviewed following labs and  imaging studies:  CBC: Recent Labs  Lab 03/18/18 0652 03/19/18 0505 03/20/18 0329 03/21/18 0227 03/23/18 0254  WBC 12.3* 15.6* 11.8* 11.1* 8.4  NEUTROABS 8.8*  --   --   --  5.4  HGB 14.3 13.2 12.3 12.4 12.0  HCT 43.1 40.4 37.8 38.9 38.6  MCV 107.8* 110.7* 110.2* 111.8* 113.2*  PLT 289 247 228 211 762   Basic Metabolic Panel: Recent Labs  Lab 03/18/18 0652 03/18/18 1432 03/20/18 0329  03/21/18 0227 03/22/18 0813 03/22/18 1102 03/23/18 0254  NA 137  --  134* 135 134* 136 135  K 3.8  --  3.2* 4.7 6.4* 4.4 5.4*  CL 94*  --  96* 101 105 101 105  CO2 29  --  27 26 17* 24 24  GLUCOSE 81  --  83 75 81 98 93  BUN 14  --  11 8 8  5* 14  CREATININE 0.74  --  0.70 0.62 0.64 0.70 0.95  CALCIUM 9.8  --  8.7* 8.6* 8.6* 8.7* 8.8*  MG 1.9 1.7  --  2.2 2.2  --   --    GFR: Estimated Creatinine Clearance: 41 mL/min (by C-G formula based on SCr of 0.95 mg/dL). Liver Function Tests: Recent Labs  Lab 03/18/18 0652  AST 20  ALT 24  ALKPHOS 53  BILITOT 1.0  PROT 6.4*  ALBUMIN 4.0   No results for input(s): LIPASE, AMYLASE in the last 168 hours. No results for input(s): AMMONIA in the last 168 hours. Coagulation Profile: Recent Labs  Lab 03/18/18 0652  INR 0.91   Cardiac Enzymes: No results for input(s): CKTOTAL, CKMB, CKMBINDEX, TROPONINI in the last 168 hours. BNP (last 3 results) No results for input(s): PROBNP in the last 8760 hours. HbA1C: No results for input(s): HGBA1C in the last 72 hours. CBG: No results for input(s): GLUCAP in the last 168 hours. Lipid Profile: No results for input(s): CHOL, HDL, LDLCALC, TRIG, CHOLHDL, LDLDIRECT in the last 72 hours. Thyroid Function Tests: Recent Labs    03/21/18 1610 03/22/18 1102  TSH 5.142*  --   FREET4  --  1.04  T3FREE  --  0.9*   Anemia Panel: Recent Labs    03/22/18 0305  VITAMINB12 265  FOLATE 8.0   Urine analysis: No results found for: COLORURINE, APPEARANCEUR, LABSPEC, PHURINE, GLUCOSEU, HGBUR, BILIRUBINUR, KETONESUR, PROTEINUR, UROBILINOGEN, NITRITE, LEUKOCYTESUR Sepsis Labs: @LABRCNTIP (procalcitonin:4,lacticacidven:4)  ) Recent Results (from the past 240 hour(s))  MRSA PCR Screening     Status: None   Collection Time: 03/19/18  4:35 AM  Result Value Ref Range Status   MRSA by PCR NEGATIVE NEGATIVE Final    Comment:        The GeneXpert MRSA Assay (FDA approved for NASAL specimens only), is  one component of a comprehensive MRSA colonization surveillance program. It is not intended to diagnose MRSA infection nor to guide or monitor treatment for MRSA infections. Performed at [facility], [place] [address] [place], [place] [address]          Radiology Studies: Pelvis Portable  Result Date: 03/21/2018 CLINICAL DATA:  72 year old female with hip fracture. Subsequent encounter. EXAM: PORTABLE PELVIS 1-2 VIEWS COMPARISON:  03/21/2018 intraoperative films. FINDINGS: Three pins traverse right subcapital fracture. Pins appear in appropriate position on this frontal projection. IMPRESSION: Post pinning right femoral neck fracture. Electronically Signed   By: Genia Del M.D.   On: 03/21/2018 16:27   Dg C-arm 1-60 Min  Result Date: 03/21/2018 CLINICAL DATA:  Surgical internal fixation of right hip fracture. EXAM: OPERATIVE right HIP (WITH PELVIS IF PERFORMED) 3 VIEWS TECHNIQUE: Fluoroscopic spot image(s) were submitted for interpretation post-operatively. FLUOROSCOPY TIME:  1 minutes 34 seconds. COMPARISON:  Radiographs of March 18, 2018. FINDINGS: Three intraoperative fluoroscopic images of the right hip demonstrate the patient be status post surgical internal fixation of proximal right femoral neck fracture. Good alignment of fracture components is noted. IMPRESSION: Status post surgical internal fixation of proximal right femoral neck fracture. Electronically Signed   By: Marijo Conception, M.D.   On: 03/21/2018 14:46   Dg Hip Operative Unilat W Or W/o Pelvis Right  Result Date: 03/21/2018 CLINICAL DATA:  Surgical internal fixation of right hip fracture. EXAM: OPERATIVE right HIP (WITH PELVIS IF PERFORMED) 3 VIEWS TECHNIQUE: Fluoroscopic spot image(s) were submitted for interpretation post-operatively. FLUOROSCOPY TIME:  1 minutes 34 seconds. COMPARISON:  Radiographs of March 18, 2018. FINDINGS: Three intraoperative fluoroscopic images of the right hip demonstrate the patient  be status post surgical internal fixation of proximal right femoral neck fracture. Good alignment of fracture components is noted. IMPRESSION: Status post surgical internal fixation of proximal right femoral neck fracture. Electronically Signed   By: Marijo Conception, M.D.   On: 03/21/2018 14:46        Scheduled Meds: . amiodarone  200 mg Oral BID  . apixaban  5 mg Oral BID  . calcium-vitamin D  1 tablet Oral Q breakfast  . cholecalciferol  3,000 Units Oral Q lunch  . docusate sodium  100 mg Oral BID  . gabapentin  500 mg Oral QHS  . LORazepam  1 mg Oral QHS  . metoprolol tartrate  25 mg Oral Once  . metoprolol tartrate  50 mg Oral BID  . mometasone-formoterol  2 puff Inhalation BID  . montelukast  10 mg Oral QHS  . predniSONE  15 mg Oral Q breakfast  . saccharomyces boulardii  250 mg Oral Daily  . senna  1 tablet Oral BID   Continuous Infusions: . lactated ringers       LOS: 5 days    Time spent: 35 minutes    Edwin Dada, MD [facility] 03/23/2018, 10:36 AM     Pager [phone] --- please page though AMION:  [email or web address] If 7PM-7AM, please contact night-coverage

## 2018-03-23 NOTE — Consult Note (Signed)
Physical Medicine and Rehabilitation Consult Reason for Consult: Decreased functional mobility Referring Physician: [facility]   HPI: Crystal [name] is a 72 y.o. right-handed female with history of atrial fibrillation maintained on Eliquis, temporal arteritis/legally blind and maintained on chronic prednisone, hypertension.  Per chart review patient lives with spouse.  Multilevel home with bedroom on [address].  2 steps to entry.  Reported to be using a cane prior to admission and needing assistance due to blindness.  They have a son in the area that works.  Husband can assist but also uses a cane.  Presented 03/18/2018 after mechanical fall.  No loss of consciousness.  Cranial CT scan as well as CT cervical spine negative.  X-rays and imaging revealed right femoral neck fracture.  Preoperative clearance by cardiology services and changed to IV heparin with Eliquis held with plan for surgical intervention.  Underwent percutaneous screw fixation 03/21/2018 per Dr. Lyla Glassing.  Hospital course pain management.  Weightbearing as tolerated right lower extremity.  Eliquis is since been resumed as well as maintained on amiodarone.  Therapy evaluations completed with recommendations of physical medicine rehab consult   Review of Systems  Constitutional: Negative for chills and fever.  HENT: Negative for hearing loss.   Eyes: Negative for blurred vision and double vision.       Blindness  Respiratory: Negative for cough and shortness of breath.   Cardiovascular: Positive for palpitations.  Gastrointestinal: Positive for constipation. Negative for nausea and vomiting.  Genitourinary: Negative for dysuria and hematuria.  Musculoskeletal: Positive for falls and myalgias.  Skin: Negative for rash.  All other systems reviewed and are negative.  Past Medical History:  Diagnosis Date  . Adenomatous colon polyp 2007 & 2010    Dr [name]  . Allergy    seasonal  . Arthritis   . Cataract   . Diverticulosis   .  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 Hyperlipidemia   . Hypertension   . IBS (irritable bowel syndrome)   . Legally blind   . MVP (mitral valve prolapse)   . Paroxysmal atrial fibrillation with rapid ventricular response ([place]) 08/29/2017  . Peripheral vascular disease ([place])   . RAD (reactive airway disease)    Past Surgical History:  Procedure Laterality Date  . ABDOMINAL HYSTERECTOMY     with USO for dysfunctionall menses  . APPENDECTOMY    . ARTERY BIOPSY Bilateral 07/21/2017   Procedure: BIOPSY BILATERAL TEMPORAL ARTERY;  Surgeon: [name], MD;  Location: [facility];  Service: Vascular;  Laterality: Bilateral;  . CATARACT EXTRACTION W/ INTRAOCULAR LENS IMPLANT  2013   bilateral; Dr [name]  . COLONOSCOPY  2013   negative , due 2018; Dr [name]  . COLONOSCOPY W/ POLYPECTOMY      X2 ; diverticulosis  . EMBOLECTOMY Right 08/29/2017   Procedure: Right Groin Exploration  ;  Surgeon: Rosetta Posner, MD;  Location: [facility];  Service: Vascular;  Laterality: Right;  . HIP PINNING,CANNULATED Right 03/21/2018  . HIP PINNING,CANNULATED Right 03/21/2018   Procedure: CANNULATED HIP PINNING;  Surgeon: Rod Can, MD;  Location: [facility];  Service: Orthopedics;  Laterality: Right;  . LUMBAR LAMINECTOMY  2000   Dr Vertell Limber  . SHOULDER SURGERY     R shoulder  . TONSILLECTOMY     Family History  Problem Relation Age of Onset  . Heart attack Brother 55  . Parkinsonism Mother   . Stroke Mother   . Heart attack Father  pre 55  . Diabetes Sister   . COPD Sister        X 3  . Other Son        suicide 2016  . Colon cancer Neg Hx   . Cancer Neg Hx   . Esophageal cancer Neg Hx   . Liver cancer Neg Hx   . Pancreatic cancer Neg Hx   . Rectal cancer Neg Hx   . Stomach cancer Neg Hx    Social History:  reports that she quit smoking about 28 years ago. She has a 23.00 pack-year smoking history. She has never used smokeless tobacco. She reports that she drinks about 14.0  standard drinks of alcohol per week. She reports that she does not use drugs. Allergies:  Allergies  Allergen Reactions  . Simvastatin     myalgias   Medications Prior to Admission  Medication Sig Dispense Refill  . acetaminophen (TYLENOL) 500 MG tablet Take 1,000 mg by mouth every 6 (six) hours as needed for headache.    . alendronate (FOSAMAX) 70 MG tablet Take 70 mg by mouth once a week. On Friday  0  . apixaban (ELIQUIS) 5 MG TABS tablet Take 1 tablet (5 mg total) by mouth 2 (two) times daily. 60 tablet 6  . Calcium Carbonate-Vitamin D (CALCIUM 600/VITAMIN D PO) Take 1 tablet by mouth daily.     Marland Kitchen CARTIA XT 120 MG 24 hr capsule Take 120 mg by mouth daily.   10  . Cholecalciferol (VITAMIN D3) 1000 UNITS CAPS Take 1,000 Units by mouth daily with lunch.     . diltiazem (CARDIZEM) 30 MG tablet Take 1 tablet (30 mg total) by mouth once as needed for up to 1 dose. (Patient taking differently: Take 30 mg by mouth once as needed (for afib). ) 14 tablet 0  . furosemide (LASIX) 40 MG tablet TAKE ONE TABLET BY MOUTH DAILY (Patient taking differently: Take 40 mg by mouth daily. ) 90 tablet 3  . gabapentin (NEURONTIN) 100 MG capsule Take 800-900 mg by mouth at bedtime.   0  . LORazepam (ATIVAN) 1 MG tablet Take 1 tablet (1 mg total) by mouth at bedtime. 30 tablet 2  . montelukast (SINGULAIR) 10 MG tablet Take 1 tablet (10 mg total) by mouth daily. 90 tablet 1  . potassium chloride SA (K-DUR,KLOR-CON) 20 MEQ tablet Take 2 tablets (40 mEq total) by mouth daily. 60 tablet 5  . pravastatin (PRAVACHOL) 40 MG tablet TAKE 1 TABLET DAILY BY MOUTH AT BEDTIME (Patient taking differently: Take 40 mg by mouth at bedtime. ) 90 tablet 1  . predniSONE (DELTASONE) 5 MG tablet Take 7.5 mg by mouth daily with breakfast.    . saccharomyces boulardii (FLORASTOR) 250 MG capsule Take 250 mg by mouth daily.    . SYMBICORT 160-4.5 MCG/ACT inhaler INHALE 1 OR 2 PUFFS INTO THE LUNGS EVERY 12 HOURS. THEN GARGLE AND SPIT AFTER  USE (Patient taking differently: Inhale 2 puffs into the lungs 2 (two) times daily. ) 6 g 10  . theophylline (UNIPHYL) 400 MG 24 hr tablet TAKE 1 TABLET (400 MG TOTAL) BY MOUTH DAILY. (Patient taking differently: Take 200 mg by mouth 2 (two) times daily. ) 90 tablet 3  . traMADol (ULTRAM) 50 MG tablet Take 1-2 tablets (50-100 mg total) by mouth every 8 (eight) hours as needed. (Patient taking differently: Take 50 mg by mouth every 8 (eight) hours as needed for moderate pain. ) 60 tablet 0  . VENTOLIN HFA  108 (90 Base) MCG/ACT inhaler Inhale 1-2 puffs into the lungs every 6 (six) hours as needed for wheezing or shortness of breath. 1 Inhaler 8    Home: Home Living Family/patient expects to be discharged to:: Private residence Living Arrangements: Spouse/significant other Available Help at Discharge: Family, Available 24 hours/day Type of Home: House Home Access: Stairs to enter [employer] of Steps: 2 steeper steps in garage with rail, 2-3 smaller steps no rail in front Entrance Stairs-Rails: Left Home Layout: Able to live on main level with bedroom/bathroom, Multi-level Alternate Level Stairs-Number of Steps: can stay on main Bathroom Shower/Tub: Multimedia programmer: Standard Home Equipment: Other (comment)(white cane)  Functional History: Prior Function Level of Independence: Independent Comments: Pt was performing ADLs independently. Husband assisting with IADLs. Recently started OT to adapt and modify home environment for vision loss. Functional Status:  Mobility: Bed Mobility Overal bed mobility: Needs Assistance Bed Mobility: Supine to Sit Supine to sit: Mod assist General bed mobility comments: assist for scooting and lifting trunk with cues for technique Transfers Overall transfer level: Needs assistance Equipment used: Rolling walker (2 wheeled) Transfers: Sit to/from Stand, Stand Pivot Transfers Sit to Stand: Mod assist Stand pivot transfers: Min  assist General transfer comment: some lifting assist with mod cues for technique from EOB and from 3:1; pivot to 3:1 with RW, cues and assist for balance Ambulation/Gait Ambulation/Gait assistance: Min assist, +2 safety/equipment Gait Distance (Feet): 26 Feet Assistive device: Rolling walker (2 wheeled) Gait Pattern/deviations: Step-to pattern, Decreased stride length, Antalgic General Gait Details: limited tolerance to weight on R LE, cues and assist for walker management and positioning, son and daughter present and engaged to assist as well    ADL: ADL Overall ADL's : Needs assistance/impaired Eating/Feeding: With caregiver independent assisting, Sitting, Set up(Cues for placement) Eating/Feeding Details (indicate cue type and reason): Husband assisting to prep food due to vision deficits Grooming: Set up, Supervision/safety, Sitting Upper Body Bathing: Set up, Supervision/ safety, Sitting Lower Body Bathing: Minimal assistance, Sit to/from stand Upper Body Dressing : Set up, Supervision/safety Lower Body Dressing: Moderate assistance, Sit to/from stand Lower Body Dressing Details (indicate cue type and reason): Mod A for donning clothing over RLE and manage over hips. Min A for sit<>stand Toilet Transfer: Minimal assistance, 29, RW, Cueing for sequencing Toilet Transfer Details (indicate cue type and reason): Min A for power up into standing and then maintain balance during pivot. Cues for sequencign and RW management Functional mobility during ADLs: Minimal assistance, Rolling walker General ADL Comments: Pt with decreased functional performance due to pain at RLE and vision deficits. Pt motivated to return home and increased independence  Cognition: Cognition Overall Cognitive Status: Within Functional Limits for tasks assessed Orientation Level: Oriented X4 Cognition Arousal/Alertness: Awake/alert Behavior During Therapy: WFL for tasks assessed/performed Overall  Cognitive Status: Within Functional Limits for tasks assessed General Comments: Very motivated  Blood pressure (!) 90/59, pulse 100, temperature (!) 87.8 F (31 C), temperature source Oral, resp. rate 15, height 5\' 3"  (1.6 m), weight 50 kg, SpO2 96 %. Physical Exam  Constitutional: She is oriented to person, place, and time. She appears well-developed. No distress.  HENT:  Head: Normocephalic.  Eyes: Pupils are equal, round, and reactive to light.  Totally blind  Neck: Normal range of motion.  Cardiovascular: Regular rhythm.  tachycardic  Respiratory: Effort normal.  GI: Soft.  Musculoskeletal:  Edema and tenderness right hip  Neurological: She is alert and oriented to person, place, and time.  Patient  is alert.  Follows full commands.  Oriented to person, place and time.  Can not see even light or dark. UE 5/5. RLE limited by pain. LLE 4/5. No sensory deficits  Skin: Skin is warm.  Bruising right hip, wound with foam dressing  Psychiatric: She has a normal mood and affect. Her behavior is normal.    Results for orders placed or performed during the hospital encounter of 03/18/18 (from the past 24 hour(s))  Basic metabolic panel     Status: Abnormal   Collection Time: 03/22/18  8:13 AM  Result Value Ref Range   Sodium 134 (L) 135 - 145 mmol/L   Potassium 6.4 (HH) 3.5 - 5.1 mmol/L   Chloride 105 98 - 111 mmol/L   CO2 17 (L) 22 - 32 mmol/L   Glucose, Bld 81 70 - 99 mg/dL   BUN 8 8 - 23 mg/dL   Creatinine, Ser 0.64 0.44 - 1.00 mg/dL   Calcium 8.6 (L) 8.9 - 10.3 mg/dL   GFR calc non Af Amer >60 >60 mL/min   GFR calc Af Amer >60 >60 mL/min   Anion gap 12 5 - 15  Magnesium     Status: None   Collection Time: 03/22/18  8:13 AM  Result Value Ref Range   Magnesium 2.2 1.7 - 2.4 mg/dL  T4, free     Status: None   Collection Time: 03/22/18 11:02 AM  Result Value Ref Range   Free T4 1.04 0.82 - 1.77 ng/dL  T3, free     Status: Abnormal   Collection Time: 03/22/18 11:02 AM    Result Value Ref Range   T3, Free 0.9 (L) 2.0 - 4.4 pg/mL  Basic metabolic panel     Status: Abnormal   Collection Time: 03/22/18 11:02 AM  Result Value Ref Range   Sodium 136 135 - 145 mmol/L   Potassium 4.4 3.5 - 5.1 mmol/L   Chloride 101 98 - 111 mmol/L   CO2 24 22 - 32 mmol/L   Glucose, Bld 98 70 - 99 mg/dL   BUN 5 (L) 8 - 23 mg/dL   Creatinine, Ser 0.70 0.44 - 1.00 mg/dL   Calcium 8.7 (L) 8.9 - 10.3 mg/dL   GFR calc non Af Amer >60 >60 mL/min   GFR calc Af Amer >60 >60 mL/min   Anion gap 11 5 - 15  Basic metabolic panel     Status: Abnormal   Collection Time: 03/23/18  2:54 AM  Result Value Ref Range   Sodium 135 135 - 145 mmol/L   Potassium 5.4 (H) 3.5 - 5.1 mmol/L   Chloride 105 98 - 111 mmol/L   CO2 24 22 - 32 mmol/L   Glucose, Bld 93 70 - 99 mg/dL   BUN 14 8 - 23 mg/dL   Creatinine, Ser 0.95 0.44 - 1.00 mg/dL   Calcium 8.8 (L) 8.9 - 10.3 mg/dL   GFR calc non Af Amer 58 (L) >60 mL/min   GFR calc Af Amer >60 >60 mL/min   Anion gap 6 5 - 15  CBC with Differential/Platelet     Status: Abnormal   Collection Time: 03/23/18  2:54 AM  Result Value Ref Range   WBC 8.4 4.0 - 10.5 K/uL   RBC 3.41 (L) 3.87 - 5.11 MIL/uL   Hemoglobin 12.0 12.0 - 15.0 g/dL   HCT 38.6 36.0 - 46.0 %   MCV 113.2 (H) 80.0 - 100.0 fL   MCH 35.2 (H) 26.0 - 34.0 pg  MCHC 31.1 30.0 - 36.0 g/dL   RDW 16.7 (H) 11.5 - 15.5 %   Platelets 219 150 - 400 K/uL   nRBC 0.0 0.0 - 0.2 %   Neutrophils Relative % 64 %   Neutro Abs 5.4 1.7 - 7.7 K/uL   Lymphocytes Relative 22 %   Lymphs Abs 1.8 0.7 - 4.0 K/uL   Monocytes Relative 12 %   Monocytes Absolute 1.0 0.1 - 1.0 K/uL   Eosinophils Relative 1 %   Eosinophils Absolute 0.1 0.0 - 0.5 K/uL   Basophils Relative 1 %   Basophils Absolute 0.1 0.0 - 0.1 K/uL   nRBC 0 0 /100 WBC   Polychromasia PRESENT    Pelvis Portable  Result Date: 03/21/2018 CLINICAL DATA:  72 year old female with hip fracture. Subsequent encounter. EXAM: PORTABLE PELVIS 1-2 VIEWS  COMPARISON:  03/21/2018 intraoperative films. FINDINGS: Three pins traverse right subcapital fracture. Pins appear in appropriate position on this frontal projection. IMPRESSION: Post pinning right femoral neck fracture. Electronically Signed   By: Genia Del M.D.   On: 03/21/2018 16:27   Dg C-arm 1-60 Min  Result Date: 03/21/2018 CLINICAL DATA:  Surgical internal fixation of right hip fracture. EXAM: OPERATIVE right HIP (WITH PELVIS IF PERFORMED) 3 VIEWS TECHNIQUE: Fluoroscopic spot image(s) were submitted for interpretation post-operatively. FLUOROSCOPY TIME:  1 minutes 34 seconds. COMPARISON:  Radiographs of March 18, 2018. FINDINGS: Three intraoperative fluoroscopic images of the right hip demonstrate the patient be status post surgical internal fixation of proximal right femoral neck fracture. Good alignment of fracture components is noted. IMPRESSION: Status post surgical internal fixation of proximal right femoral neck fracture. Electronically Signed   By: Marijo Conception, M.D.   On: 03/21/2018 14:46   Dg Hip Operative Unilat W Or W/o Pelvis Right  Result Date: 03/21/2018 CLINICAL DATA:  Surgical internal fixation of right hip fracture. EXAM: OPERATIVE right HIP (WITH PELVIS IF PERFORMED) 3 VIEWS TECHNIQUE: Fluoroscopic spot image(s) were submitted for interpretation post-operatively. FLUOROSCOPY TIME:  1 minutes 34 seconds. COMPARISON:  Radiographs of March 18, 2018. FINDINGS: Three intraoperative fluoroscopic images of the right hip demonstrate the patient be status post surgical internal fixation of proximal right femoral neck fracture. Good alignment of fracture components is noted. IMPRESSION: Status post surgical internal fixation of proximal right femoral neck fracture. Electronically Signed   By: Marijo Conception, M.D.   On: 03/21/2018 14:46     Assessment/Plan: Diagnosis: Right femoral neck fracture after fall s/p percutaneous screw. Legally blind 1. Does the need for close, 24  hr/day medical supervision in concert with the patient's rehab needs make it unreasonable for this patient to be served in a less intensive setting? Yes 2. Co-Morbidities requiring supervision/potential complications: afib with RVR, temporal arteritis, HTN, hyperkalemia 3. Due to bladder management, bowel management, safety, skin/wound care, disease management, medication administration, pain management and patient education, does the patient require 24 hr/day rehab nursing? Yes 4. Does the patient require coordinated care of a physician, rehab nurse, PT (1-2 hrs/day, 5 days/week) and OT (1-2 hrs/day, 5 days/week) to address physical and functional deficits in the context of the above medical diagnosis(es)? Yes Addressing deficits in the following areas: balance, endurance, locomotion, strength, transferring, bowel/bladder control, bathing, dressing, feeding, grooming, toileting and psychosocial support 5. Can the patient actively participate in an intensive therapy program of at least 3 hrs of therapy per day at least 5 days per week? Yes 6. The potential for patient to make measurable gains while on inpatient  rehab is excellent 7. Anticipated functional outcomes upon discharge from inpatient rehab are modified independent and supervision  with PT, modified independent and supervision with OT, n/a with SLP. 8. Estimated rehab length of stay to reach the above functional goals is: 10-15 days 9. Anticipated D/C setting: Home 10. Anticipated post D/C treatments: HH therapy and Outpatient therapy 11. Overall Rehab/Functional Prognosis: excellent  RECOMMENDATIONS: This patient's condition is appropriate for continued rehabilitative care in the following setting: CIR Patient has agreed to participate in recommended program. Yes Note that insurance prior authorization may be required for reimbursement for recommended care.  Comment: Rehab Admissions Coordinator to follow up.  Thanks,   Meredith Staggers, MD, Mellody Drown  I have personally performed a face to face diagnostic evaluation of this patient. Additionally, I have reviewed and concur with the physician assistant's documentation above.    Lavon Paganini Angiulli, PA-C 03/23/2018

## 2018-03-23 NOTE — Progress Notes (Signed)
Inpatient Rehabilitation-Admissions Coordinator    Met with patient and family at the bedside to discuss team's recommendation for inpatient rehabilitation. Shared booklets, expectations while in CIR, expected length of stay, and anticipated functional level at DC. Pt and family wanting to pursue CIR at this time. I will start insurance authorization process for potential admit to CIR and follow for medical readiness.  Please call if questions.   Jhonnie Garner, OTR/L  Rehab Admissions Coordinator  [phone] 03/23/2018 1:49 PM

## 2018-03-23 NOTE — Care Management Important Message (Signed)
Important Message  Patient Details  Name: Crystal [name] MRN: [id number] Date of Birth: 1945/09/23   Medicare Important Message Given:  Yes    Smiley Birr P Kemper Hochman 03/23/2018, 4:20 PM

## 2018-03-24 ENCOUNTER — Encounter (HOSPITAL_COMMUNITY): Payer: Self-pay | Admitting: Nurse Practitioner

## 2018-03-24 ENCOUNTER — Inpatient Hospital Stay (HOSPITAL_COMMUNITY)
Admission: RE | Admit: 2018-03-24 | Discharge: 2018-03-30 | DRG: 561 | Disposition: A | Discharge status: Home / Self Care | Payer: Medicare Other | Source: Intra-hospital | Attending: Physical Medicine & Rehabilitation | Admitting: Physical Medicine & Rehabilitation

## 2018-03-24 ENCOUNTER — Other Ambulatory Visit: Payer: Self-pay

## 2018-03-24 DIAGNOSIS — M316 Other giant cell arteritis: Secondary | ICD-10-CM | POA: Diagnosis not present

## 2018-03-24 DIAGNOSIS — Z8249 Family history of ischemic heart disease and other diseases of the circulatory system: Secondary | ICD-10-CM

## 2018-03-24 DIAGNOSIS — K5900 Constipation, unspecified: Secondary | ICD-10-CM | POA: Diagnosis present

## 2018-03-24 DIAGNOSIS — H547 Unspecified visual loss: Secondary | ICD-10-CM | POA: Diagnosis present

## 2018-03-24 DIAGNOSIS — H543 Unqualified visual loss, both eyes: Secondary | ICD-10-CM | POA: Diagnosis not present

## 2018-03-24 DIAGNOSIS — Z961 Presence of intraocular lens: Secondary | ICD-10-CM | POA: Diagnosis present

## 2018-03-24 DIAGNOSIS — S72001D Fracture of unspecified part of neck of right femur, subsequent encounter for closed fracture with routine healing: Principal | ICD-10-CM

## 2018-03-24 DIAGNOSIS — Z86010 Personal history of colonic polyps: Secondary | ICD-10-CM

## 2018-03-24 DIAGNOSIS — Z8669 Personal history of other diseases of the nervous system and sense organs: Secondary | ICD-10-CM | POA: Diagnosis not present

## 2018-03-24 DIAGNOSIS — Z90710 Acquired absence of both cervix and uterus: Secondary | ICD-10-CM | POA: Diagnosis not present

## 2018-03-24 DIAGNOSIS — Z7952 Long term (current) use of systemic steroids: Secondary | ICD-10-CM | POA: Diagnosis not present

## 2018-03-24 DIAGNOSIS — S72009A Fracture of unspecified part of neck of unspecified femur, initial encounter for closed fracture: Secondary | ICD-10-CM | POA: Diagnosis present

## 2018-03-24 DIAGNOSIS — Z825 Family history of asthma and other chronic lower respiratory diseases: Secondary | ICD-10-CM | POA: Diagnosis not present

## 2018-03-24 DIAGNOSIS — I4819 Other persistent atrial fibrillation: Secondary | ICD-10-CM

## 2018-03-24 DIAGNOSIS — Z888 Allergy status to other drugs, medicaments and biological substances status: Secondary | ICD-10-CM | POA: Diagnosis not present

## 2018-03-24 DIAGNOSIS — I10 Essential (primary) hypertension: Secondary | ICD-10-CM | POA: Diagnosis not present

## 2018-03-24 DIAGNOSIS — W010XXD Fall on same level from slipping, tripping and stumbling without subsequent striking against object, subsequent encounter: Secondary | ICD-10-CM | POA: Diagnosis present

## 2018-03-24 DIAGNOSIS — J45909 Unspecified asthma, uncomplicated: Secondary | ICD-10-CM | POA: Diagnosis present

## 2018-03-24 DIAGNOSIS — Z7901 Long term (current) use of anticoagulants: Secondary | ICD-10-CM | POA: Diagnosis not present

## 2018-03-24 DIAGNOSIS — Z9841 Cataract extraction status, right eye: Secondary | ICD-10-CM | POA: Diagnosis not present

## 2018-03-24 DIAGNOSIS — S72001A Fracture of unspecified part of neck of right femur, initial encounter for closed fracture: Secondary | ICD-10-CM | POA: Diagnosis not present

## 2018-03-24 DIAGNOSIS — I482 Chronic atrial fibrillation, unspecified: Secondary | ICD-10-CM | POA: Diagnosis not present

## 2018-03-24 DIAGNOSIS — I4891 Unspecified atrial fibrillation: Secondary | ICD-10-CM | POA: Diagnosis present

## 2018-03-24 DIAGNOSIS — E039 Hypothyroidism, unspecified: Secondary | ICD-10-CM | POA: Diagnosis present

## 2018-03-24 DIAGNOSIS — Z833 Family history of diabetes mellitus: Secondary | ICD-10-CM | POA: Diagnosis not present

## 2018-03-24 DIAGNOSIS — Z7983 Long term (current) use of bisphosphonates: Secondary | ICD-10-CM

## 2018-03-24 DIAGNOSIS — Z820 Family history of epilepsy and other diseases of the nervous system: Secondary | ICD-10-CM

## 2018-03-24 DIAGNOSIS — Z9842 Cataract extraction status, left eye: Secondary | ICD-10-CM | POA: Diagnosis not present

## 2018-03-24 DIAGNOSIS — E875 Hyperkalemia: Secondary | ICD-10-CM

## 2018-03-24 DIAGNOSIS — I480 Paroxysmal atrial fibrillation: Secondary | ICD-10-CM

## 2018-03-24 DIAGNOSIS — Z87891 Personal history of nicotine dependence: Secondary | ICD-10-CM | POA: Diagnosis not present

## 2018-03-24 DIAGNOSIS — R2689 Other abnormalities of gait and mobility: Secondary | ICD-10-CM | POA: Diagnosis not present

## 2018-03-24 DIAGNOSIS — S72009S Fracture of unspecified part of neck of unspecified femur, sequela: Secondary | ICD-10-CM | POA: Diagnosis not present

## 2018-03-24 DIAGNOSIS — I5032 Chronic diastolic (congestive) heart failure: Secondary | ICD-10-CM

## 2018-03-24 DIAGNOSIS — Z823 Family history of stroke: Secondary | ICD-10-CM | POA: Diagnosis not present

## 2018-03-24 LAB — CBC
HCT: 35.7 % — ABNORMAL LOW (ref 36.0–46.0)
Hemoglobin: 11.4 g/dL — ABNORMAL LOW (ref 12.0–15.0)
MCH: 35.4 pg — ABNORMAL HIGH (ref 26.0–34.0)
MCHC: 31.9 g/dL (ref 30.0–36.0)
MCV: 110.9 fL — ABNORMAL HIGH (ref 80.0–100.0)
Platelets: 250 10*3/uL (ref 150–400)
RBC: 3.22 MIL/uL — ABNORMAL LOW (ref 3.87–5.11)
RDW: 16.3 % — ABNORMAL HIGH (ref 11.5–15.5)
WBC: 7.7 10*3/uL (ref 4.0–10.5)
nRBC: 0 % (ref 0.0–0.2)

## 2018-03-24 LAB — BASIC METABOLIC PANEL
Anion gap: 11 (ref 5–15)
BUN: 24 mg/dL — ABNORMAL HIGH (ref 8–23)
CO2: 23 mmol/L (ref 22–32)
Calcium: 8.8 mg/dL — ABNORMAL LOW (ref 8.9–10.3)
Chloride: 99 mmol/L (ref 98–111)
Creatinine, Ser: 0.99 mg/dL (ref 0.44–1.00)
GFR calc Af Amer: >60 mL/min (ref >60)
GFR calc non Af Amer: 55 mL/min — ABNORMAL LOW (ref >60)
Glucose, Bld: 79 mg/dL (ref 70–99)
Potassium: 5.2 mmol/L — ABNORMAL HIGH (ref 3.5–5.1)
Sodium: 133 mmol/L — ABNORMAL LOW (ref 135–145)

## 2018-03-24 MED ORDER — AMIODARONE HCL 200 MG PO TABS: 200.0000 mg | ORAL_TABLET | Freq: Every day | ORAL | Status: DC

## 2018-03-24 MED ORDER — DOCUSATE SODIUM 100 MG PO CAPS
100.0000 mg | ORAL_CAPSULE | Freq: Two times a day (BID) | ORAL | Status: DC
  Administered 2018-03-24 – 2018-03-30 (×6): 100 mg via ORAL
  Filled 2018-03-24 (×10): qty 1

## 2018-03-24 MED ORDER — SENNA 8.6 MG PO TABS
1.0000 | ORAL_TABLET | Freq: Two times a day (BID) | ORAL | Status: DC
  Administered 2018-03-24 – 2018-03-30 (×5): 8.6 mg via ORAL
  Filled 2018-03-24 (×10): qty 1

## 2018-03-24 MED ORDER — VITAMIN D3 25 MCG (1000 UNIT) PO TABS
3000.0000 [IU] | ORAL_TABLET | Freq: Every day | ORAL | Status: DC
  Administered 2018-03-25 – 2018-03-29 (×5): 3000 [IU] via ORAL
  Filled 2018-03-24 (×11): qty 3

## 2018-03-24 MED ORDER — METOPROLOL TARTRATE 50 MG PO TABS
50.0000 mg | ORAL_TABLET | Freq: Two times a day (BID) | ORAL | Status: DC
  Administered 2018-03-24 – 2018-03-25 (×2): 50 mg via ORAL
  Filled 2018-03-24 (×3): qty 1

## 2018-03-24 MED ORDER — TRAMADOL HCL 50 MG PO TABS
50.0000 mg | ORAL_TABLET | Freq: Four times a day (QID) | ORAL | Status: DC | PRN
  Administered 2018-03-25 – 2018-03-30 (×8): 50 mg via ORAL
  Filled 2018-03-24 (×9): qty 1

## 2018-03-24 MED ORDER — OXYCODONE HCL 5 MG PO TABS
5.0000 mg | ORAL_TABLET | ORAL | Status: DC | PRN
  Administered 2018-03-24 – 2018-03-30 (×21): 10 mg via ORAL
  Filled 2018-03-24 (×21): qty 2

## 2018-03-24 MED ORDER — PREDNISONE 5 MG PO TABS
15.0000 mg | ORAL_TABLET | Freq: Every day | ORAL | Status: DC
  Administered 2018-03-25 – 2018-03-26 (×2): 15 mg via ORAL
  Filled 2018-03-24 (×3): qty 3

## 2018-03-24 MED ORDER — LEVOTHYROXINE SODIUM 25 MCG PO TABS: 25.0000 ug | ORAL_TABLET | Freq: Every day | ORAL | Status: DC

## 2018-03-24 MED ORDER — GABAPENTIN 400 MG PO CAPS
500.0000 mg | ORAL_CAPSULE | Freq: Every day | ORAL | Status: DC
  Administered 2018-03-24 – 2018-03-29 (×6): 500 mg via ORAL
  Filled 2018-03-24 (×6): qty 1

## 2018-03-24 MED ORDER — SENNA 8.6 MG PO TABS: 1.0000 | ORAL_TABLET | Freq: Two times a day (BID) | ORAL | 0 refills | Status: DC

## 2018-03-24 MED ORDER — OXYCODONE HCL 5 MG PO TABS: 5.0000 mg | ORAL_TABLET | Freq: Four times a day (QID) | ORAL | 0 refills | Status: DC | PRN

## 2018-03-24 MED ORDER — CALCIUM CARBONATE-VITAMIN D 500-200 MG-UNIT PO TABS
1.0000 | ORAL_TABLET | Freq: Every day | ORAL | Status: DC
  Administered 2018-03-25 – 2018-03-30 (×6): 1 via ORAL
  Filled 2018-03-24 (×6): qty 1

## 2018-03-24 MED ORDER — MOMETASONE FURO-FORMOTEROL FUM 200-5 MCG/ACT IN AERO
2.0000 | INHALATION_SPRAY | Freq: Two times a day (BID) | RESPIRATORY_TRACT | Status: DC
  Administered 2018-03-25 – 2018-03-30 (×11): 2 via RESPIRATORY_TRACT
  Filled 2018-03-24: qty 8.8

## 2018-03-24 MED ORDER — AMIODARONE HCL 200 MG PO TABS: 200.0000 mg | ORAL_TABLET | Freq: Two times a day (BID) | ORAL | 0 refills | Status: DC

## 2018-03-24 MED ORDER — LEVOTHYROXINE SODIUM 25 MCG PO TABS: 25.0000 ug | ORAL_TABLET | Freq: Every day | ORAL | 0 refills | Status: DC

## 2018-03-24 MED ORDER — LORAZEPAM 1 MG PO TABS
1.0000 mg | ORAL_TABLET | Freq: Every day | ORAL | Status: DC
  Administered 2018-03-24 – 2018-03-29 (×6): 1 mg via ORAL
  Filled 2018-03-24 (×6): qty 1

## 2018-03-24 MED ORDER — APIXABAN 5 MG PO TABS
5.0000 mg | ORAL_TABLET | Freq: Two times a day (BID) | ORAL | Status: DC
  Administered 2018-03-24 – 2018-03-30 (×12): 5 mg via ORAL
  Filled 2018-03-24 (×12): qty 1

## 2018-03-24 MED ORDER — ACETAMINOPHEN 325 MG PO TABS
650.0000 mg | ORAL_TABLET | ORAL | Status: DC | PRN
  Administered 2018-03-28 – 2018-03-30 (×4): 650 mg via ORAL
  Filled 2018-03-24 (×5): qty 2

## 2018-03-24 MED ORDER — ONDANSETRON HCL 4 MG/2ML IJ SOLN: 4.0000 mg | Freq: Four times a day (QID) | INTRAMUSCULAR | Status: DC | PRN

## 2018-03-24 MED ORDER — MONTELUKAST SODIUM 10 MG PO TABS
10.0000 mg | ORAL_TABLET | Freq: Every day | ORAL | Status: DC
  Administered 2018-03-24 – 2018-03-29 (×6): 10 mg via ORAL
  Filled 2018-03-24 (×6): qty 1

## 2018-03-24 MED ORDER — AMIODARONE HCL 200 MG PO TABS
200.0000 mg | ORAL_TABLET | Freq: Two times a day (BID) | ORAL | Status: DC
  Administered 2018-03-24 – 2018-03-30 (×12): 200 mg via ORAL
  Filled 2018-03-24 (×12): qty 1

## 2018-03-24 MED ORDER — ONDANSETRON HCL 4 MG PO TABS: 4.0000 mg | ORAL_TABLET | Freq: Four times a day (QID) | ORAL | Status: DC | PRN

## 2018-03-24 MED ORDER — AMIODARONE HCL 200 MG PO TABS: 200.0000 mg | ORAL_TABLET | Freq: Every day | ORAL | 1 refills | Status: DC

## 2018-03-24 MED ORDER — ALBUTEROL SULFATE (2.5 MG/3ML) 0.083% IN NEBU: 2.5000 mg | INHALATION_SOLUTION | Freq: Four times a day (QID) | RESPIRATORY_TRACT | Status: DC | PRN

## 2018-03-24 MED ORDER — METOPROLOL TARTRATE 50 MG PO TABS: 50.0000 mg | ORAL_TABLET | Freq: Two times a day (BID) | ORAL | 1 refills | Status: DC

## 2018-03-24 NOTE — Progress Notes (Signed)
Physical Therapy Treatment Patient Details Name: Crystal [name] MRN: [id number] DOB: 1945-08-14 Today's Date: 03/24/2018    History of Present Illness 72 yo female presenting after mechanical fall at home and sustained right femoral neck fx. S/p percutaneous screw fixation and WBAT. 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     PT Comments    Patient continues to make excellent progress towards her physical therapy goals and demonstrates motivation to participate. Currently requiring min guard for transfers and min assist for ambulation with walker up to 30 feet. Maximal verbal/tactile/auditory cueing implemented for direction to promote safety with obstacle negotiation and path finding secondary to visual impairments. Patient continues to display decreased activity tolerance, right lower extremity strength/range of motion, and gait abnormalities compared to baseline. Gait speed indicative of high fall risk. Continue to highly recommend comprehensive inpatient rehab (CIR) for post-acute therapy needs.     Follow Up Recommendations  CIR     Equipment Recommendations  Rolling walker with 5" wheels    Recommendations for Other Services Rehab consult     Precautions / Restrictions Precautions Precautions: Fall Precaution Comments: Legally blind Restrictions Weight Bearing Restrictions: Yes RLE Weight Bearing: Weight bearing as tolerated    Mobility  Bed Mobility Overal bed mobility: Needs Assistance Bed Mobility: Supine to Sit     Supine to sit: Min guard Sit to supine: Min assist   General bed mobility comments: Patient able to elevate legs up ~75% and PT assisted with elevation the additional 25%. Use of bed rail. Tactile/auditory cueing provided for direction   Transfers Overall transfer level: Needs assistance Equipment used: Rolling walker (2 wheeled) Transfers: Sit to/from Stand Sit to Stand: Min guard Stand  pivot transfers: Min assist       General transfer comment: Mod verbal cueing for safe hand placement. Decreased RLE weightbearing  Ambulation/Gait Ambulation/Gait assistance: Min assist Gait Distance (Feet): 30 Feet x 2 (rest break in between) Assistive device: Rolling walker (2 wheeled) Gait Pattern/deviations: Step-to pattern;Decreased stride length;Antalgic;Step-through pattern;Decreased step length - left Gait velocity: decr Gait velocity interpretation: <1.31 ft/sec, indicative of household ambulator General Gait Details: Verbal cueing for increased RLE weightbearing, progression to step through pattern, and decreased right step length. Patient able to progress to step through pattern for ~25% of gait. Frequent manual assistance and cueing provided for steering and negotiating walker.    Stairs             Wheelchair Mobility    Modified Rankin (Stroke Patients Only)       Balance Overall balance assessment: Needs assistance Sitting-balance support: No upper extremity supported;Feet supported Sitting balance-Leahy Scale: Good     Standing balance support: Bilateral upper extremity supported;During functional activity Standing balance-Leahy Scale: Poor Standing balance comment: reliant on UE support                            Cognition Arousal/Alertness: Awake/alert Behavior During Therapy: WFL for tasks assessed/performed Overall Cognitive Status: Within Functional Limits for tasks assessed                                 General Comments: Very motivated      Exercises      General Comments        Pertinent Vitals/Pain Pain Assessment: Faces Faces Pain Scale: Hurts little more Pain Location: RLE  Pain Descriptors / Indicators: Constant;Grimacing;Discomfort;Guarding Pain Intervention(s): Monitored during session;Patient requesting pain meds-RN notified    Home Living                      Prior Function             PT Goals (current goals can now be found in the care plan section) Acute Rehab PT Goals Patient Stated Goal: "Return to home and continue OT for my vision." Potential to Achieve Goals: Good Progress towards PT goals: Progressing toward goals    Frequency    Min 4X/week      PT Plan Current plan remains appropriate    Co-evaluation              AM-PAC PT "6 Clicks" Daily Activity  Outcome Measure  Difficulty turning over in bed (including adjusting bedclothes, sheets and blankets)?: Unable Difficulty moving from lying on back to sitting on the side of the bed? : Unable Difficulty sitting down on and standing up from a chair with arms (e.g., wheelchair, bedside commode, etc,.)?: Unable Help needed moving to and from a bed to chair (including a wheelchair)?: A Little Help needed walking in hospital room?: A Little Help needed climbing 3-5 steps with a railing? : A Lot 6 Click Score: 11    End of Session Equipment Utilized During Treatment: Gait belt Activity Tolerance: Patient tolerated treatment well Patient left: in bed;with call bell/phone within reach;with family/visitor present Nurse Communication: Mobility status PT Visit Diagnosis: History of falling (Z91.81);Muscle weakness (generalized) (M62.81);Difficulty in walking, not elsewhere classified (R26.2)     Time: 7939-0300 PT Time Calculation (min) (ACUTE ONLY): 30 min  Charges:  $Gait Training: 23-37 mins                     Ellamae Sia, [place], DPT Acute Rehabilitation Services Pager [phone] Office [phone]    Willy Eddy 03/24/2018, 3:01 PM

## 2018-03-24 NOTE — Clinical Social Work Note (Signed)
Clinical Social Work Assessment  Patient Details  Name: Crystal [name] MRN: [id number] Date of Birth: [birth year]  Date of referral:  03/24/18               Reason for consult:  Facility Placement, Discharge Planning                Permission sought to share information with:  Family Supports Permission granted to share information::  Yes, Verbal Permission Granted  Name::     Jelani Vreeland  Agency::  snf  Relationship::  husband  Contact Information:  [phone]  Housing/Transportation Living arrangements for the past 2 months:  [place] of Information:  Patient Patient Interpreter Needed:  None Criminal Activity/Legal Involvement Pertinent to Current Situation/Hospitalization:  No - Comment as needed Significant Relationships:  Spouse Lives with:  Spouse Do you feel safe going back to the place where you live?  Yes Need for family participation in patient care:  Yes (Comment)  Care giving concerns:  Patients husband at bedside during social work assessment. Patient and husband live together and per patient husband is supportive of her needs   Facilities manager / plan:  Patient and spouse stated they would prefer  to go to CIR but understands that SNF will be a back up if patient does not get approved by insurance. CSW will follow up with patient once SNF bed offers are available. CSW following for support and discharge needs.  Employment status:  Retired Nurse, adult PT Recommendations:  Inpatient [place] / Referral to community resources:  [place]  Patient/Family's Response to care:  Family wanting to go to [facility] and is waiting for insurance authorization  Patient/Family's Understanding of and Emotional Response to Diagnosis, Current Treatment, and Prognosis:  Family agreeable with discharge plan   Emotional Assessment Appearance:  Appears stated age Attitude/Demeanor/Rapport:   Engaged Affect (typically observed):  Accepting, Pleasant Orientation:  Oriented to Self, Oriented to Situation, Oriented to Place, Oriented to  Time Alcohol / Substance use:  Not Applicable Psych involvement (Current and /or in the community):  No (Comment)  Discharge Needs  Concerns to be addressed:  Care Coordination Readmission within the last 30 days:  No Current discharge risk:  Dependent with Mobility Barriers to Discharge:  Continued Medical Work up, [place], LCSW 03/24/2018, 4:26 PM

## 2018-03-24 NOTE — Progress Notes (Signed)
PMR Admission Coordinator Pre-Admission Assessment   Patient: Crystal [name] is an 72 y.o., female MRN: [id number] DOB: 1945-07-27 Height: _0  (160 cm) Weight: 50 kg                                                                                                                                                  Insurance Information HMO: Yes    PPO:      PCP:      IPA:      80/20:      OTHER:  PRIMARY: BCBS Medicare      Policy#: [id number]      Subscriber: Patient CM Name: Limmie Patricia     Phone#: [phone]      Fax#: [phone] Pre-Cert#: TBD once pt arrives to unit (will update in [place] note)      Employer:  Auth provided by Santiago Glad at [facility] on 03/24/18 for admit date of 03/24/18.  Benefits:  Phone #: NA     Name: [email or web address] Eff. Date: 06/14/17     Deduct: $0      Out of Pocket Max: $5,500 (met [id number])      Life Max: NA CIR: $310/day for days 1-6, $0/day for days 7+      SNF: $0/day for days 1-20, $172/day for days 21-60, $0/day for days 61-100 (100 day limit) Outpatient: per necessity      Co-Pay: $40/visit  Home Health: 100%, per necessity      Co-Pay:  DME: 80%     Co-Pay: 20% Providers:  SECONDARY:       Policy#:       Subscriber:  CM Name:       Phone#:      Fax#:  Pre-Cert#:       Employer:  Benefits:  Phone #:      Name:  Eff. Date:      Deduct:       Out of Pocket Max:       Life Max:  CIR:       SNF:  Outpatient:      Co-Pay:  Home Health:       Co-Pay:  DME:      Co-Pay:    Medicaid Application Date:       Case Manager:  Disability Application Date:       Case Worker:    Emergency Contact Information         Contact Information     Name Relation Home Work Mobile    Warwick,[name] Spouse [phone]   [phone]       Current Medical History  Patient Admitting Diagnosis: Right femoral neck fracture after fall s/p percutaneous screw. 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  Per chart review patient lives with spouse.  Multilevel home with bedroom on main level.  2 steps to entry.  Reported to be using a cane prior to admission and needing assistance due to blindness.  They have a son in the area that works.  Husband can assist but also uses a cane.  Presented 03/18/2018 after mechanical fall.  No loss of consciousness.  Cranial CT scan as well as CT cervical spine negative.  X-rays imaging revealed right femoral neck fracture.  Preoperative clearance by cardiology services initially transitioned to IV heparin with Eliquis held with plan for surgical intervention and underwent percutaneous screw fixation 03/21/2018 per Dr. Lyla Glassing.  Hospital course pain management.  Weightbearing as tolerated right lower extremity.  Eliquis has been resumed for atrial fibrillation as well as maintained on amiodarone.  Therapy evaluations completed with recommendations of physical medicine rehab consult.  Patient is to be admitted for a comprehensive rehab program on 03/23/18.      Past Medical History      Past Medical History:  Diagnosis Date  . Adenomatous colon polyp 2007 & 2010     Dr [name]  . Allergy      seasonal  . Arthritis    . Cataract    . Diverticulosis    . 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 Hyperlipidemia    . Hypertension    . IBS (irritable bowel syndrome)    . Legally blind    . MVP (mitral valve prolapse)    . Paroxysmal atrial fibrillation with rapid ventricular response ([place]) 08/29/2017  . Peripheral vascular disease ([place])    . RAD (reactive airway disease)        Family History  family history includes COPD in her sister; Diabetes in her sister; Heart attack in her father; Heart attack (age of onset: 39) in her brother; Other in her son; Parkinsonism in her mother; Stroke in her mother.   Prior Rehab/Hospitalizations:   Has the patient had major surgery during 100 days prior to admission? No   Current Medications    Current Facility-Administered Medications:  .  acetaminophen (TYLENOL) tablet 650 mg, 650 mg, Oral, Q4H PRN, Wendee Beavers T, MD, 650 mg at 03/23/18 0938 .  albuterol (PROVENTIL) (2.5 MG/3ML) 0.083% nebulizer solution 2.5 mg, 2.5 mg, Inhalation, Q6H PRN, Cyndia Skeeters, Taye T, MD .  amiodarone (PACERONE) tablet 200 mg, 200 mg, Oral, BID, [place], Peter M, MD, 200 mg at 03/24/18 0829 .  [START ON 04/05/2018] amiodarone (PACERONE) tablet 200 mg, 200 mg, Oral, Daily, [place], Peter M, MD .  apixaban [facility]) tablet 5 mg, 5 mg, Oral, BID, Wendee Beavers T, MD, 5 mg at 03/24/18 0829 .  calcium-vitamin D (OSCAL WITH D) 500-200 MG-UNIT per tablet 1 tablet, 1 tablet, Oral, Q breakfast, Cyndia Skeeters, Taye T, MD, 1 tablet at 03/24/18 0830 .  cholecalciferol (VITAMIN D) tablet 3,000 Units, 3,000 Units, Oral, Q lunch, Wendee Beavers T, MD, 3,000 Units at 03/24/18 1153 .  docusate sodium (COLACE) capsule 100 mg, 100 mg, Oral, BID, Cyndia Skeeters, Taye T, MD, 100 mg at 03/23/18 0940 .  fentaNYL (SUBLIMAZE) injection 25 mcg, 25 mcg, Intravenous, Q30 min PRN, Wendee Beavers T, MD, 25 mcg at 03/22/18 6440 .  gabapentin (NEURONTIN) capsule 500 mg, 500 mg, Oral, QHS, Gonfa, Taye T, MD, 500 mg at 03/23/18 2156 .  lactated ringers infusion, , Intravenous, Continuous, Wendee Beavers T, MD .  Derrill Memo ON 03/25/2018] levothyroxine (SYNTHROID,  LEVOTHROID) tablet 25 mcg, 25 mcg, Oral, QAC breakfast, Danford, Christopher P, MD .  LORazepam (ATIVAN) tablet 1 mg, 1 mg, Oral, QHS, Gonfa, Taye T, MD, 1 mg at 03/23/18 2156 .  menthol-cetylpyridinium (CEPACOL) lozenge 3 mg, 1 lozenge, Oral, PRN **OR** phenol (CHLORASEPTIC) mouth spray 1 spray, 1 spray, Mouth/Throat, PRN, Gonfa, Taye T, MD .  metoCLOPramide (REGLAN) tablet 5-10 mg, 5-10 mg, Oral, Q8H PRN **OR** metoCLOPramide (REGLAN) injection 5-10 mg, 5-10 mg, Intravenous, Q8H PRN, Gonfa, Taye T, MD .  metoprolol  tartrate (LOPRESSOR) tablet 50 mg, 50 mg, Oral, BID, Danford, Suann Larry, MD, 50 mg at 03/24/18 0828 .  mometasone-formoterol (DULERA) 200-5 MCG/ACT inhaler 2 puff, 2 puff, Inhalation, BID, Wendee Beavers T, MD, 2 puff at 03/24/18 0717 .  montelukast (SINGULAIR) tablet 10 mg, 10 mg, Oral, QHS, Gonfa, Taye T, MD, 10 mg at 03/23/18 2200 .  ondansetron (ZOFRAN) tablet 4 mg, 4 mg, Oral, Q6H PRN **OR** ondansetron (ZOFRAN) injection 4 mg, 4 mg, Intravenous, Q6H PRN, Gonfa, Taye T, MD .  oxyCODONE (Oxy IR/ROXICODONE) immediate release tablet 5-10 mg, 5-10 mg, Oral, Q4H PRN, Cyndia Skeeters, Taye T, MD, 10 mg at 03/24/18 1644 .  predniSONE (DELTASONE) tablet 15 mg, 15 mg, Oral, Q breakfast, Danford, Suann Larry, MD, 15 mg at 03/24/18 0828 .  saccharomyces boulardii (FLORASTOR) capsule 250 mg, 250 mg, Oral, Daily, Gonfa, Taye T, MD, 250 mg at 03/24/18 0830 .  senna (SENOKOT) tablet 8.6 mg, 1 tablet, Oral, BID, Cyndia Skeeters, Taye T, MD, 8.6 mg at 03/22/18 2217 .  traMADol (ULTRAM) tablet 50 mg, 50 mg, Oral, Q6H PRN, Cyndia Skeeters, Taye T, MD, 50 mg at 03/24/18 1456   Patients Current Diet:     Diet Order                      Diet - low sodium heart healthy           Diet Heart Room service appropriate? Yes; Fluid consistency: Thin  Diet effective now                   Precautions / Restrictions Precautions Precautions: Fall Precaution Comments: Legally blind Restrictions Weight Bearing Restrictions: Yes RLE Weight Bearing: Weight bearing as tolerated    Has the patient had 2 or more falls or a fall with injury in the past year?Yes   Prior Activity Level Community (5-7x/wk): acitve, socializing with friends, went to gym    [place] / [place] Devices/Equipment: Cane (specify quad or straight) Home Equipment: Other (comment)(white cane)   Prior Device Use: Indicate devices/aids used by the patient prior to current illness, exacerbation or injury? weighted cane for low  vision/complete vision loss   Prior Functional Level Prior Function Level of Independence: Independent Comments: Pt was performing ADLs independently. Husband assisting with IADLs. Recently started OT to adapt and modify home environment for vision loss.   Self Care: Did the patient need help bathing, dressing, using the toilet or eating?  Independent   Indoor Mobility: Did the patient need assistance with walking from room to room (with or without device)? Independent   Stairs: Did the patient need assistance with internal or external stairs (with or without device)? Independent   Functional Cognition: Did the patient need help planning regular tasks such as shopping or remembering to take medications? Independent   Current Functional Level Cognition   Overall Cognitive Status: Within Functional Limits for tasks assessed Orientation Level: Oriented X4 General Comments: Very  motivated    Extremity Assessment (includes Sensation/Coordination)   Upper Extremity Assessment: Defer to OT evaluation  Lower Extremity Assessment: RLE deficits/detail RLE Deficits / Details: AAROM limited by pain for hip and knee flexion with edema noted, able to flex ankle WFL and knee extension strength 3-/5 RLE Coordination: decreased gross motor     ADLs   Overall ADL's : Needs assistance/impaired Eating/Feeding: With caregiver independent assisting, Sitting, Set up(Cues for placement) Eating/Feeding Details (indicate cue type and reason): Husband assisting to prep food due to vision deficits Grooming: Set up, Supervision/safety, Standing, Cueing for compensatory techniques, Cueing for sequencing, Wash/dry hands Upper Body Bathing: Set up, Supervision/ safety, Sitting Lower Body Bathing: Minimal assistance, Sit to/from stand Upper Body Dressing : Set up, Supervision/safety Lower Body Dressing: Moderate assistance, Sit to/from stand Lower Body Dressing Details (indicate cue type and reason): Mod A for  donning clothing over RLE and manage over hips. Min A for sit<>stand Toilet Transfer: 42, RW, Cueing for sequencing, Min Psychiatric nurse Details (indicate cue type and reason): Min A for power up into standing and then maintain balance during pivot. Cues for sequencign and RW management Toileting- Clothing Manipulation and Hygiene: Set up, Sitting/lateral lean Functional mobility during ADLs: Minimal assistance, Rolling walker General ADL Comments: pt. c/o B wrist pain due to "squeezing" the RW too hard.  wants to know if anything can be put on rw handles     Mobility   Overal bed mobility: Needs Assistance Bed Mobility: Supine to Sit Supine to sit: Min guard Sit to supine: Min assist General bed mobility comments: Patient able to elevate legs up ~75% and PT assisted with elevation the additional 25%. Use of bed rail. Tactile/auditory cueing provided for direction      Transfers   Overall transfer level: Needs assistance Equipment used: Rolling walker (2 wheeled) Transfers: Sit to/from Stand Sit to Stand: Min guard Stand pivot transfers: Min assist General transfer comment: Mod verbal cueing for safe hand placement. Decreased RLE weightbearing     Ambulation / Gait / Stairs / Wheelchair Mobility   Ambulation/Gait Ambulation/Gait assistance: Herbalist (Feet): 30 Feet Assistive device: Rolling walker (2 wheeled) Gait Pattern/deviations: Step-to pattern, Decreased stride length, Antalgic, Step-through pattern, Decreased step length - left General Gait Details: Verbal cueing for increased RLE weightbearing, progression to step through pattern, and decreased right step length. Patient able to progress to step through pattern for ~25% of gait. Frequent manual assistance and cueing provided for steering and negotiating walker.  Gait velocity: decr Gait velocity interpretation: <1.31 ft/sec, indicative of household ambulator     Posture / Balance Balance Overall  balance assessment: Needs assistance Sitting-balance support: No upper extremity supported, Feet supported Sitting balance-Leahy Scale: Good Standing balance support: Bilateral upper extremity supported, During functional activity Standing balance-Leahy Scale: Poor Standing balance comment: reliant on UE support     Special needs/care consideration BiPAP/CPAP: no CPM: no Continuous Drip IV: no Dialysis: no        Days: NA Life Vest: no Oxygen: no Special Bed:no Trach Size: no Wound Vac (area): no      Location: no Skin: ecchymosis on Bilateral arms and legs, moisture associated skin damage to labia, buttocks; incision to right hip.  Bowel mgmt:continent, last BM: 10/101/19 Bladder mgmt: continent Diabetic mgmt: no        Previous Home Environment Living Arrangements: Spouse/significant other Available Help at Discharge: Family, Available 24 hours/day Type of Home: House Home Layout: Able to live on main  level with bedroom/bathroom, Multi-level Alternate Level Stairs-Number of Steps: can stay on main Home Access: Stairs to enter Entrance Stairs-Rails: Left Entrance Stairs-Number of Steps: 2 steeper steps in garage with rail, 2-3 smaller steps no rail in front Bathroom Shower/Tub: Multimedia programmer: [place]: Yes Type of Home Care Services: [place] (if known): [employer]    Discharge Living Setting Plans for Discharge Living Setting: Patient's home, Lives with (comment)(husband) Type of Home at Discharge: House Discharge Home Layout: Multi-level, Able to live on main level with bedroom/bathroom Alternate Level Stairs-Rails: None(can live on first floor) Alternate Level Stairs-Number of Steps: NA Discharge Home Access: Stairs to enter Entrance Stairs-Rails: Left Entrance Stairs-Number of Steps: 3 with ledge Discharge Bathroom Shower/Tub: Walk-in shower Discharge Bathroom Toilet: Standard Discharge Bathroom Accessibility:  Yes How Accessible: Accessible via walker Does the patient have any problems obtaining your medications?: No   Social/Family/Support Systems Patient Roles: Spouse Contact Information: husband is emergency contact Anticipated Caregiver: husband  Anticipated Caregiver's Contact Information: husband cell: [phone]/ home:[phone] Ability/Limitations of Caregiver: husband can provide supervision due to his phsyical limitations/possible some CGA; son can assist as needed but is not close by Caregiver Availability: 24/7 Discharge Plan Discussed with Primary Caregiver: Yes(husband and pt's son) Is Caregiver In Agreement with Plan?: Yes Does Caregiver/Family have Issues with Lodging/Transportation while Pt is in Rehab?: No     Goals/Additional Needs Patient/Family Goal for Rehab: PT/OT: Mod I; SLP: NA Expected length of stay: 10-15 days Cultural Considerations: NA Dietary Needs: heart healthy, thin liquids Equipment Needs: TBD Special Service Needs: low vision strategies as pt is visually impaired and now using walker instead of normal weighted cane Pt/Family Agrees to Admission and willing to participate: Yes Program Orientation Provided & Reviewed with Pt/Caregiver Including Roles  & Responsibilities: Yes(with son, daughter in law, husband, and pt)  Barriers to Discharge: Home environment access/layout  Barriers to Discharge Comments: steps to enter     Decrease burden of Care through IP rehab admission: NA     Possible need for SNF placement upon discharge: Not anticipated; pt has good social support and has good prognosis for further progress.      Patient Condition: This patient's condition remains as documented in the consult dated 03/24/18, in which the Rehabilitation Physician determined and documented that the patient's condition is appropriate for intensive rehabilitative care in an inpatient rehabilitation facility. Will admit to inpatient rehab today.   Preadmission  Screen Completed By:  Jhonnie Garner, 03/24/2018 4:46 PM ______________________________________________________________________   Discussed status with Dr. Naaman Plummer on 03/24/18 at 4:46PM and received telephone approval for admission today.   Admission Coordinator:  Jhonnie Garner, time 4:46PM/Date 03/24/18             Cosigned by: Meredith Staggers, MD at 03/24/2018  5:26 PM  Revision History

## 2018-03-24 NOTE — Progress Notes (Signed)
Occupational Therapy Treatment Patient Details Name: Crystal [name] MRN: [id number] DOB: 1945-08-30 Today's Date: 03/24/2018    History of present illness 72 yo female presenting after mechanical fall at home and sustained right femoral neck fx. S/p percutaneous screw fixation and WBAT. 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    OT comments  Pt. Remains eager and motivated with skilled therapies.  Improvement with bed mobility but still requires cues secondary to visual impairments.  Excellent candidate for CIR level therapies.   Follow Up Recommendations  CIR;Supervision/Assistance - 24 hour    Equipment Recommendations  3 in 1 bedside commode    Recommendations for Other Services      Precautions / Restrictions Precautions Precautions: Fall Precaution Comments: Legally blind Restrictions RLE Weight Bearing: Weight bearing as tolerated       Mobility Bed Mobility Overal bed mobility: Needs Assistance Bed Mobility: Supine to Sit     Supine to sit: Min guard     General bed mobility comments: able to manage BLES out of bed without physical assistance but heavy reliance on bed rail  Transfers Overall transfer level: Needs assistance Equipment used: Rolling walker (2 wheeled) Transfers: Sit to/from [facility] Sit to Stand: Min guard;Min assist Stand pivot transfers: Min assist       General transfer comment: Cues for sequencing and guidance for hand placement    Balance                                           ADL either performed or assessed with clinical judgement   ADL Overall ADL's : Needs assistance/impaired     Grooming: Set up;Supervision/safety;Standing;Cueing for compensatory techniques;Cueing for sequencing;Wash/dry Teacher, music: Stand-pivot;RW;Cueing for sequencing;Min guard   Writer and  Hygiene: Set up;Sitting/lateral lean       Functional mobility during ADLs: Minimal assistance;Rolling walker General ADL Comments: pt. c/o B wrist pain due to "squeezing" the RW too hard.  wants to know if anything can be put on rw handles     Vision       Perception     Praxis      Cognition Arousal/Alertness: Awake/alert Behavior During Therapy: WFL for tasks assessed/performed Overall Cognitive Status: Within Functional Limits for tasks assessed                                          Exercises     Shoulder Instructions       General Comments      Pertinent Vitals/ Pain       Pain Assessment: No/denies pain  Home Living                                          Prior Functioning/Environment              Frequency  Min 3X/week        Progress Toward Goals  OT Goals(current goals can now be found in the care plan section)  Progress towards OT goals: Progressing  toward goals     Plan      Co-evaluation                 AM-PAC PT "6 Clicks" Daily Activity     Outcome Measure   Help from another person eating meals?: A Little Help from another person taking care of personal grooming?: A Little Help from another person toileting, which includes using toliet, bedpan, or urinal?: A Little Help from another person bathing (including washing, rinsing, drying)?: A Lot Help from another person to put on and taking off regular upper body clothing?: A Little Help from another person to put on and taking off regular lower body clothing?: A Lot 6 Click Score: 16    End of Session Equipment Utilized During Treatment: Gait belt;Rolling walker  OT Visit Diagnosis: Unsteadiness on feet (R26.81);Other abnormalities of gait and mobility (R26.89);Muscle weakness (generalized) (M62.81);Pain Pain - Right/Left: Right Pain - part of body: Leg   Activity Tolerance Patient tolerated treatment well   Patient Left in  chair;with call bell/phone within reach   Nurse Communication          Time: 5638-9373 OT Time Calculation (min): 20 min  Charges: OT General Charges $OT Visit: 1 Visit OT Treatments $Self Care/Home Management : 8-22 mins   Janice Coffin, COTA/L 03/24/2018, 1:16 PM

## 2018-03-24 NOTE — Care Management Note (Signed)
Case Management Note Marvetta Gibbons RN, BSN Transitions of Care Unit 4E- RN Case Manager [phone]  Patient Details  Name: Crystal [name] MRN: [id number] Date of Birth: 1946/02/22  Subjective/Objective:   Pt admitted s/p fall at home with afib RVR and hip fx- s/p OR nailing repair of hip                 Action/Plan: PTA pt lived at home with spouse, PT/OT evals ordered- CM to follow for recommendations and transition of care needs Update: CIR consulted for possible admission pending auth by insurance  Expected Discharge Date:  03/24/18               Expected Discharge Plan:  [place]  In-House Referral:  Clinical Social Work  Discharge planning Services  CM Consult  Post Acute Care Choice:  Durable Medical Equipment, Home Health, IP Rehab Choice offered to:  Patient  DME Arranged:    DME Agency:     HH Arranged:    [place] Agency:     Status of Service:  Completed, signed off  If discussed at [employer] of Stay Meetings, dates discussed:    Discharge Disposition: IP rehab  Additional Comments:  03/24/18- 1640- Marvetta Gibbons RN, CM- received notice from [place] with [facility]- that pt has received insurance auth for IP rehab and has bed available to day for admission to CIR. Per MD pt medically stable and will transition to CIR.   Dawayne Patricia, RN 03/24/2018, 4:34 PM

## 2018-03-24 NOTE — Discharge Instructions (Signed)
°Dr. Brian Swinteck °Adult Hip & Knee Specialist °[facility] °[address] °[place], [place] [address] °[phone] ° ° °POSTOPERATIVE DIRECTIONS ° ° ° °Hip Rehabilitation, Guidelines Following Surgery  ° °WEIGHT BEARING °Weight bearing as tolerated with assist device (walker, cane, etc) as directed, use it as long as suggested by your surgeon or therapist, typically at least 4-6 weeks. ° ° °HOME CARE INSTRUCTIONS  °Remove items at home which could result in a fall. This includes throw rugs or furniture in walking pathways.  °Continue medications as instructed at time of discharge. °· You may have some home medications which will be placed on hold until you complete the course of blood thinner medication. °· 4 days after discharge, you may start showering. No tub baths or soaking your incisions. °Do not put on socks or shoes without following the instructions of your caregivers.   °Sit on chairs with arms. Use the chair arms to help push yourself up when arising.  °Arrange for the use of a toilet seat elevator so you are not sitting low.  °· Walk with walker as instructed.  °You may resume a sexual relationship in one month or when given the OK by your caregiver.  °Use walker as long as suggested by your caregivers.  °Avoid periods of inactivity such as sitting longer than an hour when not asleep. This helps prevent blood clots.  °You may return to work once you are cleared by your surgeon.  °Do not drive a car for 6 weeks or until released by your surgeon.  °Do not drive while taking narcotics.  °Wear elastic stockings for two weeks following surgery during the day but you may remove then at night.  °Make sure you keep all of your appointments after your operation with all of your doctors and caregivers. You should call the office at the above phone number and make an appointment for approximately two weeks after the date of your surgery. °Please pick up a stool softener and laxative for  home use as long as you are requiring pain medications. °· ICE to the affected hip every three hours for 30 minutes at a time and then as needed for pain and swelling. Continue to use ice on the hip for pain and swelling from surgery. You may notice swelling that will progress down to the foot and ankle.  This is normal after surgery.  Elevate the leg when you are not up walking on it.   °It is important for you to complete the blood thinner medication as prescribed by your doctor. °· Continue to use the breathing machine which will help keep your temperature down.  It is common for your temperature to cycle up and down following surgery, especially at night when you are not up moving around and exerting yourself.  The breathing machine keeps your lungs expanded and your temperature down. ° °RANGE OF MOTION AND STRENGTHENING EXERCISES  °These exercises are designed to help you keep full movement of your hip joint. Follow your caregiver's or physical therapist's instructions. Perform all exercises about fifteen times, three times per day or as directed. Exercise both hips, even if you have had only one joint replacement. These exercises can be done on a training (exercise) mat, on the floor, on a table or on a bed. Use whatever works the best and is most comfortable for you. Use music or television while you are exercising so that the exercises are a pleasant break in your day. This will   make your life better with the exercises acting as a break in routine you can look forward to.  °Lying on your back, slowly slide your foot toward your buttocks, raising your knee up off the floor. Then slowly slide your foot back down until your leg is straight again.  °Lying on your back spread your legs as far apart as you can without causing discomfort.  °Lying on your side, raise your upper leg and foot straight up from the floor as far as is comfortable. Slowly lower the leg and repeat.  °Lying on your back, tighten up the  muscle in the front of your thigh (quadriceps muscles). You can do this by keeping your leg straight and trying to raise your heel off the floor. This helps strengthen the largest muscle supporting your knee.  °Lying on your back, tighten up the muscles of your buttocks both with the legs straight and with the knee bent at a comfortable angle while keeping your heel on the floor.  ° °SKILLED REHAB INSTRUCTIONS: °If the patient is transferred to a skilled rehab facility following release from the hospital, a list of the current medications will be sent to the facility for the patient to continue.  When discharged from the skilled rehab facility, please have the facility set up the patient's Home Health Physical Therapy prior to being released. Also, the skilled facility will be responsible for providing the patient with their medications at time of release from the facility to include their pain medication and their blood thinner medication. If the patient is still at the rehab facility at time of the two week follow up appointment, the skilled rehab facility will also need to assist the patient in arranging follow up appointment in our office and any transportation needs. ° °MAKE SURE YOU:  °Understand these instructions.  °Will watch your condition.  °Will get help right away if you are not doing well or get worse. ° °Pick up stool softner and laxative for home use following surgery while on pain medications. °Daily dry dressing changes as needed. °In 4 days, you may remove your dressings and begin taking showers - no tub baths or soaking the incisions. °Continue to use ice for pain and swelling after surgery. °Do not use any lotions or creams on the incision until instructed by your surgeon. ° ° °Information on my medicine - ELIQUIS® (apixaban) ° °Why was Eliquis® prescribed for you? °Eliquis® was prescribed for you to reduce the risk of a blood clot forming that can cause a stroke if you have a medical condition  called atrial fibrillation (a type of irregular heartbeat). ° °What do You need to know about Eliquis® ? °Take your Eliquis® TWICE DAILY - one tablet in the morning and one tablet in the evening with or without food. If you have difficulty swallowing the tablet whole please discuss with your pharmacist how to take the medication safely. ° °Take Eliquis® exactly as prescribed by your doctor and DO NOT stop taking Eliquis® without talking to the doctor who prescribed the medication.  Stopping may increase your risk of developing a stroke.  Refill your prescription before you run out. ° °After discharge, you should have regular check-up appointments with your healthcare provider that is prescribing your Eliquis®.  In the future your dose may need to be changed if your kidney function or weight changes by a significant amount or as you get older. ° °What do you do if you miss a dose? °If you miss   a dose, take it as soon as you remember on the same day and resume taking twice daily.  Do not take more than one dose of ELIQUIS at the same time to make up a missed dose. ° °Important Safety Information °A possible side effect of Eliquis® is bleeding. You should call your healthcare provider right away if you experience any of the following: °? Bleeding from an injury or your nose that does not stop. °? Unusual colored urine (red or dark brown) or unusual colored stools (red or black). °? Unusual bruising for unknown reasons. °? A serious fall or if you hit your head (even if there is no bleeding). ° °Some medicines may interact with Eliquis® and might increase your risk of bleeding or clotting while on Eliquis®. To help avoid this, consult your healthcare provider or pharmacist prior to using any new prescription or non-prescription medications, including herbals, vitamins, non-steroidal anti-inflammatory drugs (NSAIDs) and supplements. ° °This website has more information on Eliquis® (apixaban):  http://www.eliquis.com/eliquis/home ° °

## 2018-03-24 NOTE — NC FL2 (Signed)
[place] LEVEL OF CARE SCREENING TOOL     IDENTIFICATION  Patient Name: Crystal [name] Birthdate: Jul 01, 1945 Sex: female Admission Date (Current Location): 03/18/2018  [facility] and [place] Number:  Herbalist and Address:  [facility], [place] [address] [place], [place] [address]      Provider Number: [id number]  Attending Physician Name and Address:  Edwin Dada, *  Relative Name and Phone Number:       Current Level of Care: Hospital Recommended Level of Care: [place] Prior Approval Number:    Date Approved/Denied:   PASRR Number: Manual review  Discharge Plan: SNF    Current Diagnoses: Patient Active Problem List   Diagnosis Date Noted  . Fracture of femoral neck, right ([place]) 03/22/2018  . Atrial fibrillation with RVR ([place]) 03/21/2018  . Atrial fibrillation ([place]) 03/18/2018  . 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 Paroxysmal atrial fibrillation ([facility])   . Herpes zoster without complication 11/94/1740  . Jaw claudication 07/27/2017  . Temporal arteritis ([place]) 07/18/2017  . Vision, loss, sudden, bilateral 07/18/2017  . 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 Parotitis 10/06/2016  . Osteoarthritis 06/25/2016  . Anxiety 06/25/2016  . Back pain 07/18/2015  . 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 Osteopenia 06/03/2008  . History of colonic polyps 06/03/2008  . INTENTION TREMOR 12/06/2007  . IBS 10/16/2007  . Hyperlipidemia 06/06/2007  . Essential hypertension 06/06/2007  . Chronic obstructive airway disease with asthma ([place]) 02/23/2007    Orientation RESPIRATION BLADDER Height & Weight     Self, Time, Situation, Place  Normal Continent, External catheter Weight: 110 lb 3.7 oz (50 kg) Height:  5\' 3"  (160 cm)  BEHAVIORAL SYMPTOMS/MOOD NEUROLOGICAL BOWEL NUTRITION STATUS  (None) (None) Continent Diet(Heart healthy)  AMBULATORY STATUS COMMUNICATION OF NEEDS  Skin   Limited Assist Verbally Bruising, Other (Comment), Surgical wounds(MASD.)                       Personal Care Assistance Level of Assistance  Bathing, Feeding, Dressing Bathing Assistance: Limited assistance Feeding assistance: Limited assistance Dressing Assistance: Limited assistance     Functional Limitations Info  Sight, Hearing, Speech Sight Info: Adequate Hearing Info: Adequate Speech Info: Adequate    SPECIAL CARE FACTORS FREQUENCY  PT (By licensed PT), OT (By licensed OT)     PT Frequency: 5 x week OT Frequency: 5 x week            Contractures Contractures Info: Not present    Additional Factors Info  Code Status, Allergies, Psychotropic Code Status Info: Full code Allergies Info: Simvastatin. Psychotropic Info: Anxiety: Ativan 1 mg PO QHS.         Current Medications (03/24/2018):  This is the current hospital active medication list Current Facility-Administered Medications  Medication Dose Route Frequency Provider Last Rate Last Dose  . acetaminophen (TYLENOL) tablet 650 mg  650 mg Oral Q4H PRN Mercy Riding, MD   650 mg at 03/23/18 0938  . albuterol (PROVENTIL) (2.5 MG/3ML) 0.083% nebulizer solution 2.5 mg  2.5 mg Inhalation Q6H PRN Wendee Beavers T, MD      . amiodarone (PACERONE) tablet 200 mg  200 mg Oral BID [place], Peter M, MD   200 mg at 03/24/18 0829  . [START ON 04/05/2018] amiodarone (PACERONE) tablet 200 mg  200 mg Oral Daily [place], Peter M, MD      . apixaban Arne [place]) tablet  5 mg  5 mg Oral BID Wendee Beavers T, MD   5 mg at 03/24/18 0829  . calcium-vitamin D (OSCAL WITH D) 500-200 MG-UNIT per tablet 1 tablet  1 tablet Oral Q breakfast Mercy Riding, MD   1 tablet at 03/24/18 0830  . cholecalciferol (VITAMIN D) tablet 3,000 Units  3,000 Units Oral Q lunch Mercy Riding, MD   3,000 Units at 03/24/18 1153  . docusate sodium (COLACE) capsule 100 mg  100 mg Oral BID Wendee Beavers T, MD   100 mg at 03/23/18 0940  . fentaNYL (SUBLIMAZE)  injection 25 mcg  25 mcg Intravenous Q30 min PRN Mercy Riding, MD   25 mcg at 03/22/18 6962  . gabapentin (NEURONTIN) capsule 500 mg  500 mg Oral QHS Wendee Beavers T, MD   500 mg at 03/23/18 2156  . lactated ringers infusion   Intravenous Continuous Gonfa, Taye T, MD      . LORazepam (ATIVAN) tablet 1 mg  1 mg Oral QHS Wendee Beavers T, MD   1 mg at 03/23/18 2156  . menthol-cetylpyridinium (CEPACOL) lozenge 3 mg  1 lozenge Oral PRN Gonfa, Taye T, MD       Or  . phenol (CHLORASEPTIC) mouth spray 1 spray  1 spray Mouth/Throat PRN Gonfa, Taye T, MD      . metoCLOPramide (REGLAN) tablet 5-10 mg  5-10 mg Oral Q8H PRN Gonfa, Taye T, MD       Or  . metoCLOPramide (REGLAN) injection 5-10 mg  5-10 mg Intravenous Q8H PRN Gonfa, Taye T, MD      . metoprolol tartrate (LOPRESSOR) tablet 50 mg  50 mg Oral BID Edwin Dada, MD   50 mg at 03/24/18 0828  . mometasone-formoterol (DULERA) 200-5 MCG/ACT inhaler 2 puff  2 puff Inhalation BID Mercy Riding, MD   2 puff at 03/24/18 0717  . montelukast (SINGULAIR) tablet 10 mg  10 mg Oral QHS Wendee Beavers T, MD   10 mg at 03/23/18 2200  . ondansetron (ZOFRAN) tablet 4 mg  4 mg Oral Q6H PRN Gonfa, Taye T, MD       Or  . ondansetron (ZOFRAN) injection 4 mg  4 mg Intravenous Q6H PRN Gonfa, Taye T, MD      . oxyCODONE (Oxy IR/ROXICODONE) immediate release tablet 5-10 mg  5-10 mg Oral Q4H PRN Wendee Beavers T, MD   10 mg at 03/24/18 1153  . predniSONE (DELTASONE) tablet 15 mg  15 mg Oral Q breakfast Edwin Dada, MD   15 mg at 03/24/18 0828  . saccharomyces boulardii (FLORASTOR) capsule 250 mg  250 mg Oral Daily Wendee Beavers T, MD   250 mg at 03/24/18 0830  . senna (SENOKOT) tablet 8.6 mg  1 tablet Oral BID Wendee Beavers T, MD   8.6 mg at 03/22/18 2217  . traMADol (ULTRAM) tablet 50 mg  50 mg Oral Q6H PRN Mercy Riding, MD   50 mg at 03/23/18 1212     Discharge Medications: Please see discharge summary for a list of discharge medications.  Relevant Imaging  Results:  Relevant Lab Results:   Additional Information SS#: [id number]  Candie Chroman, LCSW

## 2018-03-24 NOTE — Progress Notes (Signed)
PROGRESS NOTE    Crystal [name]  IRJ:[id number] DOB: [birth year] DOA: 03/18/2018 PCP: [name], MD      Brief Narrative:  Crystal [name] is a 72 y.o. F with pAF on Eliquis, GCA now blind, HTN and PVD who presents with a chemical fall and right hip fracture as well as atrial fibrillation with RVR.   Assessment & Plan:  Atrial fibrillation with RVR Loaded with IV amiodarone, digoxin.  Still in A. fib, rates now normalized with amio and new metop.    -Continue amiod, metop -Hold theo -Continue Eliquis   Subclinical hypothyroidism Hard to interpret low free T3 in context of subclinical hypothyroidism, but defer levothyroxine to Cardiology.  Close follow up TSH T3, free T4 as outpatient.  Right hip fracture Status post ORIF of 10/8. -Consult orthopedics, appreciate cares  Hyperkalemia Mild, trending down.  Not on supplements, potass sparing diuretics or ACE/Arb.  Restart furosemide at discharge.  Hypertension BP normal  Giant cell arteritis -Continue prednisone  COPD No active disase -Continue home inhalers      DVT prophylaxis: N/A on Eliquis Code Status: FULL Family Communication: None present MDM and disposition Plan: Labs and imaging below were summarized above.  Meds mgmt as above.  The patient was admitted with right hip fracutre and Afib with RVR.  She is medically complex with atrial fibrillation requiring active rate control titration, GCA on chronic steroids, with stress dosing, and blindness. Given her baseline functional status being excellent/independent, and her baseline blindness complicating her rehab efforts, in the context of Afib with RVR during this hospitalization, new amiodarone, and chronic steroids, She is appropriate for inpatient rehab.   Consultants:   ORthopedics  Cardiology  Procedures:   ORIF 10/8  Antimicrobials:   None    Subjective: No chest pain, palpitations.  Hip pain is tolerable.  No dyspnea.  No new fever, cough,  sputum.       Objective: Vitals:   03/23/18 2030 03/24/18 0425 03/24/18 0717 03/24/18 0825  BP: 114/69 132/68  130/79  Pulse:    91  Resp: 20 19  17   Temp:  97.6 F (36.4 C)  98.2 F (36.8 C)  TempSrc:  Oral  Oral  SpO2: 96% 96% 97%   Weight:      Height:        Intake/Output Summary (Last 24 hours) at 03/24/2018 1352 Last data filed at 03/24/2018 1200 Gross per 24 hour  Intake 480 ml  Output 851 ml  Net -371 ml   Filed Weights   03/18/18 0627 03/21/18 1117  Weight: 50.8 kg 50 kg    Examination: General appearance: Thin elderly female, lying in bed, no acute distress HEENT: Anicteric, conjunctival pink, lids and lashes normal.  No nasal deformity, discharge, epistaxis.  Lips moist, dentition normal.  Oropharynx moist, no oral lesions.  Hearing normal. Skin: Warm and dry, no jaundice, no suspicious rashes or lesions. Cardiac: Rate regular, rhythm irregular, normal T0-Z6, soft systolic murmur, no lower extremity edema, JVP normal Respiratory: Normal respiratory effort, lungs clear without rales or wheezes. Abdomen: Abdomen soft without tenderness to palpation, ascites, distention, or hepatospleno megaly.   MSK: Bruising right hip, otherwise incision appears clean dry and intact.  No other deformities of large effusions of the joints of the upper or lower extremities bilaterally. Neuro: Awake alert, moves all extremities, blind. Psych: Sodium intact responding to questions, attention normal, affect normal, judgment and insight appear normal.    Data Reviewed: I have personally reviewed following  labs and imaging studies:  CBC: Recent Labs  Lab 03/18/18 0652 03/19/18 0505 03/20/18 0329 03/21/18 0227 03/23/18 0254 03/24/18 0353  WBC 12.3* 15.6* 11.8* 11.1* 8.4 7.7  NEUTROABS 8.8*  --   --   --  5.4  --   HGB 14.3 13.2 12.3 12.4 12.0 11.4*  HCT 43.1 40.4 37.8 38.9 38.6 35.7*  MCV 107.8* 110.7* 110.2* 111.8* 113.2* 110.9*  PLT 289 247 228 211 219 580   Basic  Metabolic Panel: Recent Labs  Lab 03/18/18 0652 03/18/18 1432  03/21/18 0227 03/22/18 0813 03/22/18 1102 03/23/18 0254 03/24/18 0353  NA 137  --    < > 135 134* 136 135 133*  K 3.8  --    < > 4.7 6.4* 4.4 5.4* 5.2*  CL 94*  --    < > 101 105 101 105 99  CO2 29  --    < > 26 17* 24 24 23   GLUCOSE 81  --    < > 75 81 98 93 79  BUN 14  --    < > 8 8 5* 14 24*  CREATININE 0.74  --    < > 0.62 0.64 0.70 0.95 0.99  CALCIUM 9.8  --    < > 8.6* 8.6* 8.7* 8.8* 8.8*  MG 1.9 1.7  --  2.2 2.2  --   --   --    < > = values in this interval not displayed.   GFR: Estimated Creatinine Clearance: 39.4 mL/min (by C-G formula based on SCr of 0.99 mg/dL). Liver Function Tests: Recent Labs  Lab 03/18/18 0652  AST 20  ALT 24  ALKPHOS 53  BILITOT 1.0  PROT 6.4*  ALBUMIN 4.0   No results for input(s): LIPASE, AMYLASE in the last 168 hours. No results for input(s): AMMONIA in the last 168 hours. Coagulation Profile: Recent Labs  Lab 03/18/18 0652  INR 0.91   Cardiac Enzymes: No results for input(s): CKTOTAL, CKMB, CKMBINDEX, TROPONINI in the last 168 hours. BNP (last 3 results) No results for input(s): PROBNP in the last 8760 hours. HbA1C: No results for input(s): HGBA1C in the last 72 hours. CBG: No results for input(s): GLUCAP in the last 168 hours. Lipid Profile: No results for input(s): CHOL, HDL, LDLCALC, TRIG, CHOLHDL, LDLDIRECT in the last 72 hours. Thyroid Function Tests: Recent Labs    03/21/18 1610 03/22/18 1102  TSH 5.142*  --   FREET4  --  1.04  T3FREE  --  0.9*   Anemia Panel: Recent Labs    03/22/18 0305  VITAMINB12 265  FOLATE 8.0   Urine analysis: No results found for: COLORURINE, APPEARANCEUR, LABSPEC, PHURINE, GLUCOSEU, HGBUR, BILIRUBINUR, KETONESUR, PROTEINUR, UROBILINOGEN, NITRITE, LEUKOCYTESUR Sepsis Labs: @LABRCNTIP (procalcitonin:4,lacticacidven:4)  ) Recent Results (from the past 240 hour(s))  MRSA PCR Screening     Status: None   Collection  Time: 03/19/18  4:35 AM  Result Value Ref Range Status   MRSA by PCR NEGATIVE NEGATIVE Final    Comment:        The GeneXpert MRSA Assay (FDA approved for NASAL specimens only), is one component of a comprehensive MRSA colonization surveillance program. It is not intended to diagnose MRSA infection nor to guide or monitor treatment for MRSA infections. Performed at [facility], [place] [address] [place], [place] [address]          Radiology Studies: No results found.      Scheduled Meds: . amiodarone  200 mg Oral  BID  . [START ON 04/05/2018] amiodarone  200 mg Oral Daily  . apixaban  5 mg Oral BID  . calcium-vitamin D  1 tablet Oral Q breakfast  . cholecalciferol  3,000 Units Oral Q lunch  . docusate sodium  100 mg Oral BID  . gabapentin  500 mg Oral QHS  . LORazepam  1 mg Oral QHS  . metoprolol tartrate  50 mg Oral BID  . mometasone-formoterol  2 puff Inhalation BID  . montelukast  10 mg Oral QHS  . predniSONE  15 mg Oral Q breakfast  . saccharomyces boulardii  250 mg Oral Daily  . senna  1 tablet Oral BID   Continuous Infusions: . lactated ringers       LOS: 6 days    Time spent: 25 minutes    Edwin Dada, MD [facility] 03/24/2018, 1:52 PM     Pager [phone] --- please page though AMION:  [email or web address] Password [email or web address] If 7PM-7AM, please contact night-coverage

## 2018-03-24 NOTE — Progress Notes (Signed)
Inpatient Rehabilitation-Admissions Coordinator   [facility] has received insurance authorization and medical approval for admit to CIR today. AC has updated pt, her family, CM and SW regarding plans.   Please call if questions.   Jhonnie Garner, OTR/L  Rehab Admissions Coordinator  [phone] 03/24/2018 5:00 PM

## 2018-03-24 NOTE — Progress Notes (Signed)
03/24/2018 1830 Report given to Loree Fee, RN. Pt transferred to [id number]-01 via bed with staff and family. Pt placed in room in no acute distress.  Amanda Cockayne, RN

## 2018-03-24 NOTE — Discharge Summary (Signed)
Physician Discharge Summary  Crystal [name] GNF:[id number] DOB: 02/12/1946 DOA: 03/18/2018  PCP: [name], MD  Admit date: 03/18/2018 Discharge date: 03/24/2018  Admitted From: Home  Disposition:  CIR  Recommendations for Outpatient Follow-up:  1. Follow up with PCP in 1-2 weeks after rehab discharge 2. Please follow up with Cardiology as directed 3. Please obtain BMP in 5 days to monitor furosemide restart and check K 4. Please obtain short term interval TSH and T3, free T4     Home Health: N/A  Equipment/Devices: TBD at CIR  Discharge Condition: Fair  CODE STATUS: FULL Diet recommendation: Cardiac  Brief/Interim Summary: Crystal [name] is a 72 y.o. F with pAF on Eliquis, GCA now blind, HTN and PVD who presents with a chemical fall and right hip fracture as well as atrial fibrillation with RVR.     Principal hospital diagnosis: Right hip fracture    Discharge Diagnoses:   Atrial fibrillation with RVR Admitted and loaded with IV amiodarone, digoxin.  HR improved.  Digoxin stopped.  Transitioned to oral amiodarone and metoprolol and HR normalized.  CHA2DS2-Vasc 3, Eliquis was continued.   -Discharged on 2 more weeks BID amiodarone, then once daily -Diltiazem and theophylline stopped -Metoprolol started    Subclinical hypothyroidism TSH low, free T4 normal, free T3 low.  Hard to interpret low free T3 (rather than total T3) but in light of new amiodarone, reasonable to start levothyroxine per Caridology recommendations. -Continue new levothyroxine low dose -Recommend to obtain close interval trendof TSH, T3, free T4  Right hip fracture Status post ORIF 03/21/2018.  Hyperkalemia Mild.  In setting of holding furosemide  Hypertension Restarted home furosemide on discharge.  Giant cell arteritis Given stress dose steroids, returned to 7.5 mg daily at discharge.  COPD No active disease        Discharge Instructions  Discharge Instructions    Diet -  low sodium heart healthy   Complete by:  As directed    Discharge instructions   Complete by:  As directed    From Dr. Loleta [name]: You were admitted with fall and hip fracture and also found to have out of control A fib.  For the hip fracture: These require surgical repair, which was completed. You may take hydrocodone for pain, but try to minimize this and transition to acetaminophen/Tylenol soon. While you are taking hydrocodone, make sure to take Senna and if needed MiraLax to have a bowel movement every day.  For the A fib: We have stopped your diltiazem and replaced it with metoprolol and amiodarone. Take amiodarone 200 mg twice daily for 2 weeks, then switch to 200 mg once daily after that. Have your doctor check your thyroid (TSH) in 1 month, and after that, periodically assess thyroid and liver functions while on amiodarone. Do not take theophylline anymore, because this provokes Afib. Stop diltiazem. Follow up with Cardiology as instructed.  With regard to your other medicines: Restart your furosemide  Have a repeat metabolic panel in 1 week.  Also: Your thyroid tests were slightly abnormal here.  For now, in the context of starting amiodarone (which can affect your thyroid), start the low dose of thyroid supplement levothyroxine 25 mcg daily Repeat TSH in 2-4 weeks   Increase activity slowly   Complete by:  As directed      Allergies as of 03/24/2018      Reactions   Simvastatin    myalgias      Medication List    STOP taking these  medications   CARTIA XT 120 MG 24 hr capsule Generic drug:  diltiazem   diltiazem 30 MG tablet Commonly known as:  CARDIZEM   theophylline 400 MG 24 hr tablet Commonly known as:  UNIPHYL   traMADol 50 MG tablet Commonly known as:  ULTRAM     TAKE these medications   acetaminophen 500 MG tablet Commonly known as:  TYLENOL Take 1,000 mg by mouth every 6 (six) hours as needed for headache.   albuterol 108 (90 Base) MCG/ACT  inhaler Commonly known as:  PROVENTIL HFA;VENTOLIN HFA INHALE 1 PUFF INTO THE LUNGS AS DIRECTED IF NEEDED What changed:  See the new instructions.   alendronate 70 MG tablet Commonly known as:  FOSAMAX Take 70 mg by mouth once a week. On Friday   amiodarone 200 MG tablet Commonly known as:  PACERONE Take 1 tablet (200 mg total) by mouth 2 (two) times daily.   amiodarone 200 MG tablet Commonly known as:  PACERONE Take 1 tablet (200 mg total) by mouth daily. Start taking on:  04/05/2018   apixaban 5 MG Tabs tablet Commonly known as:  ELIQUIS Take 1 tablet (5 mg total) by mouth 2 (two) times daily.   CALCIUM 600/VITAMIN D PO Take 1 tablet by mouth daily.   furosemide 40 MG tablet Commonly known as:  LASIX TAKE ONE TABLET BY MOUTH DAILY   gabapentin 100 MG capsule Commonly known as:  NEURONTIN Take 800-900 mg by mouth at bedtime.   levothyroxine 25 MCG tablet Commonly known as:  SYNTHROID, LEVOTHROID Take 1 tablet (25 mcg total) by mouth daily before breakfast.   levothyroxine 25 MCG tablet Commonly known as:  SYNTHROID, LEVOTHROID Take 1 tablet (25 mcg total) by mouth daily before breakfast. Start taking on:  03/25/2018   LORazepam 1 MG tablet Commonly known as:  ATIVAN Take 1 tablet (1 mg total) by mouth at bedtime.   metoprolol tartrate 50 MG tablet Commonly known as:  LOPRESSOR Take 1 tablet (50 mg total) by mouth 2 (two) times daily.   montelukast 10 MG tablet Commonly known as:  SINGULAIR Take 1 tablet (10 mg total) by mouth daily.   oxyCODONE 5 MG immediate release tablet Commonly known as:  Oxy IR/ROXICODONE Take 1 tablet (5 mg total) by mouth every 6 (six) hours as needed for severe pain.   potassium chloride SA 20 MEQ tablet Commonly known as:  K-DUR,KLOR-CON Take 2 tablets (40 mEq total) by mouth daily.   pravastatin 40 MG tablet Commonly known as:  PRAVACHOL TAKE 1 TABLET DAILY BY MOUTH AT BEDTIME What changed:  See the new instructions.    predniSONE 5 MG tablet Commonly known as:  DELTASONE Take 7.5 mg by mouth daily with breakfast.   saccharomyces boulardii 250 MG capsule Commonly known as:  FLORASTOR Take 250 mg by mouth daily.   senna 8.6 MG Tabs tablet Commonly known as:  SENOKOT Take 1 tablet (8.6 mg total) by mouth 2 (two) times daily.   SYMBICORT 160-4.5 MCG/ACT inhaler Generic drug:  budesonide-formoterol INHALE 1 OR 2 PUFFS INTO THE LUNGS EVERY 12 HOURS. THEN GARGLE AND SPIT AFTER USE What changed:  See the new instructions.   Vitamin D3 1000 units Caps Take 1,000 Units by mouth daily with lunch.      Follow-up Information    Swinteck, Aaron Edelman, MD. Schedule an appointment as soon as possible for a visit in 2 weeks.   Specialty:  Orthopedic Surgery Why:  For wound re-check Contact information: [address]  [address] [place] [address] [phone]          Allergies  Allergen Reactions  . Simvastatin     myalgias    Consultations:  Cardiology   Procedures/Studies: Ct Head Wo Contrast  Result Date: 03/18/2018 CLINICAL DATA:  Fall, headache EXAM: CT HEAD WITHOUT CONTRAST CT CERVICAL SPINE WITHOUT CONTRAST TECHNIQUE: Multidetector CT imaging of the head and cervical spine was performed following the standard protocol without intravenous contrast. Multiplanar CT image reconstructions of the cervical spine were also generated. COMPARISON:  11/27/2007 FINDINGS: CT HEAD FINDINGS Brain: Mild age related volume loss. No acute intracranial abnormality. Specifically, no hemorrhage, hydrocephalus, mass lesion, acute infarction, or significant intracranial injury. Vascular: No hyperdense vessel or unexpected calcification. Skull: No acute calvarial abnormality. Sinuses/Orbits: Visualized paranasal sinuses and mastoids clear. Orbital soft tissues unremarkable. Other: None CT CERVICAL SPINE FINDINGS Alignment: 3 mm of anterolisthesis of C4 on C5 related to facet disease. Skull base and vertebrae: No  acute fracture. No primary bone lesion or focal pathologic process. Soft tissues and spinal canal: No prevertebral fluid or swelling. No visible canal hematoma. Disc levels: Degenerative disc and facet disease diffusely throughout the cervical spine. Disc disease is most pronounced at C5-6 and C6-7. Facet disease is most pronounced at C3-4 and C4-5. Upper chest: No acute findings Other: No acute findings IMPRESSION: No acute intracranial abnormality. No acute bony abnormality in the cervical spine. Degenerative disc and facet disease. Electronically Signed   By: Rolm Baptise M.D.   On: 03/18/2018 08:53   Ct Cervical Spine Wo Contrast  Result Date: 03/18/2018 CLINICAL DATA:  Fall, headache EXAM: CT HEAD WITHOUT CONTRAST CT CERVICAL SPINE WITHOUT CONTRAST TECHNIQUE: Multidetector CT imaging of the head and cervical spine was performed following the standard protocol without intravenous contrast. Multiplanar CT image reconstructions of the cervical spine were also generated. COMPARISON:  11/27/2007 FINDINGS: CT HEAD FINDINGS Brain: Mild age related volume loss. No acute intracranial abnormality. Specifically, no hemorrhage, hydrocephalus, mass lesion, acute infarction, or significant intracranial injury. Vascular: No hyperdense vessel or unexpected calcification. Skull: No acute calvarial abnormality. Sinuses/Orbits: Visualized paranasal sinuses and mastoids clear. Orbital soft tissues unremarkable. Other: None CT CERVICAL SPINE FINDINGS Alignment: 3 mm of anterolisthesis of C4 on C5 related to facet disease. Skull base and vertebrae: No acute fracture. No primary bone lesion or focal pathologic process. Soft tissues and spinal canal: No prevertebral fluid or swelling. No visible canal hematoma. Disc levels: Degenerative disc and facet disease diffusely throughout the cervical spine. Disc disease is most pronounced at C5-6 and C6-7. Facet disease is most pronounced at C3-4 and C4-5. Upper chest: No acute findings  Other: No acute findings IMPRESSION: No acute intracranial abnormality. No acute bony abnormality in the cervical spine. Degenerative disc and facet disease. Electronically Signed   By: Rolm Baptise M.D.   On: 03/18/2018 08:53   Pelvis Portable  Result Date: 03/21/2018 CLINICAL DATA:  72 year old female with hip fracture. Subsequent encounter. EXAM: PORTABLE PELVIS 1-2 VIEWS COMPARISON:  03/21/2018 intraoperative films. FINDINGS: Three pins traverse right subcapital fracture. Pins appear in appropriate position on this frontal projection. IMPRESSION: Post pinning right femoral neck fracture. Electronically Signed   By: Genia Del M.D.   On: 03/21/2018 16:27   Ct Hip Right Wo Contrast  Result Date: 03/18/2018 CLINICAL DATA:  Right hip pain due to a fall on steps today. Initial encounter. EXAM: CT OF THE RIGHT HIP WITHOUT CONTRAST TECHNIQUE: Multidetector CT imaging of the right hip was performed according  to the standard protocol. Multiplanar CT image reconstructions were also generated. COMPARISON:  Plain films right hip this same day. FINDINGS: Bones/Joint/Cartilage Mildly impacted subcapital right hip fracture is identified as seen on the comparison plain films. No other fracture is seen. 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 Degenerative change about the symphysis pubis also noted. Ligaments Suboptimally assessed by CT. Muscles and Tendons Appear intact. Soft tissues Imaged intrapelvic contents demonstrate no acute abnormality. Atherosclerosis and diverticulosis noted. IMPRESSION: Mildly impacted subcapital fracture right hip. No other acute finding. Electronically Signed   By: Inge Rise M.D.   On: 03/18/2018 12:34   Dg Chest Port 1 View  Result Date: 03/18/2018 CLINICAL DATA:  Pt missed a step at home and fell onto her right side this AM; pt complains of lateral right hip pain that radiates down right leg; hx of HTN, MVP, and A-fib; former smoker EXAM: PORTABLE CHEST - 1 VIEW  COMPARISON:  09/07/2017 FINDINGS: Lungs are clear. Heart size and mediastinal contours are within normal limits. Aortic Atherosclerosis (ICD10-170.0). No effusion.  No pneumothorax. Visualized bones unremarkable. IMPRESSION: No acute cardiopulmonary disease. Electronically Signed   By: Lucrezia Europe M.D.   On: 03/18/2018 08:58   Dg Knee Right Port  Result Date: 03/18/2018 CLINICAL DATA:  Pt missed a step at home and fell onto her right side this AM; pt complains of lateral right hip pain that radiates down right leg EXAM: PORTABLE RIGHT KNEE - 1-2 VIEW COMPARISON:  None. FINDINGS: No evidence of fracture, dislocation, or joint effusion. Mild diffuse osteopenia. No evidence of arthropathy or other focal bone abnormality. Soft tissues are unremarkable. IMPRESSION: Negative. Electronically Signed   By: Lucrezia Europe M.D.   On: 03/18/2018 08:59   Dg C-arm 1-60 Min  Result Date: 03/21/2018 CLINICAL DATA:  Surgical internal fixation of right hip fracture. EXAM: OPERATIVE right HIP (WITH PELVIS IF PERFORMED) 3 VIEWS TECHNIQUE: Fluoroscopic spot image(s) were submitted for interpretation post-operatively. FLUOROSCOPY TIME:  1 minutes 34 seconds. COMPARISON:  Radiographs of March 18, 2018. FINDINGS: Three intraoperative fluoroscopic images of the right hip demonstrate the patient be status post surgical internal fixation of proximal right femoral neck fracture. Good alignment of fracture components is noted. IMPRESSION: Status post surgical internal fixation of proximal right femoral neck fracture. Electronically Signed   By: Marijo Conception, M.D.   On: 03/21/2018 14:46   Dg Hip Operative Unilat W Or W/o Pelvis Right  Result Date: 03/21/2018 CLINICAL DATA:  Surgical internal fixation of right hip fracture. EXAM: OPERATIVE right HIP (WITH PELVIS IF PERFORMED) 3 VIEWS TECHNIQUE: Fluoroscopic spot image(s) were submitted for interpretation post-operatively. FLUOROSCOPY TIME:  1 minutes 34 seconds. COMPARISON:   Radiographs of March 18, 2018. FINDINGS: Three intraoperative fluoroscopic images of the right hip demonstrate the patient be status post surgical internal fixation of proximal right femoral neck fracture. Good alignment of fracture components is noted. IMPRESSION: Status post surgical internal fixation of proximal right femoral neck fracture. Electronically Signed   By: Marijo Conception, M.D.   On: 03/21/2018 14:46   Dg Hip Unilat With Pelvis 2-3 Views Right  Result Date: 03/18/2018 CLINICAL DATA:  Pt missed a step at home and fell onto her right side this AM; pt complains of lateral right hip pain that radiates down right leg EXAM: DG HIP (WITH OR WITHOUT PELVIS) 2-3V RIGHT COMPARISON:  None. FINDINGS: Mildly impacted, minimally comminuted mid cervical fracture of the proximal right femur. No significant angulation deformity. No dislocation. Pelvis  intact. Mild spondylitic changes in the lower lumbar spine. IMPRESSION: 1. Impacted mid cervical fracture, proximal right femur. Electronically Signed   By: Lucrezia Europe M.D.   On: 03/18/2018 09:01      Subjective: Feeling well.  No chest pain, palpitations, dyspnea, leg swelling, orthopena.  No confusion.  No fever, cough, sputum.  Discharge Exam: Vitals:   03/24/18 0717 03/24/18 0825  BP:  130/79  Pulse:  91  Resp:  17  Temp:  98.2 F (36.8 C)  SpO2: 97%    Vitals:   03/23/18 2030 03/24/18 0425 03/24/18 0717 03/24/18 0825  BP: 114/69 132/68  130/79  Pulse:    91  Resp: 20 19  17   Temp:  97.6 F (36.4 C)  98.2 F (36.8 C)  TempSrc:  Oral  Oral  SpO2: 96% 96% 97%   Weight:      Height:        General: Pt is alert, awake, not in acute distress, sitting on edge of bed, husband at bedside Cardiovascular: Irregular rhythm but normal rate, S1/S2 +, sys murmur, soft Respiratory: CTA bilaterally, no wheezing, no rhonchi Abdominal: Soft, NT, ND, bowel sounds + Extremities: no edema, no cyanosis    The results of significant diagnostics  from this hospitalization (including imaging, microbiology, ancillary and laboratory) are listed below for reference.     Microbiology: Recent Results (from the past 240 hour(s))  MRSA PCR Screening     Status: None   Collection Time: 03/19/18  4:35 AM  Result Value Ref Range Status   MRSA by PCR NEGATIVE NEGATIVE Final    Comment:        The GeneXpert MRSA Assay (FDA approved for NASAL specimens only), is one component of a comprehensive MRSA colonization surveillance program. It is not intended to diagnose MRSA infection nor to guide or monitor treatment for MRSA infections. Performed at [facility], [place] [address] [place], [place] [address]      Labs: BNP (last 3 results) Recent Labs    03/18/18 0652  BNP 937.9*   Basic Metabolic Panel: Recent Labs  Lab 03/18/18 0652 03/18/18 1432  03/21/18 0227 03/22/18 0813 03/22/18 1102 03/23/18 0254 03/24/18 0353  NA 137  --    < > 135 134* 136 135 133*  K 3.8  --    < > 4.7 6.4* 4.4 5.4* 5.2*  CL 94*  --    < > 101 105 101 105 99  CO2 29  --    < > 26 17* 24 24 23   GLUCOSE 81  --    < > 75 81 98 93 79  BUN 14  --    < > 8 8 5* 14 24*  CREATININE 0.74  --    < > 0.62 0.64 0.70 0.95 0.99  CALCIUM 9.8  --    < > 8.6* 8.6* 8.7* 8.8* 8.8*  MG 1.9 1.7  --  2.2 2.2  --   --   --    < > = values in this interval not displayed.   Liver Function Tests: Recent Labs  Lab 03/18/18 0652  AST 20  ALT 24  ALKPHOS 53  BILITOT 1.0  PROT 6.4*  ALBUMIN 4.0   No results for input(s): LIPASE, AMYLASE in the last 168 hours. No results for input(s): AMMONIA in the last 168 hours. CBC: Recent Labs  Lab 03/18/18 0652 03/19/18 0505 03/20/18 0329 03/21/18 0227 03/23/18 0254 03/24/18 0353  WBC 12.3*  15.6* 11.8* 11.1* 8.4 7.7  NEUTROABS 8.8*  --   --   --  5.4  --   HGB 14.3 13.2 12.3 12.4 12.0 11.4*  HCT 43.1 40.4 37.8 38.9 38.6 35.7*  MCV 107.8* 110.7* 110.2* 111.8* 113.2* 110.9*  PLT 289 247 228 211 219 250    Cardiac Enzymes: No results for input(s): CKTOTAL, CKMB, CKMBINDEX, TROPONINI in the last 168 hours. BNP: Invalid input(s): POCBNP CBG: No results for input(s): GLUCAP in the last 168 hours. D-Dimer No results for input(s): DDIMER in the last 72 hours. Hgb A1c No results for input(s): HGBA1C in the last 72 hours. Lipid Profile No results for input(s): CHOL, HDL, LDLCALC, TRIG, CHOLHDL, LDLDIRECT in the last 72 hours. Thyroid function studies Recent Labs    03/22/18 1102  T3FREE 0.9*   Anemia work up Recent Labs    03/22/18 0305  VITAMINB12 265  FOLATE 8.0   Urinalysis No results found for: COLORURINE, APPEARANCEUR, LABSPEC, [place], GLUCOSEU, HGBUR, BILIRUBINUR, KETONESUR, PROTEINUR, UROBILINOGEN, NITRITE, LEUKOCYTESUR Sepsis Labs Invalid input(s): PROCALCITONIN,  WBC,  LACTICIDVEN Microbiology Recent Results (from the past 240 hour(s))  MRSA PCR Screening     Status: None   Collection Time: 03/19/18  4:35 AM  Result Value Ref Range Status   MRSA by PCR NEGATIVE NEGATIVE Final    Comment:        The GeneXpert MRSA Assay (FDA approved for NASAL specimens only), is one component of a comprehensive MRSA colonization surveillance program. It is not intended to diagnose MRSA infection nor to guide or monitor treatment for MRSA infections. Performed at [facility], [place] [address], [place], [place] [address]      Time coordinating discharge: 35 minutes The [place] controlled substances registry was reviewed for this patient prior to filling the <5 days supply controlled substances script.      SIGNED:   Edwin Dada, MD  [facility] 03/24/2018, 4:26 PM

## 2018-03-24 NOTE — Progress Notes (Signed)
    Subjective:  Patient reports pain as mild to moderate.  Denies N/V/CP/SOB.   Objective:   VITALS:   Vitals:   03/23/18 2030 03/24/18 0425 03/24/18 0717 03/24/18 0825  BP: 114/69 132/68  130/79  Pulse:    91  Resp: 20 19  17   Temp:  97.6 F (36.4 C)  98.2 F (36.8 C)  TempSrc:  Oral  Oral  SpO2: 96% 96% 97%   Weight:      Height:        NAD ABD soft Sensation intact distally Intact pulses distally Dorsiflexion/Plantar flexion intact Incision: dressing C/D/I Compartment soft   Lab Results  Component Value Date   WBC 7.7 03/24/2018   HGB 11.4 (L) 03/24/2018   HCT 35.7 (L) 03/24/2018   MCV 110.9 (H) 03/24/2018   PLT 250 03/24/2018   BMET    Component Value Date/Time   NA 133 (L) 03/24/2018 0353   NA 138 12/21/2017 1214   K 5.2 (H) 03/24/2018 0353   CL 99 03/24/2018 0353   CO2 23 03/24/2018 0353   GLUCOSE 79 03/24/2018 0353   GLUCOSE 83 06/01/2006 0950   BUN 24 (H) 03/24/2018 0353   BUN 15 12/21/2017 1214   CREATININE 0.99 03/24/2018 0353   CALCIUM 8.8 (L) 03/24/2018 0353   GFRNONAA 55 (L) 03/24/2018 0353   GFRAA >60 03/24/2018 0353     Assessment/Plan: 3 Days Post-Op   Principal Problem:   Fracture of femoral neck, right (HCC) Active Problems:   Atrial fibrillation (HCC)   Atrial fibrillation with RVR ([place])   WBAT with walker DVT ppx: apixaban, SCDs, TEDS PO pain control PT/OT Dispo: CIR    Hilton Cork Mj Willis 03/24/2018, 1:43 PM   Rod Can, MD Cell [phone]

## 2018-03-24 NOTE — Progress Notes (Signed)
Patient ID: TAILEY TOP, female   DOB: 23-Sep-1945, 72 y.o.   MRN: [id number] Patient admitted to 4W01 via bed, escorted by nursing staff and family.  Patient and family verbalized understanding of rehab process, with emphasis on fall prevention safety plan, personal belongings policy, and visitation policy.  Patient appears to be in no immediate distress at this time.  Brita Romp, RN

## 2018-03-24 NOTE — Progress Notes (Addendum)
Progress Note  Patient Name: Crystal [name] Date of Encounter: 03/24/2018  Primary Cardiologist: [name], MD   Subjective  No CP  No dizziness  No SOB   Inpatient Medications    Scheduled Meds: . amiodarone  200 mg Oral BID  . apixaban  5 mg Oral BID  . calcium-vitamin D  1 tablet Oral Q breakfast  . cholecalciferol  3,000 Units Oral Q lunch  . docusate sodium  100 mg Oral BID  . gabapentin  500 mg Oral QHS  . levothyroxine  25 mcg Oral QAC breakfast  . LORazepam  1 mg Oral QHS  . metoprolol tartrate  50 mg Oral BID  . mometasone-formoterol  2 puff Inhalation BID  . montelukast  10 mg Oral QHS  . predniSONE  15 mg Oral Q breakfast  . saccharomyces boulardii  250 mg Oral Daily  . senna  1 tablet Oral BID   Continuous Infusions: . lactated ringers     PRN Meds: acetaminophen, albuterol, fentaNYL (SUBLIMAZE) injection, menthol-cetylpyridinium **OR** phenol, metoCLOPramide **OR** metoCLOPramide (REGLAN) injection, ondansetron **OR** ondansetron (ZOFRAN) IV, oxyCODONE, traMADol   Vital Signs    Vitals:   03/23/18 2030 03/24/18 0425 03/24/18 0717 03/24/18 0825  BP: 114/69 132/68  130/79  Pulse:    91  Resp: 20 19  17   Temp:  97.6 F (36.4 C)  98.2 F (36.8 C)  TempSrc:  Oral  Oral  SpO2: 96% 96% 97%   Weight:      Height:        Intake/Output Summary (Last 24 hours) at 03/24/2018 1138 Last data filed at 03/24/2018 0000 Gross per 24 hour  Intake -  Output 0 ml  Net 0 ml   Filed Weights   03/18/18 0627 03/21/18 1117  Weight: 50.8 kg 50 kg    Telemetry    afib with RVR   Rates 80s t - Personally Reviewed  ECG      Physical Exam  Thin 72 yo in NAD   GEN: No acute distress.   Neck: JVP is normal    Cardiac: Irreg irreg  No rubs, or gallops. Or murmurs   Respiratory: Clear to auscultation bilaterally. GI: Soft, nontender, non-distended  MS: No edema; No deformity. Neuro:  Nonfocal  Psych: Normal affect   Labs    Chemistry Recent Labs    Lab 03/18/18 0652  03/22/18 1102 03/23/18 0254 03/24/18 0353  NA 137   < > 136 135 133*  K 3.8   < > 4.4 5.4* 5.2*  CL 94*   < > 101 105 99  CO2 29   < > 24 24 23   GLUCOSE 81   < > 98 93 79  BUN 14   < > 5* 14 24*  CREATININE 0.74   < > 0.70 0.95 0.99  CALCIUM 9.8   < > 8.7* 8.8* 8.8*  PROT 6.4*  --   --   --   --   ALBUMIN 4.0  --   --   --   --   AST 20  --   --   --   --   ALT 24  --   --   --   --   ALKPHOS 53  --   --   --   --   BILITOT 1.0  --   --   --   --   GFRNONAA >60   < > >60 58* 55*  GFRAA >60   < > >  60 >60 >60  ANIONGAP 14   < > 11 6 11    < > = values in this interval not displayed.     Hematology Recent Labs  Lab 03/21/18 0227 03/23/18 0254 03/24/18 0353  WBC 11.1* 8.4 7.7  RBC 3.48* 3.41* 3.22*  HGB 12.4 12.0 11.4*  HCT 38.9 38.6 35.7*  MCV 111.8* 113.2* 110.9*  MCH 35.6* 35.2* 35.4*  MCHC 31.9 31.1 31.9  RDW 16.4* 16.7* 16.3*  PLT 211 219 250    Cardiac EnzymesNo results for input(s): TROPONINI in the last 168 hours. No results for input(s): TROPIPOC in the last 168 hours.   BNP Recent Labs  Lab 03/18/18 0652  BNP 534.0*     DDimer No results for input(s): DDIMER in the last 168 hours.   Radiology    No results found.  Cardiac Studies     Patient Profile     Crystal [name] a 72 y.o.femalewith past medical history of PAF (on Eliquis), giant cell arteritis (now legally blind), HTN, HLD, and PVD (known right SFA occlusion)who we were asked to see for new onset A. fib with RVR in the setting of a hip fracture and extreme pain on Eliquis oral anticoagulation at home.  She is scheduled for right hip orthopedic surgical procedure tomorrow by Dr. Lyla Glassing.  Assessment & Plan    1  Atrial fib  Rates OK   ON oral agents   Keep on Eliquis   WIll adjust as outpt    COntinue metoprolol   Continue amiodarone 200 bid for 2 wks then cut back to 200 daily   2  Ortho  Now post op  Plan for rehab   3   Thyroid   TSH a little high  Free t3 a  little low T 4 normal   Would Rx with low dose F/U as outpatient since on amiodarone  OK to move to rehab   I will make sure she has outpatient follow up    Will be available as needed for BP/ HR questions.    Call with questions.      For questions or updates, please contact [place] Please consult [email or web address] for contact info under

## 2018-03-24 NOTE — Progress Notes (Signed)
Physical Medicine and Rehabilitation Consult Reason for Consult: Decreased functional mobility Referring Physician: [facility]     HPI: Crystal [name] is a 72 y.o. right-handed female with history of atrial fibrillation maintained on Eliquis, temporal arteritis/legally blind and maintained on chronic prednisone, hypertension.  Per chart review patient lives with spouse.  Multilevel home with bedroom on [address].  2 steps to entry.  Reported to be using a cane prior to admission and needing assistance due to blindness.  They have a son in the area that works.  Husband can assist but also uses a cane.  Presented 03/18/2018 after mechanical fall.  No loss of consciousness.  Cranial CT scan as well as CT cervical spine negative.  X-rays and imaging revealed right femoral neck fracture.  Preoperative clearance by cardiology services and changed to IV heparin with Eliquis held with plan for surgical intervention.  Underwent percutaneous screw fixation 03/21/2018 per Dr. Lyla Glassing.  Hospital course pain management.  Weightbearing as tolerated right lower extremity.  Eliquis is since been resumed as well as maintained on amiodarone.  Therapy evaluations completed with recommendations of physical medicine rehab consult     Review of Systems  Constitutional: Negative for chills and fever.  HENT: Negative for hearing loss.   Eyes: Negative for blurred vision and double vision.       Blindness  Respiratory: Negative for cough and shortness of breath.   Cardiovascular: Positive for palpitations.  Gastrointestinal: Positive for constipation. Negative for nausea and vomiting.  Genitourinary: Negative for dysuria and hematuria.  Musculoskeletal: Positive for falls and myalgias.  Skin: Negative for rash.  All other systems reviewed and are negative.       Past Medical History:  Diagnosis Date  . Adenomatous colon polyp 2007 & 2010     Dr [name]  . Allergy      seasonal  . Arthritis    . Cataract    . Diverticulosis      . 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 Hyperlipidemia    . Hypertension    . IBS (irritable bowel syndrome)    . Legally blind    . MVP (mitral valve prolapse)    . Paroxysmal atrial fibrillation with rapid ventricular response ([place]) 08/29/2017  . Peripheral vascular disease ([place])    . RAD (reactive airway disease)           Past Surgical History:  Procedure Laterality Date  . ABDOMINAL HYSTERECTOMY        with USO for dysfunctionall menses  . APPENDECTOMY      . ARTERY BIOPSY Bilateral 07/21/2017    Procedure: BIOPSY BILATERAL TEMPORAL ARTERY;  Surgeon: [name], MD;  Location: [facility];  Service: Vascular;  Laterality: Bilateral;  . CATARACT EXTRACTION W/ INTRAOCULAR LENS IMPLANT   2013    bilateral; Dr [name]  . COLONOSCOPY   2013    negative , due 2018; Dr [name]  . COLONOSCOPY W/ POLYPECTOMY         X2 ; diverticulosis  . EMBOLECTOMY Right 08/29/2017    Procedure: Right Groin Exploration  ;  Surgeon: Rosetta Posner, MD;  Location: [facility];  Service: Vascular;  Laterality: Right;  . HIP PINNING,CANNULATED Right 03/21/2018  . HIP PINNING,CANNULATED Right 03/21/2018    Procedure: CANNULATED HIP PINNING;  Surgeon: Rod Can, MD;  Location: [facility];  Service: Orthopedics;  Laterality: Right;  . LUMBAR LAMINECTOMY   2000    Dr Vertell Limber  . SHOULDER SURGERY  R shoulder  . TONSILLECTOMY             Family History  Problem Relation Age of Onset  . Heart attack Brother 9  . Parkinsonism Mother    . Stroke Mother    . Heart attack Father          pre 75  . Diabetes Sister    . COPD Sister          X 3  . Other Son          suicide 2016  . Colon cancer Neg Hx    . Cancer Neg Hx    . Esophageal cancer Neg Hx    . Liver cancer Neg Hx    . Pancreatic cancer Neg Hx    . Rectal cancer Neg Hx    . Stomach cancer Neg Hx      Social History:  reports that she quit smoking about 28 years ago. She has a 23.00 pack-year smoking history. She has never  used smokeless tobacco. She reports that she drinks about 14.0 standard drinks of alcohol per week. She reports that she does not use drugs. Allergies:       Allergies  Allergen Reactions  . Simvastatin        myalgias    Medications Prior to Admission  Medication Sig Dispense Refill  . acetaminophen (TYLENOL) 500 MG tablet Take 1,000 mg by mouth every 6 (six) hours as needed for headache.      . alendronate (FOSAMAX) 70 MG tablet Take 70 mg by mouth once a week. On Friday   0  . apixaban (ELIQUIS) 5 MG TABS tablet Take 1 tablet (5 mg total) by mouth 2 (two) times daily. 60 tablet 6  . Calcium Carbonate-Vitamin D (CALCIUM 600/VITAMIN D PO) Take 1 tablet by mouth daily.       Marland Kitchen CARTIA XT 120 MG 24 hr capsule Take 120 mg by mouth daily.    10  . Cholecalciferol (VITAMIN D3) 1000 UNITS CAPS Take 1,000 Units by mouth daily with lunch.       . diltiazem (CARDIZEM) 30 MG tablet Take 1 tablet (30 mg total) by mouth once as needed for up to 1 dose. (Patient taking differently: Take 30 mg by mouth once as needed (for afib). ) 14 tablet 0  . furosemide (LASIX) 40 MG tablet TAKE ONE TABLET BY MOUTH DAILY (Patient taking differently: Take 40 mg by mouth daily. ) 90 tablet 3  . gabapentin (NEURONTIN) 100 MG capsule Take 800-900 mg by mouth at bedtime.    0  . LORazepam (ATIVAN) 1 MG tablet Take 1 tablet (1 mg total) by mouth at bedtime. 30 tablet 2  . montelukast (SINGULAIR) 10 MG tablet Take 1 tablet (10 mg total) by mouth daily. 90 tablet 1  . potassium chloride SA (K-DUR,KLOR-CON) 20 MEQ tablet Take 2 tablets (40 mEq total) by mouth daily. 60 tablet 5  . pravastatin (PRAVACHOL) 40 MG tablet TAKE 1 TABLET DAILY BY MOUTH AT BEDTIME (Patient taking differently: Take 40 mg by mouth at bedtime. ) 90 tablet 1  . predniSONE (DELTASONE) 5 MG tablet Take 7.5 mg by mouth daily with breakfast.      . saccharomyces boulardii (FLORASTOR) 250 MG capsule Take 250 mg by mouth daily.      . SYMBICORT 160-4.5 MCG/ACT  inhaler INHALE 1 OR 2 PUFFS INTO THE LUNGS EVERY 12 HOURS. THEN GARGLE AND SPIT AFTER USE (Patient taking differently: Inhale 2  puffs into the lungs 2 (two) times daily. ) 6 g 10  . theophylline (UNIPHYL) 400 MG 24 hr tablet TAKE 1 TABLET (400 MG TOTAL) BY MOUTH DAILY. (Patient taking differently: Take 200 mg by mouth 2 (two) times daily. ) 90 tablet 3  . traMADol (ULTRAM) 50 MG tablet Take 1-2 tablets (50-100 mg total) by mouth every 8 (eight) hours as needed. (Patient taking differently: Take 50 mg by mouth every 8 (eight) hours as needed for moderate pain. ) 60 tablet 0  . VENTOLIN HFA 108 (90 Base) MCG/ACT inhaler Inhale 1-2 puffs into the lungs every 6 (six) hours as needed for wheezing or shortness of breath. 1 Inhaler 8      Home: Home Living Family/patient expects to be discharged to:: Private residence Living Arrangements: Spouse/significant other Available Help at Discharge: Family, Available 24 hours/day Type of Home: House Home Access: Stairs to enter [employer] of Steps: 2 steeper steps in garage with rail, 2-3 smaller steps no rail in front Entrance Stairs-Rails: Left Home Layout: Able to live on main level with bedroom/bathroom, Multi-level Alternate Level Stairs-Number of Steps: can stay on main Bathroom Shower/Tub: Multimedia programmer: Standard Home Equipment: Other (comment)(white cane)  Functional History: Prior Function Level of Independence: Independent Comments: Pt was performing ADLs independently. Husband assisting with IADLs. Recently started OT to adapt and modify home environment for vision loss. Functional Status:  Mobility: Bed Mobility Overal bed mobility: Needs Assistance Bed Mobility: Supine to Sit Supine to sit: Mod assist General bed mobility comments: assist for scooting and lifting trunk with cues for technique Transfers Overall transfer level: Needs assistance Equipment used: Rolling walker (2 wheeled) Transfers: Sit  to/from Stand, Stand Pivot Transfers Sit to Stand: Mod assist Stand pivot transfers: Min assist General transfer comment: some lifting assist with mod cues for technique from EOB and from 3:1; pivot to 3:1 with RW, cues and assist for balance Ambulation/Gait Ambulation/Gait assistance: Min assist, +2 safety/equipment Gait Distance (Feet): 26 Feet Assistive device: Rolling walker (2 wheeled) Gait Pattern/deviations: Step-to pattern, Decreased stride length, Antalgic General Gait Details: limited tolerance to weight on R LE, cues and assist for walker management and positioning, son and daughter present and engaged to assist as well   ADL: ADL Overall ADL's : Needs assistance/impaired Eating/Feeding: With caregiver independent assisting, Sitting, Set up(Cues for placement) Eating/Feeding Details (indicate cue type and reason): Husband assisting to prep food due to vision deficits Grooming: Set up, Supervision/safety, Sitting Upper Body Bathing: Set up, Supervision/ safety, Sitting Lower Body Bathing: Minimal assistance, Sit to/from stand Upper Body Dressing : Set up, Supervision/safety Lower Body Dressing: Moderate assistance, Sit to/from stand Lower Body Dressing Details (indicate cue type and reason): Mod A for donning clothing over RLE and manage over hips. Min A for sit<>stand Toilet Transfer: Minimal assistance, 57, RW, Cueing for sequencing Toilet Transfer Details (indicate cue type and reason): Min A for power up into standing and then maintain balance during pivot. Cues for sequencign and RW management Functional mobility during ADLs: Minimal assistance, Rolling walker General ADL Comments: Pt with decreased functional performance due to pain at RLE and vision deficits. Pt motivated to return home and increased independence   Cognition: Cognition Overall Cognitive Status: Within Functional Limits for tasks assessed Orientation Level: Oriented  X4 Cognition Arousal/Alertness: Awake/alert Behavior During Therapy: WFL for tasks assessed/performed Overall Cognitive Status: Within Functional Limits for tasks assessed General Comments: Very motivated   Blood pressure (!) 90/59, pulse 100, temperature (!) 87.8 F (  31 C), temperature source Oral, resp. rate 15, height 5\' 3"  (1.6 m), weight 50 kg, SpO2 96 %. Physical Exam  Constitutional: She is oriented to person, place, and time. She appears well-developed. No distress.  HENT:  Head: Normocephalic.  Eyes: Pupils are equal, round, and reactive to light.  Totally blind  Neck: Normal range of motion.  Cardiovascular: Regular rhythm.  tachycardic  Respiratory: Effort normal.  GI: Soft.  Musculoskeletal:  Edema and tenderness right hip  Neurological: She is alert and oriented to person, place, and time.  Patient is alert.  Follows full commands.  Oriented to person, place and time.  Can not see even light or dark. UE 5/5. RLE limited by pain. LLE 4/5. No sensory deficits  Skin: Skin is warm.  Bruising right hip, wound with foam dressing  Psychiatric: She has a normal mood and affect. Her behavior is normal.      Lab Results Last 24 Hours  Results for orders placed or performed during the hospital encounter of 03/18/18 (from the past 24 hour(s))  Basic metabolic panel     Status: Abnormal    Collection Time: 03/22/18  8:13 AM  Result Value Ref Range    Sodium 134 (L) 135 - 145 mmol/L    Potassium 6.4 (HH) 3.5 - 5.1 mmol/L    Chloride 105 98 - 111 mmol/L    CO2 17 (L) 22 - 32 mmol/L    Glucose, Bld 81 70 - 99 mg/dL    BUN 8 8 - 23 mg/dL    Creatinine, Ser 0.64 0.44 - 1.00 mg/dL    Calcium 8.6 (L) 8.9 - 10.3 mg/dL    GFR calc non Af Amer >60 >60 mL/min    GFR calc Af Amer >60 >60 mL/min    Anion gap 12 5 - 15  Magnesium     Status: None    Collection Time: 03/22/18  8:13 AM  Result Value Ref Range    Magnesium 2.2 1.7 - 2.4 mg/dL  T4, free     Status: None    Collection  Time: 03/22/18 11:02 AM  Result Value Ref Range    Free T4 1.04 0.82 - 1.77 ng/dL  T3, free     Status: Abnormal    Collection Time: 03/22/18 11:02 AM  Result Value Ref Range    T3, Free 0.9 (L) 2.0 - 4.4 pg/mL  Basic metabolic panel     Status: Abnormal    Collection Time: 03/22/18 11:02 AM  Result Value Ref Range    Sodium 136 135 - 145 mmol/L    Potassium 4.4 3.5 - 5.1 mmol/L    Chloride 101 98 - 111 mmol/L    CO2 24 22 - 32 mmol/L    Glucose, Bld 98 70 - 99 mg/dL    BUN 5 (L) 8 - 23 mg/dL    Creatinine, Ser 0.70 0.44 - 1.00 mg/dL    Calcium 8.7 (L) 8.9 - 10.3 mg/dL    GFR calc non Af Amer >60 >60 mL/min    GFR calc Af Amer >60 >60 mL/min    Anion gap 11 5 - 15  Basic metabolic panel     Status: Abnormal    Collection Time: 03/23/18  2:54 AM  Result Value Ref Range    Sodium 135 135 - 145 mmol/L    Potassium 5.4 (H) 3.5 - 5.1 mmol/L    Chloride 105 98 - 111 mmol/L    CO2 24 22 - 32 mmol/L  Glucose, Bld 93 70 - 99 mg/dL    BUN 14 8 - 23 mg/dL    Creatinine, Ser 0.95 0.44 - 1.00 mg/dL    Calcium 8.8 (L) 8.9 - 10.3 mg/dL    GFR calc non Af Amer 58 (L) >60 mL/min    GFR calc Af Amer >60 >60 mL/min    Anion gap 6 5 - 15  CBC with Differential/Platelet     Status: Abnormal    Collection Time: 03/23/18  2:54 AM  Result Value Ref Range    WBC 8.4 4.0 - 10.5 K/uL    RBC 3.41 (L) 3.87 - 5.11 MIL/uL    Hemoglobin 12.0 12.0 - 15.0 g/dL    HCT 38.6 36.0 - 46.0 %    MCV 113.2 (H) 80.0 - 100.0 fL    MCH 35.2 (H) 26.0 - 34.0 pg    MCHC 31.1 30.0 - 36.0 g/dL    RDW 16.7 (H) 11.5 - 15.5 %    Platelets 219 150 - 400 K/uL    nRBC 0.0 0.0 - 0.2 %    Neutrophils Relative % 64 %    Neutro Abs 5.4 1.7 - 7.7 K/uL    Lymphocytes Relative 22 %    Lymphs Abs 1.8 0.7 - 4.0 K/uL    Monocytes Relative 12 %    Monocytes Absolute 1.0 0.1 - 1.0 K/uL    Eosinophils Relative 1 %    Eosinophils Absolute 0.1 0.0 - 0.5 K/uL    Basophils Relative 1 %    Basophils Absolute 0.1 0.0 - 0.1 K/uL     nRBC 0 0 /100 WBC    Polychromasia PRESENT         Imaging Results (Last 48 hours)  Pelvis Portable   Result Date: 03/21/2018 CLINICAL DATA:  72 year old female with hip fracture. Subsequent encounter. EXAM: PORTABLE PELVIS 1-2 VIEWS COMPARISON:  03/21/2018 intraoperative films. FINDINGS: Three pins traverse right subcapital fracture. Pins appear in appropriate position on this frontal projection. IMPRESSION: Post pinning right femoral neck fracture. Electronically Signed   By: Genia Del M.D.   On: 03/21/2018 16:27    Dg C-arm 1-60 Min   Result Date: 03/21/2018 CLINICAL DATA:  Surgical internal fixation of right hip fracture. EXAM: OPERATIVE right HIP (WITH PELVIS IF PERFORMED) 3 VIEWS TECHNIQUE: Fluoroscopic spot image(s) were submitted for interpretation post-operatively. FLUOROSCOPY TIME:  1 minutes 34 seconds. COMPARISON:  Radiographs of March 18, 2018. FINDINGS: Three intraoperative fluoroscopic images of the right hip demonstrate the patient be status post surgical internal fixation of proximal right femoral neck fracture. Good alignment of fracture components is noted. IMPRESSION: Status post surgical internal fixation of proximal right femoral neck fracture. Electronically Signed   By: Marijo Conception, M.D.   On: 03/21/2018 14:46    Dg Hip Operative Unilat W Or W/o Pelvis Right   Result Date: 03/21/2018 CLINICAL DATA:  Surgical internal fixation of right hip fracture. EXAM: OPERATIVE right HIP (WITH PELVIS IF PERFORMED) 3 VIEWS TECHNIQUE: Fluoroscopic spot image(s) were submitted for interpretation post-operatively. FLUOROSCOPY TIME:  1 minutes 34 seconds. COMPARISON:  Radiographs of March 18, 2018. FINDINGS: Three intraoperative fluoroscopic images of the right hip demonstrate the patient be status post surgical internal fixation of proximal right femoral neck fracture. Good alignment of fracture components is noted. IMPRESSION: Status post surgical internal fixation of proximal right  femoral neck fracture. Electronically Signed   By: Marijo Conception, M.D.   On: 03/21/2018 14:46  Assessment/Plan: Diagnosis: Right femoral neck fracture after fall s/p percutaneous screw. Legally blind 1. Does the need for close, 24 hr/day medical supervision in concert with the patient's rehab needs make it unreasonable for this patient to be served in a less intensive setting? Yes 2. Co-Morbidities requiring supervision/potential complications: afib with RVR, temporal arteritis, HTN, hyperkalemia 3. Due to bladder management, bowel management, safety, skin/wound care, disease management, medication administration, pain management and patient education, does the patient require 24 hr/day rehab nursing? Yes 4. Does the patient require coordinated care of a physician, rehab nurse, PT (1-2 hrs/day, 5 days/week) and OT (1-2 hrs/day, 5 days/week) to address physical and functional deficits in the context of the above medical diagnosis(es)? Yes Addressing deficits in the following areas: balance, endurance, locomotion, strength, transferring, bowel/bladder control, bathing, dressing, feeding, grooming, toileting and psychosocial support 5. Can the patient actively participate in an intensive therapy program of at least 3 hrs of therapy per day at least 5 days per week? Yes 6. The potential for patient to make measurable gains while on inpatient rehab is excellent 7. Anticipated functional outcomes upon discharge from inpatient rehab are modified independent and supervision  with PT, modified independent and supervision with OT, n/a with SLP. 8. Estimated rehab length of stay to reach the above functional goals is: 10-15 days 9. Anticipated D/C setting: Home 10. Anticipated post D/C treatments: HH therapy and Outpatient therapy 11. Overall Rehab/Functional Prognosis: excellent   RECOMMENDATIONS: This patient's condition is appropriate for continued rehabilitative care in the following setting:  CIR Patient has agreed to participate in recommended program. Yes Note that insurance prior authorization may be required for reimbursement for recommended care.   Comment: Rehab Admissions Coordinator to follow up.   Thanks,    Meredith Staggers, MD, Mellody Drown   I have personally performed a face to face diagnostic evaluation of this patient. Additionally, I have reviewed and concur with the physician assistant's documentation above.     Lavon Paganini Angiulli, PA-C 03/23/2018        Revision History                       Routing History

## 2018-03-24 NOTE — Clinical Social Work Note (Signed)
PASARR under manual review. Uploaded requested documents into [facility] for PASARR review.  Dayton Scrape, [place]

## 2018-03-24 NOTE — Progress Notes (Signed)
RN came in a 11pm and notice there wasn't a note written about pt going into a-flutter on the 7pm strip yesterday. MD on call paged at this time. Agricultural consultant notified. Awaiting any new orders at this time

## 2018-03-25 ENCOUNTER — Inpatient Hospital Stay (HOSPITAL_COMMUNITY): Payer: Medicare Other | Admitting: Occupational Therapy

## 2018-03-25 ENCOUNTER — Inpatient Hospital Stay (HOSPITAL_COMMUNITY): Payer: Medicare Other | Admitting: Physical Therapy

## 2018-03-25 DIAGNOSIS — H543 Unqualified visual loss, both eyes: Secondary | ICD-10-CM

## 2018-03-25 DIAGNOSIS — S72001A Fracture of unspecified part of neck of right femur, initial encounter for closed fracture: Secondary | ICD-10-CM

## 2018-03-25 MED ORDER — METOPROLOL TARTRATE 25 MG PO TABS
25.0000 mg | ORAL_TABLET | Freq: Two times a day (BID) | ORAL | Status: DC
Start: 2018-03-26 — End: 2018-03-25
  Filled 2018-03-25: qty 1

## 2018-03-25 MED ORDER — METOPROLOL TARTRATE 25 MG PO TABS
25.0000 mg | ORAL_TABLET | Freq: Two times a day (BID) | ORAL | Status: DC
  Administered 2018-03-25 – 2018-03-30 (×10): 25 mg via ORAL
  Filled 2018-03-25 (×9): qty 1

## 2018-03-25 NOTE — H&P (Addendum)
Physical Medicine and Rehabilitation Admission H&P        Chief Complaint  Patient presents with  . 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  Per chart review patient lives with spouse.  Multilevel home with bedroom on main level.  2 steps to entry.  Reported to be using a cane prior to admission and needing assistance due to blindness.  They have a son in the area that works.  Husband can assist but also uses a cane.  Presented 03/18/2018 after mechanical fall.  No loss of consciousness.  Cranial CT scan as well as CT cervical spine negative.  X-rays imaging revealed right femoral neck fracture.  Preoperative clearance by cardiology services initially transitioned to IV heparin with Eliquis held with plan for surgical intervention and underwent percutaneous screw fixation 03/21/2018 per Dr. Lyla Glassing.  Hospital course pain management.  Weightbearing as tolerated right lower extremity.  Eliquis has been resumed for atrial fibrillation as well as maintained on amiodarone.  Therapy evaluations completed with recommendations of physical medicine rehab consult.  Patient was admitted for a comprehensive rehab program.   Review of Systems  Constitutional: Negative for chills and fever.  HENT: Negative for hearing loss.   Eyes:       Blindness  Respiratory: Negative for cough and shortness of breath.   Cardiovascular: Positive for palpitations. Negative for leg swelling.  Gastrointestinal: Positive for constipation. Negative for nausea and vomiting.  Genitourinary: Negative for flank pain and hematuria.  Musculoskeletal: Positive for falls and myalgias.  Skin: Negative for rash.  All other systems reviewed and are negative.       Past Medical History:  Diagnosis Date  . Adenomatous colon polyp 2007 & 2010     Dr [name]  . Allergy        seasonal  . Arthritis    . Cataract    . Diverticulosis    . 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 Hyperlipidemia    . Hypertension    . IBS (irritable bowel syndrome)    . Legally blind    . MVP (mitral valve prolapse)    . Paroxysmal atrial fibrillation with rapid ventricular response ([place]) 08/29/2017  . Peripheral vascular disease ([place])    . RAD (reactive airway disease)           Past Surgical History:  Procedure Laterality Date  . ABDOMINAL HYSTERECTOMY        with USO for dysfunctionall menses  . APPENDECTOMY      . ARTERY BIOPSY Bilateral 07/21/2017    Procedure: BIOPSY BILATERAL TEMPORAL ARTERY;  Surgeon: [name], MD;  Location: [facility];  Service: Vascular;  Laterality: Bilateral;  . CATARACT EXTRACTION W/ INTRAOCULAR LENS IMPLANT   2013    bilateral; Dr [name]  . COLONOSCOPY   2013    negative , due 2018; Dr [name]  . COLONOSCOPY W/ POLYPECTOMY         X2 ; diverticulosis  . EMBOLECTOMY Right 08/29/2017    Procedure: Right Groin Exploration  ;  Surgeon: Rosetta Posner, MD;  Location: [facility];  Service: Vascular;  Laterality: Right;  . HIP PINNING,CANNULATED Right 03/21/2018  . HIP PINNING,CANNULATED Right 03/21/2018    Procedure: CANNULATED HIP PINNING;  Surgeon: Rod Can, MD;  Location: [facility];  Service: Orthopedics;  Laterality: Right;  . LUMBAR LAMINECTOMY  2000    Dr Vertell Limber  . SHOULDER SURGERY        R shoulder  . TONSILLECTOMY             Family History  Problem Relation Age of Onset  . Heart attack Brother 62  . Parkinsonism Mother    . Stroke Mother    . Heart attack Father          pre 24  . Diabetes Sister    . COPD Sister          X 3  . Other Son          suicide 2016  . Colon cancer Neg Hx    . Cancer Neg Hx    . Esophageal cancer Neg Hx    . Liver cancer Neg Hx    . Pancreatic cancer Neg Hx    . Rectal cancer Neg Hx    . Stomach cancer Neg Hx      Social History:  reports that she quit smoking about 28 years  ago. She has a 23.00 pack-year smoking history. She has never used smokeless tobacco. She reports that she drinks about 14.0 standard drinks of alcohol per week. She reports that she does not use drugs. Allergies:       Allergies  Allergen Reactions  . Simvastatin        myalgias          Medications Prior to Admission  Medication Sig Dispense Refill  . acetaminophen (TYLENOL) 500 MG tablet Take 1,000 mg by mouth every 6 (six) hours as needed for headache.      . alendronate (FOSAMAX) 70 MG tablet Take 70 mg by mouth once a week. On Friday   0  . apixaban (ELIQUIS) 5 MG TABS tablet Take 1 tablet (5 mg total) by mouth 2 (two) times daily. 60 tablet 6  . Calcium Carbonate-Vitamin D (CALCIUM 600/VITAMIN D PO) Take 1 tablet by mouth daily.       Marland Kitchen CARTIA XT 120 MG 24 hr capsule Take 120 mg by mouth daily.    10  . Cholecalciferol (VITAMIN D3) 1000 UNITS CAPS Take 1,000 Units by mouth daily with lunch.       . diltiazem (CARDIZEM) 30 MG tablet Take 1 tablet (30 mg total) by mouth once as needed for up to 1 dose. (Patient taking differently: Take 30 mg by mouth once as needed (for afib). ) 14 tablet 0  . furosemide (LASIX) 40 MG tablet TAKE ONE TABLET BY MOUTH DAILY (Patient taking differently: Take 40 mg by mouth daily. ) 90 tablet 3  . gabapentin (NEURONTIN) 100 MG capsule Take 800-900 mg by mouth at bedtime.    0  . LORazepam (ATIVAN) 1 MG tablet Take 1 tablet (1 mg total) by mouth at bedtime. 30 tablet 2  . montelukast (SINGULAIR) 10 MG tablet Take 1 tablet (10 mg total) by mouth daily. 90 tablet 1  . potassium chloride SA (K-DUR,KLOR-CON) 20 MEQ tablet Take 2 tablets (40 mEq total) by mouth daily. 60 tablet 5  . pravastatin (PRAVACHOL) 40 MG tablet TAKE 1 TABLET DAILY BY MOUTH AT BEDTIME (Patient taking differently: Take 40 mg by mouth at bedtime. ) 90 tablet 1  . predniSONE (DELTASONE) 5 MG tablet Take 7.5 mg by mouth daily with breakfast.      . saccharomyces boulardii (FLORASTOR) 250 MG  capsule Take 250 mg by mouth daily.      . SYMBICORT 160-4.5 MCG/ACT  inhaler INHALE 1 OR 2 PUFFS INTO THE LUNGS EVERY 12 HOURS. THEN GARGLE AND SPIT AFTER USE (Patient taking differently: Inhale 2 puffs into the lungs 2 (two) times daily. ) 6 g 10  . theophylline (UNIPHYL) 400 MG 24 hr tablet TAKE 1 TABLET (400 MG TOTAL) BY MOUTH DAILY. (Patient taking differently: Take 200 mg by mouth 2 (two) times daily. ) 90 tablet 3  . traMADol (ULTRAM) 50 MG tablet Take 1-2 tablets (50-100 mg total) by mouth every 8 (eight) hours as needed. (Patient taking differently: Take 50 mg by mouth every 8 (eight) hours as needed for moderate pain. ) 60 tablet 0      Drug Regimen Review Drug regimen was reviewed and remains appropriate with no significant issues identified   Home: Home Living Family/patient expects to be discharged to:: Private residence Living Arrangements: Spouse/significant other Available Help at Discharge: Family, Available 24 hours/day Type of Home: House Home Access: Stairs to enter [employer] of Steps: 2 steeper steps in garage with rail, 2-3 smaller steps no rail in front Entrance Stairs-Rails: Left Home Layout: Able to live on main level with bedroom/bathroom, Multi-level Alternate Level Stairs-Number of Steps: can stay on main Bathroom Shower/Tub: Multimedia programmer: Standard Home Equipment: Other (comment)(white cane)   Functional History: Prior Function Level of Independence: Independent Comments: Pt was performing ADLs independently. Husband assisting with IADLs. Recently started OT to adapt and modify home environment for vision loss.   Functional Status:  Mobility: Bed Mobility Overal bed mobility: Needs Assistance Bed Mobility: Supine to Sit Supine to sit: Mod assist General bed mobility comments: assist for scooting and lifting trunk with cues for technique Transfers Overall transfer level: Needs assistance Equipment used: Rolling walker (2  wheeled) Transfers: Sit to/from Stand, Stand Pivot Transfers Sit to Stand: Mod assist Stand pivot transfers: Min assist General transfer comment: some lifting assist with mod cues for technique from EOB and from 3:1; pivot to 3:1 with RW, cues and assist for balance Ambulation/Gait Ambulation/Gait assistance: Min assist, +2 safety/equipment Gait Distance (Feet): 26 Feet Assistive device: Rolling walker (2 wheeled) Gait Pattern/deviations: Step-to pattern, Decreased stride length, Antalgic General Gait Details: limited tolerance to weight on R LE, cues and assist for walker management and positioning, son and daughter present and engaged to assist as well   ADL: ADL Overall ADL's : Needs assistance/impaired Eating/Feeding: With caregiver independent assisting, Sitting, Set up(Cues for placement) Eating/Feeding Details (indicate cue type and reason): Husband assisting to prep food due to vision deficits Grooming: Set up, Supervision/safety, Sitting Upper Body Bathing: Set up, Supervision/ safety, Sitting Lower Body Bathing: Minimal assistance, Sit to/from stand Upper Body Dressing : Set up, Supervision/safety Lower Body Dressing: Moderate assistance, Sit to/from stand Lower Body Dressing Details (indicate cue type and reason): Mod A for donning clothing over RLE and manage over hips. Min A for sit<>stand Toilet Transfer: Minimal assistance, 43, RW, Cueing for sequencing Toilet Transfer Details (indicate cue type and reason): Min A for power up into standing and then maintain balance during pivot. Cues for sequencign and RW management Functional mobility during ADLs: Minimal assistance, Rolling walker General ADL Comments: Pt with decreased functional performance due to pain at RLE and vision deficits. Pt motivated to return home and increased independence   Cognition: Cognition Overall Cognitive Status: Within Functional Limits for tasks assessed Orientation Level: Oriented  X4 Cognition Arousal/Alertness: Awake/alert Behavior During Therapy: WFL for tasks assessed/performed Overall Cognitive Status: Within Functional Limits for tasks assessed General Comments: Very  motivated   Physical Exam: Blood pressure 114/66, pulse (!) 103, temperature 97.6 F (36.4 C), temperature source Oral, resp. rate 17, height _0  (1.6 m), weight 50 kg, SpO2 96 %. Physical Exam  Neurological:  Patient is alert.  She is totally blind.  Follows commands.  Oriented to person place and time.  Skin:  Surgical site clean and dry.  Appropriately tender    General: No acute distress Mood and affect are appropriate Heart: Regular rate and rhythm no rubs murmurs or extra sounds Lungs: Clear to auscultation, breathing unlabored, no rales or wheezes Abdomen: Positive bowel sounds, soft nontender to palpation, nondistended Extremities: No clubbing, cyanosis, or edema Skin: No evidence of breakdown, no evidence of rash Neurologic: Cranial nerves II through XII intact, motor strength is 5/5 in bilateral deltoid, bicep, tricep, grip,left  hip flexor, knee extensors, ankle dorsiflexor and plantar flexor, 3- in RIgh tHF, 4/5 R KE, 5/5 in R ADF Sensory exam normal sensation to light touch  in bilateral upper and lower extremities  Musculoskeletal: Full range of motion upper  extremities. No joint swelling Right LE with limited HF, KE, normal ADF/PF  Lab Results Last 48 Hours        Results for orders placed or performed during the hospital encounter of 03/18/18 (from the past 48 hour(s))  TSH     Status: Abnormal    Collection Time: 03/21/18  4:10 PM  Result Value Ref Range    TSH 5.142 (H) 0.350 - 4.500 uIU/mL      Comment: Performed by a 3rd Generation assay with a functional sensitivity of <=0.01 uIU/mL. Performed at [facility], [place] [address] [place], [place] [address]    Theophylline level     Status: None    Collection Time: 03/21/18  4:10 PM  Result Value Ref Range     Theophylline Lvl 14.2 10.0 - 20.0 ug/mL      Comment: (NOTE)                                Detection Limit =  0.8                          <0.8 indicates None Detected Performed At: [facility] [employer], [place] [id number] Rush Farmer MD JO:[id number]    Vitamin B12     Status: None    Collection Time: 03/22/18  3:05 AM  Result Value Ref Range    Vitamin B-12 265 180 - 914 pg/mL      Comment: (NOTE) This assay is not validated for testing neonatal or myeloproliferative syndrome specimens for Vitamin B12 levels. Performed at [facility], [place] [address], [place], [place] [address]    Folate     Status: None    Collection Time: 03/22/18  3:05 AM  Result Value Ref Range    Folate 8.0 >5.9 ng/mL      Comment: Performed at [facility], [place] [address], [place], [place] [address]  Basic metabolic panel     Status: Abnormal    Collection Time: 03/22/18  8:13 AM  Result Value Ref Range    Sodium 134 (L) 135 - 145 mmol/L    Potassium 6.4 (HH) 3.5 - 5.1 mmol/L      Comment: SLIGHT HEMOLYSIS CRITICAL RESULT CALLED TO, READ BACK BY AND VERIFIED WITH: Doylene Canard AT [phone]  03/22/18 BY L BENFIELD      Chloride 105 98 - 111 mmol/L    CO2 17 (L) 22 - 32 mmol/L    Glucose, Bld 81 70 - 99 mg/dL    BUN 8 8 - 23 mg/dL    Creatinine, Ser 0.64 0.44 - 1.00 mg/dL    Calcium 8.6 (L) 8.9 - 10.3 mg/dL    GFR calc non Af Amer >60 >60 mL/min    GFR calc Af Amer >60 >60 mL/min      Comment: (NOTE) The eGFR has been calculated using the CKD EPI equation. This calculation has not been validated in all clinical situations. eGFR's persistently <60 mL/min signify possible Chronic Kidney Disease.      Anion gap 12 5 - 15      Comment: Performed at [facility] [address] [place], [place] [address]  Magnesium     Status: None    Collection Time: 03/22/18  8:13 AM  Result Value Ref Range    Magnesium 2.2 1.7 - 2.4 mg/dL      Comment: SLIGHT  HEMOLYSIS Performed at [facility] [address], [place], [place] [address]    T4, free     Status: None    Collection Time: 03/22/18 11:02 AM  Result Value Ref Range    Free T4 1.04 0.82 - 1.77 ng/dL      Comment: (NOTE) Biotin ingestion may interfere with free T4 tests. If the results are inconsistent with the TSH level, previous test results, or the clinical presentation, then consider biotin interference. If needed, order repeat testing after stopping biotin. Performed at [facility], [place] [address], [place], [place] [address]    T3, free     Status: Abnormal    Collection Time: 03/22/18 11:02 AM  Result Value Ref Range    T3, Free 0.9 (L) 2.0 - 4.4 pg/mL      Comment: (NOTE) Performed At: [facility] [place], [place] [id number] Rush Farmer MD PY:[id number]    Basic metabolic panel     Status: Abnormal    Collection Time: 03/22/18 11:02 AM  Result Value Ref Range    Sodium 136 135 - 145 mmol/L    Potassium 4.4 3.5 - 5.1 mmol/L      Comment: DELTA CHECK NOTED    Chloride 101 98 - 111 mmol/L    CO2 24 22 - 32 mmol/L    Glucose, Bld 98 70 - 99 mg/dL    BUN 5 (L) 8 - 23 mg/dL    Creatinine, Ser 0.70 0.44 - 1.00 mg/dL    Calcium 8.7 (L) 8.9 - 10.3 mg/dL    GFR calc non Af Amer >60 >60 mL/min    GFR calc Af Amer >60 >60 mL/min      Comment: (NOTE) The eGFR has been calculated using the CKD EPI equation. This calculation has not been validated in all clinical situations. eGFR's persistently <60 mL/min signify possible Chronic Kidney Disease.      Anion gap 11 5 - 15      Comment: Performed at [facility] [address], [place], [place] [address]  Basic metabolic panel     Status: Abnormal    Collection Time: 03/23/18  2:54 AM  Result Value Ref Range    Sodium 135 135 - 145 mmol/L    Potassium 5.4 (H) 3.5 - 5.1 mmol/L      Comment: SPECIMEN HEMOLYZED. HEMOLYSIS  MAY AFFECT INTEGRITY OF RESULTS.    Chloride 105 98 -  111 mmol/L    CO2 24 22 - 32 mmol/L    Glucose, Bld 93 70 - 99 mg/dL    BUN 14 8 - 23 mg/dL    Creatinine, Ser 0.95 0.44 - 1.00 mg/dL    Calcium 8.8 (L) 8.9 - 10.3 mg/dL    GFR calc non Af Amer 58 (L) >60 mL/min    GFR calc Af Amer >60 >60 mL/min      Comment: (NOTE) The eGFR has been calculated using the CKD EPI equation. This calculation has not been validated in all clinical situations. eGFR's persistently <60 mL/min signify possible Chronic Kidney Disease.      Anion gap 6 5 - 15      Comment: Performed at [facility] [address] [place], [place] [address]  CBC with Differential/Platelet     Status: Abnormal    Collection Time: 03/23/18  2:54 AM  Result Value Ref Range    WBC 8.4 4.0 - 10.5 K/uL    RBC 3.41 (L) 3.87 - 5.11 MIL/uL    Hemoglobin 12.0 12.0 - 15.0 g/dL    HCT 38.6 36.0 - 46.0 %    MCV 113.2 (H) 80.0 - 100.0 fL    MCH 35.2 (H) 26.0 - 34.0 pg    MCHC 31.1 30.0 - 36.0 g/dL    RDW 16.7 (H) 11.5 - 15.5 %    Platelets 219 150 - 400 K/uL    nRBC 0.0 0.0 - 0.2 %    Neutrophils Relative % 64 %    Neutro Abs 5.4 1.7 - 7.7 K/uL    Lymphocytes Relative 22 %    Lymphs Abs 1.8 0.7 - 4.0 K/uL    Monocytes Relative 12 %    Monocytes Absolute 1.0 0.1 - 1.0 K/uL    Eosinophils Relative 1 %    Eosinophils Absolute 0.1 0.0 - 0.5 K/uL    Basophils Relative 1 %    Basophils Absolute 0.1 0.0 - 0.1 K/uL    nRBC 0 0 /100 WBC    Polychromasia PRESENT        Comment: Performed at [facility], [address], [place], [place] [address]       Imaging Results (Last 48 hours)  Pelvis Portable   Result Date: 03/21/2018 CLINICAL DATA:  72 year old female with hip fracture. Subsequent encounter. EXAM: PORTABLE PELVIS 1-2 VIEWS COMPARISON:  03/21/2018 intraoperative films. FINDINGS: Three pins traverse right subcapital fracture. Pins appear in appropriate position on this frontal projection. IMPRESSION: Post pinning right femoral neck fracture. Electronically Signed    By: Genia Del M.D.   On: 03/21/2018 16:27             Medical Problem List and Plan: 1.  Decreased functional mobility secondary to right neck fracture.  Status post percutaneous 03/21/2018 ORIF CIR evals today    2.  DVT Prophylaxis/Anticoagulation: Chronic Eliquis 3. Pain Management: Neurontin 500 mg nightly, oxycodone and Ultram as needed 4. Mood: Provide emotional support.Ativan 57m QHS 5. Neuropsych: This patient is capable of making decisions on her own behalf. 6. Skin/Wound Care: Routine skin checks 7. Fluids/Electrolytes/Nutrition: Routine and outs with follow-up chemistries 8.  Atrial fibrillation.  Cardiac rate controlled.  Follow cardiology services as needed.  Amiodarone as directed, Lopressor 50 mg twice daily 9.  Hypothyroidism.  Synthroid 10.  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  Continue chronic prednisone 11.  Asthma.  Continue inhalers as directed 12.  Constipation.  Laxative assistance Post Admission Physician Evaluation: 1. Functional deficits secondary  to RIght femoral neck fracture. 2. Patient admitted to receive collaborative, interdisciplinary care between the physiatrist, rehab nursing staff, and therapy team. 3. Patient's level of medical complexity and substantial therapy needs in context of that medical necessity cannot be provided at a lesser intensity of care. 4. Patient has experienced substantial functional loss from his/her baseline.  Judging by the patient's diagnosis, physical exam, and functional history, the patient has potential for functional progress which will result in measurable gains while on inpatient rehab.  These gains will be of substantial and practical use upon discharge in facilitating mobility and self-care at the household level. 5. Physiatrist will provide 24 hour management of medical needs as well as oversight of the therapy plan/treatment and provide guidance as appropriate regarding the interaction of the two. 6. 24 hour rehab  nursing will assist in the management of  bladder management, bowel management, safety, skin/wound care, disease management, medication administration, pain management and patient education  and help integrate therapy concepts, techniques,education, etc. 7. PT will assess and treat for:pre gait, gait training, endurance , safety, equipment, neuromuscular re education  .  Goals are: independent with assistive device. 8. OT will assess and treat for ADLs, Cognitive perceptual skills, Neuromuscular re education, safety, endurance, equipment  .  Goals are: independent with increased time.  9. SLP will assess and treat for  .  Goals are: N/A. 10. Case Management and Social Worker will assess and treat for psychological issues and discharge planning. 11. Team conference will be held weekly to assess progress toward goals and to determine barriers to discharge. 12.  Patient will receive at least 3 hours of therapy per day at least 5 days per week. 13. ELOS and Prognosis: 7-10d excellent  "I have personally performed a face to face diagnostic evaluation of this patient.  Additionally, I have reviewed and concur with the physician assistant's documentation above."  Charlett Blake M.D. [facility] FAAPM&R (Sports Med, Neuromuscular Med) Diplomate Am Board of Electrodiagnostic Med  Cathlyn Parsons, PA-C 03/23/2018

## 2018-03-25 NOTE — Progress Notes (Signed)
Physical Therapy Session Note  Patient Details  Name: Crystal [name] MRN: [id number] Date of Birth: Dec 28, 1945  Today's Date: 03/25/2018 PT Individual Time: 1300-1400 PT Individual Time Calculation (min): 60 min   Short Term Goals: Week 1:  PT Short Term Goal 1 (Week 1): STGs = LTGs  Skilled Therapeutic Interventions/Progress Updates:  Pt was seen bedside in the pm. Pt transferred supine to edge of bed with S and verbal cues. Pt transferred sit to stand with c/g and rolling walker. Pt transferred to w/c with rolling walker and c/g with verbal cues. Pt ascended/descended 1 curb with rolling walker and min with verbal cues. Pt ambulated 150 feet with rolling walker and min A with verbal cues. Pt performed step taps and alternating steptaps 3 sets x 10 reps each for LE strengthening. Pt returned to room and left sitting up in w/c with family at bedside.   Therapy Documentation Precautions:  Precautions Precautions: Fall Precaution Comments: Blind Restrictions Weight Bearing Restrictions: Yes RLE Weight Bearing: Weight bearing as tolerated General: Chart Reviewed: Yes Family/Caregiver Present: Yes Pain: Pt c/o 7/10 pain R LE.    Other Treatments:      Therapy/Group: Individual Therapy  Dub Amis 03/25/2018, 2:04 PM

## 2018-03-25 NOTE — Evaluation (Signed)
Physical Therapy Assessment and Plan  Patient Details  Name: TESNEEM DUFRANE MRN: [id number] Date of Birth: May 23, 1946  PT Diagnosis: Difficulty walking, Muscle weakness and Pain in joint Rehab Potential: Good ELOS:     Today's Date: 03/25/2018 PT Individual Time: 9381-0175 PT Individual Time Calculation (min): 68 min    Problem List:  Patient Active Problem List   Diagnosis Date Noted  . Femoral neck fracture ([place]) 03/24/2018  . Fracture of femoral neck, right ([place]) 03/22/2018  . Atrial fibrillation with RVR ([place]) 03/21/2018  . Atrial fibrillation ([place]) 03/18/2018  . Femoral artery thrombosis, right ([place]) 09/01/2017  . Paroxysmal atrial fibrillation ([facility])   . Herpes zoster without complication 04/07/8526  . Jaw claudication 07/27/2017  . Temporal arteritis ([place]) 07/18/2017  . Vision, loss, sudden, bilateral 07/18/2017  . 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 Parotitis 10/06/2016  . Osteoarthritis 06/25/2016  . Anxiety 06/25/2016  . Back pain 07/18/2015  . 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 Osteopenia 06/03/2008  . History of colonic polyps 06/03/2008  . INTENTION TREMOR 12/06/2007  . IBS 10/16/2007  . Hyperlipidemia 06/06/2007  . Essential hypertension 06/06/2007  . Chronic obstructive airway disease with asthma ([place]) 02/23/2007    Past Medical History:  Past Medical History:  Diagnosis Date  . Adenomatous colon polyp 2007 & 2010    Dr [name]  . Allergy    seasonal  . Arthritis   . Cataract   . Diverticulosis   . 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 Hyperlipidemia   . Hypertension   . IBS (irritable bowel syndrome)   . Legally blind   . MVP (mitral valve prolapse)   . Paroxysmal atrial fibrillation with rapid ventricular response ([place]) 08/29/2017  . Peripheral vascular disease ([place])   . RAD (reactive airway disease)    Past Surgical History:  Past Surgical History:  Procedure Laterality Date  . ABDOMINAL HYSTERECTOMY      with USO for dysfunctionall menses  . APPENDECTOMY    . ARTERY BIOPSY Bilateral 07/21/2017   Procedure: BIOPSY BILATERAL TEMPORAL ARTERY;  Surgeon: [name], MD;  Location: [facility];  Service: Vascular;  Laterality: Bilateral;  . CATARACT EXTRACTION W/ INTRAOCULAR LENS IMPLANT  2013   bilateral; Dr [name]  . COLONOSCOPY  2013   negative , due 2018; Dr [name]  . COLONOSCOPY W/ POLYPECTOMY      X2 ; diverticulosis  . EMBOLECTOMY Right 08/29/2017   Procedure: Right Groin Exploration  ;  Surgeon: Rosetta Posner, MD;  Location: [facility];  Service: Vascular;  Laterality: Right;  . HIP PINNING,CANNULATED Right 03/21/2018  . HIP PINNING,CANNULATED Right 03/21/2018   Procedure: CANNULATED HIP PINNING;  Surgeon: Rod Can, MD;  Location: [facility];  Service: Orthopedics;  Laterality: Right;  . LUMBAR LAMINECTOMY  2000   Dr Vertell Limber  . SHOULDER SURGERY     R shoulder  . TONSILLECTOMY      Assessment & Plan Clinical Impression: Patient is a 72 year old right-handed female with history of atrial fibrillation maintained on Eliquis, temporal arteritis with total blindness maintained on chronic prednisone, hypertension.  Per chart review patient lives with spouse.  Multilevel home with bedroom on main level.  2 steps to entry.  Reported to be using a cane prior to admission and needing assistance due to blindness.  They have a son in the area that works.  Husband can assist but also uses a cane.  Presented 03/18/2018 after mechanical fall.  No loss of  consciousness.  Cranial CT scan as well as CT cervical spine negative.  X-rays imaging revealed right femoral neck fracture.  Preoperative clearance by cardiology services initially transitioned to IV heparin with Eliquis held with plan for surgical intervention and underwent percutaneous screw fixation 03/21/2018 per Dr. Lyla Glassing.  Hospital course pain management.  Weightbearing as tolerated right lower extremity.  Eliquis has been resumed for atrial  fibrillation as well as maintained on amiodarone.   Patient transferred to CIR on 03/24/2018 .   Patient currently requires min with mobility secondary to muscle weakness and decreased cardiorespiratoy endurance.  Prior to hospitalization, patient was modified independent  with mobility and lived with Spouse in a House(3 story home) home.  Home access is 3 steps in garage with left rail, 3 smaller steps at front door without railStairs to enter.  Patient will benefit from skilled PT intervention to maximize safe functional mobility, minimize fall risk and decrease caregiver burden for planned discharge home with 24 hour assist.  Anticipate patient will benefit from follow up [facility] at discharge.  PT - End of Session Activity Tolerance: Tolerates 30+ min activity with multiple rests Endurance Deficit: Yes PT Assessment Rehab Potential (ACUTE/IP ONLY): Good PT Barriers to Discharge: Inaccessible home environment PT Patient demonstrates impairments in the following area(s): Balance;Endurance;Safety;Motor PT Transfers Functional Problem(s): Bed Mobility;Bed to Chair;Car PT Locomotion Functional Problem(s): Ambulation;Stairs PT Plan PT Intensity: Minimum of 1-2 x/day ,45 to 90 minutes PT Frequency: 5 out of 7 days PT Treatment/Interventions: Ambulation/gait training;Balance/vestibular training;Discharge planning;DME/adaptive equipment instruction;Functional mobility training;Neuromuscular re-education;Patient/family education;Stair training;Therapeutic Exercise;Therapeutic Activities;UE/LE Strength taining/ROM;UE/LE Coordination activities;Wheelchair propulsion/positioning;Visual/perceptual remediation/compensation PT Transfers Anticipated Outcome(s): S transfers PT Locomotion Anticipated Outcome(s): S gait, c/g stairs PT Recommendation Follow Up Recommendations: Home health PT Patient destination: Home Equipment Details: to be determined  Skilled Therapeutic Intervention PT evaluation completed  and treatment plan initiated. Pt performed multiple sit to stand and stand pivot transfers with rolling walker and min A with verbal cues for safety. Following evaluation and treatment returned to room and left sitting up in w/c with daughter at bedside and call bell within reach.   PT Evaluation Precautions/Restrictions Precautions Precautions: Fall Precaution Comments: Blind Restrictions Weight Bearing Restrictions: Yes RLE Weight Bearing: Weight bearing as tolerated General Chart Reviewed: Yes Family/Caregiver Present: Yes  Pain Pt c/o 7/10 pain R hip medicated prior to treatment.  Home Living/Prior Functioning Home Living Available Help at Discharge: Family;Available 24 hours/day Type of Home: House(3 story home) Home Access: Stairs to enter [employer] of Steps: 3 steps in garage with left rail, 3 smaller steps at front door without rail Entrance Stairs-Rails: Left Home Layout: Multi-level;Able to live on main level with bedroom/bathroom Additional Comments: home also has a sunken den with 1 step no rails  Lives With: Spouse Prior Function Level of Independence: Independent with gait;Independent with transfers;Requires assistive device for independence  Able to Take Stairs?: Yes Vision/Perception Total Blindness  Perception Perception: Within Functional Limits  Cognition Overall Cognitive Status: Within Functional Limits for tasks assessed Orientation Level: Oriented X4 Memory: Appears intact Awareness: Appears intact Safety/Judgment: Appears intact Sensation Sensation Light Touch: Appears Intact Motor  Motor Motor: Within Functional Limits  Mobility Bed Mobility Bed Mobility: Supine to Sit;Sit to Supine Supine to Sit: Supervision/Verbal cueing Sit to Supine: Supervision/Verbal cueing Transfers Transfers: Sit to Stand;Stand to Sit;Stand Pivot Transfers Sit to Stand: Minimal Assistance - Patient > 75% Stand to Sit: Minimal Assistance - Patient >  75% Stand Pivot Transfers: Minimal Assistance - Patient > 75% Transfer (  Assistive device): Rolling walker Locomotion  Gait Ambulation: Yes Gait Assistance: Minimal Assistance - Patient > 75% Gait Distance (Feet): 50 Feet Assistive device: Rolling walker Gait Assistance Details: Verbal cues for precautions/safety Stairs / Additional Locomotion Stairs: No Wheelchair Mobility Wheelchair Mobility: No  Trunk/Postural Assessment  Cervical Assessment Cervical Assessment: Within Functional Limits Thoracic Assessment Thoracic Assessment: Within Functional Limits Lumbar Assessment Lumbar Assessment: Within Functional Limits Postural Control Postural Control: Deficits on evaluation  Balance Balance Balance Assessed: Yes Static Sitting Balance Static Sitting - Balance Support: Feet supported Static Sitting - Level of Assistance: 5: Stand by assistance Static Standing Balance Static Standing - Balance Support: During functional activity Static Standing - Level of Assistance: 4: Min assist Dynamic Standing Balance Dynamic Standing - Balance Support: During functional activity Dynamic Standing - Level of Assistance: 4: Min assist Extremity Assessment B UEs as per OT evaluation.   RLE Assessment RLE Assessment: Exceptions to [facility] Passive Range of Motion (PROM) Comments: grossly WFLs General Strength Comments: grossly 3-/5 to 3/5 LLE Assessment LLE Assessment: Within Functional Limits    Refer to Care Plan for Long Term Goals  Recommendations for other services: None   Discharge Criteria: Patient will be discharged from PT if patient refuses treatment 3 consecutive times without medical reason, if treatment goals not met, if there is a change in medical status, if patient makes no progress towards goals or if patient is discharged from hospital.  The above assessment, treatment plan, treatment alternatives and goals were discussed and mutually agreed upon: by patient and by  family  Dub Amis 03/25/2018, 12:40 PM

## 2018-03-25 NOTE — Evaluation (Signed)
Occupational Therapy Assessment and Plan  Patient Details  Name: Crystal [name] MRN: [id number] Date of Birth: 1946/01/20  OT Diagnosis: acute pain, blindness and low vision and muscle weakness (generalized) Rehab Potential: Rehab Potential (ACUTE ONLY): Good ELOS: 7-9 days   Today's Date: 03/25/2018 OT Individual Time: 1045-1200 OT Individual Time Calculation (min): 75 min     Problem List:  Patient Active Problem List   Diagnosis Date Noted  . Femoral neck fracture ([place]) 03/24/2018  . Fracture of femoral neck, right ([place]) 03/22/2018  . Atrial fibrillation with RVR ([place]) 03/21/2018  . Atrial fibrillation ([place]) 03/18/2018  . 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 Paroxysmal atrial fibrillation ([facility])   . Herpes zoster without complication 81/15/7262  . Jaw claudication 07/27/2017  . Temporal arteritis ([place]) 07/18/2017  . Vision, loss, sudden, bilateral 07/18/2017  . 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 Parotitis 10/06/2016  . Osteoarthritis 06/25/2016  . Anxiety 06/25/2016  . Back pain 07/18/2015  . 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 Osteopenia 06/03/2008  . History of colonic polyps 06/03/2008  . INTENTION TREMOR 12/06/2007  . IBS 10/16/2007  . Hyperlipidemia 06/06/2007  . Essential hypertension 06/06/2007  . Chronic obstructive airway disease with asthma ([place]) 02/23/2007    Past Medical History:  Past Medical History:  Diagnosis Date  . Adenomatous colon polyp 2007 & 2010    Dr [name]  . Allergy    seasonal  . Arthritis   . Cataract   . Diverticulosis   . 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 Hyperlipidemia   . Hypertension   . IBS (irritable bowel syndrome)   . Legally blind   . MVP (mitral valve prolapse)   . Paroxysmal atrial fibrillation with rapid ventricular response ([place]) 08/29/2017  . Peripheral vascular disease ([place])   . RAD (reactive airway disease)    Past Surgical History:  Past Surgical  History:  Procedure Laterality Date  . ABDOMINAL HYSTERECTOMY     with USO for dysfunctionall menses  . APPENDECTOMY    . ARTERY BIOPSY Bilateral 07/21/2017   Procedure: BIOPSY BILATERAL TEMPORAL ARTERY;  Surgeon: [name], MD;  Location: [facility];  Service: Vascular;  Laterality: Bilateral;  . CATARACT EXTRACTION W/ INTRAOCULAR LENS IMPLANT  2013   bilateral; Dr [name]  . COLONOSCOPY  2013   negative , due 2018; Dr [name]  . COLONOSCOPY W/ POLYPECTOMY      X2 ; diverticulosis  . EMBOLECTOMY Right 08/29/2017   Procedure: Right Groin Exploration  ;  Surgeon: Rosetta Posner, MD;  Location: [facility];  Service: Vascular;  Laterality: Right;  . HIP PINNING,CANNULATED Right 03/21/2018  . HIP PINNING,CANNULATED Right 03/21/2018   Procedure: CANNULATED HIP PINNING;  Surgeon: Rod Can, MD;  Location: [facility];  Service: Orthopedics;  Laterality: Right;  . LUMBAR LAMINECTOMY  2000   Dr Vertell Limber  . SHOULDER SURGERY     R shoulder  . TONSILLECTOMY      Assessment & Plan Clinical Impression: Crystal [name] is a 72 y.o. right-handed female with history of atrial fibrillation maintained on Eliquis, temporal arteritis/legally blind and maintained on chronic prednisone, hypertension.  Per chart review patient lives with spouse.  Multilevel home with bedroom on Main.  2 steps to entry.  Reported to be using a cane prior to admission and needing assistance due to blindness.  They have a son in the area that works.  Husband can assist but also uses a cane.  Presented  03/18/2018 after mechanical fall.  No loss of consciousness.  Cranial CT scan as well as CT cervical spine negative.  X-rays and imaging revealed right femoral neck fracture.  Preoperative clearance by cardiology services and changed to IV heparin with Eliquis held with plan for surgical intervention.  Underwent percutaneous screw fixation 03/21/2018 per Dr. Lyla Glassing.  Hospital course pain management.  Weightbearing as tolerated right lower  extremity.  Eliquis is since been resumed as well as maintained on amiodarone.  Therapy evaluations completed with recommendations of physical medicine rehab consult  Patient transferred to CIR on 03/24/2018 .    Patient currently requires min with basic self-care skills secondary to muscle weakness and muscle joint tightness, blindness and decreased standing balance and decreased balance strategies.  Prior to hospitalization, patient could complete BADLs with modified independent .  Patient will benefit from skilled intervention to increase independence with basic self-care skills prior to discharge home with care partner.  Anticipate patient will require 24 hour supervision and follow up home health.  OT - End of Session Endurance Deficit: Yes OT Assessment Rehab Potential (ACUTE ONLY): Good OT Patient demonstrates impairments in the following area(s): Balance;Endurance;Pain;Vision OT Basic ADL's Functional Problem(s): Bathing;Dressing;Toileting OT Transfers Functional Problem(s): Toilet;Tub/Shower OT Additional Impairment(s): None OT Plan OT Intensity: Minimum of 1-2 x/day, 45 to 90 minutes OT Frequency: 5 out of 7 days OT Duration/Estimated Length of Stay: 7-9 days OT Treatment/Interventions: Balance/vestibular training;Discharge planning;DME/adaptive equipment instruction;Functional mobility training;Pain management;Psychosocial support;Patient/family education;Self Care/advanced ADL retraining;Therapeutic Activities;Therapeutic Exercise;UE/LE Strength taining/ROM OT Self Feeding Anticipated Outcome(s): no goal OT Basic Self-Care Anticipated Outcome(s): supervision OT Toileting Anticipated Outcome(s): supervision OT Bathroom Transfers Anticipated Outcome(s): supervision to toilet, CGA to walk in shower OT Recommendation Patient destination: Home Follow Up Recommendations: Home health OT Equipment Recommended: Tub/shower seat   Skilled Therapeutic Intervention Pt seen for initial  evaluation and ADL training with a focus on balance and guiding strategies.  Explained role of OT in inpt rehab. Pt had just been working with OT at home to adapt to her recent complete loss of vision. Pt progressed at home to being mod I with her BADLs, assisting with chopping vegetables for meal prep and going out to lunch with friends.  She now needs min A due R hip joint pain and stiffness from recent fx.  For standing activities with toileting and bathing, pt needs CGA to stabilize balance.  To wash foot, she used a long sponge.  She needed A to don clothing and sock on R leg.  Her transfers/ambulation/ navigation are mostly challenging due to her unfamiliar environment as she is blind.  Pt needs min A for balance but mod cuing to navigate through doorways and to turn to sit on the toilet and shower bench.  Due to this environment, pt's goals will be S but she will likely progress back to mod I at home fairly quickly with [place].   Pt completed therapy and then sat back to edge of bed (bed alarm set) to eat lunch.  Set up tray and oriented pt to location of items.  Husband also present this session.    OT Evaluation Precautions/Restrictions  Precautions Precautions: Fall Precaution Comments: Blind Restrictions Weight Bearing Restrictions: Yes RLE Weight Bearing: Weight bearing as tolerated Vital Signs Therapy Vitals Pulse Rate: (!) 53 Resp: 18 BP: 115/74 Patient Position (if appropriate): Sitting Oxygen Therapy SpO2: 95 % O2 Device: Room Air Pain  2/10 hip pain at rest, increased to 4-5/10 with activity, improved with rest Home Living/Prior  Functioning Home Living Family/patient expects to be discharged to:: Private residence Living Arrangements: Spouse/significant other Available Help at Discharge: Family, Available 24 hours/day Type of Home: House(3 story home) Home Access: Stairs to enter [employer] of Steps: 3 steps in garage with left rail, 3 smaller steps at front  door without rail Entrance Stairs-Rails: Left Home Layout: Multi-level, Able to live on main level with bedroom/bathroom Bathroom Shower/Tub: Walk-in shower Additional Comments: home also has a sunken den with 1 step no rails  Lives With: Spouse Prior Function Level of Independence: Independent with gait, Independent with transfers, Requires assistive device for independence, Independent with basic ADLs  Able to Take Stairs?: Yes Comments: Pt was performing ADLs independently. Husband assisting with IADLs. Recently started OT to adapt and modify home environment for vision loss. ADL ADL Eating: Set up, Supervision/safety Where Assessed-Eating: Edge of bed Grooming: Supervision/safety Where Assessed-Grooming: Sitting at sink Upper Body Bathing: Setup, Supervision/safety Where Assessed-Upper Body Bathing: Shower Lower Body Bathing: Contact guard Where Assessed-Lower Body Bathing: Shower Upper Body Dressing: Setup Where Assessed-Upper Body Dressing: Chair Lower Body Dressing: Minimal assistance Where Assessed-Lower Body Dressing: Chair Toileting: Contact guard Where Assessed-Toileting: Glass blower/designer: Moderate verbal cueing, Minimal assistance Toilet Transfer Method: Counselling psychologist: Raised toilet seat Social research officer, government: Moderate cueing, Minimal assistance Social research officer, government Method: Magazine features editor Baseline Vision/History: (blind) Additional Comments: Pt is blind Perception  Perception: Within Functional Limits Praxis Praxis: Intact Cognition Overall Cognitive Status: Within Functional Limits for tasks assessed Arousal/Alertness: Awake/alert Orientation Level: Person;Place;Situation Person: Oriented Place: Oriented Situation: Oriented Year: 2019 Month: October Day of Week: Correct Memory: Appears intact Immediate Memory Recall: Sock;Blue;Bed Memory Recall: Sock;Blue;Bed Memory  Recall Sock: Without Cue Memory Recall Blue: Without Cue Memory Recall Bed: Without Cue Awareness: Appears intact Safety/Judgment: Appears intact Sensation Sensation Light Touch: Appears Intact Hot/Cold: Appears Intact Proprioception: Appears Intact Stereognosis: Appears Intact Coordination Gross Motor Movements are Fluid and Coordinated: Yes Fine Motor Movements are Fluid and Coordinated: Yes Motor  Motor Motor: Within Functional Limits Mobility  Bed Mobility Bed Mobility: Supine to Sit;Sit to Supine Supine to Sit: Supervision/Verbal cueing Sit to Supine: Supervision/Verbal cueing Transfers Sit to Stand: Minimal Assistance - Patient > 75% Stand to Sit: Minimal Assistance - Patient > 75%  Trunk/Postural Assessment  Cervical Assessment Cervical Assessment: Within Functional Limits Thoracic Assessment Thoracic Assessment: Within Functional Limits Lumbar Assessment Lumbar Assessment: Within Functional Limits Postural Control Postural Control: Deficits on evaluation  Balance Balance Balance Assessed: Yes Static Sitting Balance Static Sitting - Balance Support: Feet supported Static Sitting - Level of Assistance: 5: Stand by assistance Static Standing Balance Static Standing - Balance Support: During functional activity Static Standing - Level of Assistance: 4: Min assist Dynamic Standing Balance Dynamic Standing - Balance Support: During functional activity Dynamic Standing - Level of Assistance: 4: Min assist Extremity/Trunk Assessment RUE Assessment RUE Assessment: Within Functional Limits LUE Assessment LUE Assessment: Within Functional Limits     Refer to Care Plan for Long Term Goals  Recommendations for other services: None    Discharge Criteria: Patient will be discharged from OT if patient refuses treatment 3 consecutive times without medical reason, if treatment goals not met, if there is a change in medical status, if patient makes no progress towards  goals or if patient is discharged from hospital.  The above assessment, treatment plan, treatment alternatives and goals were discussed and mutually agreed upon: by patient and by family  [place] 03/25/2018, 3:55 PM

## 2018-03-26 ENCOUNTER — Inpatient Hospital Stay (HOSPITAL_COMMUNITY): Payer: Medicare Other | Admitting: Physical Therapy

## 2018-03-26 DIAGNOSIS — Z8669 Personal history of other diseases of the nervous system and sense organs: Secondary | ICD-10-CM

## 2018-03-26 DIAGNOSIS — R2689 Other abnormalities of gait and mobility: Secondary | ICD-10-CM

## 2018-03-26 DIAGNOSIS — S72001D Fracture of unspecified part of neck of right femur, subsequent encounter for closed fracture with routine healing: Principal | ICD-10-CM

## 2018-03-26 NOTE — Progress Notes (Signed)
HR-53, rechecked apically=58, lopressor due. Paged Dr. Letta Pate, orders to decrease lopressor to 25mg 's twice daily. PRN Oxy IR 10mg 's given at 2015, for C/O right hip pain. Patient also, due ativan and neurontin, "I've been taking those for years." Requested all meds given at one time. RLE edema and bruising noted. Refused Scheduled SennaS. Patrici Ranks A

## 2018-03-26 NOTE — Progress Notes (Addendum)
PHYSICAL MEDICINE & REHABILITATION PROGRESS NOTE   Subjective/Complaints: Right hip pain has been well controlled, had a bowel movement this morning.  No other complaints   Review of systems denies chest pain shortness of breath nausea vomiting diarrhea constipation Objective:   No results found. Recent Labs    03/24/18 0353  WBC 7.7  HGB 11.4*  HCT 35.7*  PLT 250   Recent Labs    03/24/18 0353  NA 133*  K 5.2*  CL 99  CO2 23  GLUCOSE 79  BUN 24*  CREATININE 0.99  CALCIUM 8.8*    Intake/Output Summary (Last 24 hours) at 03/26/2018 1044 Last data filed at 03/26/2018 0800 Gross per 24 hour  Intake 804 ml  Output -  Net 804 ml     Physical Exam: Vital Signs Blood pressure 124/76, pulse 61, temperature 98 F (36.7 C), temperature source Oral, resp. rate 18, height 5\' 3"  (1.6 m), weight 51.9 kg, SpO2 97 %.   General: No acute distress Mood and affect are appropriate Heart: Regular rate and rhythm no rubs murmurs or extra sounds Lungs: Clear to auscultation, breathing unlabored, no rales or wheezes Abdomen: Positive bowel sounds, soft nontender to palpation, nondistended Extremities: No clubbing, cyanosis, or edema Skin: No evidence of breakdown, no evidence of rash Neurologic: Cranial nerves II through XII intact, motor strength is 5/5 in bilateral deltoid, bicep, tricep, grip, LEFT hip flexor, knee extensors, ankle dorsiflexor and plantar flexor 2- R HF, 3- R KE  4/5 R ADF Sensory exam normal sensation to light touch in bilateral upper and lower extremities  Musculoskeletal: Full range of motion in all 4 extremities. No joint swelling   Assessment/Plan: 1. Functional deficits secondary to R FNF which require 3+ hours per day of interdisciplinary therapy in a comprehensive inpatient rehab setting.  Physiatrist is providing close team supervision and 24 hour management of active medical problems listed below.  Physiatrist and rehab team continue  to assess barriers to discharge/monitor patient progress toward functional and medical goals Medical Problem List and Plan: 1.  Decreased functional mobility secondary to right neck fracture.  Status post percutaneous 03/21/2018 [facility] CIR PT, OT    2.  DVT Prophylaxis/Anticoagulation: Chronic Eliquis 3. Pain Management: Neurontin 500 mg nightly, oxycodone and Ultram as needed 4. Mood: Provide emotional support.Ativan 1mg  QHS 5. Neuropsych: This patient is capable of making decisions on her own behalf. 6. Skin/Wound Care: Routine skin checks 7. Fluids/Electrolytes/Nutrition: Routine and outs with follow-up chemistries 8.  Atrial fibrillation.  Cardiac rate controlled at 61 10/13.  Follow cardiology services as needed.  Amiodarone as directed, Lopressor 50 mg twice daily Vitals:   03/26/18 0825 03/26/18 0826  BP:    Pulse:  61  Resp:  18  Temp:    SpO2: 97% 97%   9.  Hypothyroidism.  Synthroid 10.  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  Continue chronic prednisone 11.  Asthma.  Continue inhalers as directed 12.  Constipation.  Laxative assistance, reports BM this am  Care Tool:  Bathing    Body parts bathed by patient: Right arm, Left arm, Chest, Abdomen, Front perineal area, Buttocks, Right upper leg, Left upper leg, Right lower leg, Left lower leg, Face(used long sponge for R foot)         Bathing assist Assist Level: Contact Guard/Touching assist(CGA when standing)     Upper Body Dressing/Undressing Upper body dressing   What is the patient wearing?: Pull over shirt    Upper body assist  Assist Level: Set up assist    Lower Body Dressing/Undressing Lower body dressing      What is the patient wearing?: Underwear/pull up     Lower body assist Assist for lower body dressing: Minimal Assistance - Patient > 75%     Toileting Toileting    Toileting assist Assist for toileting: Contact Guard/Touching assist     Transfers Chair/bed transfer  Transfers assist      Chair/bed transfer assist level: Contact Guard/Touching assist     Locomotion Ambulation   Ambulation assist      Assist level: Contact Guard/Touching assist Assistive device: Walker-rolling Max distance: 275   Walk 10 feet activity   Assist     Assist level: Contact Guard/Touching assist Assistive device: Walker-rolling   Walk 50 feet activity   Assist    Assist level: Contact Guard/Touching assist Assistive device: Walker-rolling    Walk 150 feet activity   Assist Walk 150 feet activity did not occur: Safety/medical concerns  Assist level: Contact Guard/Touching assist Assistive device: Walker-rolling    Walk 10 feet on uneven surface  activity   Assist Walk 10 feet on uneven surfaces activity did not occur: Safety/medical concerns         Wheelchair     Assist Will patient use wheelchair at discharge?: No   Wheelchair activity did not occur: Safety/medical concerns         Wheelchair 50 feet with 2 turns activity    Assist    Wheelchair 50 feet with 2 turns activity did not occur: Safety/medical concerns       Wheelchair 150 feet activity     Assist Wheelchair 150 feet activity did not occur: Safety/medical concerns            LOS: 2 days A FACE TO FACE EVALUATION WAS PERFORMED  Charlett Blake 03/26/2018, 10:44 AM

## 2018-03-26 NOTE — Plan of Care (Signed)
  Problem: Consults Goal: RH GENERAL PATIENT EDUCATION Description See Patient Education module for education specifics. Outcome: Progressing Goal: Skin Care Protocol Initiated - if Braden Score 18 or less Description If consults are not indicated, leave blank or document N/A Outcome: Progressing   Problem: RH BOWEL ELIMINATION Goal: RH STG MANAGE BOWEL WITH ASSISTANCE Description STG Manage Bowel with mod I Assistance.  Outcome: Progressing   Problem: RH SKIN INTEGRITY Goal: RH STG SKIN FREE OF INFECTION/BREAKDOWN Description Patients skin will remain free from further infection or breakdown with mod I assist.  Outcome: Progressing Goal: RH STG MAINTAIN SKIN INTEGRITY WITH ASSISTANCE Description STG Maintain Skin Integrity With mod I Assistance.  Outcome: Progressing   Problem: RH SAFETY Goal: RH STG ADHERE TO SAFETY PRECAUTIONS W/ASSISTANCE/DEVICE Description STG Adhere to Safety Precautions With min Assistance/Device.  Outcome: Progressing   Problem: RH PAIN MANAGEMENT Goal: RH STG PAIN MANAGED AT OR BELOW PT'S PAIN GOAL Description < 4  Outcome: Progressing

## 2018-03-26 NOTE — Progress Notes (Signed)
Physical Therapy Session Note  Patient Details  Name: MAYLI COVINGTON MRN: [id number] Date of Birth: [birth year]  Today's Date: 03/26/2018 PT Individual Time: 0900-0958 PT Individual Time Calculation (min): 58 min   Short Term Goals: Week 1:  PT Short Term Goal 1 (Week 1): STGs = LTGs  Skilled Therapeutic Interventions/Progress Updates:  Pt was seen bedside in the am. Pt transported to rehab gym. Pt performed all sit to stand transfers with rolling walker and S. Pt performed all stand pivot transfers with rolling walker and c/g with verbal cues. Pt ambulated 75 feet x 2 and 75 feet x 2 with turns through gym doors to mimic layout of house with rolling walker and c/g with verbal cues. Pt ascended/descended 4 stairs with B rails x 2 with min A and verbal cues. Pt performed 3 sets x 10 reps each mini squats. Pt returned to room following treatment and left sitting up in w/c with husband at bedside and all needs within reach.   Therapy Documentation Precautions:  Precautions Precautions: Fall Precaution Comments: Blind Restrictions Weight Bearing Restrictions: Yes RLE Weight Bearing: Weight bearing as tolerated General:   Pain: Pt c/o 6/10 pain R LE medicated prior to treatment.    Therapy/Group: Individual Therapy  Dub Amis 03/26/2018, 10:12 AM

## 2018-03-27 ENCOUNTER — Inpatient Hospital Stay (HOSPITAL_COMMUNITY): Payer: Medicare Other | Admitting: Occupational Therapy

## 2018-03-27 ENCOUNTER — Inpatient Hospital Stay (HOSPITAL_COMMUNITY): Payer: Medicare Other | Admitting: Physical Therapy

## 2018-03-27 DIAGNOSIS — M316 Other giant cell arteritis: Secondary | ICD-10-CM

## 2018-03-27 DIAGNOSIS — S72009S Fracture of unspecified part of neck of unspecified femur, sequela: Secondary | ICD-10-CM

## 2018-03-27 DIAGNOSIS — I482 Chronic atrial fibrillation, unspecified: Secondary | ICD-10-CM

## 2018-03-27 LAB — CBC WITH DIFFERENTIAL/PLATELET
Abs Immature Granulocytes: 0.27 10*3/uL — ABNORMAL HIGH (ref 0.00–0.07)
Basophils Absolute: 0 10*3/uL (ref 0.0–0.1)
Basophils Relative: 0 %
Eosinophils Absolute: 0 10*3/uL (ref 0.0–0.5)
Eosinophils Relative: 0 %
HCT: 39 % (ref 36.0–46.0)
Hemoglobin: 11.8 g/dL — ABNORMAL LOW (ref 12.0–15.0)
Immature Granulocytes: 4 %
Lymphocytes Relative: 20 %
Lymphs Abs: 1.5 10*3/uL (ref 0.7–4.0)
MCH: 33.9 pg (ref 26.0–34.0)
MCHC: 30.3 g/dL (ref 30.0–36.0)
MCV: 112.1 fL — ABNORMAL HIGH (ref 80.0–100.0)
Monocytes Absolute: 1.1 10*3/uL — ABNORMAL HIGH (ref 0.1–1.0)
Monocytes Relative: 15 %
Neutro Abs: 4.7 10*3/uL (ref 1.7–7.7)
Neutrophils Relative %: 61 %
Platelets: 386 10*3/uL (ref 150–400)
RBC: 3.48 MIL/uL — ABNORMAL LOW (ref 3.87–5.11)
RDW: 16.8 % — ABNORMAL HIGH (ref 11.5–15.5)
WBC: 7.6 10*3/uL (ref 4.0–10.5)
nRBC: 0 % (ref 0.0–0.2)

## 2018-03-27 LAB — COMPREHENSIVE METABOLIC PANEL
ALT: 122 U/L — ABNORMAL HIGH (ref 0–44)
AST: 44 U/L — ABNORMAL HIGH (ref 15–41)
Albumin: 2.9 g/dL — ABNORMAL LOW (ref 3.5–5.0)
Alkaline Phosphatase: 107 U/L (ref 38–126)
Anion gap: 6 (ref 5–15)
BUN: 15 mg/dL (ref 8–23)
CO2: 28 mmol/L (ref 22–32)
Calcium: 8.8 mg/dL — ABNORMAL LOW (ref 8.9–10.3)
Chloride: 105 mmol/L (ref 98–111)
Creatinine, Ser: 0.84 mg/dL (ref 0.44–1.00)
GFR calc Af Amer: >60 mL/min (ref >60)
GFR calc non Af Amer: >60 mL/min (ref >60)
Glucose, Bld: 92 mg/dL (ref 70–99)
Potassium: 4.9 mmol/L (ref 3.5–5.1)
Sodium: 139 mmol/L (ref 135–145)
Total Bilirubin: 0.7 mg/dL (ref 0.3–1.2)
Total Protein: 5.2 g/dL — ABNORMAL LOW (ref 6.5–8.1)

## 2018-03-27 MED ORDER — FUROSEMIDE 20 MG PO TABS
20.0000 mg | ORAL_TABLET | Freq: Every day | ORAL | Status: DC
  Administered 2018-03-27 – 2018-03-30 (×4): 20 mg via ORAL
  Filled 2018-03-27 (×4): qty 1

## 2018-03-27 MED ORDER — PREDNISONE 5 MG PO TABS
7.5000 mg | ORAL_TABLET | Freq: Every day | ORAL | Status: DC
  Administered 2018-03-27 – 2018-03-30 (×4): 7.5 mg via ORAL
  Filled 2018-03-27 (×4): qty 2

## 2018-03-27 NOTE — Progress Notes (Signed)
Bilateral pitting edema, along with generalized edema. RLE with surgical dressings-C,D & I. RLE with ecchymosis and edema. PRN Oxy IR 10mg 's given at 0400, with relief. Patient refused scheduled colace and senna s. Patrici Ranks A

## 2018-03-27 NOTE — Progress Notes (Signed)
Social Work  Social Work Assessment and Plan  Patient Details  Name: Crystal [name] MRN: [id number] Date of Birth: Nov 12, 1945  Today's Date: 03/27/2018  Problem List:  Patient Active Problem List   Diagnosis Date Noted  . Femoral neck fracture ([place]) 03/24/2018  . Fracture of femoral neck, right ([place]) 03/22/2018  . Atrial fibrillation with RVR ([place]) 03/21/2018  . Atrial fibrillation ([place]) 03/18/2018  . 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 Paroxysmal atrial fibrillation ([facility])   . Herpes zoster without complication 65/68/1275  . Jaw claudication 07/27/2017  . Temporal arteritis ([place]) 07/18/2017  . Vision, loss, sudden, bilateral 07/18/2017  . 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 Parotitis 10/06/2016  . Osteoarthritis 06/25/2016  . Anxiety 06/25/2016  . Back pain 07/18/2015  . 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 Osteopenia 06/03/2008  . History of colonic polyps 06/03/2008  . INTENTION TREMOR 12/06/2007  . IBS 10/16/2007  . Hyperlipidemia 06/06/2007  . Essential hypertension 06/06/2007  . Chronic obstructive airway disease with asthma ([place]) 02/23/2007   Past Medical History:  Past Medical History:  Diagnosis Date  . Adenomatous colon polyp 2007 & 2010    Dr [name]  . Allergy    seasonal  . Arthritis   . Cataract   . Diverticulosis   . Femoral artery thrombosis, right () 09/01/2017  . Hyperlipidemia   . Hypertension   . IBS (irritable bowel syndrome)   . Legally blind   . MVP (mitral valve prolapse)   . Paroxysmal atrial fibrillation with rapid ventricular response ([place]) 08/29/2017  . Peripheral vascular disease ([place])   . RAD (reactive airway disease)    Past Surgical History:  Past Surgical History:  Procedure Laterality Date  . ABDOMINAL HYSTERECTOMY     with USO for dysfunctionall menses  . APPENDECTOMY    . ARTERY BIOPSY Bilateral 07/21/2017   Procedure: BIOPSY BILATERAL TEMPORAL ARTERY;  Surgeon: [name],  MD;  Location: [facility];  Service: Vascular;  Laterality: Bilateral;  . CATARACT EXTRACTION W/ INTRAOCULAR LENS IMPLANT  2013   bilateral; Dr [name]  . COLONOSCOPY  2013   negative , due 2018; Dr [name]  . COLONOSCOPY W/ POLYPECTOMY      X2 ; diverticulosis  . EMBOLECTOMY Right 08/29/2017   Procedure: Right Groin Exploration  ;  Surgeon: Rosetta Posner, MD;  Location: [facility];  Service: Vascular;  Laterality: Right;  . HIP PINNING,CANNULATED Right 03/21/2018  . HIP PINNING,CANNULATED Right 03/21/2018   Procedure: CANNULATED HIP PINNING;  Surgeon: Rod Can, MD;  Location: [facility];  Service: Orthopedics;  Laterality: Right;  . LUMBAR LAMINECTOMY  2000   Dr Vertell Limber  . SHOULDER SURGERY     R shoulder  . TONSILLECTOMY     Social History:  reports that she quit smoking about 28 years ago. She has a 23.00 pack-year smoking history. She has never used smokeless tobacco. She reports that she drinks about 14.0 standard drinks of alcohol per week. She reports that she does not use drugs.  Family / Support Systems Marital Status: Married Patient Roles: Spouse, Parent Spouse/Significant Other: spouse, Daizha Anand @ (H) [phone] or ([phone] Children: son, Audelia Acton and his wife, Collie Siad living locally and very involved Anticipated Caregiver: husband  Ability/Limitations of Caregiver: husband can provide supervision due to his phsyical limitations/possible some CGA; son can assist as needed but is not close by Caregiver Availability: 24/7 Family Dynamics: Spouse, son and daughter-in-law are very supportive and involved.  Social  History Preferred language: English Religion: Non-Denominational Cultural Background: NA Education: college Read: Yes Write: Yes Employment Status: Retired Freight forwarder Issues: None Guardian/Conservator: None - per MD, pt is capable of making decisions on her own behalf.   Abuse/Neglect Abuse/Neglect Assessment Can Be Completed: Yes Physical Abuse:  Denies Verbal Abuse: Denies Sexual Abuse: Denies Exploitation of patient/patient's resources: Denies Self-Neglect: Denies  Emotional Status Pt's affect, behavior adn adjustment status: Pt lying in bed with spouse and family around her.  Very pleasant and completes assessment interview without any difficulty.  Pt admits much frustration with her fall, fracture and current functional limitations.  Pt talks about his vision loss which occurred quickly in Feb 2019 due to temporal arteritis.  She denies any significant emotional distress - will monitor and refer for neuropsychology as indicated. Recent Psychosocial Issues: As noted, loss of vision in Feb 2019.  Reports she has been working with an OT at home. Pyschiatric History: None Substance Abuse History: None  Patient / Family Perceptions, Expectations & Goals Pt/Family understanding of illness & functional limitations: Pt and family with good understanding of her hip fx, limitations/ need for CIR. Premorbid pt/family roles/activities: Pt physically independent and still active at home and in the community.  Was working with OT on adaptation to life without sight and was set to begin working with someone for [facility] at the time of her fall. Anticipated changes in roles/activities/participation: Little change anticipated as spouse was providing any needed assistance PTA. Pt/family expectations/goals: "I just want to get through this and back on track with my other therapies."  [employer]: Other (Comment)([facility]; [place]) Premorbid Home Care/DME Agencies: Other (Comment)(spouse to provide name of agency OT is with) Transportation available at discharge: yes  Discharge Planning Living Arrangements: Spouse/significant other Support Systems: Spouse/significant other, Children, Friends/neighbors, Church/faith community Type of Residence: Private residence Insurance Resources: Medicare(Blue  Medicare) Financial Resources: Social Security Financial Screen Referred: No Living Expenses: Own Money Management: Spouse Does the patient have any problems obtaining your medications?: No Home Management: pt and spouse Patient/Family Preliminary Plans: Pt to d/c home with spouse as primary support.  Additional/ intermittnet support available from son and dtr-in-law. Social Work Anticipated Follow Up Needs: HH/OP Expected length of stay: 7-9 days  Clinical Impression Very pleasant woman here following a fall at home and suffering an hip fx.  Of note, acute, full vision loss in Feb 2019 and pt was actively working with a [facility] for adaptation therapy.  Family very supportive and spouse able to provide 24/7 assistance.  Anticipate short LOS. Pt denies any significant emotional distress.  Will follow for support and d/c planning needs.  Cleo Villamizar 03/27/2018, 4:25 PM

## 2018-03-27 NOTE — Progress Notes (Signed)
[facility] PHYSICAL MEDICINE & REHABILITATION PROGRESS NOTE   Subjective/Complaints: Asked why she wasn't getting lasix and why her prednisone was double its normal dose. Pain controlled.   ROS: Patient denies fever, rash, sore throat, blurred vision, nausea, vomiting, diarrhea, cough, shortness of breath or chest pain,   headache, or mood change.   Objective:   No results found. Recent Labs    03/27/18 0537  WBC 7.6  HGB 11.8*  HCT 39.0  PLT 386   Recent Labs    03/27/18 0537  NA 139  K 4.9  CL 105  CO2 28  GLUCOSE 92  BUN 15  CREATININE 0.84  CALCIUM 8.8*    Intake/Output Summary (Last 24 hours) at 03/27/2018 0846 Last data filed at 03/26/2018 2300 Gross per 24 hour  Intake 480 ml  Output -  Net 480 ml     Physical Exam: Vital Signs Blood pressure 111/74, pulse 61, temperature 97.6 F (36.4 C), temperature source Oral, resp. rate 16, height 5\' 3"  (1.6 m), weight 58.9 kg, SpO2 94 %.   Constitutional: No distress . Vital signs reviewed. HEENT: EOMI, oral membranes moist Neck: supple Cardiovascular: RRR without murmur. No JVD    Respiratory: CTA Bilaterally without wheezes or rales. Normal effort    GI: BS +, non-tender, non-distended  Ext: no clubbing, cyanosis, or 1+ RLE edema, particularly around hip Skin: No evidence of breakdown, no evidence of rash, surgical site cdi Neurologic: Blind. Cranial nerves II through XII intact, motor strength is 5/5 in bilateral deltoid, bicep, tricep, grip, LEFT hip flexor, knee extensors, ankle dorsiflexor and plantar flexor 2- R HF, 3- R KE  4/5 R ADF--unchanged Sensory exam normal sensation to light touch in bilateral upper and lower extremities  Musculoskeletal: right hip tender.   Assessment/Plan: 1. Functional deficits secondary to R FNF which require 3+ hours per day of interdisciplinary therapy in a comprehensive inpatient rehab setting.  Physiatrist is providing close team supervision and 24 hour management  of active medical problems listed below. Physiatrist and rehab team continue to assess barriers to discharge/monitor patient progress toward functional and medical  Goals   Care Tool:  Bathing    Body parts bathed by patient: Right arm, Left arm, Chest, Abdomen, Front perineal area, Buttocks, Right upper leg, Left upper leg, Right lower leg, Left lower leg, Face(used long sponge for R foot)         Bathing assist Assist Level: Contact Guard/Touching assist(CGA when standing)     Upper Body Dressing/Undressing Upper body dressing   What is the patient wearing?: Pull over shirt    Upper body assist Assist Level: Set up assist    Lower Body Dressing/Undressing Lower body dressing      What is the patient wearing?: Underwear/pull up     Lower body assist Assist for lower body dressing: Minimal Assistance - Patient > 75%     Toileting Toileting    Toileting assist Assist for toileting: Contact Guard/Touching assist     Transfers Chair/bed transfer  Transfers assist     Chair/bed transfer assist level: Contact Guard/Touching assist     Locomotion Ambulation   Ambulation assist      Assist level: Contact Guard/Touching assist Assistive device: Walker-rolling Max distance: 275   Walk 10 feet activity   Assist     Assist level: Contact Guard/Touching assist Assistive device: Walker-rolling   Walk 50 feet activity   Assist    Assist level: Contact Guard/Touching assist Assistive device: Walker-rolling  Walk 150 feet activity   Assist Walk 150 feet activity did not occur: Safety/medical concerns  Assist level: Contact Guard/Touching assist Assistive device: Walker-rolling    Walk 10 feet on uneven surface  activity   Assist Walk 10 feet on uneven surfaces activity did not occur: Safety/medical concerns         Wheelchair     Assist Will patient use wheelchair at discharge?: No   Wheelchair activity did not occur:  Safety/medical concerns         Wheelchair 50 feet with 2 turns activity    Assist    Wheelchair 50 feet with 2 turns activity did not occur: Safety/medical concerns       Wheelchair 150 feet activity     Assist Wheelchair 150 feet activity did not occur: Safety/medical concerns           Medical Problem List and Plan: 1.  Decreased functional mobility secondary to right neck fracture.  Status post percutaneous 03/21/2018 [facility] CIR PT, OT    2.  DVT Prophylaxis/Anticoagulation: Chronic Eliquis 3. Pain Management: Neurontin 500 mg nightly, oxycodone and Ultram as needed 4. Mood: Provide emotional support.Ativan 1mg  QHS 5. Neuropsych: This patient is capable of making decisions on her own behalf. 6. Skin/Wound Care: Routine skin checks 7. Fluids/Electrolytes/Nutrition: Routine and outs with follow-up chemistries  -I personally reviewed the patient's labs today.   8.  Atrial fibrillation/CV.  Cardiac rate controlled at 61 10/13.  Follow cardiology services as needed.  Amiodarone as directed, Lopressor 50 mg twice daily   -resume lasix at 20mg  daily (home dose 40mg ) to help with lower ext edem 9.  Hypothyroidism.  Synthroid 10.  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  Continue chronic prednisone but reduce to 7.5mg  home dose 11.  Asthma.  Continue inhalers as directed 12.  Constipation.  Laxative assistance, reports BM     LOS: 3 days A FACE TO FACE EVALUATION WAS PERFORMED  Meredith Staggers 03/27/2018, 8:46 AM

## 2018-03-27 NOTE — Progress Notes (Signed)
Physical Therapy Session Note  Patient Details  Name: Crystal [name] MRN: [id number] Date of Birth: 26-Jul-1945  Today's Date: 03/27/2018 PT Individual Time: 1100-1157 PT Individual Time Calculation (min): 57 min   Short Term Goals: Week 1:  PT Short Term Goal 1 (Week 1): STGs = LTGs  Skilled Therapeutic Interventions/Progress Updates:     Patient received up in [facility], pleasant and willing to participate in PT session today. Continued gait training multiple distances of 184f throughout session, with last gait distance being 2577fwith multiple standing rest breaks; she required MinA with RW and Min VC today due to tendency to drift strongly to L and R secondary to total blindness. Also performed exercise on Nustep with B UEs/LEs on level 2 with cues for activation of LE musculature/not to depend on UEs during activity. Also worked on step ups/step downs onto 4 inch box with B UE support per patient request as this is where she has the most difficulty at home due to vision loss, Mod cues for correct sequencing with surgical and non-surgical LEs. Patient and family education provided today regarding possible adaptive equipment such as audible motion sensor placed near stairs that will "beep" when she gets close to the step/stairs, and use of apps on iphone designed for people with blindness to improve ADLs and navigation in environment. [facility], husband present and assisting patient with eating lunch.   Therapy Documentation Precautions:  Precautions Precautions: Fall Precaution Comments: Blind Restrictions Weight Bearing Restrictions: Yes RLE Weight Bearing: Weight bearing as tolerated    Pain: Pain Assessment Pain Scale: 0-10 Pain Score: 4  Pain Type: Acute pain Pain Location: Leg Pain Orientation: Right Pain Radiating Towards: leg Pain Descriptors / Indicators: Aching;Sore Pain Frequency: Intermittent Pain Onset: On-going Patients Stated Pain Goal:  0 Pain Intervention(s): Ambulation/increased activity;Distraction;Environmental changes    Therapy/Group: Individual Therapy  KrDeniece ReeT, DPT, CBIS  Supplemental Physical Therapist [facility]  Pager [phone] [phone] 03/27/2018, 12:22 PM

## 2018-03-27 NOTE — Progress Notes (Signed)
Physical Therapy Session Note  Patient Details  Name: Crystal [name] MRN: [id number] Date of Birth: 05-Nov-1945  Today's Date: 03/27/2018 PT Individual Time: 1886-7737 PT Individual Time Calculation (min): 91 min   Short Term Goals: Week 1:  PT Short Term Goal 1 (Week 1): STGs = LTGs  Skilled Therapeutic Interventions/Progress Updates:    Patient received in bed, very pleasant and willing to participate in skilled PT session this afternoon. Able to complete functional bed mobility with general S, and functional transfers throughout session with min guard and RW. Transported totalA via WC to PT gym and worked on functional stretching of B LEs (within appropriate precautions and safety considerations) with significant reduction in pain reported by patient, noted significant guarding R LE as well as significant heel cord and hamstring tightness bilaterally. Continued working on functional gait training approximately 255f with RW and 2 standing rest breaks due to pain in R LE, also trialed gait with SPC with MinA for balance and ModA for facilitation of correct gait pattern, distances of approximately 178fthen 2084fore after seated rest break, cues for WBAT R LE and sequencing with cane. Otherwise focused on functional balance training today in various positions/foot positions on solid surface with and without head turns and external randomized perturbations. Able to maintain balance with min guard during all challenges but fatigued at EOS. She was [facility] back into bed with MinA, no device. She was left in bed with all needs met, family present and bed alarm active, RN aware of request for pain medicine.   Therapy Documentation Precautions:  Precautions Precautions: Fall Precaution Comments: Blind Restrictions Weight Bearing Restrictions: Yes RLE Weight Bearing: Weight bearing as tolerated General:   Pain: Pain Assessment Pain Scale: 0-10 Pain  Score: 3  Pain Type: Acute pain Pain Location: Leg Pain Orientation: Right Pain Descriptors / Indicators: Aching;Sore Pain Onset: On-going Patients Stated Pain Goal: 0 Pain Intervention(s): Ambulation/increased activity;Distraction;RN made aware    Therapy/Group: Individual Therapy  KriDeniece Ree, DPT, CBIS  Supplemental Physical Therapist [facility] Pager [phone] Rehab Office [phone] 151410/14/2019, 3:57 PM

## 2018-03-27 NOTE — Progress Notes (Signed)
Occupational Therapy Session Note  Patient Details  Name: Crystal [name] MRN: [id number] Date of Birth: Dec 24, 1945  Today's Date: 03/27/2018 OT Individual Time: 1015-1100 OT Individual Time Calculation (min): 45 min    Short Term Goals: Week 1:  OT Short Term Goal 1 (Week 1): STGs = LTGs  Skilled Therapeutic Interventions/Progress Updates:    1:1 Self care retraining at shower level. Pt in bed knitting when arrived. PT able to come to EOB without bed rail with supervision.  Pt able to demonstrate safe functional ambulation with RW with contact guard with directional cues to compensate for sight loss in new environment. Pt perform toileting with supervision and doffed clothing for showering. Min A with tactile cues for transitioning into shower. Pt able to bathe and dress sit to stand with contact guard to supervision. Pt performed standing balance without UE support for clothing management with close supervision and able to tolerate weight on right LE. Pt performed grooming at sink with setup. Discussed with husband and pt about DME at home and option of w/c for the community.   Therapy Documentation Precautions:  Precautions Precautions: Fall Precaution Comments: Blind Restrictions Weight Bearing Restrictions: Yes RLE Weight Bearing: Weight bearing as tolerated General:   Vital Signs: Therapy Vitals Temp: 98.2 F (36.8 C) Pulse Rate: 74 Resp: 18 BP: 115/80 Patient Position (if appropriate): Sitting Oxygen Therapy O2 Device: Room Air Pain: No c/o pain   Therapy/Group: Individual Therapy  Willeen Cass [facility] 03/27/2018, 2:37 PM

## 2018-03-27 NOTE — IPOC Note (Signed)
Overall Plan of Care [facility]) Patient Details Name: Crystal [name] MRN: [id number] DOB: 1946/01/04  Admitting Diagnosis: <principal problem not specified>  Hospital Problems: Active Problems:   Femoral neck fracture ([place])     Functional Problem List: Nursing Edema, Endurance, Medication Management, Motor, Pain, Safety, Skin Integrity  PT Balance, Endurance, Safety, Motor  OT Balance, Endurance, Pain, Vision  SLP    TR         Basic ADL's: OT Bathing, Dressing, Toileting     Advanced  ADL's: OT       Transfers: PT Bed Mobility, Bed to Chair, Car  OT Toilet, Tub/Shower     Locomotion: PT Ambulation, Stairs     Additional Impairments: OT None  SLP        TR      Anticipated Outcomes Item Anticipated Outcome  Self Feeding no goal  Swallowing      Basic self-care  supervision  Toileting  supervision   Bathroom Transfers supervision to toilet, CGA to walk in shower  Bowel/Bladder  Mod I assist  Transfers  S transfers  Locomotion  S gait, c/g stairs  Communication     Cognition     Pain  < 4  Safety/Judgment  Mod I assist   Therapy Plan: PT Intensity: Minimum of 1-2 x/day ,45 to 90 minutes PT Frequency: 5 out of 7 days PT Duration Estimated Length of Stay: 5 to 7 days OT Intensity: Minimum of 1-2 x/day, 45 to 90 minutes OT Frequency: 5 out of 7 days OT Duration/Estimated Length of Stay: 7-9 days      Team Interventions: Nursing Interventions Patient/Family Education, Pain Management, Medication Management, Discharge Planning, Skin Care/Wound Management  PT interventions Ambulation/gait training, Balance/vestibular training, Discharge planning, DME/adaptive equipment instruction, Functional mobility training, Neuromuscular re-education, Patient/family education, Stair training, Therapeutic Exercise, Therapeutic Activities, UE/LE Strength taining/ROM, UE/LE Coordination activities, Wheelchair propulsion/positioning, Visual/perceptual  remediation/compensation  OT Interventions Balance/vestibular training, Discharge planning, DME/adaptive equipment instruction, Functional mobility training, Pain management, Psychosocial support, Patient/family education, Self Care/advanced ADL retraining, Therapeutic Activities, Therapeutic Exercise, UE/LE Strength taining/ROM  SLP Interventions    TR Interventions    SW/CM Interventions Discharge Planning, Psychosocial Support, Patient/Family Education   Barriers to Discharge MD  Medical stability and vision  Nursing      PT Inaccessible home environment    OT      SLP      SW       Team Discharge Planning: Destination: PT-Home ,OT- Home , SLP-  Projected Follow-up: PT-Home health PT, OT-  Home health OT, SLP-  Projected Equipment Needs: PT- , OT- Tub/shower seat, SLP-  Equipment Details: PT-to be determined, OT-  Patient/family involved in discharge planning: PT- Patient, Family member/caregiver,  OT-Patient, Family member/caregiver, SLP-   MD ELOS: 7 days Medical Rehab Prognosis:  Excellent Assessment: The patient has been admitted for CIR therapies with the diagnosis of right FNF, temporal arteritis. The team will be addressing functional mobility, strength, stamina, balance, safety, adaptive techniques and equipment, self-care, bowel and bladder mgt, patient and caregiver education, pain mgt, visual-accommodation training, ego support. Goals have been set at supervision for self-care and mobility.    Meredith Staggers, MD, FAAPMR      See Team Conference Notes for weekly updates to the plan of care

## 2018-03-28 ENCOUNTER — Inpatient Hospital Stay (HOSPITAL_COMMUNITY): Payer: Medicare Other | Admitting: Physical Therapy

## 2018-03-28 ENCOUNTER — Inpatient Hospital Stay (HOSPITAL_COMMUNITY): Payer: Medicare Other | Admitting: Occupational Therapy

## 2018-03-28 ENCOUNTER — Inpatient Hospital Stay (HOSPITAL_COMMUNITY): Payer: Medicare Other

## 2018-03-28 NOTE — Progress Notes (Signed)
Occupational Therapy Session Note  Patient Details  Name: Crystal [name] MRN: [id number] Date of Birth: June 30, 1945  Today's Date: 03/28/2018 OT Individual Time: 1430-1530 OT Individual Time Calculation (min): 60 min    Short Term Goals: Week 1:  OT Short Term Goal 1 (Week 1): STGs = LTGs  Skilled Therapeutic Interventions/Progress Updates:    Upon entering the room, pt supine in bed with with 7/10 c/o pain in R LE. RN notified and pain medication given this session. Pt requesting to shower. Pt performed bed mobility with supervision and ambulating into bathroom with RW and min directional cuing secondary to visual loss. Pt transferred onto toilet to doff clothing with supervision for safety. Stand pivot transfer onto TTB in walk in shower and pt remained seated to wash body with overall supervision. Pt returning to toilet to don underwear and sleeping gown with supervision and min cuing for safety. Pt returning to bed with sit>supine at overall supervision in flat bed. OT discussed equipment recommendations and follow up in preparation for discharge on 10/17. Caregiver and pt with several questions and once answered they were very excited about upcoming discharge plans. Pt remained supine with bed alarm activated and call bell within reach.   Therapy Documentation Precautions:  Precautions Precautions: Fall Precaution Comments: Blind Restrictions Weight Bearing Restrictions: Yes RLE Weight Bearing: Weight bearing as tolerated General:   Vital Signs: Therapy Vitals Temp: 97.7 F (36.5 C) Pulse Rate: (!) 59 Resp: 16 BP: 104/65 Patient Position (if appropriate): Lying Oxygen Therapy SpO2: 95 % O2 Device: Room Air Pain: Pain Assessment Pain Scale: 0-10 Pain Score: 7  Pain Type: Acute pain Pain Location: Hip Pain Orientation: Right Pain Descriptors / Indicators: Aching;Discomfort Pain Frequency: Constant Pain Onset: On-going Patients Stated Pain Goal: 2 Pain Intervention(s):  Medication (See eMAR) ADL: ADL Eating: Set up, Supervision/safety Where Assessed-Eating: Edge of bed Grooming: Supervision/safety Where Assessed-Grooming: Sitting at sink Upper Body Bathing: Setup, Supervision/safety Where Assessed-Upper Body Bathing: Shower Lower Body Bathing: Contact guard Where Assessed-Lower Body Bathing: Shower Upper Body Dressing: Setup Where Assessed-Upper Body Dressing: Chair Lower Body Dressing: Minimal assistance Where Assessed-Lower Body Dressing: Chair Toileting: Contact guard Where Assessed-Toileting: Glass blower/designer: Moderate verbal cueing, Minimal assistance Toilet Transfer Method: Counselling psychologist: Raised toilet seat Social research officer, government: Moderate cueing, Minimal assistance Social research officer, government Method: Heritage manager: Radio broadcast assistant   Therapy/Group: Individual Therapy  Gypsy Decant 03/28/2018, 4:21 PM

## 2018-03-28 NOTE — Progress Notes (Signed)
Physical Therapy Session Note  Patient Details  Name: Crystal [name] MRN: [id number] Date of Birth: Dec 03, 1945  Today's Date: 03/28/2018 PT Individual Time: 1000-1128 PT Individual Time Calculation (min): 88 min   Short Term Goals: Week 1:  PT Short Term Goal 1 (Week 1): STGs = LTGs  Skilled Therapeutic Interventions/Progress Updates:    Patient received in bed, very pleasant and willing to participate in PT session today. Able to complete all bed mobility with S, functional transfers and gait with S and RW today. Able to ambulate in hallway distances varying from 100-142f with RW and S this morning. Rode Nustep for 10 minutes with B UEs/LEs on level 3, no rest breaks, and then ambulated into PT gym. No mat table available so [facility] up backwards onto step stool to get into/out of her tall bed at home, also performed functional stretching to B LEs to assist in reducing pain within safe precautions/restrictions of surgery. Otherwise worked on bHotel manageron foam pad with and without dynamic UE movement and min guard-MinA to maintain balance today. She was left in bed with all needs met and bed alarm activated this morning.   Therapy Documentation Precautions:  Precautions Precautions: Fall Precaution Comments: Blind Restrictions Weight Bearing Restrictions: Yes RLE Weight Bearing: Weight bearing as tolerated General:   Vital Signs:  Pain: Pain Assessment Pain Scale: 0-10 Pain Score: 7  Pain Type: Acute pain Pain Location: Leg Pain Orientation: Right Pain Descriptors / Indicators: Aching;Throbbing;Discomfort Pain Frequency: Constant Pain Onset: On-going Patients Stated Pain Goal: 0 Pain Intervention(s): Distraction;Ambulation/increased activity;Other (Comment)(premedicated per her report )    Therapy/Group: Individual Therapy  KDeniece ReePT, DPT, CBIS  Supplemental Physical Therapist [facility]    Pager [phone] Rehab Office [phone]  03/28/2018, 12:37 PM

## 2018-03-28 NOTE — Progress Notes (Signed)
PHYSICAL MEDICINE & REHABILITATION PROGRESS NOTE   Subjective/Complaints: Pt at EOB eating breakfast. husbnad is assisting. Sore from therapy yesterday but happy with progress  ROS: Patient denies fever, rash, sore throat, blurred vision, nausea, vomiting, diarrhea, cough, shortness of breath or chest pain,   back pain, headache, or mood change.   Objective:   No results found. Recent Labs    03/27/18 0537  WBC 7.6  HGB 11.8*  HCT 39.0  PLT 386   Recent Labs    03/27/18 0537  NA 139  K 4.9  CL 105  CO2 28  GLUCOSE 92  BUN 15  CREATININE 0.84  CALCIUM 8.8*    Intake/Output Summary (Last 24 hours) at 03/28/2018 0903 Last data filed at 03/27/2018 1700 Gross per 24 hour  Intake 360 ml  Output -  Net 360 ml     Physical Exam: Vital Signs Blood pressure (!) 99/52, pulse 60, temperature (!) 97.4 F (36.3 C), resp. rate 18, height 5\' 3"  (1.6 m), weight 58.9 kg, SpO2 98 %.   Constitutional: No distress . Vital signs reviewed. HEENT: EOMI, oral membranes moist Neck: supple Cardiovascular: RRR without murmur. No JVD    Respiratory: CTA Bilaterally without wheezes or rales. Normal effort    GI: BS +, non-tender, non-distended  Ext: no clubbing, cyanosis, or 1+ RLE edema, particularly around hip Skin: No evidence of breakdown, no evidence of rash, surgical site cdi Neurologic: Blind. Cranial nerves II through XII intact, motor strength is 5/5 in bilateral deltoid, bicep, tricep, grip, LEFT hip flexor, knee extensors, ankle dorsiflexor and plantar flexor 2- R HF, 3- R KE  4/5 R ADF--unchanged. Limited by pain Sensory exam normal sensation to light touch in bilateral upper and lower extremities  Musculoskeletal: right hip remains tender.   Assessment/Plan: 1. Functional deficits secondary to R FNF which require 3+ hours per day of interdisciplinary therapy in a comprehensive inpatient rehab setting.  Physiatrist is providing close team supervision and 24  hour management of active medical problems listed below. Physiatrist and rehab team continue to assess barriers to discharge/monitor patient progress toward functional and medical  Goals   Care Tool:  Bathing    Body parts bathed by patient: Right arm, Left arm, Chest, Abdomen, Front perineal area, Buttocks, Right upper leg, Left upper leg, Right lower leg, Left lower leg, Face         Bathing assist Assist Level: Supervision/Verbal cueing     Upper Body Dressing/Undressing Upper body dressing   What is the patient wearing?: Bra, Pull over shirt    Upper body assist Assist Level: Set up assist    Lower Body Dressing/Undressing Lower body dressing      What is the patient wearing?: Underwear/pull up, Pants     Lower body assist Assist for lower body dressing: Set up assist     Toileting Toileting    Toileting assist Assist for toileting: Contact Guard/Touching assist     Transfers Chair/bed transfer  Transfers assist     Chair/bed transfer assist level: Contact Guard/Touching assist     Locomotion Ambulation   Ambulation assist      Assist level: Contact Guard/Touching assist Assistive device: Walker-rolling Max distance: 250   Walk 10 feet activity   Assist     Assist level: Contact Guard/Touching assist Assistive device: Walker-rolling   Walk 50 feet activity   Assist    Assist level: Contact Guard/Touching assist Assistive device: Walker-rolling    Walk 150 feet activity  Assist Walk 150 feet activity did not occur: Safety/medical concerns  Assist level: Contact Guard/Touching assist Assistive device: Walker-rolling    Walk 10 feet on uneven surface  activity   Assist Walk 10 feet on uneven surfaces activity did not occur: Safety/medical concerns         Wheelchair     Assist Will patient use wheelchair at discharge?: No   Wheelchair activity did not occur: Safety/medical concerns         Wheelchair 50 feet  with 2 turns activity    Assist    Wheelchair 50 feet with 2 turns activity did not occur: Safety/medical concerns       Wheelchair 150 feet activity     Assist Wheelchair 150 feet activity did not occur: Safety/medical concerns           Medical Problem List and Plan: 1.  Decreased functional mobility secondary to right neck fracture.  Status post percutaneous 03/21/2018 [facility] CIR PT, OT    2.  DVT Prophylaxis/Anticoagulation: Chronic Eliquis 3. Pain Management: Neurontin 500 mg nightly, oxycodone and Ultram as needed 4. Mood: Provide emotional support.Ativan 1mg  QHS 5. Neuropsych: This patient is capable of making decisions on her own behalf. 6. Skin/Wound Care: Routine skin checks 7. Fluids/Electrolytes/Nutrition:   -encourage PO 8.  Atrial fibrillation/CV.  Cardiac rate controlled.  Follow up with cardiology services as needed.  Amiodarone as directed, Lopressor 50 mg twice daily   -no abnl elevations with activity  -resumed lasix at 20mg  daily (home dose 40mg ) to help with lower ext edem  -recheck electrolytes tomorrow 9.  Hypothyroidism.  Synthroid 10.  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  Continue chronic prednisone     reduced to 7.5mg  home dose 11.  Asthma.  Continue inhalers as directed 12.  Constipation.  Laxative assistance, reports BM     LOS: 4 days A FACE TO [place] 03/28/2018, 9:03 AM

## 2018-03-28 NOTE — Progress Notes (Signed)
Physical Therapy Session Note  Patient Details  Name: Crystal [name] MRN: [id number] Date of Birth: 24-Aug-1945  Today's Date: 03/28/2018 PT Individual Time: 0800-0900 PT Individual Time Calculation (min): 60 min   Short Term Goals: Week 1:  PT Short Term Goal 1 (Week 1): STGs = LTGs  Skilled Therapeutic Interventions/Progress Updates:    Pt supine in bed upon PT arrival, agreeable to therapy tx and denies pain. Pt transferred to EOB with supervision. Pt ambulated in/out of bathroom with RW and assist from husband, husband providing appropriate cues and checked off for transfers within the room via ambulation with RW. Pt transferred to w/c and transported to the rehab apartment. Pt performed x 3 trials performing transfer on/off bed with L lateral step onto step up, mod assist and verbal cueing. Pt reports feeling unsafe this way and rehab apartment bed is shorter than her bed at home. Pt ambulated to w/c with RW and CGA, transported to the gym. Pt performed x 15 lateral step ups using handrail and 6 inch step for strengthening, verbal cues for placement/techniques. Pt worked on ascending/descending 4 inch platform step with RW and min assist in order to simulate her step from kitchen<>den at home, pt performed this x4 with verbal cues for RW placement and techniques. Pt performed transfer on/off 30 inch height mat x 3 this session, backing up to step with RW and sitting back on bed, verbal cues for techniques. Therapist discussed having family bring in actual step used at home to improve simulation. Pt ambulated to w/c and transported back to room, stand pivot to bed min assist and left seated EOB in care of husband.   Therapy Documentation Precautions:  Precautions Precautions: Fall Precaution Comments: Blind Restrictions Weight Bearing Restrictions: Yes RLE Weight Bearing: Weight bearing as tolerated    Therapy/Group: Individual Therapy  Netta Corrigan, PT, DPT 03/28/2018, 7:53 AM

## 2018-03-28 NOTE — Care Management (Signed)
[facility] Individual Statement of Services  Patient Name:  Crystal [name]  Date:  03/28/2018  Welcome to the [facility].  Our goal is to provide you with an individualized program based on your diagnosis and situation, designed to meet your specific needs.  With this comprehensive rehabilitation program, you will be expected to participate in at least 3 hours of rehabilitation therapies Monday-Friday, with modified therapy programming on the weekends.  Your rehabilitation program will include the following services:  Physical Therapy (PT), Occupational Therapy (OT), 24 hour per day rehabilitation nursing, Therapeutic Recreaction (TR), Case Management (Social Worker), Rehabilitation Medicine, Nutrition Services and Pharmacy Services  Weekly team conferences will be held on Tuesdays to discuss your progress.  Your Social Worker will talk with you frequently to get your input and to update you on team discussions.  Team conferences with you and your family in attendance may also be held.  Expected length of stay: 7 days   Overall anticipated outcome: supervision  Depending on your progress and recovery, your program may change. Your Social Worker will coordinate services and will keep you informed of any changes. Your Social Worker's name and contact numbers are listed  below.  The following services may also be recommended but are not provided by the [facility] will be made to provide these services after discharge if needed.  Arrangements include referral to agencies that provide these services.  Your insurance has been verified to be:  [employer] Your primary doctor is:  [place]  Pertinent information will be shared with your doctor and your insurance company.  Social Worker:  [place], [place] or ([phone]   Information discussed with and copy given to patient by: Lennart Pall, 03/28/2018, 10:52 AM

## 2018-03-29 ENCOUNTER — Inpatient Hospital Stay (HOSPITAL_COMMUNITY): Payer: Medicare Other

## 2018-03-29 ENCOUNTER — Inpatient Hospital Stay (HOSPITAL_COMMUNITY): Payer: Medicare Other | Admitting: Physical Therapy

## 2018-03-29 ENCOUNTER — Inpatient Hospital Stay (HOSPITAL_COMMUNITY): Payer: Medicare Other | Admitting: Occupational Therapy

## 2018-03-29 LAB — BASIC METABOLIC PANEL
Anion gap: 10 (ref 5–15)
BUN: 14 mg/dL (ref 8–23)
CO2: 25 mmol/L (ref 22–32)
Calcium: 8.9 mg/dL (ref 8.9–10.3)
Chloride: 103 mmol/L (ref 98–111)
Creatinine, Ser: 0.87 mg/dL (ref 0.44–1.00)
GFR calc Af Amer: >60 mL/min (ref >60)
GFR calc non Af Amer: >60 mL/min (ref >60)
Glucose, Bld: 74 mg/dL (ref 70–99)
Potassium: 4 mmol/L (ref 3.5–5.1)
Sodium: 138 mmol/L (ref 135–145)

## 2018-03-29 NOTE — Progress Notes (Signed)
[place] PHYSICAL MEDICINE & REHABILITATION PROGRESS NOTE   Subjective/Complaints: Eating breakfast. Pain present but improving. Excited about going tomorrow  ROS: Patient denies fever, rash, sore throat, blurred vision, nausea, vomiting, diarrhea, cough, shortness of breath or chest pain, joint or back pain, headache, or mood change.   Objective:   No results found. Recent Labs    03/27/18 0537  WBC 7.6  HGB 11.8*  HCT 39.0  PLT 386   Recent Labs    03/27/18 0537 03/29/18 0543  NA 139 138  K 4.9 4.0  CL 105 103  CO2 28 25  GLUCOSE 92 74  BUN 15 14  CREATININE 0.84 0.87  CALCIUM 8.8* 8.9    Intake/Output Summary (Last 24 hours) at 03/29/2018 0827 Last data filed at 03/29/2018 0826 Gross per 24 hour  Intake 600 ml  Output -  Net 600 ml     Physical Exam: Vital Signs Blood pressure 95/66, pulse 91, temperature 98.3 F (36.8 C), temperature source Oral, resp. rate 16, height 5\' 3"  (1.6 m), weight 59.1 kg, SpO2 96 %.   Constitutional: No distress . Vital signs reviewed. HEENT: EOMI, oral membranes moist Neck: supple Cardiovascular: RRR without murmur. No JVD    Respiratory: CTA Bilaterally without wheezes or rales. Normal effort    GI: BS +, non-tender, non-distended  Ext: no clubbing, cyanosis, or 1+ RLE edema, particularly around hip Skin: No evidence of breakdown, no evidence of rash, surgical site cdi Neurologic: Blind. Cranial nerves II through XII intact, motor strength is 5/5 in bilateral deltoid, bicep, tricep, grip, HF, knee extensors, ankle dorsiflexor and plantar flexor 2- R HF, 3- R KE  4/5 R ADF--unchanged. Limited by pain Sensory exam normal sensation to light touch in bilateral upper and lower extremities  Musculoskeletal: right hip remains tender with PROM/AROM   Assessment/Plan: 1. Functional deficits secondary to R FNF which require 3+ hours per day of interdisciplinary therapy in a comprehensive inpatient rehab setting.  Physiatrist  is providing close team supervision and 24 hour management of active medical problems listed below. Physiatrist and rehab team continue to assess barriers to discharge/monitor patient progress toward functional and medical  Goals   Care Tool:  Bathing    Body parts bathed by patient: Right arm, Left arm, Chest, Abdomen, Front perineal area, Buttocks, Right upper leg, Left upper leg, Right lower leg, Left lower leg, Face         Bathing assist Assist Level: Supervision/Verbal cueing     Upper Body Dressing/Undressing Upper body dressing   What is the patient wearing?: Dress    Upper body assist Assist Level: Set up assist    Lower Body Dressing/Undressing Lower body dressing      What is the patient wearing?: Underwear/pull up     Lower body assist Assist for lower body dressing: Supervision/Verbal cueing     Toileting Toileting    Toileting assist Assist for toileting: Supervision/Verbal cueing     Transfers Chair/bed transfer  Transfers assist     Chair/bed transfer assist level: Supervision/Verbal cueing     Locomotion Ambulation   Ambulation assist      Assist level: Minimal Assistance - Patient > 75% Assistive device: Walker-rolling Max distance: 258ft    Walk 10 feet activity   Assist     Assist level: Minimal Assistance - Patient > 75% Assistive device: Walker-rolling   Walk 50 feet activity   Assist    Assist level: Minimal Assistance - Patient > 75% Assistive device:  Walker-rolling    Walk 150 feet activity   Assist Walk 150 feet activity did not occur: Safety/medical concerns  Assist level: Minimal Assistance - Patient > 75% Assistive device: Walker-rolling    Walk 10 feet on uneven surface  activity   Assist Walk 10 feet on uneven surfaces activity did not occur: Safety/medical concerns         Wheelchair     Assist Will patient use wheelchair at discharge?: No   Wheelchair activity did not occur:  Safety/medical concerns         Wheelchair 50 feet with 2 turns activity    Assist    Wheelchair 50 feet with 2 turns activity did not occur: Safety/medical concerns       Wheelchair 150 feet activity     Assist Wheelchair 150 feet activity did not occur: Safety/medical concerns           Medical Problem List and Plan: 1.  Decreased functional mobility secondary to right neck fracture.  Status post percutaneous 03/21/2018 [facility] PT, OT  -ELOS 10/17    2.  DVT Prophylaxis/Anticoagulation: Chronic Eliquis 3. Pain Management: Neurontin 500 mg nightly, oxycodone and Ultram as needed 4. Mood: Provide emotional support.Ativan 1mg  QHS 5. Neuropsych: This patient is capable of making decisions on her own behalf. 6. Skin/Wound Care: Routine skin checks 7. Fluids/Electrolytes/Nutrition:   -encourage PO 8.  Atrial fibrillation/CV.  Cardiac rate controlled.  Follow up with cardiology services as needed.  Amiodarone as directed, Lopressor 50 mg twice daily   -well controlled  -resumed lasix at 20mg  daily (home dose 40mg ) to help with lower ext edem--can resume 40mg  dose once home  -I personally reviewed the patient's labs today.  normal 9.  Hypothyroidism.  Synthroid 10.  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  Continue chronic prednisone     reduced to 7.5mg  home dose 11.  Asthma.  Continue inhalers as directed 12.  Constipation.  Laxative assistance, reports BM 10/16    LOS: 5 days A FACE TO [place] 03/29/2018, 8:27 AM

## 2018-03-29 NOTE — Progress Notes (Signed)
Physical Therapy Discharge Summary  Patient Details  Name: Crystal [name] MRN: [id number] Date of Birth: July 05, 1945  Today's Date: 03/29/2018 PT Individual Time: 0900-1000 and 1430-1500 PT Individual Time Calculation (min): 60 min and 30 min    Patient has met 8 of 8 long term goals due to improved activity tolerance, improved balance and increased strength.  Patient to discharge at an ambulatory level supervision to [place].   Patient's care partner is independent to provide the necessary physical assistance at discharge.   Recommendation:  Patient will benefit from ongoing skilled PT services in home health setting to continue to advance safe functional mobility, address ongoing impairments in strength, balance, and pain, and minimize fall risk.  Equipment: RW  Reasons for discharge: treatment goals met  Patient/family agrees with progress made and goals achieved: Yes   Skilled PT Intervention: Session 1: c/o 6/10 pain and premedicated.  Session focus on d/c assessment and d/c planning.  Pt currently performing all mobility with CGA or better (see below for details).  Requires at least CGA in this setting 2/2 blindness that is pre-morbid.  Expect pt to require less assist once in home environment where things are familiar.  Discussed f/u with HHPT and use of RW at home.  Pt returned to room at end of session and positioned in bed with call bell in reach and needs met.    Session 2: pt reporting increased pain but premedicated, agreeable to therapy and asking to remain bedside.  PT instructed pt in BLE therex x10-15 reps in sitting and standing as below.  Remains seated EOB at end of session with family present at bedside.    Sitting: heel/toe raises, marches, LAQ with 2 second hold, pillow squeezes Standing: heel raises, minisquats, marches, hamstring curls  PT Discharge Precautions/Restrictions Precautions Precautions: Fall Precaution Comments: Blind Restrictions Weight Bearing  Restrictions: Yes RLE Weight Bearing: Weight bearing as tolerated Pain Pain Assessment Pain Scale: 0-10 Pain Score: 6  Pain Type: Surgical pain Pain Location: Hip Pain Orientation: Right Pain Radiating Towards: to knee Pain Descriptors / Indicators: Aching Pain Frequency: Constant Pain Onset: On-going Pain Intervention(s): Medication (See eMAR) Vision/Perception  Vision - Assessment Additional Comments: blind Perception Perception: Within Functional Limits Praxis Praxis: Intact  Cognition Overall Cognitive Status: Within Functional Limits for tasks assessed Arousal/Alertness: Awake/alert Orientation Level: Oriented X4 Memory: Appears intact Awareness: Appears intact Safety/Judgment: Appears intact Sensation Sensation Light Touch: Appears Intact Coordination Gross Motor Movements are Fluid and Coordinated: Yes Fine Motor Movements are Fluid and Coordinated: Yes Motor  Motor Motor: Within Functional Limits  Mobility Bed Mobility Bed Mobility: Supine to Sit;Sit to Supine Supine to Sit: Supervision/Verbal cueing Sit to Supine: Supervision/Verbal cueing Transfers Transfers: Sit to Stand;Stand to Sit;Stand Pivot Transfers Sit to Stand: Supervision/Verbal cueing Stand to Sit: Supervision/Verbal cueing Stand Pivot Transfers: Supervision/Verbal cueing Transfer (Assistive device): Rolling walker Locomotion  Gait Ambulation: Yes Gait Assistance: Contact Guard/Touching assist Gait Distance (Feet): 160 Feet Assistive device: Rolling walker Gait Assistance Details: cues for steering 2/2 blindness Stairs / Additional Locomotion Stairs: Yes Stairs Assistance: Contact Guard/Touching assist Stair Management Technique: One rail Left Number of Stairs: 4  Trunk/Postural Assessment     Balance Balance Balance Assessed: Yes Static Sitting Balance Static Sitting - Balance Support: Feet supported Static Sitting - Level of Assistance: 6: Modified independent (Device/Increase  time) Static Standing Balance Static Standing - Balance Support: During functional activity Static Standing - Level of Assistance: 5: Stand by assistance Dynamic Standing Balance Dynamic Standing - Balance  Support: During functional activity Dynamic Standing - Level of Assistance: 5: Stand by assistance Extremity Assessment      RLE Assessment RLE Assessment: Exceptions to [facility] General Strength Comments: grossly 3/5, did not apply resistance 2/2 pain LLE Assessment LLE Assessment: Within Functional Limits General Strength Comments: 5/5 proximal to distal, except 4/5 in hip flexion    Michel Santee 03/29/2018, 10:09 AM

## 2018-03-29 NOTE — Progress Notes (Signed)
Occupational Therapy Session Note  Patient Details  Name: Crystal [name] MRN: [id number] Date of Birth: 1946/05/01  Today's Date: 03/29/2018 OT Individual Time: 5027-7412 OT Individual Time Calculation (min): 48 min    Short Term Goals: Week 1:  OT Short Term Goal 1 (Week 1): STGs = LTGs  Skilled Therapeutic Interventions/Progress Updates:    Session focused on functional mobility and shower transfers. Pt completed 175 ft of functional mobility with 2 standing rest breaks and 1 extended seated rest break. Pt required heavy vc for maintaining straight trajectory down hallway. Pt completed walk in shower transfer, stepping in and out x5. Pt returned to room in similar fashion and was left sitting EOB. RN alerted to pt's R hip pain and entered room to administer meds.   Therapy Documentation Precautions:  Precautions Precautions: Fall Precaution Comments: Blind Restrictions Weight Bearing Restrictions: Yes RLE Weight Bearing: Weight bearing as tolerated    Pain: Pain Assessment Pain Scale: 0-10 Pain Score: 7  Pain Type: Acute pain Pain Location: Hip Pain Orientation: Right Pain Descriptors / Indicators: Aching Pain Frequency: Constant Pain Onset: On-going Pain Intervention(s): Rest;RN made aware   Therapy/Group: Individual Therapy  Curtis Sites 03/29/2018, 2:02 PM

## 2018-03-29 NOTE — Progress Notes (Signed)
Occupational Therapy Discharge Summary  Patient Details  Name: Crystal [name] MRN: [id number] Date of Birth: 04-15-1946  Today's Date: 03/29/2018 OT Individual Time: 3818-2993 OT Individual Time Calculation (min): 84 min    Patient has met 7 of 7 long term goals due to improved activity tolerance and improved balance.  Patient to discharge at overall Supervision level.  Patient's care partner is independent to provide the necessary cuing assistance at discharge.    Reasons goals not met: all goals met  Recommendation:  Patient will benefit from ongoing skilled OT services in home health setting  to continue to advance functional skills in the area of BADL and iADL.  Equipment: 3 in 1 commode chair and shower seat  Reasons for discharge: treatment goals met  Patient/family agrees with progress made and goals achieved: Yes   OT Intervention: Upon entering the room, pt supine in bed but agreeable to OT intervention and requesting to shower this session. Pt ambulating throughout room with use of RW and verbal cuing for directions secondary to visual loss. Pt ambulating to bathroom for toileting needs with overall supervision and removing clothing while seated on commode. Pt transferred into shower and bathing with sit <>stand and use of grab bar with supervision. Pt returning to commode chair to don clothing items with increased time and supervision with balance during LB clothing management. Pt returning to sit at sink for grooming tasks with overall supervision as well. Pt's husband present with no further questions or concerns this session.   OT Discharge Precautions/Restrictions  Precautions Precautions: Fall Precaution Comments: Blind Restrictions Weight Bearing Restrictions: Yes RLE Weight Bearing: Weight bearing as tolerated General   Vital Signs Therapy Vitals Temp: 98.2 F (36.8 C) Temp Source: Oral Pulse Rate: 85 Resp: 17 BP: 114/65 Patient Position (if appropriate):  Sitting Oxygen Therapy SpO2: 94 % O2 Device: Room Air Pain Pain Assessment Pain Scale: 0-10 Pain Score: 2  Pain Type: Acute pain Pain Location: Hip Pain Orientation: Right Pain Descriptors / Indicators: Aching Pain Onset: On-going Patients Stated Pain Goal: 2 Pain Intervention(s): Rest;RN made aware Multiple Pain Sites: No ADL ADL Eating: Set up, Supervision/safety Where Assessed-Eating: Edge of bed Grooming: Supervision/safety Where Assessed-Grooming: Sitting at sink Upper Body Bathing: Setup, Supervision/safety Where Assessed-Upper Body Bathing: Shower Lower Body Bathing: Contact guard Where Assessed-Lower Body Bathing: Shower Upper Body Dressing: Setup Where Assessed-Upper Body Dressing: Chair Lower Body Dressing: Minimal assistance Where Assessed-Lower Body Dressing: Chair Toileting: Contact guard Where Assessed-Toileting: Glass blower/designer: Moderate verbal cueing, Minimal assistance Toilet Transfer Method: Counselling psychologist: Raised toilet seat Social research officer, government: Moderate cueing, Minimal assistance Social research officer, government Method: Magazine features editor Patient Visual Report: No change from baseline Praxis Praxis: Intact Cognition Overall Cognitive Status: Within Functional Limits for tasks assessed Arousal/Alertness: Awake/alert Orientation Level: Oriented X4 Sensation Sensation Light Touch: Appears Intact Hot/Cold: Appears Intact Proprioception: Appears Intact Stereognosis: Appears Intact Coordination Gross Motor Movements are Fluid and Coordinated: Yes Fine Motor Movements are Fluid and Coordinated: Yes Motor  Motor Motor: Within Functional Limits Mobility  Bed Mobility Bed Mobility: Supine to Sit;Sit to Supine Supine to Sit: Supervision/Verbal cueing Sit to Supine: Supervision/Verbal cueing Transfers Sit to Stand: Supervision/Verbal cueing Stand to Sit: Supervision/Verbal  cueing  Trunk/Postural Assessment  Cervical Assessment Cervical Assessment: Within Functional Limits Thoracic Assessment Thoracic Assessment: Within Functional Limits Lumbar Assessment Lumbar Assessment: Within Functional Limits Postural Control Postural Control: Deficits on evaluation  Balance Balance Balance Assessed: Yes Static Sitting Balance  Static Sitting - Balance Support: Feet supported Static Sitting - Level of Assistance: 6: Modified independent (Device/Increase time) Static Standing Balance Static Standing - Balance Support: During functional activity Static Standing - Level of Assistance: 5: Stand by assistance Dynamic Standing Balance Dynamic Standing - Balance Support: During functional activity Dynamic Standing - Level of Assistance: 5: Stand by assistance Extremity/Trunk Assessment RUE Assessment RUE Assessment: Within Functional Limits LUE Assessment LUE Assessment: Within Functional Limits   Gypsy Decant 03/29/2018, 4:40 PM

## 2018-03-29 NOTE — Discharge Summary (Signed)
NAME: Crystal [name], GLUTH MEDICAL RECORD AS:[id number] ACCOUNT [id number] DATE OF BIRTH:1945-08-12 FACILITY: [facility] LOCATION: [facility] PHYSICIAN:ZACHARY SWARTZ, MD  DISCHARGE SUMMARY  DATE OF DISCHARGE:  03/30/2018  DISCHARGE DIAGNOSES: 1.  Decreased functional mobility secondary to right femoral neck fracture status post percutaneous pinning 03/21/2018. 2.  Deep venous thrombosis prophylaxis with chronic Eliquis, pain management. 3.  Atrial fibrillation with cardioversion. 4.  Hypothyroidism. 5.  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 6.  Asthma. 7.  Constipation.  HISTORY OF PRESENT ILLNESS:  This is a 72 year old right-handed female with history of atrial fibrillation, maintained on Eliquis, temporal arteritis with total blindness maintained on chronic prednisone.  Lives with spouse.  Multilevel home.  Reported  to use a cane prior to admission and assistance from her husband.  Presented 03/18/2018 after a mechanical fall.  No loss of consciousness.  Cranial CT scan as well as CT cervical spine negative.  X-rays and imaging revealed right femoral neck fracture.   Preoperative clearance by cardiology services.  Initially transitioned to intravenous heparin with Eliquis held.  Planned surgical intervention.  Underwent percutaneous screw fixation 03/21/2018 per Dr. Lyla Glassing.  HOSPITAL COURSE:  Pain management.  Weightbearing as tolerated.  Eliquis resumed.  Therapies initiated, and patient was admitted for a comprehensive rehab program.  PAST MEDICAL HISTORY:  See discharge diagnoses.  SOCIAL HISTORY:  Lives with spouse.  FUNCTIONAL STATUS:  Upon admission to rehab services was minimal assist 26 feet rolling walker, minimal assist with stand pivot transfers, min mod assist with activities of daily living.  PHYSICAL EXAMINATION: VITAL SIGNS:  Blood pressure 114/66, pulse 103, temperature 97, respirations 18. GENERAL:  Alert female, totally blind.  Follows commands, oriented to person, place and  time. CARDIOVASCULAR:  Rate controlled. ABDOMEN:  Soft, nontender, good bowel sounds. LUNGS:  Clear to auscultation without wheeze.  Surgical site clean and dry.  REHABILITATION HOSPITAL COURSE:  The patient was admitted to inpatient rehab services.  Therapies initiated on a 3-hour daily basis, consisting of physical therapy, occupational therapy and rehabilitation nursing.  The following issues were addressed  during patient's rehabilitation stay.  Pertaining to the patient's right femoral neck fracture, underwent percutaneous fixation 03/21/2018.  Neurovascular sensation intact.  She would follow up with orthopedic services.  Weightbearing as tolerated.  She  continued on chronic Eliquis for history of atrial fibrillation.  Cardiac rate controlled.  No chest pain or shortness of breath.  She would follow up with cardiology services.  Pain management, use of Neurontin 500 mg at bedtime as well as oxycodone and  Ultram for breakthrough pain.  She will continue on hormone supplement for hypothyroidism after noted TSH levels being low, free T4 normal, free T3 low..  It was felt in light of new amiodarone to initiate this hormone supplement and follow-up thyroid levels as outpatient.  Noted blindness due to temporal arteritis, chronic prednisone as advised.  Bouts of constipation resolved with laxative assistance.  Noted history of asthma.   She remained on inhalers as advised with no shortness of breath.  The patient received weekly collaborative interdisciplinary team conferences to discuss estimated length of stay, family teaching, any barriers to discharge.  The patient was ambulating 150 feet rolling walker supervision needing assistance for her  blindness, working on balance training, worked on stepping up backwards on 2 step stools to get into and out of a tall bed that she had at home.  Activities of daily living and homemaking.  Perform bed mobility with supervision.  Ambulates to the  bathroom  with a rolling walker, stand pivot transfers to the shower.  She can dress her lower extremities.  Full family teaching was completed and planned discharge to home.  DISCHARGE MEDICATIONS:  Included amiodarone 200 mg p.o. b.i.d. until 04/05/2018 and then begin 200 mg daily, albuterol inhaler as needed, Eliquis 5 mg p.o. b.i.d., Os-Cal 1 tablet daily, vitamin D 3000 units daily, Colace 100 mg p.o. b.i.d., Lasix 20 mg  p.o. daily, Synthroid 25 mcg daily, Neurontin 500 mg p.o. at bedtime, Ativan 1 mg at bedtime, Lopressor 25 mg p.o. b.i.d., Dulera 2 puffs twice daily, Singulair 10 mg p.o. at bedtime, prednisone 7.5 mg p.o. daily, Ultram 50 mg every 6 hours as needed for pain.  DIET:  Regular.  She was weightbearing as tolerated.  Special instructions.  Follow-up chemistries to monitor potassium levels with recent resuming of low-dose Lasix.  Recommend obtain follow-up TSH, T3 and free T4 levels as outpatient for recent addition of Synthroid  The patient will follow up with Dr. Lyla Glassing, orthopedic services, as directed.  Cardiology services.  Call for appointment with Dr. Alger Simons as directed; Dr. Billey Gosling, medical management.  LN/NUANCE D:03/29/2018 T:03/29/2018 JOB:[id number]

## 2018-03-29 NOTE — Discharge Summary (Signed)
Discharge summary job [id number]

## 2018-03-29 NOTE — Progress Notes (Signed)
Social Work Patient ID: Crystal [name], female   DOB: August 04, 1945, 72 y.o.   MRN: [id number]  Have reviewed team conference with pt and family.  All aware and agreeable with d/c tomorrow.  Discussed f/u therapy and DME needs.  Pt pleased with progress.    Canon Gola, LCSW

## 2018-03-29 NOTE — Patient Care Conference (Cosign Needed)
Inpatient RehabilitationTeam Conference and Plan of Care Update Date: 03/28/2018   Time: 2:20 PM    Patient Name: Crystal [name]      Medical Record Number: [id number]  Date of Birth: 07/10/1945 Sex: Female         Room/Bed: 4W01C/4W01C-01 Payor Info: Payor: [place] / Plan: BCBS MEDICARE / Product Type: *No Product type* /    Admitting Diagnosis: Rt femoral next fx  Admit Date/Time:  03/24/2018  5:57 PM Admission Comments: No comment available   Primary Diagnosis:  <principal problem not specified> Principal Problem: <principal problem not specified>  Patient Active Problem List   Diagnosis Date Noted  . Femoral neck fracture ([place]) 03/24/2018  . Fracture of femoral neck, right ([place]) 03/22/2018  . Atrial fibrillation with RVR ([place]) 03/21/2018  . Atrial fibrillation ([place]) 03/18/2018  . Femoral artery thrombosis, right ([place]) 09/01/2017  . Paroxysmal atrial fibrillation ([facility])   . Herpes zoster without complication 40/98/1191  . Jaw claudication 07/27/2017  . Temporal arteritis ([place]) 07/18/2017  . Vision, loss, sudden, bilateral 07/18/2017  . 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 Parotitis 10/06/2016  . Osteoarthritis 06/25/2016  . Anxiety 06/25/2016  . Back pain 07/18/2015  . 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 Osteopenia 06/03/2008  . History of colonic polyps 06/03/2008  . INTENTION TREMOR 12/06/2007  . IBS 10/16/2007  . Hyperlipidemia 06/06/2007  . Essential hypertension 06/06/2007  . Chronic obstructive airway disease with asthma ([place]) 02/23/2007    Expected Discharge Date: Expected Discharge Date: 03/30/18  Team Members Present: Physician leading conference: Dr. 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  Pain is been an issue as well as lower extremity swelling.  Improve functional mobility and safety  Pain management, wound care, electrolyte and fluid managements.   Bowel/Bladder   continent of bowel and bladder; LBM 03/27/2018   remain continent   assess bowel and bladder needs q shift and PRN    Swallow/Nutrition/ Hydration             ADL's   supervision/steady assistance  supervision overall  d/c planning   Mobility   min guard/supervision  supervision overall 2/2 visual impairments  d/c planning   Communication             Safety/Cognition/ Behavioral Observations            Pain   c/o of right hip pain 9/10 PRN oxy given; provided relief   pain </= 2/10   assess pain q shift and PRN and medicate PRN    Skin   Rt ORIF; Bruising noted to right side; no rednees, drainage, or signs of infection noted   remain free of any further skin breakdown/infection   assess q shift, PRN    Rehab Goals Patient on target to meet rehab goals: Yes *See Care Plan and progress notes for long and short-term goals.     Barriers to Discharge  Current Status/Progress Possible Resolutions Date Resolved   Physician    Medical stability               Nursing  PT                    OT                  SLP                SW                Discharge Planning/Teaching Needs:  Plan to d/c home with spouse who can provide 24/7 supervision  Ongoing - spouse here daily   Team Discussion:  No significant medical issues.  Cont b/b.  reaching supervision goals and family ed is ongoing.  Ready for d/c Thursday  Revisions to Treatment Plan:  NA    Continued Need for Acute Rehabilitation Level of Care: The patient requires daily medical management by a physician with specialized training in physical medicine and  rehabilitation for the following conditions: Daily direction of a multidisciplinary physical rehabilitation program to ensure safe treatment while eliciting the highest outcome that is of practical value to the patient.: Yes Daily medical management of patient stability for increased activity during participation in an intensive rehabilitation regime.: Yes Daily analysis of laboratory values and/or radiology reports with any subsequent need for medication adjustment of medical intervention for : Post surgical problems;Neurological problems;Wound care problems   I attest that I was present, lead the team conference, and concur with the assessment and plan of the team.   Shiasia Porro 03/29/2018, 3:47 PM

## 2018-03-30 MED ORDER — GABAPENTIN 100 MG PO CAPS: 500.0000 mg | ORAL_CAPSULE | Freq: Every day | ORAL | 0 refills | Status: DC

## 2018-03-30 MED ORDER — LORAZEPAM 1 MG PO TABS: 1.0000 mg | ORAL_TABLET | Freq: Every day | ORAL | 2 refills | Status: DC

## 2018-03-30 MED ORDER — AMIODARONE HCL 200 MG PO TABS: 200.0000 mg | ORAL_TABLET | Freq: Every day | ORAL | 1 refills | Status: DC

## 2018-03-30 MED ORDER — AMIODARONE HCL 200 MG PO TABS: 200.0000 mg | ORAL_TABLET | Freq: Two times a day (BID) | ORAL | 0 refills | Status: DC

## 2018-03-30 MED ORDER — SACCHAROMYCES BOULARDII 250 MG PO CAPS: 250.0000 mg | ORAL_CAPSULE | Freq: Every day | ORAL | Status: DC

## 2018-03-30 MED ORDER — ACETAMINOPHEN 325 MG PO TABS: 650.0000 mg | ORAL_TABLET | ORAL | Status: DC | PRN

## 2018-03-30 MED ORDER — TRAMADOL HCL 50 MG PO TABS: 50.0000 mg | ORAL_TABLET | Freq: Four times a day (QID) | ORAL | 0 refills | Status: DC | PRN

## 2018-03-30 MED ORDER — FUROSEMIDE 20 MG PO TABS: 20.0000 mg | ORAL_TABLET | Freq: Every day | ORAL | 1 refills | Status: DC

## 2018-03-30 MED ORDER — VITAMIN D3 25 MCG (1000 UT) PO CAPS: 1000.0000 [IU] | ORAL_CAPSULE | Freq: Every day | ORAL | 0 refills | Status: DC

## 2018-03-30 MED ORDER — APIXABAN 5 MG PO TABS: 5.0000 mg | ORAL_TABLET | Freq: Two times a day (BID) | ORAL | 6 refills | Status: DC

## 2018-03-30 MED ORDER — METOPROLOL TARTRATE 25 MG PO TABS: 25.0000 mg | ORAL_TABLET | Freq: Two times a day (BID) | ORAL | 0 refills | Status: DC

## 2018-03-30 MED ORDER — LEVOTHYROXINE SODIUM 25 MCG PO TABS: 25.0000 ug | ORAL_TABLET | Freq: Every day | ORAL | 0 refills | Status: DC

## 2018-03-30 MED ORDER — MONTELUKAST SODIUM 10 MG PO TABS: 10.0000 mg | ORAL_TABLET | Freq: Every day | ORAL | 1 refills | Status: DC

## 2018-03-30 MED ORDER — AMIODARONE HCL 200 MG PO TABS: ORAL_TABLET | ORAL | 1 refills | Status: DC

## 2018-03-30 NOTE — Progress Notes (Signed)
Social Work  Discharge Note  The overall goal for the admission was met for:   Discharge location: Yes - home with spouse who can provide 24/7 supervision  Length of Stay: Yes - 6 days  Discharge activity level: Yes - supervision  Home/community participation: Yes  Services provided included: MD, RD, PT, OT, SLP, RN, TR, Pharmacy and SW  Financial Services:  [employer] Medicare  Follow-up services arranged: Outpatient: OT - low vision specialist with [facility] to resume following, DME: transport w/c, rw, 3n1 commode and tub seat via [employer] and Patient/Family request agency HH: [facility], DME: NA  Comments (or additional information):  Patient/Family verbalized understanding of follow-up arrangements: Yes  Individual responsible for coordination of the follow-up plan: pt  Confirmed correct DME delivered: Dana Dorner 03/30/2018    Galdino Hinchman

## 2018-03-30 NOTE — Progress Notes (Signed)
[facility] PHYSICAL MEDICINE & REHABILITATION PROGRESS NOTE   Subjective/Complaints: Up in bed. No new complaints. Pain controlled  ROS: Patient denies fever, rash, sore throat, blurred vision, nausea, vomiting, diarrhea, cough, shortness of breath or chest pain, joint or back pain, headache, or mood change.    Objective:   No results found. No results for input(s): WBC, HGB, HCT, PLT in the last 72 hours. Recent Labs    03/29/18 0543  NA 138  K 4.0  CL 103  CO2 25  GLUCOSE 74  BUN 14  CREATININE 0.87  CALCIUM 8.9    Intake/Output Summary (Last 24 hours) at 03/30/2018 0851 Last data filed at 03/30/2018 0700 Gross per 24 hour  Intake 540 ml  Output -  Net 540 ml     Physical Exam: Vital Signs Blood pressure 115/80, pulse 80, temperature 98.6 F (37 C), temperature source Oral, resp. rate 16, height 5\' 3"  (1.6 m), weight 59.1 kg, SpO2 92 %.   Constitutional: No distress . Vital signs reviewed. HEENT: EOMI, oral membranes moist Neck: supple Cardiovascular: RRR without murmur. No JVD    Respiratory: CTA Bilaterally without wheezes or rales. Normal effort    GI: BS +, non-tender, non-distended  Ext: no clubbing, cyanosis, or 1+ RLE edema, particularly around hip Skin: No evidence of breakdown, no evidence of rash, surgical site cdi Neurologic: Blind. Cranial nerves II through XII intact, motor strength is 5/5 in bilateral deltoid, bicep, tricep, grip, HF, knee extensors, ankle dorsiflexor and plantar flexor 2- R HF, 3- R KE  4/5 R ADF--stable Sensory exam normal sensation to light touch in bilateral upper and lower extremities  Musculoskeletal: right hip remains tender with PROM/AROM   Assessment/Plan: 1. Functional deficits secondary to R FNF which require 3+ hours per day of interdisciplinary therapy in a comprehensive inpatient rehab setting.  Physiatrist is providing close team supervision and 24 hour management of active medical problems listed  below. Physiatrist and rehab team continue to assess barriers to discharge/monitor patient progress toward functional and medical  Goals   Care Tool:  Bathing    Body parts bathed by patient: Right arm, Left arm, Chest, Abdomen, Front perineal area, Buttocks, Right upper leg, Left upper leg, Right lower leg, Left lower leg, Face         Bathing assist Assist Level: Supervision/Verbal cueing     Upper Body Dressing/Undressing Upper body dressing   What is the patient wearing?: Bra, Pull over shirt    Upper body assist Assist Level: Supervision/Verbal cueing    Lower Body Dressing/Undressing Lower body dressing      What is the patient wearing?: Underwear/pull up     Lower body assist Assist for lower body dressing: Supervision/Verbal cueing     Toileting Toileting    Toileting assist Assist for toileting: Supervision/Verbal cueing     Transfers Chair/bed transfer  Transfers assist     Chair/bed transfer assist level: Supervision/Verbal cueing     Locomotion Ambulation   Ambulation assist      Assist level: Contact Guard/Touching assist Assistive device: Walker-rolling Max distance: 247ft    Walk 10 feet activity   Assist     Assist level: Contact Guard/Touching assist Assistive device: Walker-rolling   Walk 50 feet activity   Assist    Assist level: Contact Guard/Touching assist Assistive device: Walker-rolling    Walk 150 feet activity   Assist Walk 150 feet activity did not occur: Safety/medical concerns  Assist level: Contact Guard/Touching assist Assistive device: Walker-rolling  Walk 10 feet on uneven surface  activity   Assist Walk 10 feet on uneven surfaces activity did not occur: Safety/medical concerns   Assist level: Contact Guard/Touching assist Assistive device: Aeronautical engineer Will patient use wheelchair at discharge?: No   Wheelchair activity did not occur: Safety/medical  concerns         Wheelchair 50 feet with 2 turns activity    Assist    Wheelchair 50 feet with 2 turns activity did not occur: Safety/medical concerns       Wheelchair 150 feet activity     Assist Wheelchair 150 feet activity did not occur: Safety/medical concerns           Medical Problem List and Plan: 1.  Decreased functional mobility secondary to right neck fracture.  Status post percutaneous 03/21/2018 ORIF CIR PT, OT  -dc home today  -follow up with ortho./primary     2.  DVT Prophylaxis/Anticoagulation: Chronic Eliquis 3. Pain Management: Neurontin 500 mg nightly, oxycodone and Ultram as needed 4. Mood: Provide emotional support.Ativan 1mg  QHS 5. Neuropsych: This patient is capable of making decisions on her own behalf. 6. Skin/Wound Care: wound healed 7. Fluids/Electrolytes/Nutrition:   -encourage PO 8.  Atrial fibrillation/CV.  Cardiac rate controlled.  Follow up with cardiology services as needed.  Amiodarone as directed, Lopressor 50 mg twice daily   -well controlled  -continue lasix at 20mg  daily (home dose 40mg ) to help with lower ext edem--can resume 40mg  dose at some point when follows up with primary  -I personally reviewed the patient's labs today.  normal 9.  Hypothyroidism.  Synthroid 10.  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  Continue chronic prednisone     reduced to 7.5mg  home dose 11.  Asthma.  Continue inhalers as directed 12.  Constipation.  Laxative assistance, reports BM 10/16    LOS: 6 days A FACE TO [place] 03/30/2018, 8:51 AM

## 2018-03-30 NOTE — Plan of Care (Signed)
  Problem: Consults Goal: RH GENERAL PATIENT EDUCATION Description See Patient Education module for education specifics. Outcome: Progressing Goal: Skin Care Protocol Initiated - if Braden Score 18 or less Description If consults are not indicated, leave blank or document N/A Outcome: Progressing   Problem: RH BOWEL ELIMINATION Goal: RH STG MANAGE BOWEL WITH ASSISTANCE Description STG Manage Bowel with mod I Assistance.  Outcome: Progressing   Problem: RH SKIN INTEGRITY Goal: RH STG SKIN FREE OF INFECTION/BREAKDOWN Description Patients skin will remain free from further infection or breakdown with mod I assist.  Outcome: Progressing Goal: RH STG MAINTAIN SKIN INTEGRITY WITH ASSISTANCE Description STG Maintain Skin Integrity With mod I Assistance.  Outcome: Progressing   Problem: RH SAFETY Goal: RH STG ADHERE TO SAFETY PRECAUTIONS W/ASSISTANCE/DEVICE Description STG Adhere to Safety Precautions With min Assistance/Device.  Outcome: Progressing   Problem: RH PAIN MANAGEMENT Goal: RH STG PAIN MANAGED AT OR BELOW PT'S PAIN GOAL Description < 4  Outcome: Progressing

## 2018-03-30 NOTE — Discharge Instructions (Addendum)
Inpatient Rehab Discharge Instructions  Crystal [name] Discharge date and time: No discharge date for patient encounter.   Activities/Precautions/ Functional Status: Activity: Weightbearing as tolerated right lower extremity Diet: regular diet Wound Care: keep wound clean and dry Functional status:  ___ No restrictions     ___ Walk up steps independently ___ 24/7 supervision/assistance   ___ Walk up steps with assistance ___ Intermittent supervision/assistance  ___ Bathe/dress independently ___ Walk with walker     _x__ Bathe/dress with assistance ___ Walk Independently    ___ Shower independently ___ Walk with assistance    ___ Shower with assistance ___ No alcohol     ___ Return to work/school ________     COMMUNITY REFERRALS UPON DISCHARGE:    Outpatient:     OT                   Agency:  [facility] to resume services   Medical Equipment/Items Ordered:  Transport wheelchair, rolling walker, 3n1 commode and tub seat                                                       Agency/Supplier:  [facility] @ [phone]     Special Instructions: No driving  Follow-up outpatient thyroid panels for recent addition of thyroid medication   My questions have been answered and I understand these instructions. I will adhere to these goals and the provided educational materials after my discharge from the hospital.  Patient/Caregiver Signature _______________________________ Date __________  Clinician Signature _______________________________________ Date __________  Please bring this form and your medication list with you to all your follow-up doctor's appointments. Information on my medicine - ELIQUIS (apixaban)  This medication education was reviewed with me or my healthcare representative as part of my discharge preparation.    Why was Eliquis prescribed for you? Eliquis was prescribed for you to reduce the risk of a blood clot forming that can cause a stroke  if you have a medical condition called atrial fibrillation (a type of irregular heartbeat).  What do You need to know about Eliquis ? Take your Eliquis TWICE DAILY - one tablet in the morning and one tablet in the evening with or without food. If you have difficulty swallowing the tablet whole please discuss with your pharmacist how to take the medication safely.  Take Eliquis exactly as prescribed by your doctor and DO NOT stop taking Eliquis without talking to the doctor who prescribed the medication.  Stopping may increase your risk of developing a stroke.  Refill your prescription before you run out.  After discharge, you should have regular check-up appointments with your healthcare provider that is prescribing your Eliquis.  In the future your dose may need to be changed if your kidney function or weight changes by a significant amount or as you get older.  What do you do if you miss a dose? If you miss a dose, take it as soon as you remember on the same day and resume taking twice daily.  Do not take more than one dose of ELIQUIS at the same time to make up a missed dose.  Important Safety Information A possible side effect of Eliquis is bleeding. You should call your healthcare provider right away if you experience any of the following: ? Bleeding from an injury  or your nose that does not stop. ? Unusual colored urine (red or dark brown) or unusual colored stools (red or black). ? Unusual bruising for unknown reasons. ? A serious fall or if you hit your head (even if there is no bleeding).  Some medicines may interact with Eliquis and might increase your risk of bleeding or clotting while on Eliquis. To help avoid this, consult your healthcare provider or pharmacist prior to using any new prescription or non-prescription medications, including herbals, vitamins, non-steroidal anti-inflammatory drugs (NSAIDs) and supplements.  This website has more information on Eliquis  (apixaban): http://www.eliquis.com/eliquis/home

## 2018-03-30 NOTE — Progress Notes (Signed)
Pt discharged home accompanied by NT and family. Prn pain medication given prior to discharge. Pt is stable at time of discharge.  Crystal [name]

## 2018-04-03 ENCOUNTER — Telehealth: Payer: Self-pay | Admitting: *Deleted

## 2018-04-03 NOTE — Telephone Encounter (Signed)
Pt husband called back to make appt for wife. Completed TCM call below.Crystal [name]  Transition Care Management Follow-up Telephone Call   Date discharged? 03/30/18   How have you been since you were released from the hospital? Per husband he states she's been doing ok still in a lot of pain from the surgery   Do you understand why you were in the hospital? YES   Do you understand the discharge instructions? YES   Where were you discharged to? Home   Items Reviewed:  Medications reviewed: YES  Allergies reviewed: YES  Dietary changes reviewed: YES, heart healthy  Referrals reviewed: No referral recommended   Functional Questionnaire:   Activities of Daily Living (ADLs):   She states he are independent in the following: bathing and hygiene, feeding, continence, grooming and toileting States they require assistance with the following: ambulation and dressing   Any transportation issues/concerns?: NO   Any patient concerns? NO   Confirmed importance and date/time of follow-up visits scheduled YES, appt 04/11/18  Provider Appointment booked with Dr. [name]  Confirmed with patient if condition begins to worsen call PCP or go to the ER.  Patient was given the office number and encouraged to call back with question or concerns.  : YES

## 2018-04-03 NOTE — Telephone Encounter (Signed)
Pt was on TCM report need hosp f/u appt tried calling pt no answer LMOM RTC.Marland KitchenSent CRM [facility] for [facility].Marland KitchenJohny Chess

## 2018-04-06 DIAGNOSIS — H540X55 Blindness right eye category 5, blindness left eye category 5: Secondary | ICD-10-CM | POA: Diagnosis not present

## 2018-04-10 ENCOUNTER — Encounter: Payer: Self-pay | Admitting: Physician Assistant

## 2018-04-10 ENCOUNTER — Ambulatory Visit: Payer: Medicare Other | Admitting: Physician Assistant

## 2018-04-10 ENCOUNTER — Other Ambulatory Visit: Payer: Self-pay

## 2018-04-10 VITALS — BP 104/80 | HR 88 | Ht 63.0 in | Wt 116.0 lb

## 2018-04-10 DIAGNOSIS — S72031D Displaced midcervical fracture of right femur, subsequent encounter for closed fracture with routine healing: Secondary | ICD-10-CM | POA: Diagnosis not present

## 2018-04-10 DIAGNOSIS — R600 Localized edema: Secondary | ICD-10-CM

## 2018-04-10 DIAGNOSIS — I10 Essential (primary) hypertension: Secondary | ICD-10-CM | POA: Diagnosis not present

## 2018-04-10 DIAGNOSIS — E785 Hyperlipidemia, unspecified: Secondary | ICD-10-CM

## 2018-04-10 DIAGNOSIS — I4819 Other persistent atrial fibrillation: Secondary | ICD-10-CM | POA: Diagnosis not present

## 2018-04-10 DIAGNOSIS — I739 Peripheral vascular disease, unspecified: Secondary | ICD-10-CM | POA: Diagnosis not present

## 2018-04-10 DIAGNOSIS — Z8781 Personal history of (healed) traumatic fracture: Secondary | ICD-10-CM

## 2018-04-10 DIAGNOSIS — Z5189 Encounter for other specified aftercare: Secondary | ICD-10-CM | POA: Diagnosis not present

## 2018-04-10 MED ORDER — AMIODARONE HCL 200 MG PO TABS: 200.0000 mg | ORAL_TABLET | Freq: Every day | ORAL | 1 refills | Status: DC

## 2018-04-10 MED ORDER — PRAVASTATIN SODIUM 40 MG PO TABS: ORAL_TABLET | ORAL | 0 refills | Status: DC

## 2018-04-10 NOTE — Patient Instructions (Signed)
Medication Instructions:  Decrease Amiodarone to 200 mg (1 tab) daily.  Increase Furosemide (Lasix) to 40 mg (2 tabs) for 3 days ONLY; then go back to 20 mg (1 tab) daily.   If you need a refill on your cardiac medications before your next appointment, please call your pharmacy.   Lab work: None  If you have labs (blood work) drawn today and your tests are completely normal, you will receive your results only by: Marland Kitchen MyChart Message (if you have MyChart) OR . A paper copy in the mail If you have any lab test that is abnormal or we need to change your treatment, we will call you to review the results.  Testing/Procedures: none  Follow-Up: At [facility], you and your health needs are our priority.  As part of our continuing mission to provide you with exceptional heart care, we have created designated Provider Care Teams.  These Care Teams include your primary Cardiologist (physician) and Advanced Practice Providers (APPs -  Physician Assistants and Nurse Practitioners) who all work together to provide you with the care you need, when you need it. You will need a follow up appointment in 2 weeks with Crystal Deforest, PA when Dr. Stanford Breed is also in the office.  Please schedule a follow-up appointment with Dr. Stanford Breed in 1 month.   Advanced Practice Providers on your designated Care Team:   Crystal Ransom, PA-C [place], [place] . Crystal Rives, PA-C  Any Other Special Instructions Will Be Listed Below (If Applicable). none

## 2018-04-10 NOTE — Progress Notes (Signed)
Cardiology Office Note    Date:  04/10/2018   ID:  Crystal [name], DOB Nov 02, 1945, MRN [id number]  PCP:  [name], MD  Cardiologist: Dr. Stanford Breed  Chief Complaint  Patient presents with  . Follow-up    seen for Dr. Stanford Breed.     History of Present Illness:  Crystal [name] is a 72 y.o. female with past medical history of diverticulosis, hypertension, hyperlipidemia, IBS, mitral valve prolapse, peripheral vascular disease and paroxysmal atrial fibrillation.  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  This was followed by development of shingles which was treated with Valtrex.  She had leg pain in March 2019 and found to be in atrial fibrillation during admission.  Ultrasound revealed thrombotic occlusion of her right SFA with reconstitution of popliteal artery above the knee, no ultrasound evidence of plaque.  This is consistent with emboli.  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  Postop course, her hemoglobin dropped from 12.2 down to 9.1.  Cardiology service was called to manage atrial fibrillation.  Echocardiogram in March 2019 showed normal LVEF, grade 2 DD, mild to moderate TR.  Patient converted to sinus rhythm spontaneously.  She has since been started on 5 mg twice daily of Eliquis.  I last saw her in April 2019, I increased her diltiazem to 180 mg daily to further help control the heart rate.  She saw Dr. Stanford Breed in July 2019, she was doing well at the time.  She was seen in the ED on 02/25/2018 for right buttock pain.  She was diagnosed with a subcutaneous hematoma.  This was treated conservatively.  She had a mechanical fall on 03/18/2018, she hit her head but did not lose consciousness.  Imaging on admission showed impacted subcapital fracture of the right hip.  EKG showed atrial fibrillation with RVR, heart 167.  She was placed on IV amiodarone, Eliquis transition to IV heparin in  anticipation of surgery.  He eventually underwent percutaneous screw fixation of the right femoral neck by Dr. Lyla Glassing on 03/21/2018.  She was eventually discharged on amiodarone 200 mg twice daily for 2 weeks with plan to cut back to 200 mg daily thereafter.  Digoxin was used temporarily during the admission, however discontinued prior to discharge.  While on the amiodarone, her TSH was borderline high however free T3 was a little low, T4 were normal.  Patient presents today for post hospital follow-up.  She denies any obvious chest pain or increasing shortness of breath.  However she has been having bruises upon slightest bump.  She is still on amiodarone 200 mg twice daily.  I will decrease her amiodarone to 200 mg daily.  She has been having bilateral lower extremity 2-3+ pitting edema recently.  I think this is related to IV hydration in the hospital.  I instructed her to increase Lasix to 40 mg daily for the next 3 days before decreasing it back down to 20 mg daily thereafter.  I will see the patient back in 2 weeks for reassessment.  She will need to see Dr. Stanford Breed in 4 to 6 weeks.  Once her leg recovers and that she is able to move around more freely, I plan to set her up for outpatient DC cardioversion.   Past Medical History:  Diagnosis Date  . Adenomatous colon polyp 2007 & 2010    Dr [name]  . Allergy  seasonal  . Arthritis   . Cataract   . Diverticulosis   . 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 Hyperlipidemia   . Hypertension   . IBS (irritable bowel syndrome)   . Legally blind   . MVP (mitral valve prolapse)   . Paroxysmal atrial fibrillation with rapid ventricular response ([place]) 08/29/2017  . Peripheral vascular disease ([place])   . RAD (reactive airway disease)     Past Surgical History:  Procedure Laterality Date  . ABDOMINAL HYSTERECTOMY     with USO for dysfunctionall menses  . APPENDECTOMY    . ARTERY BIOPSY Bilateral 07/21/2017   Procedure: BIOPSY  BILATERAL TEMPORAL ARTERY;  Surgeon: [name], MD;  Location: [facility];  Service: Vascular;  Laterality: Bilateral;  . CATARACT EXTRACTION W/ INTRAOCULAR LENS IMPLANT  2013   bilateral; Dr [name]  . COLONOSCOPY  2013   negative , due 2018; Dr [name]  . COLONOSCOPY W/ POLYPECTOMY      X2 ; diverticulosis  . EMBOLECTOMY Right 08/29/2017   Procedure: Right Groin Exploration  ;  Surgeon: Rosetta Posner, MD;  Location: [facility];  Service: Vascular;  Laterality: Right;  . HIP PINNING,CANNULATED Right 03/21/2018  . HIP PINNING,CANNULATED Right 03/21/2018   Procedure: CANNULATED HIP PINNING;  Surgeon: Rod Can, MD;  Location: [facility];  Service: Orthopedics;  Laterality: Right;  . LUMBAR LAMINECTOMY  2000   Dr Vertell Limber  . SHOULDER SURGERY     R shoulder  . TONSILLECTOMY      Current Medications: Outpatient Medications Prior to Visit  Medication Sig Dispense Refill  . acetaminophen (TYLENOL) 325 MG tablet Take 2 tablets (650 mg total) by mouth every 4 (four) hours as needed for mild pain, fever or headache.    . albuterol (PROVENTIL HFA;VENTOLIN HFA) 108 (90 Base) MCG/ACT inhaler INHALE 1 PUFF INTO THE LUNGS AS DIRECTED IF NEEDED 8.5 each 0  . apixaban (ELIQUIS) 5 MG TABS tablet Take 1 tablet (5 mg total) by mouth 2 (two) times daily. 60 tablet 6  . Calcium Carbonate-Vitamin D (CALCIUM 600/VITAMIN D PO) Take 1 tablet by mouth daily.     . Cholecalciferol (VITAMIN D3) 1000 units CAPS Take 1 capsule (1,000 Units total) by mouth daily with lunch. 60 capsule 0  . furosemide (LASIX) 20 MG tablet Take 1 tablet (20 mg total) by mouth daily. 30 tablet 1  . gabapentin (NEURONTIN) 100 MG capsule Take 5 capsules (500 mg total) by mouth at bedtime. 30 capsule 0  . levothyroxine (SYNTHROID, LEVOTHROID) 25 MCG tablet Take 1 tablet (25 mcg total) by mouth daily before breakfast. 30 tablet 0  . LORazepam (ATIVAN) 1 MG tablet Take 1 tablet (1 mg total) by mouth at bedtime. 30 tablet 2  . metoprolol  tartrate (LOPRESSOR) 25 MG tablet Take 1 tablet (25 mg total) by mouth 2 (two) times daily. 60 tablet 0  . montelukast (SINGULAIR) 10 MG tablet Take 1 tablet (10 mg total) by mouth daily. 90 tablet 1  . pravastatin (PRAVACHOL) 40 MG tablet TAKE 1 TABLET DAILY BY MOUTH AT BEDTIME (Patient taking differently: Take 40 mg by mouth at bedtime. ) 90 tablet 1  . predniSONE (DELTASONE) 5 MG tablet Take 7.5 mg by mouth daily with breakfast.    . saccharomyces boulardii (FLORASTOR) 250 MG capsule Take 1 capsule (250 mg total) by mouth daily.    Marland Kitchen senna (SENOKOT) 8.6 MG TABS tablet Take 1 tablet (8.6 mg total) by mouth 2 (two) times daily. [place]  each 0  . SYMBICORT 160-4.5 MCG/ACT inhaler INHALE 1 OR 2 PUFFS INTO THE LUNGS EVERY 12 HOURS. THEN GARGLE AND SPIT AFTER USE (Patient taking differently: Inhale 2 puffs into the lungs 2 (two) times daily. ) 6 g 10  . traMADol (ULTRAM) 50 MG tablet Take 1 tablet (50 mg total) by mouth every 6 (six) hours as needed for moderate pain. 60 tablet 0  . amiodarone (PACERONE) 200 MG tablet Take 1 tablet (200 mg total) by mouth 2 (two) times daily. 12 tablet 0  . amiodarone (PACERONE) 200 MG tablet Take 1 tablet (200 mg total) by mouth daily. 30 tablet 1  . amiodarone (PACERONE) 200 MG tablet Begin 1 tablet daily 04/05/2018 30 tablet 1  . levothyroxine (SYNTHROID, LEVOTHROID) 25 MCG tablet Take 1 tablet (25 mcg total) by mouth daily before breakfast. 30 tablet 0   No facility-administered medications prior to visit.      Allergies:   Simvastatin   Social History   Socioeconomic History  . Marital status: Married    Spouse name: Not on file  . Number of children: Not on file  . Years of education: Not on file  . Highest education level: Not on file  Occupational History  . Occupation: retired  Scientific laboratory technician  . Financial resource strain: Not on file  . Food insecurity:    Worry: Not on file    Inability: Not on file  . Transportation needs:    Medical: Not on file      Non-medical: Not on file  Tobacco Use  . Smoking status: Former Smoker    Packs/day: 1.00    Years: 23.00    Pack years: 23.00    Last attempt to quit: 06/14/1989    Years since quitting: 28.8  . Smokeless tobacco: Never Used  . Tobacco comment: smoked 1961-1991 , up to < 1 ppd  Substance and Sexual Activity  . Alcohol use: Yes    Alcohol/week: 14.0 standard drinks    Types: 14 Glasses of wine per week    Comment: Wine  . Drug use: No  . Sexual activity: Not on file  Lifestyle  . Physical activity:    Days per week: Not on file    Minutes per session: Not on file  . Stress: Not on file  Relationships  . Social connections:    Talks on phone: Not on file    Gets together: Not on file    Attends religious service: Not on file    Active member of club or organization: Not on file    Attends meetings of clubs or organizations: Not on file    Relationship status: Not on file  Other Topics Concern  . Not on file  Social History Narrative  . Not on file     Family History:  The patient's family history includes COPD in her sister; Diabetes in her sister; Heart attack in her father; Heart attack (age of onset: 19) in her brother; Other in her son; Parkinsonism in her mother; Stroke in her mother.   ROS:   Please see the history of present illness.    ROS All other systems reviewed and are negative.   PHYSICAL EXAM:   VS:  BP 104/80   Pulse 88   Ht 5\' 3"  (1.6 m)   Wt 116 lb (52.6 kg)   BMI 20.55 kg/m    GEN: Well nourished, well developed, in no acute distress  HEENT: normal  Neck: no JVD,  carotid bruits, or masses Cardiac: Irregular; no murmurs, rubs, or gallops. 2+ pitting edema  Respiratory:  clear to auscultation bilaterally, normal work of breathing GI: soft, nontender, nondistended, + BS MS: no deformity or atrophy  Skin: warm and dry, no rash Neuro:  Alert and Oriented x 3, Strength and sensation are intact Psych: euthymic mood, full affect  Wt Readings  from Last 3 Encounters:  04/10/18 116 lb (52.6 kg)  03/29/18 130 lb 4.7 oz (59.1 kg)  03/21/18 110 lb 3.7 oz (50 kg)      Studies/Labs Reviewed:   EKG:  EKG is ordered today.  The ekg ordered today demonstrates atrial fibrillation, heart rate 88.  Recent Labs: 03/18/2018: B Natriuretic Peptide 534.0 03/21/2018: TSH 5.142 03/22/2018: Magnesium 2.2 03/27/2018: ALT 122; Hemoglobin 11.8; Platelets 386 03/29/2018: BUN 14; Creatinine, Ser 0.87; Potassium 4.0; Sodium 138   Lipid Panel    Component Value Date/Time   CHOL 162 06/27/2017 0930   CHOL 281 (H) 06/19/2014 1016   TRIG 95.0 06/27/2017 0930   TRIG 58 06/19/2014 1016   TRIG 51 06/01/2006 0950   HDL 63.80 06/27/2017 0930   HDL 153 06/19/2014 1016   CHOLHDL 3 06/27/2017 0930   VLDL 19.0 06/27/2017 0930   LDLCALC 79 06/27/2017 0930   LDLCALC 116 (H) 06/19/2014 1016   LDLDIRECT 116.6 06/13/2013 0921    Additional studies/ records that were reviewed today include:   Echo 08/31/2017 LV EF: 60% -   65% Study Conclusions  - Left ventricle: The cavity size was normal. Wall thickness was   normal. Systolic function was normal. The estimated ejection   fraction was in the range of 60% to 65%. Wall motion was normal;   there were no regional wall motion abnormalities. Features are   consistent with a pseudonormal left ventricular filling pattern,   with concomitant abnormal relaxation and increased filling   pressure (grade 2 diastolic dysfunction). - Left atrium: The atrium was moderately dilated. - Tricuspid valve: There was mild-moderate regurgitation. - Pulmonary arteries: Systolic pressure was moderately increased.   PA peak pressure: 47 mm Hg (S).    ASSESSMENT:    1. Persistent atrial fibrillation   2. Essential hypertension   3. Hyperlipidemia, unspecified hyperlipidemia type   4. PAD (peripheral artery disease) ([place])   5. Bilateral leg edema   6. S/P right hip fracture      PLAN:  In order of problems  listed above:  1. Persistent atrial fibrillation: Recurred after recent hip fracture.  Reduce amiodarone to 200 mg daily.  Continue on Eliquis 5 mg twice daily.  Plan to bring the patient back in a few weeks after her hip fracture improves and consider outpatient DC cardioversion  2. Thrombotic occlusion of lower extremity: Occurred in March.  Felt to be embolic related to atrial fibrillation  3. Bilateral lower extremity edema: She is volume overloaded likely related to IV hydration in the hospital.  I instructed her to increase Lasix for 3 days before going back to 20 mg daily thereafter.   4. Hypertension: Blood pressure stable  5. Hyperlipidemia: On Pravachol 40 mg daily.  6. Right hip fracture: Underwent repair recently with intramedullary nailing.  Begin to bear weight recently.  She is recovering quite well.    Medication Adjustments/Labs and Tests Ordered: Current medicines are reviewed at length with the patient today.  Concerns regarding medicines are outlined above.  Medication changes, Labs and Tests ordered today are listed in the Patient Instructions below. Patient Instructions  Medication Instructions:  Decrease Amiodarone to 200 mg (1 tab) daily.  Increase Furosemide (Lasix) to 40 mg (2 tabs) for 3 days ONLY; then go back to 20 mg (1 tab) daily.   If you need a refill on your cardiac medications before your next appointment, please call your pharmacy.   Lab work: None  If you have labs (blood work) drawn today and your tests are completely normal, you will receive your results only by: Marland Kitchen MyChart Message (if you have MyChart) OR . A paper copy in the mail If you have any lab test that is abnormal or we need to change your treatment, we will call you to review the results.  Testing/Procedures: none  Follow-Up: At [facility], you and your health needs are our priority.  As part of our continuing mission to provide you with exceptional heart care, we have  created designated Provider Care Teams.  These Care Teams include your primary Cardiologist (physician) and Advanced Practice Providers (APPs -  Physician Assistants and Nurse Practitioners) who all work together to provide you with the care you need, when you need it. You will need a follow up appointment in 2 weeks with Almyra Deforest, PA when Dr. Stanford Breed is also in the office.  Please schedule a follow-up appointment with Dr. Stanford Breed in 1 month.   Advanced Practice Providers on your designated Care Team:   [name], PA-C [place], [place] . Sande Rives, PA-C  Any Other Special Instructions Will Be Listed Below (If Applicable). none      Hilbert Corrigan, [place]  04/10/2018 12:53 PM    [facility] [place], [place], [place]  [address] Phone: [phone]; Fax: [phone]

## 2018-04-11 ENCOUNTER — Other Ambulatory Visit (INDEPENDENT_AMBULATORY_CARE_PROVIDER_SITE_OTHER): Payer: Medicare Other

## 2018-04-11 ENCOUNTER — Encounter: Payer: Self-pay | Admitting: Internal Medicine

## 2018-04-11 ENCOUNTER — Telehealth: Payer: Self-pay

## 2018-04-11 ENCOUNTER — Ambulatory Visit: Payer: Medicare Other | Admitting: Internal Medicine

## 2018-04-11 VITALS — BP 122/72 | HR 88 | Temp 97.5°F | Resp 16 | Ht 63.0 in | Wt 116.8 lb

## 2018-04-11 DIAGNOSIS — S72001A Fracture of unspecified part of neck of right femur, initial encounter for closed fracture: Secondary | ICD-10-CM

## 2018-04-11 DIAGNOSIS — I10 Essential (primary) hypertension: Secondary | ICD-10-CM | POA: Diagnosis not present

## 2018-04-11 DIAGNOSIS — D72829 Elevated white blood cell count, unspecified: Secondary | ICD-10-CM

## 2018-04-11 DIAGNOSIS — I4819 Other persistent atrial fibrillation: Secondary | ICD-10-CM

## 2018-04-11 DIAGNOSIS — M316 Other giant cell arteritis: Secondary | ICD-10-CM

## 2018-04-11 DIAGNOSIS — E039 Hypothyroidism, unspecified: Secondary | ICD-10-CM | POA: Diagnosis not present

## 2018-04-11 DIAGNOSIS — E038 Other specified hypothyroidism: Secondary | ICD-10-CM

## 2018-04-11 DIAGNOSIS — M8000XS Age-related osteoporosis with current pathological fracture, unspecified site, sequela: Secondary | ICD-10-CM

## 2018-04-11 LAB — COMPREHENSIVE METABOLIC PANEL
ALT: 13 U/L (ref 0–35)
AST: 12 U/L (ref 0–37)
Albumin: 4.7 g/dL (ref 3.5–5.2)
Alkaline Phosphatase: 89 U/L (ref 39–117)
BUN: 22 mg/dL (ref 6–23)
CO2: 31 mEq/L (ref 19–32)
Calcium: 10 mg/dL (ref 8.4–10.5)
Chloride: 95 mEq/L — ABNORMAL LOW (ref 96–112)
Creatinine, Ser: 1.33 mg/dL — ABNORMAL HIGH (ref 0.40–1.20)
GFR: 41.43 mL/min — ABNORMAL LOW (ref >60.00)
Glucose, Bld: 85 mg/dL (ref 70–99)
Potassium: 4 mEq/L (ref 3.5–5.1)
Sodium: 137 mEq/L (ref 135–145)
Total Bilirubin: 0.9 mg/dL (ref 0.2–1.2)
Total Protein: 7.4 g/dL (ref 6.0–8.3)

## 2018-04-11 LAB — CBC WITH DIFFERENTIAL/PLATELET
Basophils Absolute: 0.1 10*3/uL (ref 0.0–0.1)
Basophils Relative: 0.6 % (ref 0.0–3.0)
Eosinophils Absolute: 0.2 10*3/uL (ref 0.0–0.7)
Eosinophils Relative: 1.4 % (ref 0.0–5.0)
HCT: 45.5 % (ref 36.0–46.0)
Hemoglobin: 15.3 g/dL — ABNORMAL HIGH (ref 12.0–15.0)
Lymphocytes Relative: 5.8 % — ABNORMAL LOW (ref 12.0–46.0)
Lymphs Abs: 1.1 10*3/uL (ref 0.7–4.0)
MCHC: 33.5 g/dL (ref 30.0–36.0)
MCV: 106.7 fl — ABNORMAL HIGH (ref 78.0–100.0)
Monocytes Absolute: 1.9 10*3/uL — ABNORMAL HIGH (ref 0.1–1.0)
Monocytes Relative: 10.4 % (ref 3.0–12.0)
Neutro Abs: 15 10*3/uL — ABNORMAL HIGH (ref 1.4–7.7)
Neutrophils Relative %: 81.8 % — ABNORMAL HIGH (ref 43.0–77.0)
Platelets: 470 10*3/uL — ABNORMAL HIGH (ref 150.0–400.0)
RBC: 4.26 Mil/uL (ref 3.87–5.11)
RDW: 16.8 % — ABNORMAL HIGH (ref 11.5–15.5)
WBC: 18.3 10*3/uL (ref 4.0–10.5)

## 2018-04-11 LAB — T3, FREE: T3, Free: 3.2 pg/mL (ref 2.3–4.2)

## 2018-04-11 LAB — TSH: TSH: 10.57 u[IU]/mL — ABNORMAL HIGH (ref 0.35–4.50)

## 2018-04-11 LAB — T4, FREE: Free T4: 1 ng/dL (ref 0.60–1.60)

## 2018-04-11 NOTE — Assessment & Plan Note (Signed)
On prednisone 7.5 mg daily Following with rheumatology

## 2018-04-11 NOTE — Assessment & Plan Note (Signed)
On prolia via rheumatology

## 2018-04-11 NOTE — Telephone Encounter (Signed)
Received call from Santiago Glad in the lab for critical value of 18.3 WBC. Please advise.

## 2018-04-11 NOTE — Progress Notes (Signed)
Subjective:    Patient ID: Crystal [name], female    DOB: [birth year], 72 y.o.   MRN: [id number]  HPI The patient is here for follow up from the hospital.  ED 02/25/18  - orthopedics recommended ED evaluation for soreness in her right buttock region and leg swelling.  She had a hematoma of the right buttocks.    Hospital 03/18/18 - 03/24/18:  Rehab after hospital and discharged 03/30/18.  fell and suffered a right hip fracture.  She fell at home and was found to have an impacted right femoral neck fx by Ct scan.  She has had a couple of falls at home.  He did hit her head but there was no LOC.  She was able to ambulate after falling, but could not lift her leg or stand.  Her EKG on arrival showed SVT at a rate of 170.  She had not taken her cardizem or eliquis that morning. Cardiology and ortho was consulted.  Initially she received fentanyl but her BP dropped so she was given percocet later.  She received metoprolol.  She was slightly hypoxic and BNP was elevated to 500.  Her BP was low so no diuretic was given.  She was started on amiodarone.    Afib with RVR: Started on amiodarone and digoxin HR improved Digoxin stopped and transitioned to oral amiodarone and metoprolol eliquis continued Diltiazem and theophylline stopped She denies palpitations, chest pain.  She saw cardiology yesterday and has f/u in a few weeks  -  They will consider cardioversion  Subclinical hypothyroidism: Tsh low, free T4 normal, Free T3 low - was just started on amiodarone Levothyroxine started per cardiology - 25 mcg daily, which she is not taking - wanted to see if she really needs it Recheck tfts today  Right hip fracture: S/p  ORIF 03/21/18 She is doing well overall.  She feels her hip is doing well.  She is doing some PT and the surgeon is happy with her progress.   dexa up to date by rheum this year On fosamax through rheumatology  Hyperkalemia: Mild, in setting of holding lasix On increased lasix since  yesterday per cardio cmp  Hypertension: Restarted home lasix on discharge On metoprolol She denies any chest pain or palpitations.  Giant cell arteritis: Given stress dose steroids, returned to 7.5 mg daily as discharge Has follow up with rheumatology  Leg edema: She saw cardiology yesterday and her lasix dose was increased - the swelling has improved already Wears compression socks cmp   Medications and allergies reviewed with patient and updated if appropriate.  Patient Active Problem List   Diagnosis Date Noted  . Subclinical hypothyroidism 04/11/2018  . Fracture of femoral neck, right ([place]) 03/22/2018  . Atrial fibrillation with RVR ([place]) 03/21/2018  . Atrial fibrillation ([place]) 03/18/2018  . 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 Paroxysmal atrial fibrillation ([facility])   . Herpes zoster without complication 49/70/2637  . Jaw claudication 07/27/2017  . Temporal arteritis ([place]) 07/18/2017  . Vision, loss, sudden, bilateral 07/18/2017  . 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 Osteoarthritis 06/25/2016  . Anxiety 06/25/2016  . Back pain 07/18/2015  . 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 Osteopenia 06/03/2008  . History of colonic polyps 06/03/2008  . INTENTION TREMOR 12/06/2007  . IBS 10/16/2007  . Hyperlipidemia 06/06/2007  . Essential hypertension 06/06/2007  . Chronic obstructive airway disease with asthma ([place]) 02/23/2007    Current Outpatient Medications on File Prior  to Visit  Medication Sig Dispense Refill  . acetaminophen (TYLENOL) 325 MG tablet Take 2 tablets (650 mg total) by mouth every 4 (four) hours as needed for mild pain, fever or headache.    . albuterol (PROVENTIL HFA;VENTOLIN HFA) 108 (90 Base) MCG/ACT inhaler INHALE 1 PUFF INTO THE LUNGS AS DIRECTED IF NEEDED 8.5 each 0  . amiodarone (PACERONE) 200 MG tablet Take 1 tablet (200 mg total) by mouth daily. 30 tablet 1  . apixaban (ELIQUIS) 5 MG TABS tablet Take 1  tablet (5 mg total) by mouth 2 (two) times daily. 60 tablet 6  . Calcium Carbonate-Vitamin D (CALCIUM 600/VITAMIN D PO) Take 1 tablet by mouth daily.     . Cholecalciferol (VITAMIN D3) 1000 units CAPS Take 1 capsule (1,000 Units total) by mouth daily with lunch. 60 capsule 0  . furosemide (LASIX) 20 MG tablet Take 1 tablet (20 mg total) by mouth daily. 30 tablet 1  . gabapentin (NEURONTIN) 100 MG capsule Take 5 capsules (500 mg total) by mouth at bedtime. 30 capsule 0  . levothyroxine (SYNTHROID, LEVOTHROID) 25 MCG tablet Take 1 tablet (25 mcg total) by mouth daily before breakfast. 30 tablet 0  . LORazepam (ATIVAN) 1 MG tablet Take 1 tablet (1 mg total) by mouth at bedtime. 30 tablet 2  . metoprolol tartrate (LOPRESSOR) 25 MG tablet Take 1 tablet (25 mg total) by mouth 2 (two) times daily. 60 tablet 0  . montelukast (SINGULAIR) 10 MG tablet Take 1 tablet (10 mg total) by mouth daily. 90 tablet 1  . pravastatin (PRAVACHOL) 40 MG tablet TAKE 1 TABLET DAILY BY MOUTH AT BEDTIME 90 tablet 0  . predniSONE (DELTASONE) 5 MG tablet Take 7.5 mg by mouth daily with breakfast.    . saccharomyces boulardii (FLORASTOR) 250 MG capsule Take 1 capsule (250 mg total) by mouth daily.    Marland Kitchen senna (SENOKOT) 8.6 MG TABS tablet Take 1 tablet (8.6 mg total) by mouth 2 (two) times daily. 120 each 0  . SYMBICORT 160-4.5 MCG/ACT inhaler INHALE 1 OR 2 PUFFS INTO THE LUNGS EVERY 12 HOURS. THEN GARGLE AND SPIT AFTER USE (Patient taking differently: Inhale 2 puffs into the lungs 2 (two) times daily. ) 6 g 10  . traMADol (ULTRAM) 50 MG tablet Take 1 tablet (50 mg total) by mouth every 6 (six) hours as needed for moderate pain. 60 tablet 0   No current facility-administered medications on file prior to visit.     Past Medical History:  Diagnosis Date  . Adenomatous colon polyp 2007 & 2010    Dr [name]  . Allergy    seasonal  . Arthritis   . Cataract   . Diverticulosis   . 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 Hyperlipidemia   . Hypertension   . IBS (irritable bowel syndrome)   . Legally blind   . MVP (mitral valve prolapse)   . Paroxysmal atrial fibrillation with rapid ventricular response ([place]) 08/29/2017  . Peripheral vascular disease ([place])   . RAD (reactive airway disease)     Past Surgical History:  Procedure Laterality Date  . ABDOMINAL HYSTERECTOMY     with USO for dysfunctionall menses  . APPENDECTOMY    . ARTERY BIOPSY Bilateral 07/21/2017   Procedure: BIOPSY BILATERAL TEMPORAL ARTERY;  Surgeon: [name], MD;  Location: [facility];  Service: Vascular;  Laterality: Bilateral;  . CATARACT EXTRACTION W/ INTRAOCULAR LENS IMPLANT  2013   bilateral; Dr [name]  . COLONOSCOPY  2013   negative , due 2018; Dr [name]  . COLONOSCOPY W/ POLYPECTOMY      X2 ; diverticulosis  . EMBOLECTOMY Right 08/29/2017   Procedure: Right Groin Exploration  ;  Surgeon: Rosetta Posner, MD;  Location: [facility];  Service: Vascular;  Laterality: Right;  . HIP PINNING,CANNULATED Right 03/21/2018  . HIP PINNING,CANNULATED Right 03/21/2018   Procedure: CANNULATED HIP PINNING;  Surgeon: Rod Can, MD;  Location: [facility];  Service: Orthopedics;  Laterality: Right;  . LUMBAR LAMINECTOMY  2000   Dr Vertell Limber  . SHOULDER SURGERY     R shoulder  . TONSILLECTOMY      Social History   Socioeconomic History  . Marital status: Married    Spouse name: Not on file  . Number of children: Not on file  . Years of education: Not on file  . Highest education level: Not on file  Occupational History  . Occupation: retired  Scientific laboratory technician  . Financial resource strain: Not on file  . Food insecurity:    Worry: Not on file    Inability: Not on file  . Transportation needs:    Medical: Not on file    Non-medical: Not on file  Tobacco Use  . Smoking status: Former Smoker    Packs/day: 1.00    Years: 23.00    Pack years: 23.00    Last attempt to quit: 06/14/1989    Years since quitting: 28.8  . Smokeless tobacco:  Never Used  . Tobacco comment: smoked 1961-1991 , up to < 1 ppd  Substance and Sexual Activity  . Alcohol use: Yes    Alcohol/week: 14.0 standard drinks    Types: 14 Glasses of wine per week    Comment: Wine  . Drug use: No  . Sexual activity: Not on file  Lifestyle  . Physical activity:    Days per week: Not on file    Minutes per session: Not on file  . Stress: Not on file  Relationships  . Social connections:    Talks on phone: Not on file    Gets together: Not on file    Attends religious service: Not on file    Active member of club or organization: Not on file    Attends meetings of clubs or organizations: Not on file    Relationship status: Not on file  Other Topics Concern  . Not on file  Social History Narrative  . Not on file    Family History  Problem Relation Age of Onset  . Heart attack Brother 26  . Parkinsonism Mother   . Stroke Mother   . Heart attack Father        pre 41  . Diabetes Sister   . COPD Sister        X 3  . Other Son        suicide 2016  . Colon cancer Neg Hx   . Cancer Neg Hx   . Esophageal cancer Neg Hx   . Liver cancer Neg Hx   . Pancreatic cancer Neg Hx   . Rectal cancer Neg Hx   . Stomach cancer Neg Hx     Review of Systems  Constitutional: Negative for chills and fever.  Respiratory: Positive for shortness of breath (at times). Negative for cough and wheezing.   Cardiovascular: Positive for leg swelling. Negative for chest pain and palpitations.  Neurological: Positive for light-headedness (occasional). Negative for headaches.  Psychiatric/Behavioral: Positive for sleep disturbance (good).  Objective:   Vitals:   04/11/18 1005  BP: 122/72  Pulse: 88  Resp: 16  Temp: (!) 97.5 F (36.4 C)  SpO2: 99%   BP Readings from Last 3 Encounters:  04/11/18 122/72  04/10/18 104/80  03/30/18 115/80   Wt Readings from Last 3 Encounters:  04/11/18 116 lb 12.8 oz (53 kg)  04/10/18 116 lb (52.6 kg)  03/29/18 130 lb 4.7  oz (59.1 kg)   Body mass index is 20.69 kg/m.   Physical Exam    Constitutional: Appears well-developed and well-nourished. No distress.  HENT:  Head: Normocephalic and atraumatic.  Neck: Neck supple. No tracheal deviation present. No thyromegaly present.  No cervical lymphadenopathy Cardiovascular: Normal rate, irregular rhythm and normal heart sounds.   No murmur heard.   2+ b/l LE pitting edema RLE > LLE Pulmonary/Chest: Effort normal and breath sounds normal. No respiratory distress. No has no wheezes. No rales.  Skin: Skin is warm and dry. Not diaphoretic. A few small bruises and healed wounds on LE's b/l Psychiatric: Normal mood and affect. Behavior is normal.   Pelvis Portable CLINICAL DATA:  72 year old female with hip fracture. Subsequent encounter.  EXAM: PORTABLE PELVIS 1-2 VIEWS  COMPARISON:  03/21/2018 intraoperative films.  FINDINGS: Three pins traverse right subcapital fracture. Pins appear in appropriate position on this frontal projection.  IMPRESSION: Post pinning right femoral neck fracture.  Electronically Signed   By: Genia Del M.D.   On: 03/21/2018 16:27 DG C-Arm 1-60 Min CLINICAL DATA:  Surgical internal fixation of right hip fracture.  EXAM: OPERATIVE right HIP (WITH PELVIS IF PERFORMED) 3 VIEWS  TECHNIQUE: Fluoroscopic spot image(s) were submitted for interpretation post-operatively.  FLUOROSCOPY TIME:  1 minutes 34 seconds.  COMPARISON:  Radiographs of March 18, 2018.  FINDINGS: Three intraoperative fluoroscopic images of the right hip demonstrate the patient be status post surgical internal fixation of proximal right femoral neck fracture. Good alignment of fracture components is noted.  IMPRESSION: Status post surgical internal fixation of proximal right femoral neck fracture.  Electronically Signed   By: Marijo Conception, M.D.   On: 03/21/2018 14:46 DG HIP OPERATIVE UNILAT W OR W/O PELVIS RIGHT CLINICAL DATA:  Surgical  internal fixation of right hip fracture.  EXAM: OPERATIVE right HIP (WITH PELVIS IF PERFORMED) 3 VIEWS  TECHNIQUE: Fluoroscopic spot image(s) were submitted for interpretation post-operatively.  FLUOROSCOPY TIME:  1 minutes 34 seconds.  COMPARISON:  Radiographs of March 18, 2018.  FINDINGS: Three intraoperative fluoroscopic images of the right hip demonstrate the patient be status post surgical internal fixation of proximal right femoral neck fracture. Good alignment of fracture components is noted.  IMPRESSION: Status post surgical internal fixation of proximal right femoral neck fracture.  Electronically Signed   By: Marijo Conception, M.D.   On: 03/21/2018 14:46  Lab Results  Component Value Date   WBC 7.6 03/27/2018   HGB 11.8 (L) 03/27/2018   HCT 39.0 03/27/2018   PLT 386 03/27/2018   GLUCOSE 74 03/29/2018   CHOL 162 06/27/2017   TRIG 95.0 06/27/2017   HDL 63.80 06/27/2017   LDLDIRECT 116.6 06/13/2013   LDLCALC 79 06/27/2017   ALT 122 (H) 03/27/2018   AST 44 (H) 03/27/2018   NA 138 03/29/2018   K 4.0 03/29/2018   CL 103 03/29/2018   CREATININE 0.87 03/29/2018   BUN 14 03/29/2018   CO2 25 03/29/2018   TSH 5.142 (H) 03/21/2018   INR 0.91 03/18/2018     Assessment & Plan:  See Problem List for Assessment and Plan of chronic medical problems.

## 2018-04-11 NOTE — Assessment & Plan Note (Signed)
BP well controlled Current regimen effective and well tolerated Continue current medications at current doses cmp  

## 2018-04-11 NOTE — Telephone Encounter (Signed)
Pts husband aware of results. She has not had any sxs of infection at all.

## 2018-04-11 NOTE — Assessment & Plan Note (Addendum)
On fosamax by rheum dexa done by rheum summer 2019 - OP

## 2018-04-11 NOTE — Telephone Encounter (Signed)
Husband is aware.

## 2018-04-11 NOTE — Assessment & Plan Note (Signed)
Following with cardiology Placed on amiodarone and on metoprolol, Eliquis Persistent Afib Cardio may do cardioversion  Asymptomatic Cbc, cmp

## 2018-04-11 NOTE — Patient Instructions (Addendum)
  Tests ordered today. Your results will be released to MyChart (or called to you) after review, usually within 72hours after test completion. If any changes need to be made, you will be notified at that same time.   Medications reviewed and updated.  Changes include :   None     Please followup in 6 months   

## 2018-04-11 NOTE — Assessment & Plan Note (Signed)
Has not started levothyroxine - wants to know if she really needs it Recheck tfts Will start levothyroxine if needed - if she does not need it now will need to monitor closely since on amdiodarone

## 2018-04-11 NOTE — Telephone Encounter (Signed)
Needs to come in to lab for chest x-ray and urine.  Tomorrow if possible - I am concerned she may have an infection.  Repeat cbc tomorrow  Ordered.

## 2018-04-11 NOTE — Telephone Encounter (Signed)
Call husband's cell phone-[phone]  Thyroid function is still under active and she needs to start the levothyroxine 25 mcg daily.  Kidney function is slightly decreased because of the increased Lasix dose.  Liver tests are normal.  Her white blood cell count is elevated, which does indicate a possible infection.  Often being on steroids can do this, but there is been no change in her steroids since being in the hospital and on the 14th her white blood cell count was normal.  Please confirm with her there are no symptoms of infection-change in urination, cough, wheeze or shortness of breath.  Please let me know.  She will need repeat blood work, but I would like to know the above prior to ordering it.

## 2018-04-12 ENCOUNTER — Other Ambulatory Visit (INDEPENDENT_AMBULATORY_CARE_PROVIDER_SITE_OTHER): Payer: Medicare Other

## 2018-04-12 DIAGNOSIS — H540X55 Blindness right eye category 5, blindness left eye category 5: Secondary | ICD-10-CM | POA: Diagnosis not present

## 2018-04-12 DIAGNOSIS — D72829 Elevated white blood cell count, unspecified: Secondary | ICD-10-CM

## 2018-04-12 LAB — CBC WITH DIFFERENTIAL/PLATELET
Basophils Absolute: 0.1 10*3/uL (ref 0.0–0.1)
Basophils Relative: 0.6 % (ref 0.0–3.0)
Eosinophils Absolute: 0.2 10*3/uL (ref 0.0–0.7)
Eosinophils Relative: 2.1 % (ref 0.0–5.0)
HCT: 42.5 % (ref 36.0–46.0)
Hemoglobin: 14.3 g/dL (ref 12.0–15.0)
Lymphocytes Relative: 16.8 % (ref 12.0–46.0)
Lymphs Abs: 1.9 10*3/uL (ref 0.7–4.0)
MCHC: 33.7 g/dL (ref 30.0–36.0)
MCV: 106 fl — ABNORMAL HIGH (ref 78.0–100.0)
Monocytes Absolute: 1.3 10*3/uL — ABNORMAL HIGH (ref 0.1–1.0)
Monocytes Relative: 11.6 % (ref 3.0–12.0)
Neutro Abs: 7.9 10*3/uL — ABNORMAL HIGH (ref 1.4–7.7)
Neutrophils Relative %: 68.9 % (ref 43.0–77.0)
Platelets: 414 10*3/uL — ABNORMAL HIGH (ref 150.0–400.0)
RBC: 4.01 Mil/uL (ref 3.87–5.11)
RDW: 16.1 % — ABNORMAL HIGH (ref 11.5–15.5)
WBC: 11.4 10*3/uL — ABNORMAL HIGH (ref 4.0–10.5)

## 2018-04-12 LAB — URINALYSIS, ROUTINE W REFLEX MICROSCOPIC
Bilirubin Urine: NEGATIVE
Hgb urine dipstick: NEGATIVE
Ketones, ur: NEGATIVE
Leukocytes, UA: NEGATIVE
Nitrite: NEGATIVE
RBC / HPF: NONE SEEN (ref 0–?)
Specific Gravity, Urine: 1.01 (ref 1.000–1.030)
Total Protein, Urine: NEGATIVE
Urine Glucose: NEGATIVE
Urobilinogen, UA: 4 — AB (ref 0.0–1.0)
WBC, UA: NONE SEEN (ref 0–?)
pH: 7 (ref 5.0–8.0)

## 2018-04-13 DIAGNOSIS — H543 Unqualified visual loss, both eyes: Secondary | ICD-10-CM | POA: Diagnosis not present

## 2018-04-13 DIAGNOSIS — F419 Anxiety disorder, unspecified: Secondary | ICD-10-CM | POA: Diagnosis not present

## 2018-04-13 DIAGNOSIS — M316 Other giant cell arteritis: Secondary | ICD-10-CM | POA: Diagnosis not present

## 2018-04-13 DIAGNOSIS — Z7901 Long term (current) use of anticoagulants: Secondary | ICD-10-CM | POA: Diagnosis not present

## 2018-04-13 DIAGNOSIS — G4700 Insomnia, unspecified: Secondary | ICD-10-CM | POA: Diagnosis not present

## 2018-04-13 LAB — URINE CULTURE
MICRO NUMBER:: 91306024
SPECIMEN QUALITY:: ADEQUATE

## 2018-04-15 ENCOUNTER — Other Ambulatory Visit: Payer: Self-pay | Admitting: Internal Medicine

## 2018-04-17 ENCOUNTER — Telehealth: Payer: Self-pay | Admitting: Internal Medicine

## 2018-04-17 ENCOUNTER — Other Ambulatory Visit: Payer: Self-pay | Admitting: Internal Medicine

## 2018-04-17 NOTE — Addendum Note (Signed)
Addended by: [name] on: 04/17/2018 03:15 PM   Modules accepted: Orders

## 2018-04-17 NOTE — Telephone Encounter (Signed)
Ok to restart

## 2018-04-17 NOTE — Telephone Encounter (Signed)
When calling to inform husband he stated that she is taking the amiodarone. I gave him Dr. [name] original advise and he husband stated that wife said she will be ok.

## 2018-04-17 NOTE — Telephone Encounter (Signed)
Copied from [place] [phone]. Topic: General - Other >> Apr 17, 2018 11:04 AM Alfredia Ferguson R wrote: Patient is calling want to be started back on theophylline (UNIPHYL) 400 MG 24 hr tablet . Meds were discontinued by admitting doctor and also Dr [name].

## 2018-04-17 NOTE — Telephone Encounter (Signed)
Spoke with husband and he advised that pt is not taking amiodarone anymore. He says that she is having the same SOB sxs she was having when she was in the hospital. Please advise.

## 2018-04-17 NOTE — Telephone Encounter (Signed)
Ok to start back taking?

## 2018-04-17 NOTE — Telephone Encounter (Signed)
It can not be taken with amiodarone -- does she want to see pulmonary for other options?  What symptoms is she having?

## 2018-04-18 DIAGNOSIS — H540X55 Blindness right eye category 5, blindness left eye category 5: Secondary | ICD-10-CM | POA: Diagnosis not present

## 2018-04-25 ENCOUNTER — Encounter: Payer: Self-pay | Admitting: Physician Assistant

## 2018-04-25 ENCOUNTER — Ambulatory Visit: Payer: Medicare Other | Admitting: Physician Assistant

## 2018-04-25 VITALS — BP 94/76 | HR 109 | Ht 63.0 in | Wt 115.0 lb

## 2018-04-25 DIAGNOSIS — I5032 Chronic diastolic (congestive) heart failure: Secondary | ICD-10-CM

## 2018-04-25 DIAGNOSIS — I4892 Unspecified atrial flutter: Secondary | ICD-10-CM

## 2018-04-25 DIAGNOSIS — I480 Paroxysmal atrial fibrillation: Secondary | ICD-10-CM | POA: Diagnosis not present

## 2018-04-25 DIAGNOSIS — I10 Essential (primary) hypertension: Secondary | ICD-10-CM

## 2018-04-25 DIAGNOSIS — E785 Hyperlipidemia, unspecified: Secondary | ICD-10-CM

## 2018-04-25 NOTE — Progress Notes (Signed)
Cardiology Office Note    Date:  04/25/2018   ID:  Crystal [name], DOB [birth year], MRN [id number]  PCP:  [name], MD  Cardiologist:  Dr. Stanford Breed   Chief Complaint  Patient presents with  . Follow-up    seen for Dr. Stanford Breed, recent diuresis    History of Present Illness:  Crystal [name] is a 72 y.o. female with past medical history of diverticulosis, hypertension, hyperlipidemia, IBS, mitral valve prolapse, peripheral vascular disease and paroxysmal atrial fibrillation.  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  This was followed by development of shingles which was treated with Valtrex.  She had leg pain in March 2019 and found to be in atrial fibrillation during admission.  Ultrasound revealed thrombotic occlusion of her right SFA with reconstitution of popliteal artery above the knee, no ultrasound evidence of plaque.  This is consistent with emboli.  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  Postop course, her hemoglobin dropped from 12.2 down to 9.1.  Cardiology service was called to manage atrial fibrillation.  Echocardiogram in March 2019 showed normal LVEF, grade 2 DD, mild to moderate TR.  Patient converted to sinus rhythm spontaneously.  She has since been started on 5 mg twice daily of Eliquis.  I last saw her in April 2019, I increased her diltiazem to 180 mg daily to further help control the heart rate.  She saw Dr. Stanford Breed in July 2019, she was doing well at the time.  She was seen in the ED on 02/25/2018 for right buttock pain.  She was diagnosed with a subcutaneous hematoma.  This was treated conservatively.  She had a mechanical fall on 03/18/2018, she hit her head but did not lose consciousness.  Imaging on admission showed impacted subcapital fracture of the right hip.  EKG showed atrial fibrillation with RVR, heart 167.  She was placed on IV amiodarone, Eliquis transition to  IV heparin in anticipation of surgery.  She underwent percutaneous screw fixation of the right femoral neck by Dr. Lyla Glassing on 03/21/2018.  She was eventually discharged on amiodarone 200 mg twice daily for 2 weeks with plan to cut back to 200 mg daily thereafter.  Digoxin was used temporarily during the admission, however discontinued prior to discharge.  While on the amiodarone, her TSH was borderline high however free T3 was a little low, T4 were normal.  Patient presents today for cardiology office visit.  Her blood pressure is borderline low and her heart rate is borderline high.  EKG today shows that she is in 2-1 atrial flutter.  Apparently instead of taking 25 mg twice daily of metoprolol, she has been taking 50 mg daily of metoprolol tartrate.  I asked her to switch this to 25 mg twice daily instead.  On further review, it appears she was on 50 mg twice daily of metoprolol tartrate for rate control all the way until the last day of discharge from inpatient rehab when she was switched to lower dose of metoprolol due to borderline low blood pressure.  After I increased her diuretic for 3 days during the last office visit, her lower extremity edema has significantly improved.  She continued to have ankle edema, however I am hesitant to further diurese her with borderline low blood pressure and mildly elevated creatinine on recent lab work.  I asked her to continue on the 20 mg daily of  Lasix.  She may take extra dose of Lasix if her lower extremity edema become worse.  She will need a pulmonary function test to assess her pulmonary status after started on the amiodarone.  She will also need a repeat echocardiogram given the recent swelling and recurrent atrial fibrillation.  I fear that her ejection fraction may be down.   Past Medical History:  Diagnosis Date  . Adenomatous colon polyp 2007 & 2010    Dr [name]  . Allergy    seasonal  . Arthritis   . Cataract   . Diverticulosis   . 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 Hyperlipidemia   . Hypertension   . IBS (irritable bowel syndrome)   . Legally blind   . MVP (mitral valve prolapse)   . Paroxysmal atrial fibrillation with rapid ventricular response ([place]) 08/29/2017  . Peripheral vascular disease ([place])   . RAD (reactive airway disease)     Past Surgical History:  Procedure Laterality Date  . ABDOMINAL HYSTERECTOMY     with USO for dysfunctionall menses  . APPENDECTOMY    . ARTERY BIOPSY Bilateral 07/21/2017   Procedure: BIOPSY BILATERAL TEMPORAL ARTERY;  Surgeon: [name], MD;  Location: [facility];  Service: Vascular;  Laterality: Bilateral;  . CATARACT EXTRACTION W/ INTRAOCULAR LENS IMPLANT  2013   bilateral; Dr [name]  . COLONOSCOPY  2013   negative , due 2018; Dr [name]  . COLONOSCOPY W/ POLYPECTOMY      X2 ; diverticulosis  . EMBOLECTOMY Right 08/29/2017   Procedure: Right Groin Exploration  ;  Surgeon: Rosetta Posner, MD;  Location: [facility];  Service: Vascular;  Laterality: Right;  . HIP PINNING,CANNULATED Right 03/21/2018  . HIP PINNING,CANNULATED Right 03/21/2018   Procedure: CANNULATED HIP PINNING;  Surgeon: Rod Can, MD;  Location: [facility];  Service: Orthopedics;  Laterality: Right;  . LUMBAR LAMINECTOMY  2000   Dr Vertell Limber  . SHOULDER SURGERY     R shoulder  . TONSILLECTOMY      Current Medications: Outpatient Medications Prior to Visit  Medication Sig Dispense Refill  . acetaminophen (TYLENOL) 325 MG tablet Take 2 tablets (650 mg total) by mouth every 4 (four) hours as needed for mild pain, fever or headache.    . albuterol (PROVENTIL HFA;VENTOLIN HFA) 108 (90 Base) MCG/ACT inhaler INHALE 1 PUFF INTO THE LUNGS AS DIRECTED IF NEEDED 8.5 each 0  . alendronate (FOSAMAX) 70 MG tablet alendronate 70 mg tablet   70 mg by oral route.    Marland Kitchen amiodarone (PACERONE) 200 MG tablet Take 200 mg by mouth daily.    Marland Kitchen apixaban (ELIQUIS) 5 MG TABS tablet Take 1 tablet (5 mg total) by mouth 2 (two) times  daily. 60 tablet 6  . Calcium Carbonate-Vitamin D (CALCIUM 600/VITAMIN D PO) Take 1 tablet by mouth daily.     . Cholecalciferol (VITAMIN D3) 1000 units CAPS Take 1 capsule (1,000 Units total) by mouth daily with lunch. 60 capsule 0  . furosemide (LASIX) 20 MG tablet Take 1 tablet (20 mg total) by mouth daily. 30 tablet 1  . gabapentin (NEURONTIN) 100 MG capsule Take 5 capsules (500 mg total) by mouth at bedtime. 30 capsule 0  . levothyroxine (SYNTHROID, LEVOTHROID) 25 MCG tablet Take 1 tablet (25 mcg total) by mouth daily before breakfast. 30 tablet 0  . LORazepam (ATIVAN) 1 MG tablet Take 1 tablet (1 mg total) by mouth at bedtime. 30 tablet 2  . metoprolol tartrate (LOPRESSOR) 25  MG tablet Take 1 tablet (25 mg total) by mouth 2 (two) times daily. 60 tablet 0  . montelukast (SINGULAIR) 10 MG tablet Take 1 tablet (10 mg total) by mouth daily. 90 tablet 1  . pravastatin (PRAVACHOL) 40 MG tablet TAKE 1 TABLET DAILY BY MOUTH AT BEDTIME 90 tablet 0  . predniSONE (DELTASONE) 5 MG tablet Take 7.5 mg by mouth daily with breakfast.    . saccharomyces boulardii (FLORASTOR) 250 MG capsule Take 1 capsule (250 mg total) by mouth daily.    Marland Kitchen senna (SENOKOT) 8.6 MG TABS tablet Take 1 tablet (8.6 mg total) by mouth 2 (two) times daily. 120 each 0  . SYMBICORT 160-4.5 MCG/ACT inhaler INHALE 1 OR 2 PUFFS INTO THE LUNGS EVERY 12 HOURS. THEN GARGLE AND SPIT AFTER USE (Patient taking differently: Inhale 2 puffs into the lungs 2 (two) times daily. ) 6 g 10  . traMADol (ULTRAM) 50 MG tablet Take 1 tablet (50 mg total) by mouth every 6 (six) hours as needed for moderate pain. 60 tablet 0   No facility-administered medications prior to visit.      Allergies:   Simvastatin   Social History   Socioeconomic History  . Marital status: Married    Spouse name: Not on file  . Number of children: Not on file  . Years of education: Not on file  . Highest education level: Not on file  Occupational History  . Occupation:  retired  Scientific laboratory technician  . Financial resource strain: Not on file  . Food insecurity:    Worry: Not on file    Inability: Not on file  . Transportation needs:    Medical: Not on file    Non-medical: Not on file  Tobacco Use  . Smoking status: Former Smoker    Packs/day: 1.00    Years: 23.00    Pack years: 23.00    Last attempt to quit: 06/14/1989    Years since quitting: 28.8  . Smokeless tobacco: Never Used  . Tobacco comment: smoked 1961-1991 , up to < 1 ppd  Substance and Sexual Activity  . Alcohol use: Yes    Alcohol/week: 14.0 standard drinks    Types: 14 Glasses of wine per week    Comment: Wine  . Drug use: No  . Sexual activity: Not on file  Lifestyle  . Physical activity:    Days per week: Not on file    Minutes per session: Not on file  . Stress: Not on file  Relationships  . Social connections:    Talks on phone: Not on file    Gets together: Not on file    Attends religious service: Not on file    Active member of club or organization: Not on file    Attends meetings of clubs or organizations: Not on file    Relationship status: Not on file  Other Topics Concern  . Not on file  Social History Narrative  . Not on file     Family History:  The patient's family history includes COPD in her sister; Diabetes in her sister; Heart attack in her father; Heart attack (age of onset: 21) in her brother; Other in her son; Parkinsonism in her mother; Stroke in her mother.   ROS:   Please see the history of present illness.    ROS All other systems reviewed and are negative.   PHYSICAL EXAM:   VS:  BP 94/76   Pulse (!) 109   Ht 5\' 3"  (  1.6 m)   Wt 115 lb (52.2 kg)   BMI 20.37 kg/m    GEN: Well nourished, well developed, in no acute distress  HEENT: normal  Neck: no JVD, carotid bruits, or masses Cardiac: Tachycardic; no murmurs, rubs, or gallops. 2+ pitting ankle edema  Respiratory:  clear to auscultation bilaterally, normal work of breathing GI: soft,  nontender, nondistended, + BS MS: no deformity or atrophy  Skin: warm and dry, no rash Neuro:  Alert and Oriented x 3, Strength and sensation are intact Psych: euthymic mood, full affect  Wt Readings from Last 3 Encounters:  04/25/18 115 lb (52.2 kg)  04/11/18 116 lb 12.8 oz (53 kg)  04/10/18 116 lb (52.6 kg)      Studies/Labs Reviewed:   EKG:  EKG is ordered today.  The ekg ordered today demonstrates 2-1 atrial flutter  Recent Labs: 03/18/2018: B Natriuretic Peptide 534.0 03/22/2018: Magnesium 2.2 04/11/2018: ALT 13; BUN 22; Creatinine, Ser 1.33; Potassium 4.0; Sodium 137; TSH 10.57 04/12/2018: Hemoglobin 14.3; Platelets 414.0   Lipid Panel    Component Value Date/Time   CHOL 162 06/27/2017 0930   CHOL 281 (H) 06/19/2014 1016   TRIG 95.0 06/27/2017 0930   TRIG 58 06/19/2014 1016   TRIG 51 06/01/2006 0950   HDL 63.80 06/27/2017 0930   HDL 153 06/19/2014 1016   CHOLHDL 3 06/27/2017 0930   VLDL 19.0 06/27/2017 0930   LDLCALC 79 06/27/2017 0930   LDLCALC 116 (H) 06/19/2014 1016   LDLDIRECT 116.6 06/13/2013 0921    Additional studies/ records that were reviewed today include:   Echo 08/31/2017 LV EF: 60% -   65%  Study Conclusions  - Left ventricle: The cavity size was normal. Wall thickness was   normal. Systolic function was normal. The estimated ejection   fraction was in the range of 60% to 65%. Wall motion was normal;   there were no regional wall motion abnormalities. Features are   consistent with a pseudonormal left ventricular filling pattern,   with concomitant abnormal relaxation and increased filling   pressure (grade 2 diastolic dysfunction). - Left atrium: The atrium was moderately dilated. - Tricuspid valve: There was mild-moderate regurgitation. - Pulmonary arteries: Systolic pressure was moderately increased.   PA peak pressure: 47 mm Hg (S).    ASSESSMENT:    1. Chronic diastolic heart failure ([place])   2. Essential hypertension   3.  Paroxysmal atrial fibrillation ([facility])   4. Atrial flutter, unspecified type ([place])   5. Hyperlipidemia, unspecified hyperlipidemia type      PLAN:  In order of problems listed above:  1. Chronic diastolic heart failure: Due to worsening swelling, instructed the patient to increase Lasix to 40 mg daily for 3 days before dropping back down to 20 mg daily thereafter.  Compared to the last office visit, her lower extremity edema has significantly improved.  She continued to have 2+ ankle edema.  Given her borderline blood pressure, I am hesitant to increase her diuretic again.  I instructed the patient to continue with leg elevation when she is watching TV or sleeping.  She may take extra as needed dose of Lasix for worsening swelling.  Patient has been having recent swelling, I will obtain a echocardiogram given the recurrence of atrial fibrillation atrial flutter to make sure her ejection fraction is still okay.  2. Atrial flutter/atrial fibrillation.  She used to have atrial fibrillation (based on 08/29/2017 EKG), however today's EKG is more consistent with 2-1 atrial flutter.  She  has been compliant with Eliquis.  I discussed with her regarding setting up for outpatient cardioversion, she is hesitant to proceed at this time and wish to recover from the recent hip surgery before proceeding.  She is on amiodarone therapy.  Recent liver function test was okay.  Will order pulmonary function test.  3. Hypertension: Blood pressure borderline low today, it turned out the patient has been taking 50 mg daily of metoprolol tartrate instead of 25 mg twice daily as instructed.  She will start taking 25 mg twice daily instead  4. Hyperlipidemia: On pravastatin 40 mg daily.  Due for repeat lab work in January 2020.    Medication Adjustments/Labs and Tests Ordered: Current medicines are reviewed at length with the patient today.  Concerns regarding medicines are outlined above.  Medication changes, Labs and Tests  ordered today are listed in the Patient Instructions below. Patient Instructions  Medication Instructions:  Take Metoprolol 25 mg twice daily.  Continue taking Lasix 20 mg daily. You may take 1 extra tablet as needed.  If you need a refill on your cardiac medications before your next appointment, please call your pharmacy.   Lab work: none  Testing/Procedures: Your physician has requested that you have an echocardiogram. Echocardiography is a painless test that uses sound waves to create images of your heart. It provides your doctor with information about the size and shape of your heart and how well your heart's chambers and valves are working. This procedure takes approximately one hour. There are no restrictions for this procedure.  Your physician has recommended that you have a pulmonary function test. Pulmonary Function Tests are a group of tests that measure how well air moves in and out of your lungs.   Follow-Up: At [facility], you and your health needs are our priority.  As part of our continuing mission to provide you with exceptional heart care, we have created designated Provider Care Teams.  These Care Teams include your primary Cardiologist (physician) and Advanced Practice Providers (APPs -  Physician Assistants and Nurse Practitioners) who all work together to provide you with the care you need, when you need it. . Please keep your scheduled follow up appointment with Dr. Stanford Breed.  Any Other Special Instructions Will Be Listed Below (If Applicable). Your physician has requested that you regularly monitor and record your blood pressure readings at home. Please use the same machine at the same time of day to check your readings and record them to bring to your follow-up visit. If your heart rate is consistently staying over 100 bpm, please call our office [phone].      Hilbert Corrigan, [place]  04/25/2018 1:35 PM    [facility] [place], [place], [place]  [address] Phone: [phone]; Fax: [phone]

## 2018-04-25 NOTE — Patient Instructions (Signed)
Medication Instructions:  Take Metoprolol 25 mg twice daily.  Continue taking Lasix 20 mg daily. You may take 1 extra tablet as needed.  If you need a refill on your cardiac medications before your next appointment, please call your pharmacy.   Lab work: none  Testing/Procedures: Your physician has requested that you have an echocardiogram. Echocardiography is a painless test that uses sound waves to create images of your heart. It provides your doctor with information about the size and shape of your heart and how well your heart's chambers and valves are working. This procedure takes approximately one hour. There are no restrictions for this procedure.  Your physician has recommended that you have a pulmonary function test. Pulmonary Function Tests are a group of tests that measure how well air moves in and out of your lungs.   Follow-Up: At [facility], you and your health needs are our priority.  As part of our continuing mission to provide you with exceptional heart care, we have created designated Provider Care Teams.  These Care Teams include your primary Cardiologist (physician) and Advanced Practice Providers (APPs -  Physician Assistants and Nurse Practitioners) who all work together to provide you with the care you need, when you need it. . Please keep your scheduled follow up appointment with Dr. Stanford Breed.  Any Other Special Instructions Will Be Listed Below (If Applicable). Your physician has requested that you regularly monitor and record your blood pressure readings at home. Please use the same machine at the same time of day to check your readings and record them to bring to your follow-up visit. If your heart rate is consistently staying over 100 bpm, please call our office [phone].

## 2018-04-26 DIAGNOSIS — H540X55 Blindness right eye category 5, blindness left eye category 5: Secondary | ICD-10-CM | POA: Diagnosis not present

## 2018-05-02 ENCOUNTER — Ambulatory Visit (HOSPITAL_COMMUNITY): Payer: Medicare Other | Attending: Cardiology

## 2018-05-02 ENCOUNTER — Other Ambulatory Visit: Payer: Self-pay

## 2018-05-02 DIAGNOSIS — I480 Paroxysmal atrial fibrillation: Secondary | ICD-10-CM | POA: Diagnosis not present

## 2018-05-03 NOTE — Progress Notes (Signed)
HPI: FU atrial fibrillation. 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 Developed leg pain 3/19 and found to be in atrial fibrillation; ultrasound revealed thrombotic occlusion of her right SFA with reconstitution of popliteal artery above the knee, no ultrasound evidence of plaque, this appears to be consistent with emboli. 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 Pt converted to sinus spontaneously.  Last echocardiogram November 2019 showed normal LV function, mild to moderate mitral regurgitation, moderate left atrial enlargement and mild to moderate tricuspid regurgitation.  Patient had a right buttock subcutaneous hematoma September 2019.  Patient fell October 2019 and had a subcapital fracture of the right hip.  She was in atrial fibrillation with rapid ventricular response at that time.  She underwent repair of her hip fracture and was placed on amiodarone.  Seen in follow-up and noted to have worsening edema.  Lasix was increased.  Cardioversion was suggested but patient was hesitant.  Since last seen,  she denies dyspnea, chest pain, palpitations or syncope.  She does have pedal edema.  Current Outpatient Medications  Medication Sig Dispense Refill  . albuterol (PROVENTIL HFA;VENTOLIN HFA) 108 (90 Base) MCG/ACT inhaler INHALE 1 PUFF INTO THE LUNGS AS DIRECTED IF NEEDED 8.5 each 0  . alendronate (FOSAMAX) 70 MG tablet alendronate 70 mg tablet   70 mg by oral route.    Marland Kitchen amiodarone (PACERONE) 200 MG tablet Take 200 mg by mouth daily.    Marland Kitchen apixaban (ELIQUIS) 5 MG TABS tablet Take 1 tablet (5 mg total) by mouth 2 (two) times daily. 60 tablet 6  . Calcium Carbonate-Vitamin D (CALCIUM 600/VITAMIN D PO) Take 1 tablet by mouth daily.     . Cholecalciferol (VITAMIN D3) 1000 units CAPS Take 1 capsule (1,000 Units total) by mouth daily with lunch. 60 capsule 0  . furosemide (LASIX) 20 MG tablet  Take 1 tablet (20 mg total) by mouth daily. 30 tablet 1  . gabapentin (NEURONTIN) 100 MG capsule Take 5 capsules (500 mg total) by mouth at bedtime. 30 capsule 0  . LORazepam (ATIVAN) 1 MG tablet Take 1 tablet (1 mg total) by mouth at bedtime. 30 tablet 2  . metoprolol tartrate (LOPRESSOR) 25 MG tablet Take 1 tablet (25 mg total) by mouth 2 (two) times daily. 60 tablet 0  . montelukast (SINGULAIR) 10 MG tablet Take 1 tablet (10 mg total) by mouth daily. 90 tablet 1  . pravastatin (PRAVACHOL) 40 MG tablet TAKE 1 TABLET DAILY BY MOUTH AT BEDTIME 90 tablet 0  . predniSONE (DELTASONE) 5 MG tablet Take 7.5 mg by mouth daily with breakfast.    . SYMBICORT 160-4.5 MCG/ACT inhaler INHALE 1 OR 2 PUFFS INTO THE LUNGS EVERY 12 HOURS. THEN GARGLE AND SPIT AFTER USE (Patient taking differently: Inhale 2 puffs into the lungs 2 (two) times daily. ) 6 g 10  . traMADol (ULTRAM) 50 MG tablet Take 1 tablet (50 mg total) by mouth every 6 (six) hours as needed for moderate pain. 60 tablet 0  . acetaminophen (TYLENOL) 325 MG tablet Take 2 tablets (650 mg total) by mouth every 4 (four) hours as needed for mild pain, fever or headache. (Patient not taking: Reported on 05/15/2018)     No current facility-administered medications for this visit.      Past Medical History:  Diagnosis Date  . Adenomatous colon polyp 2007 & 2010    Dr [name]  .  Allergy    seasonal  . Arthritis   . Cataract   . Diverticulosis   . 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 Hyperlipidemia   . Hypertension   . IBS (irritable bowel syndrome)   . Legally blind   . MVP (mitral valve prolapse)   . Paroxysmal atrial fibrillation with rapid ventricular response ([place]) 08/29/2017  . Peripheral vascular disease ([place])   . RAD (reactive airway disease)     Past Surgical History:  Procedure Laterality Date  . ABDOMINAL HYSTERECTOMY     with USO for dysfunctionall menses  . APPENDECTOMY    . ARTERY BIOPSY Bilateral 07/21/2017    Procedure: BIOPSY BILATERAL TEMPORAL ARTERY;  Surgeon: [name], MD;  Location: [facility];  Service: Vascular;  Laterality: Bilateral;  . CATARACT EXTRACTION W/ INTRAOCULAR LENS IMPLANT  2013   bilateral; Dr [name]  . COLONOSCOPY  2013   negative , due 2018; Dr [name]  . COLONOSCOPY W/ POLYPECTOMY      X2 ; diverticulosis  . EMBOLECTOMY Right 08/29/2017   Procedure: Right Groin Exploration  ;  Surgeon: Rosetta Posner, MD;  Location: [facility];  Service: Vascular;  Laterality: Right;  . HIP PINNING,CANNULATED Right 03/21/2018  . HIP PINNING,CANNULATED Right 03/21/2018   Procedure: CANNULATED HIP PINNING;  Surgeon: Rod Can, MD;  Location: [facility];  Service: Orthopedics;  Laterality: Right;  . LUMBAR LAMINECTOMY  2000   Dr Vertell Limber  . SHOULDER SURGERY     R shoulder  . TONSILLECTOMY      Social History   Socioeconomic History  . Marital status: Married    Spouse name: Not on file  . Number of children: Not on file  . Years of education: Not on file  . Highest education level: Not on file  Occupational History  . Occupation: retired  Scientific laboratory technician  . Financial resource strain: Not on file  . Food insecurity:    Worry: Not on file    Inability: Not on file  . Transportation needs:    Medical: Not on file    Non-medical: Not on file  Tobacco Use  . Smoking status: Former Smoker    Packs/day: 1.00    Years: 23.00    Pack years: 23.00    Last attempt to quit: 06/14/1989    Years since quitting: 28.9  . Smokeless tobacco: Never Used  . Tobacco comment: smoked 1961-1991 , up to < 1 ppd  Substance and Sexual Activity  . Alcohol use: Yes    Alcohol/week: 14.0 standard drinks    Types: 14 Glasses of wine per week    Comment: Wine  . Drug use: No  . Sexual activity: Not on file  Lifestyle  . Physical activity:    Days per week: Not on file    Minutes per session: Not on file  . Stress: Not on file  Relationships  . Social connections:    Talks on phone: Not on file     Gets together: Not on file    Attends religious service: Not on file    Active member of club or organization: Not on file    Attends meetings of clubs or organizations: Not on file    Relationship status: Not on file  . Intimate partner violence:    Fear of current or ex partner: Not on file    Emotionally abused: Not on file    Physically abused: Not on file    Forced sexual activity: Not  on file  Other Topics Concern  . Not on file  Social History Narrative  . Not on file    Family History  Problem Relation Age of Onset  . Heart attack Brother 40  . Parkinsonism Mother   . Stroke Mother   . Heart attack Father        pre 62  . Diabetes Sister   . COPD Sister        X 3  . Other Son        suicide 2016  . Colon cancer Neg Hx   . Cancer Neg Hx   . Esophageal cancer Neg Hx   . Liver cancer Neg Hx   . Pancreatic cancer Neg Hx   . Rectal cancer Neg Hx   . Stomach cancer Neg Hx     ROS: no fevers or chills, productive cough, hemoptysis, dysphasia, odynophagia, melena, hematochezia, dysuria, hematuria, rash, seizure activity, orthopnea, PND, claudication. Remaining systems are negative.  Physical Exam: Well-developed well-nourished in no acute distress.  Skin is warm and dry.  HEENT is normal.  Blind Neck is supple.  Chest is clear to auscultation with normal expansion.  Cardiovascular exam is irregular Abdominal exam nontender or distended. No masses palpated. Extremities show 1+ edema. neuro grossly intact  ECG-atrial flutter at a rate of 85, septal infarct, left axis deviation.  Personally reviewed  A/P  1 paroxysmal atrial fibrillation/atrial flutter-patient is in atrial flutter today.  Heart rate is controlled.  She is only taking apixaban daily.  I have asked her to increase this to 5 mg twice daily.  Continue metoprolol and amiodarone.  I think atrial arrhythmias could be contributing to her lower extremity edema.  I would therefore like to proceed with  cardioversion in 4 weeks.  She is agreeable and we will arrange.  Hold metoprolol prior to procedure.  Check hemoglobin and renal function.  2 hypertension-patient's blood pressure is controlled.  Continue present medications and follow.  3 hyperlipidemia-continue statin.  4 chronic SFA occlusion-continue statin.  5 giant cell arteritis-Per neurology.  [name], MD

## 2018-05-05 DIAGNOSIS — H540X55 Blindness right eye category 5, blindness left eye category 5: Secondary | ICD-10-CM | POA: Diagnosis not present

## 2018-05-08 DIAGNOSIS — S72031D Displaced midcervical fracture of right femur, subsequent encounter for closed fracture with routine healing: Secondary | ICD-10-CM | POA: Diagnosis not present

## 2018-05-09 DIAGNOSIS — H540X55 Blindness right eye category 5, blindness left eye category 5: Secondary | ICD-10-CM | POA: Diagnosis not present

## 2018-05-10 ENCOUNTER — Inpatient Hospital Stay (HOSPITAL_COMMUNITY): Admission: RE | Admit: 2018-05-10 | Payer: Medicare Other | Source: Ambulatory Visit

## 2018-05-13 ENCOUNTER — Other Ambulatory Visit: Payer: Self-pay | Admitting: Internal Medicine

## 2018-05-15 ENCOUNTER — Encounter: Payer: Self-pay | Admitting: Cardiology

## 2018-05-15 ENCOUNTER — Ambulatory Visit: Payer: Medicare Other | Admitting: Cardiology

## 2018-05-15 VITALS — BP 104/66 | HR 85 | Ht 63.0 in | Wt 118.0 lb

## 2018-05-15 DIAGNOSIS — I10 Essential (primary) hypertension: Secondary | ICD-10-CM | POA: Diagnosis not present

## 2018-05-15 DIAGNOSIS — E7800 Pure hypercholesterolemia, unspecified: Secondary | ICD-10-CM

## 2018-05-15 DIAGNOSIS — I4892 Unspecified atrial flutter: Secondary | ICD-10-CM

## 2018-05-15 NOTE — Telephone Encounter (Signed)
Check [place] registry last filled 04/17/2018.Marland KitchenJohny [name]

## 2018-05-15 NOTE — Telephone Encounter (Signed)
MD approved and sent electronically to pof../[facility]mb  

## 2018-05-15 NOTE — Patient Instructions (Signed)
Medication Instructions:  INCREASE ELIQUIS TO 5 MG TWICE DAILY If you need a refill on your cardiac medications before your next appointment, please call your pharmacy.   Lab work: Your physician recommends that you HAVE LAB WORK TODAY If you have labs (blood work) drawn today and your tests are completely normal, you will receive your results only by: Marland Kitchen MyChart Message (if you have MyChart) OR . A paper copy in the mail If you have any lab test that is abnormal or we need to change your treatment, we will call you to review the results.  Testing/Procedures: Your physician has recommended that you have a Cardioversion (DCCV). Electrical Cardioversion uses a jolt of electricity to your heart either through paddles or wired patches attached to your chest. This is a controlled, usually prescheduled, procedure. Defibrillation is done under light anesthesia in the hospital, and you usually go home the day of the procedure. This is done to get your heart back into a normal rhythm. You are not awake for the procedure. Please see the instruction sheet given to you today.    Follow-Up: Your physician recommends that you schedule a follow-up appointment in: [place] are scheduled for a Cardioversion on Friday 06-16-18 with Dr. Stanford Breed.  Please arrive at the [facility] (Main Entrance A) at [facility]: [address] [place], [place] [address] at 7 am (1 hour prior to procedure unless lab work is needed; if lab work is needed arrive 1.5 hours ahead)  DIET: Nothing to eat or drink after midnight except a sip of water with medications (see medication instructions below)  Medication Instructions: Hold METOPROLOL THE MORNING OF THE PROCEDURE   Continue your anticoagulant: ELIQUIS  You must have a responsible person to drive you home and stay in the waiting area during your procedure. Failure to do so could result in cancellation.  Bring your insurance cards.  *Special  Note: Every effort is made to have your procedure done on time. Occasionally there are emergencies that occur at the hospital that may cause delays. Please be patient if a delay does occur.

## 2018-05-16 ENCOUNTER — Telehealth: Payer: Self-pay | Admitting: *Deleted

## 2018-05-16 ENCOUNTER — Other Ambulatory Visit: Payer: Self-pay | Admitting: *Deleted

## 2018-05-16 DIAGNOSIS — E875 Hyperkalemia: Secondary | ICD-10-CM

## 2018-05-16 LAB — BASIC METABOLIC PANEL
BUN/Creatinine Ratio: 13 (ref 12–28)
BUN/Creatinine Ratio: 20 (ref 12–28)
BUN: 21 mg/dL (ref 8–27)
BUN: 23 mg/dL (ref 8–27)
CO2: 25 mmol/L (ref 20–29)
CO2: 28 mmol/L (ref 20–29)
Calcium: 10 mg/dL (ref 8.7–10.3)
Calcium: 9.3 mg/dL (ref 8.7–10.3)
Chloride: 95 mmol/L — ABNORMAL LOW (ref 96–106)
Chloride: 96 mmol/L (ref 96–106)
Creatinine, Ser: 1.57 mg/dL — ABNORMAL HIGH (ref 0.57–1.00)
Creatinine, Ser: 1.17 mg/dL — ABNORMAL HIGH (ref 0.57–1.00)
GFR calc Af Amer: 37 mL/min/{1.73_m2} — ABNORMAL LOW (ref >59)
GFR calc Af Amer: 53 mL/min/{1.73_m2} — ABNORMAL LOW (ref >59)
GFR calc non Af Amer: 32 mL/min/{1.73_m2} — ABNORMAL LOW (ref >59)
GFR calc non Af Amer: 46 mL/min/{1.73_m2} — ABNORMAL LOW (ref >59)
Glucose: 133 mg/dL — ABNORMAL HIGH (ref 65–99)
Glucose: 167 mg/dL — ABNORMAL HIGH (ref 65–99)
Potassium: 5.8 mmol/L (ref 3.5–5.2)
Potassium: 5.1 mmol/L (ref 3.5–5.2)
Sodium: 140 mmol/L (ref 134–144)
Sodium: 137 mmol/L (ref 134–144)

## 2018-05-16 LAB — CBC
Hematocrit: 42.9 % (ref 34.0–46.6)
Hemoglobin: 14.5 g/dL (ref 11.1–15.9)
MCH: 35.3 pg — ABNORMAL HIGH (ref 26.6–33.0)
MCHC: 33.8 g/dL (ref 31.5–35.7)
MCV: 104 fL — ABNORMAL HIGH (ref 79–97)
Platelets: 315 10*3/uL (ref 150–450)
RBC: 4.11 x10E6/uL (ref 3.77–5.28)
RDW: 14.3 % (ref 12.3–15.4)
WBC: 9.1 10*3/uL (ref 3.4–10.8)

## 2018-05-16 NOTE — Telephone Encounter (Signed)
Spoke with pt and husband, they report the patient is taking 2 potassium pills daily and has already taken them this morning. Explained the medication from yesterday does not reflect that she is taking potassium. Patient voiced understanding of medication change regarding the furosemide. She will stop the potassium and will come by the office today for repeat labs. Encouraged patient to bring a accurate medication list to all appointments to avoid errors.

## 2018-05-16 NOTE — Progress Notes (Signed)
bmp 

## 2018-05-16 NOTE — Telephone Encounter (Signed)
Called to give  Critical lab result of potassium 5.8.  Dr Stanford Breed aware and call paced to patient by  Hilda Blades- - see other telephone call

## 2018-05-16 NOTE — Telephone Encounter (Addendum)
Left message for pt to call   ----- Message from [name], MD sent at 05/16/2018  8:39 AM EST ----- Hold Lasix today, tomorrow and then resume 20 mg daily thereafter.  Repeat potassium today.  Low potassium diet.  If creatinine remains greater than 1.5 may need to decrease apixaban dose. [name]

## 2018-05-17 ENCOUNTER — Telehealth: Payer: Self-pay | Admitting: *Deleted

## 2018-05-17 DIAGNOSIS — E875 Hyperkalemia: Secondary | ICD-10-CM

## 2018-05-17 NOTE — Telephone Encounter (Signed)
Left message for patient of dr Jacalyn Lefevre recommendations. Lab orders mailed to the pt

## 2018-05-17 NOTE — Telephone Encounter (Signed)
-----   Message from [name], MD sent at 05/17/2018  7:31 AM EST ----- Resume lasix 20 mg daily; DC kdur; bmet one week [name]

## 2018-05-19 DIAGNOSIS — H540X55 Blindness right eye category 5, blindness left eye category 5: Secondary | ICD-10-CM | POA: Diagnosis not present

## 2018-05-23 DIAGNOSIS — H540X55 Blindness right eye category 5, blindness left eye category 5: Secondary | ICD-10-CM | POA: Diagnosis not present

## 2018-05-26 DIAGNOSIS — E875 Hyperkalemia: Secondary | ICD-10-CM | POA: Diagnosis not present

## 2018-05-26 LAB — BASIC METABOLIC PANEL
BUN/Creatinine Ratio: 21 (ref 12–28)
BUN: 28 mg/dL — ABNORMAL HIGH (ref 8–27)
CO2: 26 mmol/L (ref 20–29)
Calcium: 9.4 mg/dL (ref 8.7–10.3)
Chloride: 92 mmol/L — ABNORMAL LOW (ref 96–106)
Creatinine, Ser: 1.33 mg/dL — ABNORMAL HIGH (ref 0.57–1.00)
GFR calc Af Amer: 45 mL/min/{1.73_m2} — ABNORMAL LOW (ref >59)
GFR calc non Af Amer: 39 mL/min/{1.73_m2} — ABNORMAL LOW (ref >59)
Glucose: 77 mg/dL (ref 65–99)
Potassium: 4.1 mmol/L (ref 3.5–5.2)
Sodium: 140 mmol/L (ref 134–144)

## 2018-05-29 DIAGNOSIS — H540X55 Blindness right eye category 5, blindness left eye category 5: Secondary | ICD-10-CM | POA: Diagnosis not present

## 2018-05-31 ENCOUNTER — Other Ambulatory Visit: Payer: Self-pay | Admitting: Internal Medicine

## 2018-06-01 ENCOUNTER — Ambulatory Visit: Payer: Self-pay | Admitting: Internal Medicine

## 2018-06-01 MED ORDER — ALBUTEROL SULFATE HFA 108 (90 BASE) MCG/ACT IN AERS: INHALATION_SPRAY | RESPIRATORY_TRACT | 2 refills | Status: DC

## 2018-06-01 NOTE — Telephone Encounter (Signed)
Medication sent to pharmacy with new directions.

## 2018-06-08 DIAGNOSIS — H540X55 Blindness right eye category 5, blindness left eye category 5: Secondary | ICD-10-CM | POA: Diagnosis not present

## 2018-06-13 DIAGNOSIS — H540X55 Blindness right eye category 5, blindness left eye category 5: Secondary | ICD-10-CM | POA: Diagnosis not present

## 2018-06-15 ENCOUNTER — Other Ambulatory Visit: Payer: Self-pay | Admitting: Internal Medicine

## 2018-06-16 ENCOUNTER — Ambulatory Visit (HOSPITAL_COMMUNITY)
Admission: RE | Admit: 2018-06-16 | Discharge: 2018-06-16 | Disposition: A | Discharge status: Home / Self Care | Payer: Medicare Other | Source: Ambulatory Visit | Attending: Cardiology | Admitting: Cardiology

## 2018-06-16 ENCOUNTER — Encounter (HOSPITAL_COMMUNITY): Payer: Self-pay | Admitting: *Deleted

## 2018-06-16 ENCOUNTER — Ambulatory Visit (HOSPITAL_COMMUNITY): Payer: Medicare Other | Admitting: Registered Nurse

## 2018-06-16 ENCOUNTER — Other Ambulatory Visit: Payer: Self-pay

## 2018-06-16 ENCOUNTER — Encounter (HOSPITAL_COMMUNITY)
Admission: RE | Disposition: A | Discharge status: Home / Self Care | Payer: Self-pay | Source: Ambulatory Visit | Attending: Cardiology

## 2018-06-16 DIAGNOSIS — Z7901 Long term (current) use of anticoagulants: Secondary | ICD-10-CM | POA: Diagnosis not present

## 2018-06-16 DIAGNOSIS — Z7951 Long term (current) use of inhaled steroids: Secondary | ICD-10-CM | POA: Insufficient documentation

## 2018-06-16 DIAGNOSIS — I480 Paroxysmal atrial fibrillation: Secondary | ICD-10-CM | POA: Diagnosis not present

## 2018-06-16 DIAGNOSIS — M316 Other giant cell arteritis: Secondary | ICD-10-CM | POA: Diagnosis not present

## 2018-06-16 DIAGNOSIS — I341 Nonrheumatic mitral (valve) prolapse: Secondary | ICD-10-CM | POA: Diagnosis not present

## 2018-06-16 DIAGNOSIS — E785 Hyperlipidemia, unspecified: Secondary | ICD-10-CM | POA: Insufficient documentation

## 2018-06-16 DIAGNOSIS — I4892 Unspecified atrial flutter: Secondary | ICD-10-CM | POA: Insufficient documentation

## 2018-06-16 DIAGNOSIS — I10 Essential (primary) hypertension: Secondary | ICD-10-CM | POA: Insufficient documentation

## 2018-06-16 DIAGNOSIS — I739 Peripheral vascular disease, unspecified: Secondary | ICD-10-CM | POA: Insufficient documentation

## 2018-06-16 DIAGNOSIS — K589 Irritable bowel syndrome without diarrhea: Secondary | ICD-10-CM | POA: Insufficient documentation

## 2018-06-16 DIAGNOSIS — J45909 Unspecified asthma, uncomplicated: Secondary | ICD-10-CM | POA: Insufficient documentation

## 2018-06-16 DIAGNOSIS — M1990 Unspecified osteoarthritis, unspecified site: Secondary | ICD-10-CM | POA: Insufficient documentation

## 2018-06-16 DIAGNOSIS — J449 Chronic obstructive pulmonary disease, unspecified: Secondary | ICD-10-CM | POA: Diagnosis not present

## 2018-06-16 DIAGNOSIS — Z87891 Personal history of nicotine dependence: Secondary | ICD-10-CM | POA: Diagnosis not present

## 2018-06-16 DIAGNOSIS — Z7952 Long term (current) use of systemic steroids: Secondary | ICD-10-CM | POA: Diagnosis not present

## 2018-06-16 DIAGNOSIS — I110 Hypertensive heart disease with heart failure: Secondary | ICD-10-CM | POA: Diagnosis not present

## 2018-06-16 DIAGNOSIS — I4819 Other persistent atrial fibrillation: Secondary | ICD-10-CM | POA: Diagnosis not present

## 2018-06-16 HISTORY — PX: CARDIOVERSION: SHX1299

## 2018-06-16 LAB — POCT I-STAT 4, (NA,K, GLUC, HGB,HCT)
Glucose, Bld: 64 mg/dL — ABNORMAL LOW (ref 70–99)
HCT: 42 % (ref 36.0–46.0)
Hemoglobin: 14.3 g/dL (ref 12.0–15.0)
Potassium: 3.3 mmol/L — ABNORMAL LOW (ref 3.5–5.1)
Sodium: 138 mmol/L (ref 135–145)

## 2018-06-16 SURGERY — CARDIOVERSION
Anesthesia: Monitor Anesthesia Care

## 2018-06-16 MED ORDER — PROPOFOL 10 MG/ML IV BOLUS
INTRAVENOUS | Status: DC | PRN
  Administered 2018-06-16: 20 mg via INTRAVENOUS
  Administered 2018-06-16: 50 mg via INTRAVENOUS
  Administered 2018-06-16: 30 mg via INTRAVENOUS

## 2018-06-16 MED ORDER — POTASSIUM CHLORIDE CRYS ER 20 MEQ PO TBCR: 40.0000 meq | EXTENDED_RELEASE_TABLET | Freq: Once | ORAL | Status: DC

## 2018-06-16 MED ORDER — SODIUM CHLORIDE 0.9% FLUSH: 3.0000 mL | Freq: Two times a day (BID) | INTRAVENOUS | Status: DC

## 2018-06-16 MED ORDER — ONDANSETRON HCL 4 MG/2ML IJ SOLN: 4.0000 mg | Freq: Once | INTRAMUSCULAR | Status: DC | PRN

## 2018-06-16 MED ORDER — OXYCODONE HCL 5 MG PO TABS: 5.0000 mg | ORAL_TABLET | Freq: Once | ORAL | Status: DC | PRN

## 2018-06-16 MED ORDER — AMIODARONE HCL 200 MG PO TABS: 200.0000 mg | ORAL_TABLET | Freq: Every day | ORAL | 6 refills | Status: DC

## 2018-06-16 MED ORDER — FENTANYL CITRATE (PF) 100 MCG/2ML IJ SOLN: 25.0000 ug | INTRAMUSCULAR | Status: DC | PRN

## 2018-06-16 MED ORDER — LIDOCAINE 2% (20 MG/ML) 5 ML SYRINGE
INTRAMUSCULAR | Status: DC | PRN
  Administered 2018-06-16: 60 mg via INTRAVENOUS

## 2018-06-16 MED ORDER — OXYCODONE HCL 5 MG/5ML PO SOLN: 5.0000 mg | Freq: Once | ORAL | Status: DC | PRN

## 2018-06-16 MED ORDER — SODIUM CHLORIDE 0.9 % IV SOLN
250.0000 mL | INTRAVENOUS | Status: DC
  Administered 2018-06-16: 08:00:00 via INTRAVENOUS

## 2018-06-16 MED ORDER — SODIUM CHLORIDE 0.9% FLUSH: 3.0000 mL | INTRAVENOUS | Status: DC | PRN

## 2018-06-16 NOTE — Anesthesia Procedure Notes (Signed)
Date/Time: 06/16/2018 7:51 AM Performed by: Trinna Post., CRNA Pre-anesthesia Checklist: Patient identified, Emergency Drugs available, Suction available, Patient being monitored and Timeout performed Patient Re-evaluated:Patient Re-evaluated prior to induction Oxygen Delivery Method: Ambu bag Preoxygenation: Pre-oxygenation with 100% oxygen Induction Type: IV induction Placement Confirmation: positive ETCO2

## 2018-06-16 NOTE — Procedures (Signed)
Electrical Cardioversion Procedure Note Crystal [name] [id number] [birth year]  Procedure: Electrical Cardioversion Indications:  Atrial Flutter  Procedure Details Consent: Risks of procedure as well as the alternatives and risks of each were explained to the (patient/caregiver).  Consent for procedure obtained. Time Out: Verified patient identification, verified procedure, site/side was marked, verified correct patient position, special equipment/implants available, medications/allergies/relevent history reviewed, required imaging and test results available.  Performed  Patient placed on cardiac monitor, pulse oximetry, supplemental oxygen as necessary.  Sedation given: Pt sedated by anesthesia with lidocaine 60 mg and diprovan 100 mg IV. Pacer pads placed anterior and posterior chest.  Cardioverted 1 time(s).  Cardioverted at 120J.  Evaluation Findings: Post procedure EKG shows: NSR Complications: None Patient did tolerate procedure well.   [name] 06/16/2018, 7:33 AM

## 2018-06-16 NOTE — Transfer of Care (Signed)
Immediate Anesthesia Transfer of Care Note  Patient: Crystal [name]  Procedure(s) Performed: CARDIOVERSION (N/A )  Patient Location: PACU and Endoscopy Unit  Anesthesia Type:MAC  Level of Consciousness: awake, alert  and oriented  Airway & Oxygen Therapy: Patient Spontanous Breathing  Post-op Assessment: Report given to RN and Post -op Vital signs reviewed and stable  Post vital signs: Reviewed and stable  Last Vitals:  Vitals Value Taken Time  BP    Temp    Pulse    Resp    SpO2      Last Pain:  Vitals:   06/16/18 0715  TempSrc: Oral  PainSc: 0-No pain         Complications: No apparent anesthesia complications

## 2018-06-16 NOTE — Progress Notes (Signed)
Dr. Stanford Breed called me requesting to place refill order on pt's amio 200mg  daily - sent. Dayna Dunn PA-C

## 2018-06-16 NOTE — Anesthesia Postprocedure Evaluation (Signed)
Anesthesia Post Note  Patient: Crystal [name]  Procedure(s) Performed: CARDIOVERSION (N/A )     Patient location during evaluation: Endoscopy Anesthesia Type: General Level of consciousness: awake Pain management: pain level controlled Vital Signs Assessment: post-procedure vital signs reviewed and stable Respiratory status: spontaneous breathing Cardiovascular status: stable Postop Assessment: no apparent nausea or vomiting Anesthetic complications: no    Last Vitals:  Vitals:   06/16/18 0715 06/16/18 0802  BP: 135/74 (!) 90/43  Pulse: (!) 123   Resp: 16 18  Temp: 36.5 C   SpO2: 95%     Last Pain:  Vitals:   06/16/18 0802  TempSrc: Oral  PainSc: 0-No pain   Pain Goal:                 Huston Foley

## 2018-06-16 NOTE — Anesthesia Preprocedure Evaluation (Signed)
Anesthesia Evaluation  Patient identified by MRN, date of birth, ID band Patient awake    Reviewed: Allergy & Precautions, NPO status , Patient's Chart, lab work & pertinent test results, reviewed documented beta blocker date and time   Airway Mallampati: I       Dental no notable dental hx. (+) Teeth Intact   Pulmonary asthma , COPD,  COPD inhaler, former smoker,    Pulmonary exam normal breath sounds clear to auscultation       Cardiovascular hypertension, Pt. on medications and Pt. on home beta blockers Normal cardiovascular exam Rhythm:Irregular Rate:Normal     Neuro/Psych PSYCHIATRIC DISORDERS    GI/Hepatic negative GI ROS, Neg liver ROS,   Endo/Other  Hypothyroidism   Renal/GU negative Renal ROS     Musculoskeletal   Abdominal Normal abdominal exam  (+)   Peds  Hematology   Anesthesia Other Findings   Reproductive/Obstetrics                             Anesthesia Physical Anesthesia Plan  ASA: II  Anesthesia Plan: General   Post-op Pain Management:    Induction: Intravenous  PONV Risk Score and Plan:   Airway Management Planned: Natural Airway and Simple Face Mask  Additional Equipment:   Intra-op Plan:   Post-operative Plan:   Informed Consent: I have reviewed the patients History and Physical, chart, labs and discussed the procedure including the risks, benefits and alternatives for the proposed anesthesia with the patient or authorized representative who has indicated his/her understanding and acceptance.     Plan Discussed with: CRNA  Anesthesia Plan Comments:         Anesthesia Quick Evaluation

## 2018-06-16 NOTE — Interval H&P Note (Signed)
History and Physical Interval Note:  06/16/2018 7:35 AM  Crystal [name]  has presented today for surgery, with the diagnosis of ATRIAL FLUTTER  The various methods of treatment have been discussed with the patient and family. After consideration of risks, benefits and other options for treatment, the patient has consented to  Procedure(s): CARDIOVERSION (N/A) as a surgical intervention .  The patient's history has been reviewed, patient examined, no change in status, stable for surgery.  I have reviewed the patient's chart and labs.  Questions were answered to the patient's satisfaction.     [name]

## 2018-06-16 NOTE — H&P (Signed)
Office Visit   05/15/2018 [facility] Wendy Poet, MD  Cardiology   Atrial flutter, unspecified type ([place]) +2 more  Dx   Follow-up , Atrial Fibrillation ; Referred by [name], MD  Reason for Visit   Additional Documentation   Vitals:   BP 104/66 (BP Location: Left Arm, Patient Position: Sitting, Cuff Size: Normal)   Pulse 85   Ht 5\' 3"  (1.6 m)   Wt 53.5 kg   BMI 20.90 kg/m   BSA 1.54 m     More Vitals   Flowsheets:   Anthropometrics,   MEWS Score     Encounter Info:   Billing Info,   History,   Allergies,   Detailed Report     All Notes   Progress Notes by [name], MD at 05/15/2018 2:00 PM  Author: [name], MD Author Type: Physician Filed: 05/15/2018 2:40 PM  Note Status: Signed Cosign: Cosign Not Required Encounter Date: 05/15/2018  Editor: [name], MD (Physician)         HPI: FU atrial fibrillation. 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 Developed leg pain 3/19 and found to be in atrial fibrillation; ultrasound revealed thrombotic occlusion of her right SFA with reconstitution of popliteal artery above the knee, no ultrasound evidence of plaque, this appears to be consistent with emboli. 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  Last echocardiogram November 2019 showed normal LV function, mild to moderate mitral regurgitation, moderate left atrial enlargement and mild to moderate tricuspid regurgitation.  Patient had a right buttock subcutaneous hematoma September 2019.  Patient fell October 2019 and had a subcapital fracture of the right hip.  She was in atrial fibrillation with rapid ventricular response at that time.  She underwent repair of her hip fracture and was placed on amiodarone.  Seen in follow-up and noted to have worsening edema.  Lasix was increased.   Cardioversion was suggested but patient was hesitant.  Since last seen, she denies dyspnea, chest pain, palpitations or syncope.  She does have pedal edema.        Current Outpatient Medications  Medication Sig Dispense Refill  . albuterol (PROVENTIL HFA;VENTOLIN HFA) 108 (90 Base) MCG/ACT inhaler INHALE 1 PUFF INTO THE LUNGS AS DIRECTED IF NEEDED 8.5 each 0  . alendronate (FOSAMAX) 70 MG tablet alendronate 70 mg tablet   70 mg by oral route.    Marland Kitchen amiodarone (PACERONE) 200 MG tablet Take 200 mg by mouth daily.    Marland Kitchen apixaban (ELIQUIS) 5 MG TABS tablet Take 1 tablet (5 mg total) by mouth 2 (two) times daily. 60 tablet 6  . Calcium Carbonate-Vitamin D (CALCIUM 600/VITAMIN D PO) Take 1 tablet by mouth daily.     . Cholecalciferol (VITAMIN D3) 1000 units CAPS Take 1 capsule (1,000 Units total) by mouth daily with lunch. 60 capsule 0  . furosemide (LASIX) 20 MG tablet Take 1 tablet (20 mg total) by mouth daily. 30 tablet 1  . gabapentin (NEURONTIN) 100 MG capsule Take 5 capsules (500 mg total) by mouth at bedtime. 30 capsule 0  . LORazepam (ATIVAN) 1 MG tablet Take 1 tablet (1 mg total) by mouth at bedtime. 30 tablet 2  . metoprolol tartrate (LOPRESSOR) 25 MG tablet Take 1 tablet (25 mg total) by mouth 2 (two) times daily. 60 tablet 0  . montelukast (  SINGULAIR) 10 MG tablet Take 1 tablet (10 mg total) by mouth daily. 90 tablet 1  . pravastatin (PRAVACHOL) 40 MG tablet TAKE 1 TABLET DAILY BY MOUTH AT BEDTIME 90 tablet 0  . predniSONE (DELTASONE) 5 MG tablet Take 7.5 mg by mouth daily with breakfast.    . SYMBICORT 160-4.5 MCG/ACT inhaler INHALE 1 OR 2 PUFFS INTO THE LUNGS EVERY 12 HOURS. THEN GARGLE AND SPIT AFTER USE (Patient taking differently: Inhale 2 puffs into the lungs 2 (two) times daily. ) 6 g 10  . traMADol (ULTRAM) 50 MG tablet Take 1 tablet (50 mg total) by mouth every 6 (six) hours as needed for moderate pain. 60 tablet 0  . acetaminophen (TYLENOL) 325 MG tablet Take 2 tablets  (650 mg total) by mouth every 4 (four) hours as needed for mild pain, fever or headache. (Patient not taking: Reported on 05/15/2018)     No current facility-administered medications for this visit.          Past Medical History:  Diagnosis Date  . Adenomatous colon polyp 2007 & 2010    Dr [name]  . Allergy    seasonal  . Arthritis   . Cataract   . Diverticulosis   . 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 Hyperlipidemia   . Hypertension   . IBS (irritable bowel syndrome)   . Legally blind   . MVP (mitral valve prolapse)   . Paroxysmal atrial fibrillation with rapid ventricular response ([place]) 08/29/2017  . Peripheral vascular disease ([place])   . RAD (reactive airway disease)          Past Surgical History:  Procedure Laterality Date  . ABDOMINAL HYSTERECTOMY     with USO for dysfunctionall menses  . APPENDECTOMY    . ARTERY BIOPSY Bilateral 07/21/2017   Procedure: BIOPSY BILATERAL TEMPORAL ARTERY;  Surgeon: [name], MD;  Location: [facility];  Service: Vascular;  Laterality: Bilateral;  . CATARACT EXTRACTION W/ INTRAOCULAR LENS IMPLANT  2013   bilateral; Dr [name]  . COLONOSCOPY  2013   negative , due 2018; Dr [name]  . COLONOSCOPY W/ POLYPECTOMY      X2 ; diverticulosis  . EMBOLECTOMY Right 08/29/2017   Procedure: Right Groin Exploration  ;  Surgeon: Rosetta Posner, MD;  Location: [facility];  Service: Vascular;  Laterality: Right;  . HIP PINNING,CANNULATED Right 03/21/2018  . HIP PINNING,CANNULATED Right 03/21/2018   Procedure: CANNULATED HIP PINNING;  Surgeon: Rod Can, MD;  Location: [facility];  Service: Orthopedics;  Laterality: Right;  . LUMBAR LAMINECTOMY  2000   Dr Vertell Limber  . SHOULDER SURGERY     R shoulder  . TONSILLECTOMY      Social History        Socioeconomic History  . Marital status: Married    Spouse name: Not on file  . Number of children: Not on file  . Years of education: Not on file    . Highest education level: Not on file  Occupational History  . Occupation: retired  Scientific laboratory technician  . Financial resource strain: Not on file  . Food insecurity:    Worry: Not on file    Inability: Not on file  . Transportation needs:    Medical: Not on file    Non-medical: Not on file  Tobacco Use  . Smoking status: Former Smoker    Packs/day: 1.00    Years: 23.00    Pack years: 23.00    Last attempt to quit: 06/14/1989  Years since quitting: 28.9  . Smokeless tobacco: Never Used  . Tobacco comment: smoked 1961-1991 , up to < 1 ppd  Substance and Sexual Activity  . Alcohol use: Yes    Alcohol/week: 14.0 standard drinks    Types: 14 Glasses of wine per week    Comment: Wine  . Drug use: No  . Sexual activity: Not on file  Lifestyle  . Physical activity:    Days per week: Not on file    Minutes per session: Not on file  . Stress: Not on file  Relationships  . Social connections:    Talks on phone: Not on file    Gets together: Not on file    Attends religious service: Not on file    Active member of club or organization: Not on file    Attends meetings of clubs or organizations: Not on file    Relationship status: Not on file  . Intimate partner violence:    Fear of current or ex partner: Not on file    Emotionally abused: Not on file    Physically abused: Not on file    Forced sexual activity: Not on file  Other Topics Concern  . Not on file  Social History Narrative  . Not on file         Family History  Problem Relation Age of Onset  . Heart attack Brother 23  . Parkinsonism Mother   . Stroke Mother   . Heart attack Father        pre 95  . Diabetes Sister   . COPD Sister        X 3  . Other Son        suicide 2016  . Colon cancer Neg Hx   . Cancer Neg Hx   . Esophageal cancer Neg Hx   . Liver cancer Neg Hx   . Pancreatic cancer Neg Hx   . Rectal cancer Neg Hx   . Stomach cancer Neg  Hx     ROS: no fevers or chills, productive cough, hemoptysis, dysphasia, odynophagia, melena, hematochezia, dysuria, hematuria, rash, seizure activity, orthopnea, PND, claudication. Remaining systems are negative.  Physical Exam: Well-developed well-nourished in no acute distress.  Skin is warm and dry.  HEENT is normal.  Blind Neck is supple.  Chest is clear to auscultation with normal expansion.  Cardiovascular exam is irregular Abdominal exam nontender or distended. No masses palpated. Extremities show 1+ edema. neuro grossly intact  ECG-atrial flutter at a rate of 85, septal infarct, left axis deviation.  Personally reviewed  A/P  1 paroxysmal atrial fibrillation/atrial flutter-patient is in atrial flutter today.  Heart rate is controlled.  She is only taking apixaban daily.  I have asked her to increase this to 5 mg twice daily.  Continue metoprolol and amiodarone.  I think atrial arrhythmias could be contributing to her lower extremity edema.  I would therefore like to proceed with cardioversion in 4 weeks.  She is agreeable and we will arrange.  Hold metoprolol prior to procedure.  Check hemoglobin and renal function.  2 hypertension-patient's blood pressure is controlled.  Continue present medications and follow.  3 hyperlipidemia-continue statin.  4 chronic SFA occlusion-continue statin.  5 giant cell arteritis-Per neurology.  [name], MD     For DCCV; compliant with apixaban; no changes [name]

## 2018-06-18 ENCOUNTER — Encounter (HOSPITAL_COMMUNITY): Payer: Self-pay | Admitting: Cardiology

## 2018-06-19 DIAGNOSIS — S72031D Displaced midcervical fracture of right femur, subsequent encounter for closed fracture with routine healing: Secondary | ICD-10-CM | POA: Diagnosis not present

## 2018-06-20 NOTE — Addendum Note (Signed)
Addendum  created 06/20/18 1455 by Lyn Hollingshead, MD   [place] recorded in [place], [place] filed

## 2018-06-22 DIAGNOSIS — H540X55 Blindness right eye category 5, blindness left eye category 5: Secondary | ICD-10-CM | POA: Diagnosis not present

## 2018-06-28 DIAGNOSIS — H540X55 Blindness right eye category 5, blindness left eye category 5: Secondary | ICD-10-CM | POA: Diagnosis not present

## 2018-07-03 DIAGNOSIS — H540X55 Blindness right eye category 5, blindness left eye category 5: Secondary | ICD-10-CM | POA: Diagnosis not present

## 2018-07-05 ENCOUNTER — Encounter: Payer: Medicare Other | Admitting: Internal Medicine

## 2018-07-05 DIAGNOSIS — Z7951 Long term (current) use of inhaled steroids: Secondary | ICD-10-CM | POA: Diagnosis not present

## 2018-07-05 DIAGNOSIS — Z79899 Other long term (current) drug therapy: Secondary | ICD-10-CM | POA: Diagnosis not present

## 2018-07-05 DIAGNOSIS — Z87891 Personal history of nicotine dependence: Secondary | ICD-10-CM | POA: Diagnosis not present

## 2018-07-05 DIAGNOSIS — M316 Other giant cell arteritis: Secondary | ICD-10-CM | POA: Diagnosis not present

## 2018-07-05 DIAGNOSIS — M7989 Other specified soft tissue disorders: Secondary | ICD-10-CM | POA: Diagnosis not present

## 2018-07-05 DIAGNOSIS — Z7952 Long term (current) use of systemic steroids: Secondary | ICD-10-CM | POA: Diagnosis not present

## 2018-07-05 DIAGNOSIS — Z5181 Encounter for therapeutic drug level monitoring: Secondary | ICD-10-CM | POA: Diagnosis not present

## 2018-07-05 DIAGNOSIS — M818 Other osteoporosis without current pathological fracture: Secondary | ICD-10-CM | POA: Diagnosis not present

## 2018-07-06 DIAGNOSIS — M47812 Spondylosis without myelopathy or radiculopathy, cervical region: Secondary | ICD-10-CM | POA: Diagnosis not present

## 2018-07-07 ENCOUNTER — Other Ambulatory Visit: Payer: Self-pay | Admitting: Internal Medicine

## 2018-07-09 ENCOUNTER — Other Ambulatory Visit: Payer: Self-pay | Admitting: Internal Medicine

## 2018-07-10 ENCOUNTER — Emergency Department (HOSPITAL_BASED_OUTPATIENT_CLINIC_OR_DEPARTMENT_OTHER): Payer: Medicare Other

## 2018-07-10 ENCOUNTER — Emergency Department (HOSPITAL_COMMUNITY): Payer: Medicare Other

## 2018-07-10 ENCOUNTER — Other Ambulatory Visit: Payer: Self-pay

## 2018-07-10 ENCOUNTER — Encounter (HOSPITAL_COMMUNITY): Payer: Self-pay | Admitting: Emergency Medicine

## 2018-07-10 ENCOUNTER — Emergency Department (HOSPITAL_COMMUNITY)
Admission: EM | Admit: 2018-07-10 | Discharge: 2018-07-11 | Disposition: A | Discharge status: Home / Self Care | Payer: Medicare Other | Attending: Emergency Medicine | Admitting: Emergency Medicine

## 2018-07-10 DIAGNOSIS — Z79899 Other long term (current) drug therapy: Secondary | ICD-10-CM | POA: Diagnosis not present

## 2018-07-10 DIAGNOSIS — Z87891 Personal history of nicotine dependence: Secondary | ICD-10-CM | POA: Insufficient documentation

## 2018-07-10 DIAGNOSIS — S32040A Wedge compression fracture of fourth lumbar vertebra, initial encounter for closed fracture: Secondary | ICD-10-CM

## 2018-07-10 DIAGNOSIS — Y939 Activity, unspecified: Secondary | ICD-10-CM | POA: Diagnosis not present

## 2018-07-10 DIAGNOSIS — R1030 Lower abdominal pain, unspecified: Secondary | ICD-10-CM | POA: Diagnosis not present

## 2018-07-10 DIAGNOSIS — R609 Edema, unspecified: Secondary | ICD-10-CM

## 2018-07-10 DIAGNOSIS — I480 Paroxysmal atrial fibrillation: Secondary | ICD-10-CM | POA: Insufficient documentation

## 2018-07-10 DIAGNOSIS — M545 Low back pain: Secondary | ICD-10-CM | POA: Diagnosis not present

## 2018-07-10 DIAGNOSIS — E785 Hyperlipidemia, unspecified: Secondary | ICD-10-CM | POA: Insufficient documentation

## 2018-07-10 DIAGNOSIS — Z7901 Long term (current) use of anticoagulants: Secondary | ICD-10-CM | POA: Diagnosis not present

## 2018-07-10 DIAGNOSIS — Y33XXXA Other specified events, undetermined intent, initial encounter: Secondary | ICD-10-CM | POA: Diagnosis not present

## 2018-07-10 DIAGNOSIS — I10 Essential (primary) hypertension: Secondary | ICD-10-CM | POA: Insufficient documentation

## 2018-07-10 DIAGNOSIS — Y929 Unspecified place or not applicable: Secondary | ICD-10-CM | POA: Diagnosis not present

## 2018-07-10 DIAGNOSIS — Y998 Other external cause status: Secondary | ICD-10-CM | POA: Insufficient documentation

## 2018-07-10 DIAGNOSIS — K760 Fatty (change of) liver, not elsewhere classified: Secondary | ICD-10-CM | POA: Diagnosis not present

## 2018-07-10 DIAGNOSIS — R52 Pain, unspecified: Secondary | ICD-10-CM | POA: Diagnosis not present

## 2018-07-10 DIAGNOSIS — L538 Other specified erythematous conditions: Secondary | ICD-10-CM | POA: Diagnosis not present

## 2018-07-10 DIAGNOSIS — K5790 Diverticulosis of intestine, part unspecified, without perforation or abscess without bleeding: Secondary | ICD-10-CM | POA: Diagnosis not present

## 2018-07-10 DIAGNOSIS — M5489 Other dorsalgia: Secondary | ICD-10-CM | POA: Diagnosis not present

## 2018-07-10 LAB — URINALYSIS, ROUTINE W REFLEX MICROSCOPIC
Bilirubin Urine: NEGATIVE
Glucose, UA: NEGATIVE mg/dL
Hgb urine dipstick: NEGATIVE
Ketones, ur: 20 mg/dL — AB
Leukocytes, UA: NEGATIVE
Nitrite: NEGATIVE
Protein, ur: NEGATIVE mg/dL
Specific Gravity, Urine: 1.012 (ref 1.005–1.030)
pH: 7 (ref 5.0–8.0)

## 2018-07-10 LAB — CBC WITH DIFFERENTIAL/PLATELET
Abs Immature Granulocytes: 0.06 10*3/uL (ref 0.00–0.07)
Basophils Absolute: 0 10*3/uL (ref 0.0–0.1)
Basophils Relative: 1 %
Eosinophils Absolute: 0 10*3/uL (ref 0.0–0.5)
Eosinophils Relative: 0 %
HCT: 38.7 % (ref 36.0–46.0)
Hemoglobin: 12.7 g/dL (ref 12.0–15.0)
Immature Granulocytes: 1 %
Lymphocytes Relative: 10 %
Lymphs Abs: 0.6 10*3/uL — ABNORMAL LOW (ref 0.7–4.0)
MCH: 37.1 pg — ABNORMAL HIGH (ref 26.0–34.0)
MCHC: 32.8 g/dL (ref 30.0–36.0)
MCV: 113.2 fL — ABNORMAL HIGH (ref 80.0–100.0)
Monocytes Absolute: 1 10*3/uL (ref 0.1–1.0)
Monocytes Relative: 15 %
Neutro Abs: 4.6 10*3/uL (ref 1.7–7.7)
Neutrophils Relative %: 73 %
Platelets: 284 10*3/uL (ref 150–400)
RBC: 3.42 MIL/uL — ABNORMAL LOW (ref 3.87–5.11)
RDW: 16.3 % — ABNORMAL HIGH (ref 11.5–15.5)
WBC: 6.3 10*3/uL (ref 4.0–10.5)
nRBC: 0 % (ref 0.0–0.2)

## 2018-07-10 LAB — BASIC METABOLIC PANEL
Anion gap: 17 — ABNORMAL HIGH (ref 5–15)
BUN: 16 mg/dL (ref 8–23)
CO2: 23 mmol/L (ref 22–32)
Calcium: 10.1 mg/dL (ref 8.9–10.3)
Chloride: 94 mmol/L — ABNORMAL LOW (ref 98–111)
Creatinine, Ser: 0.97 mg/dL (ref 0.44–1.00)
GFR calc Af Amer: >60 mL/min (ref >60)
GFR calc non Af Amer: 58 mL/min — ABNORMAL LOW (ref >60)
Glucose, Bld: 70 mg/dL (ref 70–99)
Potassium: 4.8 mmol/L (ref 3.5–5.1)
Sodium: 134 mmol/L — ABNORMAL LOW (ref 135–145)

## 2018-07-10 LAB — BRAIN NATRIURETIC PEPTIDE: B Natriuretic Peptide: 123 pg/mL — ABNORMAL HIGH (ref 0.0–100.0)

## 2018-07-10 MED ORDER — ONDANSETRON HCL 4 MG/2ML IJ SOLN
4.0000 mg | Freq: Once | INTRAMUSCULAR | Status: AC
  Administered 2018-07-10: 4 mg via INTRAVENOUS
  Filled 2018-07-10: qty 2

## 2018-07-10 MED ORDER — DEXAMETHASONE SODIUM PHOSPHATE 10 MG/ML IJ SOLN: 10.0000 mg | Freq: Once | INTRAMUSCULAR | Status: DC

## 2018-07-10 MED ORDER — HYDROMORPHONE HCL 1 MG/ML IJ SOLN
0.5000 mg | Freq: Once | INTRAMUSCULAR | Status: AC
  Administered 2018-07-10: 0.5 mg via INTRAVENOUS
  Filled 2018-07-10: qty 1

## 2018-07-10 MED ORDER — OXYCODONE-ACETAMINOPHEN 5-325 MG PO TABS: 1.0000 | ORAL_TABLET | Freq: Four times a day (QID) | ORAL | 0 refills | Status: DC | PRN

## 2018-07-10 MED ORDER — IOHEXOL 300 MG/ML  SOLN
100.0000 mL | Freq: Once | INTRAMUSCULAR | Status: AC | PRN
  Administered 2018-07-10: 100 mL via INTRAVENOUS

## 2018-07-10 MED ORDER — MORPHINE SULFATE (PF) 4 MG/ML IV SOLN
6.0000 mg | Freq: Once | INTRAVENOUS | Status: AC
  Administered 2018-07-10: 6 mg via INTRAVENOUS
  Filled 2018-07-10: qty 2

## 2018-07-10 MED ORDER — HYDROMORPHONE HCL 1 MG/ML IJ SOLN: 0.7500 mg | Freq: Once | INTRAMUSCULAR | Status: DC

## 2018-07-10 MED ORDER — SODIUM CHLORIDE 0.9 % IV SOLN
INTRAVENOUS | Status: DC
  Administered 2018-07-10: 20:00:00 via INTRAVENOUS

## 2018-07-10 NOTE — Progress Notes (Signed)
LE venous duplex       has been completed. Preliminary results can be found under CV proc through chart review. Malachi Suderman, BS, RDMS, RVT   

## 2018-07-10 NOTE — ED Notes (Signed)
Pt updated with approx CT scan time

## 2018-07-10 NOTE — ED Provider Notes (Signed)
[facility] EMERGENCY DEPARTMENT Provider Note   CSN: [id number] Arrival date & time: 07/10/18  1609     History   Chief Complaint Chief Complaint  Patient presents with  . Back Pain  . Leg Swelling    HPI Crystal [name] is a 73 y.o. female.  HPI   73 year old female with lower back pain.  Acute on chronic issue and has received injections previously.  Pain is across the lower back.  Does not lateralize.  It is significantly worse when she moves  andnow she has difficulty sitting up.  It does not radiate.  She denies any radicular symptoms.  No acute urinary complaints.  No incontinence.  She also reports ongoing issues with lower extremity edema.  She reports compliance with her medications.   Past Medical History:  Diagnosis Date  . Adenomatous colon polyp 2007 & 2010    Dr [name]  . Allergy    seasonal  . Arthritis   . Cataract   . Diverticulosis   . 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 Hyperlipidemia   . Hypertension   . IBS (irritable bowel syndrome)   . Legally blind   . MVP (mitral valve prolapse)   . Paroxysmal atrial fibrillation with rapid ventricular response ([place]) 08/29/2017  . Peripheral vascular disease ([place])   . RAD (reactive airway disease)     Patient Active Problem List   Diagnosis Date Noted  . Subclinical hypothyroidism 04/11/2018  . Fracture of femoral neck, right ([place]) 03/22/2018  . Atrial fibrillation ([place]) 03/18/2018  . 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 Paroxysmal atrial fibrillation ([facility])   . Herpes zoster without complication 24/58/0998  . Jaw claudication 07/27/2017  . Temporal arteritis ([place]) 07/18/2017  . Vision, loss, sudden, bilateral 07/18/2017  . 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 Osteoarthritis 06/25/2016  . Anxiety 06/25/2016  . Back pain 07/18/2015  . 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 Osteoporosis 06/03/2008  . History of colonic polyps  06/03/2008  . INTENTION TREMOR 12/06/2007  . IBS 10/16/2007  . Hyperlipidemia 06/06/2007  . Essential hypertension 06/06/2007  . Chronic obstructive airway disease with asthma ([place]) 02/23/2007    Past Surgical History:  Procedure Laterality Date  . ABDOMINAL HYSTERECTOMY     with USO for dysfunctionall menses  . APPENDECTOMY    . ARTERY BIOPSY Bilateral 07/21/2017   Procedure: BIOPSY BILATERAL TEMPORAL ARTERY;  Surgeon: [name], MD;  Location: [facility];  Service: Vascular;  Laterality: Bilateral;  . CARDIOVERSION N/A 06/16/2018   Procedure: CARDIOVERSION;  Surgeon: [name], MD;  Location: [facility];  Service: Cardiovascular;  Laterality: N/A;  . CATARACT EXTRACTION W/ INTRAOCULAR LENS IMPLANT  2013   bilateral; Dr [name]  . COLONOSCOPY  2013   negative , due 2018; Dr [name]  . COLONOSCOPY W/ POLYPECTOMY      X2 ; diverticulosis  . EMBOLECTOMY Right 08/29/2017   Procedure: Right Groin Exploration  ;  Surgeon: Rosetta Posner, MD;  Location: [facility];  Service: Vascular;  Laterality: Right;  . HIP PINNING,CANNULATED Right 03/21/2018  . HIP PINNING,CANNULATED Right 03/21/2018   Procedure: CANNULATED HIP PINNING;  Surgeon: Rod Can, MD;  Location: [facility];  Service: Orthopedics;  Laterality: Right;  . LUMBAR LAMINECTOMY  2000   Dr Vertell Limber  . SHOULDER SURGERY     R shoulder  . TONSILLECTOMY       OB History   No obstetric history on file.  Home Medications    Prior to Admission medications   Medication Sig Start Date End Date Taking? Authorizing Provider  albuterol (PROVENTIL HFA;VENTOLIN HFA) 108 (90 Base) MCG/ACT inhaler INHALE 1-2  PUFFS INTO THE LUNGS EVERY 6 HOURS AS NEEDED FOR SHORTNESS OF BREATH AND WHEEZING 06/01/18   [name], MD  alendronate (FOSAMAX) 70 MG tablet Take 70 mg by mouth every Monday.  01/27/18   [provider]  amiodarone (PACERONE) 200 MG tablet Take 1 tablet (200 mg total) by mouth daily. 06/16/18   Dunn, Nedra Hai,  PA-C  apixaban (ELIQUIS) 5 MG TABS tablet Take 1 tablet (5 mg total) by mouth 2 (two) times daily. 03/30/18   Angiulli, Lavon Paganini, PA-C  bismuth subsalicylate (PEPTO BISMOL) 262 MG/15ML suspension Take 30 mLs by mouth as needed for indigestion.    [provider]  Calcium Citrate-Vitamin D (CALCIUM + D PO) Take 1 tablet by mouth daily.    [provider]  Cholecalciferol (VITAMIN D3) 1000 units CAPS Take 1 capsule (1,000 Units total) by mouth daily with lunch. 03/30/18   Angiulli, Lavon Paganini, PA-C  diltiazem (CARDIZEM CD) 120 MG 24 hr capsule Take 120 mg by mouth daily.    [provider]  furosemide (LASIX) 20 MG tablet Take 1 tablet (20 mg total) by mouth daily. 03/30/18   Angiulli, Lavon Paganini, PA-C  furosemide (LASIX) 40 MG tablet TAKE ONE TABLET BY MOUTH DAILY 07/10/18   [name], MD  gabapentin (NEURONTIN) 100 MG capsule Take 5 capsules (500 mg total) by mouth at bedtime. 03/30/18   Angiulli, Lavon Paganini, PA-C  LORazepam (ATIVAN) 1 MG tablet Take 1 tablet (1 mg total) by mouth at bedtime. Patient taking differently: Take 1 mg by mouth at bedtime as needed for anxiety.  03/30/18   Angiulli, Lavon Paganini, PA-C  Menthol-Methyl Salicylate (MUSCLE RUB) 10-15 % CREA Apply 1 application topically as needed for muscle pain.    [provider]  metoprolol tartrate (LOPRESSOR) 25 MG tablet Take 1 tablet (25 mg total) by mouth 2 (two) times daily. Patient not taking: Reported on 06/02/2018 03/30/18   Angiulli, Lavon Paganini, PA-C  metoprolol tartrate (LOPRESSOR) 50 MG tablet Take 25 mg by mouth 2 (two) times daily.    [provider]  montelukast (SINGULAIR) 10 MG tablet TAKE ONE TABLET BY MOUTH DAILY 07/10/18   [name], MD  potassium chloride SA (K-DUR,KLOR-CON) 20 MEQ tablet TAKE TWO TABLETS BY MOUTH DAILY 06/15/18   [name], MD  predniSONE (DELTASONE) 10 MG tablet Take 15 mg by mouth daily with breakfast.    [provider]  SYMBICORT 160-4.5  MCG/ACT inhaler INHALE 1 OR 2 PUFFS INTO THE LUNGS EVERY 12 HOURS. THEN GARGLE AND SPIT AFTER USE Patient taking differently: Inhale 2 puffs into the lungs daily.  09/26/17   [name], MD  theophylline (THEO-24) 400 MG 24 hr capsule Take 200 mg by mouth 2 (two) times daily.    [provider]  theophylline (UNIPHYL) 400 MG 24 hr tablet TAKE 1 TABLET (400 MG TOTAL) BY MOUTH DAILY. 07/10/18   [name], MD  traMADol (ULTRAM) 50 MG tablet Take 1 tablet (50 mg total) by mouth every 6 (six) hours as needed for moderate pain. 03/30/18   Angiulli, Lavon Paganini, PA-C    Family History Family History  Problem Relation Age of Onset  . Heart attack Brother 68  . Parkinsonism Mother   . Stroke Mother   .  Heart attack Father        pre 40  . Diabetes Sister   . COPD Sister        X 3  . Other Son        suicide 2016  . Colon cancer Neg Hx   . Cancer Neg Hx   . Esophageal cancer Neg Hx   . Liver cancer Neg Hx   . Pancreatic cancer Neg Hx   . Rectal cancer Neg Hx   . Stomach cancer Neg Hx     Social History Social History   Tobacco Use  . Smoking status: Former Smoker    Packs/day: 1.00    Years: 23.00    Pack years: 23.00    Last attempt to quit: 06/14/1989    Years since quitting: 29.0  . Smokeless tobacco: Never Used  . Tobacco comment: smoked 1961-1991 , up to < 1 ppd  Substance Use Topics  . Alcohol use: Yes    Alcohol/week: 14.0 standard drinks    Types: 14 Glasses of wine per week    Comment: Wine  . Drug use: No     Allergies   Simvastatin   Review of Systems Review of Systems  All systems reviewed and negative, other than as noted in HPI.  Physical Exam Updated Vital Signs BP (!) 149/62   Pulse 88   Temp 98.5 F (36.9 C) (Rectal)   Resp 11   Ht 5\' 4"  (1.626 m)   Wt 52.2 kg   SpO2 97%   BMI 19.74 kg/m   Physical Exam Vitals signs and nursing note reviewed.  Constitutional:      General: She is not in acute distress.    Appearance: She  is well-developed.  HENT:     Head: Normocephalic and atraumatic.  Eyes:     General:        Right eye: No discharge.        Left eye: No discharge.     Conjunctiva/sclera: Conjunctivae normal.  Neck:     Musculoskeletal: Neck supple.  Cardiovascular:     Rate and Rhythm: Normal rate and regular rhythm.     Heart sounds: Normal heart sounds. No murmur. No friction rub. No gallop.   Pulmonary:     Effort: Pulmonary effort is normal. No respiratory distress.     Breath sounds: Normal breath sounds.  Abdominal:     General: There is no distension.     Palpations: Abdomen is soft.     Tenderness: There is no abdominal tenderness.  Musculoskeletal:        General: No tenderness.     Comments: Tenderness in the mid to lower lumbar region in the midline.  No overlying skin changes.  Normal strength in bilateral lower extremities.  Sensation intact to light touch.  Skin:    General: Skin is warm and dry.  Neurological:     Mental Status: She is alert.  Psychiatric:        Behavior: Behavior normal.        Thought Content: Thought content normal.      ED Treatments / Results  Labs (all labs ordered are listed, but only abnormal results are displayed) Labs Reviewed  URINE CULTURE - Abnormal; Notable for the following components:      Result Value   Culture 60,000 COLONIES/mL CITROBACTER KOSERI (*)    Organism ID, Bacteria CITROBACTER KOSERI (*)    All other components within normal limits  CBC WITH DIFFERENTIAL/PLATELET -  Abnormal; Notable for the following components:   RBC 3.42 (*)    MCV 113.2 (*)    MCH 37.1 (*)    RDW 16.3 (*)    Lymphs Abs 0.6 (*)    All other components within normal limits  BASIC METABOLIC PANEL - Abnormal; Notable for the following components:   Sodium 134 (*)    Chloride 94 (*)    GFR calc non Af Amer 58 (*)    Anion gap 17 (*)    All other components within normal limits  URINALYSIS, ROUTINE W REFLEX MICROSCOPIC - Abnormal; Notable for the  following components:   Ketones, ur 20 (*)    All other components within normal limits  BRAIN NATRIURETIC PEPTIDE - Abnormal; Notable for the following components:   B Natriuretic Peptide 123.0 (*)    All other components within normal limits    EKG None  Radiology No results found.   Ct Abdomen Pelvis W Contrast  Result Date: 07/10/2018 CLINICAL DATA:  Acute abdominal pain. Lower abdominal pain and low back pain. EXAM: CT ABDOMEN AND PELVIS WITH CONTRAST TECHNIQUE: Multidetector CT imaging of the abdomen and pelvis was performed using the standard protocol following bolus administration of intravenous contrast. CONTRAST:  133mL OMNIPAQUE IOHEXOL 300 MG/ML  SOLN COMPARISON:  None. FINDINGS: Lower chest: Nonspecific subpleural ground-glass opacities in the right greater than left lower lobe. Linear right lower lobe atelectasis or scarring. 4 mm right middle lobe pulmonary nodule image 2 series 5. Hepatobiliary: Focal fatty infiltration adjacent to the falciform ligament. No suspicious hepatic lesion. Mild gallbladder distention without pericholecystic inflammation or calcified gallstone. No biliary dilatation. Pancreas: Mild parenchymal atrophy. No ductal dilatation or inflammation. Spleen: Normal in size without focal abnormality. Adrenals/Urinary Tract: Mild left adrenal thickening without dominant nodule. Right adrenal gland is normal. No hydronephrosis or perinephric edema. Homogeneous renal enhancement with symmetric excretion on delayed phase imaging. 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 Mild prominence of the lower pole moiety renal collecting system and proximal ureter, likely incidental. Urinary bladder is distended without wall thickening. Stomach/Bowel: Lack of enteric contrast limits detailed bowel assessment. Stomach is physiologically distended. No small bowel inflammation, obstruction or wall thickening. Appendix not visualized, surgically absent per history.  Moderate stool burden throughout the colon. Diverticulosis of the distal descending and sigmoid colon without diverticulitis. Area of nondistention versus circumferential colonic wall thickening in the mid distal sigmoid, image 56 series 3. No abnormal rectal distention. No colonic inflammation. Vascular/Lymphatic: Moderate aortic atherosclerosis. No aneurysm. 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 Portal vein and mesenteric vessels are patent. No adenopathy. Reproductive: Post hysterectomy. No adnexal mass. Left ovary visualized and unremarkable. Right ovary is not seen. Other: No free air, free fluid, or intra-abdominal fluid collection. Musculoskeletal: Heterogeneous enlargement of the left gluteus maximus posterior to the ischial tuberosity with intramuscular fluid collection measuring 1.7 x 3.0 cm. There is some peripheral enhancement, this is not entirely included in the field of view. Mild patchy edema about the adjacent subcutaneous tissues. The bones are under mineralized. Intact surgical hardware in the right hip. Mild superior endplate compression fracture of L4 with buckling of the posterior cortex. Prominent Schmorl's node superior endplate of L1 and L2 are unchanged from lumbar spine MRI 12/31/2015. IMPRESSION: 1. No definite acute intra-abdominal/pelvic findings. 2. Heterogeneous enlargement of the left gluteus maximus muscle posterior to the ischial tuberosity with intramuscular fluid collection measuring 1.7 x 3.0 cm. This is not  entirely included in the field of view. This may reflect intramuscular abscess versus liquefying hematoma. Recommend correlation for any trauma history. This could be further assessed with MRI based on clinical concern. 3. Moderate colonic stool burden with diverticulosis. No diverticulitis. Within the mid distal distal sigmoid colon there is an area of nondistention versus circumferential wall thickening.  Recommend up-to-date colonoscopy to exclude colonic neoplasm. 4. Mild L4 superior endplate compression fracture, new from 2017 and likely acute. 5.  Aortic Atherosclerosis (ICD10-I70.0). 6. Right middle lobe 4 mm pulmonary nodule. No follow-up needed if patient is low-risk. Non-contrast chest CT can be considered in 12 months if patient is high-risk. This recommendation follows the consensus statement: Guidelines for Management of Incidental Pulmonary Nodules Detected on CT Images: From the Fleischner Society 2017; Radiology 2017; 284:228-243. Electronically Signed   By: Keith Rake M.D.   On: 07/10/2018 22:54   Vas [place] Lower Extremity Venous (dvt) (only [facility])  Result Date: 07/11/2018  Lower Venous Study Indications: Edema, and Erythema.  Examination Guidelines: A complete evaluation includes B-mode imaging, spectral Doppler, color Doppler, and power Doppler as needed of all accessible portions of each vessel. Bilateral testing is considered an integral part of a complete examination. Limited examinations for reoccurring indications may be performed as noted.  Right Venous Findings: +---------+---------------+---------+-----------+----------+-------+          CompressibilityPhasicitySpontaneityPropertiesSummary +---------+---------------+---------+-----------+----------+-------+ CFV      Full           Yes      Yes                          +---------+---------------+---------+-----------+----------+-------+ SFJ      Full                                                 +---------+---------------+---------+-----------+----------+-------+ FV Prox  Full                                                 +---------+---------------+---------+-----------+----------+-------+ FV Mid   Full                                                 +---------+---------------+---------+-----------+----------+-------+ FV DistalFull                                                  +---------+---------------+---------+-----------+----------+-------+ PFV      Full                                                 +---------+---------------+---------+-----------+----------+-------+ POP      Full           Yes      Yes                          +---------+---------------+---------+-----------+----------+-------+  PTV      Full                                                 +---------+---------------+---------+-----------+----------+-------+ PERO     Full                                                 +---------+---------------+---------+-----------+----------+-------+  Left Venous Findings: +---------+---------------+---------+-----------+----------+-------+          CompressibilityPhasicitySpontaneityPropertiesSummary +---------+---------------+---------+-----------+----------+-------+ CFV      Full           Yes      Yes                          +---------+---------------+---------+-----------+----------+-------+ SFJ      Full                                                 +---------+---------------+---------+-----------+----------+-------+ FV Prox  Full                                                 +---------+---------------+---------+-----------+----------+-------+ FV Mid   Full                                                 +---------+---------------+---------+-----------+----------+-------+ FV DistalFull                                                 +---------+---------------+---------+-----------+----------+-------+ PFV      Full                                                 +---------+---------------+---------+-----------+----------+-------+ POP      Full           Yes      Yes                          +---------+---------------+---------+-----------+----------+-------+ PTV      Full                                                  +---------+---------------+---------+-----------+----------+-------+ PERO     Full                                                 +---------+---------------+---------+-----------+----------+-------+  Summary: Right: There is no evidence of deep vein thrombosis in the lower extremity. No cystic structure found in the popliteal fossa. Left: There is no evidence of deep vein thrombosis in the lower extremity. No cystic structure found in the popliteal fossa.  *See table(s) above for measurements and observations. Electronically signed by Ruta Hinds MD on 07/11/2018 at 11:46:29 AM.    Final     Procedures Procedures (including critical care time)  Medications Ordered in ED Medications  0.9 %  sodium chloride infusion ( Intravenous New Bag/Given 07/10/18 1951)  morphine 4 MG/ML injection 6 mg (6 mg Intravenous Given 07/10/18 1952)  ondansetron (ZOFRAN) injection 4 mg (4 mg Intravenous Given 07/10/18 2001)     Initial Impression / Assessment and Plan / ED Course  I have reviewed the triage vital signs and the nursing notes.  Pertinent labs & imaging results that were available during my care of the patient were reviewed by me and considered in my medical decision making (see chart for details).     73 year old female with acute on chronic lower back pain.  She is additionally complaining some lower extremity edema, but this is more chronic in nature.  Renal insufficiency but improved today from recent labs.  She reports no improvement of her swelling despite cardioversion a few weeks ago.  She has no acute respiratory complaints.  In terms of her back pain, it is acute on chronic.  This may be a exacerbation of it.  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  She has been weaned down but has been on steroids for approximately 1 year.  Pathologic fracture is a consideration.  Neurologically she is intact.   Interestingly, she has some lower abdominal tenderness on exam although she did not specifically complain of abdominal pain.  UA is fine. Will CT.  Will be able to evaluate for intra-abdominal pathology as well as assess her lower spine as well.   Pain likely from pathologic fracture.  Also muscular hematoma noted but clinically her symptoms are more consistent with a compression fracture.  Pain medicine.  Outpatient follow-up.  Return precautions discussed.  Final Clinical Impressions(s) / ED Diagnoses   Final diagnoses:  Closed compression fracture of L4 lumbar vertebra, initial encounter [facility])    ED Discharge Orders    None       Virgel Manifold, MD 07/17/18 1553

## 2018-07-10 NOTE — Progress Notes (Signed)
Order for LE venous duplex at 6:35 pm, vascular tech called ED at 6:57 PM as was not paged for this.

## 2018-07-10 NOTE — ED Triage Notes (Signed)
Pt BIB [facility], c/o lower back pain and bilateral lower leg swelling. Last dose of lasix yesterday, pitting edema noted to both legs. EMS VSS.

## 2018-07-11 ENCOUNTER — Ambulatory Visit: Payer: Medicare Other | Admitting: Internal Medicine

## 2018-07-11 ENCOUNTER — Encounter: Payer: Self-pay | Admitting: Internal Medicine

## 2018-07-11 DIAGNOSIS — R600 Localized edema: Secondary | ICD-10-CM

## 2018-07-11 DIAGNOSIS — I480 Paroxysmal atrial fibrillation: Secondary | ICD-10-CM

## 2018-07-11 DIAGNOSIS — R911 Solitary pulmonary nodule: Secondary | ICD-10-CM

## 2018-07-11 DIAGNOSIS — S32040A Wedge compression fracture of fourth lumbar vertebra, initial encounter for closed fracture: Secondary | ICD-10-CM

## 2018-07-11 DIAGNOSIS — I10 Essential (primary) hypertension: Secondary | ICD-10-CM

## 2018-07-11 DIAGNOSIS — IMO0001 Reserved for inherently not codable concepts without codable children: Secondary | ICD-10-CM | POA: Insufficient documentation

## 2018-07-11 MED ORDER — METHOCARBAMOL 500 MG PO TABS: 500.0000 mg | ORAL_TABLET | Freq: Four times a day (QID) | ORAL | 3 refills | Status: DC | PRN

## 2018-07-11 NOTE — Assessment & Plan Note (Signed)
CT scan 07/10/2018 in the emergency room-4 mm right middle lung nodule History of smoking Likely low risk, but will get a CT of the lungs in 1 year to follow-up-due 06/2019

## 2018-07-11 NOTE — Progress Notes (Signed)
Subjective:    Patient ID: Crystal [name], female    DOB: 1946-02-28, 73 y.o.   MRN: [id number]  HPI The patient is here for an acute visit.  She did go to the emergency room last night.   Leg swelling:  She has bilateral lower leg swelling since she broke her hip in October 2019.  She is taking lasix 40 mg daily and potassium daily.  She swelling gets worse as the day goes on.  Swelling improved slightly overnight, does not improve as much as it used to.  She wears compression socks, but not daily and they do help a little.  She is compliant with a low-sodium diet.  She elevates her legs when sitting.  She denies any pain from the swelling.  She does feel some general fluid retention-her cheeks are puffy and her hands feel swollen at times.  Shaking -she has been experiencing intermittent shaking.  She describes it as her whole body to start shaking for no reason-there is no pattern to when it happens.  One time she did fall, but often she needs to sit down.  It will go away on its own.  She denies any associated symptoms including lightheadedness, palpitations, chest pain or confusion.      Former smoker, lung nodule: She had a CT scan of her abdomen pelvis yesterday and it showed a 4 mm right middle lung nodule.  She denies any respiratory symptoms.  Gluteal collection on left 1/7 mc x 3.0cm - abscess or hematoma.  In the past few months she has had a fall where she landed on her back and buttock region.  This is the likely was caused the hematoma.  She states that she does have some pain in the left buttock region, but it is manageable.  There is been no fevers or chills.  Mild L4 compression fracture, new:  She fell a couple of times in the past few months.    She sees Dr Alfonso Ramus.  She is taking pain medication and she feels the pain is tolerable.  It is difficult to sleep at night because any movement causes pain.  Hypertension: She has taken her medication daily as prescribed.  She has not  had any chest pain or palpitations.  Medications and allergies reviewed with patient and updated if appropriate.  Patient Active Problem List   Diagnosis Date Noted  . Subclinical hypothyroidism 04/11/2018  . Fracture of femoral neck, right ([place]) 03/22/2018  . Atrial fibrillation ([place]) 03/18/2018  . 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 Paroxysmal atrial fibrillation ([facility])   . Herpes zoster without complication 40/98/1191  . Jaw claudication 07/27/2017  . Temporal arteritis ([place]) 07/18/2017  . Vision, loss, sudden, bilateral 07/18/2017  . 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 Osteoarthritis 06/25/2016  . Anxiety 06/25/2016  . Back pain 07/18/2015  . 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 Osteoporosis 06/03/2008  . History of colonic polyps 06/03/2008  . INTENTION TREMOR 12/06/2007  . IBS 10/16/2007  . Hyperlipidemia 06/06/2007  . Essential hypertension 06/06/2007  . Chronic obstructive airway disease with asthma ([place]) 02/23/2007    Current Outpatient Medications on File Prior to Visit  Medication Sig Dispense Refill  . albuterol (PROVENTIL HFA;VENTOLIN HFA) 108 (90 Base) MCG/ACT inhaler INHALE 1-2  PUFFS INTO THE LUNGS EVERY 6 HOURS AS NEEDED FOR SHORTNESS OF BREATH AND WHEEZING 8.5 each 2  . alendronate (FOSAMAX) 70 MG tablet Take 70 mg by mouth every Monday.     Marland Kitchen  amiodarone (PACERONE) 200 MG tablet Take 1 tablet (200 mg total) by mouth daily. 30 tablet 6  . apixaban (ELIQUIS) 5 MG TABS tablet Take 1 tablet (5 mg total) by mouth 2 (two) times daily. 60 tablet 6  . Calcium Citrate-Vitamin D (CALCIUM + D PO) Take 1 tablet by mouth daily.    . Cholecalciferol (VITAMIN D3) 1000 units CAPS Take 1 capsule (1,000 Units total) by mouth daily with lunch. 60 capsule 0  . diltiazem (CARDIZEM CD) 120 MG 24 hr capsule Take 120 mg by mouth daily.    . furosemide (LASIX) 20 MG tablet Take 1 tablet (20 mg total) by mouth daily. 30 tablet 1  . furosemide  (LASIX) 40 MG tablet TAKE ONE TABLET BY MOUTH DAILY (Patient taking differently: Take 40 mg by mouth daily. ) 90 tablet 2  . gabapentin (NEURONTIN) 100 MG capsule Take 5 capsules (500 mg total) by mouth at bedtime. 30 capsule 0  . LORazepam (ATIVAN) 1 MG tablet Take 1 tablet (1 mg total) by mouth at bedtime. 30 tablet 2  . metoprolol tartrate (LOPRESSOR) 25 MG tablet Take 1 tablet (25 mg total) by mouth 2 (two) times daily. 60 tablet 0  . metoprolol tartrate (LOPRESSOR) 50 MG tablet Take 25 mg by mouth 2 (two) times daily as needed (for Afib).     . montelukast (SINGULAIR) 10 MG tablet TAKE ONE TABLET BY MOUTH DAILY (Patient taking differently: Take 10 mg by mouth at bedtime. ) 72 tablet 0  . oxyCODONE-acetaminophen (PERCOCET/ROXICET) 5-325 MG tablet Take 1-2 tablets by mouth every 6 (six) hours as needed for severe pain. 20 tablet 0  . potassium chloride SA (K-DUR,KLOR-CON) 20 MEQ tablet TAKE TWO TABLETS BY MOUTH DAILY (Patient taking differently: Take 40 mEq by mouth daily. ) 180 tablet 1  . predniSONE (DELTASONE) 10 MG tablet Take 5 mg by mouth daily with breakfast.     . SYMBICORT 160-4.5 MCG/ACT inhaler INHALE 1 OR 2 PUFFS INTO THE LUNGS EVERY 12 HOURS. THEN GARGLE AND SPIT AFTER USE (Patient taking differently: Inhale 2 puffs into the lungs daily. ) 6 g 10  . theophylline (UNIPHYL) 400 MG 24 hr tablet TAKE 1 TABLET (400 MG TOTAL) BY MOUTH DAILY. (Patient taking differently: Take 200 mg by mouth 2 (two) times daily. ) 90 tablet 2  . traMADol (ULTRAM) 50 MG tablet Take 1 tablet (50 mg total) by mouth every 6 (six) hours as needed for moderate pain. 60 tablet 0   No current facility-administered medications on file prior to visit.     Past Medical History:  Diagnosis Date  . Adenomatous colon polyp 2007 & 2010    Dr [name]  . Allergy    seasonal  . Arthritis   . Cataract   . Diverticulosis   . 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 Hyperlipidemia   . Hypertension   . IBS  (irritable bowel syndrome)   . Legally blind   . MVP (mitral valve prolapse)   . Paroxysmal atrial fibrillation with rapid ventricular response ([place]) 08/29/2017  . Peripheral vascular disease ([place])   . RAD (reactive airway disease)     Past Surgical History:  Procedure Laterality Date  . ABDOMINAL HYSTERECTOMY     with USO for dysfunctionall menses  . APPENDECTOMY    . ARTERY BIOPSY Bilateral 07/21/2017   Procedure: BIOPSY BILATERAL TEMPORAL ARTERY;  Surgeon: [name], MD;  Location: [facility];  Service: Vascular;  Laterality: Bilateral;  . CARDIOVERSION N/A  06/16/2018   Procedure: CARDIOVERSION;  Surgeon: [name], MD;  Location: [facility];  Service: Cardiovascular;  Laterality: N/A;  . CATARACT EXTRACTION W/ INTRAOCULAR LENS IMPLANT  2013   bilateral; Dr [name]  . COLONOSCOPY  2013   negative , due 2018; Dr [name]  . COLONOSCOPY W/ POLYPECTOMY      X2 ; diverticulosis  . EMBOLECTOMY Right 08/29/2017   Procedure: Right Groin Exploration  ;  Surgeon: Rosetta Posner, MD;  Location: [facility];  Service: Vascular;  Laterality: Right;  . HIP PINNING,CANNULATED Right 03/21/2018  . HIP PINNING,CANNULATED Right 03/21/2018   Procedure: CANNULATED HIP PINNING;  Surgeon: Rod Can, MD;  Location: [facility];  Service: Orthopedics;  Laterality: Right;  . LUMBAR LAMINECTOMY  2000   Dr Vertell Limber  . SHOULDER SURGERY     R shoulder  . TONSILLECTOMY      Social History   Socioeconomic History  . Marital status: Married    Spouse name: Not on file  . Number of children: Not on file  . Years of education: Not on file  . Highest education level: Not on file  Occupational History  . Occupation: retired  Scientific laboratory technician  . Financial resource strain: Not on file  . Food insecurity:    Worry: Not on file    Inability: Not on file  . Transportation needs:    Medical: Not on file    Non-medical: Not on file  Tobacco Use  . Smoking status: Former Smoker    Packs/day: 1.00    Years:  23.00    Pack years: 23.00    Last attempt to quit: 06/14/1989    Years since quitting: 29.0  . Smokeless tobacco: Never Used  . Tobacco comment: smoked 1961-1991 , up to < 1 ppd  Substance and Sexual Activity  . Alcohol use: Yes    Alcohol/week: 14.0 standard drinks    Types: 14 Glasses of wine per week    Comment: Wine  . Drug use: No  . Sexual activity: Not on file  Lifestyle  . Physical activity:    Days per week: Not on file    Minutes per session: Not on file  . Stress: Not on file  Relationships  . Social connections:    Talks on phone: Not on file    Gets together: Not on file    Attends religious service: Not on file    Active member of club or organization: Not on file    Attends meetings of clubs or organizations: Not on file    Relationship status: Not on file  Other Topics Concern  . Not on file  Social History Narrative  . Not on file    Family History  Problem Relation Age of Onset  . Heart attack Brother 5  . Parkinsonism Mother   . Stroke Mother   . Heart attack Father        pre 28  . Diabetes Sister   . COPD Sister        X 3  . Other Son        suicide 2016  . Colon cancer Neg Hx   . Cancer Neg Hx   . Esophageal cancer Neg Hx   . Liver cancer Neg Hx   . Pancreatic cancer Neg Hx   . Rectal cancer Neg Hx   . Stomach cancer Neg Hx     Review of Systems  Constitutional: Negative for chills and fever.  Respiratory: Negative  for cough, shortness of breath and wheezing.   Cardiovascular: Positive for leg swelling. Negative for chest pain and palpitations.  Musculoskeletal: Positive for back pain.  Neurological: Positive for headaches (mild). Negative for light-headedness.       Objective:   Vitals:   07/11/18 1113  BP: (!) 162/70  Pulse: 66  Resp: 16  Temp: 98.1 F (36.7 C)  SpO2: 96%   BP Readings from Last 3 Encounters:  07/11/18 (!) 162/70  07/10/18 (!) 115/46  06/16/18 (!) 110/56   Wt Readings from Last 3 Encounters:    07/11/18 120 lb (54.4 kg)  07/10/18 115 lb (52.2 kg)  06/16/18 111 lb (50.3 kg)   Body mass index is 20.6 kg/m.   Physical Exam Constitutional:      Appearance: Normal appearance.  HENT:     Head: Normocephalic.  Cardiovascular:     Rate and Rhythm: Normal rate and regular rhythm.     Heart sounds: Normal heart sounds. No murmur.  Pulmonary:     Effort: Pulmonary effort is normal.     Breath sounds: Normal breath sounds. No wheezing or rhonchi.  Musculoskeletal:     Comments: 2+ bilateral lower extremity pitting edema  Skin:    General: Skin is warm and dry.  Neurological:     Mental Status: She is alert.  Psychiatric:     Comments: Anxious mood and affect            Assessment & Plan:    See Problem List for Assessment and Plan of chronic medical problems.

## 2018-07-11 NOTE — Assessment & Plan Note (Signed)
Seen on CT scan 1/27 from the emergency room On pain medication Following with Dr. Alfonso Ramus Having back spasms-currently taking Flexeril at night, but this does make her shaky Can try methocarbamol-sent to pharmacy

## 2018-07-11 NOTE — Assessment & Plan Note (Signed)
Started after hip fracture in October 2019 Table No pain Taking Lasix 40 mg daily ?  Cardizem causing some of her edema-she will discuss with cardiology Does not want increase Lasix, which I agree with Use compression socks daily Elevate legs when sitting Low-sodium diet

## 2018-07-11 NOTE — Patient Instructions (Addendum)
  Medications reviewed and updated.  Changes include :   Methocarbamol for muscle spasms.    Your prescription(s) have been submitted to your pharmacy. Please take as directed and contact our office if you believe you are having problem(s) with the medication(s).

## 2018-07-11 NOTE — Assessment & Plan Note (Signed)
Blood pressure overall reasonably controlled Continue current medications She will see cardiology in just over a month to discuss her medications CMP yesterday reviewed

## 2018-07-11 NOTE — Assessment & Plan Note (Signed)
Sees cardiology the beginning of March In sinus rhythm possibly PACs Taking Eliquis On metoprolol and diltiazem ?  Diltiazem causing edema-she will discuss with Dr. Stanford Breed

## 2018-07-12 DIAGNOSIS — Z79899 Other long term (current) drug therapy: Secondary | ICD-10-CM | POA: Diagnosis not present

## 2018-07-12 DIAGNOSIS — F329 Major depressive disorder, single episode, unspecified: Secondary | ICD-10-CM | POA: Diagnosis not present

## 2018-07-12 DIAGNOSIS — M316 Other giant cell arteritis: Secondary | ICD-10-CM | POA: Diagnosis not present

## 2018-07-12 DIAGNOSIS — G4700 Insomnia, unspecified: Secondary | ICD-10-CM | POA: Diagnosis not present

## 2018-07-12 DIAGNOSIS — F419 Anxiety disorder, unspecified: Secondary | ICD-10-CM | POA: Diagnosis not present

## 2018-07-12 DIAGNOSIS — M25472 Effusion, left ankle: Secondary | ICD-10-CM | POA: Diagnosis not present

## 2018-07-12 DIAGNOSIS — I10 Essential (primary) hypertension: Secondary | ICD-10-CM | POA: Diagnosis not present

## 2018-07-12 DIAGNOSIS — Z7901 Long term (current) use of anticoagulants: Secondary | ICD-10-CM | POA: Diagnosis not present

## 2018-07-12 DIAGNOSIS — H543 Unqualified visual loss, both eyes: Secondary | ICD-10-CM | POA: Diagnosis not present

## 2018-07-12 DIAGNOSIS — M25471 Effusion, right ankle: Secondary | ICD-10-CM | POA: Diagnosis not present

## 2018-07-12 DIAGNOSIS — I4891 Unspecified atrial fibrillation: Secondary | ICD-10-CM | POA: Diagnosis not present

## 2018-07-12 LAB — URINE CULTURE: Culture: 60000 — AB

## 2018-07-13 ENCOUNTER — Telehealth: Payer: Self-pay | Admitting: *Deleted

## 2018-07-13 ENCOUNTER — Telehealth: Payer: Self-pay

## 2018-07-13 DIAGNOSIS — N289 Disorder of kidney and ureter, unspecified: Secondary | ICD-10-CM

## 2018-07-13 NOTE — Telephone Encounter (Signed)
Referral ordered

## 2018-07-13 NOTE — Telephone Encounter (Signed)
Copied from [place] [id number]. Topic: General - Inquiry >> Jul 13, 2018 11:29 AM Conception Chancy, NT wrote: Reason for CRM: patient is requesting a kidney specialist referral.

## 2018-07-13 NOTE — Telephone Encounter (Signed)
Post ED Visit - Positive Culture Follow-up  Culture report reviewed by antimicrobial stewardship pharmacist:  []  Elenor Quinones, Pharm.D. []  Heide Guile, Pharm.D., BCPS AQ-ID []  Parks Neptune, Pharm.D., BCPS []  Alycia Rossetti, Pharm.D., BCPS []  [place], [place].D., BCPS, AAHIVP []  Legrand Como, Pharm.D., BCPS, AAHIVP [x]  Salome Arnt, PharmD, BCPS []  Johnnette Gourd, PharmD, BCPS []  Hughes Better, PharmD, BCPS []  Leeroy Cha, PharmD  Positive urine culture, reviewed by Ocie Cornfield, PA-C No further patient follow-up is required at this time.  Harlon Flor [facility] 07/13/2018, 10:17 AM

## 2018-07-14 ENCOUNTER — Other Ambulatory Visit: Payer: Self-pay | Admitting: Internal Medicine

## 2018-07-14 NOTE — Telephone Encounter (Signed)
Last OV 07/11/2018  [place] Controlled Substance Database checked. Last filled on 06/15/2018

## 2018-07-14 NOTE — Telephone Encounter (Signed)
Husband is aware.

## 2018-07-18 ENCOUNTER — Telehealth: Payer: Self-pay | Admitting: Emergency Medicine

## 2018-07-18 ENCOUNTER — Telehealth: Payer: Self-pay | Admitting: *Deleted

## 2018-07-18 DIAGNOSIS — M4856XA Collapsed vertebra, not elsewhere classified, lumbar region, initial encounter for fracture: Secondary | ICD-10-CM | POA: Diagnosis not present

## 2018-07-18 DIAGNOSIS — M545 Low back pain: Secondary | ICD-10-CM | POA: Diagnosis not present

## 2018-07-18 NOTE — Telephone Encounter (Signed)
Pts spouse dropped off [facility] form to be filled out and signed. Placed in [place] box.

## 2018-07-18 NOTE — Telephone Encounter (Signed)
   [facility] Pre-operative Risk Assessment    Request for surgical clearance:  1. What type of surgery is being performed? L4 KYPHOPLASTY    2. When is this surgery scheduled? TBD   3. What type of clearance is required (medical clearance vs. Pharmacy clearance to hold med vs. Both)? BOTH   4. Are there any medications that need to be held prior to surgery and how long?ELIQUIS    5. Practice name and name of physician performing surgery? [facility]    6. What is your office phone number? [phone]    7.   What is your office fax number? [place]   Anesthesia type (None, local, MAC, general) ? GENERAL VS LOCAL AND SEDATION TBD

## 2018-07-19 NOTE — Telephone Encounter (Signed)
Form has been completed & signed by provider. Faxed to [facility] @ [phone], Copy sent to scan.   Spoke with spouse and informed them it has been completed.

## 2018-07-21 NOTE — Telephone Encounter (Signed)
Patient takes Eliquis for afib with CHADS2VASc score of (age, sex, HTN, PAD), also with chronic right SFA occlusion, noted 08/2017 when patient first went into afib. She also underwent DCCV on 06/16/18 and is now outside her 4 weeks of post cardioversion uninterrupted anticoagulation. Renal function is normal. Ok to hold Eliquis for 3 days prior to spinal procedure per protocol.

## 2018-07-21 NOTE — Telephone Encounter (Signed)
Would have her seen by PA and make sure she is in sinus; if so, ok for surgery; hold apixaban 2 days prior to surgery and resume after when ok with surgery [name]

## 2018-07-24 ENCOUNTER — Ambulatory Visit: Payer: Self-pay | Admitting: Orthopedic Surgery

## 2018-07-24 NOTE — Telephone Encounter (Signed)
Spoke with patient appt made to see [facility] 08/01/2018. Patient agreeable with appt.

## 2018-07-31 ENCOUNTER — Ambulatory Visit: Payer: Self-pay | Admitting: Orthopedic Surgery

## 2018-07-31 DIAGNOSIS — Z6841 Body Mass Index (BMI) 40.0 and over, adult: Secondary | ICD-10-CM | POA: Diagnosis not present

## 2018-07-31 DIAGNOSIS — M545 Low back pain: Secondary | ICD-10-CM | POA: Diagnosis not present

## 2018-07-31 NOTE — H&P (Signed)
Subjective:   Crystal [name] is a pleasant 73 year old female Past medical history significant for asthma, high cholesterol, irregular heartbeats scheduled for an L4 kyphoplasty on 08/09/18/20 with Dr. Rolena Infante at [facility] to treat her L4 compression fracture. She has obtained preoperative clearance from PCP and cardiologist. She has her appointment scheduled I [facility] for 3 operative exam.  Patient Active Problem List   Diagnosis Date Noted  . Long term (current) use of anticoagulants 08/01/2018  . Pre-operative clearance 08/01/2018  . Closed compression fracture of L4 vertebra ([place]) 07/11/2018  . Bilateral leg edema 07/11/2018  . Lung nodule < 6cm on CT 07/11/2018  . Subclinical hypothyroidism 04/11/2018  . Fracture of femoral neck, right ([place]) 03/22/2018  . PAF (paroxysmal atrial fibrillation) ([place]) 03/18/2018  . 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 Herpes zoster without complication 21/30/8657  . Jaw claudication 07/27/2017  . Temporal arteritis ([place]) 07/18/2017  . Vision, loss, sudden, bilateral 07/18/2017  . 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 Osteoarthritis 06/25/2016  . Anxiety 06/25/2016  . Back pain 07/18/2015  . 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 Carotid bruit present 06/05/2010  . Osteoporosis 06/03/2008  . History of colonic polyps 06/03/2008  . INTENTION TREMOR 12/06/2007  . IBS 10/16/2007  . Hyperlipidemia 06/06/2007  . Essential hypertension 06/06/2007  . Chronic obstructive airway disease with asthma ([place]) 02/23/2007   Past Medical History:  Diagnosis Date  . Adenomatous colon polyp 2007 & 2010    Dr [name]  . Allergy    seasonal  . Arthritis   . Cataract   . Diverticulosis   . 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 Hyperlipidemia   . Hypertension   . IBS (irritable bowel syndrome)   . Legally blind   . MVP (mitral valve prolapse)   . Paroxysmal atrial fibrillation with rapid ventricular response ([place]) 08/29/2017  .  Peripheral vascular disease ([place])   . RAD (reactive airway disease)     Past Surgical History:  Procedure Laterality Date  . ABDOMINAL HYSTERECTOMY     with USO for dysfunctionall menses  . APPENDECTOMY    . ARTERY BIOPSY Bilateral 07/21/2017   Procedure: BIOPSY BILATERAL TEMPORAL ARTERY;  Surgeon: [name], MD;  Location: [place];  Service: Vascular;  Laterality: Bilateral;  . CARDIOVERSION N/A 06/16/2018   Procedure: CARDIOVERSION;  Surgeon: [name], MD;  Location: [place];  Service: Cardiovascular;  Laterality: N/A;  . CATARACT EXTRACTION W/ INTRAOCULAR LENS IMPLANT  2013   bilateral; Dr [name]  . COLONOSCOPY  2013   negative , due 2018; Dr [name]  . COLONOSCOPY W/ POLYPECTOMY      X2 ; diverticulosis  . EMBOLECTOMY Right 08/29/2017   Procedure: Right Groin Exploration  ;  Surgeon: Rosetta Posner, MD;  Location: [place];  Service: Vascular;  Laterality: Right;  . HIP PINNING,CANNULATED Right 03/21/2018  . HIP PINNING,CANNULATED Right 03/21/2018   Procedure: CANNULATED HIP PINNING;  Surgeon: Rod Can, MD;  Location: [place];  Service: Orthopedics;  Laterality: Right;  . LUMBAR LAMINECTOMY  2000   Dr Vertell Limber  . SHOULDER SURGERY     R shoulder  . TONSILLECTOMY      Current Outpatient Medications  Medication Sig Dispense Refill Last Dose  . albuterol (PROVENTIL HFA;VENTOLIN HFA) 108 (90 Base) MCG/ACT inhaler INHALE 1-2  PUFFS INTO THE LUNGS EVERY 6 HOURS AS NEEDED FOR SHORTNESS OF BREATH AND WHEEZING (Patient taking differently: Inhale 1-2 puffs into the lungs every 6 (six) hours as needed  for wheezing or shortness of breath. INHALE 1-2  PUFFS INTO THE LUNGS EVERY 6 HOURS AS NEEDED FOR SHORTNESS OF BREATH AND WHEEZING) 8.5 each 2 08/04/2018 at Unknown time  . alendronate (FOSAMAX) 70 MG tablet Take 70 mg by mouth every Monday.    07/31/2018 at Unknown time  . amiodarone (PACERONE) 200 MG tablet Take 1 tablet (200 mg total) by mouth daily. (Patient not taking:  Reported on 08/05/2018) 30 tablet 6 Not Taking at Unknown time  . apixaban (ELIQUIS) 5 MG TABS tablet Take 1 tablet (5 mg total) by mouth 2 (two) times daily. 60 tablet 6 08/04/2018 at 0800  . Calcium Citrate-Vitamin D (CALCIUM + D PO) Take 1 tablet by mouth daily.   08/04/2018 at Unknown time  . Cholecalciferol (VITAMIN D3) 1000 units CAPS Take 1 capsule (1,000 Units total) by mouth daily with lunch. 60 capsule 0 08/04/2018 at Unknown time  . diltiazem (CARDIZEM CD) 120 MG 24 hr capsule Take 120 mg by mouth daily.   08/04/2018 at Unknown time  . furosemide (LASIX) 40 MG tablet TAKE ONE TABLET BY MOUTH DAILY (Patient taking differently: Take 40 mg by mouth daily. ) 90 tablet 2 08/04/2018 at Unknown time  . gabapentin (NEURONTIN) 100 MG capsule Take 5 capsules (500 mg total) by mouth at bedtime. 30 capsule 0 08/03/2018 at Unknown time  . LORazepam (ATIVAN) 1 MG tablet Take 1 tablet (1 mg total) by mouth at bedtime. 30 tablet 0   . methocarbamol (ROBAXIN) 500 MG tablet Take 1 tablet (500 mg total) by mouth every 6 (six) hours as needed for muscle spasms. (Patient not taking: Reported on 08/05/2018) 120 tablet 3 Completed Course at Unknown time  . metoprolol tartrate (LOPRESSOR) 25 MG tablet Take 1 tablet (25 mg total) by mouth 2 (two) times daily. 60 tablet 0 08/04/2018 at 0800  . montelukast (SINGULAIR) 10 MG tablet TAKE ONE TABLET BY MOUTH DAILY (Patient taking differently: Take 10 mg by mouth at bedtime. ) 72 tablet 0 08/03/2018 at Unknown time  . oxyCODONE-acetaminophen (PERCOCET/ROXICET) 5-325 MG tablet Take 1-2 tablets by mouth every 6 (six) hours as needed for severe pain. (Patient not taking: Reported on 08/05/2018) 20 tablet 0 Completed Course at Unknown time  . potassium chloride SA (K-DUR,KLOR-CON) 20 MEQ tablet TAKE TWO TABLETS BY MOUTH DAILY (Patient taking differently: Take 40 mEq by mouth daily. ) 180 tablet 1 08/04/2018 at Unknown time  . potassium chloride SA (K-DUR,KLOR-CON) 20 MEQ tablet Take 2  tablets (40 mEq total) by mouth 2 (two) times daily for 7 days. 28 tablet 0   . predniSONE (DELTASONE) 5 MG tablet Take 5 mg by mouth daily with breakfast.    08/04/2018 at Unknown time  . SYMBICORT 160-4.5 MCG/ACT inhaler INHALE 1 OR 2 PUFFS INTO THE LUNGS EVERY 12 HOURS. THEN GARGLE AND SPIT AFTER USE (Patient taking differently: Inhale 2 puffs into the lungs daily. ) 6 g 10 08/04/2018 at Unknown time  . theophylline (UNIPHYL) 400 MG 24 hr tablet TAKE 1 TABLET (400 MG TOTAL) BY MOUTH DAILY. (Patient taking differently: Take 200 mg by mouth 2 (two) times daily. ) 90 tablet 2 08/04/2018 at Unknown time  . traMADol (ULTRAM) 50 MG tablet Take 1 tablet (50 mg total) by mouth every 6 (six) hours as needed for moderate pain. 60 tablet 0 08/04/2018 at Unknown time   No current facility-administered medications for this visit.    Allergies  Allergen Reactions  . Simvastatin     myalgias  Social History   Tobacco Use  . Smoking status: Former Smoker    Packs/day: 1.00    Years: 23.00    Pack years: 23.00    Last attempt to quit: 06/14/1989    Years since quitting: 29.1  . Smokeless tobacco: Never Used  . Tobacco comment: smoked 1961-1991 , up to < 1 ppd  Substance Use Topics  . Alcohol use: Yes    Alcohol/week: 14.0 standard drinks    Types: 14 Glasses of wine per week    Comment: Wine    Family History  Problem Relation Age of Onset  . Heart attack Brother 59  . Parkinsonism Mother   . Stroke Mother   . Heart attack Father        pre 36  . Diabetes Sister   . COPD Sister        X 3  . Other Son        suicide 2016  . Colon cancer Neg Hx   . Cancer Neg Hx   . Esophageal cancer Neg Hx   . Liver cancer Neg Hx   . Pancreatic cancer Neg Hx   . Rectal cancer Neg Hx   . Stomach cancer Neg Hx      Objective:   Vitals:  Ht: 5 ft 3 in 07/31/2018 02:10 pm  Wt: 113 lbs 07/31/2018 02:10 pm  BMI: 20 07/31/2018 02:10 pm  BP: 146/57 sitting R arm 07/31/2018 02:13 pm  Pulse: 96 bpm  07/31/2018 02:13 pm  T: 98.3 F oral 07/31/2018 02:13 pm General: AAOX3, well developed and well nourished, NAD Ambulation Normal uses no assitive devices Heart: RRR, no rubs, murmers, or gallops. Lungs: CTAB Abdomen: Normal bowel soundsX4, soft, non-tender, no hepatosplenomegaly. Palpation: TTP over L4 spinous process  ROM: -Spine: normal ROM, pain elicited with fwd flexion and extension. - Knee: flexion and extension normal and pain free bilaterally. - Ankle: Dorsiflexion, plantarflexion, inversion, eversion normal and pain free.  Dermatomes: Lower extremity sensation to light touch intact bilaterally with positive dysathesias on the left.  Myotomes: - Hip Flexion: Left 5/5, Right 5/5 -Hip Adduction: Left 5/5, Right 5/5 - Knee Extension: Left 5/5, Right 5/5 - Knee Flexion: Left 5/5, Right 5/5 - Ankle Dorsiflextion: Left 5/5, Right 5/5 Ankle Eversion: Left 5/5, Right 5/5 - Ankle Plantarflexion: Left 5/5, Right 5/5  Reflexes: - Patella: Left2+, Right 2+ - Achilles: Left2+, Right 2+ - Babinski: Left Ngative, Right Negative - Clonus: Negative  PV: Extremities warm and well profused. Posterior and dorsalis pedis pulse 2+ bilaterally, No pitting Edema, discoloration, calf tenderness, or palpable cords. Homan's negative bilaterally.   X-Ray impression: Mild scoliosis, L4 compression fx  Assessment:   Crystal [name] is a pleasant 73 year old female with past medical history of asthma, irregular heartbeat, high cholesterol who is scheduled for an L4 kyphoplasty on 08/09/18/20 for treatment of her L4 compression fx at [facility] with Dr. Rolena Infante. She has no focal motor deficits.  Plan:   L4 Kyphoplasty  Risks of surgery include: Infection, bleeding, death, stroke, paralysis, nerve damage, leak of cement, need for additional surgery including open decompression. Ongoing or worse pain.  Goals of surgery: Reduction in pain, and improvement in quality of life  All the patient's  questions were invited and answered.   Return to Office [name], MD for 5-Surgery at [facility] OP OBS on 08/09/2018 at 09:30 AM [name], MD for 5-Post-Op at [facility] on 08/22/2018 at 10:15 AM

## 2018-07-31 NOTE — H&P (Signed)
Subjective:   Crystal [name] is a pleasant 73 year old female Past medical history significant for asthma, high cholesterol, irregular heartbeats scheduled for an L4 kyphoplasty on 08/09/18/20 with Dr. Rolena Infante at [facility] to treat her L4 compression fracture. She has obtained preoperative clearance from PCP and cardiologist. She has her appointment scheduled I [facility] for preoperative exam.  Patient Active Problem List   Diagnosis Date Noted  . Closed compression fracture of L4 vertebra ([place]) 07/11/2018  . Bilateral leg edema 07/11/2018  . Lung nodule < 6cm on CT 07/11/2018  . Subclinical hypothyroidism 04/11/2018  . Fracture of femoral neck, right ([place]) 03/22/2018  . Atrial fibrillation ([place]) 03/18/2018  . 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 Paroxysmal atrial fibrillation ([facility])   . Herpes zoster without complication 40/03/2724  . Jaw claudication 07/27/2017  . Temporal arteritis ([place]) 07/18/2017  . Vision, loss, sudden, bilateral 07/18/2017  . 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 Osteoarthritis 06/25/2016  . Anxiety 06/25/2016  . Back pain 07/18/2015  . 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 Osteoporosis 06/03/2008  . History of colonic polyps 06/03/2008  . INTENTION TREMOR 12/06/2007  . IBS 10/16/2007  . Hyperlipidemia 06/06/2007  . Essential hypertension 06/06/2007  . Chronic obstructive airway disease with asthma ([place]) 02/23/2007   Past Medical History:  Diagnosis Date  . Adenomatous colon polyp 2007 & 2010    Dr [name]  . Allergy    seasonal  . Arthritis   . Cataract   . Diverticulosis   . 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 Hyperlipidemia   . Hypertension   . IBS (irritable bowel syndrome)   . Legally blind   . MVP (mitral valve prolapse)   . Paroxysmal atrial fibrillation with rapid ventricular response ([place]) 08/29/2017  . Peripheral vascular disease ([place])   . RAD (reactive airway disease)      Past Surgical History:  Procedure Laterality Date  . ABDOMINAL HYSTERECTOMY     with USO for dysfunctionall menses  . APPENDECTOMY    . ARTERY BIOPSY Bilateral 07/21/2017   Procedure: BIOPSY BILATERAL TEMPORAL ARTERY;  Surgeon: [name], MD;  Location: [place];  Service: Vascular;  Laterality: Bilateral;  . CARDIOVERSION N/A 06/16/2018   Procedure: CARDIOVERSION;  Surgeon: [name], MD;  Location: [place];  Service: Cardiovascular;  Laterality: N/A;  . CATARACT EXTRACTION W/ INTRAOCULAR LENS IMPLANT  2013   bilateral; Dr [name]  . COLONOSCOPY  2013   negative , due 2018; Dr [name]  . COLONOSCOPY W/ POLYPECTOMY      X2 ; diverticulosis  . EMBOLECTOMY Right 08/29/2017   Procedure: Right Groin Exploration  ;  Surgeon: Rosetta Posner, MD;  Location: [place];  Service: Vascular;  Laterality: Right;  . HIP PINNING,CANNULATED Right 03/21/2018  . HIP PINNING,CANNULATED Right 03/21/2018   Procedure: CANNULATED HIP PINNING;  Surgeon: Rod Can, MD;  Location: [place];  Service: Orthopedics;  Laterality: Right;  . LUMBAR LAMINECTOMY  2000   Dr Vertell Limber  . SHOULDER SURGERY     R shoulder  . TONSILLECTOMY      Current Outpatient Medications  Medication Sig Dispense Refill Last Dose  . albuterol (PROVENTIL HFA;VENTOLIN HFA) 108 (90 Base) MCG/ACT inhaler INHALE 1-2  PUFFS INTO THE LUNGS EVERY 6 HOURS AS NEEDED FOR SHORTNESS OF BREATH AND WHEEZING (Patient taking differently: Inhale 1-2 puffs into the lungs every 6 (six) hours as needed for wheezing or shortness of breath. INHALE 1-2  PUFFS  INTO THE LUNGS EVERY 6 HOURS AS NEEDED FOR SHORTNESS OF BREATH AND WHEEZING) 8.5 each 2 Taking  . alendronate (FOSAMAX) 70 MG tablet Take 70 mg by mouth every Monday.    Taking  . amiodarone (PACERONE) 200 MG tablet Take 1 tablet (200 mg total) by mouth daily. 30 tablet 6 Taking  . apixaban (ELIQUIS) 5 MG TABS tablet Take 1 tablet (5 mg total) by mouth 2 (two) times daily. 60 tablet 6 Taking  .  Calcium Citrate-Vitamin D (CALCIUM + D PO) Take 1 tablet by mouth daily.   Taking  . Cholecalciferol (VITAMIN D3) 1000 units CAPS Take 1 capsule (1,000 Units total) by mouth daily with lunch. 60 capsule 0 Taking  . diltiazem (CARDIZEM CD) 120 MG 24 hr capsule Take 120 mg by mouth daily.   Taking  . furosemide (LASIX) 40 MG tablet TAKE ONE TABLET BY MOUTH DAILY (Patient taking differently: Take 40 mg by mouth daily. ) 90 tablet 2 Taking  . gabapentin (NEURONTIN) 100 MG capsule Take 5 capsules (500 mg total) by mouth at bedtime. 30 capsule 0 Taking  . LORazepam (ATIVAN) 1 MG tablet TAKE ONE TABLET BY MOUTH AT BEDTIME (Patient taking differently: Take 1 mg by mouth at bedtime. ) 30 tablet 0   . methocarbamol (ROBAXIN) 500 MG tablet Take 1 tablet (500 mg total) by mouth every 6 (six) hours as needed for muscle spasms. 120 tablet 3   . metoprolol tartrate (LOPRESSOR) 25 MG tablet Take 1 tablet (25 mg total) by mouth 2 (two) times daily. 60 tablet 0 Taking  . metoprolol tartrate (LOPRESSOR) 50 MG tablet Take 25 mg by mouth 2 (two) times daily as needed (for Afib).    Taking  . montelukast (SINGULAIR) 10 MG tablet TAKE ONE TABLET BY MOUTH DAILY (Patient taking differently: Take 10 mg by mouth at bedtime. ) 72 tablet 0 Taking  . oxyCODONE-acetaminophen (PERCOCET/ROXICET) 5-325 MG tablet Take 1-2 tablets by mouth every 6 (six) hours as needed for severe pain. 20 tablet 0 Taking  . potassium chloride SA (K-DUR,KLOR-CON) 20 MEQ tablet TAKE TWO TABLETS BY MOUTH DAILY (Patient taking differently: Take 40 mEq by mouth daily. ) 180 tablet 1 Taking  . predniSONE (DELTASONE) 5 MG tablet Take 5 mg by mouth daily with breakfast.    Taking  . SYMBICORT 160-4.5 MCG/ACT inhaler INHALE 1 OR 2 PUFFS INTO THE LUNGS EVERY 12 HOURS. THEN GARGLE AND SPIT AFTER USE (Patient taking differently: Inhale 2 puffs into the lungs daily. ) 6 g 10 Taking  . theophylline (UNIPHYL) 400 MG 24 hr tablet TAKE 1 TABLET (400 MG TOTAL) BY MOUTH  DAILY. (Patient taking differently: Take 200 mg by mouth 2 (two) times daily. ) 90 tablet 2 Taking  . traMADol (ULTRAM) 50 MG tablet Take 1 tablet (50 mg total) by mouth every 6 (six) hours as needed for moderate pain. 60 tablet 0 Taking   No current facility-administered medications for this visit.    Allergies  Allergen Reactions  . Simvastatin     myalgias    Social History   Tobacco Use  . Smoking status: Former Smoker    Packs/day: 1.00    Years: 23.00    Pack years: 23.00    Last attempt to quit: 06/14/1989    Years since quitting: 29.1  . Smokeless tobacco: Never Used  . Tobacco comment: smoked 1961-1991 , up to < 1 ppd  Substance Use Topics  . Alcohol use: Yes    Alcohol/week: 14.0  standard drinks    Types: 14 Glasses of wine per week    Comment: Wine    Family History  Problem Relation Age of Onset  . Heart attack Brother 26  . Parkinsonism Mother   . Stroke Mother   . Heart attack Father        pre 43  . Diabetes Sister   . COPD Sister        X 3  . Other Son        suicide 2016  . Colon cancer Neg Hx   . Cancer Neg Hx   . Esophageal cancer Neg Hx   . Liver cancer Neg Hx   . Pancreatic cancer Neg Hx   . Rectal cancer Neg Hx   . Stomach cancer Neg Hx       Objective:   Vitals: - Ht: 5 ft 3 in 07/31/2018 02:10 pm - Wt: 113 lbs 07/31/2018 02:10 pm - BMI: 20 07/31/2018 02:10 pm - BP: 146/57 sitting R arm 07/31/2018 02:13 pm - Pulse: 96 bpm 07/31/2018 02:13 pm - T: 98.3 F oral 07/31/2018 02:13 pm General: AAOX3, well developed and well nourished, NAD Ambulation: Normal uses no assitive devices Heart: RRR, no rubs, murmers, or gallops. Lungs: CTAB Abdomen: Normal bowel soundsX4, soft, non-tender, no hepatosplenomegaly.  Palpation: TTP over L4 spinous process ROM: -Spine: normal ROM, pain elicited with fwd flexion and extension. - Knee: flexion and extension normal and pain free bilaterally. - Ankle: Dorsiflexion, plantarflexion, inversion,  eversion normal and pain free.  Dermatomes: Lower extremity sensation to light touch intact bilaterally with positive dysathesias on the left.  Myotomes: - Hip Flexion: Left 5/5, Right 5/5 -Hip Adduction: Left 5/5, Right 5/5 - Knee Extension: Left 5/5, Right 5/5 - Knee Flexion: Left 5/5, Right 5/5 - Ankle Dorsiflextion: Left 5/5, Right 5/5 - Ankle Eversion: Left 5/5, Right 5/5 - Ankle Plantarflexion: Left 5/5, Right 5/5  Reflexes: - Patella: Left2+, Right 2+ - Achilles: Left2+, Right 2+ - Babinski: Left Ngative, Right Negative - Clonus: Negative  PV: Extremities warm and well profused. Posterior and dorsalis pedis pulse 2+ bilaterally, No pitting Edema, discoloration, calf tenderness, or palpable cords. Homan's negative bilaterally.   X-Ray impression: Mild scoliosis, L4 compression fracture with loss of height.   Assessment:   Crystal [name] is a pleasant 73 year old female with past medical history of asthma, irregular heartbeat, high cholesterol who is scheduled for an L4 kyphoplasty on 08/09/18/20 for treatment of her L4 compression fx at [facility] with Dr. Rolena Infante. She has no focal motor deficits.  Plan:    Risks of surgery include: Infection, bleeding, death, stroke, paralysis, nerve damage, leak of cement, need for additional surgery including open decompression. Ongoing or worse pain.  Goals of surgery: Reduction in pain, and improvement in quality of life  All the patient's questions were invited and answered.  Follow-up: [name], MD for 5-Post-Op at [facility] on 08/22/2018 at 10:15 AM

## 2018-08-01 ENCOUNTER — Ambulatory Visit: Payer: Medicare Other | Admitting: Cardiology

## 2018-08-01 ENCOUNTER — Encounter: Payer: Self-pay | Admitting: Cardiology

## 2018-08-01 VITALS — BP 154/76 | HR 90 | Ht 64.0 in | Wt 118.8 lb

## 2018-08-01 DIAGNOSIS — Z7901 Long term (current) use of anticoagulants: Secondary | ICD-10-CM | POA: Diagnosis not present

## 2018-08-01 DIAGNOSIS — M316 Other giant cell arteritis: Secondary | ICD-10-CM

## 2018-08-01 DIAGNOSIS — Z01818 Encounter for other preprocedural examination: Secondary | ICD-10-CM | POA: Insufficient documentation

## 2018-08-01 DIAGNOSIS — R0989 Other specified symptoms and signs involving the circulatory and respiratory systems: Secondary | ICD-10-CM | POA: Diagnosis not present

## 2018-08-01 DIAGNOSIS — I10 Essential (primary) hypertension: Secondary | ICD-10-CM

## 2018-08-01 NOTE — Assessment & Plan Note (Addendum)
Holding NSR after DCCV Jan 3d 2020

## 2018-08-01 NOTE — Assessment & Plan Note (Signed)
Will arrange for dopplers

## 2018-08-01 NOTE — Assessment & Plan Note (Signed)
Fair control.

## 2018-08-01 NOTE — Assessment & Plan Note (Signed)
CHADS VASC=4 

## 2018-08-01 NOTE — Patient Instructions (Addendum)
Medication Instructions:  Your physician recommends that you continue on your current medications as directed. Please refer to the Current Medication list given to you today.  If you need a refill on your cardiac medications before your next appointment, please call your pharmacy.   Lab work: None  If you have labs (blood work) drawn today and your tests are completely normal, you will receive your results only by: Marland Kitchen MyChart Message (if you have MyChart) OR . A paper copy in the mail If you have any lab test that is abnormal or we need to change your treatment, we will call you to review the results.  Testing/Procedures: Your physician has requested that you have a carotid duplex. This test is an ultrasound of the carotid arteries in your neck. It looks at blood flow through these arteries that supply the brain with blood. Allow one hour for this exam. There are no restrictions or special instructions. SCHEDULE TEST FOR 4-6 WEEKS   Follow-Up: At [facility], you and your health needs are our priority.  As part of our continuing mission to provide you with exceptional heart care, we have created designated Provider Care Teams.  These Care Teams include your primary Cardiologist (physician) and Advanced Practice Providers (APPs -  Physician Assistants and Nurse Practitioners) who all work together to provide you with the care you need, when you need it. You will need a follow up appointment in 2 months. You may see [name], MD or one of the following Advanced Practice Providers on your designated Care Team:   [name], PA-C Roby Lofts, [place] . Sande Rives, PA-C  Any Other Special Instructions Will Be Listed Below (If Applicable).

## 2018-08-01 NOTE — Pre-Procedure Instructions (Signed)
Crystal [name]  08/01/2018       Your procedure is scheduled on August 09, 2018.  [facility] to [facility] Admitting at 07:30 A.M.  Call this number if you have problems the morning of surgery:  [phone]   Remember:  Do not eat or drink after midnight.     Take these medicines the morning of surgery with A SIP OF WATER : Amiodarone (Pacerone) Diltiazem (Cardizem) Metoprolol (Lopressor) Prednisone (Deltasone) Symbicort Inhaler Theophylline (Uniphyl)  If needed: Methocarbamol (Robaxin) Albuterol (Proventil HFA)--bring with you to the hospital Oxycodone-Acetaminophen (Percocet) Tramadol (Ultram)  HOLD your apixaban (Eliquis) 3 days prior to your surgery per your Doctor.  7 days prior to surgery STOP taking any Aspirin (unless otherwise instructed by your surgeon), Aleve, Naproxen, Ibuprofen, Motrin, Advil, Goody's, BC's, all herbal medications, fish oil, and all vitamins.     Do not wear jewelry, make-up or nail polish.  Do not wear lotions, powders, or perfumes, or deodorant.  Do not shave 48 hours prior to surgery.   Do not bring valuables to the hospital.  [facility] is not responsible for any belongings or valuables.  Contacts, dentures or bridgework may not be worn into surgery.  Leave your suitcase in the car.  After surgery it may be brought to your room.  For patients admitted to the hospital, discharge time will be determined by your treatment team.  Patients discharged the day of surgery will not be allowed to drive home.   Special instructions:   [place]- Preparing For Surgery  Before surgery, you can play an important role. Because skin is not sterile, your skin needs to be as free of germs as possible. You can reduce the number of germs on your skin by washing with CHG (chlorahexidine gluconate) Soap before surgery.  CHG is an antiseptic cleaner which kills germs and bonds with the skin to continue killing germs even after washing.     Oral Hygiene is also important to reduce your risk of infection.  Remember - BRUSH YOUR TEETH THE MORNING OF SURGERY WITH YOUR REGULAR TOOTHPASTE  Please do not use if you have an allergy to CHG or antibacterial soaps. If your skin becomes reddened/irritated stop using the CHG.  Do not shave (including legs and underarms) for at least 48 hours prior to first CHG shower. It is OK to shave your face.  Please follow these instructions carefully.   1. Shower the NIGHT BEFORE SURGERY and the MORNING OF SURGERY with CHG.   2. If you chose to wash your hair, wash your hair first as usual with your normal shampoo.  3. After you shampoo, rinse your hair and body thoroughly to remove the shampoo.  4. Use CHG as you would any other liquid soap. You can apply CHG directly to the skin and wash gently with a scrungie or a clean washcloth.   5. Apply the CHG Soap to your body ONLY FROM THE NECK DOWN.  Do not use on open wounds or open sores. Avoid contact with your eyes, ears, mouth and genitals (private parts). Wash Face and genitals (private parts)  with your normal soap.  6. Wash thoroughly, paying special attention to the area where your surgery will be performed.  7. Thoroughly rinse your body with warm water from the neck down.  8. DO NOT shower/wash with your normal soap after using and rinsing off the CHG Soap.  9. Pat yourself dry with a CLEAN TOWEL.  10.  Wear CLEAN PAJAMAS to bed the night before surgery, wear comfortable clothes the morning of surgery  11. Place CLEAN SHEETS on your bed the night of your first shower and DO NOT SLEEP WITH PETS.    Day of Surgery:  Do not apply any deodorants/lotions.  Please wear clean clothes to the hospital/surgery center.   Remember to brush your teeth WITH YOUR REGULAR TOOTHPASTE.    Please read over the following fact sheets that you were given.

## 2018-08-01 NOTE — Telephone Encounter (Signed)
   Primary Cardiologist: [name], MD  Chart reviewed and patient seen in the office as part of pre-operative protocol coverage. Given past medical history and time since last visit, based on ACC/AHA guidelines, Crystal [name] would be at acceptable risk for the planned procedure without further cardiovascular testing.   OK to hold Eliquis 3 days pre op-resume as soon as possible post op.   I will route this recommendation to the requesting party via Epic fax function and remove from pre-op pool.  Please call with questions.  [name], PA-C 08/01/2018, 11:55 AM

## 2018-08-01 NOTE — Assessment & Plan Note (Signed)
Discussed with Dr Gwenlyn Found in the office today- (Dr Stanford Breed is on vacation) Hold Eliquis 3 days pre op- resume as soon as possible post op

## 2018-08-01 NOTE — Assessment & Plan Note (Signed)
07/2017 Bilateral vision loss

## 2018-08-01 NOTE — Progress Notes (Signed)
08/01/2018 Crystal [name]   [birth year]  [id number]  Primary Physician [name], MD Primary Cardiologist: dr Stanford Breed  HPI:  Pleasant 73 y/o femal seen in the office today for pre op clearance prior to back surgery.  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  In March 2019 she presented with leg pain and found to be in atrial fibrillation.  Ultrasound revealed thrombotic occlusion of her right SFA which appeared to be consistent with emboli. She was eventually taken to the OR on 08/29/2017 for right femoral exploration, and ultimately it was felt that a SFA occlusion was chronic.Post op the patient converted to sinus spontaneously.  The patient fell October 2019 and had a subcapital fracture of the right hip. She was noted to be in atrial fibrillation with rapid ventricular response at that time. She underwent repair of her hip fracture and was placed on amiodarone and Eliquis.  Echocardiogram November 2019 showed normal LV function, mild to moderate mitral regurgitation, moderate left atrial enlargement and mild to moderate tricuspid regurgitation.  On Jan 3d 2020 she underwent successful Op DCCV.  She has remained in NSR.  She now needs to have back surgery.  She has been given clearance to hold her Eliquis 3 days per pharmacy protocol (though Dr Jacalyn Lefevre not said two days).     Current Outpatient Medications  Medication Sig Dispense Refill  . albuterol (PROVENTIL HFA;VENTOLIN HFA) 108 (90 Base) MCG/ACT inhaler INHALE 1-2  PUFFS INTO THE LUNGS EVERY 6 HOURS AS NEEDED FOR SHORTNESS OF BREATH AND WHEEZING (Patient taking differently: Inhale 1-2 puffs into the lungs every 6 (six) hours as needed for wheezing or shortness of breath. INHALE 1-2  PUFFS INTO THE LUNGS EVERY 6 HOURS AS NEEDED FOR SHORTNESS OF BREATH AND WHEEZING) 8.5 each 2  . alendronate (FOSAMAX) 70 MG tablet Take 70 mg by mouth every Monday.     Marland Kitchen amiodarone (PACERONE) 200 MG tablet Take 1 tablet (200 mg  total) by mouth daily. 30 tablet 6  . apixaban (ELIQUIS) 5 MG TABS tablet Take 1 tablet (5 mg total) by mouth 2 (two) times daily. 60 tablet 6  . Calcium Citrate-Vitamin D (CALCIUM + D PO) Take 1 tablet by mouth daily.    . Cholecalciferol (VITAMIN D3) 1000 units CAPS Take 1 capsule (1,000 Units total) by mouth daily with lunch. 60 capsule 0  . diltiazem (CARDIZEM CD) 120 MG 24 hr capsule Take 120 mg by mouth daily.    . furosemide (LASIX) 40 MG tablet TAKE ONE TABLET BY MOUTH DAILY (Patient taking differently: Take 40 mg by mouth daily. ) 90 tablet 2  . gabapentin (NEURONTIN) 100 MG capsule Take 5 capsules (500 mg total) by mouth at bedtime. 30 capsule 0  . LORazepam (ATIVAN) 1 MG tablet TAKE ONE TABLET BY MOUTH AT BEDTIME (Patient taking differently: Take 1 mg by mouth at bedtime. ) 30 tablet 0  . methocarbamol (ROBAXIN) 500 MG tablet Take 1 tablet (500 mg total) by mouth every 6 (six) hours as needed for muscle spasms. 120 tablet 3  . metoprolol tartrate (LOPRESSOR) 25 MG tablet Take 1 tablet (25 mg total) by mouth 2 (two) times daily. 60 tablet 0  . metoprolol tartrate (LOPRESSOR) 50 MG tablet Take 25 mg by mouth 2 (two) times daily as needed (for Afib).     . montelukast (SINGULAIR) 10 MG tablet TAKE ONE TABLET BY MOUTH DAILY (Patient taking differently: Take 10 mg by mouth at  bedtime. ) 72 tablet 0  . oxyCODONE-acetaminophen (PERCOCET/ROXICET) 5-325 MG tablet Take 1-2 tablets by mouth every 6 (six) hours as needed for severe pain. 20 tablet 0  . potassium chloride SA (K-DUR,KLOR-CON) 20 MEQ tablet TAKE TWO TABLETS BY MOUTH DAILY (Patient taking differently: Take 40 mEq by mouth daily. ) 180 tablet 1  . predniSONE (DELTASONE) 5 MG tablet Take 5 mg by mouth daily with breakfast.     . SYMBICORT 160-4.5 MCG/ACT inhaler INHALE 1 OR 2 PUFFS INTO THE LUNGS EVERY 12 HOURS. THEN GARGLE AND SPIT AFTER USE (Patient taking differently: Inhale 2 puffs into the lungs daily. ) 6 g 10  . theophylline  (UNIPHYL) 400 MG 24 hr tablet TAKE 1 TABLET (400 MG TOTAL) BY MOUTH DAILY. (Patient taking differently: Take 200 mg by mouth 2 (two) times daily. ) 90 tablet 2  . traMADol (ULTRAM) 50 MG tablet Take 1 tablet (50 mg total) by mouth every 6 (six) hours as needed for moderate pain. 60 tablet 0   No current facility-administered medications for this visit.     Allergies  Allergen Reactions  . Simvastatin     myalgias    Past Medical History:  Diagnosis Date  . Adenomatous colon polyp 2007 & 2010    Dr [name]  . Allergy    seasonal  . Arthritis   . Cataract   . Diverticulosis   . 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 Hyperlipidemia   . Hypertension   . IBS (irritable bowel syndrome)   . Legally blind   . MVP (mitral valve prolapse)   . Paroxysmal atrial fibrillation with rapid ventricular response ([place]) 08/29/2017  . Peripheral vascular disease ([place])   . RAD (reactive airway disease)     Social History   Socioeconomic History  . Marital status: Married    Spouse name: Not on file  . Number of children: Not on file  . Years of education: Not on file  . Highest education level: Not on file  Occupational History  . Occupation: retired  Scientific laboratory technician  . Financial resource strain: Not on file  . Food insecurity:    Worry: Not on file    Inability: Not on file  . Transportation needs:    Medical: Not on file    Non-medical: Not on file  Tobacco Use  . Smoking status: Former Smoker    Packs/day: 1.00    Years: 23.00    Pack years: 23.00    Last attempt to quit: 06/14/1989    Years since quitting: 29.1  . Smokeless tobacco: Never Used  . Tobacco comment: smoked 1961-1991 , up to < 1 ppd  Substance and Sexual Activity  . Alcohol use: Yes    Alcohol/week: 14.0 standard drinks    Types: 14 Glasses of wine per week    Comment: Wine  . Drug use: No  . Sexual activity: Not on file  Lifestyle  . Physical activity:    Days per week: Not on file    Minutes per  session: Not on file  . Stress: Not on file  Relationships  . Social connections:    Talks on phone: Not on file    Gets together: Not on file    Attends religious service: Not on file    Active member of club or organization: Not on file    Attends meetings of clubs or organizations: Not on file    Relationship status: Not on file  . Intimate  partner violence:    Fear of current or ex partner: Not on file    Emotionally abused: Not on file    Physically abused: Not on file    Forced sexual activity: Not on file  Other Topics Concern  . Not on file  Social History Narrative  . Not on file     Family History  Problem Relation Age of Onset  . Heart attack Brother 9  . Parkinsonism Mother   . Stroke Mother   . Heart attack Father        pre 64  . Diabetes Sister   . COPD Sister        X 3  . Other Son        suicide 2016  . Colon cancer Neg Hx   . Cancer Neg Hx   . Esophageal cancer Neg Hx   . Liver cancer Neg Hx   . Pancreatic cancer Neg Hx   . Rectal cancer Neg Hx   . Stomach cancer Neg Hx      Review of Systems: General: negative for chills, fever, night sweats or weight changes.  Cardiovascular: negative for chest pain, dyspnea on exertion, edema, orthopnea, palpitations, paroxysmal nocturnal dyspnea or shortness of breath Dermatological: negative for rash Respiratory: negative for cough or wheezing Urologic: negative for hematuria Abdominal: negative for nausea, vomiting, diarrhea, bright red blood per rectum, melena, or hematemesis Neurologic: negative for visual changes, syncope, or dizziness All other systems reviewed and are otherwise negative except as noted above.    Blood pressure (!) 154/76, pulse 90, height 5\' 4"  (1.626 m), weight 118 lb 12.8 oz (53.9 kg).  General appearance: alert, cooperative and no distress Neck: no JVD and Lt CA bruit Lungs: decreased Rt base Heart: regular rate and rhythm Extremities: no edema Skin: warm and  dry Neurologic: Grossly normal  EKG NSR-88, QTc 433  ASSESSMENT AND PLAN:   PAF (paroxysmal atrial fibrillation) (HCC) Holding NSR after DCCV Jan 3d 2020  Long term (current) use of anticoagulants CHADS VASC=4  Temporal arteritis (HCC) 07/2017 Bilateral vision loss  Essential hypertension Fair control  Carotid bruit present Will arrange for dopplers  Pre-operative clearance Discussed with Dr Gwenlyn Found in the office today- (Dr Stanford Breed is on vacation) Hold Eliquis 3 days pre op- resume as soon as possible post op   PLAN  I will fax clearance to Dr Rolena Infante via [employer].   [name] PA-C 08/01/2018 11:52 AM

## 2018-08-02 ENCOUNTER — Encounter (HOSPITAL_COMMUNITY)
Admission: RE | Admit: 2018-08-02 | Discharge: 2018-08-02 | Disposition: A | Discharge status: Home / Self Care | Payer: Medicare Other | Source: Ambulatory Visit | Attending: Orthopedic Surgery | Admitting: Orthopedic Surgery

## 2018-08-02 ENCOUNTER — Other Ambulatory Visit: Payer: Self-pay

## 2018-08-02 ENCOUNTER — Encounter (HOSPITAL_COMMUNITY): Payer: Self-pay

## 2018-08-02 ENCOUNTER — Ambulatory Visit (HOSPITAL_COMMUNITY)
Admission: RE | Admit: 2018-08-02 | Discharge: 2018-08-02 | Disposition: A | Discharge status: Home / Self Care | Payer: Medicare Other | Source: Ambulatory Visit | Attending: Orthopedic Surgery | Admitting: Orthopedic Surgery

## 2018-08-02 DIAGNOSIS — Z01818 Encounter for other preprocedural examination: Secondary | ICD-10-CM

## 2018-08-02 DIAGNOSIS — J439 Emphysema, unspecified: Secondary | ICD-10-CM | POA: Diagnosis not present

## 2018-08-02 LAB — BASIC METABOLIC PANEL
Anion gap: 14 (ref 5–15)
BUN: 10 mg/dL (ref 8–23)
CO2: 27 mmol/L (ref 22–32)
Calcium: 9.6 mg/dL (ref 8.9–10.3)
Chloride: 97 mmol/L — ABNORMAL LOW (ref 98–111)
Creatinine, Ser: 0.82 mg/dL (ref 0.44–1.00)
GFR calc Af Amer: >60 mL/min (ref >60)
GFR calc non Af Amer: >60 mL/min (ref >60)
Glucose, Bld: 85 mg/dL (ref 70–99)
Potassium: 4 mmol/L (ref 3.5–5.1)
Sodium: 138 mmol/L (ref 135–145)

## 2018-08-02 LAB — SURGICAL PCR SCREEN
MRSA, PCR: NEGATIVE
Staphylococcus aureus: NEGATIVE

## 2018-08-02 LAB — CBC
HCT: 42.3 % (ref 36.0–46.0)
Hemoglobin: 13.6 g/dL (ref 12.0–15.0)
MCH: 37.7 pg — ABNORMAL HIGH (ref 26.0–34.0)
MCHC: 32.2 g/dL (ref 30.0–36.0)
MCV: 117.2 fL — ABNORMAL HIGH (ref 80.0–100.0)
Platelets: 304 10*3/uL (ref 150–400)
RBC: 3.61 MIL/uL — ABNORMAL LOW (ref 3.87–5.11)
RDW: 15.3 % (ref 11.5–15.5)
WBC: 9.2 10*3/uL (ref 4.0–10.5)
nRBC: 0.2 % (ref 0.0–0.2)

## 2018-08-02 NOTE — Progress Notes (Addendum)
PCP - Billey Gosling, MD Cardiologist - [name], MD  Chest x-ray - 08/02/2018  In EPIC EKG - 08/01/2018 in EPIC  Stress Test -  Pt denies past 5-10 year ECHO - 04/2018  Cardiac Cath - patient denies  Sleep Study - pt denies CPAP - n/a  Fasting Blood Sugar - n/a Checks Blood Sugar _____ times a day-n/a  Blood Thinner Instructions: Hold Eliquis 3 days prior to procedure Aspirin Instructions: n/a  Anesthesia review: Yes-MD order, Cardiac clearance  Patient denies shortness of breath, fever, cough and chest pain at PAT appointment  Patient verbalized understanding of instructions that were given to them at the PAT appointment. Patient was also instructed that they will need to review over the PAT instructions again at home before surgery.

## 2018-08-03 ENCOUNTER — Encounter (HOSPITAL_COMMUNITY): Payer: Self-pay | Admitting: Vascular Surgery

## 2018-08-03 NOTE — Progress Notes (Addendum)
Anesthesia Chart Review:  Case:  [id number] Date/Time:  08/09/18 0915   Procedure:  KYPHOPLASTY L4 (N/A ) - 90 mins   Anesthesia type:  General   Pre-op diagnosis:  L4 Compression fracture   Location:  [facility] OR ROOM 04 / [place] OR   Surgeon:  [name], MD      DISCUSSION: Patient is a 73 year old female scheduled for the above procedure.  History includes former smoker (quit 1991), PAF (s/p cardioversion 06/16/18), MVP, HTN, HLD, reactive airway disease, IBS, PVD (right SFA occlusion, found to be chronically occluded per right femoral exploration 08/29/17), right femoral neck fracture (s/p percutaneous screw fixation 03/21/18), legally blind due to temporal arteritis (confirmed by biopsy 07/21/17). Follow-up chest CT planned 06/2019 for RML lung nodule per PCP Dr. [name].   Patient was seen by [name], PA-C on 08/01/18 and wrote, "...based on ACC/AHA guidelines, Crystal [name] would be at acceptable risk for the planned procedure without further cardiovascular testing.  OK to hold Eliquis 3 days pre op-resume as soon as possible post op." He discussed perioperative Eliquis instructions with covering cardiologist [name], MD.   She was maintaining SR as of 08/01/18. She will need a PT/INR on the day of surgery. If no acute changes then I anticipate that she can proceed as planned.   VS: BP (!) 143/68   Pulse 99   Temp 36.8 C   Ht 5\' 4"  (1.626 m)   Wt 50.8 kg   SpO2 99%   BMI 19.22 kg/m   PROVIDERS: [name], MD is PCP [name], MD is cardiologist. Last visit on 08/01/18 with [name], PA-C for preoperative evaluation as outlined above. He also ordered carotid Duplex in 4-5 weeks (scheduled for 08/30/18).    LABS: Labs reviewed: Acceptable for surgery. (all labs ordered are listed, but only abnormal results are displayed)  Labs Reviewed  CBC - Abnormal; Notable for the following components:      Result Value   RBC 3.61 (*)    MCV 117.2 (*)    MCH 37.7 (*)    All  other components within normal limits  BASIC METABOLIC PANEL - Abnormal; Notable for the following components:   Chloride 97 (*)    All other components within normal limits  SURGICAL PCR SCREEN    IMAGES: CXR 08/02/18: IMPRESSION: Stable mild emphysematous changes and pulmonary scarring but no acute overlying pulmonary process.   EKG: 08/01/18: SR with PACs. Cannot rule out anterior infarct (age undetermined).   CV: Echo 05/02/18: Study Conclusions - Left ventricle: The cavity size was normal. Systolic function was   normal. The estimated ejection fraction was in the range of 55%   to 60%. Wall motion was normal; there were no regional wall   motion abnormalities. Normal sinus rhythm was absent. The study   was not technically sufficient to allow evaluation of LV   diastolic dysfunction due to atrial fibrillation. - Mitral valve: There was mild to moderate regurgitation. - Left atrium: The atrium was moderately dilated. - Tricuspid valve: There was mild-moderate regurgitation. - Pulmonary arteries: PA peak pressure: 35 mm Hg (S). Impressions: - The right ventricular systolic pressure was increased consistent   with mild pulmonary hypertension.   Past Medical History:  Diagnosis Date  . Adenomatous colon polyp 2007 & 2010    Dr [name]  . Allergy    seasonal  . Arthritis   . Cataract   . Diverticulosis   . Femoral artery thrombosis,  right ([name]) 09/01/2017  . Hyperlipidemia   . Hypertension   . IBS (irritable bowel syndrome)   . Legally blind   . MVP (mitral valve prolapse)   . Paroxysmal atrial fibrillation with rapid ventricular response ([name]) 08/29/2017  . Peripheral vascular disease ([name])   . RAD (reactive airway disease)     Past Surgical History:  Procedure Laterality Date  . ABDOMINAL HYSTERECTOMY     with USO for dysfunctionall menses  . APPENDECTOMY    . ARTERY BIOPSY Bilateral 07/21/2017   Procedure: BIOPSY BILATERAL TEMPORAL ARTERY;  Surgeon: [name], MD;  Location: [facility];  Service: Vascular;  Laterality: Bilateral;  . CARDIOVERSION N/A 06/16/2018   Procedure: CARDIOVERSION;  Surgeon: [name], MD;  Location: [facility];  Service: Cardiovascular;  Laterality: N/A;  . CATARACT EXTRACTION W/ INTRAOCULAR LENS IMPLANT  2013   bilateral; Dr [name]  . COLONOSCOPY  2013   negative , due 2018; Dr [name]  . COLONOSCOPY W/ POLYPECTOMY      X2 ; diverticulosis  . EMBOLECTOMY Right 08/29/2017   Procedure: Right Groin Exploration  ;  Surgeon: Rosetta Posner, MD;  Location: [facility];  Service: Vascular;  Laterality: Right;  . HIP PINNING,CANNULATED Right 03/21/2018  . HIP PINNING,CANNULATED Right 03/21/2018   Procedure: CANNULATED HIP PINNING;  Surgeon: Rod Can, MD;  Location: [facility];  Service: Orthopedics;  Laterality: Right;  . LUMBAR LAMINECTOMY  2000   Dr Vertell Limber  . SHOULDER SURGERY     R shoulder  . TONSILLECTOMY      MEDICATIONS: . albuterol (PROVENTIL HFA;VENTOLIN HFA) 108 (90 Base) MCG/ACT inhaler  . alendronate (FOSAMAX) 70 MG tablet  . amiodarone (PACERONE) 200 MG tablet  . apixaban (ELIQUIS) 5 MG TABS tablet  . Calcium Citrate-Vitamin D (CALCIUM + D PO)  . Cholecalciferol (VITAMIN D3) 1000 units CAPS  . diltiazem (CARDIZEM CD) 120 MG 24 hr capsule  . furosemide (LASIX) 40 MG tablet  . gabapentin (NEURONTIN) 100 MG capsule  . LORazepam (ATIVAN) 1 MG tablet  . methocarbamol (ROBAXIN) 500 MG tablet  . metoprolol tartrate (LOPRESSOR) 25 MG tablet  . metoprolol tartrate (LOPRESSOR) 50 MG tablet  . montelukast (SINGULAIR) 10 MG tablet  . oxyCODONE-acetaminophen (PERCOCET/ROXICET) 5-325 MG tablet  . potassium chloride SA (K-DUR,KLOR-CON) 20 MEQ tablet  . predniSONE (DELTASONE) 5 MG tablet  . SYMBICORT 160-4.5 MCG/ACT inhaler  . theophylline (UNIPHYL) 400 MG 24 hr tablet  . traMADol (ULTRAM) 50 MG tablet   No current facility-administered medications for this encounter.     [name], PA-C Surgical  Short Stay/Anesthesiology [facility] Phone [phone] [place] Phone [phone] 08/04/2018 9:32 AM

## 2018-08-03 NOTE — Anesthesia Preprocedure Evaluation (Deleted)
Anesthesia Evaluation    Airway        Dental   Pulmonary former smoker,           Cardiovascular hypertension,      Neuro/Psych    GI/Hepatic   Endo/Other    Renal/GU      Musculoskeletal   Abdominal   Peds  Hematology   Anesthesia Other Findings   Reproductive/Obstetrics                             Anesthesia Physical Anesthesia Plan  ASA:   Anesthesia Plan:    Post-op Pain Management:    Induction:   PONV Risk Score and Plan:   Airway Management Planned:   Additional Equipment:   Intra-op Plan:   Post-operative Plan:   Informed Consent:   Plan Discussed with:   Anesthesia Plan Comments: (PAT note written by Eliane Hammersmith, PA-C. )        Anesthesia Quick Evaluation  

## 2018-08-04 ENCOUNTER — Emergency Department (HOSPITAL_COMMUNITY): Payer: Medicare Other

## 2018-08-04 ENCOUNTER — Other Ambulatory Visit: Payer: Self-pay

## 2018-08-04 ENCOUNTER — Emergency Department (HOSPITAL_COMMUNITY)
Admission: EM | Admit: 2018-08-04 | Discharge: 2018-08-05 | Disposition: A | Discharge status: Home / Self Care | Payer: Medicare Other | Attending: Emergency Medicine | Admitting: Emergency Medicine

## 2018-08-04 DIAGNOSIS — W19XXXA Unspecified fall, initial encounter: Secondary | ICD-10-CM

## 2018-08-04 DIAGNOSIS — S0990XA Unspecified injury of head, initial encounter: Secondary | ICD-10-CM | POA: Diagnosis not present

## 2018-08-04 DIAGNOSIS — I499 Cardiac arrhythmia, unspecified: Secondary | ICD-10-CM | POA: Diagnosis not present

## 2018-08-04 DIAGNOSIS — Z7901 Long term (current) use of anticoagulants: Secondary | ICD-10-CM | POA: Insufficient documentation

## 2018-08-04 DIAGNOSIS — S299XXA Unspecified injury of thorax, initial encounter: Secondary | ICD-10-CM | POA: Diagnosis not present

## 2018-08-04 DIAGNOSIS — S72001D Fracture of unspecified part of neck of right femur, subsequent encounter for closed fracture with routine healing: Secondary | ICD-10-CM | POA: Diagnosis not present

## 2018-08-04 DIAGNOSIS — Y92002 Bathroom of unspecified non-institutional (private) residence single-family (private) house as the place of occurrence of the external cause: Secondary | ICD-10-CM | POA: Diagnosis not present

## 2018-08-04 DIAGNOSIS — S81001A Unspecified open wound, right knee, initial encounter: Secondary | ICD-10-CM | POA: Insufficient documentation

## 2018-08-04 DIAGNOSIS — M79642 Pain in left hand: Secondary | ICD-10-CM | POA: Diagnosis not present

## 2018-08-04 DIAGNOSIS — E876 Hypokalemia: Secondary | ICD-10-CM | POA: Diagnosis not present

## 2018-08-04 DIAGNOSIS — Y999 Unspecified external cause status: Secondary | ICD-10-CM | POA: Insufficient documentation

## 2018-08-04 DIAGNOSIS — Z79899 Other long term (current) drug therapy: Secondary | ICD-10-CM | POA: Insufficient documentation

## 2018-08-04 DIAGNOSIS — R4020 Unspecified coma: Secondary | ICD-10-CM | POA: Diagnosis not present

## 2018-08-04 DIAGNOSIS — E039 Hypothyroidism, unspecified: Secondary | ICD-10-CM | POA: Diagnosis not present

## 2018-08-04 DIAGNOSIS — I10 Essential (primary) hypertension: Secondary | ICD-10-CM | POA: Diagnosis not present

## 2018-08-04 DIAGNOSIS — Z87891 Personal history of nicotine dependence: Secondary | ICD-10-CM | POA: Diagnosis not present

## 2018-08-04 DIAGNOSIS — Y93E8 Activity, other personal hygiene: Secondary | ICD-10-CM | POA: Insufficient documentation

## 2018-08-04 DIAGNOSIS — S199XXA Unspecified injury of neck, initial encounter: Secondary | ICD-10-CM | POA: Diagnosis not present

## 2018-08-04 DIAGNOSIS — W1811XA Fall from or off toilet without subsequent striking against object, initial encounter: Secondary | ICD-10-CM | POA: Diagnosis not present

## 2018-08-04 DIAGNOSIS — S51802A Unspecified open wound of left forearm, initial encounter: Secondary | ICD-10-CM | POA: Diagnosis present

## 2018-08-04 DIAGNOSIS — R0902 Hypoxemia: Secondary | ICD-10-CM | POA: Diagnosis not present

## 2018-08-04 DIAGNOSIS — R58 Hemorrhage, not elsewhere classified: Secondary | ICD-10-CM | POA: Diagnosis not present

## 2018-08-04 DIAGNOSIS — S0083XA Contusion of other part of head, initial encounter: Secondary | ICD-10-CM | POA: Diagnosis not present

## 2018-08-04 MED ORDER — FENTANYL CITRATE (PF) 100 MCG/2ML IJ SOLN
50.0000 ug | Freq: Once | INTRAMUSCULAR | Status: AC
  Administered 2018-08-04: 50 ug via INTRAVENOUS
  Filled 2018-08-04: qty 2

## 2018-08-04 NOTE — ED Provider Notes (Signed)
[facility] EMERGENCY DEPARTMENT Provider Note   CSN: [id number] Arrival date & time: 08/04/18  2147    History   Chief Complaint Chief Complaint  Patient presents with  . Fall    HPI Crystal [name] is a 73 y.o. female.     73 year old female who presents after a ground-level fall after her walker slid out from under her as she was getting up in the bathroom.  Patient currently on blood thinners, denies any loss of consciousness, denies hitting her head.  Patient complaining of skin tears on the left arm, right cheek pain.  Patient denies any neurological symptoms, resting comfortably.   Fall  This is a new problem. The current episode started less than 1 hour ago. The problem occurs rarely. The problem has been resolved. Pertinent negatives include no chest pain, no abdominal pain, no headaches and no shortness of breath. Nothing relieves the symptoms. She has tried nothing for the symptoms. The treatment provided no relief.    Past Medical History:  Diagnosis Date  . Adenomatous colon polyp 2007 & 2010    Dr [name]  . Allergy    seasonal  . Arthritis   . Cataract   . Diverticulosis   . 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 Hyperlipidemia   . Hypertension   . IBS (irritable bowel syndrome)   . Legally blind   . MVP (mitral valve prolapse)   . Paroxysmal atrial fibrillation with rapid ventricular response ([place]) 08/29/2017  . Peripheral vascular disease ([place])   . RAD (reactive airway disease)     Patient Active Problem List   Diagnosis Date Noted  . Long term (current) use of anticoagulants 08/01/2018  . Pre-operative clearance 08/01/2018  . Closed compression fracture of L4 vertebra ([place]) 07/11/2018  . Bilateral leg edema 07/11/2018  . Lung nodule < 6cm on CT 07/11/2018  . Subclinical hypothyroidism 04/11/2018  . Fracture of femoral neck, right ([place]) 03/22/2018  . PAF (paroxysmal atrial fibrillation) ([place]) 03/18/2018  . Femoral  artery thrombosis, right ([place]) 09/01/2017  . Herpes zoster without complication 19/62/2297  . Jaw claudication 07/27/2017  . Temporal arteritis ([place]) 07/18/2017  . Vision, loss, sudden, bilateral 07/18/2017  . 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 Osteoarthritis 06/25/2016  . Anxiety 06/25/2016  . Back pain 07/18/2015  . 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 Carotid bruit present 06/05/2010  . Osteoporosis 06/03/2008  . History of colonic polyps 06/03/2008  . INTENTION TREMOR 12/06/2007  . IBS 10/16/2007  . Hyperlipidemia 06/06/2007  . Essential hypertension 06/06/2007  . Chronic obstructive airway disease with asthma ([place]) 02/23/2007    Past Surgical History:  Procedure Laterality Date  . ABDOMINAL HYSTERECTOMY     with USO for dysfunctionall menses  . APPENDECTOMY    . ARTERY BIOPSY Bilateral 07/21/2017   Procedure: BIOPSY BILATERAL TEMPORAL ARTERY;  Surgeon: [name], MD;  Location: [place];  Service: Vascular;  Laterality: Bilateral;  . CARDIOVERSION N/A 06/16/2018   Procedure: CARDIOVERSION;  Surgeon: [name], MD;  Location: [place];  Service: Cardiovascular;  Laterality: N/A;  . CATARACT EXTRACTION W/ INTRAOCULAR LENS IMPLANT  2013   bilateral; Dr [name]  . COLONOSCOPY  2013   negative , due 2018; Dr [name]  . COLONOSCOPY W/ POLYPECTOMY      X2 ; diverticulosis  . EMBOLECTOMY Right 08/29/2017   Procedure: Right Groin Exploration  ;  Surgeon: Rosetta Posner, MD;  Location: [place];  Service: Vascular;  Laterality: Right;  .  HIP PINNING,CANNULATED Right 03/21/2018  . HIP PINNING,CANNULATED Right 03/21/2018   Procedure: CANNULATED HIP PINNING;  Surgeon: Rod Can, MD;  Location: [place];  Service: Orthopedics;  Laterality: Right;  . LUMBAR LAMINECTOMY  2000   Dr Vertell Limber  . SHOULDER SURGERY     R shoulder  . TONSILLECTOMY       OB History   No obstetric history on file.      Home Medications    Prior to Admission medications   Medication Sig Start Date End  Date Taking? Authorizing Provider  albuterol (PROVENTIL HFA;VENTOLIN HFA) 108 (90 Base) MCG/ACT inhaler INHALE 1-2  PUFFS INTO THE LUNGS EVERY 6 HOURS AS NEEDED FOR SHORTNESS OF BREATH AND WHEEZING Patient taking differently: Inhale 1-2 puffs into the lungs every 6 (six) hours as needed for wheezing or shortness of breath. INHALE 1-2  PUFFS INTO THE LUNGS EVERY 6 HOURS AS NEEDED FOR SHORTNESS OF BREATH AND WHEEZING 06/01/18  Yes Burns, Claudina Lick, MD  alendronate (FOSAMAX) 70 MG tablet Take 70 mg by mouth every Monday.  01/27/18  Yes [provider]  apixaban (ELIQUIS) 5 MG TABS tablet Take 1 tablet (5 mg total) by mouth 2 (two) times daily. 03/30/18  Yes Angiulli, Lavon Paganini, PA-C  Calcium Citrate-Vitamin D (CALCIUM + D PO) Take 1 tablet by mouth daily.   Yes [provider]  Cholecalciferol (VITAMIN D3) 1000 units CAPS Take 1 capsule (1,000 Units total) by mouth daily with lunch. 03/30/18  Yes Angiulli, Lavon Paganini, PA-C  diltiazem (CARDIZEM CD) 120 MG 24 hr capsule Take 120 mg by mouth daily.   Yes [provider]  furosemide (LASIX) 40 MG tablet TAKE ONE TABLET BY MOUTH DAILY Patient taking differently: Take 40 mg by mouth daily.  07/10/18  Yes Burns, Claudina Lick, MD  gabapentin (NEURONTIN) 100 MG capsule Take 5 capsules (500 mg total) by mouth at bedtime. 03/30/18  Yes Angiulli, Lavon Paganini, PA-C  LORazepam (ATIVAN) 1 MG tablet TAKE ONE TABLET BY MOUTH AT BEDTIME Patient taking differently: Take 1 mg by mouth at bedtime.  07/14/18  Yes Burns, Claudina Lick, MD  metoprolol tartrate (LOPRESSOR) 25 MG tablet Take 1 tablet (25 mg total) by mouth 2 (two) times daily. 03/30/18  Yes Angiulli, Lavon Paganini, PA-C  montelukast (SINGULAIR) 10 MG tablet TAKE ONE TABLET BY MOUTH DAILY Patient taking differently: Take 10 mg by mouth at bedtime.  07/10/18  Yes Burns, Claudina Lick, MD  potassium chloride SA (K-DUR,KLOR-CON) 20 MEQ tablet TAKE TWO TABLETS BY MOUTH DAILY Patient taking differently: Take 40 mEq by mouth  daily.  06/15/18  Yes Burns, Claudina Lick, MD  predniSONE (DELTASONE) 5 MG tablet Take 5 mg by mouth daily with breakfast.    Yes [provider]  SYMBICORT 160-4.5 MCG/ACT inhaler INHALE 1 OR 2 PUFFS INTO THE LUNGS EVERY 12 HOURS. THEN GARGLE AND SPIT AFTER USE Patient taking differently: Inhale 2 puffs into the lungs daily.  09/26/17  Yes Burns, Claudina Lick, MD  theophylline (UNIPHYL) 400 MG 24 hr tablet TAKE 1 TABLET (400 MG TOTAL) BY MOUTH DAILY. Patient taking differently: Take 200 mg by mouth 2 (two) times daily.  07/10/18  Yes Burns, Claudina Lick, MD  traMADol (ULTRAM) 50 MG tablet Take 1 tablet (50 mg total) by mouth every 6 (six) hours as needed for moderate pain. 03/30/18  Yes Angiulli, Lavon Paganini, PA-C  amiodarone (PACERONE) 200 MG tablet Take 1 tablet (200 mg total) by mouth daily. Patient not taking: Reported on  08/05/2018 06/16/18   Dunn, Nedra Hai, PA-C  methocarbamol (ROBAXIN) 500 MG tablet Take 1 tablet (500 mg total) by mouth every 6 (six) hours as needed for muscle spasms. Patient not taking: Reported on 08/05/2018 07/11/18   [name], MD  oxyCODONE-acetaminophen (PERCOCET/ROXICET) 5-325 MG tablet Take 1-2 tablets by mouth every 6 (six) hours as needed for severe pain. Patient not taking: Reported on 08/05/2018 07/10/18   Virgel Manifold, MD  potassium chloride SA (K-DUR,KLOR-CON) 20 MEQ tablet Take 2 tablets (40 mEq total) by mouth 2 (two) times daily for 7 days. 08/05/18 08/12/18  Orson Aloe, MD    Family History Family History  Problem Relation Age of Onset  . Heart attack Brother 23  . Parkinsonism Mother   . Stroke Mother   . Heart attack Father        pre 52  . Diabetes Sister   . COPD Sister        X 3  . Other Son        suicide 2016  . Colon cancer Neg Hx   . Cancer Neg Hx   . Esophageal cancer Neg Hx   . Liver cancer Neg Hx   . Pancreatic cancer Neg Hx   . Rectal cancer Neg Hx   . Stomach cancer Neg Hx     Social History Social History   Tobacco Use  .  Smoking status: Former Smoker    Packs/day: 1.00    Years: 23.00    Pack years: 23.00    Last attempt to quit: 06/14/1989    Years since quitting: 29.1  . Smokeless tobacco: Never Used  . Tobacco comment: smoked 1961-1991 , up to < 1 ppd  Substance Use Topics  . Alcohol use: Yes    Alcohol/week: 14.0 standard drinks    Types: 14 Glasses of wine per week    Comment: Wine  . Drug use: No     Allergies   Simvastatin   Review of Systems Review of Systems  Constitutional: Negative for chills and fever.  HENT: Negative for ear pain and sore throat.   Eyes: Negative for pain and visual disturbance.  Respiratory: Negative for cough and shortness of breath.   Cardiovascular: Negative for chest pain and palpitations.  Gastrointestinal: Negative for abdominal pain and vomiting.  Genitourinary: Negative for dysuria and hematuria.  Musculoskeletal: Negative for arthralgias and back pain.  Skin: Negative for color change and rash.  Neurological: Negative for seizures, syncope and headaches.  All other systems reviewed and are negative.    Physical Exam Updated Vital Signs BP 131/63 (BP Location: Right Arm)   Pulse 68   Temp 98.6 F (37 C) (Oral)   Resp 18   Ht 5\' 4"  (1.626 m)   Wt 50 kg   SpO2 95%   BMI 18.92 kg/m   Physical Exam Vitals signs and nursing note reviewed.  Constitutional:      General: She is not in acute distress.    Appearance: She is well-developed.     Comments: Patient resting comfortably, GCS 15, hemodynamically stable.  HENT:     Head: Normocephalic and atraumatic.     Comments: Swelling, tenderness over the right zygomatic arch Eyes:     Conjunctiva/sclera: Conjunctivae normal.     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  Neck:     Musculoskeletal: Neck supple.  Cardiovascular:     Rate and Rhythm: Normal rate and regular rhythm.  Heart sounds: No murmur.  Pulmonary:     Effort: Pulmonary effort is normal. No  respiratory distress.     Breath sounds: Normal breath sounds.  Abdominal:     Palpations: Abdomen is soft.     Tenderness: There is no abdominal tenderness.  Musculoskeletal:     Comments: No pain to the pelvis, patient ambulatory in the emergency department.  Skin:    General: Skin is warm and dry.     Capillary Refill: Capillary refill takes less than 2 seconds.     Comments: Skin tear to the left forearm, left and right knee.  Hemostatic.  Neurological:     General: No focal deficit present.     Mental Status: She is alert and oriented to person, place, and time. Mental status is at baseline.     Cranial Nerves: No cranial nerve deficit.     Sensory: No sensory deficit.     Motor: No weakness.     Coordination: Coordination normal.     Gait: Gait normal.     Deep Tendon Reflexes: Reflexes normal.     Comments: GCS 15, normal neurological exam.  Psychiatric:        Mood and Affect: Mood normal.      ED Treatments / Results  Labs (all labs ordered are listed, but only abnormal results are displayed) Labs Reviewed  COMPREHENSIVE METABOLIC PANEL - Abnormal; Notable for the following components:      Result Value   Potassium 2.8 (*)    Anion gap 18 (*)    All other components within normal limits  CBC WITH DIFFERENTIAL/PLATELET - Abnormal; Notable for the following components:   RBC 3.50 (*)    MCV 116.6 (*)    MCH 37.7 (*)    Abs Immature Granulocytes 0.22 (*)    All other components within normal limits  URINALYSIS, ROUTINE W REFLEX MICROSCOPIC - Abnormal; Notable for the following components:   Color, Urine STRAW (*)    Specific Gravity, Urine 1.004 (*)    Hgb urine dipstick SMALL (*)    All other components within normal limits  APTT    EKG None  Radiology Dg Chest 1 View  Result Date: 08/04/2018 CLINICAL DATA:  Fall EXAM: CHEST  1 VIEW COMPARISON:  08/02/2018 FINDINGS: The heart size and mediastinal contours are within normal limits. Both lungs are clear.  The visualized skeletal structures are unremarkable. IMPRESSION: No active disease. Electronically Signed   By: Donavan Foil M.D.   On: 08/04/2018 23:10   Dg Pelvis 1-2 Views  Result Date: 08/04/2018 CLINICAL DATA:  Fall EXAM: PELVIS - 1-2 VIEW COMPARISON:  03/21/2018 FINDINGS: SI joints are non widened. The pubic symphysis and rami are intact. Left femoral head projects in joint. Threaded screw fixation of the right femur for subcapital fracture, similar alignment. No dislocation. IMPRESSION: 1. No acute osseous abnormality. 2. Threaded screw fixation of the right femur for femoral neck fracture, similar alignment. Electronically Signed   By: Donavan Foil M.D.   On: 08/04/2018 23:11   Ct Head Wo Contrast  Result Date: 08/04/2018 CLINICAL DATA:  Initial evaluation for acute head trauma.  Fall. EXAM: CT HEAD WITHOUT CONTRAST CT MAXILLOFACIAL WITHOUT CONTRAST CT CERVICAL SPINE WITHOUT CONTRAST TECHNIQUE: Multidetector CT imaging of the head, cervical spine, and maxillofacial structures were performed using the standard protocol without intravenous contrast. Multiplanar CT image reconstructions of the cervical spine and maxillofacial structures were also generated. COMPARISON:  Prior CT from 03/18/2018 FINDINGS: CT HEAD  FINDINGS Brain: Age-related cerebral atrophy with mild chronic small vessel ischemic disease. No acute intracranial hemorrhage. No acute large vessel territory infarct. No mass lesion, midline shift or mass effect. No hydrocephalus. No extra-axial fluid collection. Vascular: No hyperdense vessel. Scattered vascular calcifications noted within the carotid siphons. Skull: Scalp soft tissues and calvarium within normal limits. Other: Small left mastoid effusion, of doubtful significance. CT MAXILLOFACIAL FINDINGS Osseous: Zygomatic arches intact. No acute maxillary fracture. Pterygoid plates intact. Nasal bones intact. Nasal septum mildly deviated to the right but intact. No acute mandibular  fracture. Mandibular condyles normally situated. No acute abnormality about the dentition. Orbits: Globes and orbital soft tissues within normal limits. Patient status post bilateral ocular lens replacement. Sinuses: Paranasal sinuses are clear.  No hemosinus. Soft tissues: Right facial contusion without frank hematoma. No other acute injury about the face. CT CERVICAL SPINE FINDINGS Alignment: Straightening of the normal cervical lordosis. Trace retrolisthesis of C2 on C3, with trace anterolisthesis of C3 on C4 and C4 on C5, likely chronic and facet mediated. Skull base and vertebrae: Skull base intact. Normal C1-2 articulations are preserved in the dens is intact. Vertebral body heights maintained. No acute fracture. Soft tissues and spinal canal: Soft tissues of the neck demonstrate no acute finding. Prevertebral soft tissues normal. Spinal canal within normal limits. Vascular calcifications about the carotid bifurcations. Disc levels: Prominent facet arthropathy noted within the upper cervical spine, most notable at C3-4 on the left and C4-5 on the right. Upper chest: Visualized upper chest demonstrates no acute abnormality. Centrilobular emphysema noted at the lung apices. Other: None. IMPRESSION: CT HEAD: 1. No acute intracranial abnormality. 2. Age-related cerebral atrophy with mild chronic small vessel ischemic disease. CT MAXILLOFACIAL: 1. Acute right facial soft tissue contusion. 2. No other acute maxillofacial injury.  No fracture. CT CERVICAL SPINE: No acute traumatic injury within the cervical spine. Electronically Signed   By: Jeannine Boga M.D.   On: 08/04/2018 23:16   Ct Cervical Spine Wo Contrast  Result Date: 08/04/2018 CLINICAL DATA:  Initial evaluation for acute head trauma.  Fall. EXAM: CT HEAD WITHOUT CONTRAST CT MAXILLOFACIAL WITHOUT CONTRAST CT CERVICAL SPINE WITHOUT CONTRAST TECHNIQUE: Multidetector CT imaging of the head, cervical spine, and maxillofacial structures were  performed using the standard protocol without intravenous contrast. Multiplanar CT image reconstructions of the cervical spine and maxillofacial structures were also generated. COMPARISON:  Prior CT from 03/18/2018 FINDINGS: CT HEAD FINDINGS Brain: Age-related cerebral atrophy with mild chronic small vessel ischemic disease. No acute intracranial hemorrhage. No acute large vessel territory infarct. No mass lesion, midline shift or mass effect. No hydrocephalus. No extra-axial fluid collection. Vascular: No hyperdense vessel. Scattered vascular calcifications noted within the carotid siphons. Skull: Scalp soft tissues and calvarium within normal limits. Other: Small left mastoid effusion, of doubtful significance. CT MAXILLOFACIAL FINDINGS Osseous: Zygomatic arches intact. No acute maxillary fracture. Pterygoid plates intact. Nasal bones intact. Nasal septum mildly deviated to the right but intact. No acute mandibular fracture. Mandibular condyles normally situated. No acute abnormality about the dentition. Orbits: Globes and orbital soft tissues within normal limits. Patient status post bilateral ocular lens replacement. Sinuses: Paranasal sinuses are clear.  No hemosinus. Soft tissues: Right facial contusion without frank hematoma. No other acute injury about the face. CT CERVICAL SPINE FINDINGS Alignment: Straightening of the normal cervical lordosis. Trace retrolisthesis of C2 on C3, with trace anterolisthesis of C3 on C4 and C4 on C5, likely chronic and facet mediated. Skull base and vertebrae: Skull base intact. Normal  C1-2 articulations are preserved in the dens is intact. Vertebral body heights maintained. No acute fracture. Soft tissues and spinal canal: Soft tissues of the neck demonstrate no acute finding. Prevertebral soft tissues normal. Spinal canal within normal limits. Vascular calcifications about the carotid bifurcations. Disc levels: Prominent facet arthropathy noted within the upper cervical  spine, most notable at C3-4 on the left and C4-5 on the right. Upper chest: Visualized upper chest demonstrates no acute abnormality. Centrilobular emphysema noted at the lung apices. Other: None. IMPRESSION: CT HEAD: 1. No acute intracranial abnormality. 2. Age-related cerebral atrophy with mild chronic small vessel ischemic disease. CT MAXILLOFACIAL: 1. Acute right facial soft tissue contusion. 2. No other acute maxillofacial injury.  No fracture. CT CERVICAL SPINE: No acute traumatic injury within the cervical spine. Electronically Signed   By: Jeannine Boga M.D.   On: 08/04/2018 23:16   Dg Hand Complete Left  Result Date: 08/04/2018 CLINICAL DATA:  Hand pain EXAM: LEFT HAND - COMPLETE 3+ VIEW COMPARISON:  None. FINDINGS: No acute displaced fracture or malalignment. Mild arthritis at the first [facility] joint. Prominent arthritis at the third through fifth DIP joints and the fifth PIP joint. Periarticular calcification at the fifth PIP joint. No radiopaque foreign body. IMPRESSION: No acute osseous abnormality. Electronically Signed   By: Donavan Foil M.D.   On: 08/04/2018 23:27   Ct Maxillofacial Wo Contrast  Result Date: 08/04/2018 CLINICAL DATA:  Initial evaluation for acute head trauma.  Fall. EXAM: CT HEAD WITHOUT CONTRAST CT MAXILLOFACIAL WITHOUT CONTRAST CT CERVICAL SPINE WITHOUT CONTRAST TECHNIQUE: Multidetector CT imaging of the head, cervical spine, and maxillofacial structures were performed using the standard protocol without intravenous contrast. Multiplanar CT image reconstructions of the cervical spine and maxillofacial structures were also generated. COMPARISON:  Prior CT from 03/18/2018 FINDINGS: CT HEAD FINDINGS Brain: Age-related cerebral atrophy with mild chronic small vessel ischemic disease. No acute intracranial hemorrhage. No acute large vessel territory infarct. No mass lesion, midline shift or mass effect. No hydrocephalus. No extra-axial fluid collection. Vascular: No  hyperdense vessel. Scattered vascular calcifications noted within the carotid siphons. Skull: Scalp soft tissues and calvarium within normal limits. Other: Small left mastoid effusion, of doubtful significance. CT MAXILLOFACIAL FINDINGS Osseous: Zygomatic arches intact. No acute maxillary fracture. Pterygoid plates intact. Nasal bones intact. Nasal septum mildly deviated to the right but intact. No acute mandibular fracture. Mandibular condyles normally situated. No acute abnormality about the dentition. Orbits: Globes and orbital soft tissues within normal limits. Patient status post bilateral ocular lens replacement. Sinuses: Paranasal sinuses are clear.  No hemosinus. Soft tissues: Right facial contusion without frank hematoma. No other acute injury about the face. CT CERVICAL SPINE FINDINGS Alignment: Straightening of the normal cervical lordosis. Trace retrolisthesis of C2 on C3, with trace anterolisthesis of C3 on C4 and C4 on C5, likely chronic and facet mediated. Skull base and vertebrae: Skull base intact. Normal C1-2 articulations are preserved in the dens is intact. Vertebral body heights maintained. No acute fracture. Soft tissues and spinal canal: Soft tissues of the neck demonstrate no acute finding. Prevertebral soft tissues normal. Spinal canal within normal limits. Vascular calcifications about the carotid bifurcations. Disc levels: Prominent facet arthropathy noted within the upper cervical spine, most notable at C3-4 on the left and C4-5 on the right. Upper chest: Visualized upper chest demonstrates no acute abnormality. Centrilobular emphysema noted at the lung apices. Other: None. IMPRESSION: CT HEAD: 1. No acute intracranial abnormality. 2. Age-related cerebral atrophy with mild chronic small vessel  ischemic disease. CT MAXILLOFACIAL: 1. Acute right facial soft tissue contusion. 2. No other acute maxillofacial injury.  No fracture. CT CERVICAL SPINE: No acute traumatic injury within the  cervical spine. Electronically Signed   By: Jeannine Boga M.D.   On: 08/04/2018 23:16    Procedures Procedures (including critical care time)  Medications Ordered in ED Medications  fentaNYL (SUBLIMAZE) injection 50 mcg (50 mcg Intravenous Given 08/04/18 2347)  potassium chloride SA (K-DUR,KLOR-CON) CR tablet 40 mEq (40 mEq Oral Given 08/05/18 0139)     Initial Impression / Assessment and Plan / ED Course  I have reviewed the triage vital signs and the nursing notes.  Pertinent labs & imaging results that were available during my care of the patient were reviewed by me and considered in my medical decision making (see chart for details).        73 year old female with significant past medical history listed above including blindness who sustained a mechanical fall as she was getting up from the toilet and her walker slipped out from under her.  Patient denies hitting her head, loss of consciousness, neurological symptoms or infectious symptoms.  Fall was mechanical, patient denies any presyncopal episodes, palpitations.  Physical exam positive for hemostatic skin tears on the bilateral lower knees as well as the left forearm.  Will perform x-ray, CT scans, laboratory studies.  CT scans and x-ray indicate no acute fracture at this time.  C-collar cleared, no pain to palpation on C-spine.  Patient has known compression fracture in the lumbar spine, indicates this is been to be fixed next week there is been no change in pain since that time.  Patient's potassium 2.8; she has chronically low potassium according the patient and takes 20 twice daily of potassium, however she missed today's dosing.  We will give 40 here in the emergency department.  Patient indicates that she would prefer to follow-up with her primary care in the next few days for redraw, will give 40 potassium daily prescription in the meantime.  Patient in agreement with this plan.  EKG reviewed, using decision making  regarding management.  No signs of acute ischemia, appropriate intervals, at relative baseline from prior.  Based on negative imaging, potassium replacement here in the emergency department and patient with good follow-up.  Patient discharged in stable edition with stable vital signs.  Patient with strict return precautions.  The above care was discussed and agreed upon by my attending physician.  Final Clinical Impressions(s) / ED Diagnoses   Final diagnoses:  Fall, initial encounter  Hypokalemia    ED Discharge Orders         Ordered    potassium chloride SA (K-DUR,KLOR-CON) 20 MEQ tablet  2 times daily     08/05/18 0112           Orson Aloe, MD 08/05/18 [id number]    Elnora Morrison, MD 08/10/18 [phone]

## 2018-08-04 NOTE — ED Notes (Signed)
ED Provider at bedside. C-collar removed 

## 2018-08-04 NOTE — ED Triage Notes (Signed)
Pt brought in by EMS. Pt fell forward when she was getting up from the toilet. Swelling to the right side of the face and skin tear on left arm. Pt on blood thinners. No LOC.

## 2018-08-05 LAB — URINALYSIS, ROUTINE W REFLEX MICROSCOPIC
Bacteria, UA: NONE SEEN
Bilirubin Urine: NEGATIVE
Glucose, UA: NEGATIVE mg/dL
Ketones, ur: NEGATIVE mg/dL
Leukocytes,Ua: NEGATIVE
Nitrite: NEGATIVE
Protein, ur: NEGATIVE mg/dL
Specific Gravity, Urine: 1.004 — ABNORMAL LOW (ref 1.005–1.030)
pH: 7 (ref 5.0–8.0)

## 2018-08-05 LAB — CBC WITH DIFFERENTIAL/PLATELET
Abs Immature Granulocytes: 0.22 10*3/uL — ABNORMAL HIGH (ref 0.00–0.07)
Basophils Absolute: 0.1 10*3/uL (ref 0.0–0.1)
Basophils Relative: 1 %
Eosinophils Absolute: 0 10*3/uL (ref 0.0–0.5)
Eosinophils Relative: 0 %
HCT: 40.8 % (ref 36.0–46.0)
Hemoglobin: 13.2 g/dL (ref 12.0–15.0)
Immature Granulocytes: 3 %
Lymphocytes Relative: 21 %
Lymphs Abs: 1.5 10*3/uL (ref 0.7–4.0)
MCH: 37.7 pg — ABNORMAL HIGH (ref 26.0–34.0)
MCHC: 32.4 g/dL (ref 30.0–36.0)
MCV: 116.6 fL — ABNORMAL HIGH (ref 80.0–100.0)
Monocytes Absolute: 1 10*3/uL (ref 0.1–1.0)
Monocytes Relative: 14 %
Neutro Abs: 4.3 10*3/uL (ref 1.7–7.7)
Neutrophils Relative %: 61 %
Platelets: 274 10*3/uL (ref 150–400)
RBC: 3.5 MIL/uL — ABNORMAL LOW (ref 3.87–5.11)
RDW: 15.5 % (ref 11.5–15.5)
WBC: 7.1 10*3/uL (ref 4.0–10.5)
nRBC: 0 % (ref 0.0–0.2)

## 2018-08-05 LAB — COMPREHENSIVE METABOLIC PANEL
ALT: 28 U/L (ref 0–44)
AST: 37 U/L (ref 15–41)
Albumin: 4 g/dL (ref 3.5–5.0)
Alkaline Phosphatase: 84 U/L (ref 38–126)
Anion gap: 18 — ABNORMAL HIGH (ref 5–15)
BUN: 10 mg/dL (ref 8–23)
CO2: 25 mmol/L (ref 22–32)
Calcium: 9 mg/dL (ref 8.9–10.3)
Chloride: 98 mmol/L (ref 98–111)
Creatinine, Ser: 0.63 mg/dL (ref 0.44–1.00)
GFR calc Af Amer: >60 mL/min (ref >60)
GFR calc non Af Amer: >60 mL/min (ref >60)
Glucose, Bld: 84 mg/dL (ref 70–99)
Potassium: 2.8 mmol/L — ABNORMAL LOW (ref 3.5–5.1)
Sodium: 141 mmol/L (ref 135–145)
Total Bilirubin: 0.7 mg/dL (ref 0.3–1.2)
Total Protein: 6.8 g/dL (ref 6.5–8.1)

## 2018-08-05 LAB — APTT: aPTT: 33 seconds (ref 24–36)

## 2018-08-05 MED ORDER — POTASSIUM CHLORIDE CRYS ER 20 MEQ PO TBCR: 40.0000 meq | EXTENDED_RELEASE_TABLET | Freq: Two times a day (BID) | ORAL | 0 refills | Status: DC

## 2018-08-05 MED ORDER — POTASSIUM CHLORIDE CRYS ER 20 MEQ PO TBCR
40.0000 meq | EXTENDED_RELEASE_TABLET | Freq: Once | ORAL | Status: AC
  Administered 2018-08-05: 40 meq via ORAL
  Filled 2018-08-05: qty 2

## 2018-08-06 ENCOUNTER — Other Ambulatory Visit: Payer: Self-pay | Admitting: Internal Medicine

## 2018-08-07 NOTE — Telephone Encounter (Signed)
Last refill was 07/15/18 Last OV was 07/11/18 Next OV 10/02/18

## 2018-08-08 ENCOUNTER — Ambulatory Visit: Payer: Self-pay

## 2018-08-08 NOTE — Telephone Encounter (Signed)
Pt. called to schedule a hospital f/u appt., following a fall on 2/21, when she went to the ER.  Reported she fell again, last night, and landed on floor, on left side.  Reported she has bruising on left side of face, and both shoulders. Stated she has mild pain in left rib area, beneath breast, when she takes a deep breath; "2/10".  Denied bruising or swelling in left rib region, and denied rib pain at rest.  Also, c/o scraped knees.  Stated she is not short of breath.  Described the mild pain with deep breath, "like a catch."  Is on Eliquis.  Denied any cuts or bleeding with fall.  Stated she is not in any pain, elsewhere.  Stated she is unsure why she fell.  Is blind.  Reported she uses a walker, and doesn't think it moves well on the tile floor.  Appt. scheduled with PCP, tomorrow, 2/26 for acute appt., due to a second fall, after being seen at the ER.  Care advice given per protocol.  Advised to call back if pain or breathing worsens.  Verb. Understanding.  Agrees with plan.    Reason for Disposition . [1] High-risk adult (e.g., age > 31, osteoporosis, chronic steroid use) AND [2] still hurts  Answer Assessment - Initial Assessment Questions 1. MECHANISM: "How did the injury happen?"     Fell on left side last night 2. ONSET: "When did the injury happen?" (Minutes or hours ago)     2/24 @ approx. 11:30 PM 3. LOCATION: "Where on the chest is the injury located?"     It hurts in the ribs under left breast 4. APPEARANCE: "What does the injury look like?"    No bruising or swelling of rib area; has bruising in both shoulders  5. BLEEDING: "Is there any bleeding now? If so, ask: How long has it been bleeding?"     Denied 6. SEVERITY: "Any difficulty with breathing?"     Denied shortness of breath; c/o pain with deep breath; "2/10"  7. SIZE: For cuts, bruises, or swelling, ask: "How large is it?" (e.g., inches or centimeters)    Scraped both knees at time of fall 8. PAIN: "Is there pain?" If so,  ask: "How bad is the pain?"   (e.g., Scale 1-10; or mild, moderate, severe)     2/10 with deep breath 9. TETANUS: For any breaks in the skin, ask: "When was the last tetanus booster?"     N/a  10. PREGNANCY: "Is there any chance you are pregnant?" "When was your last menstrual period?"      N/a  Protocols used: CHEST INJURY-A-AH

## 2018-08-08 NOTE — Progress Notes (Signed)
Subjective:    Patient ID: Crystal [name], female    DOB: [birth year], 73 y.o.   MRN: [id number]  HPI The patient is here for an acute visit.  Her husband is with her today.   Fell x 2 recently: She fell 2/21 and went to the emergency room.  Her walker slid out from under her when she was getting up from the toilet.  She did not hit her head or lose consciousness.  She hit her right cheek and left side.  She was not experiencing any chest pain, abdominal pain, headaches and shortness of breath.  Her urinalysis was unremarkable.  Blood work unremarkable except for low potassium, which was repleted and her home potassium dose was doubled.  She states prior to this she was not taking her potassium consistently and has been taking it consistently since then.  She did receive some pain medication.  She had imaging of her left hand, chest, pelvis, face, neck and head.  There were no fractures or acute findings.  She is on tramadol and has been taking tramadol since then, which does help with pain.  Been taking tramadol since then, which does help the pain.  2 nights ago was the second fall.  She thinks the walker may have gotten caught on something and she fell on her left side.  She hit her left cheek, which is swollen and bruised.  The area is not painful.  She is having some left-sided rib pain with deep breaths.  She is able to move around in bed and does not have any pain in the ribs.  She denies shortness of breath, chest pain other than the rib pain, cough, wheezing and fevers.  Most of her other pain is related to arthritis and she denies any significant pain from her previous fall.  The tramadol is controlling her pain.  Hypokalemia: She is taking 2 potassium pills twice daily.  She states in the past this was too much and she is concerned if she should continue the higher dose.  She reiterates that she is not taking her potassium consistently prior to going to the hospital which may have caused the  low level.   L4 compression fracture: Most of her pain is from her L4 compression fracture.  She was scheduled to have a kyphoplasty today, but wanted to delay this.  She did not feel that the surgeon was understanding and feels she needs to find a different surgeon to perform the surgery   Medications and allergies reviewed with patient and updated if appropriate.  Patient Active Problem List   Diagnosis Date Noted  . Long term (current) use of anticoagulants 08/01/2018  . Pre-operative clearance 08/01/2018  . Closed compression fracture of L4 vertebra ([place]) 07/11/2018  . Bilateral leg edema 07/11/2018  . Lung nodule < 6cm on CT 07/11/2018  . Subclinical hypothyroidism 04/11/2018  . Fracture of femoral neck, right ([place]) 03/22/2018  . PAF (paroxysmal atrial fibrillation) ([place]) 03/18/2018  . 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 Herpes zoster without complication 25/10/3974  . Jaw claudication 07/27/2017  . Temporal arteritis ([place]) 07/18/2017  . Vision, loss, sudden, bilateral 07/18/2017  . 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 Osteoarthritis 06/25/2016  . Anxiety 06/25/2016  . Back pain 07/18/2015  . 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 Carotid bruit present 06/05/2010  . Osteoporosis 06/03/2008  . History of colonic polyps 06/03/2008  . INTENTION TREMOR 12/06/2007  . IBS 10/16/2007  . Hyperlipidemia 06/06/2007  .  Essential hypertension 06/06/2007  . Chronic obstructive airway disease with asthma ([place]) 02/23/2007    Current Outpatient Medications on File Prior to Visit  Medication Sig Dispense Refill  . albuterol (PROVENTIL HFA;VENTOLIN HFA) 108 (90 Base) MCG/ACT inhaler INHALE 1-2  PUFFS INTO THE LUNGS EVERY 6 HOURS AS NEEDED FOR SHORTNESS OF BREATH AND WHEEZING (Patient taking differently: Inhale 1-2 puffs into the lungs every 6 (six) hours as needed for wheezing or shortness of breath. INHALE 1-2  PUFFS INTO THE LUNGS EVERY 6 HOURS AS NEEDED FOR SHORTNESS OF BREATH AND  WHEEZING) 8.5 each 2  . alendronate (FOSAMAX) 70 MG tablet Take 70 mg by mouth every Monday.     Marland Kitchen amiodarone (PACERONE) 200 MG tablet Take 1 tablet (200 mg total) by mouth daily. 30 tablet 6  . apixaban (ELIQUIS) 5 MG TABS tablet Take 1 tablet (5 mg total) by mouth 2 (two) times daily. 60 tablet 6  . Calcium Citrate-Vitamin D (CALCIUM + D PO) Take 1 tablet by mouth daily.    . Cholecalciferol (VITAMIN D3) 1000 units CAPS Take 1 capsule (1,000 Units total) by mouth daily with lunch. 60 capsule 0  . diltiazem (CARDIZEM CD) 120 MG 24 hr capsule Take 120 mg by mouth daily.    . furosemide (LASIX) 40 MG tablet TAKE ONE TABLET BY MOUTH DAILY (Patient taking differently: Take 40 mg by mouth daily. ) 90 tablet 2  . gabapentin (NEURONTIN) 100 MG capsule Take 5 capsules (500 mg total) by mouth at bedtime. 30 capsule 0  . LORazepam (ATIVAN) 1 MG tablet Take 1 tablet (1 mg total) by mouth at bedtime. 30 tablet 0  . methocarbamol (ROBAXIN) 500 MG tablet Take 1 tablet (500 mg total) by mouth every 6 (six) hours as needed for muscle spasms. 120 tablet 3  . metoprolol tartrate (LOPRESSOR) 25 MG tablet Take 1 tablet (25 mg total) by mouth 2 (two) times daily. 60 tablet 0  . montelukast (SINGULAIR) 10 MG tablet TAKE ONE TABLET BY MOUTH DAILY (Patient taking differently: Take 10 mg by mouth at bedtime. ) 72 tablet 0  . oxyCODONE-acetaminophen (PERCOCET/ROXICET) 5-325 MG tablet Take 1-2 tablets by mouth every 6 (six) hours as needed for severe pain. 20 tablet 0  . potassium chloride SA (K-DUR,KLOR-CON) 20 MEQ tablet TAKE TWO TABLETS BY MOUTH DAILY (Patient taking differently: Take 40 mEq by mouth daily. ) 180 tablet 1  . potassium chloride SA (K-DUR,KLOR-CON) 20 MEQ tablet Take 2 tablets (40 mEq total) by mouth 2 (two) times daily for 7 days. 28 tablet 0  . predniSONE (DELTASONE) 5 MG tablet Take 5 mg by mouth daily with breakfast.     . SYMBICORT 160-4.5 MCG/ACT inhaler INHALE 1 OR 2 PUFFS INTO THE LUNGS EVERY 12  HOURS. THEN GARGLE AND SPIT AFTER USE (Patient taking differently: Inhale 2 puffs into the lungs daily. ) 6 g 10  . theophylline (UNIPHYL) 400 MG 24 hr tablet TAKE 1 TABLET (400 MG TOTAL) BY MOUTH DAILY. (Patient taking differently: Take 200 mg by mouth 2 (two) times daily. ) 90 tablet 2  . traMADol (ULTRAM) 50 MG tablet Take 1 tablet (50 mg total) by mouth every 6 (six) hours as needed for moderate pain. 60 tablet 0   No current facility-administered medications on file prior to visit.     Past Medical History:  Diagnosis Date  . Adenomatous colon polyp 2007 & 2010    Dr [name]  . Allergy    seasonal  . Arthritis   .  Cataract   . Diverticulosis   . 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 Hyperlipidemia   . Hypertension   . IBS (irritable bowel syndrome)   . Legally blind   . MVP (mitral valve prolapse)   . Paroxysmal atrial fibrillation with rapid ventricular response ([place]) 08/29/2017  . Peripheral vascular disease ([place])   . RAD (reactive airway disease)     Past Surgical History:  Procedure Laterality Date  . ABDOMINAL HYSTERECTOMY     with USO for dysfunctionall menses  . APPENDECTOMY    . ARTERY BIOPSY Bilateral 07/21/2017   Procedure: BIOPSY BILATERAL TEMPORAL ARTERY;  Surgeon: [name], MD;  Location: [facility];  Service: Vascular;  Laterality: Bilateral;  . CARDIOVERSION N/A 06/16/2018   Procedure: CARDIOVERSION;  Surgeon: [name], MD;  Location: [facility];  Service: Cardiovascular;  Laterality: N/A;  . CATARACT EXTRACTION W/ INTRAOCULAR LENS IMPLANT  2013   bilateral; Dr [name]  . COLONOSCOPY  2013   negative , due 2018; Dr [name]  . COLONOSCOPY W/ POLYPECTOMY      X2 ; diverticulosis  . EMBOLECTOMY Right 08/29/2017   Procedure: Right Groin Exploration  ;  Surgeon: Rosetta Posner, MD;  Location: [facility];  Service: Vascular;  Laterality: Right;  . HIP PINNING,CANNULATED Right 03/21/2018  . HIP PINNING,CANNULATED Right 03/21/2018   Procedure:  CANNULATED HIP PINNING;  Surgeon: Rod Can, MD;  Location: [facility];  Service: Orthopedics;  Laterality: Right;  . LUMBAR LAMINECTOMY  2000   Dr Vertell Limber  . SHOULDER SURGERY     R shoulder  . TONSILLECTOMY      Social History   Socioeconomic History  . Marital status: Married    Spouse name: Not on file  . Number of children: Not on file  . Years of education: Not on file  . Highest education level: Not on file  Occupational History  . Occupation: retired  Scientific laboratory technician  . Financial resource strain: Not on file  . Food insecurity:    Worry: Not on file    Inability: Not on file  . Transportation needs:    Medical: Not on file    Non-medical: Not on file  Tobacco Use  . Smoking status: Former Smoker    Packs/day: 1.00    Years: 23.00    Pack years: 23.00    Last attempt to quit: 06/14/1989    Years since quitting: 29.1  . Smokeless tobacco: Never Used  . Tobacco comment: smoked 1961-1991 , up to < 1 ppd  Substance and Sexual Activity  . Alcohol use: Yes    Alcohol/week: 14.0 standard drinks    Types: 14 Glasses of wine per week    Comment: Wine  . Drug use: No  . Sexual activity: Not on file  Lifestyle  . Physical activity:    Days per week: Not on file    Minutes per session: Not on file  . Stress: Not on file  Relationships  . Social connections:    Talks on phone: Not on file    Gets together: Not on file    Attends religious service: Not on file    Active member of club or organization: Not on file    Attends meetings of clubs or organizations: Not on file    Relationship status: Not on file  Other Topics Concern  . Not on file  Social History Narrative  . Not on file    Family History  Problem Relation Age  of Onset  . Heart attack Brother 85  . Parkinsonism Mother   . Stroke Mother   . Heart attack Father        pre 24  . Diabetes Sister   . COPD Sister        X 3  . Other Son        suicide 2016  . Colon cancer Neg Hx   . Cancer Neg Hx   .  Esophageal cancer Neg Hx   . Liver cancer Neg Hx   . Pancreatic cancer Neg Hx   . Rectal cancer Neg Hx   . Stomach cancer Neg Hx     Review of Systems  Constitutional: Negative for fever.  Respiratory: Negative for cough, shortness of breath and wheezing.   Cardiovascular: Positive for chest pain (left rib pain) and leg swelling (controlled).  Neurological: Positive for headaches (no more than usual). Negative for dizziness and light-headedness.       Objective:   Vitals:   08/09/18 1012  BP: (!) 150/64  Pulse: 67  Resp: 16  Temp: 98.7 F (37.1 C)  SpO2: 96%   BP Readings from Last 3 Encounters:  08/09/18 (!) 150/64  08/05/18 131/63  08/02/18 (!) 143/68   Wt Readings from Last 3 Encounters:  08/09/18 113 lb (51.3 kg)  08/04/18 110 lb 3.7 oz (50 kg)  08/02/18 112 lb (50.8 kg)   Body mass index is 19.4 kg/m.   Physical Exam Constitutional:      Appearance: She is normal weight.  HENT:     Head: Normocephalic.     Comments: Significant bruising and swelling bilateral cheeks and lower orbits-nontender    Nose: Nose normal.     Mouth/Throat:     Mouth: Mucous membranes are moist.  Eyes:     Conjunctiva/sclera: Conjunctivae normal.  Cardiovascular:     Rate and Rhythm: Normal rate and regular rhythm.  Pulmonary:     Effort: Pulmonary effort is normal.     Breath sounds: Normal breath sounds. No wheezing or rhonchi.  Musculoskeletal:     Comments: Left-sided lateral rib pain with palpation-no obvious deformities-pain is not severe  Skin:    General: Skin is warm and dry.     Findings: Bruising (Bilateral cheeks, bilateral shoulders, left hand and several other areas related to to most recent falls) present.  Neurological:     Mental Status: She is alert.            Assessment & Plan:    See Problem List for Assessment and Plan of chronic medical problems.

## 2018-08-09 ENCOUNTER — Encounter: Payer: Self-pay | Admitting: Internal Medicine

## 2018-08-09 ENCOUNTER — Ambulatory Visit (INDEPENDENT_AMBULATORY_CARE_PROVIDER_SITE_OTHER)
Admission: RE | Admit: 2018-08-09 | Discharge: 2018-08-09 | Disposition: A | Discharge status: Home / Self Care | Payer: Medicare Other | Source: Ambulatory Visit | Attending: Internal Medicine | Admitting: Internal Medicine

## 2018-08-09 ENCOUNTER — Other Ambulatory Visit (INDEPENDENT_AMBULATORY_CARE_PROVIDER_SITE_OTHER): Payer: Medicare Other

## 2018-08-09 ENCOUNTER — Telehealth: Payer: Self-pay

## 2018-08-09 ENCOUNTER — Ambulatory Visit (HOSPITAL_COMMUNITY): Admission: RE | Admit: 2018-08-09 | Payer: Medicare Other | Source: Home / Self Care | Admitting: Orthopedic Surgery

## 2018-08-09 ENCOUNTER — Ambulatory Visit (INDEPENDENT_AMBULATORY_CARE_PROVIDER_SITE_OTHER): Payer: Medicare Other | Admitting: Internal Medicine

## 2018-08-09 VITALS — BP 150/64 | HR 67 | Temp 98.7°F | Resp 16 | Ht 64.0 in | Wt 113.0 lb

## 2018-08-09 DIAGNOSIS — R0781 Pleurodynia: Secondary | ICD-10-CM | POA: Diagnosis not present

## 2018-08-09 DIAGNOSIS — S32040A Wedge compression fracture of fourth lumbar vertebra, initial encounter for closed fracture: Secondary | ICD-10-CM | POA: Diagnosis not present

## 2018-08-09 DIAGNOSIS — S299XXA Unspecified injury of thorax, initial encounter: Secondary | ICD-10-CM | POA: Diagnosis not present

## 2018-08-09 DIAGNOSIS — E876 Hypokalemia: Secondary | ICD-10-CM

## 2018-08-09 LAB — BASIC METABOLIC PANEL
BUN: 13 mg/dL (ref 6–23)
CO2: 27 mEq/L (ref 19–32)
Calcium: 9.4 mg/dL (ref 8.4–10.5)
Chloride: 99 mEq/L (ref 96–112)
Creatinine, Ser: 0.94 mg/dL (ref 0.40–1.20)
GFR: 58.13 mL/min — ABNORMAL LOW (ref >60.00)
Glucose, Bld: 88 mg/dL (ref 70–99)
Potassium: 4.8 mEq/L (ref 3.5–5.1)
Sodium: 136 mEq/L (ref 135–145)

## 2018-08-09 SURGERY — KYPHOPLASTY
Anesthesia: General

## 2018-08-09 MED ORDER — TRAMADOL HCL 50 MG PO TABS: 50.0000 mg | ORAL_TABLET | Freq: Four times a day (QID) | ORAL | 0 refills | Status: DC | PRN

## 2018-08-09 NOTE — Patient Instructions (Addendum)
Have a rib xray today and blood work today.    We will call you with the results    Continue the tramadol   A referral was ordered for orthopedics.

## 2018-08-09 NOTE — Telephone Encounter (Signed)
Husband is aware.

## 2018-08-09 NOTE — Assessment & Plan Note (Signed)
Left-sided rib pain after fall 2 nights ago Pain with deep breaths, but no pain with movement Likely just a contusion, but given her history of fractures and being on a blood thinner we will get a chest x-ray/left rib x-rays Continue pain management with tramadol, which I refilled

## 2018-08-09 NOTE — Assessment & Plan Note (Signed)
Potassium low when she went to the hospital last and her home potassium dose was doubled, which she is taking consistently When she had the blood work done she admits she was not taking the medication consistently We will recheck BMP today and hopefully decrease dose of back to original dosing

## 2018-08-09 NOTE — Progress Notes (Signed)
HPI: FU atrial fibrillation. 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 Developed leg pain 3/19 and found to be in atrial fibrillation; ultrasound revealed thrombotic occlusion of her right SFA with reconstitution of popliteal artery above the knee, no ultrasound evidence of plaque, this appears to be consistent with emboli. 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  Last echocardiogram November 2019 showed normal LV function, mild to moderate mitral regurgitation, moderate left atrial enlargement and mild to moderate tricuspid regurgitation.  Patient had a right buttock subcutaneous hematoma September 2019.  Patient fell October 2019 and had a subcapital fracture of the right hip.  She was in atrial fibrillation with rapid ventricular response at that time.  She underwent repair of her hip fracture and was placed on amiodarone. Had cardioversion June 16, 2018. Since last seen,there is no dyspnea, chest pain or syncope.  She has fallen twice recently striking her face.  Current Outpatient Medications  Medication Sig Dispense Refill  . albuterol (PROVENTIL HFA;VENTOLIN HFA) 108 (90 Base) MCG/ACT inhaler INHALE 1-2  PUFFS INTO THE LUNGS EVERY 6 HOURS AS NEEDED FOR SHORTNESS OF BREATH AND WHEEZING (Patient taking differently: Inhale 1-2 puffs into the lungs every 6 (six) hours as needed for wheezing or shortness of breath. INHALE 1-2  PUFFS INTO THE LUNGS EVERY 6 HOURS AS NEEDED FOR SHORTNESS OF BREATH AND WHEEZING) 8.5 each 2  . alendronate (FOSAMAX) 70 MG tablet Take 70 mg by mouth every Monday.     Marland Kitchen apixaban (ELIQUIS) 5 MG TABS tablet Take 1 tablet (5 mg total) by mouth 2 (two) times daily. 60 tablet 6  . Calcium Citrate-Vitamin D (CALCIUM + D PO) Take 1 tablet by mouth daily.    . Cholecalciferol (VITAMIN D3) 1000 units CAPS Take 1 capsule (1,000 Units  total) by mouth daily with lunch. 60 capsule 0  . diltiazem (CARDIZEM CD) 120 MG 24 hr capsule Take 120 mg by mouth daily.    . furosemide (LASIX) 40 MG tablet TAKE ONE TABLET BY MOUTH DAILY (Patient taking differently: Take 40 mg by mouth daily. ) 90 tablet 2  . gabapentin (NEURONTIN) 100 MG capsule Take 5 capsules (500 mg total) by mouth at bedtime. 30 capsule 0  . LORazepam (ATIVAN) 1 MG tablet Take 1 tablet (1 mg total) by mouth at bedtime. 30 tablet 0  . methocarbamol (ROBAXIN) 500 MG tablet Take 1 tablet (500 mg total) by mouth every 6 (six) hours as needed for muscle spasms. 120 tablet 3  . montelukast (SINGULAIR) 10 MG tablet TAKE ONE TABLET BY MOUTH DAILY (Patient taking differently: Take 10 mg by mouth at bedtime. ) 72 tablet 0  . oxyCODONE-acetaminophen (PERCOCET/ROXICET) 5-325 MG tablet Take 1-2 tablets by mouth every 6 (six) hours as needed for severe pain. 20 tablet 0  . potassium chloride SA (K-DUR,KLOR-CON) 20 MEQ tablet Take 20 mEq by mouth 2 (two) times daily.    . predniSONE (DELTASONE) 5 MG tablet Take 5 mg by mouth daily with breakfast.     . SYMBICORT 160-4.5 MCG/ACT inhaler INHALE 1 OR 2 PUFFS INTO THE LUNGS EVERY 12 HOURS. THEN GARGLE AND SPIT AFTER USE (Patient taking differently: Inhale 2 puffs into the lungs daily. ) 6 g 10  . theophylline (UNIPHYL) 400 MG 24 hr tablet TAKE 1 TABLET (400 MG TOTAL) BY MOUTH DAILY. (Patient taking differently: Take 200 mg  by mouth 2 (two) times daily. ) 90 tablet 2  . traMADol (ULTRAM) 50 MG tablet Take 1 tablet (50 mg total) by mouth every 6 (six) hours as needed for moderate pain. 90 tablet 0   No current facility-administered medications for this visit.      Past Medical History:  Diagnosis Date  . Adenomatous colon polyp 2007 & 2010    Dr [name]  . Allergy    seasonal  . Arthritis   . Cataract   . Diverticulosis   . 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 Hyperlipidemia   . Hypertension   . IBS (irritable bowel  syndrome)   . Legally blind   . MVP (mitral valve prolapse)   . Paroxysmal atrial fibrillation with rapid ventricular response ([place]) 08/29/2017  . Peripheral vascular disease ([place])   . RAD (reactive airway disease)     Past Surgical History:  Procedure Laterality Date  . ABDOMINAL HYSTERECTOMY     with USO for dysfunctionall menses  . APPENDECTOMY    . ARTERY BIOPSY Bilateral 07/21/2017   Procedure: BIOPSY BILATERAL TEMPORAL ARTERY;  Surgeon: [name], MD;  Location: [facility];  Service: Vascular;  Laterality: Bilateral;  . CARDIOVERSION N/A 06/16/2018   Procedure: CARDIOVERSION;  Surgeon: [name], MD;  Location: [facility];  Service: Cardiovascular;  Laterality: N/A;  . CATARACT EXTRACTION W/ INTRAOCULAR LENS IMPLANT  2013   bilateral; Dr [name]  . COLONOSCOPY  2013   negative , due 2018; Dr [name]  . COLONOSCOPY W/ POLYPECTOMY      X2 ; diverticulosis  . EMBOLECTOMY Right 08/29/2017   Procedure: Right Groin Exploration  ;  Surgeon: Rosetta Posner, MD;  Location: [facility];  Service: Vascular;  Laterality: Right;  . HIP PINNING,CANNULATED Right 03/21/2018  . HIP PINNING,CANNULATED Right 03/21/2018   Procedure: CANNULATED HIP PINNING;  Surgeon: Rod Can, MD;  Location: [facility];  Service: Orthopedics;  Laterality: Right;  . LUMBAR LAMINECTOMY  2000   Dr Vertell Limber  . SHOULDER SURGERY     R shoulder  . TONSILLECTOMY      Social History   Socioeconomic History  . Marital status: Married    Spouse name: Not on file  . Number of children: Not on file  . Years of education: Not on file  . Highest education level: Not on file  Occupational History  . Occupation: retired  Scientific laboratory technician  . Financial resource strain: Not on file  . Food insecurity:    Worry: Not on file    Inability: Not on file  . Transportation needs:    Medical: Not on file    Non-medical: Not on file  Tobacco Use  . Smoking status: Former Smoker    Packs/day: 1.00    Years: 23.00    Pack  years: 23.00    Last attempt to quit: 06/14/1989    Years since quitting: 29.1  . Smokeless tobacco: Never Used  . Tobacco comment: smoked 1961-1991 , up to < 1 ppd  Substance and Sexual Activity  . Alcohol use: Yes    Alcohol/week: 14.0 standard drinks    Types: 14 Glasses of wine per week    Comment: Wine  . Drug use: No  . Sexual activity: Not on file  Lifestyle  . Physical activity:    Days per week: Not on file    Minutes per session: Not on file  . Stress: Not on file  Relationships  . Social connections:  Talks on phone: Not on file    Gets together: Not on file    Attends religious service: Not on file    Active member of club or organization: Not on file    Attends meetings of clubs or organizations: Not on file    Relationship status: Not on file  . Intimate partner violence:    Fear of current or ex partner: Not on file    Emotionally abused: Not on file    Physically abused: Not on file    Forced sexual activity: Not on file  Other Topics Concern  . Not on file  Social History Narrative  . Not on file    Family History  Problem Relation Age of Onset  . Heart attack Brother 19  . Parkinsonism Mother   . Stroke Mother   . Heart attack Father        pre 83  . Diabetes Sister   . COPD Sister        X 3  . Other Son        suicide 2016  . Colon cancer Neg Hx   . Cancer Neg Hx   . Esophageal cancer Neg Hx   . Liver cancer Neg Hx   . Pancreatic cancer Neg Hx   . Rectal cancer Neg Hx   . Stomach cancer Neg Hx     ROS: no fevers or chills, productive cough, hemoptysis, dysphasia, odynophagia, melena, hematochezia, dysuria, hematuria, rash, seizure activity, orthopnea, PND, pedal edema, claudication. Remaining systems are negative.  Physical Exam: Well-developed well-nourished in no acute distress.  Skin is warm and dry.  HEENT is normal.  Neck is supple.  Chest is clear to auscultation with normal expansion.  Cardiovascular exam is regular rate and  rhythm.  Abdominal exam nontender or distended. No masses palpated. Extremities show no edema. neuro grossly intact  A/P  1 paroxysmal atrial fibrillation/flutter-patient is status post cardioversion and remains in sinus rhythm today.  Amiodarone was discontinued but we will resume at 200 mg daily.  I had a long discussion with she and her husband today concerning use of apixaban.  She has fallen recently twice striking her face.  She has a large hematoma and ecchymosis on the left side of her face and ecchymosis on the right.  I explained the risk and benefit of anticoagulation.  She understands the higher risk of CVA off of anticoagulation.  I did recommend discontinuing but she wants to avoid CVA at all cost.  She would like to continue.  I have asked him to contact us with any further falls and she would be willing to discontinue at that point.  2 hypertension-patient's blood pressure is controlled.  Continue present medications and follow.  3 hyperlipidemia-continue statin.  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  Continue statin.  5 giant cell arteritis-Per primary care.  [name], MD

## 2018-08-09 NOTE — Telephone Encounter (Signed)
Referral ordered for neurosurgery

## 2018-08-09 NOTE — Assessment & Plan Note (Signed)
L4 compression fracture-was scheduled to have kyphoplasty today by [facility], but did postpone the surgery-she feels the surgeon is not willing to reschedule and wants a new referral We will refer

## 2018-08-09 NOTE — Telephone Encounter (Signed)
Copied from [place] [id number]. Topic: Referral - Status >> Aug 09, 2018  2:22 PM Lennox Solders wrote: Reason for CRM: pt husband Juanda Crumble is calling to let dr burns know [facility] does not do kyphoplasty.

## 2018-08-14 ENCOUNTER — Encounter: Payer: Self-pay | Admitting: Cardiology

## 2018-08-14 ENCOUNTER — Ambulatory Visit: Payer: Medicare Other | Admitting: Cardiology

## 2018-08-14 VITALS — BP 128/62 | HR 92 | Ht 64.0 in | Wt 121.0 lb

## 2018-08-14 DIAGNOSIS — I480 Paroxysmal atrial fibrillation: Secondary | ICD-10-CM | POA: Diagnosis not present

## 2018-08-14 DIAGNOSIS — I10 Essential (primary) hypertension: Secondary | ICD-10-CM

## 2018-08-14 DIAGNOSIS — E7800 Pure hypercholesterolemia, unspecified: Secondary | ICD-10-CM

## 2018-08-14 MED ORDER — AMIODARONE HCL 200 MG PO TABS: 200.0000 mg | ORAL_TABLET | Freq: Every day | ORAL | 3 refills | Status: DC

## 2018-08-14 NOTE — Patient Instructions (Signed)
Medication Instructions:  RESTART AMIODARONE 200 MG ONCE DAILY If you need a refill on your cardiac medications before your next appointment, please call your pharmacy.   Lab work: If you have labs (blood work) drawn today and your tests are completely normal, you will receive your results only by: Marland Kitchen MyChart Message (if you have MyChart) OR . A paper copy in the mail If you have any lab test that is abnormal or we need to change your treatment, we will call you to review the results.  Follow-Up: At [facility], you and your health needs are our priority.  As part of our continuing mission to provide you with exceptional heart care, we have created designated Provider Care Teams.  These Care Teams include your primary Cardiologist (physician) and Advanced Practice Providers (APPs -  Physician Assistants and Nurse Practitioners) who all work together to provide you with the care you need, when you need it. Your physician recommends that you schedule a follow-up appointment in: [place] physician recommends that you schedule a follow-up appointment in: [place]

## 2018-08-28 ENCOUNTER — Other Ambulatory Visit (HOSPITAL_COMMUNITY): Payer: Self-pay | Admitting: Physician Assistant

## 2018-08-29 ENCOUNTER — Other Ambulatory Visit (HOSPITAL_COMMUNITY): Payer: Self-pay | Admitting: Physician Assistant

## 2018-08-30 ENCOUNTER — Ambulatory Visit (HOSPITAL_COMMUNITY)
Admission: RE | Admit: 2018-08-30 | Discharge: 2018-08-30 | Disposition: A | Discharge status: Home / Self Care | Payer: Medicare Other | Source: Ambulatory Visit | Attending: Internal Medicine | Admitting: Internal Medicine

## 2018-08-30 ENCOUNTER — Other Ambulatory Visit: Payer: Self-pay

## 2018-08-30 DIAGNOSIS — R0989 Other specified symptoms and signs involving the circulatory and respiratory systems: Secondary | ICD-10-CM | POA: Diagnosis not present

## 2018-08-31 ENCOUNTER — Other Ambulatory Visit: Payer: Self-pay | Admitting: Internal Medicine

## 2018-08-31 NOTE — Telephone Encounter (Signed)
Last refill was 08/09/18 Last OV 07/11/18 Next OV 10/02/18

## 2018-09-05 DIAGNOSIS — M542 Cervicalgia: Secondary | ICD-10-CM | POA: Diagnosis not present

## 2018-09-07 ENCOUNTER — Telehealth: Payer: Self-pay | Admitting: Internal Medicine

## 2018-09-07 ENCOUNTER — Ambulatory Visit: Payer: Self-pay | Admitting: Internal Medicine

## 2018-09-07 NOTE — Telephone Encounter (Signed)
Already forwarded to Dr. [name] in another encounter

## 2018-09-07 NOTE — Telephone Encounter (Signed)
Patient advised of dr burns note/instructions

## 2018-09-07 NOTE — Telephone Encounter (Signed)
Please advise 

## 2018-09-07 NOTE — Telephone Encounter (Signed)
She can take coricidin cold products and plain mucinex

## 2018-09-07 NOTE — Telephone Encounter (Signed)
She is wondering what OTC medications she can take for sinus issues.   She's on Eliquis.     Question sent to Dr. [name].

## 2018-09-14 ENCOUNTER — Other Ambulatory Visit: Payer: Self-pay | Admitting: Internal Medicine

## 2018-09-15 ENCOUNTER — Telehealth: Payer: Self-pay | Admitting: Internal Medicine

## 2018-09-15 MED ORDER — MONTELUKAST SODIUM 10 MG PO TABS: 10.0000 mg | ORAL_TABLET | Freq: Every day | ORAL | 1 refills | Status: DC

## 2018-09-15 NOTE — Telephone Encounter (Signed)
Copied from [place] [id number]. Topic: Quick Communication - Rx Refill/Question >> Sep 15, 2018 10:13 AM Sheran Luz wrote: Medication: montelukast (SINGULAIR) 10 MG tablet   Patient is requesting a refill of this medication.    Preferred Pharmacy (with phone number or street name): [facility] [address], [place] [address] [phone] (Phone) [phone] (Fax)

## 2018-09-15 NOTE — Telephone Encounter (Signed)
Rx refilled.

## 2018-09-17 ENCOUNTER — Other Ambulatory Visit: Payer: Self-pay | Admitting: Internal Medicine

## 2018-09-18 ENCOUNTER — Telehealth: Payer: Self-pay

## 2018-09-18 NOTE — Telephone Encounter (Signed)
FYI

## 2018-09-18 NOTE — Telephone Encounter (Signed)
Copied from [place] [phone]. Topic: General - Other >> Sep 18, 2018  3:29 PM Leward Quan A wrote: Reason for CRM: Hillary from Dr Kary Kos office called back to say that they finally got in touch with patient husband. Patient refused to get scheduled told them that she will not do anything until the COVID 19 issue is over. Any questions please call [place] at [phone] Ext [phone]

## 2018-09-18 NOTE — Telephone Encounter (Signed)
I can not tell if she is still supposed to be currently taking this.

## 2018-09-18 NOTE — Telephone Encounter (Signed)
noted 

## 2018-09-25 ENCOUNTER — Telehealth: Payer: Self-pay

## 2018-09-25 NOTE — Telephone Encounter (Signed)
Spoke with patient and informed her that we are not seeing patients in the office unless its a urgent need. She stated she is doing fine and does not need to be seen right now and declined a virtual visit. I informed patient that we are rescheduling appointments out to August. She said that was fine and that she would like an appt then because she is doing ok.  PATIENT DID NOT GIVE CONSENT FOR EVISIT.  NO RECALL PLACED. PATIENT HAS 2 SCHEDULED FOLLOW UP APPOINTMENTS WITH DR Stanford Breed AT THIS TIME.

## 2018-09-27 ENCOUNTER — Ambulatory Visit: Payer: Medicare Other | Admitting: Cardiology

## 2018-09-29 ENCOUNTER — Other Ambulatory Visit: Payer: Self-pay | Admitting: Internal Medicine

## 2018-10-02 ENCOUNTER — Ambulatory Visit: Payer: Medicare Other | Admitting: Internal Medicine

## 2018-10-11 ENCOUNTER — Other Ambulatory Visit: Payer: Self-pay | Admitting: Internal Medicine

## 2018-10-12 ENCOUNTER — Other Ambulatory Visit: Payer: Self-pay | Admitting: Internal Medicine

## 2018-10-12 ENCOUNTER — Telehealth: Payer: Self-pay | Admitting: Internal Medicine

## 2018-10-12 ENCOUNTER — Telehealth: Payer: Self-pay

## 2018-10-12 MED ORDER — ALBUTEROL SULFATE HFA 108 (90 BASE) MCG/ACT IN AERS: 1.0000 | INHALATION_SPRAY | Freq: Four times a day (QID) | RESPIRATORY_TRACT | 0 refills | Status: DC | PRN

## 2018-10-12 NOTE — Telephone Encounter (Signed)
Copied from [place] [phone]. Topic: General - Other >> Oct 12, 2018 11:22 AM Lennox Solders wrote: Reason for CRM: alice pharmacist with [facility] is calling needs to verify albuterol inhaler directions

## 2018-10-12 NOTE — Telephone Encounter (Signed)
Left a detailed voice message for the patient about switching her upcoming appointment to a video virtual visit and that she can call our office back and someone will be glad to assist her with the change.

## 2018-10-12 NOTE — Telephone Encounter (Signed)
Corrected directions and resent to pharmacy.

## 2018-10-15 ENCOUNTER — Other Ambulatory Visit: Payer: Self-pay | Admitting: Internal Medicine

## 2018-10-16 ENCOUNTER — Ambulatory Visit: Payer: Medicare Other | Admitting: Cardiology

## 2018-11-01 ENCOUNTER — Telehealth: Payer: Self-pay

## 2018-11-01 ENCOUNTER — Other Ambulatory Visit: Payer: Self-pay | Admitting: Internal Medicine

## 2018-11-01 NOTE — Telephone Encounter (Signed)
Phone call

## 2018-11-01 NOTE — Telephone Encounter (Signed)
Last OV 08/09/18 Next OV 12/26/18  [place] Controlled Substance Database checked. Last filled on 10/05/18

## 2018-11-01 NOTE — Progress Notes (Signed)
Virtual Visit via Telephone Note  I connected with Crystal [name] on 11/02/18 at  8:45 AM EDT by a video enabled telemedicine application and verified that I am speaking with the correct person using two identifiers.   I discussed the limitations of evaluation and management by telemedicine and the availability of in person appointments. The patient expressed understanding and agreed to proceed.  The patient is currently at home and I am in the office.    No referring provider.   Time spent talking on phone 8 minutes.  History of Present Illness: This is an acute visit for depression.  Her husband called yesterday stating that he felt his wife was depressed and needed to go on a medication.  She was sleeping a lot and he felt she was depressed.   I spoke only with her today and she denies significant depression and does not feel like she needs medication.  She is a little depressed but feels it is normal for the current situation.  She is depressed that she can not go out and can not go to the pool and exercise.  She has not seen her friends.  She is eating normally, but does not eat a lot.  Her sleep is not great but nothing new.  She has been taking a nap during the day which is not normal, but part of that is there is less to do.    She has tried lexapro and did not tolerate it.  She does feel fine and denies depression.     Social History   Socioeconomic History  . Marital status: Married    Spouse name: Not on file  . Number of children: Not on file  . Years of education: Not on file  . Highest education level: Not on file  Occupational History  . Occupation: retired  Scientific laboratory technician  . Financial resource strain: Not on file  . Food insecurity:    Worry: Not on file    Inability: Not on file  . Transportation needs:    Medical: Not on file    Non-medical: Not on file  Tobacco Use  . Smoking status: Former Smoker    Packs/day: 1.00    Years: 23.00    Pack years: 23.00   Last attempt to quit: 06/14/1989    Years since quitting: 29.4  . Smokeless tobacco: Never Used  . Tobacco comment: smoked 1961-1991 , up to < 1 ppd  Substance and Sexual Activity  . Alcohol use: Yes    Alcohol/week: 14.0 standard drinks    Types: 14 Glasses of wine per week    Comment: Wine  . Drug use: No  . Sexual activity: Not on file  Lifestyle  . Physical activity:    Days per week: Not on file    Minutes per session: Not on file  . Stress: Not on file  Relationships  . Social connections:    Talks on phone: Not on file    Gets together: Not on file    Attends religious service: Not on file    Active member of club or organization: Not on file    Attends meetings of clubs or organizations: Not on file    Relationship status: Not on file  Other Topics Concern  . Not on file  Social History Narrative  . Not on file     Observations/Objective: Appears well in NAD   Assessment and Plan:  See Problem List for Assessment and Plan of  chronic medical problems.   Follow Up Instructions:    I discussed the assessment and treatment plan with the patient. The patient was provided an opportunity to ask questions and all were answered. The patient agreed with the plan and demonstrated an understanding of the instructions.   The patient was advised to call back or seek an in-person evaluation if the symptoms worsen or if the condition fails to improve as anticipated.    [name], MD

## 2018-11-01 NOTE — Telephone Encounter (Signed)
Set up phone call °

## 2018-11-01 NOTE — Telephone Encounter (Signed)
Copied from [place] [phone]. Topic: General - Other >> Nov 01, 2018 11:33 AM Keene Breath wrote: Reason for CRM: Husband called because he is concerned that his wife might need an antidepressant because she is not eating much and wants to sleep a lot.  Patient does not want to come into the office and the husband wants to speak with a nurse or doctor to see if they can call something in.  Offered to set up a virtual appt., but husband stated that he would rather speak with a nurse or doctor first.  CB# [phone]

## 2018-11-01 NOTE — Telephone Encounter (Signed)
Phone call set up for tomorrow at 8:45 am.

## 2018-11-02 ENCOUNTER — Ambulatory Visit (INDEPENDENT_AMBULATORY_CARE_PROVIDER_SITE_OTHER): Payer: Medicare Other | Admitting: Internal Medicine

## 2018-11-02 ENCOUNTER — Other Ambulatory Visit: Payer: Self-pay | Admitting: Internal Medicine

## 2018-11-02 ENCOUNTER — Encounter: Payer: Self-pay | Admitting: Internal Medicine

## 2018-11-02 DIAGNOSIS — F329 Major depressive disorder, single episode, unspecified: Secondary | ICD-10-CM

## 2018-11-02 DIAGNOSIS — F32A Depression, unspecified: Secondary | ICD-10-CM | POA: Insufficient documentation

## 2018-11-02 NOTE — Assessment & Plan Note (Signed)
Has situational depression related to coronavirus situation only - she does not feel this is a problem and is normal for what is going on  - I agree She declines any medication She plans on seeing one of her friends tomorrow and thinks that will help She will let me know if she needs anything

## 2018-11-06 ENCOUNTER — Other Ambulatory Visit: Payer: Self-pay | Admitting: Internal Medicine

## 2018-11-07 NOTE — Telephone Encounter (Signed)
Pravastatin was d/c'd 06/16/18 by Dr. Stanford Breed.  Ok to Rf?

## 2018-11-14 ENCOUNTER — Other Ambulatory Visit: Payer: Self-pay | Admitting: Cardiology

## 2018-11-14 DIAGNOSIS — I480 Paroxysmal atrial fibrillation: Secondary | ICD-10-CM

## 2018-11-16 ENCOUNTER — Other Ambulatory Visit: Payer: Self-pay | Admitting: Internal Medicine

## 2018-11-23 ENCOUNTER — Other Ambulatory Visit: Payer: Self-pay | Admitting: Internal Medicine

## 2018-11-24 NOTE — Telephone Encounter (Signed)
Last refill was 11/03/18 Last OV 07/11/18 Next OV 12/26/18

## 2018-11-29 ENCOUNTER — Other Ambulatory Visit: Payer: Self-pay | Admitting: Internal Medicine

## 2018-11-29 MED ORDER — PREDNISONE 10 MG PO TABS: ORAL_TABLET | ORAL | 0 refills | Status: DC

## 2018-11-29 NOTE — Telephone Encounter (Signed)
Pt is in [place].  Spouse states pt is blind and can not complete a virtual visit.  States pt is having a gout flare up and is requesting meds.

## 2018-11-29 NOTE — Telephone Encounter (Signed)
Start prednisone taper - pending - not sure which pharmacy she wants it sent to.  Hold daily prednisone and then resume after finishing taper.

## 2018-12-01 ENCOUNTER — Telehealth: Payer: Self-pay | Admitting: Internal Medicine

## 2018-12-01 ENCOUNTER — Other Ambulatory Visit: Payer: Self-pay | Admitting: Internal Medicine

## 2018-12-01 NOTE — Telephone Encounter (Signed)
Pharmacy was requesting max usage for inhaler to process with insurance due to inhaler having 200 puffs.  Spoke with Stef, informed pharmacy of 2 puffs/day.

## 2018-12-01 NOTE — Telephone Encounter (Signed)
Copied from [place] [phone]. Topic: General - Other >> Dec 01, 2018 11:54 AM Rainey Pines A wrote: Pharmacy asked for a callback a need clarification on directions for patients albuterol at [phone]

## 2018-12-04 ENCOUNTER — Ambulatory Visit: Payer: Medicare Other | Admitting: Cardiology

## 2018-12-08 DIAGNOSIS — S72031D Displaced midcervical fracture of right femur, subsequent encounter for closed fracture with routine healing: Secondary | ICD-10-CM | POA: Diagnosis not present

## 2018-12-21 ENCOUNTER — Other Ambulatory Visit: Payer: Self-pay | Admitting: Internal Medicine

## 2018-12-22 ENCOUNTER — Other Ambulatory Visit: Payer: Self-pay | Admitting: Internal Medicine

## 2018-12-26 ENCOUNTER — Ambulatory Visit: Payer: Medicare Other | Admitting: Internal Medicine

## 2018-12-30 ENCOUNTER — Other Ambulatory Visit: Payer: Self-pay | Admitting: Internal Medicine

## 2019-01-01 NOTE — Telephone Encounter (Signed)
Last OV 11/02/18 Next OV 01/01/19  [place] Controlled Substance Database checked. Last filled on 12/01/18

## 2019-01-01 NOTE — Progress Notes (Signed)
Subjective:    Patient ID: Crystal [name], female    DOB: August 09, 1945, 73 y.o.   MRN: [id number]  HPI The patient is here for follow up.  Afib, Hypertension: She is taking her medication daily. She is compliant with a low sodium diet.  She denies chest pain, palpitations, shortness of breath and regular headaches. She does not monitor her blood pressure at home.  B/l leg edema:  She is taking lasix 40 mg daily.  Currently controlled with.    Anxiety: She is taking her medication daily as prescribed. She denies any side effects from the medication. She feels her anxiety is fairly well controlled and she is happy with her current dose of medication.   Subclinical Hypothyroidism:  She is taking her medication daily.  She denies any recent changes in energy or weight that are unexplained.   Chronic obstructive asthma:  She is taking theophylline daily as prescribed.  She also uses her inhalers.  She feels her asthma is well controlled.  She denies cough, wheeze and shortness of breath at this time.  Osteoarthritis, back pain from previous compression fracture:  She takes tramadol and tylenol as needed.  Back pain 5/10  She is not itchy sensation in the right side of her abdomen and occasionally around her waistband.  She denies using any new products.  She has tried an over-the-counter cortisone cream without much relief.  She denies any known causes.  ? Gout: He had a recent which he thought was gout flare.  She did take the prednisone taper as prescribed and that did help.  She still has occasional tingling in that area.  She denies pain.       Medications and allergies reviewed with patient and updated if appropriate.  Patient Active Problem List   Diagnosis Date Noted  . Depression 11/02/2018  . Rib pain on left side 08/09/2018  . Hypokalemia 08/09/2018  . Long term (current) use of anticoagulants 08/01/2018  . Pre-operative clearance 08/01/2018  . Closed compression fracture of L4  vertebra ([place]) 07/11/2018  . Bilateral leg edema 07/11/2018  . Lung nodule < 6cm on CT 07/11/2018  . Subclinical hypothyroidism 04/11/2018  . Fracture of femoral neck, right ([place]) 03/22/2018  . PAF (paroxysmal atrial fibrillation) ([place]) 03/18/2018  . 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 Herpes zoster without complication 49/17/9150  . Jaw claudication 07/27/2017  . Temporal arteritis ([place]) 07/18/2017  . Vision, loss, sudden, bilateral 07/18/2017  . 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 Osteoarthritis 06/25/2016  . Anxiety 06/25/2016  . Back pain 07/18/2015  . 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 Carotid bruit present 06/05/2010  . Osteoporosis 06/03/2008  . History of colonic polyps 06/03/2008  . INTENTION TREMOR 12/06/2007  . IBS 10/16/2007  . Hyperlipidemia 06/06/2007  . Essential hypertension 06/06/2007  . Chronic obstructive airway disease with asthma ([place]) 02/23/2007    Current Outpatient Medications on File Prior to Visit  Medication Sig Dispense Refill  . albuterol (VENTOLIN HFA) 108 (90 Base) MCG/ACT inhaler INHALE 1 PUFF INTO THE LUNGS AS DIRECTED IF NEEDED 8.5 g 0  . alendronate (FOSAMAX) 70 MG tablet Take 70 mg by mouth every Monday.     Marland Kitchen amiodarone (PACERONE) 200 MG tablet Take 1 tablet (200 mg total) by mouth daily. 90 tablet 3  . Calcium Citrate-Vitamin D (CALCIUM + D PO) Take 1 tablet by mouth daily.    . Cholecalciferol (VITAMIN D3) 1000 units CAPS Take 1 capsule (1,000 Units total) by mouth  daily with lunch. 60 capsule 0  . diltiazem (CARDIZEM CD) 120 MG 24 hr capsule TAKE ONE CAPSULE BY MOUTH DAILY 90 capsule 1  . ELIQUIS 5 MG TABS tablet TAKE 1 TABLET BY MOUTH TWO TIMES A DAY 180 tablet 1  . furosemide (LASIX) 40 MG tablet TAKE ONE TABLET BY MOUTH DAILY (Patient taking differently: Take 40 mg by mouth daily. ) 90 tablet 2  . gabapentin (NEURONTIN) 100 MG capsule Take 5 capsules (500 mg total) by mouth at bedtime. 30 capsule 0  . LORazepam (ATIVAN) 1 MG tablet  TAKE ONE TABLET BY MOUTH EVERY NIGHT AT BEDTIME 30 tablet 0  . methocarbamol (ROBAXIN) 500 MG tablet Take 1 tablet (500 mg total) by mouth every 6 (six) hours as needed for muscle spasms. 120 tablet 3  . montelukast (SINGULAIR) 10 MG tablet Take 1 tablet (10 mg total) by mouth at bedtime. 90 tablet 1  . potassium chloride SA (K-DUR,KLOR-CON) 20 MEQ tablet TAKE TWO TABLETS BY MOUTH DAILY 180 tablet 0  . predniSONE (DELTASONE) 5 MG tablet Take 5 mg by mouth daily with breakfast.     . SYMBICORT 160-4.5 MCG/ACT inhaler INHALE ONE TO TWO PUFFS BY MOUTH EVERY 12 HOURS, THEN GARGLE AND SPIT AFTER USE 10.2 g 9  . theophylline (UNIPHYL) 400 MG 24 hr tablet TAKE 1 TABLET (400 MG TOTAL) BY MOUTH DAILY. (Patient taking differently: Take 200 mg by mouth 2 (two) times daily. ) 90 tablet 2  . traMADol (ULTRAM) 50 MG tablet Take 1 tablet (50 mg total) by mouth every 6 (six) hours as needed for moderate pain. 90 tablet 0   No current facility-administered medications on file prior to visit.     Past Medical History:  Diagnosis Date  . Adenomatous colon polyp 2007 & 2010    Dr [name]  . Allergy    seasonal  . Arthritis   . Cataract   . Diverticulosis   . 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 Hyperlipidemia   . Hypertension   . IBS (irritable bowel syndrome)   . Legally blind   . MVP (mitral valve prolapse)   . Paroxysmal atrial fibrillation with rapid ventricular response ([place]) 08/29/2017  . Peripheral vascular disease ([place])   . RAD (reactive airway disease)     Past Surgical History:  Procedure Laterality Date  . ABDOMINAL HYSTERECTOMY     with USO for dysfunctionall menses  . APPENDECTOMY    . ARTERY BIOPSY Bilateral 07/21/2017   Procedure: BIOPSY BILATERAL TEMPORAL ARTERY;  Surgeon: [name], MD;  Location: [facility];  Service: Vascular;  Laterality: Bilateral;  . CARDIOVERSION N/A 06/16/2018   Procedure: CARDIOVERSION;  Surgeon: [name], MD;  Location: [facility];  Service: Cardiovascular;  Laterality: N/A;  . CATARACT EXTRACTION W/ INTRAOCULAR LENS IMPLANT  2013   bilateral; Dr [name]  . COLONOSCOPY  2013   negative , due 2018; Dr [name]  . COLONOSCOPY W/ POLYPECTOMY      X2 ; diverticulosis  . EMBOLECTOMY Right 08/29/2017   Procedure: Right Groin Exploration  ;  Surgeon: Rosetta Posner, MD;  Location: [facility];  Service: Vascular;  Laterality: Right;  . HIP PINNING,CANNULATED Right 03/21/2018  . HIP PINNING,CANNULATED Right 03/21/2018   Procedure: CANNULATED HIP PINNING;  Surgeon: Rod Can, MD;  Location: [facility];  Service: Orthopedics;  Laterality: Right;  . LUMBAR LAMINECTOMY  2000   Dr Vertell Limber  . SHOULDER SURGERY     R shoulder  .  TONSILLECTOMY      Social History   Socioeconomic History  . Marital status: Married    Spouse name: Not on file  . Number of children: Not on file  . Years of education: Not on file  . Highest education level: Not on file  Occupational History  . Occupation: retired  Scientific laboratory technician  . Financial resource strain: Not on file  . Food insecurity    Worry: Not on file    Inability: Not on file  . Transportation needs    Medical: Not on file    Non-medical: Not on file  Tobacco Use  . Smoking status: Former Smoker    Packs/day: 1.00    Years: 23.00    Pack years: 23.00    Quit date: 06/14/1989    Years since quitting: 29.5  . Smokeless tobacco: Never Used  . Tobacco comment: smoked 1961-1991 , up to < 1 ppd  Substance and Sexual Activity  . Alcohol use: Yes    Alcohol/week: 14.0 standard drinks    Types: 14 Glasses of wine per week    Comment: Wine  . Drug use: No  . Sexual activity: Not on file  Lifestyle  . Physical activity    Days per week: Not on file    Minutes per session: Not on file  . Stress: Not on file  Relationships  . Social Herbalist on phone: Not on file    Gets together: Not on file    Attends religious service: Not on file    Active member of club or  organization: Not on file    Attends meetings of clubs or organizations: Not on file    Relationship status: Not on file  Other Topics Concern  . Not on file  Social History Narrative  . Not on file    Family History  Problem Relation Age of Onset  . Heart attack Brother 75  . Parkinsonism Mother   . Stroke Mother   . Heart attack Father        pre 40  . Diabetes Sister   . COPD Sister        X 3  . Other Son        suicide 2016  . Colon cancer Neg Hx   . Cancer Neg Hx   . Esophageal cancer Neg Hx   . Liver cancer Neg Hx   . Pancreatic cancer Neg Hx   . Rectal cancer Neg Hx   . Stomach cancer Neg Hx     Review of Systems  Constitutional: Negative for chills and fever.  Respiratory: Negative for cough, shortness of breath and wheezing.   Cardiovascular: Positive for leg swelling (compression socks). Negative for chest pain and palpitations.  Skin:       Area of itching-right abdomen  Neurological: Positive for light-headedness (occ, if gets up too quickly) and headaches (occ).  Psychiatric/Behavioral: Negative for sleep disturbance.       Objective:   Vitals:   01/02/19 1107  BP: 134/78  Pulse: 93  Resp: 16  Temp: 99.1 F (37.3 C)  SpO2: 94%   BP Readings from Last 3 Encounters:  01/02/19 134/78  08/14/18 128/62  08/09/18 (!) 150/64   Wt Readings from Last 3 Encounters:  01/02/19 127 lb (57.6 kg)  08/14/18 121 lb (54.9 kg)  08/09/18 113 lb (51.3 kg)   Body mass index is 21.8 kg/m.   Physical Exam    Constitutional: Appears well-developed  and well-nourished. No distress.  HENT:  Head: Normocephalic and atraumatic.  Neck: Neck supple. No tracheal deviation present. No thyromegaly present.  No cervical lymphadenopathy Cardiovascular: Normal rate, rhythm with some irregularity and normal heart sounds.  No murmur heard. No carotid bruit .  1+ bilateral lower extremity edema Pulmonary/Chest: Effort normal and breath sounds normal. No respiratory  distress. No has no wheezes. No rales.  Skin: Skin is warm and dry. Not diaphoretic.  Mild erythema right mid abdomen without visible rash-erythema appears to be from itching Psychiatric: Normal mood and affect. Behavior is normal.      Assessment & Plan:    See Problem List for Assessment and Plan of chronic medical problems.

## 2019-01-02 ENCOUNTER — Other Ambulatory Visit (INDEPENDENT_AMBULATORY_CARE_PROVIDER_SITE_OTHER): Payer: Medicare Other

## 2019-01-02 ENCOUNTER — Ambulatory Visit (INDEPENDENT_AMBULATORY_CARE_PROVIDER_SITE_OTHER): Payer: Medicare Other | Admitting: Internal Medicine

## 2019-01-02 ENCOUNTER — Other Ambulatory Visit: Payer: Self-pay

## 2019-01-02 ENCOUNTER — Encounter: Payer: Self-pay | Admitting: Internal Medicine

## 2019-01-02 VITALS — BP 134/78 | HR 93 | Temp 99.1°F | Resp 16 | Ht 64.0 in | Wt 127.0 lb

## 2019-01-02 DIAGNOSIS — M159 Polyosteoarthritis, unspecified: Secondary | ICD-10-CM

## 2019-01-02 DIAGNOSIS — F419 Anxiety disorder, unspecified: Secondary | ICD-10-CM

## 2019-01-02 DIAGNOSIS — L299 Pruritus, unspecified: Secondary | ICD-10-CM | POA: Insufficient documentation

## 2019-01-02 DIAGNOSIS — E039 Hypothyroidism, unspecified: Secondary | ICD-10-CM

## 2019-01-02 DIAGNOSIS — I480 Paroxysmal atrial fibrillation: Secondary | ICD-10-CM

## 2019-01-02 DIAGNOSIS — R600 Localized edema: Secondary | ICD-10-CM

## 2019-01-02 DIAGNOSIS — J449 Chronic obstructive pulmonary disease, unspecified: Secondary | ICD-10-CM

## 2019-01-02 DIAGNOSIS — E038 Other specified hypothyroidism: Secondary | ICD-10-CM

## 2019-01-02 DIAGNOSIS — M150 Primary generalized (osteo)arthritis: Secondary | ICD-10-CM

## 2019-01-02 DIAGNOSIS — E782 Mixed hyperlipidemia: Secondary | ICD-10-CM

## 2019-01-02 DIAGNOSIS — I10 Essential (primary) hypertension: Secondary | ICD-10-CM | POA: Diagnosis not present

## 2019-01-02 DIAGNOSIS — M8949 Other hypertrophic osteoarthropathy, multiple sites: Secondary | ICD-10-CM

## 2019-01-02 LAB — COMPREHENSIVE METABOLIC PANEL
ALT: 32 U/L (ref 0–35)
AST: 42 U/L — ABNORMAL HIGH (ref 0–37)
Albumin: 4.7 g/dL (ref 3.5–5.2)
Alkaline Phosphatase: 111 U/L (ref 39–117)
BUN: 17 mg/dL (ref 6–23)
CO2: 27 mEq/L (ref 19–32)
Calcium: 10.3 mg/dL (ref 8.4–10.5)
Chloride: 94 mEq/L — ABNORMAL LOW (ref 96–112)
Creatinine, Ser: 0.89 mg/dL (ref 0.40–1.20)
GFR: 61.85 mL/min (ref >60.00)
Glucose, Bld: 100 mg/dL — ABNORMAL HIGH (ref 70–99)
Potassium: 4.5 mEq/L (ref 3.5–5.1)
Sodium: 134 mEq/L — ABNORMAL LOW (ref 135–145)
Total Bilirubin: 0.7 mg/dL (ref 0.2–1.2)
Total Protein: 7.1 g/dL (ref 6.0–8.3)

## 2019-01-02 LAB — CBC WITH DIFFERENTIAL/PLATELET
Basophils Absolute: 0.1 10*3/uL (ref 0.0–0.1)
Basophils Relative: 0.8 % (ref 0.0–3.0)
Eosinophils Absolute: 0.1 10*3/uL (ref 0.0–0.7)
Eosinophils Relative: 0.5 % (ref 0.0–5.0)
HCT: 39.1 % (ref 36.0–46.0)
Hemoglobin: 12.8 g/dL (ref 12.0–15.0)
Lymphocytes Relative: 8.7 % — ABNORMAL LOW (ref 12.0–46.0)
Lymphs Abs: 1 10*3/uL (ref 0.7–4.0)
MCHC: 32.6 g/dL (ref 30.0–36.0)
MCV: 108.5 fl — ABNORMAL HIGH (ref 78.0–100.0)
Monocytes Absolute: 1 10*3/uL (ref 0.1–1.0)
Monocytes Relative: 8.7 % (ref 3.0–12.0)
Neutro Abs: 9.5 10*3/uL — ABNORMAL HIGH (ref 1.4–7.7)
Neutrophils Relative %: 81.3 % — ABNORMAL HIGH (ref 43.0–77.0)
Platelets: 382 10*3/uL (ref 150.0–400.0)
RBC: 3.6 Mil/uL — ABNORMAL LOW (ref 3.87–5.11)
RDW: 18.4 % — ABNORMAL HIGH (ref 11.5–15.5)
WBC: 11.7 10*3/uL — ABNORMAL HIGH (ref 4.0–10.5)

## 2019-01-02 LAB — LIPID PANEL
Cholesterol: 312 mg/dL — ABNORMAL HIGH (ref 0–200)
HDL: 166.3 mg/dL (ref >39.00)
LDL Cholesterol: 126 mg/dL — ABNORMAL HIGH (ref 0–99)
NonHDL: 145.78
Total CHOL/HDL Ratio: 2
Triglycerides: 99 mg/dL (ref 0.0–149.0)
VLDL: 19.8 mg/dL (ref 0.0–40.0)

## 2019-01-02 LAB — TSH: TSH: 11.37 u[IU]/mL — ABNORMAL HIGH (ref 0.35–4.50)

## 2019-01-02 LAB — HEMOGLOBIN A1C: Hgb A1c MFr Bld: 4.5 % — ABNORMAL LOW (ref 4.6–6.5)

## 2019-01-02 MED ORDER — TRIAMCINOLONE ACETONIDE 0.1 % EX CREA: 1.0000 "application " | TOPICAL_CREAM | Freq: Two times a day (BID) | CUTANEOUS | 0 refills | Status: DC

## 2019-01-02 NOTE — Assessment & Plan Note (Signed)
Asthma well-controlled Continue current regimen Theophylline, Symbicort, albuterol as needed

## 2019-01-02 NOTE — Assessment & Plan Note (Signed)
Check lipid panel, CMP Not currently on any medication

## 2019-01-02 NOTE — Assessment & Plan Note (Addendum)
Bilateral lower extremity edema Fairly controlled with current dose of Lasix and potassium Continue with 20 mg daily CMP

## 2019-01-02 NOTE — Assessment & Plan Note (Signed)
Clinically euthyroid Check tsh  Is currently not taking any medication-I have prescribed in the past but she really did not want to take it

## 2019-01-02 NOTE — Assessment & Plan Note (Signed)
Overall fairly controlled Continue Ativan at bedtime

## 2019-01-02 NOTE — Assessment & Plan Note (Signed)
BP well controlled Current regimen effective and well tolerated Continue current medications at current doses cmp  

## 2019-01-02 NOTE — Assessment & Plan Note (Signed)
Has been in sinus rhythm, mild irregularity in her rhythm on exam Following with cardiology On amiodarone, Eliquis, Cardizem We will check CBC, CMP, TSH

## 2019-01-02 NOTE — Patient Instructions (Addendum)
  Tests ordered today. Your results will be released to [place] (or called to you) after review.  If any changes need to be made, you will be notified at that same time.   Medications reviewed and updated.  Changes include :   Triamcinolone cream for the itching.  Your prescription(s) have been submitted to your pharmacy. Please take as directed and contact our office if you believe you are having problem(s) with the medication(s).   Please followup in 6 months

## 2019-01-02 NOTE — Assessment & Plan Note (Signed)
Itching right mid abdomen ?  Cause No change in products or new medications Trial of triamcinolone cream

## 2019-01-02 NOTE — Assessment & Plan Note (Signed)
Has osteoarthritis pain in several joints Taking tramadol and Tylenol as needed Effective Taking medication appropriately We will continue

## 2019-01-03 ENCOUNTER — Other Ambulatory Visit: Payer: Self-pay

## 2019-01-03 MED ORDER — MONTELUKAST SODIUM 10 MG PO TABS: 10.0000 mg | ORAL_TABLET | Freq: Every day | ORAL | 1 refills | Status: DC

## 2019-01-08 ENCOUNTER — Telehealth: Payer: Self-pay

## 2019-01-08 ENCOUNTER — Telehealth: Payer: Self-pay | Admitting: Internal Medicine

## 2019-01-08 DIAGNOSIS — E038 Other specified hypothyroidism: Secondary | ICD-10-CM

## 2019-01-08 DIAGNOSIS — D72829 Elevated white blood cell count, unspecified: Secondary | ICD-10-CM

## 2019-01-08 DIAGNOSIS — E039 Hypothyroidism, unspecified: Secondary | ICD-10-CM

## 2019-01-08 MED ORDER — LEVOTHYROXINE SODIUM 25 MCG PO TABS: 25.0000 ug | ORAL_TABLET | Freq: Every day | ORAL | 1 refills | Status: DC

## 2019-01-08 NOTE — Telephone Encounter (Signed)
Husband is aware of response

## 2019-01-08 NOTE — Telephone Encounter (Signed)
Spouse has called stating that patients white blood count and thyroid has been  higher than normal.  Is requesting follow up call.

## 2019-01-08 NOTE — Telephone Encounter (Signed)
Spoke with pts husband in regards to labs.

## 2019-01-08 NOTE — Telephone Encounter (Signed)
Her white blood cell count was elevated at the time that she lost her vision because she was on high-dose steroids.  We will recheck this when we recheck her thyroid function.  The white blood cell count is not related to the vision loss.

## 2019-01-08 NOTE — Telephone Encounter (Signed)
Copied from [place] [phone]. Topic: Quick Communication - See Telephone Encounter >> Jan 08, 2019 12:18 PM Loma Boston wrote: CRM for notification. See Telephone encounter for: 01/08/19. Pt husband said they do not understand about some of the medicines due to pls FU with him at [phone]   Pt has been given results of blood test but they have more questions and are extremely upset they can not speak with someone concerning wife's care. Want [facility] nurse to call

## 2019-01-08 NOTE — Telephone Encounter (Signed)
Spoke with husband and verified that I will sent in levothyroxine to pharmacy. Husband expressed concerns with elevated WBC. States that her WBC was high when she was having problems and lost her eye sight. Wants to make sure nothing further needs to be done.

## 2019-01-12 ENCOUNTER — Other Ambulatory Visit: Payer: Self-pay | Admitting: Internal Medicine

## 2019-01-12 NOTE — Progress Notes (Signed)
HPI: FU atrial fibrillation. 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 Developed leg pain 3/19 and found to be in atrial fibrillation; ultrasound revealed thrombotic occlusion of her right SFA with reconstitution of popliteal artery above the knee, no ultrasound evidence of plaque, this appears to be consistent with emboli. 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 Patient had a right buttock subcutaneous hematoma September 2019. Patient fell October 2019 and had a subcapital fracture of the right hip. She was in atrial fibrillation with rapid ventricular response at that time. She underwent repair of her hip fracture and was placed on amiodarone. Had cardioversion June 16, 2018. Carotid Dopplers March 2020 showed no stenosis on the right and 1 to 39% stenosis on the left.  Since last seen,there is no dyspnea, chest pain, palpitations or syncope.  She is using her cane and walker and her balance is improving.  Current Outpatient Medications  Medication Sig Dispense Refill  . albuterol (VENTOLIN HFA) 108 (90 Base) MCG/ACT inhaler INHALE 1 PUFF INTO THE LUNGS AS DIRECTED AS NEEDED 18 g 1  . alendronate (FOSAMAX) 70 MG tablet Take 70 mg by mouth every Monday.     Marland Kitchen amiodarone (PACERONE) 200 MG tablet Take 1 tablet (200 mg total) by mouth daily. 90 tablet 3  . Calcium Citrate-Vitamin D (CALCIUM + D PO) Take 1 tablet by mouth daily.    . Cholecalciferol (VITAMIN D3) 1000 units CAPS Take 1 capsule (1,000 Units total) by mouth daily with lunch. 60 capsule 0  . diltiazem (CARDIZEM CD) 120 MG 24 hr capsule TAKE ONE CAPSULE BY MOUTH DAILY 90 capsule 1  . ELIQUIS 5 MG TABS tablet TAKE  1 TABLET BY MOUTH TWO TIMES A DAY 180 tablet 1  . furosemide (LASIX) 40 MG tablet TAKE ONE TABLET BY MOUTH DAILY (Patient taking differently: Take 40 mg by mouth daily. ) 90 tablet 2  . gabapentin (NEURONTIN) 100 MG capsule Take 5 capsules (500 mg total) by mouth at bedtime. 30 capsule 0  . levothyroxine (SYNTHROID) 25 MCG tablet Take 1 tablet (25 mcg total) by mouth daily before breakfast. 30 tablet 1  . LORazepam (ATIVAN) 1 MG tablet TAKE ONE TABLET BY MOUTH EVERY NIGHT AT BEDTIME 30 tablet 0  . methocarbamol (ROBAXIN) 500 MG tablet Take 1 tablet (500 mg total) by mouth every 6 (six) hours as needed for muscle spasms. 120 tablet 3  . montelukast (SINGULAIR) 10 MG tablet Take 1 tablet (10 mg total) by mouth at bedtime. 90 tablet 1  . potassium chloride SA (K-DUR,KLOR-CON) 20 MEQ tablet TAKE TWO TABLETS BY MOUTH DAILY 180 tablet 0  . predniSONE (DELTASONE) 5 MG tablet Take 5 mg by mouth daily with breakfast.     . SYMBICORT 160-4.5 MCG/ACT inhaler INHALE ONE TO TWO PUFFS BY MOUTH EVERY 12 HOURS, THEN GARGLE AND SPIT AFTER USE 10.2 g 9  . theophylline (UNIPHYL) 400 MG 24 hr tablet TAKE 1 TABLET (400 MG TOTAL) BY MOUTH DAILY. (Patient taking differently: Take 200 mg by mouth 2 (two) times daily. ) 90 tablet 2  . traMADol (ULTRAM) 50 MG tablet Take 1 tablet (50 mg total) by mouth every 6 (six) hours as needed for moderate pain. 90 tablet 0  .  triamcinolone cream (KENALOG) 0.1 % Apply 1 application topically 2 (two) times daily. 30 g 0   No current facility-administered medications for this visit.      Past Medical History:  Diagnosis Date  . Adenomatous colon polyp 2007 & 2010    Dr [name]  . Allergy    seasonal  . Arthritis   . Cataract   . Diverticulosis   . 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 Hyperlipidemia   . Hypertension   . IBS (irritable bowel syndrome)   . Legally blind   . MVP (mitral valve prolapse)   . Paroxysmal atrial fibrillation with rapid ventricular  response ([place]) 08/29/2017  . Peripheral vascular disease ([place])   . RAD (reactive airway disease)     Past Surgical History:  Procedure Laterality Date  . ABDOMINAL HYSTERECTOMY     with USO for dysfunctionall menses  . APPENDECTOMY    . ARTERY BIOPSY Bilateral 07/21/2017   Procedure: BIOPSY BILATERAL TEMPORAL ARTERY;  Surgeon: [name], MD;  Location: [facility];  Service: Vascular;  Laterality: Bilateral;  . CARDIOVERSION N/A 06/16/2018   Procedure: CARDIOVERSION;  Surgeon: [name], MD;  Location: [facility];  Service: Cardiovascular;  Laterality: N/A;  . CATARACT EXTRACTION W/ INTRAOCULAR LENS IMPLANT  2013   bilateral; Dr [name]  . COLONOSCOPY  2013   negative , due 2018; Dr [name]  . COLONOSCOPY W/ POLYPECTOMY      X2 ; diverticulosis  . EMBOLECTOMY Right 08/29/2017   Procedure: Right Groin Exploration  ;  Surgeon: Rosetta Posner, MD;  Location: [facility];  Service: Vascular;  Laterality: Right;  . HIP PINNING,CANNULATED Right 03/21/2018  . HIP PINNING,CANNULATED Right 03/21/2018   Procedure: CANNULATED HIP PINNING;  Surgeon: Rod Can, MD;  Location: [facility];  Service: Orthopedics;  Laterality: Right;  . LUMBAR LAMINECTOMY  2000   Dr Vertell Limber  . SHOULDER SURGERY     R shoulder  . TONSILLECTOMY      Social History   Socioeconomic History  . Marital status: Married    Spouse name: Not on file  . Number of children: Not on file  . Years of education: Not on file  . Highest education level: Not on file  Occupational History  . Occupation: retired  Scientific laboratory technician  . Financial resource strain: Not on file  . Food insecurity    Worry: Not on file    Inability: Not on file  . Transportation needs    Medical: Not on file    Non-medical: Not on file  Tobacco Use  . Smoking status: Former Smoker    Packs/day: 1.00    Years: 23.00    Pack years: 23.00    Quit date: 06/14/1989    Years since quitting: 29.6  . Smokeless tobacco: Never Used  . Tobacco comment:  smoked 1961-1991 , up to < 1 ppd  Substance and Sexual Activity  . Alcohol use: Yes    Alcohol/week: 14.0 standard drinks    Types: 14 Glasses of wine per week    Comment: Wine  . Drug use: No  . Sexual activity: Not on file  Lifestyle  . Physical activity    Days per week: Not on file    Minutes per session: Not on file  . Stress: Not on file  Relationships  . Social Herbalist on phone: Not on file    Gets together: Not on file    Attends religious service: Not  on file    Active member of club or organization: Not on file    Attends meetings of clubs or organizations: Not on file    Relationship status: Not on file  . Intimate partner violence    Fear of current or ex partner: Not on file    Emotionally abused: Not on file    Physically abused: Not on file    Forced sexual activity: Not on file  Other Topics Concern  . Not on file  Social History Narrative  . Not on file    Family History  Problem Relation Age of Onset  . Heart attack Brother 1  . Parkinsonism Mother   . Stroke Mother   . Heart attack Father        pre 55  . Diabetes Sister   . COPD Sister        X 3  . Other Son        suicide 2016  . Colon cancer Neg Hx   . Cancer Neg Hx   . Esophageal cancer Neg Hx   . Liver cancer Neg Hx   . Pancreatic cancer Neg Hx   . Rectal cancer Neg Hx   . Stomach cancer Neg Hx     ROS: no fevers or chills, productive cough, hemoptysis, dysphasia, odynophagia, melena, hematochezia, dysuria, hematuria, rash, seizure activity, orthopnea, PND, pedal edema, claudication. Remaining systems are negative.  Physical Exam: Well-developed well-nourished in no acute distress.  Skin is warm and dry.  HEENT is normal. Blind Neck is supple.  Chest is clear to auscultation with normal expansion.  Cardiovascular exam is regular rate and rhythm.  Abdominal exam nontender or distended. No masses palpated. Extremities show no edema. neuro grossly intact  A/P  1  paroxysmal atrial fibrillation/flutter-patient remains in sinus rhythm today on exam.  Continue amiodarone at present dose.  Check PA and lateral chest x-ray.  Patient had recent liver functions and TSH checked (synthroid added).  We previously discussed the risks and benefits of anticoagulation.  Because of falls previously I recommended discontinuing but she declined stating she would want to avoid CVA at any cost.  She has not had any further falls.  We will continue with apixaban.  Can consider if she has more frequent falls in the future.  2 hypertension-patient's blood pressure is controlled today.  Continue present medications and follow.  3 hyperlipidemia-continue statin.  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  We will continue with medical therapy including statin.  She is not on aspirin given need for apixaban.  5 giant cell arteritis-managed by primary care.  6 hypothyroid-Synthroid has been recommended by Dr. [name].  I have reinforced this today.  [name], MD

## 2019-01-14 ENCOUNTER — Other Ambulatory Visit: Payer: Self-pay | Admitting: Internal Medicine

## 2019-01-15 NOTE — Telephone Encounter (Signed)
Not on med list, please advise 

## 2019-01-17 ENCOUNTER — Other Ambulatory Visit: Payer: Self-pay

## 2019-01-17 ENCOUNTER — Ambulatory Visit (INDEPENDENT_AMBULATORY_CARE_PROVIDER_SITE_OTHER): Payer: Medicare Other | Admitting: Cardiology

## 2019-01-17 ENCOUNTER — Encounter: Payer: Self-pay | Admitting: Cardiology

## 2019-01-17 VITALS — BP 142/68 | HR 86 | Temp 97.3°F | Ht 64.0 in | Wt 127.0 lb

## 2019-01-17 DIAGNOSIS — I10 Essential (primary) hypertension: Secondary | ICD-10-CM

## 2019-01-17 DIAGNOSIS — I480 Paroxysmal atrial fibrillation: Secondary | ICD-10-CM | POA: Diagnosis not present

## 2019-01-17 DIAGNOSIS — E7800 Pure hypercholesterolemia, unspecified: Secondary | ICD-10-CM

## 2019-01-17 NOTE — Patient Instructions (Signed)
Medication Instructions:  NO CHANGE If you need a refill on your cardiac medications before your next appointment, please call your pharmacy.   Lab work: If you have labs (blood work) drawn today and your tests are completely normal, you will receive your results only by: Marland Kitchen MyChart Message (if you have MyChart) OR . A paper copy in the mail If you have any lab test that is abnormal or we need to change your treatment, we will call you to review the results.  Testing/Procedures: A chest x-ray takes a picture of the organs and structures inside the chest, including the heart, lungs, and blood vessels. This test can show several things, including, whether the heart is enlarges; whether fluid is building up in the lungs; and whether pacemaker / defibrillator leads are still in place. [place]  Follow-Up: At [facility], you and your health needs are our priority.  As part of our continuing mission to provide you with exceptional heart care, we have created designated Provider Care Teams.  These Care Teams include your primary Cardiologist (physician) and Advanced Practice Providers (APPs -  Physician Assistants and Nurse Practitioners) who all work together to provide you with the care you need, when you need it. You will need a follow up appointment in 6 months.  Please call our office 2 months in advance to schedule this appointment.  You may see [name], MD or one of the following Advanced Practice Providers on your designated Care Team:   [name], PA-C Roby Lofts, [place] . Sande Rives, PA-C

## 2019-01-22 DIAGNOSIS — M810 Age-related osteoporosis without current pathological fracture: Secondary | ICD-10-CM | POA: Diagnosis not present

## 2019-01-22 DIAGNOSIS — Z7901 Long term (current) use of anticoagulants: Secondary | ICD-10-CM | POA: Diagnosis not present

## 2019-01-22 DIAGNOSIS — Z87310 Personal history of (healed) osteoporosis fracture: Secondary | ICD-10-CM | POA: Diagnosis not present

## 2019-01-22 DIAGNOSIS — M316 Other giant cell arteritis: Secondary | ICD-10-CM | POA: Diagnosis not present

## 2019-01-22 DIAGNOSIS — Z7952 Long term (current) use of systemic steroids: Secondary | ICD-10-CM | POA: Diagnosis not present

## 2019-01-22 DIAGNOSIS — Z79899 Other long term (current) drug therapy: Secondary | ICD-10-CM | POA: Diagnosis not present

## 2019-01-22 DIAGNOSIS — I480 Paroxysmal atrial fibrillation: Secondary | ICD-10-CM | POA: Diagnosis not present

## 2019-01-26 ENCOUNTER — Other Ambulatory Visit: Payer: Self-pay | Admitting: Internal Medicine

## 2019-01-28 ENCOUNTER — Other Ambulatory Visit: Payer: Self-pay | Admitting: Internal Medicine

## 2019-01-29 NOTE — Telephone Encounter (Signed)
Last OV 01/02/19 Next OV 07/04/18 Last RF 01/02/19

## 2019-01-31 ENCOUNTER — Ambulatory Visit
Admission: RE | Admit: 2019-01-31 | Discharge: 2019-01-31 | Disposition: A | Discharge status: Home / Self Care | Payer: Medicare Other | Source: Ambulatory Visit | Attending: Cardiology | Admitting: Cardiology

## 2019-01-31 ENCOUNTER — Other Ambulatory Visit (INDEPENDENT_AMBULATORY_CARE_PROVIDER_SITE_OTHER): Payer: Medicare Other

## 2019-01-31 ENCOUNTER — Other Ambulatory Visit: Payer: Self-pay

## 2019-01-31 DIAGNOSIS — D72829 Elevated white blood cell count, unspecified: Secondary | ICD-10-CM

## 2019-01-31 DIAGNOSIS — I480 Paroxysmal atrial fibrillation: Secondary | ICD-10-CM

## 2019-01-31 DIAGNOSIS — E039 Hypothyroidism, unspecified: Secondary | ICD-10-CM

## 2019-01-31 DIAGNOSIS — E038 Other specified hypothyroidism: Secondary | ICD-10-CM

## 2019-01-31 DIAGNOSIS — E8770 Fluid overload, unspecified: Secondary | ICD-10-CM | POA: Diagnosis not present

## 2019-01-31 LAB — CBC WITH DIFFERENTIAL/PLATELET
Basophils Absolute: 0.2 10*3/uL — ABNORMAL HIGH (ref 0.0–0.1)
Basophils Relative: 0.9 % (ref 0.0–3.0)
Eosinophils Absolute: 0.1 10*3/uL (ref 0.0–0.7)
Eosinophils Relative: 0.6 % (ref 0.0–5.0)
HCT: 41.1 % (ref 36.0–46.0)
Hemoglobin: 13.7 g/dL (ref 12.0–15.0)
Lymphocytes Relative: 9.6 % — ABNORMAL LOW (ref 12.0–46.0)
Lymphs Abs: 1.6 10*3/uL (ref 0.7–4.0)
MCHC: 33.4 g/dL (ref 30.0–36.0)
MCV: 107.2 fl — ABNORMAL HIGH (ref 78.0–100.0)
Monocytes Absolute: 2 10*3/uL — ABNORMAL HIGH (ref 0.1–1.0)
Monocytes Relative: 12 % (ref 3.0–12.0)
Neutro Abs: 13.1 10*3/uL — ABNORMAL HIGH (ref 1.4–7.7)
Neutrophils Relative %: 76.9 % (ref 43.0–77.0)
Platelets: 452 10*3/uL — ABNORMAL HIGH (ref 150.0–400.0)
RBC: 3.83 Mil/uL — ABNORMAL LOW (ref 3.87–5.11)
RDW: 17.9 % — ABNORMAL HIGH (ref 11.5–15.5)
WBC: 17 10*3/uL — ABNORMAL HIGH (ref 4.0–10.5)

## 2019-01-31 LAB — TSH: TSH: 13.62 u[IU]/mL — ABNORMAL HIGH (ref 0.35–4.50)

## 2019-02-01 ENCOUNTER — Emergency Department (HOSPITAL_COMMUNITY): Payer: Medicare Other

## 2019-02-01 ENCOUNTER — Other Ambulatory Visit: Payer: Self-pay

## 2019-02-01 ENCOUNTER — Emergency Department (HOSPITAL_COMMUNITY)
Admission: EM | Admit: 2019-02-01 | Discharge: 2019-02-01 | Disposition: A | Discharge status: Home / Self Care | Payer: Medicare Other | Attending: Emergency Medicine | Admitting: Emergency Medicine

## 2019-02-01 ENCOUNTER — Other Ambulatory Visit: Payer: Self-pay | Admitting: Internal Medicine

## 2019-02-01 DIAGNOSIS — W010XXA Fall on same level from slipping, tripping and stumbling without subsequent striking against object, initial encounter: Secondary | ICD-10-CM | POA: Insufficient documentation

## 2019-02-01 DIAGNOSIS — Z79899 Other long term (current) drug therapy: Secondary | ICD-10-CM | POA: Diagnosis not present

## 2019-02-01 DIAGNOSIS — S0003XA Contusion of scalp, initial encounter: Secondary | ICD-10-CM | POA: Diagnosis not present

## 2019-02-01 DIAGNOSIS — Y9301 Activity, walking, marching and hiking: Secondary | ICD-10-CM | POA: Insufficient documentation

## 2019-02-01 DIAGNOSIS — R52 Pain, unspecified: Secondary | ICD-10-CM | POA: Diagnosis not present

## 2019-02-01 DIAGNOSIS — W19XXXA Unspecified fall, initial encounter: Secondary | ICD-10-CM

## 2019-02-01 DIAGNOSIS — Y929 Unspecified place or not applicable: Secondary | ICD-10-CM | POA: Diagnosis not present

## 2019-02-01 DIAGNOSIS — J45909 Unspecified asthma, uncomplicated: Secondary | ICD-10-CM | POA: Diagnosis not present

## 2019-02-01 DIAGNOSIS — I10 Essential (primary) hypertension: Secondary | ICD-10-CM | POA: Insufficient documentation

## 2019-02-01 DIAGNOSIS — J449 Chronic obstructive pulmonary disease, unspecified: Secondary | ICD-10-CM | POA: Diagnosis not present

## 2019-02-01 DIAGNOSIS — D72825 Bandemia: Secondary | ICD-10-CM

## 2019-02-01 DIAGNOSIS — R0902 Hypoxemia: Secondary | ICD-10-CM | POA: Diagnosis not present

## 2019-02-01 DIAGNOSIS — Z7901 Long term (current) use of anticoagulants: Secondary | ICD-10-CM | POA: Insufficient documentation

## 2019-02-01 DIAGNOSIS — Y999 Unspecified external cause status: Secondary | ICD-10-CM | POA: Insufficient documentation

## 2019-02-01 DIAGNOSIS — E039 Hypothyroidism, unspecified: Secondary | ICD-10-CM

## 2019-02-01 DIAGNOSIS — S4992XA Unspecified injury of left shoulder and upper arm, initial encounter: Secondary | ICD-10-CM | POA: Diagnosis not present

## 2019-02-01 DIAGNOSIS — Z87891 Personal history of nicotine dependence: Secondary | ICD-10-CM | POA: Diagnosis not present

## 2019-02-01 DIAGNOSIS — M25512 Pain in left shoulder: Secondary | ICD-10-CM | POA: Diagnosis not present

## 2019-02-01 DIAGNOSIS — S199XXA Unspecified injury of neck, initial encounter: Secondary | ICD-10-CM | POA: Diagnosis not present

## 2019-02-01 DIAGNOSIS — S0990XA Unspecified injury of head, initial encounter: Secondary | ICD-10-CM | POA: Diagnosis not present

## 2019-02-01 MED ORDER — LEVOTHYROXINE SODIUM 50 MCG PO TABS: 50.0000 ug | ORAL_TABLET | Freq: Every day | ORAL | 3 refills | Status: DC

## 2019-02-01 MED ORDER — ACETAMINOPHEN 325 MG PO TABS
650.0000 mg | ORAL_TABLET | Freq: Once | ORAL | Status: AC
  Administered 2019-02-01: 650 mg via ORAL
  Filled 2019-02-01: qty 2

## 2019-02-01 NOTE — Discharge Instructions (Signed)
Adult head injury: You have had a head injury which does not appear to require admission at this time. A concussion is a state of changed mental ability from trauma.  SEEK IMMEDIATE MEDICAL ATTENTION IF:  - There is confusion or drowsiness (although children frequently become drowsy after injury). You cannot awaken the injuredperson.   - There is nausea (feeling sick to your stomach) or continued, forceful vomiting. You notice dizziness or unsteadiness which is getting worse, or inability to walk.   - You have convulsions or unconsciousness.   - You experience severe, persistent headaches not relieved by Tylenol. (Do not take aspirin as this impairs clotting abilities). Take other pain medications only as directed.   - You cannot use arms or legs normally.   - There are changes in pupil sizes. (This is the black center in the colored part of the eye)   - There is clear or bloody discharge from the nose or ears.   - Change in speech, swallowing, or understanding.   - Localized weakness, numbness, tingling, or change in bowel or bladder control.

## 2019-02-01 NOTE — ED Provider Notes (Signed)
[facility] EMERGENCY DEPARTMENT Provider Note   CSN: [id number] Arrival date & time: 02/01/19  1319     History   Chief Complaint Chief Complaint  Patient presents with   Fall    HPI Crystal [name] is a 73 y.o. female with past medical history significant for legally blind, hypertension, PAD, A fib on eliquis to emergency department today with chief complaint of fall.  This happened 1 hour prior to arrival.  Patient states she was walking outside with her friend testing out her new cane when she tripped on uneven ground.  She fell backwards and hit her head.  She denies loss of consciousness.  She was able to get up on her own has been ambulatory since.  She is reporting pain aching pain to her left shoulder.  Patient states she has missed several doses of eliquis lately.   She denies fever, chills, neck pain, chest pain, shortness of breath, abdominal pain, nausea, vomiting, diarrhea. History provided by patient with additional history obtained from chart review.       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   Hyperlipidemia    Hypertension    IBS (irritable bowel syndrome)    Legally blind    MVP (mitral valve prolapse)    Paroxysmal atrial fibrillation with rapid ventricular response ([place]) 08/29/2017   Peripheral vascular disease ([place])    RAD (reactive airway disease)     Patient Active Problem List   Diagnosis Date Noted   Pruritus 01/02/2019   Rib pain on left side 08/09/2018   Hypokalemia 08/09/2018   Long term (current) use of anticoagulants 08/01/2018   Pre-operative clearance 08/01/2018   Closed compression fracture of L4 vertebra ([place]) 07/11/2018   Bilateral leg edema 07/11/2018   Lung nodule < 6cm on CT 07/11/2018   Subclinical hypothyroidism 04/11/2018    Fracture of femoral neck, right ([place]) 03/22/2018   PAF (paroxysmal atrial fibrillation) ([place]) 03/18/2018   Femoral artery thrombosis, 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   Herpes zoster without complication 40/98/1191   Jaw claudication 07/27/2017   Temporal arteritis ([place]) 07/18/2017   Vision, loss, sudden, bilateral 07/18/2017   TMJ arthralgia 06/27/2017   Osteoarthritis 06/25/2016   Anxiety 06/25/2016   Back pain 07/18/2015   Hyperuricemia 06/28/2011   Carotid bruit present 06/05/2010   Osteoporosis 06/03/2008   History of colonic polyps 06/03/2008   INTENTION TREMOR 12/06/2007   IBS 10/16/2007   Hyperlipidemia 06/06/2007   Essential hypertension 06/06/2007   Chronic obstructive airway disease with asthma ([place]) 02/23/2007    Past Surgical History:  Procedure Laterality Date   ABDOMINAL HYSTERECTOMY     with USO for dysfunctionall menses   APPENDECTOMY     ARTERY BIOPSY Bilateral 07/21/2017   Procedure: BIOPSY BILATERAL TEMPORAL ARTERY;  Surgeon: [name], MD;  Location: [facility] OR;  Service: Vascular;  Laterality: Bilateral;   CARDIOVERSION N/A 06/16/2018   Procedure: CARDIOVERSION;  Surgeon: [name], MD;  Location: [facility] ENDOSCOPY;  Service: Cardiovascular;  Laterality: N/A;   CATARACT EXTRACTION W/ INTRAOCULAR LENS IMPLANT  2013   bilateral; Dr [name]   COLONOSCOPY  2013   negative , due 2018; Dr [name]   COLONOSCOPY W/ POLYPECTOMY      X2 ; diverticulosis   EMBOLECTOMY  Right 08/29/2017   Procedure: Right Groin Exploration  ;  Surgeon: Rosetta Posner, MD;  Location: [facility];  Service: Vascular;  Laterality: Right;   HIP PINNING,CANNULATED Right 03/21/2018   HIP PINNING,CANNULATED Right 03/21/2018   Procedure: CANNULATED HIP PINNING;  Surgeon: Rod Can, MD;  Location: [facility];  Service: Orthopedics;  Laterality: Right;   LUMBAR LAMINECTOMY  2000   Dr Vertell Limber   SHOULDER SURGERY     R shoulder   TONSILLECTOMY       OB History   No  obstetric history on file.      Home Medications    Prior to Admission medications   Medication Sig Start Date End Date Taking? Authorizing Provider  albuterol (VENTOLIN HFA) 108 (90 Base) MCG/ACT inhaler INHALE 1 PUFF INTO THE LUNGS AS DIRECTED AS NEEDED 01/12/19   [name], MD  alendronate (FOSAMAX) 70 MG tablet Take 70 mg by mouth every Monday.  01/27/18   [provider]  amiodarone (PACERONE) 200 MG tablet Take 1 tablet (200 mg total) by mouth daily. 08/14/18   [name], MD  Calcium Citrate-Vitamin D (CALCIUM + D PO) Take 1 tablet by mouth daily.    [provider]  Cholecalciferol (VITAMIN D3) 1000 units CAPS Take 1 capsule (1,000 Units total) by mouth daily with lunch. 03/30/18   Angiulli, Lavon Paganini, PA-C  diltiazem (CARDIZEM CD) 120 MG 24 hr capsule TAKE ONE CAPSULE BY MOUTH DAILY 08/28/18   [name], MD  ELIQUIS 5 MG TABS tablet TAKE 1 TABLET BY MOUTH TWO TIMES A DAY 11/14/18   [name], MD  furosemide (LASIX) 40 MG tablet TAKE ONE TABLET BY MOUTH DAILY Patient taking differently: Take 40 mg by mouth daily.  07/10/18   [name], MD  gabapentin (NEURONTIN) 100 MG capsule Take 5 capsules (500 mg total) by mouth at bedtime. 03/30/18   Angiulli, Lavon Paganini, PA-C  levothyroxine (SYNTHROID) 25 MCG tablet Take 1 tablet (25 mcg total) by mouth daily before breakfast. 01/08/19   Burns, Claudina Lick, MD  LORazepam (ATIVAN) 1 MG tablet TAKE ONE TABLET BY MOUTH EVERY NIGHT AT BEDTIME 01/29/19   Burns, Claudina Lick, MD  methocarbamol (ROBAXIN) 500 MG tablet Take 1 tablet (500 mg total) by mouth every 6 (six) hours as needed for muscle spasms. 07/11/18   [name], MD  montelukast (SINGULAIR) 10 MG tablet Take 1 tablet (10 mg total) by mouth at bedtime. 01/03/19   [name], MD  potassium chloride SA (K-DUR,KLOR-CON) 20 MEQ tablet TAKE TWO TABLETS BY MOUTH DAILY 09/18/18   Burns, Claudina Lick, MD  predniSONE (DELTASONE) 5 MG tablet Take 5 mg by mouth daily with  breakfast.     [provider]  SYMBICORT 160-4.5 MCG/ACT inhaler INHALE ONE TO TWO PUFFS BY MOUTH EVERY 12 HOURS, THEN GARGLE AND SPIT AFTER USE 11/16/18   [name], MD  theophylline (UNIPHYL) 400 MG 24 hr tablet TAKE 1 TABLET (400 MG TOTAL) BY MOUTH DAILY. Patient taking differently: Take 200 mg by mouth 2 (two) times daily.  07/10/18   [name], MD  traMADol (ULTRAM) 50 MG tablet Take 1 tablet (50 mg total) by mouth every 6 (six) hours as needed for moderate pain. 08/09/18   [name], MD  triamcinolone cream (KENALOG) 0.1 % Apply 1 application topically 2 (two) times daily. 01/02/19   [name], MD    Family History Family History  Problem Relation  Age of Onset   Heart attack Brother 27   Parkinsonism Mother    Stroke Mother    Heart attack Father        pre 71   Diabetes Sister    COPD Sister        X 3   Other Son        suicide 2016   Colon cancer Neg Hx    Cancer Neg Hx    Esophageal cancer Neg Hx    Liver cancer Neg Hx    Pancreatic cancer Neg Hx    Rectal cancer Neg Hx    Stomach cancer Neg Hx     Social History Social History   Tobacco Use   Smoking status: Former Smoker    Packs/day: 1.00    Years: 23.00    Pack years: 23.00    Quit date: 06/14/1989    Years since quitting: 29.6   Smokeless tobacco: Never Used   Tobacco comment: smoked 1961-1991 , up to < 1 ppd  Substance Use Topics   Alcohol use: Yes    Alcohol/week: 14.0 standard drinks    Types: 14 Glasses of wine per week    Comment: Wine   Drug use: No     Allergies   Simvastatin   Review of Systems Review of Systems  Constitutional: Negative for chills and fever.  HENT: Negative for congestion, ear discharge, ear pain, sinus pressure, sinus pain and sore throat.   Eyes: Negative for pain and redness.  Respiratory: Negative for cough and shortness of breath.   Cardiovascular: Negative for chest pain.  Gastrointestinal: Negative for abdominal  pain, constipation, diarrhea, nausea and vomiting.  Genitourinary: Negative for dysuria and hematuria.  Musculoskeletal: Positive for arthralgias, back pain (chronic, unchanged today) and neck pain (chronic, unchanged today).  Skin: Negative for wound.  Neurological: Negative for weakness, numbness and headaches.     Physical Exam Updated Vital Signs BP 123/60 (BP Location: Right Arm)    Pulse 94    Temp 98.7 F (37.1 C) (Oral)    Resp 17    SpO2 100%   Physical Exam Vitals signs and nursing note reviewed.  Constitutional:      General: She is not in acute distress.    Appearance: She is not ill-appearing.     Comments: Pt is blind  HENT:     Head: Normocephalic. No raccoon eyes or Battle's sign.     Jaw: There is normal jaw occlusion.     Comments: Tenderness to palpation over posterior scalp with small hematoma. No open wound.  No deformities or crepitus noted.     Right Ear: Tympanic membrane and external ear normal. No hemotympanum.     Left Ear: Tympanic membrane and external ear normal. No hemotympanum.     Nose: Nose normal. No nasal tenderness.     Mouth/Throat:     Mouth: Mucous membranes are moist.     Pharynx: Oropharynx is clear.  Eyes:     General: No scleral icterus.       Right eye: No discharge.        Left eye: No discharge.     Extraocular Movements: Extraocular movements intact.     Conjunctiva/sclera: Conjunctivae normal.     Pupils: Pupils are equal, round, and reactive to light.  Neck:     Musculoskeletal: Normal range of motion.     Vascular: No JVD.     Comments: Full ROM intact without spinous process TTP. No  bony stepoffs or deformities, no paraspinous muscle TTP or muscle spasms. No rigidity or meningeal signs. No bruising, erythema, or swelling.   Cardiovascular:     Rate and Rhythm: Normal rate and regular rhythm.     Pulses: Normal pulses.          Radial pulses are 2+ on the right side and 2+ on the left side.     Heart sounds: Normal  heart sounds.  Pulmonary:     Comments: Lungs clear to auscultation in all fields. Symmetric chest rise. No wheezing, rales, or rhonchi. Chest:     Chest wall: No tenderness.  Abdominal:     Comments: Abdomen is soft, non-distended, and non-tender in all quadrants. No rigidity, no guarding. No peritoneal signs.  Musculoskeletal:     Right shoulder: Normal.     Left shoulder: She exhibits decreased range of motion and tenderness. She exhibits no swelling, no effusion, no crepitus, no deformity and no laceration.     Right lower leg: No edema.     Left lower leg: No edema.     Comments: Full range of motion of the T-spine and L-spine No tenderness to palpation of the spinous processes of the T-spine or L-spine No crepitus, deformity or step-offs No tenderness to palpation of the paraspinous muscles of the L-spine   Skin:    General: Skin is warm and dry.     Capillary Refill: Capillary refill takes less than 2 seconds.  Neurological:     Mental Status: She is oriented to person, place, and time.     GCS: GCS eye subscore is 4. GCS verbal subscore is 5. GCS motor subscore is 6.     Comments: Mental Status:  Alert, oriented, thought content appropriate, able to give a coherent history. Speech fluent without evidence of aphasia. Able to follow 2 step commands without difficulty.  Cranial Nerves:  V,VII: smile symmetric, facial light touch sensation equal VIII: hearing grossly normal to voice  X: uvula elevates symmetrically  XI: bilateral shoulder shrug symmetric and strong XII: midline tongue extension without fassiculations Motor:  Normal tone. 5/5 in upper and lower extremities bilaterally including strong and equal grip strength and dorsiflexion/plantar flexion Sensory: Pinprick and light touch normal in all extremities.  Deep Tendon Reflexes: 2+ and symmetric in the biceps and patella Gait: normal gait and balance CV: distal pulses palpable throughout     Psychiatric:         Behavior: Behavior normal.      ED Treatments / Results  Labs (all labs ordered are listed, but only abnormal results are displayed) Labs Reviewed - No data to display  EKG EKG Interpretation  Date/Time:  Thursday February 01 2019 14:44:31 EDT Ventricular Rate:  109 PR Interval:    QRS Duration: 97 QT Interval:  349 QTC Calculation: 470 R Axis:   -5 Text Interpretation:  Sinus arrhythmia Multiform ventricular premature complexes Anterior infarct, old Confirmed by Elnora Morrison [id number]) on 02/01/2019 3:01:34 PM   Radiology Dg Chest 2 View  Result Date: 01/31/2019 CLINICAL DATA:  In the overload surveillance EXAM: CHEST - 2 VIEW COMPARISON:  08/09/2018 FINDINGS: Heart and mediastinal contours are within normal limits. No focal opacities or effusions. No acute bony abnormality. IMPRESSION: No active cardiopulmonary disease. Electronically Signed   By: Rolm Baptise M.D.   On: 01/31/2019 13:00   Ct Head Wo Contrast  Result Date: 02/01/2019 CLINICAL DATA:  Head injury after fall. EXAM: CT HEAD WITHOUT CONTRAST CT CERVICAL SPINE  WITHOUT CONTRAST TECHNIQUE: Multidetector CT imaging of the head and cervical spine was performed following the standard protocol without intravenous contrast. Multiplanar CT image reconstructions of the cervical spine were also generated. COMPARISON:  CT scan of August 04, 2018. FINDINGS: CT HEAD FINDINGS Brain: Minimal chronic ischemic white matter disease is noted. No mass effect or midline shift is noted. Ventricular size is within normal limits. There is no evidence of mass lesion, hemorrhage or acute infarction. Vascular: No hyperdense vessel or unexpected calcification. Skull: Normal. Negative for fracture or focal lesion. Sinuses/Orbits: No acute finding. Other: Small posterior scalp hematoma is noted. CT CERVICAL SPINE FINDINGS Alignment: Minimal grade 1 anterolisthesis of C4-5 is noted secondary to posterior facet joint hypertrophy. Skull base and vertebrae:  No acute fracture. No primary bone lesion or focal pathologic process. Soft tissues and spinal canal: No prevertebral fluid or swelling. No visible canal hematoma. Disc levels: Severe degenerative disc disease is noted at C5-6 and C6-7 with anterior posterior osteophyte formation. Upper chest: Negative. Other: Degenerative changes are seen involving posterior facet joints bilaterally. IMPRESSION: Small posterior scalp hematoma. No acute intracranial abnormality seen. Severe multilevel degenerative disc disease. No acute abnormality seen in the cervical spine. Electronically Signed   By: Marijo Conception M.D.   On: 02/01/2019 14:54   Ct Cervical Spine Wo Contrast  Result Date: 02/01/2019 CLINICAL DATA:  Head injury after fall. EXAM: CT HEAD WITHOUT CONTRAST CT CERVICAL SPINE WITHOUT CONTRAST TECHNIQUE: Multidetector CT imaging of the head and cervical spine was performed following the standard protocol without intravenous contrast. Multiplanar CT image reconstructions of the cervical spine were also generated. COMPARISON:  CT scan of August 04, 2018. FINDINGS: CT HEAD FINDINGS Brain: Minimal chronic ischemic white matter disease is noted. No mass effect or midline shift is noted. Ventricular size is within normal limits. There is no evidence of mass lesion, hemorrhage or acute infarction. Vascular: No hyperdense vessel or unexpected calcification. Skull: Normal. Negative for fracture or focal lesion. Sinuses/Orbits: No acute finding. Other: Small posterior scalp hematoma is noted. CT CERVICAL SPINE FINDINGS Alignment: Minimal grade 1 anterolisthesis of C4-5 is noted secondary to posterior facet joint hypertrophy. Skull base and vertebrae: No acute fracture. No primary bone lesion or focal pathologic process. Soft tissues and spinal canal: No prevertebral fluid or swelling. No visible canal hematoma. Disc levels: Severe degenerative disc disease is noted at C5-6 and C6-7 with anterior posterior osteophyte  formation. Upper chest: Negative. Other: Degenerative changes are seen involving posterior facet joints bilaterally. IMPRESSION: Small posterior scalp hematoma. No acute intracranial abnormality seen. Severe multilevel degenerative disc disease. No acute abnormality seen in the cervical spine. Electronically Signed   By: Marijo Conception M.D.   On: 02/01/2019 14:54   Dg Shoulder Left  Result Date: 02/01/2019 CLINICAL DATA:  Post fall earlier today, now with left shoulder pain. EXAM: LEFT SHOULDER - 2+ VIEW COMPARISON:  None. FINDINGS: No fracture or dislocation. Mild degenerative change of the right glenohumeral and acromioclavicular joints with joint space loss, subchondral sclerosis and osteophytosis. No evidence of calcific tendinitis. Limited visualization of the adjacent thorax is normal. Regional soft tissues appear normal. IMPRESSION: 1. No acute findings. 2. Mild degenerative change of the left AC and glenohumeral joints. Electronically Signed   By: Sandi Mariscal M.D.   On: 02/01/2019 14:31    Procedures Procedures (including critical care time)  Medications Ordered in ED Medications  acetaminophen (TYLENOL) tablet 650 mg (650 mg Oral Given 02/01/19 1520)  Initial Impression / Assessment and Plan / ED Course  I have reviewed the triage vital signs and the nursing notes.  Pertinent labs & imaging results that were available during my care of the patient were reviewed by me and considered in my medical decision making (see chart for details).  73 yo blind female on eliquis presents after mechanical fall.  She is overall well-appearing, no acute distress.  Neuro exam is without deficit. She has small hematoma to posterior scalp, otherwise head is non tender to palpation. No open wound or deformity. CT head and neck are negative for signs of bleeding or fracture.  X-ray of left shoulder viewed by me is negative for fracture or dislocation. Pt is able to move all extremities without  difficulty. Ambulates with steady gait.  The patient appears reasonably screened and/or stabilized for discharge and I doubt any other medical condition or other [facility] requiring further screening, evaluation, or treatment in the ED at this time prior to discharge. The patient is safe for discharge with strict return precautions discussed. Recommend pcp follow up.The patient was discussed with and seen by Dr. Reather Converse who agrees with the treatment plan.  This note was prepared using Dragon voice recognition software and may include unintentional dictation errors due to the inherent limitations of voice recognition software.    Final Clinical Impressions(s) / ED Diagnoses   Final diagnoses:  Fall, initial encounter  Hematoma of scalp, initial encounter    ED Discharge Orders    None       Flint Melter 02/01/19 2139    Elnora Morrison, MD 02/03/19 1714

## 2019-02-01 NOTE — ED Triage Notes (Signed)
Pt arrives from home. Pt had a trip and fall. Pt reports being on blood thinner. Denies LOC, dizziness at this time. Pt is alert and oriented X4. Endorses shoulder pain. Pt is blind.

## 2019-02-05 ENCOUNTER — Inpatient Hospital Stay (HOSPITAL_COMMUNITY): Payer: Medicare Other

## 2019-02-05 ENCOUNTER — Telehealth: Payer: Self-pay | Admitting: Internal Medicine

## 2019-02-05 ENCOUNTER — Observation Stay (HOSPITAL_COMMUNITY)
Admission: EM | Admit: 2019-02-05 | Discharge: 2019-02-06 | Disposition: A | Discharge status: Home / Self Care | Payer: Medicare Other | Attending: Family Medicine | Admitting: Family Medicine

## 2019-02-05 ENCOUNTER — Emergency Department (HOSPITAL_COMMUNITY): Payer: Medicare Other

## 2019-02-05 ENCOUNTER — Other Ambulatory Visit: Payer: Self-pay

## 2019-02-05 DIAGNOSIS — J449 Chronic obstructive pulmonary disease, unspecified: Secondary | ICD-10-CM | POA: Diagnosis not present

## 2019-02-05 DIAGNOSIS — I739 Peripheral vascular disease, unspecified: Secondary | ICD-10-CM | POA: Insufficient documentation

## 2019-02-05 DIAGNOSIS — H548 Legal blindness, as defined in USA: Secondary | ICD-10-CM | POA: Diagnosis not present

## 2019-02-05 DIAGNOSIS — J189 Pneumonia, unspecified organism: Secondary | ICD-10-CM | POA: Diagnosis not present

## 2019-02-05 DIAGNOSIS — Z20828 Contact with and (suspected) exposure to other viral communicable diseases: Secondary | ICD-10-CM | POA: Diagnosis not present

## 2019-02-05 DIAGNOSIS — Z8249 Family history of ischemic heart disease and other diseases of the circulatory system: Secondary | ICD-10-CM | POA: Insufficient documentation

## 2019-02-05 DIAGNOSIS — J4489 Other specified chronic obstructive pulmonary disease: Secondary | ICD-10-CM | POA: Diagnosis present

## 2019-02-05 DIAGNOSIS — E876 Hypokalemia: Secondary | ICD-10-CM | POA: Diagnosis not present

## 2019-02-05 DIAGNOSIS — R918 Other nonspecific abnormal finding of lung field: Secondary | ICD-10-CM | POA: Diagnosis not present

## 2019-02-05 DIAGNOSIS — Z87891 Personal history of nicotine dependence: Secondary | ICD-10-CM | POA: Insufficient documentation

## 2019-02-05 DIAGNOSIS — I341 Nonrheumatic mitral (valve) prolapse: Secondary | ICD-10-CM | POA: Diagnosis not present

## 2019-02-05 DIAGNOSIS — E162 Hypoglycemia, unspecified: Secondary | ICD-10-CM | POA: Diagnosis not present

## 2019-02-05 DIAGNOSIS — I10 Essential (primary) hypertension: Secondary | ICD-10-CM | POA: Diagnosis present

## 2019-02-05 DIAGNOSIS — E039 Hypothyroidism, unspecified: Secondary | ICD-10-CM | POA: Insufficient documentation

## 2019-02-05 DIAGNOSIS — Z7951 Long term (current) use of inhaled steroids: Secondary | ICD-10-CM | POA: Insufficient documentation

## 2019-02-05 DIAGNOSIS — E785 Hyperlipidemia, unspecified: Secondary | ICD-10-CM | POA: Insufficient documentation

## 2019-02-05 DIAGNOSIS — Z79899 Other long term (current) drug therapy: Secondary | ICD-10-CM | POA: Insufficient documentation

## 2019-02-05 DIAGNOSIS — Z79890 Hormone replacement therapy (postmenopausal): Secondary | ICD-10-CM | POA: Diagnosis not present

## 2019-02-05 DIAGNOSIS — R42 Dizziness and giddiness: Secondary | ICD-10-CM | POA: Insufficient documentation

## 2019-02-05 DIAGNOSIS — F419 Anxiety disorder, unspecified: Secondary | ICD-10-CM | POA: Diagnosis present

## 2019-02-05 DIAGNOSIS — I480 Paroxysmal atrial fibrillation: Secondary | ICD-10-CM | POA: Diagnosis present

## 2019-02-05 DIAGNOSIS — Z7901 Long term (current) use of anticoagulants: Secondary | ICD-10-CM | POA: Insufficient documentation

## 2019-02-05 DIAGNOSIS — Z7952 Long term (current) use of systemic steroids: Secondary | ICD-10-CM | POA: Insufficient documentation

## 2019-02-05 DIAGNOSIS — R5383 Other fatigue: Secondary | ICD-10-CM

## 2019-02-05 DIAGNOSIS — M316 Other giant cell arteritis: Secondary | ICD-10-CM | POA: Insufficient documentation

## 2019-02-05 DIAGNOSIS — T68XXXA Hypothermia, initial encounter: Secondary | ICD-10-CM

## 2019-02-05 DIAGNOSIS — G8929 Other chronic pain: Secondary | ICD-10-CM | POA: Diagnosis not present

## 2019-02-05 DIAGNOSIS — K589 Irritable bowel syndrome without diarrhea: Secondary | ICD-10-CM | POA: Insufficient documentation

## 2019-02-05 DIAGNOSIS — R262 Difficulty in walking, not elsewhere classified: Secondary | ICD-10-CM | POA: Insufficient documentation

## 2019-02-05 DIAGNOSIS — R52 Pain, unspecified: Secondary | ICD-10-CM | POA: Diagnosis not present

## 2019-02-05 DIAGNOSIS — Z836 Family history of other diseases of the respiratory system: Secondary | ICD-10-CM | POA: Insufficient documentation

## 2019-02-05 DIAGNOSIS — X58XXXA Exposure to other specified factors, initial encounter: Secondary | ICD-10-CM | POA: Diagnosis not present

## 2019-02-05 DIAGNOSIS — E161 Other hypoglycemia: Secondary | ICD-10-CM | POA: Diagnosis not present

## 2019-02-05 DIAGNOSIS — R29810 Facial weakness: Secondary | ICD-10-CM | POA: Diagnosis not present

## 2019-02-05 LAB — LACTATE DEHYDROGENASE: LDH: 241 U/L — ABNORMAL HIGH (ref 98–192)

## 2019-02-05 LAB — PROTIME-INR
INR: 1 (ref 0.8–1.2)
Prothrombin Time: 12.9 seconds (ref 11.4–15.2)

## 2019-02-05 LAB — CBC WITH DIFFERENTIAL/PLATELET
Abs Immature Granulocytes: 0 10*3/uL (ref 0.00–0.07)
Band Neutrophils: 2 %
Basophils Absolute: 0 10*3/uL (ref 0.0–0.1)
Basophils Relative: 0 %
Eosinophils Absolute: 0 10*3/uL (ref 0.0–0.5)
Eosinophils Relative: 0 %
HCT: 41.7 % (ref 36.0–46.0)
Hemoglobin: 13.1 g/dL (ref 12.0–15.0)
Lymphocytes Relative: 10 %
Lymphs Abs: 0.8 10*3/uL (ref 0.7–4.0)
MCH: 35.1 pg — ABNORMAL HIGH (ref 26.0–34.0)
MCHC: 31.4 g/dL (ref 30.0–36.0)
MCV: 111.8 fL — ABNORMAL HIGH (ref 80.0–100.0)
Monocytes Absolute: 0.7 10*3/uL (ref 0.1–1.0)
Monocytes Relative: 8 %
Neutro Abs: 6.8 10*3/uL (ref 1.7–7.7)
Neutrophils Relative %: 80 %
Platelets: 286 10*3/uL (ref 150–400)
RBC: 3.73 MIL/uL — ABNORMAL LOW (ref 3.87–5.11)
RDW: 15.6 % — ABNORMAL HIGH (ref 11.5–15.5)
WBC: 8.3 10*3/uL (ref 4.0–10.5)
nRBC: 0 % (ref 0.0–0.2)
nRBC: 0 /100 WBC

## 2019-02-05 LAB — TROPONIN I (HIGH SENSITIVITY)
Troponin I (High Sensitivity): 17 ng/L (ref <18)
Troponin I (High Sensitivity): 20 ng/L — ABNORMAL HIGH (ref <18)

## 2019-02-05 LAB — CBG MONITORING, ED: Glucose-Capillary: 149 mg/dL — ABNORMAL HIGH (ref 70–99)

## 2019-02-05 LAB — COMPREHENSIVE METABOLIC PANEL
ALT: 34 U/L (ref 0–44)
AST: 71 U/L — ABNORMAL HIGH (ref 15–41)
Albumin: 3.8 g/dL (ref 3.5–5.0)
Alkaline Phosphatase: 115 U/L (ref 38–126)
Anion gap: 20 — ABNORMAL HIGH (ref 5–15)
BUN: 10 mg/dL (ref 8–23)
CO2: 19 mmol/L — ABNORMAL LOW (ref 22–32)
Calcium: 9 mg/dL (ref 8.9–10.3)
Chloride: 97 mmol/L — ABNORMAL LOW (ref 98–111)
Creatinine, Ser: 0.6 mg/dL (ref 0.44–1.00)
GFR calc Af Amer: >60 mL/min (ref >60)
GFR calc non Af Amer: >60 mL/min (ref >60)
Glucose, Bld: 159 mg/dL — ABNORMAL HIGH (ref 70–99)
Potassium: 3.2 mmol/L — ABNORMAL LOW (ref 3.5–5.1)
Sodium: 136 mmol/L (ref 135–145)
Total Bilirubin: 1.3 mg/dL — ABNORMAL HIGH (ref 0.3–1.2)
Total Protein: 6.5 g/dL (ref 6.5–8.1)

## 2019-02-05 LAB — URINALYSIS, ROUTINE W REFLEX MICROSCOPIC
Bilirubin Urine: NEGATIVE
Glucose, UA: 50 mg/dL — AB
Hgb urine dipstick: NEGATIVE
Ketones, ur: 80 mg/dL — AB
Leukocytes,Ua: NEGATIVE
Nitrite: NEGATIVE
Protein, ur: NEGATIVE mg/dL
Specific Gravity, Urine: 1.019 (ref 1.005–1.030)
pH: 5 (ref 5.0–8.0)

## 2019-02-05 LAB — FERRITIN: Ferritin: 62 ng/mL (ref 11–307)

## 2019-02-05 LAB — D-DIMER, QUANTITATIVE: D-Dimer, Quant: 0.87 ug/mL-FEU — ABNORMAL HIGH (ref 0.00–0.50)

## 2019-02-05 LAB — C-REACTIVE PROTEIN: CRP: 1.1 mg/dL — ABNORMAL HIGH (ref <1.0)

## 2019-02-05 LAB — FIBRINOGEN: Fibrinogen: 351 mg/dL (ref 210–475)

## 2019-02-05 LAB — APTT: aPTT: 33 seconds (ref 24–36)

## 2019-02-05 LAB — LACTIC ACID, PLASMA: Lactic Acid, Venous: 1.6 mmol/L (ref 0.5–1.9)

## 2019-02-05 LAB — TRIGLYCERIDES: Triglycerides: 128 mg/dL (ref <150)

## 2019-02-05 LAB — SARS CORONAVIRUS 2 (TAT 6-24 HRS): SARS Coronavirus 2: NEGATIVE

## 2019-02-05 LAB — PROCALCITONIN: Procalcitonin: <0.1 ng/mL

## 2019-02-05 MED ORDER — TRIAMCINOLONE ACETONIDE 0.1 % EX CREA
1.0000 "application " | TOPICAL_CREAM | Freq: Two times a day (BID) | CUTANEOUS | Status: DC
  Filled 2019-02-05: qty 15

## 2019-02-05 MED ORDER — ACETAMINOPHEN 650 MG RE SUPP: 650.0000 mg | Freq: Four times a day (QID) | RECTAL | Status: DC | PRN

## 2019-02-05 MED ORDER — POTASSIUM CHLORIDE CRYS ER 20 MEQ PO TBCR
40.0000 meq | EXTENDED_RELEASE_TABLET | Freq: Once | ORAL | Status: AC
  Administered 2019-02-05: 40 meq via ORAL
  Filled 2019-02-05: qty 2

## 2019-02-05 MED ORDER — MONTELUKAST SODIUM 10 MG PO TABS
10.0000 mg | ORAL_TABLET | Freq: Every day | ORAL | Status: DC
  Administered 2019-02-05: 10 mg via ORAL
  Filled 2019-02-05: qty 1

## 2019-02-05 MED ORDER — MOMETASONE FURO-FORMOTEROL FUM 200-5 MCG/ACT IN AERO
2.0000 | INHALATION_SPRAY | Freq: Two times a day (BID) | RESPIRATORY_TRACT | Status: DC
  Filled 2019-02-05: qty 8.8

## 2019-02-05 MED ORDER — ALBUTEROL SULFATE (2.5 MG/3ML) 0.083% IN NEBU: 2.5000 mg | INHALATION_SOLUTION | RESPIRATORY_TRACT | Status: DC | PRN

## 2019-02-05 MED ORDER — AMIODARONE HCL 200 MG PO TABS
200.0000 mg | ORAL_TABLET | Freq: Every day | ORAL | Status: DC
  Administered 2019-02-06: 200 mg via ORAL
  Filled 2019-02-05: qty 1

## 2019-02-05 MED ORDER — APIXABAN 5 MG PO TABS
5.0000 mg | ORAL_TABLET | Freq: Two times a day (BID) | ORAL | Status: DC
  Administered 2019-02-05 – 2019-02-06 (×2): 5 mg via ORAL
  Filled 2019-02-05 (×2): qty 1

## 2019-02-05 MED ORDER — SODIUM CHLORIDE 0.9 % IV SOLN
INTRAVENOUS | Status: DC
  Administered 2019-02-06: via INTRAVENOUS

## 2019-02-05 MED ORDER — ACETAMINOPHEN 325 MG PO TABS: 650.0000 mg | ORAL_TABLET | Freq: Four times a day (QID) | ORAL | Status: DC | PRN

## 2019-02-05 MED ORDER — METHOCARBAMOL 500 MG PO TABS
500.0000 mg | ORAL_TABLET | Freq: Four times a day (QID) | ORAL | Status: DC | PRN
  Administered 2019-02-06: 500 mg via ORAL
  Filled 2019-02-05: qty 1

## 2019-02-05 MED ORDER — ACETAMINOPHEN 325 MG PO TABS
650.0000 mg | ORAL_TABLET | Freq: Once | ORAL | Status: AC
  Administered 2019-02-05: 650 mg via ORAL
  Filled 2019-02-05: qty 2

## 2019-02-05 MED ORDER — POTASSIUM CHLORIDE CRYS ER 20 MEQ PO TBCR
40.0000 meq | EXTENDED_RELEASE_TABLET | Freq: Every day | ORAL | Status: DC
  Administered 2019-02-06: 40 meq via ORAL
  Filled 2019-02-05: qty 2

## 2019-02-05 MED ORDER — LEVOTHYROXINE SODIUM 50 MCG PO TABS
50.0000 ug | ORAL_TABLET | Freq: Every day | ORAL | Status: DC
  Administered 2019-02-06: 50 ug via ORAL
  Filled 2019-02-05: qty 1

## 2019-02-05 MED ORDER — LORAZEPAM 1 MG PO TABS
1.0000 mg | ORAL_TABLET | Freq: Every day | ORAL | Status: DC
  Administered 2019-02-05: 1 mg via ORAL
  Filled 2019-02-05: qty 1

## 2019-02-05 MED ORDER — TRAMADOL HCL 50 MG PO TABS
50.0000 mg | ORAL_TABLET | Freq: Four times a day (QID) | ORAL | Status: DC | PRN
  Administered 2019-02-05: 50 mg via ORAL
  Filled 2019-02-05: qty 1

## 2019-02-05 MED ORDER — DILTIAZEM HCL ER COATED BEADS 120 MG PO CP24
120.0000 mg | ORAL_CAPSULE | Freq: Every day | ORAL | Status: DC
  Administered 2019-02-06: 120 mg via ORAL
  Filled 2019-02-05: qty 1

## 2019-02-05 MED ORDER — FUROSEMIDE 40 MG PO TABS
40.0000 mg | ORAL_TABLET | Freq: Every day | ORAL | Status: DC
  Filled 2019-02-05: qty 1

## 2019-02-05 MED ORDER — GABAPENTIN 300 MG PO CAPS
900.0000 mg | ORAL_CAPSULE | Freq: Every day | ORAL | Status: DC
  Administered 2019-02-05: 900 mg via ORAL
  Filled 2019-02-05: qty 3

## 2019-02-05 MED ORDER — PREDNISONE 5 MG PO TABS
5.0000 mg | ORAL_TABLET | Freq: Every day | ORAL | Status: DC
  Administered 2019-02-06: 5 mg via ORAL
  Filled 2019-02-05 (×2): qty 1

## 2019-02-05 MED ORDER — THEOPHYLLINE ER 400 MG PO TB24
200.0000 mg | ORAL_TABLET | Freq: Two times a day (BID) | ORAL | Status: DC
  Administered 2019-02-05 – 2019-02-06 (×2): 200 mg via ORAL
  Filled 2019-02-05 (×3): qty 0.5

## 2019-02-05 MED ORDER — SODIUM CHLORIDE 0.9 % IV SOLN
1.0000 g | Freq: Once | INTRAVENOUS | Status: AC
  Administered 2019-02-05: 1 g via INTRAVENOUS
  Filled 2019-02-05: qty 10

## 2019-02-05 MED ORDER — SODIUM CHLORIDE 0.9 % IV SOLN
500.0000 mg | Freq: Once | INTRAVENOUS | Status: AC
  Administered 2019-02-05: 500 mg via INTRAVENOUS
  Filled 2019-02-05: qty 500

## 2019-02-05 NOTE — ED Notes (Signed)
ED TO INPATIENT HANDOFF REPORT  ED Nurse Name and Phone #: [phone] [place] Name/Age/Gender Crystal [name] 73 y.o. female Room/Bed: 045C/045C  Code Status   Code Status: Prior  Home/SNF/Other Home Patient oriented to: self, place, time and situation Is this baseline? Yes   Triage Complete: Triage complete  Chief Complaint dizzy  Triage Note Pt brought in by ems for c/o dizziness and weakness ; pt states that she tried to get out of bed this morning and was unable to do so ; pt states she is normally able to walk with a walker or a cane ; pt denies any chest pain or sob ; pt c/o of left shoulder pain from a fall 4 days ago ; upon ems arrival , patients CBG  Was 60 and ems administered  d10 iv fluids and cbg went up to 224 ; cbg currently 149; pt alert and oriented x 4 and following simple commands    Allergies Allergies  Allergen Reactions  . Simvastatin     myalgias    Level of Care/Admitting Diagnosis ED Disposition    ED Disposition Condition [facility] Area: [place] [[id number]]  Level of Care: Telemetry Medical [104]  I expect the patient will be discharged within 24 hours: Yes  LOW acuity---Tx typically complete <24 hrs---ACUTE conditions typically can be evaluated <24 hours---LABS likely to return to acceptable levels <24 hours---IS near functional baseline---EXPECTED to return to current living arrangement---NOT newly hypoxic: Does not meet criteria for 5C-Observation unit  Covid Evaluation: Confirmed COVID Negative  Diagnosis: Hypothermia [id number]  Admitting Physician: Vashti Hey [id number]  Attending Physician: Vashti Hey [id number]  PT Class (Do Not Modify): Observation [104]  PT Acc Code (Do Not Modify): Observation [10022]       B Medical/Surgery History Past Medical History:  Diagnosis Date  . Adenomatous colon polyp 2007 & 2010    Dr [name]  . Allergy    seasonal  . Arthritis   .  Cataract   . Diverticulosis   . 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 Hyperlipidemia   . Hypertension   . IBS (irritable bowel syndrome)   . Legally blind   . MVP (mitral valve prolapse)   . Paroxysmal atrial fibrillation with rapid ventricular response ([employer]) 08/29/2017  . Peripheral vascular disease ([place])   . RAD (reactive airway disease)    Past Surgical History:  Procedure Laterality Date  . ABDOMINAL HYSTERECTOMY     with USO for dysfunctionall menses  . APPENDECTOMY    . ARTERY BIOPSY Bilateral 07/21/2017   Procedure: BIOPSY BILATERAL TEMPORAL ARTERY;  Surgeon: [name], MD;  Location: [facility];  Service: Vascular;  Laterality: Bilateral;  . CARDIOVERSION N/A 06/16/2018   Procedure: CARDIOVERSION;  Surgeon: [name], MD;  Location: [place];  Service: Cardiovascular;  Laterality: N/A;  . CATARACT EXTRACTION W/ INTRAOCULAR LENS IMPLANT  2013   bilateral; Dr [name]  . COLONOSCOPY  2013   negative , due 2018; Dr [name]  . COLONOSCOPY W/ POLYPECTOMY      X2 ; diverticulosis  . EMBOLECTOMY Right 08/29/2017   Procedure: Right Groin Exploration  ;  Surgeon: Rosetta Posner, MD;  Location: [place];  Service: Vascular;  Laterality: Right;  . HIP PINNING,CANNULATED Right 03/21/2018  . HIP PINNING,CANNULATED Right 03/21/2018   Procedure: CANNULATED HIP PINNING;  Surgeon: Rod Can, MD;  Location: [place];  Service: Orthopedics;  Laterality: Right;  .  LUMBAR LAMINECTOMY  2000   Dr Vertell Limber  . SHOULDER SURGERY     R shoulder  . TONSILLECTOMY       A IV Location/Drains/Wounds Patient Lines/Drains/Airways Status   Active Line/Drains/Airways    Name:   Placement date:   Placement time:   Site:   Days:   Peripheral IV 02/05/19 Right Forearm   02/05/19    -    Forearm   less than 1   Peripheral IV 02/05/19 Left Antecubital   02/05/19    1035    Antecubital   less than 1   Incision (Closed) 07/21/17 Face Left   07/21/17    0903     564   Incision (Closed)  07/21/17 Face Right   07/21/17    0903     564   Incision (Closed) 03/21/18 Hip Right   03/21/18    1412     321          Intake/Output Last 24 hours  Intake/Output Summary (Last 24 hours) at 02/05/2019 1947 Last data filed at 02/05/2019 1625 Gross per 24 hour  Intake 350 ml  Output -  Net 350 ml    Labs/Imaging Results for orders placed or performed during the hospital encounter of 02/05/19 (from the past 48 hour(s))  CBG monitoring, ED     Status: Abnormal   Collection Time: 02/05/19 10:21 AM  Result Value Ref Range   Glucose-Capillary 149 (H) 70 - 99 mg/dL  Comprehensive metabolic panel     Status: Abnormal   Collection Time: 02/05/19 10:32 AM  Result Value Ref Range   Sodium 136 135 - 145 mmol/L   Potassium 3.2 (L) 3.5 - 5.1 mmol/L   Chloride 97 (L) 98 - 111 mmol/L   CO2 19 (L) 22 - 32 mmol/L   Glucose, Bld 159 (H) 70 - 99 mg/dL   BUN 10 8 - 23 mg/dL   Creatinine, Ser 0.60 0.44 - 1.00 mg/dL   Calcium 9.0 8.9 - 10.3 mg/dL   Total Protein 6.5 6.5 - 8.1 g/dL   Albumin 3.8 3.5 - 5.0 g/dL   AST 71 (H) 15 - 41 U/L   ALT 34 0 - 44 U/L   Alkaline Phosphatase 115 38 - 126 U/L   Total Bilirubin 1.3 (H) 0.3 - 1.2 mg/dL   GFR calc non Af Amer >60 >60 mL/min   GFR calc Af Amer >60 >60 mL/min   Anion gap 20 (H) 5 - 15    Comment: Performed at [facility], [address] [place], [place] [address]  CBC WITH DIFFERENTIAL     Status: Abnormal   Collection Time: 02/05/19 10:32 AM  Result Value Ref Range   WBC 8.3 4.0 - 10.5 K/uL   RBC 3.73 (L) 3.87 - 5.11 MIL/uL   Hemoglobin 13.1 12.0 - 15.0 g/dL   HCT 41.7 36.0 - 46.0 %   MCV 111.8 (H) 80.0 - 100.0 fL   MCH 35.1 (H) 26.0 - 34.0 pg   MCHC 31.4 30.0 - 36.0 g/dL   RDW 15.6 (H) 11.5 - 15.5 %   Platelets 286 150 - 400 K/uL   nRBC 0.0 0.0 - 0.2 %   Neutrophils Relative % 80 %   Neutro Abs 6.8 1.7 - 7.7 K/uL   Band Neutrophils 2 %   Lymphocytes Relative 10 %   Lymphs Abs 0.8 0.7 - 4.0 K/uL   Monocytes Relative 8 %    Monocytes Absolute 0.7 0.1 -  1.0 K/uL   Eosinophils Relative 0 %   Eosinophils Absolute 0.0 0.0 - 0.5 K/uL   Basophils Relative 0 %   Basophils Absolute 0.0 0.0 - 0.1 K/uL   nRBC 0 0 /100 WBC   Abs Immature Granulocytes 0.00 0.00 - 0.07 K/uL   Polychromasia PRESENT     Comment: Performed at [facility] [address] [place], [place] [address]  APTT     Status: None   Collection Time: 02/05/19 10:32 AM  Result Value Ref Range   aPTT 33 24 - 36 seconds    Comment: Performed at [facility] [address] [place], [place] [address]  Protime-INR     Status: None   Collection Time: 02/05/19 10:32 AM  Result Value Ref Range   Prothrombin Time 12.9 11.4 - 15.2 seconds   INR 1.0 0.8 - 1.2    Comment: (NOTE) INR goal varies based on device and disease states. Performed at [facility], [place] [address] [place], [place] [address]   Troponin I (High Sensitivity)     Status: None   Collection Time: 02/05/19 10:32 AM  Result Value Ref Range   Troponin I (High Sensitivity) 17 <18 ng/L    Comment: (NOTE) Elevated high sensitivity troponin I (hsTnI) values and significant  changes across serial measurements may suggest ACS but many other  chronic and acute conditions are known to elevate hsTnI results.  Refer to the "Links" section for chest pain algorithms and additional  guidance. Performed at [facility], [place] [address] [place], [place] [address]   Lactic acid, plasma     Status: None   Collection Time: 02/05/19 11:02 AM  Result Value Ref Range   Lactic Acid, Venous 1.6 0.5 - 1.9 mmol/L    Comment: Performed at [facility] [address], [place], [place] [address]  SARS CORONAVIRUS 2 (TAT 6-12 HRS) Nasal Swab Aptima Multi Swab     Status: None   Collection Time: 02/05/19 12:33 PM   Specimen: Aptima Multi Swab; Nasal Swab  Result Value Ref Range   SARS Coronavirus 2 NEGATIVE NEGATIVE    Comment: (NOTE) SARS-CoV-2 target nucleic acids are NOT  DETECTED. The SARS-CoV-2 RNA is generally detectable in upper and lower respiratory specimens during the acute phase of infection. Negative results do not preclude SARS-CoV-2 infection, do not rule out co-infections with other pathogens, and should not be used as the sole basis for treatment or other patient management decisions. Negative results must be combined with clinical observations, patient history, and epidemiological information. The expected result is Negative. Fact Sheet for Patients: SugarRoll.be Fact Sheet for Healthcare Providers: https://www.woods-mathews.com/ This test is not yet approved or cleared by the [place] FDA and  has been authorized for detection and/or diagnosis of SARS-CoV-2 by FDA under an Emergency Use Authorization (EUA). This EUA will remain  in effect (meaning this test can be used) for the duration of the COVID-19 declaration under Section 56 4(b)(1) of the Act, 21 U.S.C. section 360bbb-3(b)(1), unless the authorization is terminated or revoked sooner. Performed at [facility], [place] [address], [place], [place] [address]   Troponin I (High Sensitivity)     Status: Abnormal   Collection Time: 02/05/19  1:55 PM  Result Value Ref Range   Troponin I (High Sensitivity) 20 (H) <18 ng/L    Comment: (NOTE) Elevated high sensitivity troponin I (hsTnI) values and significant  changes across serial measurements may  suggest ACS but many other  chronic and acute conditions are known to elevate hsTnI results.  Refer to the "Links" section for chest pain algorithms and additional  guidance. Performed at [facility], [place] [address] [place], [place] [address]   Urinalysis, Routine w reflex microscopic     Status: Abnormal   Collection Time: 02/05/19  2:08 PM  Result Value Ref Range   Color, Urine YELLOW YELLOW   APPearance CLEAR CLEAR   Specific Gravity, Urine 1.019 1.005 - 1.030   pH 5.0 5.0 -  8.0   Glucose, UA 50 (A) NEGATIVE mg/dL   Hgb urine dipstick NEGATIVE NEGATIVE   Bilirubin Urine NEGATIVE NEGATIVE   Ketones, ur 80 (A) NEGATIVE mg/dL   Protein, ur NEGATIVE NEGATIVE mg/dL   Nitrite NEGATIVE NEGATIVE   Leukocytes,Ua NEGATIVE NEGATIVE    Comment: Performed at [facility] [address] [place], [place] [address]  D-dimer, quantitative     Status: Abnormal   Collection Time: 02/05/19  4:00 PM  Result Value Ref Range   D-Dimer, Quant 0.87 (H) 0.00 - 0.50 ug/mL-FEU    Comment: (NOTE) At the manufacturer cut-off of 0.50 ug/mL FEU, this assay has been documented to exclude PE with a sensitivity and negative predictive value of 97 to 99%.  At this time, this assay has not been approved by the FDA to exclude DVT/VTE. Results should be correlated with clinical presentation. Performed at [facility], [place] [address], [place], [place] [address]   Procalcitonin     Status: None   Collection Time: 02/05/19  4:00 PM  Result Value Ref Range   Procalcitonin <0.10 ng/mL    Comment:        Interpretation: PCT (Procalcitonin) <= 0.5 ng/mL: Systemic infection (sepsis) is not likely. Local bacterial infection is possible. (NOTE)       Sepsis PCT Algorithm           Lower Respiratory Tract                                      Infection PCT Algorithm    ----------------------------     ----------------------------         PCT < 0.25 ng/mL                PCT < 0.10 ng/mL         Strongly encourage             Strongly discourage   discontinuation of antibiotics    initiation of antibiotics    ----------------------------     -----------------------------       PCT 0.25 - 0.50 ng/mL            PCT 0.10 - 0.25 ng/mL               OR       >80% decrease in PCT            Discourage initiation of                                            antibiotics      Encourage discontinuation           of antibiotics    ----------------------------      -----------------------------  PCT >= 0.50 ng/mL              PCT 0.26 - 0.50 ng/mL               AND        <80% decrease in PCT             Encourage initiation of                                             antibiotics       Encourage continuation           of antibiotics    ----------------------------     -----------------------------        PCT >= 0.50 ng/mL                  PCT > 0.50 ng/mL               AND         increase in PCT                  Strongly encourage                                      initiation of antibiotics    Strongly encourage escalation           of antibiotics                                     -----------------------------                                           PCT <= 0.25 ng/mL                                                 OR                                        > 80% decrease in PCT                                     Discontinue / Do not initiate                                             antibiotics Performed at [facility], [address], [place], [place] [address]   Lactate dehydrogenase     Status: Abnormal   Collection Time: 02/05/19  4:00 PM  Result Value Ref Range   LDH 241 (H) 98 - 192 U/L    Comment: Performed at [facility], [place] [address] [place], [place] [address]  Fibrinogen  Status: None   Collection Time: 02/05/19  4:00 PM  Result Value Ref Range   Fibrinogen 351 210 - 475 mg/dL    Comment: Performed at [facility] [address], [place], [place] [address]  Triglycerides     Status: None   Collection Time: 02/05/19  4:00 PM  Result Value Ref Range   Triglycerides 128 <150 mg/dL    Comment: Performed at [facility] [address], [place], [place] [address]  C-reactive protein     Status: Abnormal   Collection Time: 02/05/19  4:30 PM  Result Value Ref Range   CRP 1.1 (H) <1.0 mg/dL    Comment: Performed at [facility] [address], [place], [place] [address]   Ferritin     Status: None   Collection Time: 02/05/19  4:30 PM  Result Value Ref Range   Ferritin 62 11 - 307 ng/mL    Comment: Performed at [facility] [address], [place], [place] [address]   Ct Chest Wo Contrast  Result Date: 02/05/2019 CLINICAL DATA:  Weakness.  Evaluate pneumonia seen on chest x-ray EXAM: CT CHEST WITHOUT CONTRAST TECHNIQUE: Multidetector CT imaging of the chest was performed following the standard protocol without IV contrast. COMPARISON:  Chest x-ray earlier today FINDINGS: Cardiovascular: Heart is normal size. Aorta is normal caliber. Scattered aortic atherosclerosis. Mediastinum/Nodes: No mediastinal, hilar, or axillary adenopathy. Trachea and esophagus are unremarkable. Thyroid unremarkable. Lungs/Pleura: Peripheral interstitial thickening and tree-in-bud nodular densities in both upper lobes and lower lobes, suspect chronic interstitial lung disease. Right middle lobe nodule measures 7 mm. Bibasilar scarring or atelectasis. No effusions. Upper Abdomen: Fatty infiltration of the liver. Musculoskeletal: Chest wall soft tissues are unremarkable. No acute bony abnormality. IMPRESSION: Peripheral interstitial opacities and tree-in-bud nodular densities in the lungs bilaterally, most pronounced in the lower lobes and bases as seen on chest x-ray today. Favor chronic interstitial lung disease. No other confluent opacities to suggest pneumonia. 7 mm right middle lobe nodule. Non-contrast chest CT at 6-12 months is recommended. If the nodule is stable at time of repeat CT, then future CT at 18-24 months (from today's scan) is considered optional for low-risk patients, but is recommended for high-risk patients. This recommendation follows the consensus statement: Guidelines for Management of Incidental Pulmonary Nodules Detected on CT Images: From the Fleischner Society 2017; Radiology 2017; 284:228-243. Fatty infiltration of the liver Aortic Atherosclerosis (ICD10-I70.0).  Electronically Signed   By: Rolm Baptise M.D.   On: 02/05/2019 18:28   Dg Chest Port 1 View  Result Date: 02/05/2019 CLINICAL DATA:  Generalized weakness and dizziness EXAM: PORTABLE CHEST 1 VIEW COMPARISON:  January 31, 2019 FINDINGS: There is subtle ill-defined opacity in each lung base, concerning for bibasilar pneumonia. Lungs elsewhere clear. Heart size and pulmonary vascularity are normal. No adenopathy. There is aortic atherosclerosis. No bone lesions. IMPRESSION: Subtle ill-defined opacity in each lung base, likely early pneumonia in each lung base. Lungs elsewhere clear. Stable cardiac silhouette. Aortic Atherosclerosis (ICD10-I70.0). Electronically Signed   By: Lowella Grip III M.D.   On: 02/05/2019 11:18    Pending Labs Unresulted Labs (From admission, onward)    Start     Ordered   02/05/19 1751  TSH  Once,   STAT     02/05/19 1750   02/05/19 1047  Blood Culture (routine x 2)  BLOOD CULTURE X 2,   STAT     02/05/19 1047   02/05/19 1047  Urine  culture  ONCE - STAT,   STAT     02/05/19 1047   Signed and Held  Basic metabolic panel  Tomorrow morning,   R     Signed and Held   Signed and Held  CBC  Tomorrow morning,   R     Signed and Held          Vitals/Pain Today's Vitals   02/05/19 1730 02/05/19 1815 02/05/19 1830 02/05/19 1845  BP: (!) 126/55 (!) 129/56 (!) 124/56 (!) 126/58  Pulse: 66 72 71 72  Resp: 15 17 15 19   Temp:      TempSrc:      SpO2: 98% 96% 95% 100%  Weight:      PainSc:        Isolation Precautions No active isolations  Medications Medications  cefTRIAXone (ROCEPHIN) 1 g in sodium chloride 0.9 % 100 mL IVPB (0 g Intravenous Stopped 02/05/19 1314)  azithromycin (ZITHROMAX) 500 mg in sodium chloride 0.9 % 250 mL IVPB (0 mg Intravenous Stopped 02/05/19 1625)  potassium chloride SA (K-DUR) CR tablet 40 mEq (40 mEq Oral Given 02/05/19 1943)  acetaminophen (TYLENOL) tablet 650 mg (650 mg Oral Given 02/05/19 1943)    Mobility walks with device Low  fall risk   Focused Assessments Pulmonary Assessment Handoff:  Lung sounds:   O2 Device: Room Air        R Recommendations: See Admitting Provider Note  Report given to:   Additional Notes: Pt is alert and oriented x4.  20G IV LAC and 20G IV RFA.  Husband at bedside.  Tylenol given at [facility] for headache.  Ambulates with walker at home

## 2019-02-05 NOTE — Telephone Encounter (Signed)
Tried calling husband to let him know response below. He stated he was in the emergency room with pt.

## 2019-02-05 NOTE — ED Triage Notes (Signed)
Pt brought in by ems for c/o dizziness and weakness ; pt states that she tried to get out of bed this morning and was unable to do so ; pt states she is normally able to walk with a walker or a cane ; pt denies any chest pain or sob ; pt c/o of left shoulder pain from a fall 4 days ago ; upon ems arrival , patients CBG  Was 60 and ems administered  d10 iv fluids and cbg went up to 224 ; cbg currently 149; pt alert and oriented x 4 and following simple commands

## 2019-02-05 NOTE — Telephone Encounter (Signed)
The loss of vision was not related to the elevated blood counts.  Is she having any concerning symptoms?  If not she will need repeat labs, chest xray, urine studies

## 2019-02-05 NOTE — Telephone Encounter (Signed)
Husband Juanda Crumble calling with concerns about pt's white blood count.  He believes further investigation needs to be done asap in order to address the higher than normal white blood count.  He would like a call back asap.  He states pt lost her vision last time this happened.

## 2019-02-05 NOTE — ED Notes (Signed)
Help get patient undress on the monitor did ekg shown to Dr Tyrone Nine patient is resting with family at bedside and call bell in reach

## 2019-02-05 NOTE — ED Provider Notes (Signed)
[facility] EMERGENCY DEPARTMENT Provider Note   CSN: [id number] Arrival date & time: 02/05/19  1015     History   Chief Complaint Chief Complaint  Patient presents with   Dizziness    HPI Crystal [name] is a 73 y.o. 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 Pt reports that she attempted to get out of bed but was unable to do so due to feeling so weak. She reports she felt fine yesterday upon going to bed - husband reports that pt has not been eating as much as normal for the past few days and has had a lack of energy. Pt was recently seen in the ED on 08/20 for a mechanical fall with negative head CT scan. Pt had shoulder pain at that time which she states has since improved. Pt denies fever, chills, chest pain, shortness of breath, cough, urinary sx, abdominal pain, nausea, vomiting, diarrhea, speech difficulties, confusion, unilateral weakness or numbness, or any other associated symptoms.      The history is provided by the patient and the spouse.    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   Hyperlipidemia    Hypertension    IBS (irritable bowel syndrome)    Legally blind    MVP (mitral valve prolapse)    Paroxysmal atrial fibrillation with rapid ventricular response ([place]) 08/29/2017   Peripheral vascular disease ([place])    RAD (reactive airway disease)     Patient Active Problem List   Diagnosis Date Noted   Community acquired pneumonia 02/05/2019   Hypothermia 02/05/2019   Pruritus 01/02/2019   Rib pain on left side 08/09/2018   Hypokalemia 08/09/2018     Long term (current) use of anticoagulants 08/01/2018   Pre-operative clearance 08/01/2018   Closed compression fracture of L4 vertebra ([place]) 07/11/2018   Bilateral leg edema 07/11/2018   Lung nodule < 6cm on CT 07/11/2018   Subclinical hypothyroidism 04/11/2018   Fracture of femoral neck, right ([place]) 03/22/2018   PAF (paroxysmal atrial fibrillation) ([place]) 03/18/2018   Femoral artery thrombosis, 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   Herpes zoster without complication [id number]   Jaw claudication 07/27/2017   Temporal arteritis ([place]) 07/18/2017   Vision, loss, sudden, bilateral 07/18/2017   TMJ arthralgia 06/27/2017   Osteoarthritis 06/25/2016   Anxiety 06/25/2016   Back pain 07/18/2015   Hyperuricemia 06/28/2011   Carotid bruit present 06/05/2010   Osteoporosis 06/03/2008   History of colonic polyps 06/03/2008   INTENTION TREMOR 12/06/2007   IBS 10/16/2007   Hyperlipidemia 06/06/2007   Essential hypertension 06/06/2007   Chronic obstructive airway disease with asthma ([place]) 02/23/2007    Past Surgical History:  Procedure Laterality Date   ABDOMINAL HYSTERECTOMY     with USO for dysfunctionall menses   APPENDECTOMY     ARTERY BIOPSY Bilateral 07/21/2017   Procedure: BIOPSY BILATERAL TEMPORAL ARTERY;  Surgeon: [name], MD;  Location: [facility];  Service: Vascular;  Laterality: Bilateral;   CARDIOVERSION N/A 06/16/2018   Procedure: CARDIOVERSION;  Surgeon: [name], MD;  Location: [facility] ENDOSCOPY;  Service: Cardiovascular;  Laterality: N/A;   CATARACT EXTRACTION W/ INTRAOCULAR LENS IMPLANT  2013   bilateral; Dr [name]   COLONOSCOPY  2013   negative , due 2018; Dr [name]   COLONOSCOPY W/ POLYPECTOMY      X2 ; diverticulosis   EMBOLECTOMY Right 08/29/2017   Procedure: Right Groin Exploration  ;  Surgeon: Rosetta Posner, MD;  Location: [facility];  Service: Vascular;  Laterality: Right;   HIP PINNING,CANNULATED Right 03/21/2018   HIP  PINNING,CANNULATED Right 03/21/2018   Procedure: CANNULATED HIP PINNING;  Surgeon: Rod Can, MD;  Location: [facility];  Service: Orthopedics;  Laterality: Right;   LUMBAR LAMINECTOMY  2000   Dr Vertell Limber   SHOULDER SURGERY     R shoulder   TONSILLECTOMY       OB History   No obstetric history on file.      Home Medications    Prior to Admission medications   Medication Sig Start Date End Date Taking? Authorizing Provider  albuterol (VENTOLIN HFA) 108 (90 Base) MCG/ACT inhaler INHALE 1 PUFF INTO THE LUNGS AS DIRECTED AS NEEDED 01/12/19  Yes Burns, Claudina Lick, MD  alendronate (FOSAMAX) 70 MG tablet Take 70 mg by mouth every Monday.  01/27/18  Yes [provider]  amiodarone (PACERONE) 200 MG tablet Take 1 tablet (200 mg total) by mouth daily. 08/14/18  Yes [name], MD  Calcium Citrate-Vitamin D (CALCIUM + D PO) Take 1 tablet by mouth daily.   Yes [provider]  Cholecalciferol (VITAMIN D3) 1000 units CAPS Take 1 capsule (1,000 Units total) by mouth daily with lunch. 03/30/18  Yes Angiulli, Lavon Paganini, PA-C  diltiazem (CARDIZEM CD) 120 MG 24 hr capsule TAKE ONE CAPSULE BY MOUTH DAILY 08/28/18  Yes Crenshaw, Denice Bors, MD  ELIQUIS 5 MG TABS tablet TAKE 1 TABLET BY MOUTH TWO TIMES A DAY 11/14/18  Yes [name], MD  furosemide (LASIX) 40 MG tablet TAKE ONE TABLET BY MOUTH DAILY Patient taking differently: Take 40 mg by mouth daily.  07/10/18  Yes Burns, Claudina Lick, MD  gabapentin (NEURONTIN) 100 MG capsule Take 5 capsules (500 mg total) by mouth at bedtime. Patient taking differently: Take 900 mg by mouth at bedtime.  03/30/18  Yes Angiulli, Lavon Paganini, PA-C  levothyroxine (SYNTHROID) 50 MCG tablet Take 1 tablet (50 mcg total) by mouth daily. 02/01/19  Yes Burns, Claudina Lick, MD  LORazepam (ATIVAN) 1 MG tablet TAKE ONE TABLET BY MOUTH EVERY NIGHT AT BEDTIME 01/29/19  Yes Burns, Claudina Lick, MD  methocarbamol (ROBAXIN) 500 MG tablet Take 1 tablet (500 mg total) by mouth every 6  (six) hours as needed for muscle spasms. 07/11/18  Yes Burns, Claudina Lick, MD  montelukast (SINGULAIR) 10 MG tablet Take 1 tablet (10 mg total) by mouth at bedtime. 01/03/19  Yes Burns, Claudina Lick, MD  potassium chloride SA (K-DUR,KLOR-CON) 20 MEQ tablet TAKE TWO TABLETS BY MOUTH DAILY 09/18/18  Yes Burns, Claudina Lick, MD  predniSONE (DELTASONE) 5 MG tablet Take 5 mg by mouth daily with breakfast.    Yes [provider]  SYMBICORT 160-4.5 MCG/ACT inhaler INHALE ONE TO TWO PUFFS BY MOUTH EVERY 12 HOURS, THEN GARGLE AND SPIT AFTER USE 11/16/18  Yes Burns, Claudina Lick, MD  theophylline (UNIPHYL) 400 MG 24 hr tablet TAKE 1 TABLET (400 MG TOTAL) BY MOUTH DAILY. Patient taking differently: Take 200 mg by mouth 2 (two) times daily.  07/10/18  Yes Burns, Claudina Lick, MD  traMADol (ULTRAM) 50 MG tablet  Take 1 tablet (50 mg total) by mouth every 6 (six) hours as needed for moderate pain. 08/09/18  Yes Burns, Claudina Lick, MD  triamcinolone cream (KENALOG) 0.1 % Apply 1 application topically 2 (two) times daily. 01/02/19  Yes [name], MD    Family History Family History  Problem Relation Age of Onset   Heart attack Brother 64   Parkinsonism Mother    Stroke Mother    Heart attack Father        pre 1   Diabetes Sister    COPD Sister        X 3   Other Son        suicide 2016   Colon cancer Neg Hx    Cancer Neg Hx    Esophageal cancer Neg Hx    Liver cancer Neg Hx    Pancreatic cancer Neg Hx    Rectal cancer Neg Hx    Stomach cancer Neg Hx     Social History Social History   Tobacco Use   Smoking status: Former Smoker    Packs/day: 1.00    Years: 23.00    Pack years: 23.00    Quit date: 06/14/1989    Years since quitting: 29.6   Smokeless tobacco: Never Used   Tobacco comment: smoked 1961-1991 , up to < 1 ppd  Substance Use Topics   Alcohol use: Yes    Alcohol/week: 14.0 standard drinks    Types: 14 Glasses of wine per week    Comment: Wine   Drug use: No     Allergies     Simvastatin   Review of Systems Review of Systems  Constitutional: Positive for fatigue. Negative for chills and fever.  HENT: Negative for congestion.   Eyes: Negative for pain.  Respiratory: Negative for cough and shortness of breath.   Cardiovascular: Negative for chest pain.  Gastrointestinal: Negative for abdominal pain, constipation, diarrhea, nausea and vomiting.  Genitourinary: Negative for difficulty urinating, dysuria and frequency.  Musculoskeletal: Negative for myalgias.  Skin: Negative for rash.  Neurological: Positive for dizziness. Negative for syncope, weakness and numbness.     Physical Exam Updated Vital Signs BP (!) 134/54    Pulse 81    Temp (!) 94.6 F (34.8 C) (Rectal)    Resp 18    SpO2 94%   Physical Exam Vitals signs and nursing note reviewed.  Constitutional:      Appearance: She is not ill-appearing.  HENT:     Head: Normocephalic and atraumatic.     Right Ear: Tympanic membrane normal.     Left Ear: Tympanic membrane normal.  Eyes:     Conjunctiva/sclera: Conjunctivae normal.     Comments: Pt is blind  Neck:     Musculoskeletal: Neck supple.  Cardiovascular:     Rate and Rhythm: Normal rate and regular rhythm.  Pulmonary:     Effort: Pulmonary effort is normal.     Breath sounds: Normal breath sounds.  Abdominal:     Palpations: Abdomen is soft.     Tenderness: There is no abdominal tenderness.  Skin:    General: Skin is warm and dry.  Neurological:     Mental Status: She is alert.     Comments: CN 5-12 grossly intact; legally blind; unable to assess EOMs A&O x4 GCS 15 Sensation and strength intact Unable to perform finger to nose Neg pronator drift       ED Treatments / Results  Labs (all labs ordered are  listed, but only abnormal results are displayed) Labs Reviewed  COMPREHENSIVE METABOLIC PANEL - Abnormal; Notable for the following components:      Result Value   Potassium 3.2 (*)    Chloride 97 (*)    CO2 19 (*)     Glucose, Bld 159 (*)    AST 71 (*)    Total Bilirubin 1.3 (*)    Anion gap 20 (*)    All other components within normal limits  CBC WITH DIFFERENTIAL/PLATELET - Abnormal; Notable for the following components:   RBC 3.73 (*)    MCV 111.8 (*)    MCH 35.1 (*)    RDW 15.6 (*)    All other components within normal limits  URINALYSIS, ROUTINE W REFLEX MICROSCOPIC - Abnormal; Notable for the following components:   Glucose, UA 50 (*)    Ketones, ur 80 (*)    All other components within normal limits  CBG MONITORING, ED - Abnormal; Notable for the following components:   Glucose-Capillary 149 (*)    All other components within normal limits  TROPONIN I (HIGH SENSITIVITY) - Abnormal; Notable for the following components:   Troponin I (High Sensitivity) 20 (*)    All other components within normal limits  CULTURE, BLOOD (ROUTINE X 2)  CULTURE, BLOOD (ROUTINE X 2)  URINE CULTURE  SARS CORONAVIRUS 2  LACTIC ACID, PLASMA  APTT  PROTIME-INR  D-DIMER, QUANTITATIVE (NOT AT [facility])  PROCALCITONIN  LACTATE DEHYDROGENASE  FERRITIN  TRIGLYCERIDES  FIBRINOGEN  C-REACTIVE PROTEIN  TROPONIN I (HIGH SENSITIVITY)    EKG EKG Interpretation  Date/Time:  Monday February 05 2019 10:28:19 EDT Ventricular Rate:  106 PR Interval:    QRS Duration: 95 QT Interval:  378 QTC Calculation: 460 R Axis:   12 Text Interpretation:  likely sinus arrythmia Ventricular premature complex Anterior infarct, old Baseline wander in lead(s) V1 Otherwise no significant change Confirmed by Deno Etienne [phone]) on 02/05/2019 10:41:38 AM   Radiology Dg Chest Port 1 View  Result Date: 02/05/2019 CLINICAL DATA:  Generalized weakness and dizziness EXAM: PORTABLE CHEST 1 VIEW COMPARISON:  January 31, 2019 FINDINGS: There is subtle ill-defined opacity in each lung base, concerning for bibasilar pneumonia. Lungs elsewhere clear. Heart size and pulmonary vascularity are normal. No adenopathy. There is aortic atherosclerosis. No  bone lesions. IMPRESSION: Subtle ill-defined opacity in each lung base, likely early pneumonia in each lung base. Lungs elsewhere clear. Stable cardiac silhouette. Aortic Atherosclerosis (ICD10-I70.0). Electronically Signed   By: Lowella Grip III M.D.   On: 02/05/2019 11:18    Procedures Procedures (including critical care time)  Medications Ordered in ED Medications  azithromycin (ZITHROMAX) 500 mg in sodium chloride 0.9 % 250 mL IVPB (500 mg Intravenous New Bag/Given 02/05/19 1525)  cefTRIAXone (ROCEPHIN) 1 g in sodium chloride 0.9 % 100 mL IVPB (0 g Intravenous Stopped 02/05/19 1314)     Initial Impression / Assessment and Plan / ED Course  I have reviewed the triage vital signs and the nursing notes.  Pertinent labs & imaging results that were available during my care of the patient were reviewed by me and considered in my medical decision making (see chart for details).    73 year old female who presents with complaints of dizziness and generalized weakness began this a.m.  Per patient she felt fine yesterday.  Per husband patient has had a lack of appetite for the past week.  Upon EMS arrival patient was found to have a CBG of 60.  Was administered D10  IV fluids and CBG went up to 224.  CBG 149 in the ED today.  He does not have a history of diabetes.  Oral temp of 97.6.  Rectally 94.6.  Patient placed on Bair hugger.  She has no other complaints at this time.  Will work-up for infection today.  Recently blood work done by PCP.  White blood cell count of 19,000.  Was not worked up further.  Patient is on chronic 5 mg prednisone which may be cause.  Patient was seen in the ED on 8/20 for a mechanical fall.  Had chest x-ray done at that time with no acute findings.  CT head and CT C-spine negative as well.  States that she has been off of her Eliquis for the past 2 weeks.  Reports that her cardiologist told her that she choose whether or not she wanted to be on it.  Patient chose to get off  of it.  She does have a history of A. fib.  Is currently on amiodarone and diltiazem for.  Pt appears to be in A. fib currently.  No leukocytosis today.  Hemoglobin stable.  Potassium mildly decreased at 3.2.  Will replete.  Had a nausea normal limits.  Initial troponin of 17.  Will repeat.  KG without ischemic changes.  Chest x-ray does show questionable bilateral pneumonia.  Will swab for COVID at this time.  We will start her on ceftriaxone and azithromycin in the ED today.  Repeat rectal temp at 96.4.  We will continue Bair hugger at this time.  Awaiting urinalysis.  This patient will probably need to be admitted at this time.   Discussed case with Dr. Jamse Arn who agrees to accept patient. She is requesting noncontrast CT Chest at this time for further eval of pneumonia given rapid covid tests not instock currently/high suspicion with bilateral pneumonia. Procalcitonin and other inflammatory markers ordered as well for preadmission.   3:51 PM At shift change case made aware to oncoming PA Irena Cords. Pt status changed to admission; pending CT chest.        Final Clinical Impressions(s) / ED Diagnoses   Final diagnoses:  Dizziness  Fatigue, unspecified type  Community acquired pneumonia, unspecified laterality    ED Discharge Orders    None       Eustaquio Maize, PA-C 02/05/19 [place], DO 02/07/19 1507

## 2019-02-05 NOTE — ED Notes (Signed)
Collected patient urine sample patient is resting with call bell in reach and family at bedside

## 2019-02-05 NOTE — H&P (Signed)
History and Physical:    Crystal [name]   [id number] DOB: Nov 06, 1945 DOA: 02/05/2019  Referring MD/provider: PA Alroy Bailiff PCP: [name], MD   Patient coming from: Home  Chief Complaint: Acute dizziness this morning and hypothermia  History of Present Illness:   Crystal [name] is an 73 y.o. female who is legally blind who was in her usual state of health until yesterday when she noted that she felt tired more than usual.  Last night on route to the bathroom she felt that her legs were kind of weak but she was able to get to the bathroom with assistance of her husband.  This morning however patient states that she could not get out of bed because she felt so weak and tired and dizzy.  She notes this is very unusual for her as she tries to be as active as she can be using a walker and her cane.   Patient denies fevers or chills.  No cough or shortness of breath.  She generally states she feels generally well except for the fact that she is very fatigued and dizzy as of this morning.  Denies any exposure to COVID as far she knows although she does admit to walking with her friend every morning.  She does stay [place] when she is in the presence of other people.  Of note patient had a fall last week which she states was mechanical.  She states she felt entirely well when she did trip and fall and hit her head.  She was worked up ED and states her work-up was negative.  She was noted to have an elevated WBC and she thought that was somewhat surprising but at that time did not have any fevers chills cough dysuria abdominal pain diarrhea or any other signs or symptoms of an infection.  Acute illness really just started this morning.   ED Course:  The patient was noted to be hypothermic and have infiltrates on single view AP chest x-ray.  She was treated with a bear hugger and ceftriaxone and azithromycin for community-acquired pneumonia.  ROS:   ROS   Review of Systems: General: No fever,  chills, weight changes Skin: No rashes, lesions, wounds Eyes: no discharge, redness, pain HENT: no ear pain, hearing loss, drainage, tinnitus Endocrine: no heat/cold intolerance, no polyuria Respiratory: No cough,, shortness of breath, hemoptysis Cardiovascular: No palpitations, chest pain GI: No nausea, vomiting, diarrhea, constipation GU: No dysuria, increased frequency Musculoskeletal: No back pain, joint pain Blood/lymphatics: No easy bruising, bleeding Mood/affect: No anxiety/depression    Past Medical History:   Past Medical History:  Diagnosis Date  . Adenomatous colon polyp 2007 & 2010    Dr [name]  . Allergy    seasonal  . Arthritis   . Cataract   . Diverticulosis   . 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 Hyperlipidemia   . Hypertension   . IBS (irritable bowel syndrome)   . Legally blind   . MVP (mitral valve prolapse)   . Paroxysmal atrial fibrillation with rapid ventricular response ([place]) 08/29/2017  . Peripheral vascular disease ([place])   . RAD (reactive airway disease)     Past Surgical History:   Past Surgical History:  Procedure Laterality Date  . ABDOMINAL HYSTERECTOMY     with USO for dysfunctionall menses  . APPENDECTOMY    . ARTERY BIOPSY Bilateral 07/21/2017   Procedure: BIOPSY BILATERAL TEMPORAL ARTERY;  Surgeon: [name], MD;  Location: [facility]  OR;  Service: Vascular;  Laterality: Bilateral;  . CARDIOVERSION N/A 06/16/2018   Procedure: CARDIOVERSION;  Surgeon: [name], MD;  Location: [facility];  Service: Cardiovascular;  Laterality: N/A;  . CATARACT EXTRACTION W/ INTRAOCULAR LENS IMPLANT  2013   bilateral; Dr [name]  . COLONOSCOPY  2013   negative , due 2018; Dr [name]  . COLONOSCOPY W/ POLYPECTOMY      X2 ; diverticulosis  . EMBOLECTOMY Right 08/29/2017   Procedure: Right Groin Exploration  ;  Surgeon: Rosetta Posner, MD;  Location: [facility];  Service: Vascular;  Laterality: Right;  . HIP PINNING,CANNULATED Right  03/21/2018  . HIP PINNING,CANNULATED Right 03/21/2018   Procedure: CANNULATED HIP PINNING;  Surgeon: Rod Can, MD;  Location: [place];  Service: Orthopedics;  Laterality: Right;  . LUMBAR LAMINECTOMY  2000   Dr Vertell Limber  . SHOULDER SURGERY     R shoulder  . TONSILLECTOMY      Social History:   Social History   Socioeconomic History  . Marital status: Married    Spouse name: Not on file  . Number of children: Not on file  . Years of education: Not on file  . Highest education level: Not on file  Occupational History  . Occupation: retired  Scientific laboratory technician  . Financial resource strain: Not on file  . Food insecurity    Worry: Not on file    Inability: Not on file  . Transportation needs    Medical: Not on file    Non-medical: Not on file  Tobacco Use  . Smoking status: Former Smoker    Packs/day: 1.00    Years: 23.00    Pack years: 23.00    Quit date: 06/14/1989    Years since quitting: 29.6  . Smokeless tobacco: Never Used  . Tobacco comment: smoked 1961-1991 , up to < 1 ppd  Substance and Sexual Activity  . Alcohol use: Yes    Alcohol/week: 14.0 standard drinks    Types: 14 Glasses of wine per week    Comment: Wine  . Drug use: No  . Sexual activity: Not on file  Lifestyle  . Physical activity    Days per week: Not on file    Minutes per session: Not on file  . Stress: Not on file  Relationships  . Social Herbalist on phone: Not on file    Gets together: Not on file    Attends religious service: Not on file    Active member of club or organization: Not on file    Attends meetings of clubs or organizations: Not on file    Relationship status: Not on file  . Intimate partner violence    Fear of current or ex partner: Not on file    Emotionally abused: Not on file    Physically abused: Not on file    Forced sexual activity: Not on file  Other Topics Concern  . Not on file  Social History Narrative  . Not on file    Allergies   Simvastatin   Family history:   Family History  Problem Relation Age of Onset  . Heart attack Brother 26  . Parkinsonism Mother   . Stroke Mother   . Heart attack Father        pre 44  . Diabetes Sister   . COPD Sister        X 3  . Other Son        suicide  2016  . Colon cancer Neg Hx   . Cancer Neg Hx   . Esophageal cancer Neg Hx   . Liver cancer Neg Hx   . Pancreatic cancer Neg Hx   . Rectal cancer Neg Hx   . Stomach cancer Neg Hx     Current Medications:   Prior to Admission medications   Medication Sig Start Date End Date Taking? Authorizing Provider  albuterol (VENTOLIN HFA) 108 (90 Base) MCG/ACT inhaler INHALE 1 PUFF INTO THE LUNGS AS DIRECTED AS NEEDED 01/12/19  Yes Burns, Claudina Lick, MD  alendronate (FOSAMAX) 70 MG tablet Take 70 mg by mouth every Monday.  01/27/18  Yes [provider]  amiodarone (PACERONE) 200 MG tablet Take 1 tablet (200 mg total) by mouth daily. 08/14/18  Yes [name], MD  Calcium Citrate-Vitamin D (CALCIUM + D PO) Take 1 tablet by mouth daily.   Yes [provider]  Cholecalciferol (VITAMIN D3) 1000 units CAPS Take 1 capsule (1,000 Units total) by mouth daily with lunch. 03/30/18  Yes Angiulli, Lavon Paganini, PA-C  diltiazem (CARDIZEM CD) 120 MG 24 hr capsule TAKE ONE CAPSULE BY MOUTH DAILY 08/28/18  Yes Crenshaw, Denice Bors, MD  ELIQUIS 5 MG TABS tablet TAKE 1 TABLET BY MOUTH TWO TIMES A DAY 11/14/18  Yes [name], MD  furosemide (LASIX) 40 MG tablet TAKE ONE TABLET BY MOUTH DAILY Patient taking differently: Take 40 mg by mouth daily.  07/10/18  Yes Burns, Claudina Lick, MD  gabapentin (NEURONTIN) 100 MG capsule Take 5 capsules (500 mg total) by mouth at bedtime. Patient taking differently: Take 900 mg by mouth at bedtime.  03/30/18  Yes Angiulli, Lavon Paganini, PA-C  levothyroxine (SYNTHROID) 50 MCG tablet Take 1 tablet (50 mcg total) by mouth daily. 02/01/19  Yes Burns, Claudina Lick, MD  LORazepam (ATIVAN) 1 MG tablet TAKE ONE TABLET BY MOUTH EVERY NIGHT AT  BEDTIME 01/29/19  Yes Burns, Claudina Lick, MD  methocarbamol (ROBAXIN) 500 MG tablet Take 1 tablet (500 mg total) by mouth every 6 (six) hours as needed for muscle spasms. 07/11/18  Yes Burns, Claudina Lick, MD  montelukast (SINGULAIR) 10 MG tablet Take 1 tablet (10 mg total) by mouth at bedtime. 01/03/19  Yes Burns, Claudina Lick, MD  potassium chloride SA (K-DUR,KLOR-CON) 20 MEQ tablet TAKE TWO TABLETS BY MOUTH DAILY 09/18/18  Yes Burns, Claudina Lick, MD  predniSONE (DELTASONE) 5 MG tablet Take 5 mg by mouth daily with breakfast.    Yes [provider]  SYMBICORT 160-4.5 MCG/ACT inhaler INHALE ONE TO TWO PUFFS BY MOUTH EVERY 12 HOURS, THEN GARGLE AND SPIT AFTER USE 11/16/18  Yes Burns, Claudina Lick, MD  theophylline (UNIPHYL) 400 MG 24 hr tablet TAKE 1 TABLET (400 MG TOTAL) BY MOUTH DAILY. Patient taking differently: Take 200 mg by mouth 2 (two) times daily.  07/10/18  Yes Burns, Claudina Lick, MD  traMADol (ULTRAM) 50 MG tablet Take 1 tablet (50 mg total) by mouth every 6 (six) hours as needed for moderate pain. 08/09/18  Yes Burns, Claudina Lick, MD  triamcinolone cream (KENALOG) 0.1 % Apply 1 application topically 2 (two) times daily. 01/02/19  Yes [name], MD    Physical Exam:   Vitals:   02/05/19 1730 02/05/19 1815 02/05/19 1830 02/05/19 1845  BP: (!) 126/55 (!) 129/56 (!) 124/56 (!) 126/58  Pulse: 66 72 71 72  Resp: 15 17 15 19   Temp:      TempSrc:  SpO2: 98% 96% 95% 100%  Weight:         Physical Exam: Blood pressure (!) 126/58, pulse 72, temperature (!) 97.5 F (36.4 C), temperature source Rectal, resp. rate 19, weight 57.6 kg, SpO2 100 %. Gen: Relatively well-appearing female lying in bed at 30 degrees no acute distress with attentive husband at bedside. Eyes: Sclerae anicteric. Conjunctiva mildly injected. Neck: Supple, no jugular venous distention. Chest: Moderately good air entry bilaterally with rales at bases  CV: Distant, irregular, over sick systolic murmur left sternal border without  radiation. Abdomen: NABS, soft, nondistended, nontender. No tenderness to light or deep palpation. No rebound, no guarding. Extremities: No edema.  Skin: Warm and dry. No rashes, lesions or wounds. Neuro: Alert and oriented times 3; grossly nonfocal patient is blind. Psych: Patient is cooperative, logical and coherent with appropriate mood and affect.  Data Review:    Labs: Basic Metabolic Panel: Recent Labs  Lab 02/05/19 1032  NA 136  K 3.2*  CL 97*  CO2 19*  GLUCOSE 159*  BUN 10  CREATININE 0.60  CALCIUM 9.0   Liver Function Tests: Recent Labs  Lab 02/05/19 1032  AST 71*  ALT 34  ALKPHOS 115  BILITOT 1.3*  PROT 6.5  ALBUMIN 3.8   No results for input(s): LIPASE, AMYLASE in the last 168 hours. No results for input(s): AMMONIA in the last 168 hours. CBC: Recent Labs  Lab 01/31/19 0948 02/05/19 1032  WBC 17.0* 8.3  NEUTROABS 13.1* 6.8  HGB 13.7 13.1  HCT 41.1 41.7  MCV 107.2* 111.8*  PLT 452.0* 286   Cardiac Enzymes: No results for input(s): CKTOTAL, CKMB, CKMBINDEX, TROPONINI in the last 168 hours.  BNP (last 3 results) No results for input(s): PROBNP in the last 8760 hours. CBG: Recent Labs  Lab 02/05/19 1021  GLUCAP 149*    Urinalysis    Component Value Date/Time   COLORURINE YELLOW 02/05/2019 1408   APPEARANCEUR CLEAR 02/05/2019 1408   LABSPEC 1.019 02/05/2019 1408   PHURINE 5.0 02/05/2019 1408   GLUCOSEU 50 (A) 02/05/2019 1408   GLUCOSEU NEGATIVE 04/12/2018 0746   HGBUR NEGATIVE 02/05/2019 1408   BILIRUBINUR NEGATIVE 02/05/2019 1408   KETONESUR 80 (A) 02/05/2019 1408   PROTEINUR NEGATIVE 02/05/2019 1408   UROBILINOGEN 4.0 (A) 04/12/2018 0746   NITRITE NEGATIVE 02/05/2019 1408   LEUKOCYTESUR NEGATIVE 02/05/2019 1408      Radiographic Studies: Ct Chest Wo Contrast  Result Date: 02/05/2019 CLINICAL DATA:  Weakness.  Evaluate pneumonia seen on chest x-ray EXAM: CT CHEST WITHOUT CONTRAST TECHNIQUE: Multidetector CT imaging of the  chest was performed following the standard protocol without IV contrast. COMPARISON:  Chest x-ray earlier today FINDINGS: Cardiovascular: Heart is normal size. Aorta is normal caliber. Scattered aortic atherosclerosis. Mediastinum/Nodes: No mediastinal, hilar, or axillary adenopathy. Trachea and esophagus are unremarkable. Thyroid unremarkable. Lungs/Pleura: Peripheral interstitial thickening and tree-in-bud nodular densities in both upper lobes and lower lobes, suspect chronic interstitial lung disease. Right middle lobe nodule measures 7 mm. Bibasilar scarring or atelectasis. No effusions. Upper Abdomen: Fatty infiltration of the liver. Musculoskeletal: Chest wall soft tissues are unremarkable. No acute bony abnormality. IMPRESSION: Peripheral interstitial opacities and tree-in-bud nodular densities in the lungs bilaterally, most pronounced in the lower lobes and bases as seen on chest x-ray today. Favor chronic interstitial lung disease. No other confluent opacities to suggest pneumonia. 7 mm right middle lobe nodule. Non-contrast chest CT at 6-12 months is recommended. If the nodule is stable at time of repeat CT, then  future CT at 18-24 months (from today's scan) is considered optional for low-risk patients, but is recommended for high-risk patients. This recommendation follows the consensus statement: Guidelines for Management of Incidental Pulmonary Nodules Detected on CT Images: From the Fleischner Society 2017; Radiology 2017; 284:228-243. Fatty infiltration of the liver Aortic Atherosclerosis (ICD10-I70.0). Electronically Signed   By: Rolm Baptise M.D.   On: 02/05/2019 18:28   Dg Chest Port 1 View  Result Date: 02/05/2019 CLINICAL DATA:  Generalized weakness and dizziness EXAM: PORTABLE CHEST 1 VIEW COMPARISON:  January 31, 2019 FINDINGS: There is subtle ill-defined opacity in each lung base, concerning for bibasilar pneumonia. Lungs elsewhere clear. Heart size and pulmonary vascularity are normal. No  adenopathy. There is aortic atherosclerosis. No bone lesions. IMPRESSION: Subtle ill-defined opacity in each lung base, likely early pneumonia in each lung base. Lungs elsewhere clear. Stable cardiac silhouette. Aortic Atherosclerosis (ICD10-I70.0). Electronically Signed   By: Lowella Grip III M.D.   On: 02/05/2019 11:18    EKG: Independently reviewed.  A. fib at 100.  Frequent PVCs.  Normal axis.  Q waves V1 through V3.  Poor baseline.   Assessment/Plan:   Principal Problem:   Community acquired pneumonia Active Problems:   Essential hypertension   Chronic obstructive airway disease with asthma (HCC)   Anxiety   PAF (paroxysmal atrial fibrillation) (HCC)   Hypothermia   CAP (community acquired pneumonia)   73 year old female is admitted with hypothermia and 1 day acute onset of dizziness and fatigue.  She was initially thought to have community-acquired pneumonia, possibly COVID pneumonia given infiltrates seen on chest x-ray even though she is afebrile with no cough, shortness of breath or leukocytosis.  Chest CT however has returned with diagnosis of likely interstitial lung disease which is chronic in nature.  Of note her procalcitonin level is less than 0.10 which also suggests against any bacterial process.  Her COVID test has also come back negative.  HYPOTHERMIA Cause of patient's hypothermia and acute dizziness and malaise is unclear. She has responded well to [facility] with normalization of temperature. No evidence of acute infection on laboratory data or on physical exam. Check TSH although she has no other signs and symptoms of profound hypothermia.  HYPOKALEMIA We will replete and recheck.  HTN Continue diltiazem as well as Lasix 40 mg daily This point I do not think patient is intravascularly volume depleted so I am continuing her Lasix.  COPD No evidence for acute flare.  Continue montelukast, Dulera and theophylline per home doses. PRN albuterol nebulizer  requested  PAF Continue amiodarone and diltiazem for rate and rhythm control Continue Eliquis for secondary stroke prevention  LEGAL BLINDNESS Falls precautions in place.  ANXIETY Continue lorazepam at bedtime per home doses  HYPOTHYROIDISM Continue Synthroid, check TSH given hypothermia  CHRONIC PAIN Continue gabapentin and methocarbamol per home doses    Other information:   DVT prophylaxis: On Eliquis Code Status: Full code. Family Communication: Patient's husband was at bedside throughout Disposition Plan: Home Consults called: None Admission status: Observation  The medical decision making on this patient was of high complexity and the patient is at high risk for clinical deterioration, therefore this is a level 3 visit.  Dewaine Oats Tublu Lakeeta Dobosz [facility]  If 7PM-7AM, please contact night-coverage [email or web address] Password [email or web address] 02/05/2019, 7:00 PM

## 2019-02-06 DIAGNOSIS — T68XXXA Hypothermia, initial encounter: Secondary | ICD-10-CM | POA: Diagnosis not present

## 2019-02-06 LAB — BASIC METABOLIC PANEL
Anion gap: 11 (ref 5–15)
BUN: 7 mg/dL — ABNORMAL LOW (ref 8–23)
CO2: 23 mmol/L (ref 22–32)
Calcium: 8.5 mg/dL — ABNORMAL LOW (ref 8.9–10.3)
Chloride: 101 mmol/L (ref 98–111)
Creatinine, Ser: 0.72 mg/dL (ref 0.44–1.00)
GFR calc Af Amer: >60 mL/min (ref >60)
GFR calc non Af Amer: >60 mL/min (ref >60)
Glucose, Bld: 62 mg/dL — ABNORMAL LOW (ref 70–99)
Potassium: 4.5 mmol/L (ref 3.5–5.1)
Sodium: 135 mmol/L (ref 135–145)

## 2019-02-06 LAB — CBC
HCT: 36.5 % (ref 36.0–46.0)
Hemoglobin: 11.9 g/dL — ABNORMAL LOW (ref 12.0–15.0)
MCH: 36 pg — ABNORMAL HIGH (ref 26.0–34.0)
MCHC: 32.6 g/dL (ref 30.0–36.0)
MCV: 110.3 fL — ABNORMAL HIGH (ref 80.0–100.0)
Platelets: 256 10*3/uL (ref 150–400)
RBC: 3.31 MIL/uL — ABNORMAL LOW (ref 3.87–5.11)
RDW: 15.8 % — ABNORMAL HIGH (ref 11.5–15.5)
WBC: 6.4 10*3/uL (ref 4.0–10.5)
nRBC: 0 % (ref 0.0–0.2)

## 2019-02-06 LAB — URINE CULTURE

## 2019-02-06 LAB — TSH: TSH: 5.702 u[IU]/mL — ABNORMAL HIGH (ref 0.350–4.500)

## 2019-02-06 NOTE — Evaluation (Signed)
Physical Therapy Evaluation Patient Details Name: Crystal [name] MRN: [id number] DOB: 11-20-1945 Today's Date: 02/06/2019   History of Present Illness  Pt is 73 yo female with HTN, HLD, A-fib and legal blindness who presents with weakness and was found to be hypothermic and with CAP. Of note, pt presented to ED last week with fall, head CT neg.   Clinical Impression  Patient evaluated by Physical Therapy with no further acute PT needs identified. All education has been completed and the patient has no further questions. Pt's dizziness has resolved and she is mobilizing at [facility]. Independent for bed mobility, supervision for transfers, and min A for ambulation due to visual deficits.  See below for any follow-up Physical Therapy or equipment needs. PT is signing off. Thank you for this referral.     Follow Up Recommendations No PT follow up, pt's daughter in law is PT and has been working with her and will continue to do so when she gets home.     Equipment Recommendations  None recommended by PT    Recommendations for Other Services       Precautions / Restrictions Precautions Precautions: Fall Precaution Comments: pt is blind Restrictions Weight Bearing Restrictions: No      Mobility  Bed Mobility Overal bed mobility: Modified Independent             General bed mobility comments: pt able to get to EOB without use of rail and bed nearly flat  Transfers Overall transfer level: Modified independent Equipment used: Rolling walker (2 wheeled)             General transfer comment: pt stood to RW and was cautious to wait before ambulating to check for dizziness  Ambulation/Gait Ambulation/Gait assistance: Min assist Gait Distance (Feet): 200 Feet Assistive device: Rolling walker (2 wheeled) Gait Pattern/deviations: Step-through pattern;Decreased stride length Gait velocity: decreased Gait velocity interpretation: <1.8 ft/sec, indicate of risk for recurrent  falls General Gait Details: min A needed to guide RW due to visual deficit  Financial trader Rankin (Stroke Patients Only)       Balance Overall balance assessment: Mild deficits observed, not formally tested                                           Pertinent Vitals/Pain Pain Assessment: No/denies pain    Home Living Family/patient expects to be discharged to:: Private residence Living Arrangements: Spouse/significant other Available Help at Discharge: Family Type of Home: House Home Access: Stairs to enter Entrance Stairs-Rails: Left Entrance Stairs-Number of Steps: 3 steps in garage with left rail, 3 smaller steps at front door without rail Home Layout: Multi-level;Able to live on main level with bedroom/bathroom Home Equipment: Kasandra Knudsen - single point;Walker - 2 wheels;Bedside commode Additional Comments: home also has a sunken den with 1 step no rails    Prior Function Level of Independence: Independent with assistive device(s);Needs assistance   Gait / Transfers Assistance Needed: ambulates with RW or when out of home ambulates with cane and husband's arm  ADL's / Homemaking Assistance Needed: husband helps when needed (has needed help since blindness)        Hand Dominance   Dominant Hand: Right    Extremity/Trunk Assessment   Upper Extremity Assessment Upper Extremity Assessment: Overall WFL for tasks  assessed    Lower Extremity Assessment Lower Extremity Assessment: Generalized weakness    Cervical / Trunk Assessment Cervical / Trunk Assessment: Kyphotic  Communication   Communication: No difficulties  Cognition Arousal/Alertness: Awake/alert Behavior During Therapy: WFL for tasks assessed/performed Overall Cognitive Status: Within Functional Limits for tasks assessed                                        General Comments General comments (skin integrity, edema, etc.): pt  very dizzy when she came in, dizziness has not resolved, not recreated on eval    Exercises     Assessment/Plan    PT Assessment All further PT needs can be met in the next venue of care  PT Problem List Decreased mobility;Decreased strength;Decreased balance       PT Treatment Interventions      PT Goals (Current goals can be found in the Care Plan section)  Acute Rehab PT Goals Patient Stated Goal: return home PT Goal Formulation: With patient    Frequency     Barriers to discharge        Co-evaluation               AM-PAC PT "6 Clicks" Mobility  Outcome Measure Help needed turning from your back to your side while in a flat bed without using bedrails?: None Help needed moving from lying on your back to sitting on the side of a flat bed without using bedrails?: None Help needed moving to and from a bed to a chair (including a wheelchair)?: A Little Help needed standing up from a chair using your arms (e.g., wheelchair or bedside chair)?: A Little Help needed to walk in hospital room?: A Little Help needed climbing 3-5 steps with a railing? : A Little 6 Click Score: 20    End of Session Equipment Utilized During Treatment: Gait belt Activity Tolerance: Patient tolerated treatment well Patient left: in bed;with call bell/phone within reach;with family/visitor present Nurse Communication: Mobility status PT Visit Diagnosis: Dizziness and giddiness (R42);Difficulty in walking, not elsewhere classified (R26.2)    Time: 2505-3976 PT Time Calculation (min) (ACUTE ONLY): 25 min   Charges:   PT Evaluation $PT Eval Moderate Complexity: 1 Mod PT Treatments $Gait Training: 8-22 mins        Leighton Roach, PT  Acute Rehab Services  Pager [phone] Office [place] 02/06/2019, 1:41 PM

## 2019-02-06 NOTE — Discharge Summary (Signed)
Physician Discharge Summary  Crystal [name] [id number] DOB: 05/25/1946 DOA: 02/05/2019  PCP: [name], MD  Admit date: 02/05/2019 Discharge date: 02/06/2019  Time spent: 45 minutes  Recommendations for Outpatient Follow-up:  1. Follow up with PCP 1-2 weeks for evaluation of symptoms  Discharge Diagnoses:  Principal Problem:   Community acquired pneumonia Active Problems:   Essential hypertension   Chronic obstructive airway disease with asthma ([place])   Anxiety   PAF (paroxysmal atrial fibrillation) ([place])   Hypothermia   CAP (community acquired pneumonia)   Discharge Condition: stable  Diet recommendation: regular  Filed Weights   02/05/19 1036 02/05/19 2103  Weight: 57.6 kg 56.3 kg    History of present illness:  Crystal [name] is a 73 y.o. female who is legally blind who was in her usual state of health until 8/23 when she noted that she felt tired more than usual.  That night on route to the bathroom she felt that her legs were kind of weak but she was able to get to the bathroom with assistance of her husband.  on 8/24 could not get out of bed because she felt so weak and tired and dizzy.  She noted this is very unusual for her as she tries to be as active as she can be using a walker and her cane. She presented to ED on 8/24.  Patient denied fevers or chills.  No cough or shortness of breath.  She stated she feels generally well except for the fact that she is very fatigued and dizzy.  Denied any exposure to COVID as far she knows.  Of note patient had a fall last week which she stated was mechanical.  She stated she felt entirely well when she did trip and fall and hit her head.  She was worked up ED and stated her work-up was negative.  She was noted to have an elevated WBC and she thought that was somewhat surprising but at that time did not have any fevers chills cough dysuria abdominal pain diarrhea or any other signs or symptoms of an infection.  Acute illness really  just started the morning of 8/24.  Hospital Course:   generalized weakness. She was initially thought to have community-acquired pneumonia, possibly COVID pneumonia given infiltrates seen on chest x-ray even though she was afebrile with no cough, shortness of breath or leukocytosis.  Chest CT however has returned with diagnosis of likely interstitial lung disease which is chronic in nature.  Of note her procalcitonin level was less than 0.10 which also suggested against any bacterial process.  Her COVID test has also come back negative. Provided with IV antibiotics x1. Provided with IV fluids. On day of discharge much stronger. Evaluated by PT who opined no HH needed for PT  HYPOTHERMIA. Resolved this am after bear hugger.  Cause of patient's hypothermia and acute dizziness and malaise unclear. No evidence of acute infection on laboratory data or on physical exam. No metabolic derangement. TSH 5.7 down from 13.6 last week.   HYPOKALEMIA. Resolved this am after repletion. Home meds include lasix 40mg  and potassium 45meq.  HTN. Controlled. Home meds include  diltiazem as well as Lasix 40 mg daily. Resume home meds at discharge  COPD No evidence for acute flare.  Continue montelukast, Dulera and theophylline per home doses. PRN albuterol nebulizer requested  PAF. ekg with SR. Continue amiodarone and diltiazem for rate and rhythm control.Continue Eliquis for secondary stroke prevention  LEGAL BLINDNESS Falls precautions in  place.  ANXIETY Continue lorazepam at bedtime per home doses  HYPOTHYROIDISM Continue Synthroid. TSH as noted above.   CHRONIC PAIN Continue gabapentin and methocarbamol per home doses   Procedures:    Consultations:    Discharge Exam: Vitals:   02/06/19 0543 02/06/19 0838  BP: (!) 136/59 (!) 141/69  Pulse: 72 92  Resp: 17 18  Temp: 98 F (36.7 C) 98.5 F (36.9 C)  SpO2: 98% 94%    General: awake alert sitting on side of bed. No acute  distress Cardiovascular: rrr no mgr no LE edema Respiratory: normal effort BS distant in bases but clear in mid/upperlobes  Discharge Instructions   Discharge Instructions    Call MD for:  difficulty breathing, headache or visual disturbances   Complete by: As directed    Call MD for:  persistant dizziness or light-headedness   Complete by: As directed    Call MD for:  persistant nausea and vomiting   Complete by: As directed    Call MD for:  temperature >100.4   Complete by: As directed    Diet - low sodium heart healthy   Complete by: As directed    Discharge instructions   Complete by: As directed    Follow up with PCP 1-2 weeks for evaluation of symptoms.   Increase activity slowly   Complete by: As directed      Allergies as of 02/06/2019      Reactions   Simvastatin    myalgias      Medication List    TAKE these medications   albuterol 108 (90 Base) MCG/ACT inhaler Commonly known as: VENTOLIN HFA INHALE 1 PUFF INTO THE LUNGS AS DIRECTED AS NEEDED   alendronate 70 MG tablet Commonly known as: FOSAMAX Take 70 mg by mouth every Monday.   amiodarone 200 MG tablet Commonly known as: PACERONE Take 1 tablet (200 mg total) by mouth daily.   CALCIUM + D PO Take 1 tablet by mouth daily.   diltiazem 120 MG 24 hr capsule Commonly known as: CARDIZEM CD TAKE ONE CAPSULE BY MOUTH DAILY   Eliquis 5 MG Tabs tablet Generic drug: apixaban TAKE 1 TABLET BY MOUTH TWO TIMES A DAY   furosemide 40 MG tablet Commonly known as: LASIX TAKE ONE TABLET BY MOUTH DAILY   gabapentin 100 MG capsule Commonly known as: NEURONTIN Take 5 capsules (500 mg total) by mouth at bedtime. What changed: how much to take   levothyroxine 50 MCG tablet Commonly known as: SYNTHROID Take 1 tablet (50 mcg total) by mouth daily.   LORazepam 1 MG tablet Commonly known as: ATIVAN TAKE ONE TABLET BY MOUTH EVERY NIGHT AT BEDTIME   methocarbamol 500 MG tablet Commonly known as: Robaxin Take 1  tablet (500 mg total) by mouth every 6 (six) hours as needed for muscle spasms.   montelukast 10 MG tablet Commonly known as: SINGULAIR Take 1 tablet (10 mg total) by mouth at bedtime.   potassium chloride SA 20 MEQ tablet Commonly known as: K-DUR TAKE TWO TABLETS BY MOUTH DAILY   predniSONE 5 MG tablet Commonly known as: DELTASONE Take 5 mg by mouth daily with breakfast.   Symbicort 160-4.5 MCG/ACT inhaler Generic drug: budesonide-formoterol INHALE ONE TO TWO PUFFS BY MOUTH EVERY 12 HOURS, THEN GARGLE AND SPIT AFTER USE   theophylline 400 MG 24 hr tablet Commonly known as: UNIPHYL TAKE 1 TABLET (400 MG TOTAL) BY MOUTH DAILY. What changed: See the new instructions.   traMADol 50 MG tablet  Commonly known as: ULTRAM Take 1 tablet (50 mg total) by mouth every 6 (six) hours as needed for moderate pain.   triamcinolone cream 0.1 % Commonly known as: KENALOG Apply 1 application topically 2 (two) times daily.   Vitamin D3 25 MCG (1000 UT) Caps Take 1 capsule (1,000 Units total) by mouth daily with lunch.      Allergies  Allergen Reactions  . Simvastatin     myalgias      The results of significant diagnostics from this hospitalization (including imaging, microbiology, ancillary and laboratory) are listed below for reference.    Significant Diagnostic Studies: Dg Chest 2 View  Result Date: 01/31/2019 CLINICAL DATA:  In the overload surveillance EXAM: CHEST - 2 VIEW COMPARISON:  08/09/2018 FINDINGS: Heart and mediastinal contours are within normal limits. No focal opacities or effusions. No acute bony abnormality. IMPRESSION: No active cardiopulmonary disease. Electronically Signed   By: Rolm Baptise M.D.   On: 01/31/2019 13:00   Ct Head Wo Contrast  Result Date: 02/01/2019 CLINICAL DATA:  Head injury after fall. EXAM: CT HEAD WITHOUT CONTRAST CT CERVICAL SPINE WITHOUT CONTRAST TECHNIQUE: Multidetector CT imaging of the head and cervical spine was performed following the  standard protocol without intravenous contrast. Multiplanar CT image reconstructions of the cervical spine were also generated. COMPARISON:  CT scan of August 04, 2018. FINDINGS: CT HEAD FINDINGS Brain: Minimal chronic ischemic white matter disease is noted. No mass effect or midline shift is noted. Ventricular size is within normal limits. There is no evidence of mass lesion, hemorrhage or acute infarction. Vascular: No hyperdense vessel or unexpected calcification. Skull: Normal. Negative for fracture or focal lesion. Sinuses/Orbits: No acute finding. Other: Small posterior scalp hematoma is noted. CT CERVICAL SPINE FINDINGS Alignment: Minimal grade 1 anterolisthesis of C4-5 is noted secondary to posterior facet joint hypertrophy. Skull base and vertebrae: No acute fracture. No primary bone lesion or focal pathologic process. Soft tissues and spinal canal: No prevertebral fluid or swelling. No visible canal hematoma. Disc levels: Severe degenerative disc disease is noted at C5-6 and C6-7 with anterior posterior osteophyte formation. Upper chest: Negative. Other: Degenerative changes are seen involving posterior facet joints bilaterally. IMPRESSION: Small posterior scalp hematoma. No acute intracranial abnormality seen. Severe multilevel degenerative disc disease. No acute abnormality seen in the cervical spine. Electronically Signed   By: Marijo Conception M.D.   On: 02/01/2019 14:54   Ct Chest Wo Contrast  Result Date: 02/05/2019 CLINICAL DATA:  Weakness.  Evaluate pneumonia seen on chest x-ray EXAM: CT CHEST WITHOUT CONTRAST TECHNIQUE: Multidetector CT imaging of the chest was performed following the standard protocol without IV contrast. COMPARISON:  Chest x-ray earlier today FINDINGS: Cardiovascular: Heart is normal size. Aorta is normal caliber. Scattered aortic atherosclerosis. Mediastinum/Nodes: No mediastinal, hilar, or axillary adenopathy. Trachea and esophagus are unremarkable. Thyroid unremarkable.  Lungs/Pleura: Peripheral interstitial thickening and tree-in-bud nodular densities in both upper lobes and lower lobes, suspect chronic interstitial lung disease. Right middle lobe nodule measures 7 mm. Bibasilar scarring or atelectasis. No effusions. Upper Abdomen: Fatty infiltration of the liver. Musculoskeletal: Chest wall soft tissues are unremarkable. No acute bony abnormality. IMPRESSION: Peripheral interstitial opacities and tree-in-bud nodular densities in the lungs bilaterally, most pronounced in the lower lobes and bases as seen on chest x-ray today. Favor chronic interstitial lung disease. No other confluent opacities to suggest pneumonia. 7 mm right middle lobe nodule. Non-contrast chest CT at 6-12 months is recommended. If the nodule is stable at time of repeat  CT, then future CT at 18-24 months (from today's scan) is considered optional for low-risk patients, but is recommended for high-risk patients. This recommendation follows the consensus statement: Guidelines for Management of Incidental Pulmonary Nodules Detected on CT Images: From the Fleischner Society 2017; Radiology 2017; 284:228-243. Fatty infiltration of the liver Aortic Atherosclerosis (ICD10-I70.0). Electronically Signed   By: Rolm Baptise M.D.   On: 02/05/2019 18:28   Ct Cervical Spine Wo Contrast  Result Date: 02/01/2019 CLINICAL DATA:  Head injury after fall. EXAM: CT HEAD WITHOUT CONTRAST CT CERVICAL SPINE WITHOUT CONTRAST TECHNIQUE: Multidetector CT imaging of the head and cervical spine was performed following the standard protocol without intravenous contrast. Multiplanar CT image reconstructions of the cervical spine were also generated. COMPARISON:  CT scan of August 04, 2018. FINDINGS: CT HEAD FINDINGS Brain: Minimal chronic ischemic white matter disease is noted. No mass effect or midline shift is noted. Ventricular size is within normal limits. There is no evidence of mass lesion, hemorrhage or acute infarction.  Vascular: No hyperdense vessel or unexpected calcification. Skull: Normal. Negative for fracture or focal lesion. Sinuses/Orbits: No acute finding. Other: Small posterior scalp hematoma is noted. CT CERVICAL SPINE FINDINGS Alignment: Minimal grade 1 anterolisthesis of C4-5 is noted secondary to posterior facet joint hypertrophy. Skull base and vertebrae: No acute fracture. No primary bone lesion or focal pathologic process. Soft tissues and spinal canal: No prevertebral fluid or swelling. No visible canal hematoma. Disc levels: Severe degenerative disc disease is noted at C5-6 and C6-7 with anterior posterior osteophyte formation. Upper chest: Negative. Other: Degenerative changes are seen involving posterior facet joints bilaterally. IMPRESSION: Small posterior scalp hematoma. No acute intracranial abnormality seen. Severe multilevel degenerative disc disease. No acute abnormality seen in the cervical spine. Electronically Signed   By: Marijo Conception M.D.   On: 02/01/2019 14:54   Dg Chest Port 1 View  Result Date: 02/05/2019 CLINICAL DATA:  Generalized weakness and dizziness EXAM: PORTABLE CHEST 1 VIEW COMPARISON:  January 31, 2019 FINDINGS: There is subtle ill-defined opacity in each lung base, concerning for bibasilar pneumonia. Lungs elsewhere clear. Heart size and pulmonary vascularity are normal. No adenopathy. There is aortic atherosclerosis. No bone lesions. IMPRESSION: Subtle ill-defined opacity in each lung base, likely early pneumonia in each lung base. Lungs elsewhere clear. Stable cardiac silhouette. Aortic Atherosclerosis (ICD10-I70.0). Electronically Signed   By: Lowella Grip III M.D.   On: 02/05/2019 11:18   Dg Shoulder Left  Result Date: 02/01/2019 CLINICAL DATA:  Post fall earlier today, now with left shoulder pain. EXAM: LEFT SHOULDER - 2+ VIEW COMPARISON:  None. FINDINGS: No fracture or dislocation. Mild degenerative change of the right glenohumeral and acromioclavicular joints with  joint space loss, subchondral sclerosis and osteophytosis. No evidence of calcific tendinitis. Limited visualization of the adjacent thorax is normal. Regional soft tissues appear normal. IMPRESSION: 1. No acute findings. 2. Mild degenerative change of the left AC and glenohumeral joints. Electronically Signed   By: Sandi Mariscal M.D.   On: 02/01/2019 14:31    Microbiology: Recent Results (from the past 240 hour(s))  Blood Culture (routine x 2)     Status: None (Preliminary result)   Collection Time: 02/05/19 11:02 AM   Specimen: BLOOD RIGHT WRIST  Result Value Ref Range Status   Specimen Description BLOOD RIGHT WRIST  Final   Special Requests   Final    BOTTLES DRAWN AEROBIC AND ANAEROBIC Blood Culture results may not be optimal due to an inadequate volume of blood  received in culture bottles   Culture   Final    NO GROWTH < 24 HOURS Performed at [facility], [place] [address] [place], [place] [address]    Report Status PENDING  Incomplete  Blood Culture (routine x 2)     Status: None (Preliminary result)   Collection Time: 02/05/19 11:41 AM   Specimen: BLOOD RIGHT HAND  Result Value Ref Range Status   Specimen Description BLOOD RIGHT HAND  Final   Special Requests   Final    BOTTLES DRAWN AEROBIC AND ANAEROBIC Blood Culture adequate volume   Culture   Final    NO GROWTH < 24 HOURS Performed at [facility], [place] [address] [place], [place] [address]    Report Status PENDING  Incomplete  SARS CORONAVIRUS 2 (TAT 6-12 HRS) Nasal Swab Aptima Multi Swab     Status: None   Collection Time: 02/05/19 12:33 PM   Specimen: Aptima Multi Swab; Nasal Swab  Result Value Ref Range Status   SARS Coronavirus 2 NEGATIVE NEGATIVE Final    Comment: (NOTE) SARS-CoV-2 target nucleic acids are NOT DETECTED. The SARS-CoV-2 RNA is generally detectable in upper and lower respiratory specimens during the acute phase of infection. Negative results do not preclude SARS-CoV-2 infection, do not  rule out co-infections with other pathogens, and should not be used as the sole basis for treatment or other patient management decisions. Negative results must be combined with clinical observations, patient history, and epidemiological information. The expected result is Negative. Fact Sheet for Patients: SugarRoll.be Fact Sheet for Healthcare Providers: https://www.woods-mathews.com/ This test is not yet approved or cleared by the [place] FDA and  has been authorized for detection and/or diagnosis of SARS-CoV-2 by FDA under an Emergency Use Authorization (EUA). This EUA will remain  in effect (meaning this test can be used) for the duration of the COVID-19 declaration under Section 56 4(b)(1) of the Act, 21 U.S.C. section 360bbb-3(b)(1), unless the authorization is terminated or revoked sooner. Performed at [facility], [place] [address] [place], [place] [address]   Urine culture     Status: None   Collection Time: 02/05/19  2:08 PM   Specimen: In/Out Cath Urine  Result Value Ref Range Status   Specimen Description IN/OUT CATH URINE  Final   Special Requests   Final    NONE Performed at [facility], [place] [address] [place], [place] [address]    Culture   Final    Multiple bacterial morphotypes present, none predominant. Suggest appropriate recollection if clinically indicated.   Report Status 02/06/2019 FINAL  Final     Labs: Basic Metabolic Panel: Recent Labs  Lab 02/05/19 1032 02/06/19 0500  NA 136 135  K 3.2* 4.5  CL 97* 101  CO2 19* 23  GLUCOSE 159* 62*  BUN 10 7*  CREATININE 0.60 0.72  CALCIUM 9.0 8.5*   Liver Function Tests: Recent Labs  Lab 02/05/19 1032  AST 71*  ALT 34  ALKPHOS 115  BILITOT 1.3*  PROT 6.5  ALBUMIN 3.8   No results for input(s): LIPASE, AMYLASE in the last 168 hours. No results for input(s): AMMONIA in the last 168 hours. CBC: Recent Labs  Lab 01/31/19 0948  02/05/19 1032 02/06/19 0500  WBC 17.0* 8.3 6.4  NEUTROABS 13.1* 6.8  --   HGB 13.7 13.1 11.9*  HCT 41.1 41.7 36.5  MCV 107.2* 111.8* 110.3*  PLT 452.0* 286 256   Cardiac Enzymes: No results  for input(s): CKTOTAL, CKMB, CKMBINDEX, TROPONINI in the last 168 hours. BNP: BNP (last 3 results) Recent Labs    03/18/18 0652 07/10/18 1916  BNP 534.0* 123.0*    ProBNP (last 3 results) No results for input(s): PROBNP in the last 8760 hours.  CBG: Recent Labs  Lab 02/05/19 1021  GLUCAP 149*       Signed:  Radene Gunning NP.  [facility] 02/06/2019, 11:36 AM

## 2019-02-06 NOTE — Discharge Instructions (Signed)

## 2019-02-07 ENCOUNTER — Telehealth: Payer: Self-pay | Admitting: *Deleted

## 2019-02-07 NOTE — Telephone Encounter (Signed)
Transition Care Management Follow-up Telephone Call  How have you been since you were released from the hospital? Husband states that she seems to be doing pretty good, her dizziness is better.    Do you understand why you were in the hospital? yes   Do you understand the discharge instrcutions? yes  Items Reviewed:  Medications reviewed: yes  Allergies reviewed: yes  Dietary changes reviewed: yes  Referrals reviewed: yes   Functional Questionnaire:   Activities of Daily Living (ADLs):   She states they are independent in the following: ambulation, bathing and hygiene, feeding, continence, grooming, toileting and dressing States they require assistance with the following:    Any transportation issues/concerns?: no   Any patient concerns? Yes, concerns that she is having headaches which may be residual from her fall where she hit her head.    Confirmed importance and date/time of follow-up visits scheduled: yes, 02/09/19 @ 10:00   Confirmed with patient if condition begins to worsen call PCP or go to the ER.  Patient was given the Call-a-Nurse line [phone]: yes

## 2019-02-08 NOTE — Progress Notes (Signed)
Subjective:    Patient ID: Crystal [name], female    DOB: 1945/07/17, 73 y.o.   MRN: [id number]  HPI The patient is here for follow up from the hospital.  She is here with her husband.  Admitted 8/24-8/25  She went to the emergency room due to not feeling well.  The day before going emergency room she felt more tired than usual.  That night when going to the bathroom she felt that her legs were kind of weak but she was able to get to the bathroom with assistance from her husband.  The next day, the day she was admitted, she could not get out of bed because she felt so weak, tired and dizzy.  She denied fevers, chills, cough, shortness of breath.  She denied exposure to COVID.Marland Kitchen  She was hypothermic when she arrived.  She was initially thought to have community-acquired pneumonia, possibly COVID pneumonia because of infiltrates seen on chest x-ray.  CT of the chest however showed no pneumonia, but likely interstitial lung disease which is chronic.  Her procalcitonin level was also very low.  Her COVID test was negative.  She did receive IV antibiotics x1 and fluids in the ED.  Upon discharge she felt much stronger.  She was evaluated by PT who did not feel she needed any home health.  Hypothermia: Resolved with bear hugger Cause of hypothermia, acute dizziness and malaise unclear No evidence of any acute infection from lab work or physical exam  Hypokalemia: Repleted Resolved Continued home meds of Lasix 40 mg and potassium 40 mEq  Hypertension: Controlled Continued home meds-diltiazem, Lasix 40 mg  COPD: No evidence of acute flare Continued montelukast, Dulera and theophylline Albuterol as needed  Paroxysmal atrial fibrillation: Sinus rhythm on EKG Continued amiodarone, diltiazem and Eliquis  Anxiety: Stable Continued on lorazepam at bedtime  Hypothyroidism: Continue with Synthroid TSH 5.7-had improved from 13.6  Chronic pain: Continued on gabapentin and methocarbamol   Headaches: Since the temporal arteritis and loss of her vision she is always had a headache over her right eye, but since her fall on 8/20 she has had frequent headaches.  She did have a CT scan in the ED at that time and there is no intracranial bleed.  She did have a scalp hematoma and has some tenderness in the posterior head where she hit her head.  She is concerned about the headaches, especially because that was associated with her loss of vision.  She has also been experiencing dizziness, which is a true spinning sensation.  This has been ongoing since the fall.  It occurs with changes in position and moving makes it worse.  She denies any new numbness, tingling or weakness in her arms or legs.  Her legs are weak at times, but this is not new.  She did discuss this with neurology and they did not feel this was anything concerning.  She is very anxious regarding the cause of the headaches and dizziness.  She has not been taking the Eliquis-after long discussion with cardiology she is decided to hold it.     Medications and allergies reviewed with patient and updated if appropriate.  Patient Active Problem List   Diagnosis Date Noted  . Community acquired pneumonia 02/05/2019  . Hypothermia 02/05/2019  . CAP (community acquired pneumonia) 02/05/2019  . Pruritus 01/02/2019  . Rib pain on left side 08/09/2018  . Hypokalemia 08/09/2018  . Long term (current) use of anticoagulants 08/01/2018  . Pre-operative clearance  08/01/2018  . Closed compression fracture of L4 vertebra ([place]) 07/11/2018  . Bilateral leg edema 07/11/2018  . Lung nodule < 6cm on CT 07/11/2018  . Subclinical hypothyroidism 04/11/2018  . Fracture of femoral neck, right ([place]) 03/22/2018  . PAF (paroxysmal atrial fibrillation) ([place]) 03/18/2018  . 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 Herpes zoster without complication [id number]  . Jaw claudication 07/27/2017  . Temporal arteritis ([place]) 07/18/2017  .  Vision, loss, sudden, bilateral 07/18/2017  . 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 Osteoarthritis 06/25/2016  . Anxiety 06/25/2016  . Back pain 07/18/2015  . 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 Carotid bruit present 06/05/2010  . Osteoporosis 06/03/2008  . History of colonic polyps 06/03/2008  . INTENTION TREMOR 12/06/2007  . IBS 10/16/2007  . Hyperlipidemia 06/06/2007  . Essential hypertension 06/06/2007  . Chronic obstructive airway disease with asthma ([place]) 02/23/2007    Current Outpatient Medications on File Prior to Visit  Medication Sig Dispense Refill  . albuterol (VENTOLIN HFA) 108 (90 Base) MCG/ACT inhaler INHALE 1 PUFF INTO THE LUNGS AS DIRECTED AS NEEDED 18 g 1  . alendronate (FOSAMAX) 70 MG tablet Take 70 mg by mouth every Monday.     Marland Kitchen amiodarone (PACERONE) 200 MG tablet Take 1 tablet (200 mg total) by mouth daily. 90 tablet 3  . Calcium Citrate-Vitamin D (CALCIUM + D PO) Take 1 tablet by mouth daily.    . Cholecalciferol (VITAMIN D3) 1000 units CAPS Take 1 capsule (1,000 Units total) by mouth daily with lunch. 60 capsule 0  . diltiazem (CARDIZEM CD) 120 MG 24 hr capsule TAKE ONE CAPSULE BY MOUTH DAILY 90 capsule 1  . ELIQUIS 5 MG TABS tablet TAKE 1 TABLET BY MOUTH TWO TIMES A DAY 180 tablet 1  . furosemide (LASIX) 40 MG tablet TAKE ONE TABLET BY MOUTH DAILY (Patient taking differently: Take 40 mg by mouth daily. ) 90 tablet 2  . gabapentin (NEURONTIN) 100 MG capsule Take 5 capsules (500 mg total) by mouth at bedtime. (Patient taking differently: Take 900 mg by mouth at bedtime. ) 30 capsule 0  . levothyroxine (SYNTHROID) 50 MCG tablet Take 1 tablet (50 mcg total) by mouth daily. 90 tablet 3  . LORazepam (ATIVAN) 1 MG tablet TAKE ONE TABLET BY MOUTH EVERY NIGHT AT BEDTIME 30 tablet 0  . methocarbamol (ROBAXIN) 500 MG tablet Take 1 tablet (500 mg total) by mouth every 6 (six) hours as needed for muscle spasms. 120 tablet 3  . montelukast (SINGULAIR) 10 MG tablet Take 1 tablet (10 mg total)  by mouth at bedtime. 90 tablet 1  . potassium chloride SA (K-DUR,KLOR-CON) 20 MEQ tablet TAKE TWO TABLETS BY MOUTH DAILY 180 tablet 0  . predniSONE (DELTASONE) 5 MG tablet Take 5 mg by mouth daily with breakfast.     . SYMBICORT 160-4.5 MCG/ACT inhaler INHALE ONE TO TWO PUFFS BY MOUTH EVERY 12 HOURS, THEN GARGLE AND SPIT AFTER USE 10.2 g 9  . theophylline (UNIPHYL) 400 MG 24 hr tablet TAKE 1 TABLET (400 MG TOTAL) BY MOUTH DAILY. (Patient taking differently: Take 200 mg by mouth 2 (two) times daily. ) 90 tablet 2  . traMADol (ULTRAM) 50 MG tablet Take 1 tablet (50 mg total) by mouth every 6 (six) hours as needed for moderate pain. 90 tablet 0  . triamcinolone cream (KENALOG) 0.1 % Apply 1 application topically 2 (two) times daily. 30 g 0   No current facility-administered medications on file prior to visit.     Past  Medical History:  Diagnosis Date  . Adenomatous colon polyp 2007 & 2010    Dr [name]  . Allergy    seasonal  . Arthritis   . Cataract   . Diverticulosis   . 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 Hyperlipidemia   . Hypertension   . IBS (irritable bowel syndrome)   . Legally blind   . MVP (mitral valve prolapse)   . Paroxysmal atrial fibrillation with rapid ventricular response ([place]) 08/29/2017  . Peripheral vascular disease ([place])   . RAD (reactive airway disease)     Past Surgical History:  Procedure Laterality Date  . ABDOMINAL HYSTERECTOMY     with USO for dysfunctionall menses  . APPENDECTOMY    . ARTERY BIOPSY Bilateral 07/21/2017   Procedure: BIOPSY BILATERAL TEMPORAL ARTERY;  Surgeon: [name], MD;  Location: [facility];  Service: Vascular;  Laterality: Bilateral;  . CARDIOVERSION N/A 06/16/2018   Procedure: CARDIOVERSION;  Surgeon: [name], MD;  Location: [facility];  Service: Cardiovascular;  Laterality: N/A;  . CATARACT EXTRACTION W/ INTRAOCULAR LENS IMPLANT  2013   bilateral; Dr [name]  . COLONOSCOPY  2013   negative , due  2018; Dr [name]  . COLONOSCOPY W/ POLYPECTOMY      X2 ; diverticulosis  . EMBOLECTOMY Right 08/29/2017   Procedure: Right Groin Exploration  ;  Surgeon: Rosetta Posner, MD;  Location: [facility];  Service: Vascular;  Laterality: Right;  . HIP PINNING,CANNULATED Right 03/21/2018  . HIP PINNING,CANNULATED Right 03/21/2018   Procedure: CANNULATED HIP PINNING;  Surgeon: Rod Can, MD;  Location: [facility];  Service: Orthopedics;  Laterality: Right;  . LUMBAR LAMINECTOMY  2000   Dr Vertell Limber  . SHOULDER SURGERY     R shoulder  . TONSILLECTOMY      Social History   Socioeconomic History  . Marital status: Married    Spouse name: Not on file  . Number of children: Not on file  . Years of education: Not on file  . Highest education level: Not on file  Occupational History  . Occupation: retired  Scientific laboratory technician  . Financial resource strain: Not on file  . Food insecurity    Worry: Not on file    Inability: Not on file  . Transportation needs    Medical: Not on file    Non-medical: Not on file  Tobacco Use  . Smoking status: Former Smoker    Packs/day: 1.00    Years: 23.00    Pack years: 23.00    Quit date: 06/14/1989    Years since quitting: 29.6  . Smokeless tobacco: Never Used  . Tobacco comment: smoked 1961-1991 , up to < 1 ppd  Substance and Sexual Activity  . Alcohol use: Yes    Alcohol/week: 14.0 standard drinks    Types: 14 Glasses of wine per week    Comment: Wine  . Drug use: No  . Sexual activity: Not on file  Lifestyle  . Physical activity    Days per week: Not on file    Minutes per session: Not on file  . Stress: Not on file  Relationships  . Social Herbalist on phone: Not on file    Gets together: Not on file    Attends religious service: Not on file    Active member of club or organization: Not on file    Attends meetings of clubs or organizations: Not on file    Relationship status: Not  on file  Other Topics Concern  . Not on file  Social History  Narrative  . Not on file    Family History  Problem Relation Age of Onset  . Heart attack Brother 1  . Parkinsonism Mother   . Stroke Mother   . Heart attack Father        pre 66  . Diabetes Sister   . COPD Sister        X 3  . Other Son        suicide 2016  . Colon cancer Neg Hx   . Cancer Neg Hx   . Esophageal cancer Neg Hx   . Liver cancer Neg Hx   . Pancreatic cancer Neg Hx   . Rectal cancer Neg Hx   . Stomach cancer Neg Hx     Review of Systems  Constitutional: Negative for chills, diaphoresis and fever.  HENT: Negative for ear pain, hearing loss and sinus pain.   Respiratory: Negative for cough, shortness of breath and wheezing.   Cardiovascular: Positive for leg swelling. Negative for chest pain and palpitations.  Neurological: Positive for dizziness and headaches (chronic headache over R eye, has had headaches in entire head since fall). Negative for weakness and numbness.       Objective:   Vitals:   02/09/19 1001  BP: (!) 172/80  Pulse: 86  Resp: 16  Temp: 98.5 F (36.9 C)  SpO2: 95%   BP Readings from Last 3 Encounters:  02/09/19 (!) 172/80  02/06/19 (!) 141/69  02/01/19 123/60   Wt Readings from Last 3 Encounters:  02/05/19 124 lb 1.6 oz (56.3 kg)  01/17/19 127 lb (57.6 kg)  01/02/19 127 lb (57.6 kg)   Body mass index is 21.3 kg/m.   Physical Exam    Constitutional: Appears well-developed and well-nourished. No distress.  HENT:  Head: Normocephalic.  Bruising posterior head, neck and upper back from recent fall.  Posterior head slightly tender with palpation-this is superficial tenderness. Neck: Neck supple. No tracheal deviation present. No thyromegaly present.  No cervical lymphadenopathy Ears: Normal ear canals and tympanic membranes bilaterally Cardiovascular: Normal rate, regular rhythm and normal heart sounds.   No murmur heard. No carotid bruit .  No edema Pulmonary/Chest: Effort normal and breath sounds normal. No respiratory  distress. No has no wheezes. No rales. Neurological: Sensation and strength normal in all extremities. Skin: Skin is warm and dry. Not diaphoretic.  Psychiatric: Normal mood and affect. Behavior is normal.   Lab Results  Component Value Date   WBC 6.4 02/06/2019   HGB 11.9 (L) 02/06/2019   HCT 36.5 02/06/2019   PLT 256 02/06/2019   GLUCOSE 62 (L) 02/06/2019   CHOL 312 (H) 01/02/2019   TRIG 128 02/05/2019   HDL 166.30 01/02/2019   LDLDIRECT 116.6 06/13/2013   LDLCALC 126 (H) 01/02/2019   ALT 34 02/05/2019   AST 71 (H) 02/05/2019   NA 135 02/06/2019   K 4.5 02/06/2019   CL 101 02/06/2019   CREATININE 0.72 02/06/2019   BUN 7 (L) 02/06/2019   CO2 23 02/06/2019   TSH 5.702 (H) 02/06/2019   INR 1.0 02/05/2019   HGBA1C 4.5 (L) 01/02/2019    CT Chest Wo Contrast CLINICAL DATA:  Weakness.  Evaluate pneumonia seen on chest x-ray  EXAM: CT CHEST WITHOUT CONTRAST  TECHNIQUE: Multidetector CT imaging of the chest was performed following the standard protocol without IV contrast.  COMPARISON:  Chest x-ray earlier today  FINDINGS: Cardiovascular:  Heart is normal size. Aorta is normal caliber. Scattered aortic atherosclerosis.  Mediastinum/Nodes: No mediastinal, hilar, or axillary adenopathy. Trachea and esophagus are unremarkable. Thyroid unremarkable.  Lungs/Pleura: Peripheral interstitial thickening and tree-in-bud nodular densities in both upper lobes and lower lobes, suspect chronic interstitial lung disease. Right middle lobe nodule measures 7 mm. Bibasilar scarring or atelectasis. No effusions.  Upper Abdomen: Fatty infiltration of the liver.  Musculoskeletal: Chest wall soft tissues are unremarkable. No acute bony abnormality.  IMPRESSION: Peripheral interstitial opacities and tree-in-bud nodular densities in the lungs bilaterally, most pronounced in the lower lobes and bases as seen on chest x-ray today. Favor chronic interstitial lung disease. No other  confluent opacities to suggest pneumonia.  7 mm right middle lobe nodule. Non-contrast chest CT at 6-12 months is recommended. If the nodule is stable at time of repeat CT, then future CT at 18-24 months (from today's scan) is considered optional for low-risk patients, but is recommended for high-risk patients. This recommendation follows the consensus statement: Guidelines for Management of Incidental Pulmonary Nodules Detected on CT Images: From the Fleischner Society 2017; Radiology 2017; 284:228-243.  Fatty infiltration of the liver  Aortic Atherosclerosis (ICD10-I70.0).  Electronically Signed   By: Rolm Baptise M.D.   On: 02/05/2019 18:28 DG Chest Port 1 View CLINICAL DATA:  Generalized weakness and dizziness  EXAM: PORTABLE CHEST 1 VIEW  COMPARISON:  January 31, 2019  FINDINGS: There is subtle ill-defined opacity in each lung base, concerning for bibasilar pneumonia. Lungs elsewhere clear. Heart size and pulmonary vascularity are normal. No adenopathy. There is aortic atherosclerosis. No bone lesions.  IMPRESSION: Subtle ill-defined opacity in each lung base, likely early pneumonia in each lung base. Lungs elsewhere clear. Stable cardiac silhouette. Aortic Atherosclerosis (ICD10-I70.0).  Electronically Signed   By: Lowella Grip III M.D.   On: 02/05/2019 11:18    Assessment & Plan:    See Problem List for Assessment and Plan of chronic medical problems.

## 2019-02-08 NOTE — Patient Instructions (Addendum)
  An MRI was ordered.  They will call you to schedule this.   Flu immunization administered today.    Medications reviewed and updated.  Changes include :  Increase lorazepam if needed.

## 2019-02-09 ENCOUNTER — Other Ambulatory Visit: Payer: Self-pay

## 2019-02-09 ENCOUNTER — Encounter: Payer: Self-pay | Admitting: Internal Medicine

## 2019-02-09 ENCOUNTER — Ambulatory Visit (INDEPENDENT_AMBULATORY_CARE_PROVIDER_SITE_OTHER): Payer: Medicare Other | Admitting: Internal Medicine

## 2019-02-09 VITALS — BP 172/80 | HR 86 | Temp 98.5°F | Resp 16 | Ht 64.0 in

## 2019-02-09 DIAGNOSIS — R519 Headache, unspecified: Secondary | ICD-10-CM | POA: Insufficient documentation

## 2019-02-09 DIAGNOSIS — R51 Headache: Secondary | ICD-10-CM | POA: Diagnosis not present

## 2019-02-09 DIAGNOSIS — H814 Vertigo of central origin: Secondary | ICD-10-CM

## 2019-02-09 DIAGNOSIS — Z23 Encounter for immunization: Secondary | ICD-10-CM | POA: Diagnosis not present

## 2019-02-09 DIAGNOSIS — H81399 Other peripheral vertigo, unspecified ear: Secondary | ICD-10-CM | POA: Diagnosis not present

## 2019-02-09 MED ORDER — LORAZEPAM 1 MG PO TABS: 1.0000 mg | ORAL_TABLET | Freq: Every day | ORAL | 0 refills | Status: DC

## 2019-02-09 NOTE — Assessment & Plan Note (Signed)
Vertigo may be BPPV, but may also be related to recent head trauma MRI of the brain ordered Takes lorazepam at night.  Advised that she can take half a pill during the day as needed If this is not effective we will prescribe meclizine Discussed that both can make her drowsy and increased risk of falls and to be very careful

## 2019-02-09 NOTE — Assessment & Plan Note (Signed)
Having increased headaches since 8/20 when she fell and hit her head Discussed that most likely she suffered a concussion and this could be related to that CT of the head on the day of the fall showed no intracranial bleed Just prior to the fall she had just stopped the blood thinner Given the persistent headache in addition to her chronic headaches we will go ahead and get an MRI of the brain

## 2019-02-09 NOTE — Assessment & Plan Note (Signed)
Vertigo sounds like possible BPPV, but she there may also be some vertigo related to hitting her head/possible concussion Given that she is blind and has had recurrent falls this is very concerning The vertigo is also causing a lot of anxiety Recent CT of the head unremarkable We will go ahead and get MRI of the brain

## 2019-02-10 LAB — CULTURE, BLOOD (ROUTINE X 2)
Culture: NO GROWTH
Culture: NO GROWTH
Special Requests: ADEQUATE

## 2019-02-20 ENCOUNTER — Other Ambulatory Visit: Payer: Self-pay | Admitting: Internal Medicine

## 2019-02-20 DIAGNOSIS — S46012A Strain of muscle(s) and tendon(s) of the rotator cuff of left shoulder, initial encounter: Secondary | ICD-10-CM | POA: Diagnosis not present

## 2019-02-22 ENCOUNTER — Telehealth: Payer: Self-pay

## 2019-02-22 DIAGNOSIS — H814 Vertigo of central origin: Secondary | ICD-10-CM

## 2019-02-22 NOTE — Telephone Encounter (Signed)
ordered

## 2019-02-22 NOTE — Telephone Encounter (Signed)
Copied from [place] [phone]. Topic: General - Other >> Feb 22, 2019  8:37 AM Carolyn Stare wrote: Pt call to say Dr [name] was going to set her up for therapy for her dizzness and she call today she would like to have that set up for her, req a call back

## 2019-02-22 NOTE — Telephone Encounter (Signed)
Husband is aware.

## 2019-02-24 ENCOUNTER — Other Ambulatory Visit: Payer: Self-pay | Admitting: Cardiology

## 2019-03-01 ENCOUNTER — Other Ambulatory Visit: Payer: Self-pay | Admitting: Internal Medicine

## 2019-03-02 NOTE — Telephone Encounter (Signed)
[place] Controlled Database Checked Last filled: 01/29/19 #30 LOV w/you: 02/09/19 Next appt w/you: 07/04/18

## 2019-03-04 ENCOUNTER — Ambulatory Visit
Admission: RE | Admit: 2019-03-04 | Discharge: 2019-03-04 | Disposition: A | Discharge status: Home / Self Care | Payer: Medicare Other | Source: Ambulatory Visit | Attending: Internal Medicine | Admitting: Internal Medicine

## 2019-03-04 ENCOUNTER — Other Ambulatory Visit: Payer: Self-pay

## 2019-03-04 DIAGNOSIS — R519 Headache, unspecified: Secondary | ICD-10-CM

## 2019-03-04 DIAGNOSIS — H814 Vertigo of central origin: Secondary | ICD-10-CM

## 2019-03-04 DIAGNOSIS — H81399 Other peripheral vertigo, unspecified ear: Secondary | ICD-10-CM

## 2019-03-04 DIAGNOSIS — R296 Repeated falls: Secondary | ICD-10-CM | POA: Diagnosis not present

## 2019-03-04 DIAGNOSIS — R42 Dizziness and giddiness: Secondary | ICD-10-CM | POA: Diagnosis not present

## 2019-03-04 DIAGNOSIS — R51 Headache: Secondary | ICD-10-CM | POA: Diagnosis not present

## 2019-03-04 MED ORDER — GADOBENATE DIMEGLUMINE 529 MG/ML IV SOLN
11.0000 mL | Freq: Once | INTRAVENOUS | Status: AC | PRN
  Administered 2019-03-04: 11 mL via INTRAVENOUS

## 2019-03-08 ENCOUNTER — Other Ambulatory Visit: Payer: Self-pay | Admitting: Cardiology

## 2019-03-09 ENCOUNTER — Other Ambulatory Visit: Payer: Medicare Other

## 2019-03-10 ENCOUNTER — Other Ambulatory Visit: Payer: Self-pay | Admitting: Internal Medicine

## 2019-03-11 ENCOUNTER — Other Ambulatory Visit: Payer: Self-pay | Admitting: Cardiology

## 2019-03-12 ENCOUNTER — Other Ambulatory Visit: Payer: Self-pay

## 2019-03-12 ENCOUNTER — Ambulatory Visit: Payer: Medicare Other | Attending: Internal Medicine | Admitting: Physical Therapy

## 2019-03-12 ENCOUNTER — Encounter: Payer: Self-pay | Admitting: Physical Therapy

## 2019-03-12 DIAGNOSIS — H8112 Benign paroxysmal vertigo, left ear: Secondary | ICD-10-CM | POA: Diagnosis not present

## 2019-03-12 DIAGNOSIS — H8111 Benign paroxysmal vertigo, right ear: Secondary | ICD-10-CM | POA: Insufficient documentation

## 2019-03-12 DIAGNOSIS — R2681 Unsteadiness on feet: Secondary | ICD-10-CM | POA: Diagnosis not present

## 2019-03-12 DIAGNOSIS — R296 Repeated falls: Secondary | ICD-10-CM

## 2019-03-12 DIAGNOSIS — R262 Difficulty in walking, not elsewhere classified: Secondary | ICD-10-CM

## 2019-03-12 DIAGNOSIS — R42 Dizziness and giddiness: Secondary | ICD-10-CM | POA: Diagnosis not present

## 2019-03-13 ENCOUNTER — Other Ambulatory Visit: Payer: Self-pay

## 2019-03-13 MED ORDER — DILTIAZEM HCL ER COATED BEADS 120 MG PO CP24: 120.0000 mg | ORAL_CAPSULE | Freq: Every day | ORAL | 1 refills | Status: DC

## 2019-03-13 NOTE — Patient Instructions (Signed)
Provided hand out for Forced Prolonged Positioning on L side with pillows behind back to allow pt to lie slightly off L shoulder

## 2019-03-13 NOTE — Therapy (Signed)
[facility] [address] [place], [place], [address] Phone: [phone]   Fax:  [phone]  Physical Therapy Evaluation  Patient Details  Name: Crystal [name] MRN: [id number] Date of Birth: [birth year] Referring Provider (PT): [name], MD   Encounter Date: 03/12/2019  PT End of Session - 03/13/19 1522    Visit Number  1    Number of Visits  9    Date for PT Re-Evaluation  04/12/19    Authorization Type  BCBS Medicare; $40 copay.  10th visit PN.  No VL    PT Start Time  1210    PT Stop Time  1256    PT Time Calculation (min)  46 min    Activity Tolerance  Patient tolerated treatment well    Behavior During Therapy  Anxious       Past Medical History:  Diagnosis Date  . Adenomatous colon polyp 2007 & 2010    Dr [name]  . Allergy    seasonal  . Arthritis   . Cataract   . Diverticulosis   . 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 Hyperlipidemia   . Hypertension   . IBS (irritable bowel syndrome)   . Legally blind   . MVP (mitral valve prolapse)   . Paroxysmal atrial fibrillation with rapid ventricular response ([place]) 08/29/2017  . Peripheral vascular disease ([place])   . RAD (reactive airway disease)     Past Surgical History:  Procedure Laterality Date  . ABDOMINAL HYSTERECTOMY     with USO for dysfunctionall menses  . APPENDECTOMY    . ARTERY BIOPSY Bilateral 07/21/2017   Procedure: BIOPSY BILATERAL TEMPORAL ARTERY;  Surgeon: [name], MD;  Location: [facility];  Service: Vascular;  Laterality: Bilateral;  . CARDIOVERSION N/A 06/16/2018   Procedure: CARDIOVERSION;  Surgeon: [name], MD;  Location: [facility];  Service: Cardiovascular;  Laterality: N/A;  . CATARACT EXTRACTION W/ INTRAOCULAR LENS IMPLANT  2013   bilateral; Dr [name]  . COLONOSCOPY  2013   negative , due 2018; Dr [name]  . COLONOSCOPY W/ POLYPECTOMY      X2 ; diverticulosis  . EMBOLECTOMY Right 08/29/2017   Procedure: Right Groin Exploration  ;  Surgeon: Rosetta Posner, MD;  Location: [facility];  Service: Vascular;  Laterality: Right;  . HIP PINNING,CANNULATED Right 03/21/2018  . HIP PINNING,CANNULATED Right 03/21/2018   Procedure: CANNULATED HIP PINNING;  Surgeon: Rod Can, MD;  Location: [facility];  Service: Orthopedics;  Laterality: Right;  . LUMBAR LAMINECTOMY  2000   Dr Vertell Limber  . SHOULDER SURGERY     R shoulder  . TONSILLECTOMY      There were no vitals filed for this visit.   Subjective Assessment - 03/12/19 1217    Subjective  Fell in August in garage, hit back of her head.  Went to ER and imaging did not show any acute changes.  A few days later she developed significant dizziness and had a hard time getting out of bed.  Went to PCP and had an MRI with no acute findings.  Referred to PT to assess.    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  3      [place] residence    Living Arrangements  Spouse/significant other    Type of [place] to enter    Home Layout  Two level;Able to live on main level with bedroom/bathroom    [place] - 2 wheels;Other (comment)      Prior Function   Level of Independence  Independent with household mobility with device;Independent with basic ADLs;Requires assistive device for independence;Independent with transfers      Observation/Other Assessments   Focus on Therapeutic Outcomes (FOTO)   N/A      ROM / Strength   AROM / PROM / Strength  AROM      AROM   Overall AROM   Within functional limits for tasks performed    Overall AROM Comments  gross assessment of cervical spine AROM      Ambulation/Gait   Ambulation/Gait  Yes    Ambulation/Gait Assistance  4: Min assist    Ambulation/Gait Assistance Details  walks with husband's HHA and white cane.  Ambulates very slowly and cautiously.  Prior to fall pt reports ambulating with cane MOD I    Ambulation Distance (Feet)  50 Feet    Assistive device  Other (Comment)   white cane   Gait Pattern  Step-through pattern;Decreased step length - right;Decreased step length - left;Decreased stride length    Ambulation Surface  Level;Indoor           Vestibular Assessment - 03/12/19 1225      Vestibular Assessment   General Observation  pt ambulates very slowly and cautiously, holding husband with one hand      Symptom Behavior   Subjective history of current problem  intermittent ringing in ears, pt is totally blind, h/o headaches.  Reports intermittent nausea but no vomiting    Type of Dizziness   Spinning    Frequency of Dizziness  daily    Duration of Dizziness  2-3 minutes    Symptom Nature  Motion provoked;Positional    Aggravating Factors  Lying supine;Sit to stand;Supine to sit;Rolling to right;Rolling to left;Looking up to the ceiling    Relieving Factors  Head  stationary    Progression of Symptoms  No change since onset    History of similar episodes  none      Oculomotor Exam   Spontaneous  Right beating nystagmus    Comment  unable to assess as pt is completely blind      Oculomotor Exam-Fixation Suppressed  Head Shaking Nystagmus-Horizontal  unable to assess as pt is completely blind      Vestibulo-Ocular Reflex   Comment  unable to assess as pt is completely blind      Positional Testing   Dix-Hallpike  Dix-Hallpike Right;Dix-Hallpike Left    Horizontal Canal Testing  Horizontal Canal Right;Horizontal Canal Left      Dix-Hallpike Right   Dix-Hallpike Right Duration  30 seconds    Dix-Hallpike Right Symptoms  Other (comment)   R rotary nystagmus changing to R beating persistent     Dix-Hallpike Left   Dix-Hallpike Left Duration  unable to test today      Horizontal Canal Right   Horizontal Canal Right Duration  30 seconds    Horizontal Canal Right Symptoms  Geotrophic      Horizontal Canal Left   Horizontal Canal Left Duration  30 seconds    Horizontal Canal Left Symptoms  Geotrophic          Objective measurements completed on examination: See above findings.       Vestibular Treatment/Exercise - 03/13/19 1522      Vestibular Treatment/Exercise   Vestibular Treatment Provided  Canalith Repositioning    Canalith Repositioning  Canal Roll Right      Canal Roll Right   Number of Reps   1    Overall Response   --   unknown   Response Details   unable to re test today            PT Education - 03/13/19 1522    Education Details  clinical findings, PT POC and goals, forced prolonged positioning for horizontal canal    Person(s) Educated  Patient;Spouse    Methods  Explanation    Comprehension  Verbalized understanding          PT Long Term Goals - 03/13/19 1533      PT LONG TERM GOAL #1   Title  Pt will demonstrate ability to perform final HEP with supervision    Time  4    Period  Weeks     Status  New    Target Date  04/12/19      PT LONG TERM GOAL #2   Title  Pt will demonstrate negative positional testing for multiple canals    Time  4    Period  Weeks    Status  New    Target Date  04/12/19      PT LONG TERM GOAL #3   Title  Pt will report no spinning when performing rolling in bed and during supine <> sit    Time  4    Period  Weeks    Status  New    Target Date  04/12/19      PT LONG TERM GOAL #4   Title  Pt will demonstrate ability to ambulate x 115' over indoor surfaces with white cane and negotiate 8 stairs with rails and supervision    Time  4    Period  Weeks    Status  New    Target Date  04/12/19             Plan - 03/13/19 1529    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, IBS, HTN, HLD, femoral artery thrombosis, diverticulosis, cataract, OA, R shoulder surgery, lumbar laminectomy, R  cannulated hip surgery, and embolectomy    Examination-Activity Limitations  Bed Mobility;Locomotion Level;Stand;Transfers;Stairs    Examination-Participation Restrictions  Shop;Community Activity    Stability/Clinical Decision Making  Evolving/Moderate complexity    Clinical Decision Making  Moderate    Rehab Potential  Good    PT Frequency  2x / week    PT Duration  4 weeks    PT Treatment/Interventions  ADLs/Self Care Home Management;Canalith Repostioning;Gait training;Stair training;Functional mobility training;Therapeutic activities;Therapeutic exercise;Balance training;Neuromuscular re-education;Patient/family education;Vestibular    PT Next Visit Plan  PT IS COMPLETELY VISUALLY IMPAIRED - USES WHITE CANE.  How was forced prolonged positioning? try to assess spontaneous nystagmus. Re-assess R horizontal canal and R posterior canal.  treat as indicated.  Balance exercises as  indicated.    Consulted and Agree with Plan of Care  Patient;Family member/caregiver    Family Member Consulted  Husband       Patient will benefit from skilled therapeutic intervention in order to improve the following deficits and impairments:  Decreased balance, Difficulty walking, Dizziness  Visit Diagnosis: BPPV (benign paroxysmal positional vertigo), right  Dizziness and giddiness  Unsteadiness on feet  Repeated falls  Difficulty in walking, not elsewhere classified     Problem List Patient Active Problem List   Diagnosis Date Noted  . Vertigo of central origin 02/09/2019  . Other peripheral vertigo, unspecified ear 02/09/2019  . Nonintractable headache 02/09/2019  . Community acquired pneumonia 02/05/2019  . Hypothermia 02/05/2019  . CAP (community acquired pneumonia) 02/05/2019  . Pruritus 01/02/2019  . Rib pain on left side 08/09/2018  . Hypokalemia 08/09/2018  . Long term (current) use of anticoagulants 08/01/2018  . Pre-operative clearance 08/01/2018  . Closed compression fracture of L4 vertebra ([place]) 07/11/2018  . Bilateral leg edema 07/11/2018  . Lung nodule < 6cm on CT 07/11/2018  . Subclinical hypothyroidism 04/11/2018  . Fracture of femoral neck, right ([place]) 03/22/2018  . PAF (paroxysmal atrial fibrillation) ([place]) 03/18/2018  . 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 Herpes zoster without complication [id number]  . Jaw claudication 07/27/2017  . Temporal arteritis ([place]) 07/18/2017  . Vision, loss, sudden, bilateral 07/18/2017  . 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 Osteoarthritis 06/25/2016  . Anxiety 06/25/2016  . Back pain 07/18/2015  . 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 Carotid bruit present 06/05/2010  . Osteoporosis 06/03/2008  . History of colonic polyps 06/03/2008  . INTENTION TREMOR 12/06/2007  . IBS 10/16/2007  . Hyperlipidemia 06/06/2007  . Essential hypertension 06/06/2007  . Chronic obstructive airway disease with asthma ([place])  02/23/2007    Rico Junker, PT, DPT 03/13/19    3:37 PM    [facility] [address] [place], [place], [address] Phone: [phone]   Fax:  [phone]  Name: RHAE AUSLANDER MRN: [id number] Date of Birth: September 04, 1945

## 2019-03-14 ENCOUNTER — Ambulatory Visit: Payer: Medicare Other | Admitting: Physical Therapy

## 2019-03-14 ENCOUNTER — Encounter: Payer: Self-pay | Admitting: Physical Therapy

## 2019-03-14 ENCOUNTER — Other Ambulatory Visit: Payer: Self-pay

## 2019-03-14 DIAGNOSIS — R2681 Unsteadiness on feet: Secondary | ICD-10-CM | POA: Diagnosis not present

## 2019-03-14 DIAGNOSIS — R262 Difficulty in walking, not elsewhere classified: Secondary | ICD-10-CM | POA: Diagnosis not present

## 2019-03-14 DIAGNOSIS — H8112 Benign paroxysmal vertigo, left ear: Secondary | ICD-10-CM | POA: Diagnosis not present

## 2019-03-14 DIAGNOSIS — H8111 Benign paroxysmal vertigo, right ear: Secondary | ICD-10-CM

## 2019-03-14 DIAGNOSIS — R42 Dizziness and giddiness: Secondary | ICD-10-CM

## 2019-03-14 DIAGNOSIS — R296 Repeated falls: Secondary | ICD-10-CM | POA: Diagnosis not present

## 2019-03-14 NOTE — Therapy (Signed)
[facility] [address] [place], [place], [address] Phone: [phone]   Fax:  [phone]  Physical Therapy Treatment  Patient Details  Name: Crystal [name] MRN: [id number] Date of Birth: 28-Dec-1945 Referring Provider (PT): [name], MD   Encounter Date: 03/14/2019  PT End of Session - 03/14/19 1423    Visit Number  2    Number of Visits  9    Date for PT Re-Evaluation  04/12/19    Authorization Type  BCBS Medicare; $40 copay.  10th visit PN.  No VL    PT Start Time  1240    PT Stop Time  1320    PT Time Calculation (min)  40 min    Activity Tolerance  Patient tolerated treatment well    Behavior During Therapy  Anxious       Past Medical History:  Diagnosis Date  . Adenomatous colon polyp 2007 & 2010    Dr [name]  . Allergy    seasonal  . Arthritis   . Cataract   . Diverticulosis   . Femoral artery thrombosis, right ([facility]) 09/01/2017  . Hyperlipidemia   . Hypertension   . IBS (irritable bowel syndrome)   . Legally blind   . MVP (mitral valve prolapse)   . Paroxysmal atrial fibrillation with rapid ventricular response ([facility]) 08/29/2017  . Peripheral vascular disease ([place])   . RAD (reactive airway disease)     Past Surgical History:  Procedure Laterality Date  . ABDOMINAL HYSTERECTOMY     with USO for dysfunctionall menses  . APPENDECTOMY    . ARTERY BIOPSY Bilateral 07/21/2017   Procedure: BIOPSY BILATERAL TEMPORAL ARTERY;  Surgeon: [name], MD;  Location: [facility];  Service: Vascular;  Laterality: Bilateral;  . CARDIOVERSION N/A 06/16/2018   Procedure: CARDIOVERSION;  Surgeon: [name], MD;  Location: [facility];  Service: Cardiovascular;  Laterality: N/A;  . CATARACT EXTRACTION W/ INTRAOCULAR LENS IMPLANT  2013   bilateral; Dr [name]  . COLONOSCOPY  2013   negative , due 2018; Dr [name]  . COLONOSCOPY W/ POLYPECTOMY      X2 ; diverticulosis  . EMBOLECTOMY Right 08/29/2017    Procedure: Right Groin Exploration  ;  Surgeon: Rosetta Posner, MD;  Location: [facility];  Service: Vascular;  Laterality: Right;  . HIP PINNING,CANNULATED Right 03/21/2018  . HIP PINNING,CANNULATED Right 03/21/2018   Procedure: CANNULATED HIP PINNING;  Surgeon: Rod Can, MD;  Location: [facility];  Service: Orthopedics;  Laterality: Right;  . LUMBAR LAMINECTOMY  2000   Dr Vertell Limber  . SHOULDER SURGERY     R shoulder  . TONSILLECTOMY      There were no vitals filed for this visit.  Subjective Assessment - 03/14/19 1242    Subjective  Still feeling off balance when walking.  Was really dizzy yesterday, a little less today.  Was able to sleep in her L side.  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  No/denies             Vestibular  Assessment - 03/14/19 1244      Vestibular Assessment   General Observation  increased trembling and tremor today - reports this started a couple of months ago.  Some days it is so bad her legs tremor and she falls      Oculomotor Exam   Oculomotor Alignment  Abnormal   head tilt back and to the R, R eye exophoria at rest   Spontaneous  Down beating nystagmus    Gaze-induced   Right beating nystagmus with R gaze;Comment   down beating with down gaze   Head shaking Horizontal  Comment   eye drift up and to the R, downbeating     Horizontal Canal Right   Horizontal Canal Right Duration  1 second    Horizontal Canal Right Symptoms  Geotrophic      Horizontal Canal Left   Horizontal Canal Left Duration  15 seconds   more symptomatic to L side  today   Horizontal Canal Left Symptoms  Geotrophic                Vestibular Treatment/Exercise - 03/14/19 1301      Vestibular Treatment/Exercise   Vestibular Treatment Provided  Canalith Repositioning    Canalith Repositioning  Canal Roll Left      Canal Roll Left   Number of Reps   2    Overall Response   Improved Symptoms    Response Details   Also performed one deep head hanging maneuver to assess downbeating nystagmus.  Vertigo reported for 1-2 seconds with deep head hang and then when sitting back upright at end of manuever            PT Education - 03/14/19 1422    Education Details  improvement in symptoms during positional testing today; possible bilat BPPV, no need to perform prolonged positioning tonight    Person(s) Educated  Patient;Spouse    Methods  Explanation    Comprehension  Verbalized understanding          PT Long Term Goals - 03/13/19 1533      PT LONG TERM GOAL #1   Title  Pt will demonstrate ability to perform final HEP with supervision    Time  4    Period  Weeks    Status  New    Target Date  04/12/19      PT LONG TERM GOAL #2   Title  Pt will demonstrate negative positional testing for multiple canals    Time  4    Period  Weeks    Status  New    Target Date  04/12/19      PT LONG TERM GOAL #3   Title  Pt will report no spinning when performing rolling in bed and during supine <> sit    Time  4    Period  Weeks    Status  New    Target Date  04/12/19      PT LONG TERM GOAL #4   Title  Pt will demonstrate ability to ambulate x 115' over indoor surfaces with white cane and negotiate 8 stairs with rails and supervision    Time  4    Period  Weeks    Status  New    Target Date  04/12/19            Plan - 03/14/19 1424    Clinical Impression Statement  Pt returns with ongoing spinning vertigo and nystagmus during horizontal roll test,  with symptoms worse to L than R today indicating possible bilat involvement, no  symptoms when testing R horizontal canal today.  Performed BBQ roll for L side today with resolution of symptoms on L side.  Due to ongoing downbeat nystagmus performed deep head hanging to see if down beat correlated with an affected canal; no vertigo during deep head hanging and no downbeat nystagmus noted.  Downbeat observed may indicate central involvement.  Pt does present with spontaneous downbeat nystagmus today, and R beating with R gaze.  Will continue to assess and address as indicated.    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, IBS, HTN, HLD, femoral artery thrombosis, diverticulosis, cataract, OA, R shoulder surgery, lumbar laminectomy, R cannulated hip surgery, and embolectomy    Examination-Activity Limitations  Bed Mobility;Locomotion Level;Stand;Transfers;Stairs    Examination-Participation Restrictions  Shop;Community Activity    Stability/Clinical Decision Making  Evolving/Moderate complexity    Rehab Potential  Good    PT Frequency  2x / week    PT Duration  4 weeks    PT Treatment/Interventions  ADLs/Self Care Home Management;Canalith Repostioning;Gait training;Stair training;Functional mobility training;Therapeutic activities;Therapeutic exercise;Balance training;Neuromuscular re-education;Patient/family education;Vestibular    PT Next Visit Plan  PT IS COMPLETELY VISUALLY IMPAIRED - USES WHITE CANE.  Assess if horizontal canal has resolved; check posterior canals.  Balance exercises as indicated.    Consulted and Agree with Plan of Care  Patient;Family member/caregiver    Family Member Consulted  Husband       Patient will benefit from skilled therapeutic intervention in order to improve the following deficits and impairments:  Decreased balance, Difficulty walking, Dizziness  Visit  Diagnosis: BPPV (benign paroxysmal positional vertigo), left  BPPV (benign paroxysmal positional vertigo), right  Dizziness and giddiness  Unsteadiness on feet  Repeated falls  Difficulty in walking, not elsewhere classified     Problem List Patient Active Problem List   Diagnosis Date Noted  . Vertigo of central origin 02/09/2019  . Other peripheral vertigo, unspecified ear 02/09/2019  . Nonintractable headache 02/09/2019  . Community acquired pneumonia 02/05/2019  . Hypothermia 02/05/2019  . CAP (community acquired pneumonia) 02/05/2019  . Pruritus 01/02/2019  . Rib pain on left side 08/09/2018  . Hypokalemia 08/09/2018  . Long term (current) use of anticoagulants 08/01/2018  . Pre-operative clearance 08/01/2018  . Closed compression fracture of L4 vertebra ([place]) 07/11/2018  . Bilateral leg edema 07/11/2018  . Lung nodule < 6cm on CT 07/11/2018  . Subclinical hypothyroidism 04/11/2018  . Fracture of femoral neck, right ([facility]) 03/22/2018  . PAF (paroxysmal atrial fibrillation) ([facility]) 03/18/2018  . 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 Herpes zoster without complication [id number]  . Jaw claudication 07/27/2017  . Temporal arteritis ([place]) 07/18/2017  . Vision, loss, sudden, bilateral 07/18/2017  . 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 Osteoarthritis 06/25/2016  . Anxiety 06/25/2016  . Back pain 07/18/2015  . 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 Carotid bruit present 06/05/2010  . Osteoporosis 06/03/2008  . History of colonic polyps 06/03/2008  . INTENTION TREMOR 12/06/2007  . IBS 10/16/2007  . Hyperlipidemia 06/06/2007  . Essential hypertension 06/06/2007  . Chronic obstructive airway disease with asthma ([place]) 02/23/2007    Rico Junker, PT, DPT 03/14/19    2:30 PM    [facility] [address] [place], [place],  [id number] Phone: [phone]   Fax:  [phone]  Name: Crystal [name] MRN:  [id number] Date of Birth: 1946-04-25

## 2019-03-14 NOTE — Addendum Note (Signed)
Addended by: Rico Junker on: 03/14/2019 02:39 PM   Modules accepted: Orders

## 2019-03-16 ENCOUNTER — Other Ambulatory Visit: Payer: Self-pay | Admitting: Internal Medicine

## 2019-03-23 ENCOUNTER — Encounter: Payer: Self-pay | Admitting: Physical Therapy

## 2019-03-23 ENCOUNTER — Ambulatory Visit: Payer: Medicare Other | Attending: Internal Medicine | Admitting: Physical Therapy

## 2019-03-23 ENCOUNTER — Other Ambulatory Visit: Payer: Self-pay

## 2019-03-23 DIAGNOSIS — H8112 Benign paroxysmal vertigo, left ear: Secondary | ICD-10-CM

## 2019-03-23 DIAGNOSIS — R296 Repeated falls: Secondary | ICD-10-CM

## 2019-03-23 DIAGNOSIS — R2681 Unsteadiness on feet: Secondary | ICD-10-CM

## 2019-03-23 DIAGNOSIS — R42 Dizziness and giddiness: Secondary | ICD-10-CM

## 2019-03-23 DIAGNOSIS — H8111 Benign paroxysmal vertigo, right ear: Secondary | ICD-10-CM | POA: Diagnosis not present

## 2019-03-23 DIAGNOSIS — R262 Difficulty in walking, not elsewhere classified: Secondary | ICD-10-CM

## 2019-03-23 NOTE — Patient Instructions (Addendum)
Habituation - Tip Card  1.The goal of habituation training is to assist in decreasing symptoms of vertigo, dizziness, or nausea provoked by specific head and body motions. 2.These exercises may initially increase symptoms; however, be persistent and work through symptoms. With repetition and time, the exercises will assist in reducing or eliminating symptoms. 3.Exercises should be stopped and discussed with the therapist if you experience any of the following: - Sudden change or fluctuation in hearing - New onset of ringing in the ears, or increase in current intensity - Any fluid discharge from the ear - Severe pain in neck or back - Extreme nausea  Copyright  VHI. All rights reserved.   Habituation - Rolling   With pillow under head, start on back. Roll to your right side.  Hold until dizziness stops, plus 20 seconds and then roll to the left side.  Hold until dizziness stops, plus 20 seconds.  Repeat sequence 5 times per session. Do 2 sessions per day.   Walking: Phase 1    Stand next to your counter, one hand on the counter.  Walk forwards slowly taking a full step with each foot, try to keep your balance in the middle. Repeat 4 times.    Feet Apart, Head Motion - Eyes Closed   With eyes closed and feet shoulder width apart, chair in front for support if needed.  Move head slowly, up and down 10 times, side to side 10 times. Repeat 2 times per session. Do 2 sessions per day.

## 2019-03-24 NOTE — Therapy (Signed)
[facility] [address] [place], [place], [address] Phone: [phone]   Fax:  [phone]  Physical Therapy Treatment  Patient Details  Name: Crystal [name] MRN: [id number] Date of Birth: [birth year] Referring Provider (PT): [name], MD   Encounter Date: 03/23/2019  PT End of Session - 03/24/19 0908    Visit Number  3    Number of Visits  9    Date for PT Re-Evaluation  04/12/19    Authorization Type  BC[employer] Medicare; $40 copay.  10th visit PN.  No VL    PT Start Time  1445    PT Stop Time  1530    PT Time Calculation (min)  45 min    Activity Tolerance  Patient tolerated treatment well    Behavior During Therapy  WFL for tasks assessed/performed       Past Medical History:  Diagnosis Date  . Adenomatous colon polyp 2007 & 2010    Dr [name]  . Allergy    seasonal  . Arthritis   . Cataract   . Diverticulosis   . 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 Hyperlipidemia   . Hypertension   . IBS (irritable bowel syndrome)   . Legally blind   . MVP (mitral valve prolapse)   . Paroxysmal atrial fibrillation with rapid ventricular response ([place]) 08/29/2017  . Peripheral vascular disease ([place])   . RAD (reactive airway disease)     Past Surgical History:  Procedure Laterality Date  . ABDOMINAL HYSTERECTOMY     with USO for dysfunctionall menses  . APPENDECTOMY    . ARTERY BIOPSY Bilateral 07/21/2017   Procedure: BIOPSY BILATERAL TEMPORAL ARTERY;  Surgeon: [name], MD;  Location: [facility];  Service: Vascular;  Laterality: Bilateral;  . CARDIOVERSION N/A 06/16/2018   Procedure: CARDIOVERSION;  Surgeon: [name], MD;  Location: [facility];  Service: Cardiovascular;  Laterality: N/A;  . CATARACT EXTRACTION W/ INTRAOCULAR LENS IMPLANT  2013   bilateral; Dr [name]  . COLONOSCOPY  2013   negative , due 2018; Dr [name]  . COLONOSCOPY W/ POLYPECTOMY      X2 ; diverticulosis  .  EMBOLECTOMY Right 08/29/2017   Procedure: Right Groin Exploration  ;  Surgeon: Rosetta Posner, MD;  Location: [facility];  Service: Vascular;  Laterality: Right;  . HIP PINNING,CANNULATED Right 03/21/2018  . HIP PINNING,CANNULATED Right 03/21/2018   Procedure: CANNULATED HIP PINNING;  Surgeon: Rod Can, MD;  Location: [facility];  Service: Orthopedics;  Laterality: Right;  . LUMBAR LAMINECTOMY  2000   Dr Vertell Limber  . SHOULDER SURGERY     R shoulder  . TONSILLECTOMY      There were no vitals filed for this visit.  Subjective Assessment - 03/23/19 1452    Subjective  Having more headaches.  Pt is better overall, dizziness is better but still feels it in bed.  Has had headaches since arteritis but seem more constant this week.    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  Yes    Pain Location  Head  Vestibular Assessment - 03/23/19 1454      Positional Testing   Dix-Hallpike  Dix-Hallpike Left    Horizontal Canal Testing  Horizontal Canal Right;Horizontal Canal Left      Dix-Hallpike Left   Dix-Hallpike Left Duration  brief sense of "falling into a void"    Dix-Hallpike Left Symptoms  Other (comment)   left beating > downbeating > 1 minute     Horizontal Canal Right   Horizontal Canal Right Duration  1 minute    Horizontal Canal Right Symptoms  Geotrophic   less symptomatic to L side today     Horizontal Canal Left   Horizontal Canal Left Duration  30 seconds    Horizontal Canal Left Symptoms  Geotrophic   more symptomatic to R today     Positional Sensitivities   Nose to Right Knee  No dizziness     Right Knee to Sitting  Moderate dizziness    Nose to Left Knee  No dizziness    Left Knee to Sitting  Moderate dizziness    Head Turning x 5  Mild dizziness    Head Nodding x 5  Lightheadedness    Pivot Right in Standing  Lightheadedness    Pivot Left in Standing  Lightheadedness                Vestibular Treatment/Exercise - 03/23/19 1503      Vestibular Treatment/Exercise   Vestibular Treatment Provided  Habituation    Canalith Repositioning  --    Habituation Exercises  Horizontal Roll      Horizontal Roll   Number of Reps   4    Symptom Description   decreased intensity by final roll         Balance Exercises - 03/24/19 0905      Balance Exercises: Standing   Standing Eyes Opened  Narrow base of support (BOS);Head turns;Solid surface;Other reps (comment)   10 reps; pt is blind   Other Standing Exercises  Walking along counter top with one UE support focusing on increased stance time, step and stride length and use of UE proprioception on counter for body position; pt consistently veering towards counter and unable to self monitor and self correct        PT Education - 03/24/19 0907    Education Details  habituation and balance exercises to perform at home    Person(s) Educated  Patient;Spouse    Methods  Explanation;Demonstration;Tactile cues;Verbal cues;Handout    Comprehension  Verbalized understanding;Returned demonstration      Habituation - Tip Card  1.The goal of habituation training is to assist in decreasing symptoms of vertigo, dizziness, or nausea provoked by specific head and body motions. 2.These exercises may initially increase symptoms; however, be persistent and work through symptoms. With repetition and time, the exercises will assist in reducing or eliminating symptoms. 3.Exercises should be stopped and discussed with the therapist if you experience any of the following: - Sudden change or fluctuation in hearing - New onset of ringing in  the ears, or increase in current intensity - Any fluid discharge from the ear - Severe pain in neck or back - Extreme nausea  Copyright  VHI. All rights reserved.   Habituation - Rolling   With pillow under head, start on back. Roll to your right side.  Hold until dizziness stops, plus 20 seconds and then roll to the left side.  Hold until dizziness stops, plus 20 seconds.  Repeat sequence 5 times per session. Do  2 sessions per day.   Walking: Phase 1    Stand next to your counter, one hand on the counter.  Walk forwards slowly taking a full step with each foot, try to keep your balance in the middle. Repeat 4 times.    Feet Apart, Head Motion - Eyes Closed   With eyes closed and feet shoulder width apart, chair in front for support if needed.  Move head slowly, up and down 10 times, side to side 10 times. Repeat 2 times per session. Do 2 sessions per day.           PT Long Term Goals - 03/13/19 1533      PT LONG TERM GOAL #1   Title  Pt will demonstrate ability to perform final HEP with supervision    Time  4    Period  Weeks    Status  New    Target Date  04/12/19      PT LONG TERM GOAL #2   Title  Pt will demonstrate negative positional testing for multiple canals    Time  4    Period  Weeks    Status  New    Target Date  04/12/19      PT LONG TERM GOAL #3   Title  Pt will report no spinning when performing rolling in bed and during supine <> sit    Time  4    Period  Weeks    Status  New    Target Date  04/12/19      PT LONG TERM GOAL #4   Title  Pt will demonstrate ability to ambulate x 115' over indoor surfaces with white cane and negotiate 8 stairs with rails and supervision    Time  4    Period  Weeks    Status  New    Target Date  04/12/19            Plan - 03/24/19 0908    Clinical Impression Statement  Pt not reporting significant spinning sensation today but brief episodes of a "falling" sensation with L hallpike-dix; pt continues  to have sensation of vertigo with horizontal roll test but symptoms more severe to R today.  Symptoms of dizziness would settle with time but pt's nystagmus demonstrated persistant geotrophic direction; intermittently would change to pure downbeating nystagmus which may continue to indicate a mixture of peripheral and central pathology.  Provided pt with repeated rolling for treatment.  initiated standing balance and gait training. Husband observed so that he could assist with HEP at home.  Will continue to assess in order to progress towards LTG.    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, IBS, HTN, HLD, femoral artery thrombosis, diverticulosis, cataract, OA, R shoulder surgery, lumbar laminectomy, R cannulated hip surgery, and embolectomy    Examination-Activity Limitations  Bed Mobility;Locomotion Level;Stand;Transfers;Stairs    Examination-Participation Restrictions  Shop;Community Activity    Stability/Clinical Decision Making  Evolving/Moderate complexity    Rehab Potential  Good    PT Frequency  2x / week    PT Duration  4 weeks    PT Treatment/Interventions  ADLs/Self Care Home Management;Canalith Repostioning;Gait training;Stair training;Functional mobility training;Therapeutic activities;Therapeutic exercise;Balance training;Neuromuscular re-education;Patient/family education;Vestibular    PT Next Visit Plan  PT IS COMPLETELY VISUALLY IMPAIRED - USES WHITE CANE.  Pt has  a mixture of pure lateral and pure downbeat nystagmus; does not appear to have posterior canal involvement.  Difficult to assess if horizontal roll is BPPV or central in nature because pt's subjective vertigo is resolves in <30 seconds but nystagmus is persistant.  How is she tolerating repeated rolling for habituation?  Continue to work on  standing balance, gait without HHA.  Progress HEP    Consulted and Agree with Plan of Care  Patient;Family member/caregiver    Family Member Consulted  Husband       Patient will benefit from skilled therapeutic intervention in order to improve the following deficits and impairments:  Decreased balance, Difficulty walking, Dizziness  Visit Diagnosis: BPPV (benign paroxysmal positional vertigo), left  BPPV (benign paroxysmal positional vertigo), right  Dizziness and giddiness  Unsteadiness on feet  Repeated falls  Difficulty in walking, not elsewhere classified     Problem List Patient Active Problem List   Diagnosis Date Noted  . Vertigo of central origin 02/09/2019  . Other peripheral vertigo, unspecified ear 02/09/2019  . Nonintractable headache 02/09/2019  . Community acquired pneumonia 02/05/2019  . Hypothermia 02/05/2019  . CAP (community acquired pneumonia) 02/05/2019  . Pruritus 01/02/2019  . Rib pain on left side 08/09/2018  . Hypokalemia 08/09/2018  . Long term (current) use of anticoagulants 08/01/2018  . Pre-operative clearance 08/01/2018  . Closed compression fracture of L4 vertebra ([place]) 07/11/2018  . Bilateral leg edema 07/11/2018  . Lung nodule < 6cm on CT 07/11/2018  . Subclinical hypothyroidism 04/11/2018  . Fracture of femoral neck, right ([facility]) 03/22/2018  . PAF (paroxysmal atrial fibrillation) ([facility]) 03/18/2018  . 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 Herpes zoster without complication [id number]  . Jaw claudication 07/27/2017  . Temporal arteritis ([facility]) 07/18/2017  . Vision, loss, sudden, bilateral 07/18/2017  . 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 Osteoarthritis 06/25/2016  . Anxiety 06/25/2016  . Back pain 07/18/2015  . 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 Carotid bruit present 06/05/2010  . Osteoporosis 06/03/2008  . History of colonic polyps 06/03/2008  . INTENTION TREMOR 12/06/2007  . IBS 10/16/2007  . Hyperlipidemia 06/06/2007  .  Essential hypertension 06/06/2007  . Chronic obstructive airway disease with asthma ([facility]) 02/23/2007    Rico Junker, PT, DPT 03/24/19    9:18 AM    [facility] [address] [place], [place], [address] Phone: [phone]   Fax:  [phone]  Name: TRACY-LEE SKEENS MRN: [id number] Date of Birth: Oct 17, 1945

## 2019-03-26 ENCOUNTER — Encounter: Payer: Self-pay | Admitting: Rehabilitative and Restorative Service Providers"

## 2019-03-26 ENCOUNTER — Other Ambulatory Visit: Payer: Self-pay

## 2019-03-26 ENCOUNTER — Ambulatory Visit: Payer: Medicare Other | Admitting: Rehabilitative and Restorative Service Providers"

## 2019-03-26 DIAGNOSIS — R2681 Unsteadiness on feet: Secondary | ICD-10-CM | POA: Diagnosis not present

## 2019-03-26 DIAGNOSIS — R296 Repeated falls: Secondary | ICD-10-CM | POA: Diagnosis not present

## 2019-03-26 DIAGNOSIS — H8112 Benign paroxysmal vertigo, left ear: Secondary | ICD-10-CM | POA: Diagnosis not present

## 2019-03-26 DIAGNOSIS — H8111 Benign paroxysmal vertigo, right ear: Secondary | ICD-10-CM | POA: Diagnosis not present

## 2019-03-26 DIAGNOSIS — R262 Difficulty in walking, not elsewhere classified: Secondary | ICD-10-CM | POA: Diagnosis not present

## 2019-03-26 DIAGNOSIS — R42 Dizziness and giddiness: Secondary | ICD-10-CM | POA: Diagnosis not present

## 2019-03-26 NOTE — Patient Instructions (Signed)
Habituation - Tip Card  1.The goal of habituation training is to assist in decreasing symptoms of vertigo, dizziness, or nausea provoked by specific head and body motions. 2.These exercises may initially increase symptoms; however, be persistent and work through symptoms. With repetition and time, the exercises will assist in reducing or eliminating symptoms. 3.Exercises should be stopped and discussed with the therapist if you experience any of the following: - Sudden change or fluctuation in hearing - New onset of ringing in the ears, or increase in current intensity - Any fluid discharge from the ear - Severe pain in neck or back - Extreme nausea  Copyright  VHI. All rights reserved.   Habituation - Rolling   With pillow under head, start on back. Roll to your right side.  Hold until dizziness stops, plus 20 seconds and then roll to the left side.  Hold until dizziness stops, plus 20 seconds.  Repeat sequence 5 times per session. Do 2 sessions per day.      Walking: Phase 1    Stand next to your counter, one hand on the counter.  Walk forwards slowly taking a full step with each foot, try to keep your balance in the middle. Repeat 4 times.    Feet Apart, Head Motion - Eyes Closed   With eyes closed and feet shoulder width apart, chair in front for support if needed.  Move head slowly, up and down 10 times, side to side 10 times. Repeat 2 times per session. Do 2 sessions per day.    BEGIN THIS EXERCISE ON Wednesday 10/14 IF ANY DIZZINESS REMAINS WHEN ROLLING IN BED.  Habituation - Sit to Side-Lying   Sit on edge of bed. Lie down onto the right side and hold until dizziness stops, plus 20 seconds.  Return to sitting and wait until dizziness stops, plus 20 seconds.  Repeat to the left side. Repeat sequence 5 times per session. Do 2 sessions per day.  Copyright  VHI. All rights reserved.

## 2019-03-26 NOTE — Therapy (Signed)
[facility] [address] [place], [place], [address] Phone: [phone]   Fax:  [phone]  Physical Therapy Treatment  Patient Details  Name: Crystal [name] MRN: [id number] Date of Birth: 11-25-1945 Referring Provider (PT): [name], MD   Encounter Date: 03/26/2019  PT End of Session - 03/26/19 1420    Visit Number  4    Number of Visits  9    Date for PT Re-Evaluation  04/12/19    Authorization Type  [employer] Medicare; $40 copay.  10th visit PN.  No VL    PT Start Time  0933    PT Stop Time  1015    PT Time Calculation (min)  42 min    Activity Tolerance  Patient tolerated treatment well    Behavior During Therapy  WFL for tasks assessed/performed       Past Medical History:  Diagnosis Date  . Adenomatous colon polyp 2007 & 2010    Dr [name]  . Allergy    seasonal  . Arthritis   . Cataract   . Diverticulosis   . 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 Hyperlipidemia   . Hypertension   . IBS (irritable bowel syndrome)   . Legally blind   . MVP (mitral valve prolapse)   . Paroxysmal atrial fibrillation with rapid ventricular response ([facility]) 08/29/2017  . Peripheral vascular disease ([facility])   . RAD (reactive airway disease)     Past Surgical History:  Procedure Laterality Date  . ABDOMINAL HYSTERECTOMY     with USO for dysfunctionall menses  . APPENDECTOMY    . ARTERY BIOPSY Bilateral 07/21/2017   Procedure: BIOPSY BILATERAL TEMPORAL ARTERY;  Surgeon: [name], MD;  Location: [facility];  Service: Vascular;  Laterality: Bilateral;  . CARDIOVERSION N/A 06/16/2018   Procedure: CARDIOVERSION;  Surgeon: [name], MD;  Location: [facility];  Service: Cardiovascular;  Laterality: N/A;  . CATARACT EXTRACTION W/ INTRAOCULAR LENS IMPLANT  2013   bilateral; Dr [name]  . COLONOSCOPY  2013   negative , due 2018; Dr [name]  . COLONOSCOPY W/ POLYPECTOMY      X2 ; diverticulosis  .  EMBOLECTOMY Right 08/29/2017   Procedure: Right Groin Exploration  ;  Surgeon: Rosetta Posner, MD;  Location: [facility];  Service: Vascular;  Laterality: Right;  . HIP PINNING,CANNULATED Right 03/21/2018  . HIP PINNING,CANNULATED Right 03/21/2018   Procedure: CANNULATED HIP PINNING;  Surgeon: Rod Can, MD;  Location: [facility];  Service: Orthopedics;  Laterality: Right;  . LUMBAR LAMINECTOMY  2000   Dr Vertell Limber  . SHOULDER SURGERY     R shoulder  . TONSILLECTOMY      There were no vitals filed for this visit.  Subjective Assessment - 03/26/19 0932    Subjective  Still noticing dizziness when she rolls in bed and her eyes "feel different" today.  The dizziness lasts x a minute or two.  She notes sitting on the side of the bed when she rises to let everything "balance out."  It can also increase nausea.    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  Yes    Pain Location  Head    Effect of Pain on Daily Activities  PT to monitor headache.             Vestibular Assessment - 03/26/19 0939      Positional Testing   Dix-Hallpike  Dix-Hallpike Right;Dix-Hallpike Left    Horizontal Canal Testing  Horizontal Canal Right;Horizontal Canal Left      Dix-Hallpike Right   Dix-Hallpike Right Duration  8 seconds upbeating, R rotary that moves to R beating nystagmus that lasts for >30 seconds.  Patient's subjective c/o dizziness stops after upbeating, R rotary nystagmus stops    Dix-Hallpike Right Symptoms  Upbeat, right rotatory nystagmus;Right nystagmus      Dix-Hallpike Left   Dix-Hallpike Left Duration  Sensation that it may come on but no nystagmus    Dix-Hallpike Left  Symptoms  Left nystagmus      Horizontal Canal Right   Horizontal Canal Right Duration  35 seconds    Horizontal Canal Right Symptoms  Geotrophic      Horizontal Canal Left   Horizontal Canal Left Duration  none    Horizontal Canal Left Symptoms  Normal                Vestibular Treatment/Exercise - 03/26/19 0948      Vestibular Treatment/Exercise   Vestibular Treatment Provided  Canalith Repositioning;Habituation    Canalith Repositioning  Epley Manuever Right;Canal Roll Right    Habituation Exercises  Brandt Daroff;Horizontal Roll       EPLEY MANUEVER RIGHT   Number of Reps   1    Overall Response  Improved Symptoms    Response Details   Patient does not have upbeating, R rotary nystagmus after re-testing      Canal Roll Right   Number of Reps   1    Overall Response   Improved Symptoms    Response Details   continues with horizontal / geotropic nystagmus      Nestor Lewandowsky   Number of Reps   2    Symptom Description   sensation that it may come on      Horizontal Roll   Number of Reps   3    Symptom Description   reduces intensity of symptoms with repetition            PT Education - 03/26/19 1420    Education Details  added brandt daroff habituation to current HEP    Person(s) Educated  Patient;Spouse    Methods  Explanation;Demonstration;Handout    Comprehension  Returned demonstration;Verbalized understanding          PT Long Term Goals - 03/13/19 1533      PT LONG TERM GOAL #1   Title  Pt will demonstrate ability to perform final HEP with supervision    Time  4    Period  Weeks    Status  New    Target Date  04/12/19      PT LONG TERM GOAL #2   Title  Pt will demonstrate negative positional testing for multiple canals    Time  4    Period  Weeks    Status  New    Target Date  04/12/19      PT LONG TERM GOAL #3   Title  Pt will report no spinning when performing rolling in bed and during supine <> sit    Time  4    Period  Weeks     Status  New    Target  Date  04/12/19      PT LONG TERM GOAL #4   Title  Pt will demonstrate ability to ambulate x 115' over indoor surfaces with white cane and negotiate 8 stairs with rails and supervision    Time  4    Period  Weeks    Status  New    Target Date  04/12/19            Plan - 03/26/19 1425    Clinical Impression Statement  The patient continues with R posterior canalithiasis and horizontal canalithiasis.  At today's visit, it appears horizontal cupulolithiasis is on the right side with more intensity with R rolling.  The patient had resolution of upbeating, rotary nystagmus after R epley's.  Due to recurring symptoms, the patient was instructed in brandt daroff HEP to add to horizontal rolling and balance activities.    Comorbidities  --    PT Treatment/Interventions  ADLs/Self Care Home Management;Canalith Repostioning;Gait training;Stair training;Functional mobility training;Therapeutic activities;Therapeutic exercise;Balance training;Neuromuscular re-education;Patient/family education;Vestibular    PT Next Visit Plan  PT IS COMPLETELY VISUALLY IMPAIRED - USES WHITE CANE.  Reassess BPPV (appeared R posterior canal and R horiozntla canalithiasis today).   How is she tolerating repeated rolling for habituation and brandt daroff?  Continue to work on standing balance, gait without HHA.  Progress HEP    Consulted and Agree with Plan of Care  Patient;Family member/caregiver    Family Member Consulted  Husband       Patient will benefit from skilled therapeutic intervention in order to improve the following deficits and impairments:  Decreased balance, Difficulty walking, Dizziness  Visit Diagnosis: BPPV (benign paroxysmal positional vertigo), right  Dizziness and giddiness  Unsteadiness on feet  Repeated falls     Problem List Patient Active Problem List   Diagnosis Date Noted  . Vertigo of central origin 02/09/2019  . Other peripheral vertigo, unspecified  ear 02/09/2019  . Nonintractable headache 02/09/2019  . Community acquired pneumonia 02/05/2019  . Hypothermia 02/05/2019  . CAP (community acquired pneumonia) 02/05/2019  . Pruritus 01/02/2019  . Rib pain on left side 08/09/2018  . Hypokalemia 08/09/2018  . Long term (current) use of anticoagulants 08/01/2018  . Pre-operative clearance 08/01/2018  . Closed compression fracture of L4 vertebra ([place] [facility]) 07/11/2018  . Bilateral leg edema 07/11/2018  . Lung nodule < 6cm on CT 07/11/2018  . Subclinical hypothyroidism 04/11/2018  . Fracture of femoral neck, right ([place]) 03/22/2018  . PAF (paroxysmal atrial fibrillation) ([facility]) 03/18/2018  . Femoral artery thrombosis, right ([place]) 09/01/2017  . Herpes zoster without complication [id number]  . Jaw claudication 07/27/2017  . Temporal arteritis ([facility]) 07/18/2017  . Vision, loss, sudden, bilateral 07/18/2017  . 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 Osteoarthritis 06/25/2016  . Anxiety 06/25/2016  . Back pain 07/18/2015  . 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 Carotid bruit present 06/05/2010  . Osteoporosis 06/03/2008  . History of colonic polyps 06/03/2008  . INTENTION TREMOR 12/06/2007  . IBS 10/16/2007  . Hyperlipidemia 06/06/2007  . Essential hypertension 06/06/2007  . Chronic obstructive airway disease with asthma ([place]) 02/23/2007    Ermalinda Joubert, PT 03/26/2019, 2:45 PM  [facility] [address] [place], [place], [address] Phone: [phone]   Fax:  [phone]  Name: SAVAYA RUSTON MRN: [id number] Date of Birth: 07/18/1945

## 2019-03-31 ENCOUNTER — Other Ambulatory Visit: Payer: Self-pay | Admitting: Internal Medicine

## 2019-04-03 ENCOUNTER — Ambulatory Visit: Payer: Medicare Other | Admitting: Physical Therapy

## 2019-04-03 ENCOUNTER — Other Ambulatory Visit: Payer: Self-pay

## 2019-04-03 ENCOUNTER — Encounter: Payer: Self-pay | Admitting: Physical Therapy

## 2019-04-03 DIAGNOSIS — R262 Difficulty in walking, not elsewhere classified: Secondary | ICD-10-CM

## 2019-04-03 DIAGNOSIS — H8112 Benign paroxysmal vertigo, left ear: Secondary | ICD-10-CM

## 2019-04-03 DIAGNOSIS — R42 Dizziness and giddiness: Secondary | ICD-10-CM

## 2019-04-03 DIAGNOSIS — R2681 Unsteadiness on feet: Secondary | ICD-10-CM

## 2019-04-03 DIAGNOSIS — H8111 Benign paroxysmal vertigo, right ear: Secondary | ICD-10-CM | POA: Diagnosis not present

## 2019-04-03 DIAGNOSIS — R296 Repeated falls: Secondary | ICD-10-CM

## 2019-04-03 NOTE — Therapy (Signed)
[facility] [address] [place], [place], [address] Phone: [phone]   Fax:  [phone]  Physical Therapy Treatment  Patient Details  Name: Crystal [name] MRN: [id number] Date of Birth: 11-16-1945 Referring Provider (PT): [name], MD   Encounter Date: 04/03/2019  PT End of Session - 04/03/19 1512    Visit Number  5    Number of Visits  9    Date for PT Re-Evaluation  04/12/19    Authorization Type  BCBS Medicare; $40 copay.  10th visit PN.  No VL    PT Start Time  1320    PT Stop Time  1405    PT Time Calculation (min)  45 min    Activity Tolerance  Patient tolerated treatment well    Behavior During Therapy  WFL for tasks assessed/performed       Past Medical History:  Diagnosis Date  . Adenomatous colon polyp 2007 & 2010    Dr [name]  . Allergy    seasonal  . Arthritis   . Cataract   . Diverticulosis   . 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 Hyperlipidemia   . Hypertension   . IBS (irritable bowel syndrome)   . Legally blind   . MVP (mitral valve prolapse)   . Paroxysmal atrial fibrillation with rapid ventricular response ([place] [facility]) 08/29/2017  . Peripheral vascular disease ([place])   . RAD (reactive airway disease)     Past Surgical History:  Procedure Laterality Date  . ABDOMINAL HYSTERECTOMY     with USO for dysfunctionall menses  . APPENDECTOMY    . ARTERY BIOPSY Bilateral 07/21/2017   Procedure: BIOPSY BILATERAL TEMPORAL ARTERY;  Surgeon: [name], MD;  Location: [facility];  Service: Vascular;  Laterality: Bilateral;  . CARDIOVERSION N/A 06/16/2018   Procedure: CARDIOVERSION;  Surgeon: [name], MD;  Location: [facility];  Service: Cardiovascular;  Laterality: N/A;  . CATARACT EXTRACTION W/ INTRAOCULAR LENS IMPLANT  2013   bilateral; Dr [name]  . COLONOSCOPY  2013   negative , due 2018; Dr [name]  . COLONOSCOPY W/ POLYPECTOMY      X2 ; diverticulosis  .  EMBOLECTOMY Right 08/29/2017   Procedure: Right Groin Exploration  ;  Surgeon: Rosetta Posner, MD;  Location: [facility];  Service: Vascular;  Laterality: Right;  . HIP PINNING,CANNULATED Right 03/21/2018  . HIP PINNING,CANNULATED Right 03/21/2018   Procedure: CANNULATED HIP PINNING;  Surgeon: Rod Can, MD;  Location: [facility];  Service: Orthopedics;  Laterality: Right;  . LUMBAR LAMINECTOMY  2000   Dr Vertell Limber  . SHOULDER SURGERY     R shoulder  . TONSILLECTOMY      There were no vitals filed for this visit.  Subjective Assessment - 04/03/19 1324    Subjective  Ambulating with more confidence today and larger steps.  Has been practicing at home and feels more stable when on her feet now; still feels spinning to the L when lying down.  Nothing to the right.    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  Yes   chronic headache  Vestibular Assessment - 04/03/19 1327      Positional Testing   Dix-Hallpike  Dix-Hallpike Left    Horizontal Canal Testing  Horizontal Canal Right;Horizontal Canal Left      Dix-Hallpike Left   Dix-Hallpike Left Duration  5 seconds    Dix-Hallpike Left Symptoms  Downbeat, left rotatory nystagmus      Horizontal Canal Right   Horizontal Canal Right Duration  2 seconds    Horizontal Canal Right Symptoms  Geotrophic      Horizontal Canal Left   Horizontal Canal Left Duration  5-10 seconds    Horizontal Canal Left Symptoms  Geotrophic               [facility] Adult PT Treatment/Exercise - 04/03/19 0001      Ambulation/Gait   Ambulation/Gait  Yes    Ambulation/Gait Assistance  4: Min assist    Ambulation/Gait  Assistance Details  without HHA but with therapist supporting pt at pelvis for balance with pt focusing on increasing step length bilaterally    Ambulation Distance (Feet)  230 Feet    Assistive device  Other (Comment)    Gait Pattern  Step-through pattern    Ambulation Surface  Level;Indoor      Vestibular Treatment/Exercise - 04/03/19 1338      Vestibular Treatment/Exercise   Vestibular Treatment Provided  Canalith Repositioning      Canal Roll Left   Number of Reps   2    Overall Response   Improved Symptoms    Response Details   decreased intensity and duration second set            PT Education - 04/03/19 1401    Education Details  hold habituation x 48 hours and then resume.  Continue walking and decreasing HHA at home with family/friend supporting at pelvis.    Person(s) Educated  Patient;Spouse    Methods  Explanation;Demonstration    Comprehension  Verbalized understanding;Returned demonstration          PT Long Term Goals - 03/13/19 1533      PT LONG TERM GOAL #1   Title  Pt will demonstrate ability to perform final HEP with supervision    Time  4    Period  Weeks    Status  New    Target Date  04/12/19      PT LONG TERM GOAL #2   Title  Pt will demonstrate negative positional testing for multiple canals    Time  4    Period  Weeks    Status  New    Target Date  04/12/19      PT LONG TERM GOAL #3   Title  Pt will report no spinning when performing rolling in bed and during supine <> sit    Time  4    Period  Weeks    Status  New    Target Date  04/12/19      PT LONG TERM GOAL #4   Title  Pt will demonstrate ability to ambulate x 115' over indoor surfaces with white cane and negotiate 8 stairs with rails and supervision    Time  4    Period  Weeks    Status  New    Target Date  04/12/19            Plan - 04/03/19 1400    Clinical Impression Statement  Pt is demonstrating improvements in standing balance and decreased symptoms of  vertigo with positional  changes.  Only experienced mild dizziness with rolling to L but for shorter duration; symptoms improved after L BBQ roll.  Continued dynamic standing balance training by initiating gait without HHA but with therapist providing support at pelvis to assist if pt did experience LOB.  Pt to continue working on this at home.  A few more visits added to ensure mitigation of BPPV symptoms and to continue to address standing balance impairments.    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, IBS, HTN, HLD, femoral artery thrombosis, diverticulosis, cataract, OA, R shoulder surgery, lumbar laminectomy, R cannulated hip surgery, and embolectomy    Examination-Activity Limitations  Bed Mobility;Locomotion Level;Stand;Transfers;Stairs    Examination-Participation Restrictions  Shop;Community Activity    Stability/Clinical Decision Making  Evolving/Moderate complexity    Rehab Potential  Good    PT Frequency  2x / week    PT Duration  4 weeks    PT Treatment/Interventions  ADLs/Self Care Home Management;Canalith Repostioning;Gait training;Stair training;Functional mobility training;Therapeutic activities;Therapeutic exercise;Balance training;Neuromuscular re-education;Patient/family education;Vestibular    PT Next Visit Plan  PT IS COMPLETELY VISUALLY IMPAIRED - USES WHITE CANE.  Anderson Malta - please check goals and send to me for recert. Continue to assess for L Horizontal canal BPPV - treat if indicated. Continue to work on standing balance, gait without HHA.  Progress HEP    Consulted and Agree with Plan of Care  Patient;Family member/caregiver    Family Member Consulted  Husband       Patient will benefit from skilled therapeutic intervention in order to improve the following deficits and impairments:   Decreased balance, Difficulty walking, Dizziness  Visit Diagnosis: Dizziness and giddiness  Unsteadiness on feet  Repeated falls  BPPV (benign paroxysmal positional vertigo), left  Difficulty in walking, not elsewhere classified     Problem List Patient Active Problem List   Diagnosis Date Noted  . Vertigo of central origin 02/09/2019  . Other peripheral vertigo, unspecified ear 02/09/2019  . Nonintractable headache 02/09/2019  . Community acquired pneumonia 02/05/2019  . Hypothermia 02/05/2019  . CAP (community acquired pneumonia) 02/05/2019  . Pruritus 01/02/2019  . Rib pain on left side 08/09/2018  . Hypokalemia 08/09/2018  . Long term (current) use of anticoagulants 08/01/2018  . Pre-operative clearance 08/01/2018  . Closed compression fracture of L4 vertebra ([place]) 07/11/2018  . Bilateral leg edema 07/11/2018  . Lung nodule < 6cm on CT 07/11/2018  . Subclinical hypothyroidism 04/11/2018  . Fracture of femoral neck, right ([place]) 03/22/2018  . PAF (paroxysmal atrial fibrillation) ([facility]) 03/18/2018  . 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 Herpes zoster without complication [id number]  . Jaw claudication 07/27/2017  . Temporal arteritis ([place]) 07/18/2017  . Vision, loss, sudden, bilateral 07/18/2017  . 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 Osteoarthritis 06/25/2016  . Anxiety 06/25/2016  . Back pain 07/18/2015  . 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 Carotid bruit present 06/05/2010  . Osteoporosis 06/03/2008  . History of colonic polyps 06/03/2008  . INTENTION TREMOR 12/06/2007  . IBS 10/16/2007  . Hyperlipidemia 06/06/2007  . Essential hypertension 06/06/2007  . Chronic obstructive airway disease with asthma ([place]) 02/23/2007    Rico Junker, PT, DPT 04/03/19    3:17 PM    [facility] [address] [place], [place], [address] Phone: [phone]   Fax:  [phone]  Name:  Crystal [name] MRN:  [id number] Date of Birth: 1946-04-20

## 2019-04-06 ENCOUNTER — Other Ambulatory Visit: Payer: Self-pay | Admitting: Internal Medicine

## 2019-04-12 ENCOUNTER — Ambulatory Visit: Payer: Medicare Other

## 2019-04-12 ENCOUNTER — Other Ambulatory Visit: Payer: Self-pay

## 2019-04-12 DIAGNOSIS — H8112 Benign paroxysmal vertigo, left ear: Secondary | ICD-10-CM

## 2019-04-12 DIAGNOSIS — R296 Repeated falls: Secondary | ICD-10-CM

## 2019-04-12 DIAGNOSIS — H8111 Benign paroxysmal vertigo, right ear: Secondary | ICD-10-CM | POA: Diagnosis not present

## 2019-04-12 DIAGNOSIS — R2681 Unsteadiness on feet: Secondary | ICD-10-CM | POA: Diagnosis not present

## 2019-04-12 DIAGNOSIS — R42 Dizziness and giddiness: Secondary | ICD-10-CM

## 2019-04-12 DIAGNOSIS — R262 Difficulty in walking, not elsewhere classified: Secondary | ICD-10-CM | POA: Diagnosis not present

## 2019-04-12 NOTE — Therapy (Signed)
[facility] [address] [place], [place], [address] Phone: [phone]   Fax:  [phone]  Physical Therapy Treatment  Patient Details  Name: Crystal [name] MRN: [id number] Date of Birth: Nov 06, 1945 Referring Provider (PT): [name], MD   Encounter Date: 04/12/2019  PT End of Session - 04/12/19 1256    Visit Number  6    Number of Visits  9    Date for PT Re-Evaluation  04/12/19    Authorization Type  BCBS Medicare; $40 copay.  10th visit PN.  No VL    PT Start Time  1102    PT Stop Time  1147    PT Time Calculation (min)  45 min    Equipment Utilized During Treatment  Gait belt    Activity Tolerance  Patient tolerated treatment well    Behavior During Therapy  WFL for tasks assessed/performed       Past Medical History:  Diagnosis Date  . Adenomatous colon polyp 2007 & 2010    Dr [name]  . Allergy    seasonal  . Arthritis   . Cataract   . Diverticulosis   . Femoral artery thrombosis, right ([facility]) 09/01/2017  . Hyperlipidemia   . Hypertension   . IBS (irritable bowel syndrome)   . Legally blind   . MVP (mitral valve prolapse)   . Paroxysmal atrial fibrillation with rapid ventricular response ([facility]) 08/29/2017  . Peripheral vascular disease ([place])   . RAD (reactive airway disease)     Past Surgical History:  Procedure Laterality Date  . ABDOMINAL HYSTERECTOMY     with USO for dysfunctionall menses  . APPENDECTOMY    . ARTERY BIOPSY Bilateral 07/21/2017   Procedure: BIOPSY BILATERAL TEMPORAL ARTERY;  Surgeon: [name], MD;  Location: [facility];  Service: Vascular;  Laterality: Bilateral;  . CARDIOVERSION N/A 06/16/2018   Procedure: CARDIOVERSION;  Surgeon: [name], MD;  Location: [facility];  Service: Cardiovascular;  Laterality: N/A;  . CATARACT EXTRACTION W/ INTRAOCULAR LENS IMPLANT  2013   bilateral; Dr [name]  . COLONOSCOPY  2013   negative , due 2018; Dr [name]  . COLONOSCOPY  W/ POLYPECTOMY      X2 ; diverticulosis  . EMBOLECTOMY Right 08/29/2017   Procedure: Right Groin Exploration  ;  Surgeon: Rosetta Posner, MD;  Location: [facility];  Service: Vascular;  Laterality: Right;  . HIP PINNING,CANNULATED Right 03/21/2018  . HIP PINNING,CANNULATED Right 03/21/2018   Procedure: CANNULATED HIP PINNING;  Surgeon: Rod Can, MD;  Location: [facility];  Service: Orthopedics;  Laterality: Right;  . LUMBAR LAMINECTOMY  2000   Dr Vertell Limber  . SHOULDER SURGERY     R shoulder  . TONSILLECTOMY      There were no vitals filed for this visit.  Subjective Assessment - 04/12/19 1106    Subjective  Pt denied falls or changes since last visit. Pt still gets dizzy rolling to the L side in bed, at worst: 5/10, at best: 0/10.    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  Yes    Pain Score  10-Worst pain ever    Pain Location  Hand   generalized arthritis pain   Pain Descriptors / Indicators  Aching;Constant  Pain Type  Chronic pain    Pain Onset  More than a month ago    Aggravating Factors   Weather    Pain Relieving Factors  Muscle relaxers help a bit             Vestibular Assessment - 04/12/19 1141      Positional Testing   Dix-Hallpike  Dix-Hallpike Left    Horizontal Canal Testing  Horizontal Canal Right;Horizontal Canal Left      Dix-Hallpike Left   Dix-Hallpike Left Duration  15 sec. with concordant dizziness    Dix-Hallpike Left Symptoms  Downbeat, left rotatory nystagmus      Horizontal Canal Right   Horizontal Canal Right Duration  10 sec. with no dizziness reported.     Horizontal Canal Right Symptoms  Geotrophic      Horizontal Canal Left   Horizontal Canal Left Duration  5 sec. with concordant  dizziness.     Horizontal Canal Left Symptoms  Geotrophic               [facility] Adult PT Treatment/Exercise - 04/12/19 1121      Ambulation/Gait   Ambulation/Gait  Yes    Ambulation/Gait Assistance  4: Min guard    Ambulation/Gait Assistance Details  without HHA, but with pt using white cane. Pt able to improve stride length with cues.     Ambulation Distance (Feet)  50 Feet   175'   Assistive device  Other (Comment)   white cane   Gait Pattern  Step-through pattern;Decreased stride length    Ambulation Surface  Level;Indoor    Stairs  Yes    Stairs Assistance  4: Min guard    Stair Management Technique  Alternating pattern;Step to pattern;Two rails;Forwards   alternating to ascend, step to to descend   Number of Stairs  8    Height of Stairs  6      Vestibular Treatment/Exercise - 04/12/19 1146      Vestibular Treatment/Exercise   Vestibular Treatment Provided  Canalith Repositioning      Canal Roll Left   Number of Reps   1    Overall Response   Improved Symptoms    Response Details   Decr. dizziness and heaviness of head per pt.      Neuro re-ed: reviewed HEP and progressed as tolerated. Performed with min guard to S on mat or in corner for safety. Habituation - Tip Card  1.The goal of habituation training is to assist in decreasing symptoms of vertigo, dizziness, or nausea provoked by specific head and body motions. 2.These exercises may initially increase symptoms; however, be persistent and work through symptoms. With repetition and time, the exercises will assist in reducing or eliminating symptoms. 3.Exercises should be stopped and discussed with the therapist if you experience any of the following: - Sudden change or fluctuation in hearing - New onset of ringing in the ears, or increase in current intensity - Any fluid discharge from the ear - Severe pain in neck or back - Extreme nausea  Copyright  VHI. All rights reserved.  Habituation - Rolling With  pillow under head, start on back. Roll to your right side. Hold until dizziness stops, plus 20 seconds and then roll to the left side. Hold until dizziness stops, plus 20 seconds. Repeat sequence 5 times per session. Do 2 sessions per day.  Walking: verbalized understanding. Stand next to your counter, one hand on the counter. Walk forwards slowly taking a full step with  each foot, try to keep your balance in the middle. Repeat 4 times.  Feet Apart, Head Motion - Eyes Closed With eyes closed and feet shoulder width apart, chair in front for support if needed. Move head slowly, up and down10 times, side to side 10 times. Repeat2times per session. Do 2sessions per day.   BEGIN THIS EXERCISE ON Wednesday 10/14 IF ANY DIZZINESS REMAINS WHEN ROLLING IN BED.  Habituation - Sit to Side-Lying Sit on edge of bed. Lie down onto the right side and hold until dizziness stops, plus 20 seconds.  Return to sitting and wait until dizziness stops, plus 20 seconds.  Repeat to the left side. Repeat sequence 5 times per session. Do 2 sessions per day.  Copyright  VHI. All rights reserved.      Balance Exercises - 04/12/19 1252      Balance Exercises: Standing   Standing Eyes Opened  Narrow base of support (BOS);Wide (BOA);Head turns;Solid surface;Other reps (comment);10 secs;30 secs;2 reps   10 reps head turns/nods   Standing Eyes Closed  Narrow base of support (BOS);Wide (BOA);Solid surface;2 reps;10 secs    Other Standing Exercises  Performed in corner with min guard to S to ensure safety.  PT reviewed HEP and progressed as indicated. Incr. postural sway noted with head turn and eyes closed with feet together.        PT Education - 04/12/19 1254    Education Details  PT educated pt on goal progress and that unmet goals will be carried over to new POC. PT progressed HEP as indicated during standing balance activities. PT again had pt hold habituation for 48 hours and then resume. PT  reiterated attempting to decr. HHA at home.    Person(s) Educated  Patient;Spouse    Methods  Explanation;Tactile cues;Verbal cues;Handout;Demonstration    Comprehension  Verbalized understanding;Returned demonstration        PT Long Term Goals - 04/12/19 1256      PT LONG TERM GOAL #1   Title  Pt will demonstrate ability to perform final HEP with supervision.  ALL UNMET GOALS WILL BE CARRIED OVER TO NEW POC: (05/24/19)    Time  4   NEW POC WILL BE 1X/WEEK FOR 6 WEEKS   Period  Weeks    Status  Achieved      PT LONG TERM GOAL #2   Title  Pt will demonstrate negative positional testing for multiple  canals    Time  4    Period  Weeks    Status  Not Met      PT LONG TERM GOAL #3   Title  Pt will report no spinning when performing rolling in bed and  during supine <> sit    Time  4    Period  Weeks    Status  Not Met      PT LONG TERM GOAL #4   Title  Pt will demonstrate ability to ambulate x 115' over indoor  surfaces with white cane and negotiate 8 stairs with rails and supervision     Time  4    Period  Weeks    Status  Partially Met      PT Long Term Goals - 04/12/19 1256      PT LONG TERM GOAL #1   Title  Pt will demonstrate ability to perform final HEP with supervision. ALL UNMET GOALS WILL BE CARRIED OVER TO NEW POC: (05/24/19)    Time  4    Period  Weeks    Status  Achieved      PT LONG TERM GOAL #2   Title  Pt will demonstrate negative positional testing for multiple canals    Time  6    Period  Weeks    Status  On-going      PT LONG TERM GOAL #3   Title  Pt will report no spinning when performing rolling in bed and during supine <> sit    Time  6    Period  Weeks    Status  On-going      PT LONG TERM GOAL #4   Title  Pt will demonstrate ability to ambulate x 115' over indoor surfaces with white cane and negotiate 8 stairs with rails and supervision    Time  6    Period  Weeks    Status  On-going            Plan - 04/12/19 1259     Clinical Impression Statement  Pt demonstrating progress, as she reports decr. dizziness during positional changes and was able to tolerate progression of standing balance activities. Pt does continue to experience incr. postural sway during activities which require incr. vestibular input and  requires cues to improve stride length during gait. Pt did amb. during PT today with white cane and  min guard at pelvis vs. HHA. Pt met LTG 1, partially met LTG 4 and did not meet LTGs 2 and 3. Pt continues to report improved s/s of dizziness after L BBQ roll. PT requesting addtional visits to improve safety during functional mobility. All unmet goals will be carried over to new POC.    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, IBS, HTN, HLD, femoral artery thrombosis, diverticulosis, cataract, OA, R shoulder surgery, lumbar laminectomy, R cannulated hip surgery, and embolectomy    Examination-Activity Limitations  Bed Mobility;Locomotion Level;Stand;Transfers;Stairs    Examination-Participation Restrictions  Shop;Community Activity    Stability/Clinical Decision Making  Evolving/Moderate complexity    Rehab Potential  Good    PT Frequency  2x / week   requesting additonal visits   PT Duration  4 weeks    PT Treatment/Interventions  ADLs/Self Care Home Management;Canalith Repostioning;Gait training;Stair training;Functional mobility training;Therapeutic activities;Therapeutic exercise;Balance training;Neuromuscular re-education;Patient/family education;Vestibular    PT Next Visit Plan  PT IS COMPLETELY VISUALLY IMPAIRED - USES WHITE CANE.   Continue to assess for L Horizontal canal BPPV - treat if indicated. Continue to work on standing balance, gait without HHA.  Progress HEP    Consulted and Agree with Plan of Care   Patient;Family member/caregiver    Family Member Consulted  Husband       Patient will benefit from skilled therapeutic intervention in order to improve the following deficits and impairments:  Decreased balance, Difficulty walking, Dizziness  Visit Diagnosis: BPPV (benign paroxysmal positional vertigo), left - Plan: PT plan of care cert/re-cert  BPPV (benign paroxysmal positional vertigo), right - Plan: PT plan of care cert/re-cert  Dizziness and giddiness - Plan: PT plan of care cert/re-cert  Unsteadiness on feet - Plan: PT plan of care cert/re-cert  Difficulty in walking, not elsewhere classified - Plan: PT plan of care cert/re-cert  Repeated falls - Plan: PT plan of care cert/re-cert     Problem List Patient Active Problem List   Diagnosis Date Noted  .  Vertigo of central origin 02/09/2019  . Other peripheral vertigo, unspecified ear 02/09/2019  . Nonintractable headache 02/09/2019  . Community acquired pneumonia 02/05/2019  . Hypothermia 02/05/2019  . CAP (community acquired pneumonia) 02/05/2019  . Pruritus 01/02/2019  . Rib pain on left side 08/09/2018  . Hypokalemia 08/09/2018  . Long term (current) use of anticoagulants 08/01/2018  . Pre-operative clearance 08/01/2018  . Closed compression fracture of L4 vertebra ([facility]) 07/11/2018  . Bilateral leg edema 07/11/2018  . Lung nodule < 6cm on CT 07/11/2018  . Subclinical hypothyroidism 04/11/2018  . Fracture of femoral neck, right ([place]) 03/22/2018  . PAF (paroxysmal atrial fibrillation) ([facility]) 03/18/2018  . Femoral artery thrombosis, right ([facility]) 09/01/2017  . Herpes zoster without complication 08/67/6195  . Jaw claudication 07/27/2017  . Temporal arteritis ([facility]) 07/18/2017  . Vision, loss, sudden, bilateral 07/18/2017  . 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 Osteoarthritis 06/25/2016  . Anxiety 06/25/2016  . Back pain 07/18/2015  . 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 Carotid bruit present 06/05/2010  . Osteoporosis 06/03/2008   . History of colonic polyps 06/03/2008  . INTENTION TREMOR 12/06/2007  . IBS 10/16/2007  . Hyperlipidemia 06/06/2007  . Essential hypertension 06/06/2007  . Chronic obstructive airway disease with asthma ([facility]) 02/23/2007    Geoffry Paradise, PT,DPT 04/12/19 1:22 PM Phone: [phone] Fax: [phone]    , L 04/12/2019, 1:22 PM  [facility] [address] [place], [place], [address] Phone: [phone]   Fax:  [phone]  Name: Crystal [name] MRN: [id number] Date of Birth: September 25, 1945

## 2019-04-12 NOTE — Patient Instructions (Addendum)
Habituation - Tip Card  1.The goal of habituation training is to assist in decreasing symptoms of vertigo, dizziness, or nausea provoked by specific head and body motions. 2.These exercises may initially increase symptoms; however, be persistent and work through symptoms. With repetition and time, the exercises will assist in reducing or eliminating symptoms. 3.Exercises should be stopped and discussed with the therapist if you experience any of the following: - Sudden change or fluctuation in hearing - New onset of ringing in the ears, or increase in current intensity - Any fluid discharge from the ear - Severe pain in neck or back - Extreme nausea  Copyright  VHI. All rights reserved.  Habituation - Rolling   With pillow under head, start on back. Roll to your right side. Hold until dizziness stops, plus 20 seconds and then roll to the left side. Hold until dizziness stops, plus 20 seconds. Repeat sequence 5 times per session. Do 2 sessions per day.      Walking: Phase 1    Stand next to your counter, one hand on the counter. Walk forwards slowly taking a full step with each foot, try to keep your balance in the middle. Repeat 4 times.    Feet Apart, Head Motion - Eyes Closed   With eyes closed and feet shoulder width apart, chair in front for support if needed. Move head slowly, up and down10 times, side to side 10 times. Repeat2times per session. Do 2sessions per day.STOP THIS ONE AND PERFORM WITH FEET TOGETHER.   Feet Together, Head Motion - Eyes Closed    With eyes closed and feet together, move head slowly, up and down 10 TIMES AND SIDE TO SIDE 10 TIMES. Repeat __2__ times per session. Do _2___ sessions per day.  Copyright  VHI. All rights reserved.     BEGIN THIS EXERCISE ON Wednesday 10/14 IF ANY DIZZINESS REMAINS WHEN ROLLING IN BED.  Habituation - Sit to Side-Lying   Sit on edge of bed. Lie down onto the right side and hold until  dizziness stops, plus 20 seconds.  Return to sitting and wait until dizziness stops, plus 20 seconds.  Repeat to the left side. Repeat sequence 5 times per session. Do 2 sessions per day.  Copyright  VHI. All rights reserved.

## 2019-04-16 ENCOUNTER — Ambulatory Visit: Payer: Medicare Other | Attending: Internal Medicine

## 2019-04-16 ENCOUNTER — Other Ambulatory Visit: Payer: Self-pay

## 2019-04-16 DIAGNOSIS — R262 Difficulty in walking, not elsewhere classified: Secondary | ICD-10-CM | POA: Diagnosis not present

## 2019-04-16 DIAGNOSIS — R296 Repeated falls: Secondary | ICD-10-CM | POA: Insufficient documentation

## 2019-04-16 DIAGNOSIS — H8112 Benign paroxysmal vertigo, left ear: Secondary | ICD-10-CM | POA: Diagnosis not present

## 2019-04-16 DIAGNOSIS — R2681 Unsteadiness on feet: Secondary | ICD-10-CM | POA: Diagnosis not present

## 2019-04-16 DIAGNOSIS — H8111 Benign paroxysmal vertigo, right ear: Secondary | ICD-10-CM | POA: Insufficient documentation

## 2019-04-16 DIAGNOSIS — R42 Dizziness and giddiness: Secondary | ICD-10-CM | POA: Insufficient documentation

## 2019-04-16 NOTE — Therapy (Signed)
[facility] [address] [place], [place], [address] Phone: [phone]   Fax:  [phone]  Physical Therapy Treatment  Patient Details  Name: Crystal [name] MRN: [id number] Date of Birth: 1946/03/19 Referring Provider (PT): [name], MD   Encounter Date: 04/16/2019  PT End of Session - 04/16/19 1144    Visit Number  7    Number of Visits  9    Date for PT Re-Evaluation  05/13/19    Authorization Type  BC[employer] Medicare; $40 copay.  10th visit PN.  No VL    PT Start Time  1102    PT Stop Time  1143    PT Time Calculation (min)  41 min    Equipment Utilized During Treatment  Gait belt    Activity Tolerance  Patient tolerated treatment well    Behavior During Therapy  WFL for tasks assessed/performed       Past Medical History:  Diagnosis Date  . Adenomatous colon polyp 2007 & 2010    Dr [name]  . Allergy    seasonal  . Arthritis   . Cataract   . Diverticulosis   . 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 Hyperlipidemia   . Hypertension   . IBS (irritable bowel syndrome)   . Legally blind   . MVP (mitral valve prolapse)   . Paroxysmal atrial fibrillation with rapid ventricular response ([facility]) 08/29/2017  . Peripheral vascular disease ([facility])   . RAD (reactive airway disease)     Past Surgical History:  Procedure Laterality Date  . ABDOMINAL HYSTERECTOMY     with USO for dysfunctionall menses  . APPENDECTOMY    . ARTERY BIOPSY Bilateral 07/21/2017   Procedure: BIOPSY BILATERAL TEMPORAL ARTERY;  Surgeon: [name], MD;  Location: [facility];  Service: Vascular;  Laterality: Bilateral;  . CARDIOVERSION N/A 06/16/2018   Procedure: CARDIOVERSION;  Surgeon: [name], MD;  Location: [facility];  Service: Cardiovascular;  Laterality: N/A;  . CATARACT EXTRACTION W/ INTRAOCULAR LENS IMPLANT  2013   bilateral; Dr [name]  . COLONOSCOPY  2013   negative , due 2018; Dr [name]  . COLONOSCOPY W/  POLYPECTOMY      X2 ; diverticulosis  . EMBOLECTOMY Right 08/29/2017   Procedure: Right Groin Exploration  ;  Surgeon: Rosetta Posner, MD;  Location: [facility];  Service: Vascular;  Laterality: Right;  . HIP PINNING,CANNULATED Right 03/21/2018  . HIP PINNING,CANNULATED Right 03/21/2018   Procedure: CANNULATED HIP PINNING;  Surgeon: Rod Can, MD;  Location: [facility];  Service: Orthopedics;  Laterality: Right;  . LUMBAR LAMINECTOMY  2000   Dr Vertell Limber  . SHOULDER SURGERY     R shoulder  . TONSILLECTOMY      There were no vitals filed for this visit.  Subjective Assessment - 04/16/19 1106    Subjective  Pt denied falls or changes since last visit. Pt states she only had one episode of dizziness since last session.    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  Yes    Pain Score  8     Pain Location  Shoulder    Pain Orientation  Left    Pain Descriptors / Indicators  Aching;Sore    Pain Type  Chronic pain    Pain Onset  More than a month ago    Pain Frequency  Constant    Aggravating Factors   Movement    Pain Relieving Factors  Rest, muscle relaxers             Vestibular Assessment - 04/16/19 1109      Horizontal Canal Right   Horizontal Canal Right Duration  <10 sec. with slight dizzines for 1-2 beats.    Horizontal Canal Right Symptoms  Geotrophic      Horizontal Canal Left   Horizontal Canal Left Duration  5 sec. with 2/10 dizziness.     Horizontal Canal Left Symptoms  Geotrophic               [facility] Adult PT Treatment/Exercise - 04/16/19 1117      Ambulation/Gait   Ambulation/Gait  Yes    Ambulation/Gait Assistance  4: Min guard    Ambulation/Gait Assistance Details  without HHA, but with min guard at pt's  hips. Cues to improve stride length and heel strike.    Ambulation Distance (Feet)  230 Feet   x2 and 130', and 75'   Assistive device  Other (Comment)   white cane   Gait Pattern  Step-through pattern;Decreased stride length;Decreased dorsiflexion - right;Decreased dorsiflexion - left    Ambulation Surface  Level;Indoor      High Level Balance   High Level Balance Activities  Head turns;Other (comment)   head nods   High Level Balance Comments  Performed at coutner with 1 UE support and min guard for safety 8 reps x10'/activity. Cues for technique. No dizziness reported but did report unsteadiness.       Vestibular Treatment/Exercise - 04/16/19 1116      Vestibular Treatment/Exercise   Vestibular Treatment Provided  Canalith Repositioning      Canal Roll Left   Number of Reps   1    Overall Response   Improved Symptoms    Response Details   Decr. dizziness      Two seated rest breaks during session 2/2 fatigue.            PT Long Term Goals - 04/12/19 1256      PT LONG TERM GOAL #1   Title  Pt will demonstrate ability to perform final HEP with supervision. ALL UNMET GOALS WILL BE CARRIED OVER TO NEW POC: (05/24/19)    Time  4    Period  Weeks    Status  Achieved      PT LONG TERM GOAL #2   Title  Pt will demonstrate negative positional testing for multiple canals    Time  6    Period  Weeks    Status  On-going      PT LONG TERM GOAL #3   Title  Pt will report no spinning when performing rolling in bed and during supine <> sit    Time  6    Period  Weeks    Status  On-going      PT LONG TERM GOAL #4   Title  Pt will demonstrate ability to ambulate x 115' over indoor surfaces with white cane and negotiate 8 stairs with rails and supervision    Time  6    Period  Weeks    Status  On-going            Plan - 04/16/19 1144    Clinical Impression Statement  Pt demonstrating progress as she tolerated  dynamic balance and gait activities during session and  reported decr. dizziness overall (2/10 vs. 5/10 last session). Pt continues to require min guard for safety and cues to improve stride length and heel strike. Continue with POC.    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, IBS, HTN, HLD, femoral artery thrombosis, diverticulosis, cataract, OA, R shoulder surgery, lumbar laminectomy, R cannulated hip surgery, and embolectomy    Examination-Activity Limitations  Bed Mobility;Locomotion Level;Stand;Transfers;Stairs    Examination-Participation Restrictions  Shop;Community Activity    Stability/Clinical Decision Making  Evolving/Moderate complexity    Rehab Potential  Good    PT Frequency  2x / week   requesting additonal visits   PT Duration  4 weeks    PT Treatment/Interventions  ADLs/Self Care Home Management;Canalith Repostioning;Gait training;Stair training;Functional mobility training;Therapeutic activities;Therapeutic exercise;Balance training;Neuromuscular re-education;Patient/family education;Vestibular    PT Next Visit Plan  PT IS COMPLETELY VISUALLY IMPAIRED - USES WHITE CANE.   Continue to assess for L Horizontal canal BPPV - treat if indicated. Continue to work on standing balance, gait without HHA.  Progress HEP    Consulted and Agree with Plan of Care  Patient;Family member/caregiver    Family Member Consulted  Husband       Patient will benefit from skilled therapeutic intervention in order to improve the following deficits and impairments:  Decreased balance, Difficulty walking, Dizziness  Visit Diagnosis: BPPV (benign paroxysmal positional vertigo), left  Dizziness and giddiness  Unsteadiness on feet  Difficulty in walking, not elsewhere classified  Repeated falls     Problem List Patient Active Problem List   Diagnosis Date Noted  .  Vertigo of central origin 02/09/2019  . Other peripheral vertigo, unspecified ear 02/09/2019  . Nonintractable headache 02/09/2019  . Community acquired pneumonia 02/05/2019  . Hypothermia 02/05/2019  . CAP (community acquired pneumonia) 02/05/2019  . Pruritus 01/02/2019  . Rib pain on left side 08/09/2018  . Hypokalemia 08/09/2018  . Long term (current) use of anticoagulants 08/01/2018  . Pre-operative clearance 08/01/2018  . Closed compression fracture of L4 vertebra ([facility]) 07/11/2018  . Bilateral leg edema 07/11/2018  . Lung nodule < 6cm on CT 07/11/2018  . Subclinical hypothyroidism 04/11/2018  . Fracture of femoral neck, right ([facility]) 03/22/2018  . PAF (paroxysmal atrial fibrillation) ([facility]) 03/18/2018  . 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 Herpes zoster without complication [id number]  . Jaw claudication 07/27/2017  . Temporal arteritis ([facility]) 07/18/2017  . Vision, loss, sudden, bilateral 07/18/2017  . 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 Osteoarthritis 06/25/2016  . Anxiety 06/25/2016  . Back pain 07/18/2015  . 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 Carotid bruit present 06/05/2010  . Osteoporosis 06/03/2008  . History of colonic polyps 06/03/2008  . INTENTION TREMOR 12/06/2007  . IBS 10/16/2007  . Hyperlipidemia 06/06/2007  . Essential hypertension 06/06/2007  . Chronic obstructive airway disease with asthma ([facility]) 02/23/2007    Jerzie Bieri L 04/16/2019, 11:47 AM  [facility] [address] [place], [place], [address] Phone: [phone]   Fax:  [phone]  Name: Crystal [name] MRN: [id number] Date of Birth: [birth year]  Geoffry Paradise, PT,DPT 04/16/19 11:47 AM Phone: [phone] Fax: [phone]

## 2019-04-23 ENCOUNTER — Other Ambulatory Visit: Payer: Self-pay

## 2019-04-23 ENCOUNTER — Ambulatory Visit: Payer: Medicare Other

## 2019-04-23 DIAGNOSIS — H8111 Benign paroxysmal vertigo, right ear: Secondary | ICD-10-CM | POA: Diagnosis not present

## 2019-04-23 DIAGNOSIS — R42 Dizziness and giddiness: Secondary | ICD-10-CM

## 2019-04-23 DIAGNOSIS — H8112 Benign paroxysmal vertigo, left ear: Secondary | ICD-10-CM | POA: Diagnosis not present

## 2019-04-23 DIAGNOSIS — R296 Repeated falls: Secondary | ICD-10-CM | POA: Diagnosis not present

## 2019-04-23 DIAGNOSIS — R2681 Unsteadiness on feet: Secondary | ICD-10-CM

## 2019-04-23 DIAGNOSIS — R262 Difficulty in walking, not elsewhere classified: Secondary | ICD-10-CM | POA: Diagnosis not present
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
## 2019-05-01 ENCOUNTER — Other Ambulatory Visit: Payer: Self-pay

## 2019-05-01 MED ORDER — THEOPHYLLINE ER 400 MG PO TB24: 200.0000 mg | ORAL_TABLET | Freq: Two times a day (BID) | ORAL | 1 refills | Status: DC

## 2019-05-04 ENCOUNTER — Ambulatory Visit: Payer: Medicare Other | Admitting: Physical Therapy

## 2019-05-09 ENCOUNTER — Encounter: Payer: Self-pay | Admitting: Physical Therapy

## 2019-05-09 ENCOUNTER — Ambulatory Visit: Payer: Medicare Other | Admitting: Physical Therapy

## 2019-05-09 ENCOUNTER — Other Ambulatory Visit: Payer: Self-pay

## 2019-05-09 DIAGNOSIS — R2681 Unsteadiness on feet: Secondary | ICD-10-CM | POA: Diagnosis not present

## 2019-05-09 DIAGNOSIS — H8112 Benign paroxysmal vertigo, left ear: Secondary | ICD-10-CM | POA: Diagnosis not present

## 2019-05-09 DIAGNOSIS — R296 Repeated falls: Secondary | ICD-10-CM | POA: Diagnosis not present

## 2019-05-09 DIAGNOSIS — R42 Dizziness and giddiness: Secondary | ICD-10-CM | POA: Diagnosis not present

## 2019-05-09 DIAGNOSIS — R262 Difficulty in walking, not elsewhere classified: Secondary | ICD-10-CM | POA: Diagnosis not present

## 2019-05-09 DIAGNOSIS — H8111 Benign paroxysmal vertigo, right ear: Secondary | ICD-10-CM | POA: Diagnosis not present

## 2019-05-09 NOTE — Therapy (Addendum)
[facility] [address] [place], [place], [address] Phone: [phone]   Fax:  [phone]  Physical Therapy Treatment and 10th visit Progress Note  Patient Details  Name: Crystal [name] MRN: [id number] Date of Birth: 05/15/1946 Referring Provider (PT): [name], MD   Encounter Date: 05/09/2019   Progress Note  Reporting Period 03/13/2019 to 05/09/2019  See note below for Objective Data and Assessment of Progress/Goals.     PT End of Session - 05/09/19 1222    Visit Number  9   10th visit note done on visit 9; due again visit 19   Number of Visits  13    Date for PT Re-Evaluation  06/12/19    Authorization Type  [employer] Medicare; $40 copay.  10th visit PN.  No VL    PT Start Time  1112    PT Stop Time  1152    PT Time Calculation (min)  40 min    Equipment Utilized During Treatment  Other (comment)   min guard to min A   Activity Tolerance  Patient tolerated treatment well    Behavior During Therapy  WFL for tasks assessed/performed       Past Medical History:  Diagnosis Date  . Adenomatous colon polyp 2007 & 2010    Dr [name]  . Allergy    seasonal  . Arthritis   . Cataract   . Diverticulosis   . 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 Hyperlipidemia   . Hypertension   . IBS (irritable bowel syndrome)   . Legally blind   . MVP (mitral valve prolapse)   . Paroxysmal atrial fibrillation with rapid ventricular response ([facility]) 08/29/2017  . Peripheral vascular disease ([place])   . RAD (reactive airway disease)     Past Surgical History:  Procedure Laterality Date  . ABDOMINAL HYSTERECTOMY     with USO for dysfunctionall menses  . APPENDECTOMY    . ARTERY BIOPSY Bilateral 07/21/2017   Procedure: BIOPSY BILATERAL TEMPORAL ARTERY;  Surgeon: [name], MD;  Location: [facility];  Service: Vascular;  Laterality: Bilateral;  . CARDIOVERSION N/A 06/16/2018   Procedure: CARDIOVERSION;   Surgeon: [name], MD;  Location: [facility];  Service: Cardiovascular;  Laterality: N/A;  . CATARACT EXTRACTION W/ INTRAOCULAR LENS IMPLANT  2013   bilateral; Dr [name]  . COLONOSCOPY  2013   negative , due 2018; Dr [name]  . COLONOSCOPY W/ POLYPECTOMY      X2 ; diverticulosis  . EMBOLECTOMY Right 08/29/2017   Procedure: Right Groin Exploration  ;  Surgeon: Rosetta Posner, MD;  Location: [facility];  Service: Vascular;  Laterality: Right;  . HIP PINNING,CANNULATED Right 03/21/2018  . HIP PINNING,CANNULATED Right 03/21/2018   Procedure: CANNULATED HIP PINNING;  Surgeon: Rod Can, MD;  Location: [facility];  Service: Orthopedics;  Laterality: Right;  . LUMBAR LAMINECTOMY  2000   Dr Vertell Limber  . SHOULDER SURGERY     R shoulder  . TONSILLECTOMY      There were no vitals filed for this visit.  Subjective Assessment - 05/09/19 1115    Subjective  No falls since the one in the bathroom.  Still having some symptoms in the bed but better overall.    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, IBS, HTN, HLD, femoral artery thrombosis, diverticulosis, cataract, OA, R shoulder surgery, lumbar laminectomy, R cannulated hip surgery, and embolectomy      Prior  Function   Level of Independence  Independent with household mobility with device;Independent with basic ADLs;Requires assistive device for independence;Independent with transfers      Observation/Other Assessments   Focus on Therapeutic Outcomes (FOTO)   N/A      Standardized Balance Assessment   Standardized Balance Assessment  Berg Balance Test      Berg Balance Test   Sit to Stand  Needs minimal aid to stand or to stabilize    Standing Unsupported  Able to stand 2 minutes with supervision    Sitting with Back Unsupported but Feet Supported on Floor or Stool  Able to sit safely and securely 2 minutes    Stand to Sit  Controls descent by using hands    Transfers  Needs one person to assist    Standing Unsupported with Eyes Closed  Able to stand 10 seconds with supervision    Standing Unsupported with Feet Together  Needs help to attain position but able to stand for 30 seconds with feet together    From Standing, Reach Forward with Outstretched Arm  Reaches forward but needs supervision    From Standing Position, Pick up Object from Floor  Able to pick up shoe, needs supervision    From Standing Position, Turn to Look Behind Over each Shoulder  Needs supervision when turning    Turn 360 Degrees  Needs close supervision or verbal cueing    Standing Unsupported, Alternately Place Feet on Step/Stool  Able to complete >2 steps/needs minimal assist    Standing Unsupported, One Foot in Front  Needs help to step but can hold 15 seconds    Standing on One Leg  Tries to lift leg/unable to hold 3 seconds but remains standing independently    Total Score  25    Berg comment:  25/56 high falls risk                        Balance Exercises - 05/09/19 1209      Balance Exercises: Standing   Standing Eyes Closed  Narrow base of support (BOS);Head turns;Solid surface;Other reps (comment)   10 REPS   Other Standing Exercises  With feet apart performed limits of stability training  on solid surface with min A: laterally L/R x 10 reps and then anterior posterior x 10 reps.  Also had pt take small step forwards and perform diagonal weight shifting in staggered stance x 5 reps each side        PT Education - 05/09/19 1221    Education Details  will schedule four more visits to continue to address balance; clinical findings of BERG, adjusted HEP  Person(s) Educated  Patient;Spouse    Methods  Explanation;Demonstration;Handout    Comprehension  Verbalized understanding;Returned demonstration          PT Long Term Goals - 05/09/19 1117      PT LONG TERM GOAL #1   Title  Pt will demonstrate ability to perform final HEP with supervision. ALL UNMET GOALS WILL BE CARRIED OVER TO NEW POC: (05/24/19)    Time  4    Period  Weeks    Status  Achieved      PT LONG TERM GOAL #2   Title  Pt will demonstrate negative positional testing for multiple canals    Time  6    Period  Weeks    Status  On-going      PT LONG TERM GOAL #3   Title  Pt will report no spinning when performing rolling in bed and during supine <> sit    Time  6    Period  Weeks    Status  On-going      PT LONG TERM GOAL #4   Title  Pt will demonstrate ability to ambulate x 115' over indoor surfaces with white cane and negotiate 8 stairs with rails and supervision    Time  6    Period  Weeks    Status  Not Met        New goals for recert: PT Long Term Goals - 05/09/19 1117      PT LONG TERM GOAL #1   Title  Pt will demonstrate ability to perform final HEP with supervision.    Time  4    Period  Weeks    Status  Revised    Target Date  06/08/19      PT LONG TERM GOAL #2   Title  Pt will demonstrate negative positional testing for multiple canals    Time  4    Period  Weeks    Status  Revised    Target Date  06/08/19      PT LONG TERM GOAL #3   Title  Pt will report no spinning when performing rolling in bed and during supine <> sit    Time  4    Period  Weeks    Status  Revised     Target Date  06/08/19      PT LONG TERM GOAL #4   Title  Pt will demonstrate ability to ambulate x 115' over indoor surfaces with white cane and negotiate 8 stairs with rails and supervision    Time  4    Period  Weeks    Status  Revised    Target Date  06/08/19      PT LONG TERM GOAL #5   Title  Pt will decrease falls risk as indicated by increase in BERG by 4 points.    Baseline  25/56    Time  4    Period  Weeks    Status  New    Target Date  06/08/19            Plan - 05/09/19 1230    Clinical Impression Statement  Patient continues to demonstrate improvements in dizziness with positional changes but continues to demonstrate significant impairments in standing balance and continues to require husband's HHA for safe ambulation.  Pt is at high risk for falls as indicated by BERG performed today.  Pt will benefit from continued skilled PT services to address these impairments to maximize functional  mobility independence and decrease falls risk.    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, IBS, HTN, HLD, femoral artery thrombosis, diverticulosis, cataract, OA, R shoulder surgery, lumbar laminectomy, R cannulated hip surgery, and embolectomy    Examination-Activity Limitations  Bed Mobility;Locomotion Level;Stand;Transfers;Stairs    Examination-Participation Restrictions  Shop;Community Activity    Rehab Potential  Good    PT Frequency  1x / week    PT Duration  4 weeks    PT Treatment/Interventions  ADLs/Self Care Home Management;Canalith Repostioning;Gait training;Stair training;Functional mobility training;Therapeutic activities;Therapeutic exercise;Balance training;Neuromuscular re-education;Patient/family education;Vestibular    PT Next Visit Plan  PT IS COMPLETELY VISUALLY IMPAIRED - USES  WHITE CANE.   10th visit done on 9th visit due to adding visits and extending POC.  Shift focus to standing balance training - limits of stability, balance reactions, decreasing reliance on UE and HHA for gait; Continue to assess for L Horizontal canal BPPV - treat if indicated.    Consulted and Agree with Plan of Care  Patient;Family member/caregiver    Family Member Consulted  Husband       Patient will benefit from skilled therapeutic intervention in order to improve the following deficits and impairments:  Decreased balance, Difficulty walking, Dizziness  Visit Diagnosis: BPPV (benign paroxysmal positional vertigo), left  Dizziness and giddiness  Unsteadiness on feet  Difficulty in walking, not elsewhere classified  Repeated falls  BPPV (benign paroxysmal positional vertigo), right     Problem List Patient Active Problem List   Diagnosis Date Noted  . Vertigo of central origin 02/09/2019  . Other peripheral vertigo, unspecified ear 02/09/2019  . Nonintractable headache 02/09/2019  . Community acquired pneumonia 02/05/2019  . Hypothermia 02/05/2019  . CAP (community acquired pneumonia) 02/05/2019  . Pruritus 01/02/2019  . Rib pain on left side 08/09/2018  . Hypokalemia 08/09/2018  . Long term (current) use of anticoagulants 08/01/2018  . Pre-operative clearance 08/01/2018  . Closed compression fracture of L4 vertebra ([facility]) 07/11/2018  . Bilateral leg edema 07/11/2018  . Lung nodule < 6cm on CT 07/11/2018  . Subclinical hypothyroidism 04/11/2018  . Fracture of femoral neck, right ([place]) 03/22/2018  . PAF (paroxysmal atrial fibrillation) ([place]) 03/18/2018  . 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 Herpes zoster without complication 76/16/0737  . Jaw claudication 07/27/2017  . Temporal arteritis ([facility]) 07/18/2017  . Vision, loss, sudden, bilateral 07/18/2017  . 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 Osteoarthritis 06/25/2016  . Anxiety 06/25/2016  . Back pain  07/18/2015  . 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 Carotid bruit present 06/05/2010  . Osteoporosis 06/03/2008  . History of colonic polyps 06/03/2008  . INTENTION TREMOR 12/06/2007  . IBS 10/16/2007  . Hyperlipidemia 06/06/2007  . Essential hypertension 06/06/2007  . Chronic obstructive airway disease with asthma ([facility]) 02/23/2007    Rico Junker, PT, DPT 05/09/19    7:52 PM    [facility] [address] [place], [place], [address] Phone: [phone]   Fax:  [phone]  Name: AREYA LEMMERMAN MRN: [id number] Date of Birth: [birth year]

## 2019-05-09 NOTE — Patient Instructions (Signed)
Habituation - Tip Card  1.The goal of habituation training is to assist in decreasing symptoms of vertigo, dizziness, or nausea provoked by specific head and body motions. 2.These exercises may initially increase symptoms; however, be persistent and work through symptoms. With repetition and time, the exercises will assist in reducing or eliminating symptoms. 3.Exercises should be stopped and discussed with the therapist if you experience any of the following: - Sudden change or fluctuation in hearing - New onset of ringing in the ears, or increase in current intensity - Any fluid discharge from the ear - Severe pain in neck or back - Extreme nausea  Copyright  VHI. All rights reserved.  Habituation - Rolling   With pillow under head, start on back. Roll to your right side. Hold until dizziness stops, plus 20 seconds and then roll to the left side. Hold until dizziness stops, plus 20 seconds. Repeat sequence 5 times per session. Do 2 sessions per day.      Walking: Phase 1    Stand next to your counter, one hand on the counter. Walk forwards slowly taking a full step with each foot, try to keep your balance in the middle. Repeat 4 times.   Weight Shift: Anterior / Posterior (Limits of Stability)    Slowly shift weight backward until the weight is in your heels, Return to starting position. Shift weight slowly forward until you feel the weight in your toes. Repeat _10-12___ times per session. Do __2__ sessions per day.    Weight Shift: Lateral (Limits of Stability)    Slowly shift weight to right as far as possible leading with your hips, you should feel some weight come off your left foot. Return to starting position. Shift to opposite side.  Repeat __10-12__ times per session. Do __2__ sessions per day.    Feet Together, Head Motion - Eyes Closed    With eyes closed and feet together, move head slowly, up and down 10 TIMES AND SIDE TO SIDE 10 TIMES.  Repeat __2__ times per session. Do _2___ sessions per day.    Habituation - Sit to Side-Lying   Sit on edge of bed. Lie down onto the right side and hold until dizziness stops, plus 20 seconds.  Return to sitting and wait until dizziness stops, plus 20 seconds.  Repeat to the left side. Repeat sequence 5 times per session. Do 2 sessions per day.  Copyright  VHI. All rights reserved.

## 2019-05-18 ENCOUNTER — Ambulatory Visit: Payer: Medicare Other | Attending: Internal Medicine

## 2019-05-18 ENCOUNTER — Other Ambulatory Visit: Payer: Self-pay

## 2019-05-18 DIAGNOSIS — H8112 Benign paroxysmal vertigo, left ear: Secondary | ICD-10-CM | POA: Diagnosis not present

## 2019-05-18 DIAGNOSIS — R296 Repeated falls: Secondary | ICD-10-CM | POA: Diagnosis not present

## 2019-05-18 DIAGNOSIS — R262 Difficulty in walking, not elsewhere classified: Secondary | ICD-10-CM | POA: Insufficient documentation

## 2019-05-18 DIAGNOSIS — R2681 Unsteadiness on feet: Secondary | ICD-10-CM | POA: Insufficient documentation

## 2019-05-18 DIAGNOSIS — R42 Dizziness and giddiness: Secondary | ICD-10-CM | POA: Diagnosis not present

## 2019-05-18 DIAGNOSIS — H8111 Benign paroxysmal vertigo, right ear: Secondary | ICD-10-CM | POA: Insufficient documentation

## 2019-05-18 NOTE — Patient Instructions (Signed)
Weight Shift: Lateral (Limits of Stability)    PERFORM IN CORNER WITH CHAIR IN FRONT. Slowly shift weight to right as far as possible, without taking a step. Return to MIDDLE. Shift to opposite side. Hold each position __2__ seconds. Repeat _10___ times per session. Do __1__ sessions per day.   Copyright  VHI. All rights reserved.  Weight Shift: Anterior / Posterior (Limits of Stability)    PERFORM IN CORNER WITH CHAIR IN FRONT OF YOU. Slowly shift weight backward until toes begin to rise off floor. Return to starting position (MIDDLE). Shift weight slowly forward until heels begin to rise off floor. Hold each position _2___ seconds. Repeat __10__ times per session. Do __1__ sessions per day. Copyright  VHI. All rights reserved.   WALL BUMPS: Start with back and hips against a wall and chair in front of you for safety. Return to standing by moving hips forward and then back off wall. Repeat 10 times.

## 2019-05-18 NOTE — Therapy (Signed)
[facility] [address] [place], [place], [address] Phone: [phone]   Fax:  [phone]  Physical Therapy Treatment  Patient Details  Name: Crystal [name] MRN: [id number] Date of Birth: 1945/07/04 Referring Provider (PT): [name], MD   Encounter Date: 05/18/2019  PT End of Session - 05/18/19 1542    Visit Number  10    Number of Visits  13    Date for PT Re-Evaluation  06/12/19    Authorization Type  BC[employer] Medicare; $40 copay.  10th visit PN.  No VL    PT Start Time  1449    PT Stop Time  1532    PT Time Calculation (min)  43 min    Equipment Utilized During Treatment  Other (comment)   min guard to S prn   Activity Tolerance  Patient tolerated treatment well    Behavior During Therapy  WFL for tasks assessed/performed       Past Medical History:  Diagnosis Date  . Adenomatous colon polyp 2007 & 2010    Dr [name]  . Allergy    seasonal  . Arthritis   . Cataract   . Diverticulosis   . Femoral artery thrombosis, right ([facility] [place]) 09/01/2017  . Hyperlipidemia   . Hypertension   . IBS (irritable bowel syndrome)   . Legally blind   . MVP (mitral valve prolapse)   . Paroxysmal atrial fibrillation with rapid ventricular response ([facility]) 08/29/2017  . Peripheral vascular disease ([place])   . RAD (reactive airway disease)     Past Surgical History:  Procedure Laterality Date  . ABDOMINAL HYSTERECTOMY     with USO for dysfunctionall menses  . APPENDECTOMY    . ARTERY BIOPSY Bilateral 07/21/2017   Procedure: BIOPSY BILATERAL TEMPORAL ARTERY;  Surgeon: [name], MD;  Location: [facility];  Service: Vascular;  Laterality: Bilateral;  . CARDIOVERSION N/A 06/16/2018   Procedure: CARDIOVERSION;  Surgeon: [name], MD;  Location: [facility];  Service: Cardiovascular;  Laterality: N/A;  . CATARACT EXTRACTION W/ INTRAOCULAR LENS IMPLANT  2013   bilateral; Dr [name]  . COLONOSCOPY  2013   negative , due  2018; Dr [name]  . COLONOSCOPY W/ POLYPECTOMY      X2 ; diverticulosis  . EMBOLECTOMY Right 08/29/2017   Procedure: Right Groin Exploration  ;  Surgeon: Rosetta Posner, MD;  Location: [facility];  Service: Vascular;  Laterality: Right;  . HIP PINNING,CANNULATED Right 03/21/2018  . HIP PINNING,CANNULATED Right 03/21/2018   Procedure: CANNULATED HIP PINNING;  Surgeon: Rod Can, MD;  Location: [facility];  Service: Orthopedics;  Laterality: Right;  . LUMBAR LAMINECTOMY  2000   Dr Vertell Limber  . SHOULDER SURGERY     R shoulder  . TONSILLECTOMY      There were no vitals filed for this visit.  Subjective Assessment - 05/18/19 1454    Subjective  Pt denied falls or changes since last visit. Pt reported dizziness is still present occasionally while in bed but states it's more of a lightheadedness.    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  No/denies                       [facility] Adult PT Treatment/Exercise - 05/18/19 1548      Ambulation/Gait   Ambulation/Gait  Yes    Ambulation/Gait Assistance  4: Min guard    Ambulation/Gait Assistance Details  with and without HHA, with white cane. Pt able to progress stride length towards end of session.     Ambulation Distance (Feet)  100 Feet   , 30'x2, 10'x2   Assistive device  Other (Comment)   white cane   Gait Pattern  Step-through pattern;Decreased stride length;Decreased dorsiflexion - right;Decreased dorsiflexion - left    Ambulation Surface  Level;Indoor          Balance Exercises - 05/18/19 1549      Balance Exercises: Standing   Wall  Bumps  Hip    Wall Bumps-Hips  Eyes opened;Anterior/posterior;10 reps   2 sets   Rockerboard  Anterior/posterior;Head turns;EO;30 seconds;10 reps   Pt is blind.    Other Standing Exercises  In corner with chair in front of pt for safety: min guard to S for safety. Tactile and verbal cues to improve technique. Pt performed ant/post/lat limits of stability exercises 2x10reps. Provided as HEP.        PT Education - 05/18/19 1538    Education Details  PT provided pt with updated balance HEP (limits of stability and wall bumps).    Person(s) Educated  Patient;Spouse    Methods  Explanation;Tactile cues;Verbal cues;Handout    Comprehension  Returned demonstration;Verbalized understanding;Need further instruction          PT Long Term Goals - 05/09/19 1117      PT LONG TERM GOAL #1   Title  Pt will demonstrate ability to perform final HEP with supervision.    Time  4    Period  Weeks    Status  Revised    Target Date  06/08/19      PT LONG TERM GOAL #2   Title  Pt will demonstrate negative positional testing for multiple canals    Time  4    Period  Weeks    Status  Revised    Target Date  06/08/19      PT LONG TERM GOAL #3   Title  Pt will report no spinning when performing rolling in bed and during supine <> sit    Time  4    Period  Weeks    Status  Revised    Target Date  06/08/19      PT LONG TERM GOAL #4   Title  Pt will demonstrate ability to ambulate x 115' over indoor surfaces with white cane and negotiate 8 stairs with rails and supervision    Time  4    Period  Weeks    Status  Revised    Target Date  06/08/19      PT LONG TERM GOAL #5   Title  Pt will decrease falls risk as indicated by increase in BERG by 4 points.    Baseline  25/56    Time  4    Period  Weeks    Status  New    Target Date  06/08/19            Plan - 05/18/19 1543    Clinical Impression Statement  Today's skilled session focused on improving pt's righting reactions and  awareness of limits of stability. Pt  required extensive tactile cues and verbal cues during first 10 reps of activity and progressed to minimal cues, therefore, PT added balance activities to HEP. Pt progressed from min guard with B UE support while on short rockerboard to no UE support and min guard or 1UE support and S. Pt did require two seated rest breaks during session 2/2 fatigue. Pt would continue to benefit from skilled PT to improve safety during funcional mobility.    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, IBS, HTN, HLD, femoral artery thrombosis, diverticulosis, cataract, OA, R shoulder surgery, lumbar laminectomy, R cannulated hip surgery, and embolectomy    Examination-Activity Limitations  Bed Mobility;Locomotion Level;Stand;Transfers;Stairs    Examination-Participation Restrictions  Shop;Community Activity    Rehab Potential  Good    PT Frequency  1x / week    PT Duration  4 weeks    PT Treatment/Interventions  ADLs/Self Care Home Management;Canalith Repostioning;Gait training;Stair training;Functional mobility training;Therapeutic activities;Therapeutic exercise;Balance training;Neuromuscular re-education;Patient/family education;Vestibular    PT Next Visit Plan  PT IS COMPLETELY VISUALLY IMPAIRED - USES WHITE CANE.   Review limits of stability, balance reactions added to HEP last visit prn, continue to work on decreasing reliance on UE and HHA for gait; Continue to assess for L Horizontal canal BPPV - treat if indicated.    Consulted and Agree with Plan of Care  Patient;Family member/caregiver    Family Member Consulted  Husband       Patient will benefit from skilled therapeutic intervention in order to improve the following deficits and impairments:  Decreased balance, Difficulty walking,  Dizziness  Visit Diagnosis: Unsteadiness on feet  Dizziness and giddiness  Difficulty in walking, not elsewhere classified     Problem List Patient Active Problem List   Diagnosis Date Noted  . Vertigo of central origin 02/09/2019  . Other peripheral vertigo, unspecified ear 02/09/2019  . Nonintractable headache 02/09/2019  . Community acquired pneumonia 02/05/2019  . Hypothermia 02/05/2019  . CAP (community acquired pneumonia) 02/05/2019  . Pruritus 01/02/2019  . Rib pain on left side 08/09/2018  . Hypokalemia 08/09/2018  . Long term (current) use of anticoagulants 08/01/2018  . Pre-operative clearance 08/01/2018  . Closed compression fracture of L4 vertebra ([facility]) 07/11/2018  . Bilateral leg edema 07/11/2018  . Lung nodule < 6cm on CT 07/11/2018  . Subclinical hypothyroidism 04/11/2018  . Fracture of femoral neck, right ([facility]) 03/22/2018  . PAF (paroxysmal atrial fibrillation) ([facility]) 03/18/2018  . Femoral artery thrombosis, right ([facility]) 09/01/2017  . Herpes zoster without complication [id number]  . Jaw claudication 07/27/2017  . Temporal arteritis ([facility]) 07/18/2017  . Vision, loss, sudden, bilateral 07/18/2017  . 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 Osteoarthritis 06/25/2016  . Anxiety 06/25/2016  . Back pain 07/18/2015  . 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 Carotid bruit present 06/05/2010  . Osteoporosis 06/03/2008  . History of colonic polyps 06/03/2008  . INTENTION TREMOR 12/06/2007  . IBS 10/16/2007  . Hyperlipidemia 06/06/2007  . Essential hypertension 06/06/2007  . Chronic obstructive airway disease with asthma ([facility]) 02/23/2007    Bradlee Heitman L 05/18/2019, 3:52 PM  [facility] [address] [place], [place], [address] Phone: [phone]   Fax:  [phone]  Name: Crystal [name] MRN: [id number] Date of Birth: 05-22-1946  Geoffry Paradise, PT,DPT 05/18/19 3:52 PM Phone: [phone] Fax: [phone]

## 2019-05-25 ENCOUNTER — Encounter: Payer: Self-pay | Admitting: Physical Therapy

## 2019-05-25 ENCOUNTER — Ambulatory Visit: Payer: Medicare Other | Admitting: Physical Therapy

## 2019-05-25 ENCOUNTER — Other Ambulatory Visit: Payer: Self-pay

## 2019-05-25 DIAGNOSIS — R42 Dizziness and giddiness: Secondary | ICD-10-CM | POA: Diagnosis not present

## 2019-05-25 DIAGNOSIS — H8111 Benign paroxysmal vertigo, right ear: Secondary | ICD-10-CM | POA: Diagnosis not present

## 2019-05-25 DIAGNOSIS — H8112 Benign paroxysmal vertigo, left ear: Secondary | ICD-10-CM | POA: Diagnosis not present

## 2019-05-25 DIAGNOSIS — R296 Repeated falls: Secondary | ICD-10-CM

## 2019-05-25 DIAGNOSIS — R262 Difficulty in walking, not elsewhere classified: Secondary | ICD-10-CM

## 2019-05-25 DIAGNOSIS — R2681 Unsteadiness on feet: Secondary | ICD-10-CM | POA: Diagnosis not present

## 2019-05-25 NOTE — Therapy (Signed)
[facility] [address] [place], [place], [address] Phone: [phone]   Fax:  [phone]  Physical Therapy Treatment  Patient Details  Name: Crystal [name] MRN: [id number] Date of Birth: 16-Aug-1945 Referring Provider (PT): [name], MD   Encounter Date: 05/25/2019  PT End of Session - 05/25/19 0955    Visit Number  11    Number of Visits  13    Date for PT Re-Evaluation  06/12/19    Authorization Type  [employer] Medicare; $40 copay.  10th visit PN.  No VL    PT Start Time  0800    PT Stop Time  0852    PT Time Calculation (min)  52 min    Equipment Utilized During Treatment  Other (comment)   min guard to S prn   Activity Tolerance  Patient tolerated treatment well    Behavior During Therapy  [facility] for tasks assessed/performed       Past Medical History:  Diagnosis Date  . Adenomatous colon polyp 2007 & 2010    Dr [name]  . Allergy    seasonal  . Arthritis   . Cataract   . Diverticulosis   . Femoral artery thrombosis, right ([facility]) 09/01/2017  . Hyperlipidemia   . Hypertension   . IBS (irritable bowel syndrome)   . Legally blind   . MVP (mitral valve prolapse)   . Paroxysmal atrial fibrillation with rapid ventricular response ([facility]) 08/29/2017  . Peripheral vascular disease ([facility])   . RAD (reactive airway disease)     Past Surgical History:  Procedure Laterality Date  . ABDOMINAL HYSTERECTOMY     with USO for dysfunctionall menses  . APPENDECTOMY    . ARTERY BIOPSY Bilateral 07/21/2017   Procedure: BIOPSY BILATERAL TEMPORAL ARTERY;  Surgeon: [name], MD;  Location: [facility];  Service: Vascular;  Laterality: Bilateral;  . CARDIOVERSION N/A 06/16/2018   Procedure: CARDIOVERSION;  Surgeon: [name], MD;  Location: [facility];  Service: Cardiovascular;  Laterality: N/A;  . CATARACT EXTRACTION W/ INTRAOCULAR LENS IMPLANT  2013   bilateral; Dr [name]  . COLONOSCOPY  2013   negative , due  2018; Dr [name]  . COLONOSCOPY W/ POLYPECTOMY      X2 ; diverticulosis  . EMBOLECTOMY Right 08/29/2017   Procedure: Right Groin Exploration  ;  Surgeon: Rosetta Posner, MD;  Location: [facility];  Service: Vascular;  Laterality: Right;  . HIP PINNING,CANNULATED Right 03/21/2018  . HIP PINNING,CANNULATED Right 03/21/2018   Procedure: CANNULATED HIP PINNING;  Surgeon: Rod Can, MD;  Location: [facility];  Service: Orthopedics;  Laterality: Right;  . LUMBAR LAMINECTOMY  2000   Dr Vertell Limber  . SHOULDER SURGERY     R shoulder  . TONSILLECTOMY      There were no vitals filed for this visit.  Subjective Assessment - 05/25/19 0808    Subjective  Still having a little dizziness in the morning; this morning had more significant dizziness.  Had one balance episode - stood up after pedicure with flip flops on and felt like she was going to go backwards.  Friend told pt she thought she was walking better.  New exercises are going well.    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, IBS, HTN, HLD, femoral artery thrombosis, diverticulosis, cataract, OA, R shoulder surgery, lumbar  laminectomy, R cannulated hip surgery, and embolectomy    Patient Stated Goals  To have relief from dizziness and improve balance             Vestibular Assessment - 05/25/19 0001      Positional Testing   Dix-Hallpike  Dix-Hallpike Left    Sidelying Test  Sidelying Right;Sidelying Left      Dix-Hallpike Left   Dix-Hallpike Left Duration  5 seconds vertigo    Dix-Hallpike Left Symptoms  Downbeat, left rotatory nystagmus      Sidelying Right   Sidelying Right Duration  0    Sidelying Right Symptoms  No nystagmus      Sidelying Left   Sidelying Left Duration  5 seconds    Sidelying Left Symptoms  Downbeat, left rotatory nystagmus                Vestibular  Treatment/Exercise - 05/25/19 0829      Vestibular Treatment/Exercise   Vestibular Treatment Provided  Canalith Repositioning    Canalith Repositioning  Epley Manuever Left;Comment   deep head hang manuever     OTHER   Comment  Performed deep head hanging manuever due to downbeating nystagmus after performing one CRM - during deep head hang pt experienced a 10 second latency in onset of nystagmus and vertigo.  Treated with deep head hang.        Walking: Phase 1    Stand next to your counter, one hand on the counter. Walk forwards slowly taking a full step with each foot, try to keep your balance in the middle. Repeat 4 times.   Weight Shift: Anterior / Posterior (Limits of Stability)    Slowly shift weight backward until the weight is in your heels, Return to starting position. Shift weight slowly forward until you feel the weight in your toes. Repeat _10-12___ times per session. Do __2__ sessions per day.    Weight Shift: Lateral (Limits of Stability)    Slowly shift weight to right as far as possible leading with your hips, you should feel some weight come off your left foot. Return to starting position. Shift to opposite side.  Repeat __10-12__ times per session. Do __2__ sessions per day.     Staggered Stance    Stand with one foot forwards, one foot back (staggered stance).  One hand touching counter if needed.  Focus on shifting your weight from the front foot to the back foot in a slow, controlled manner.  Perform 8 repetitions with the right foot forwards and then 8 with the left foot forwards.   Side-Stepping    Walk to left side with eyes open. Take even steps, leading with same foot. Make sure each foot lifts off the floor. Repeat in opposite direction. Perform 4 laps to the left and to the right.   WALL BUMPS: Start with back and hips against a wall and chair in front of you for safety. Return to standing by moving hips forward and then back off  wall. Repeat 10 times.        PT Education - 05/25/19 0954    Education Details  ongoing BPPV, updated HEP - removed habituation, progressed corner balance and added side stepping at counter    Person(s) Educated  Patient;Spouse    Methods  Explanation;Demonstration;Handout    Comprehension  Verbalized understanding;Returned demonstration          PT Long Term Goals - 05/09/19 1117      PT LONG TERM  GOAL #1   Title  Pt will demonstrate ability to perform final HEP with supervision.    Time  4    Period  Weeks    Status  Revised    Target Date  06/08/19      PT LONG TERM GOAL #2   Title  Pt will demonstrate negative positional testing for multiple canals    Time  4    Period  Weeks    Status  Revised    Target Date  06/08/19      PT LONG TERM GOAL #3   Title  Pt will report no spinning when performing rolling in bed and during supine <> sit    Time  4    Period  Weeks    Status  Revised    Target Date  06/08/19      PT LONG TERM GOAL #4   Title  Pt will demonstrate ability to ambulate x 115' over indoor surfaces with white cane and negotiate 8 stairs with rails and supervision    Time  4    Period  Weeks    Status  Revised    Target Date  06/08/19      PT LONG TERM GOAL #5   Title  Pt will decrease falls risk as indicated by increase in BERG by 4 points.    Baseline  25/56    Time  4    Period  Weeks    Status  New    Target Date  06/08/19            Plan - 05/25/19 0955    Clinical Impression Statement  Due to ongoing reports of dizziness with supine <> sit and lack of change in dizziness with daily performance of habituation performed re-assessment of peripheral canals.  With L sidelying test pt continues to present with onset of vertigo and nystagmus of short duration.  Performed L CRM and then deep head hanging manuever due to down beating nystagmus of short duration.  Following CRM progressed standing balance HEP and removed habituation.  Pt  continues to state that her white cane does not provide her with stability - discussed with pt that white cane is meant to allow pt to scan environment for safety and obstacles and not for balance stability.  Discussed use of white cane to scan environment and then using regular cane in the other hand for balance.  Will try next session.    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, IBS, HTN, HLD, femoral artery thrombosis, diverticulosis, cataract, OA, R shoulder surgery, lumbar laminectomy, R cannulated hip surgery, and embolectomy    Examination-Activity Limitations  Bed Mobility;Locomotion Level;Stand;Transfers;Stairs    Examination-Participation Restrictions  Shop;Community Activity    Rehab Potential  Good    PT Frequency  1x / week    PT Duration  4 weeks    PT Treatment/Interventions  ADLs/Self Care Home Management;Canalith Repostioning;Gait training;Stair training;Functional mobility training;Therapeutic activities;Therapeutic exercise;Balance training;Neuromuscular re-education;Patient/family education;Vestibular    PT Next Visit Plan  PT IS COMPLETELY VISUALLY IMPAIRED - USES WHITE CANE.   Recheck L BPPV.  Trial using white cane and regular cane for stability.  Add visits in January (6 more?)    Consulted and Agree with Plan of Care  Patient;Family member/caregiver    Family Member Consulted  Husband  Patient will benefit from skilled therapeutic intervention in order to improve the following deficits and impairments:  Decreased balance, Difficulty walking, Dizziness  Visit Diagnosis: Unsteadiness on feet  Dizziness and giddiness  Difficulty in walking, not elsewhere classified  BPPV (benign paroxysmal positional vertigo), left  Repeated falls     Problem List Patient Active Problem  List   Diagnosis Date Noted  . Vertigo of central origin 02/09/2019  . Other peripheral vertigo, unspecified ear 02/09/2019  . Nonintractable headache 02/09/2019  . Community acquired pneumonia 02/05/2019  . Hypothermia 02/05/2019  . CAP (community acquired pneumonia) 02/05/2019  . Pruritus 01/02/2019  . Rib pain on left side 08/09/2018  . Hypokalemia 08/09/2018  . Long term (current) use of anticoagulants 08/01/2018  . Pre-operative clearance 08/01/2018  . Closed compression fracture of L4 vertebra ([place]) 07/11/2018  . Bilateral leg edema 07/11/2018  . Lung nodule < 6cm on CT 07/11/2018  . Subclinical hypothyroidism 04/11/2018  . Fracture of femoral neck, right ([facility]) 03/22/2018  . PAF (paroxysmal atrial fibrillation) ([place]) 03/18/2018  . Femoral artery thrombosis, right ([facility]) 09/01/2017  . Herpes zoster without complication [id number]  . Jaw claudication 07/27/2017  . Temporal arteritis ([place]) 07/18/2017  . Vision, loss, sudden, bilateral 07/18/2017  . 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 Osteoarthritis 06/25/2016  . Anxiety 06/25/2016  . Back pain 07/18/2015  . 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 Carotid bruit present 06/05/2010  . Osteoporosis 06/03/2008  . History of colonic polyps 06/03/2008  . INTENTION TREMOR 12/06/2007  . IBS 10/16/2007  . Hyperlipidemia 06/06/2007  . Essential hypertension 06/06/2007  . Chronic obstructive airway disease with asthma ([place]) 02/23/2007    Rico Junker, PT, DPT 05/25/19    10:01 AM    [facility] [address] [place], [place], [address] Phone: [phone]   Fax:  [phone]  Name: Crystal [name] MRN: [id number] Date of Birth: [birth year]

## 2019-05-25 NOTE — Patient Instructions (Addendum)
Walking: Phase 1    Stand next to your counter, one hand on the counter. Walk forwards slowly taking a full step with each foot, try to keep your balance in the middle. Repeat 4 times.   Weight Shift: Anterior / Posterior (Limits of Stability)    Slowly shift weight backward until the weight is in your heels, Return to starting position. Shift weight slowly forward until you feel the weight in your toes. Repeat _10-12___ times per session. Do __2__ sessions per day.    Weight Shift: Lateral (Limits of Stability)    Slowly shift weight to right as far as possible leading with your hips, you should feel some weight come off your left foot. Return to starting position. Shift to opposite side.  Repeat __10-12__ times per session. Do __2__ sessions per day.     Staggered Stance    Stand with one foot forwards, one foot back (staggered stance).  One hand touching counter if needed.  Focus on shifting your weight from the front foot to the back foot in a slow, controlled manner.  Perform 8 repetitions with the right foot forwards and then 8 with the left foot forwards.   Side-Stepping    Walk to left side with eyes open. Take even steps, leading with same foot. Make sure each foot lifts off the floor. Repeat in opposite direction. Perform 4 laps to the left and to the right.   WALL BUMPS: Start with back and hips against a wall and chair in front of you for safety. Return to standing by moving hips forward and then back off wall. Repeat 10 times.

## 2019-06-01 ENCOUNTER — Ambulatory Visit: Payer: Medicare Other | Admitting: Physical Therapy

## 2019-06-01 ENCOUNTER — Encounter: Payer: Self-pay | Admitting: Physical Therapy

## 2019-06-01 ENCOUNTER — Other Ambulatory Visit: Payer: Self-pay

## 2019-06-01 DIAGNOSIS — R2681 Unsteadiness on feet: Secondary | ICD-10-CM

## 2019-06-01 DIAGNOSIS — H8112 Benign paroxysmal vertigo, left ear: Secondary | ICD-10-CM | POA: Diagnosis not present

## 2019-06-01 DIAGNOSIS — R262 Difficulty in walking, not elsewhere classified: Secondary | ICD-10-CM | POA: Diagnosis not present

## 2019-06-01 DIAGNOSIS — R42 Dizziness and giddiness: Secondary | ICD-10-CM

## 2019-06-01 DIAGNOSIS — H8111 Benign paroxysmal vertigo, right ear: Secondary | ICD-10-CM | POA: Diagnosis not present

## 2019-06-01 DIAGNOSIS — R296 Repeated falls: Secondary | ICD-10-CM | POA: Diagnosis not present

## 2019-06-01 NOTE — Therapy (Signed)
[facility] [address] [place], [place], [address] Phone: [phone]   Fax:  [phone]  Physical Therapy Treatment  Patient Details  Name: Crystal [name] MRN: [id number] Date of Birth: August 22, 1945 Referring Provider (PT): [name], MD   Encounter Date: 06/01/2019  PT End of Session - 06/01/19 0850    Visit Number  12    Number of Visits  13    Date for PT Re-Evaluation  06/12/19    Authorization Type  BCBS Medicare; $40 copay.  10th visit PN.  No VL    PT Start Time  0854    PT Stop Time  0935    PT Time Calculation (min)  41 min    Equipment Utilized During Treatment  Other (comment)   min guard to S prn   Activity Tolerance  Patient tolerated treatment well    Behavior During Therapy  [facility] for tasks assessed/performed       Past Medical History:  Diagnosis Date  . Adenomatous colon polyp 2007 & 2010    Dr [name]  . Allergy    seasonal  . Arthritis   . Cataract   . Diverticulosis   . 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 Hyperlipidemia   . Hypertension   . IBS (irritable bowel syndrome)   . Legally blind   . MVP (mitral valve prolapse)   . Paroxysmal atrial fibrillation with rapid ventricular response ([place]) 08/29/2017  . Peripheral vascular disease ([place])   . RAD (reactive airway disease)     Past Surgical History:  Procedure Laterality Date  . ABDOMINAL HYSTERECTOMY     with USO for dysfunctionall menses  . APPENDECTOMY    . ARTERY BIOPSY Bilateral 07/21/2017   Procedure: BIOPSY BILATERAL TEMPORAL ARTERY;  Surgeon: [name], MD;  Location: [place];  Service: Vascular;  Laterality: Bilateral;  . CARDIOVERSION N/A 06/16/2018   Procedure: CARDIOVERSION;  Surgeon: [name], MD;  Location: [place];  Service: Cardiovascular;  Laterality: N/A;  . CATARACT EXTRACTION W/ INTRAOCULAR LENS IMPLANT  2013   bilateral; Dr [name]  . COLONOSCOPY  2013   negative , due  2018; Dr [name]  . COLONOSCOPY W/ POLYPECTOMY      X2 ; diverticulosis  . EMBOLECTOMY Right 08/29/2017   Procedure: Right Groin Exploration  ;  Surgeon: Rosetta Posner, MD;  Location: [place];  Service: Vascular;  Laterality: Right;  . HIP PINNING,CANNULATED Right 03/21/2018  . HIP PINNING,CANNULATED Right 03/21/2018   Procedure: CANNULATED HIP PINNING;  Surgeon: Rod Can, MD;  Location: [place];  Service: Orthopedics;  Laterality: Right;  . LUMBAR LAMINECTOMY  2000   Dr Vertell Limber  . SHOULDER SURGERY     R shoulder  . TONSILLECTOMY      There were no vitals filed for this visit.  Subjective Assessment - 06/01/19 0850    Subjective  Felt pretty good after last treatment until yesterday. Had a bad dizzy spell yesterday and it feels like it's lingering -- was laying down and brought head up to re-adjust and felt spinning.    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  No/denies             Vestibular Assessment - 06/01/19 0858      Positional Testing   Dix-Hallpike  Dix-Hallpike Left;Dix-Hallpike Right    Horizontal Canal Testing  Horizontal Canal Right;Horizontal Canal Left      Horizontal Canal Right   Horizontal Canal Right Duration  Nystagmus <10 seconds with minimal symptoms reported    Horizontal Canal Right Symptoms  Geotrophic      Horizontal Canal Left   Horizontal Canal Left Duration  Nystagmus <10 seconds with reports of moderate dizziness    Horizontal Canal Left Symptoms  Geotrophic               [facility] Adult PT Treatment/Exercise - 06/01/19 0915      Ambulation/Gait    Ambulation/Gait  Yes    Ambulation/Gait Assistance  4: Min guard    Ambulation/Gait Assistance Details  CGA for safety, assist at pelvis to direct pt d/t visual impairment, cues to continue utilizing white cane to scan environement when ambulating,  cues for increase step length and gait speed for more efficient gait pattern    Ambulation Distance (Feet)  100 Feet    Assistive device  Other (Comment);Straight cane   white cane   Gait Pattern  Step-through pattern;Decreased stride length;Decreased dorsiflexion - right;Decreased dorsiflexion - left    Ambulation Surface  Level;Indoor      Vestibular Treatment/Exercise - 06/01/19 0904      Vestibular Treatment/Exercise   Vestibular Treatment Provided  Canalith Repositioning    Canalith Repositioning  Canal Roll Right      Canal Roll Right   Number of Reps   1    Overall Response   Improved Symptoms    Response Details   contiues with horizontal/geotropic nystagmus on the R, no nystagmus noted on L                  PT Long Term Goals - 05/09/19 1117      PT LONG TERM GOAL #1   Title  Pt will demonstrate ability to perform final HEP with supervision.    Time  4    Period  Weeks    Status  Revised    Target Date  06/08/19      PT LONG TERM GOAL #2   Title  Pt will demonstrate negative positional testing for multiple canals    Time  4    Period  Weeks    Status  Revised    Target Date  06/08/19      PT LONG TERM GOAL #3   Title  Pt will report no spinning when performing rolling in bed and during supine <> sit    Time  4    Period  Weeks    Status  Revised    Target Date  06/08/19      PT LONG TERM GOAL #4   Title  Pt will demonstrate ability to ambulate x 115' over indoor surfaces with white cane and negotiate 8 stairs with rails and supervision    Time  4    Period  Weeks    Status  Revised    Target Date  06/08/19      PT LONG TERM GOAL #5   Title  Pt will decrease falls risk as indicated by increase in  BERG by 4 points.    Baseline  25/56    Time  4    Period  Weeks    Status  New    Target Date  06/08/19            Plan - 06/01/19 0851    Clinical Impression Statement  Patient continues to demonstrate geotropic nystagmus with horizontal canal testing. PT performed CRM with mild reduction in symptoms. Initiated gait training with SPC and white cane. Patient required minA for safety and to direct around track d/t visual impairment. Cues for continued use of white cane to scan environment, longer step length and speed as patient had more practice. She demonstrated apprehension but became more comfortable with practice and with PT guard. Pt will continue ot benefit from skilled therapy services to address deficits and progress towards LTGs.    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, IBS, HTN, HLD, femoral artery thrombosis, diverticulosis, cataract, OA, R shoulder surgery, lumbar laminectomy, R cannulated hip surgery, and embolectomy    Examination-Activity Limitations  Bed Mobility;Locomotion Level;Stand;Transfers;Stairs    Examination-Participation Restrictions  Shop;Community Activity    Rehab Potential  Good    PT Frequency  1x / week    PT Duration  4 weeks    PT Treatment/Interventions  ADLs/Self Care Home Management;Canalith Repostioning;Gait training;Stair training;Functional mobility training;Therapeutic activities;Therapeutic exercise;Balance training;Neuromuscular re-education;Patient/family education;Vestibular    PT Next Visit Plan  PT IS COMPLETELY VISUALLY IMPAIRED - USES WHITE CANE.   Check LTGs. Recheck L BPPV.  Trial using white cane and regular cane for stability. Balance training as needed.    Consulted and Agree with Plan of Care  Patient;Family member/caregiver    Family Member  Consulted  Husband       Patient will benefit from skilled therapeutic intervention in order to improve the following deficits and impairments:  Decreased balance, Difficulty walking, Dizziness  Visit Diagnosis: Unsteadiness on feet  Dizziness and giddiness  Difficulty in walking, not elsewhere classified  BPPV (benign paroxysmal positional vertigo), left     Problem List Patient Active Problem List   Diagnosis Date Noted  . Vertigo of central origin 02/09/2019  . Other peripheral vertigo, unspecified ear 02/09/2019  . Nonintractable headache 02/09/2019  . Community acquired pneumonia 02/05/2019  . Hypothermia 02/05/2019  . CAP (community acquired pneumonia) 02/05/2019  . Pruritus 01/02/2019  . Rib pain on left side 08/09/2018  . Hypokalemia 08/09/2018  . Long term (current) use of anticoagulants 08/01/2018  . Pre-operative clearance 08/01/2018  . Closed compression fracture of L4 vertebra ([place]) 07/11/2018  . Bilateral leg edema 07/11/2018  . Lung nodule < 6cm on CT 07/11/2018  . Subclinical hypothyroidism 04/11/2018  . Fracture of femoral neck, right ([place]) 03/22/2018  . PAF (paroxysmal atrial fibrillation) ([place]) 03/18/2018  . 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 Herpes zoster without complication [id number]  . Jaw claudication 07/27/2017  . Temporal arteritis ([place]) 07/18/2017  . Vision, loss, sudden, bilateral 07/18/2017  . 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 Osteoarthritis 06/25/2016  . Anxiety 06/25/2016  . Back pain 07/18/2015  . 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 Carotid bruit present 06/05/2010  . Osteoporosis 06/03/2008  . History of colonic polyps 06/03/2008  . INTENTION TREMOR 12/06/2007  . IBS 10/16/2007  . Hyperlipidemia 06/06/2007  . Essential hypertension 06/06/2007  . Chronic obstructive airway disease with asthma ([place]) 02/23/2007    Juliann Pulse SPT 06/01/2019, 9:32 AM  [facility] [address] [place], [place], [address] Phone: [phone]   Fax:  [phone]  Name: Crystal [name] MRN: [id number] Date of Birth: 03-08-1946

## 2019-06-03 ENCOUNTER — Other Ambulatory Visit: Payer: Self-pay | Admitting: Internal Medicine

## 2019-06-07 ENCOUNTER — Encounter: Payer: Self-pay | Admitting: Physical Therapy

## 2019-06-07 ENCOUNTER — Other Ambulatory Visit: Payer: Self-pay

## 2019-06-07 ENCOUNTER — Ambulatory Visit: Payer: Medicare Other | Admitting: Physical Therapy

## 2019-06-07 DIAGNOSIS — R262 Difficulty in walking, not elsewhere classified: Secondary | ICD-10-CM | POA: Diagnosis not present

## 2019-06-07 DIAGNOSIS — H8111 Benign paroxysmal vertigo, right ear: Secondary | ICD-10-CM | POA: Diagnosis not present

## 2019-06-07 DIAGNOSIS — R2681 Unsteadiness on feet: Secondary | ICD-10-CM | POA: Diagnosis not present

## 2019-06-07 DIAGNOSIS — H8112 Benign paroxysmal vertigo, left ear: Secondary | ICD-10-CM

## 2019-06-07 DIAGNOSIS — R296 Repeated falls: Secondary | ICD-10-CM | POA: Diagnosis not present

## 2019-06-07 DIAGNOSIS — R42 Dizziness and giddiness: Secondary | ICD-10-CM

## 2019-06-07 NOTE — Therapy (Signed)
[facility] [address] [place], [place], [address] Phone: [phone]   Fax:  [phone]  Physical Therapy Treatment/Recertification  Patient Details  Name: Crystal [name] MRN: [id number] Date of Birth: 04/10/1946 Referring Provider (PT): Stacy J Burns, MD   Encounter Date: 06/07/2019  PT End of Session - 06/07/19 1030    Visit Number  13    Number of Visits  13    Date for PT Re-Evaluation  06/12/19    Authorization Type  BCBS Medicare; $40 copay.  10th visit PN.  No VL    PT Start Time  1023    PT Stop Time  1105    PT Time Calculation (min)  42 min    Equipment Utilized During Treatment  Other (comment)   min guard to S prn   Activity Tolerance  Patient tolerated treatment well    Behavior During Therapy  WFL for tasks assessed/performed       Past Medical History:  Diagnosis Date  . Adenomatous colon polyp 2007 & 2010    Dr Stark  . Allergy    seasonal  . Arthritis   . Cataract   . Diverticulosis   . 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 Hyperlipidemia   . Hypertension   . IBS (irritable bowel syndrome)   . Legally blind   . MVP (mitral valve prolapse)   . Paroxysmal atrial fibrillation with rapid ventricular response ([facility]) 08/29/2017  . Peripheral vascular disease ([facility])   . RAD (reactive airway disease)     Past Surgical History:  Procedure Laterality Date  . ABDOMINAL HYSTERECTOMY     with USO for dysfunctionall menses  . APPENDECTOMY    . ARTERY BIOPSY Bilateral 07/21/2017   Procedure: BIOPSY BILATERAL TEMPORAL ARTERY;  Surgeon: Dickson, Christopher S, MD;  [facility];  Service: Vascular;  Laterality: Bilateral;  . CARDIOVERSION N/A 06/16/2018   Procedure: CARDIOVERSION;  Surgeon: Crenshaw, Brian S, MD;  [facility];  Service: Cardiovascular;  Laterality: N/A;  . CATARACT EXTRACTION W/ INTRAOCULAR LENS IMPLANT  2013   bilateral; Dr Beavis  . COLONOSCOPY  2013   negative , due 2018; Dr Stark  . COLONOSCOPY W/ POLYPECTOMY      X2 ; diverticulosis  . EMBOLECTOMY Right 08/29/2017   Procedure: Right Groin Exploration  ;  Surgeon: Early, Todd F, MD;  Location: [facility];  Service: Vascular;  Laterality: Right;  . HIP PINNING,CANNULATED Right 03/21/2018  . HIP PINNING,CANNULATED Right 03/21/2018   Procedure: CANNULATED HIP PINNING;  Surgeon: Swinteck, Brian, MD;  Location: [facility];  Service: Orthopedics;  Laterality: Right;  . LUMBAR LAMINECTOMY  2000   Dr Stern  . SHOULDER SURGERY     R shoulder  . TONSILLECTOMY      There were no vitals filed for this visit.  Subjective Assessment - 06/07/19 1028    Subjective  Presented with increased swelling in bilateral LE but pt typically wears compression socks and did not come in with them today. Hadn't taken lasix this morning.  Had one episode of dizziness in bed the other night that resolved quickly, but overall dizziness is better.    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  No/denies         [facility] PT Assessment - 06/07/19 1032      Assessment   Medical Diagnosis  Vertigo    Referring Provider (PT)  Stacy J Burns, MD    Onset Date/Surgical Date  02/05/19      Observation/Other Assessments   Observations  Assessed bilateral LE swelling: redness (pt's husband reported was normal coloration for pt), pitting edema noted, no warmth, no pain      Ambulation/Gait   Ambulation/Gait  Yes    Ambulation/Gait Assistance  4: Min guard    Ambulation Distance (Feet)  230 Feet   115' x1 with L turns, 115' x1 with R turns    Assistive device  Other (Comment);Straight cane   white cane   Ambulation Surface  Level;Indoor    Gait Comments  Entered clinic with improved stride length without cues      Berg Balance Test   Sit to Stand  Able to stand  independently using hands    Standing Unsupported  Able to stand safely 2 minutes    Sitting with Back Unsupported but Feet Supported on Floor or Stool  Able to sit safely and securely 2 minutes    Stand to Sit  Controls descent by using hands    Transfers  Needs one person to assist    Standing Unsupported with Eyes Closed  Able to stand 10 seconds with supervision    Standing Unsupported with Feet Together  Able to place feet together independently and stand for 1 minute with supervision    From Standing, Reach Forward with Outstretched Arm  Reaches forward but needs supervision    From Standing Position, Pick up Object from Floor  Able to pick up shoe, needs supervision    From Standing Position, Turn to Look Behind Over each Shoulder  Needs supervision when turning    Turn 360 Degrees  Needs assistance while turning    Standing Unsupported, Alternately Place Feet on Step/Stool  Able to complete >2 steps/needs minimal assist    Standing Unsupported, One Foot in Front  Able to take small step independently and hold 30 seconds    Standing on One Leg  Tries to lift leg/unable to hold 3 seconds but remains standing independently    Total Score  30    Berg comment:  improved since 05/09/19: 25/56                   [facility] Adult PT Treatment/Exercise - 06/07/19 1032      Ambulation/Gait   Ambulation/Gait Assistance Details  CGA for safety, assist at pelvis to direct pt d/t visual impairment. PT discussed working on using cane and white cane for visual scanning at home down the hallway repetitively to better assess staying midline and not veering in any particular direction to not require assist at pelvis for guidance.    Gait Pattern  Step-to pattern;Decreased  stride length;Decreased dorsiflexion - right;Decreased dorsiflexion - left   L drift intermittently    Stairs  Yes    Stairs Assistance  3: Mod assist    Stair Management Technique  Alternating pattern;Step to pattern;Forwards;One rail Right   cane in L hand    Number of Stairs  4    Height of Stairs  6                   PT Short Term Goals - 06/07/19 1130      PT SHORT TERM GOAL #1   Title  Patient will report independent ambulation at home with white cane and SPC with supervision only    Time  4    Period  Weeks    Status  New    Target Date  07/07/19      PT SHORT TERM GOAL #2   Title  Patient will negotiate 8 stairs with railings & SPC with </= minA    Time  4    Period  Weeks    Status  New    Target Date  07/07/19           PT Long Term Goals - 06/07/19 1045      PT LONG TERM GOAL #1   Title  Pt will demonstrate ability to perform final HEP with supervision.    Time  4    Period  Weeks    Status  On-going      PT LONG TERM GOAL #2   Title  Pt will demonstrate negative positional testing for multiple canals    Time  4    Period  Weeks    Status  On-going      PT LONG TERM GOAL #3   Title  Pt will report no spinning when performing rolling in bed and during supine <> sit    Time  4    Period  Weeks    Status  On-going      PT LONG TERM GOAL #4   Title  Pt will demonstrate ability to ambulate x 115' over indoor surfaces with white cane and negotiate 8 stairs with rails and supervision    Baseline  06/07/19: ambulated with [facility] & white cane with minA, negotiated 4 stairs with SPC and min-modA    Time  4    Period  Weeks    Status  Partially Met      PT LONG TERM GOAL #5   Title  Pt will decrease falls risk as indicated by increase in BERG by 4 points.    Baseline  06/07/19: MET    Time  4    Period  Weeks    Status  Achieved          PT Long Term Goals - 06/07/19 1126      PT LONG TERM GOAL #1   Title  Pt will demonstrate ability to  perform final HEP with supervision. (all due 08/06/19)    Time  8    Period  Weeks    Status  On-going    Target Date  08/06/19      PT LONG TERM GOAL #2   Title  Pt will demonstrate negative positional testing for multiple canals    Time  8    Period  Weeks    Status  On-going    Target Date  08/06/19      PT LONG TERM GOAL #3   Title  Pt will report no spinning when performing rolling in bed and during supine <> sit    Time  8    Period  Weeks    Status  On-going    Target Date  08/06/19      PT LONG TERM GOAL #4   Title  Pt will demonstrate ability to ambulate 200' outside over pavement and negotiate curb with white cane & SPC    Baseline  06/07/19: ambulated with [facility] &  white cane with minA, negotiated 4 stairs with SPC and min-modA    Time  8    Period  Weeks    Status  Revised    Target Date  08/06/19      PT LONG TERM GOAL #5   Title  Pt will decrease falls risk as indicated by increase in BERG by 4 points.    Baseline  06/07/19: 30/56    Time  8    Period  Weeks    Status  Revised    Target Date  08/06/19          Plan - 06/07/19 1031    Clinical Impression Statement  Today's session focused on LTG assessment. Pt has achieved 1 out of 5 LTGs by increasing her BERG score to 30/56. She is considered high fall risk and is most challenged with narrow BOS, single leg stance, and moving outside of LOS. She partially met her gait & stair goal requiring min-modA with stair negotiation and SPC as she is still learning. She demonstrated improved environmental scanning with white cane ambulating during session. Pt still requires practice with SPC to increase confidence during ambulation and stair negotiation. Will continue to assess LTGs. Pt can benefit from continued skilled therapy to address deficits.    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, IBS, HTN, HLD, femoral artery thrombosis, diverticulosis, cataract, OA, R shoulder surgery, lumbar laminectomy, R cannulated hip surgery, and embolectomy    Examination-Activity Limitations  Bed Mobility;Locomotion Level;Stand;Transfers;Stairs    Examination-Participation Restrictions  Shop;Community Activity    Rehab Potential  Good    PT Frequency  1x / week    PT Duration  8 weeks    PT Treatment/Interventions  ADLs/Self Care Home Management;Canalith Repostioning;Gait training;Stair training;Functional mobility training;Therapeutic activities;Therapeutic exercise;Balance training;Neuromuscular re-education;Patient/family education;Vestibular    PT Next Visit Plan  PT IS COMPLETELY VISUALLY IMPAIRED - USES WHITE CANE.   Recheck L BPPV as needed if pt reports return in dizziness.  Gait and stair training with white cane and regular cane for stability. Balance training.    Consulted and Agree with Plan of Care  Patient;Family member/caregiver    Family Member Consulted  Husband       Patient will benefit from skilled therapeutic intervention in order to improve the following deficits and impairments:  Decreased balance, Difficulty walking, Dizziness  Visit Diagnosis: Unsteadiness on feet  Dizziness and giddiness  Difficulty in walking, not elsewhere classified  BPPV (benign paroxysmal positional vertigo), left  Repeated falls  BPPV (benign paroxysmal positional vertigo), right     Problem List Patient Active Problem List   Diagnosis Date Noted  . Vertigo of central origin 02/09/2019  . Other peripheral vertigo, unspecified ear 02/09/2019  . Nonintractable headache 02/09/2019  . Community acquired pneumonia 02/05/2019  . Hypothermia 02/05/2019  . CAP (community acquired pneumonia) 02/05/2019  . Pruritus 01/02/2019  . Rib pain on left side 08/09/2018  . Hypokalemia 08/09/2018  . Long term (current) use of  anticoagulants 08/01/2018  . Pre-operative clearance 08/01/2018  . Closed compression fracture of L4 vertebra ([place]) 07/11/2018  . Bilateral leg edema 07/11/2018  . Lung nodule < 6cm on CT 07/11/2018  . Subclinical hypothyroidism 04/11/2018  . Fracture of femoral neck, right ([place]) 03/22/2018  . PAF (paroxysmal atrial fibrillation) ([place]) 03/18/2018  . Femoral artery thrombosis,  right ([place]) 09/01/2017  . Herpes zoster without complication 28/41/3244  . Jaw claudication 07/27/2017  . Temporal arteritis ([place]) 07/18/2017  . Vision, loss, sudden, bilateral 07/18/2017  . 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 Osteoarthritis 06/25/2016  . Anxiety 06/25/2016  . Back pain 07/18/2015  . 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 Carotid bruit present 06/05/2010  . Osteoporosis 06/03/2008  . History of colonic polyps 06/03/2008  . INTENTION TREMOR 12/06/2007  . IBS 10/16/2007  . Hyperlipidemia 06/06/2007  . Essential hypertension 06/06/2007  . Chronic obstructive airway disease with asthma ([place]) 02/23/2007    Crystal [name] SPT 06/07/2019, 11:09 AM  [facility] [address] [place], [place], [address] Phone: [phone]   Fax:  [phone]  Name: Crystal [name] MRN: [id number] Date of Birth: 11/26/1945

## 2019-06-21 ENCOUNTER — Ambulatory Visit: Payer: Medicare Other | Admitting: Physical Therapy

## 2019-06-28 ENCOUNTER — Ambulatory Visit: Payer: Medicare Other | Attending: Internal Medicine | Admitting: Physical Therapy

## 2019-06-28 ENCOUNTER — Other Ambulatory Visit: Payer: Self-pay

## 2019-06-28 DIAGNOSIS — R42 Dizziness and giddiness: Secondary | ICD-10-CM | POA: Diagnosis not present

## 2019-06-28 DIAGNOSIS — R296 Repeated falls: Secondary | ICD-10-CM | POA: Diagnosis not present

## 2019-06-28 DIAGNOSIS — H8111 Benign paroxysmal vertigo, right ear: Secondary | ICD-10-CM

## 2019-06-28 DIAGNOSIS — R262 Difficulty in walking, not elsewhere classified: Secondary | ICD-10-CM

## 2019-06-28 DIAGNOSIS — R2681 Unsteadiness on feet: Secondary | ICD-10-CM | POA: Diagnosis not present

## 2019-06-28 NOTE — Therapy (Signed)
[facility] [address] [place], [place], [address] Phone: [phone]   Fax:  [phone]  Physical Therapy Treatment  Patient Details  Name: Crystal [name] MRN: [id number] Date of Birth: Jul 10, 1945 Referring Provider (PT): [name], MD   Encounter Date: 06/28/2019  PT End of Session - 06/28/19 1112    Visit Number  14    Number of Visits  21    Date for PT Re-Evaluation  08/06/19    Authorization Type  BC[employer] Medicare; $40 copay.  10th visit PN.  No VL    PT Start Time  0853    PT Stop Time  0934    PT Time Calculation (min)  41 min    Activity Tolerance  Other (comment)   dizziness   Behavior During Therapy  Anxious       Past Medical History:  Diagnosis Date  . Adenomatous colon polyp 2007 & 2010    Dr [name]  . Allergy    seasonal  . Arthritis   . Cataract   . Diverticulosis   . 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 Hyperlipidemia   . Hypertension   . IBS (irritable bowel syndrome)   . Legally blind   . MVP (mitral valve prolapse)   . Paroxysmal atrial fibrillation with rapid ventricular response ([place]) 08/29/2017  . Peripheral vascular disease ([facility])   . RAD (reactive airway disease)     Past Surgical History:  Procedure Laterality Date  . ABDOMINAL HYSTERECTOMY     with USO for dysfunctionall menses  . APPENDECTOMY    . ARTERY BIOPSY Bilateral 07/21/2017   Procedure: BIOPSY BILATERAL TEMPORAL ARTERY;  Surgeon: [name], MD;  Location: [facility];  Service: Vascular;  Laterality: Bilateral;  . CARDIOVERSION N/A 06/16/2018   Procedure: CARDIOVERSION;  Surgeon: [name], MD;  Location: [facility];  Service: Cardiovascular;  Laterality: N/A;  . CATARACT EXTRACTION W/ INTRAOCULAR LENS IMPLANT  2013   bilateral; Dr [name]  . COLONOSCOPY  2013   negative , due 2018; Dr [name]  . COLONOSCOPY W/ POLYPECTOMY      X2 ; diverticulosis  . EMBOLECTOMY Right 08/29/2017   Procedure: Right Groin Exploration  ;  Surgeon: Rosetta Posner, MD;  Location: [facility];  Service: Vascular;  Laterality: Right;  . HIP PINNING,CANNULATED Right 03/21/2018  . HIP PINNING,CANNULATED Right 03/21/2018   Procedure: CANNULATED HIP PINNING;  Surgeon: Rod Can, MD;  Location: [facility];  Service: Orthopedics;  Laterality: Right;  . LUMBAR LAMINECTOMY  2000   Dr Vertell Limber  . SHOULDER SURGERY     R shoulder  . TONSILLECTOMY      There were no vitals filed for this visit.  Subjective Assessment - 06/28/19 0858    Subjective  No spinning dizziness.  Pt had to cancel last week due to IBS; has been having multiple episodes of LOB posterior when at nail salon, hair salon and at home.  Daughter in law has been coming over to help with exercises.  Has been walking down the hall with her cane and white cane and doing well; had to stop when the imbalance came back.    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  Yes   back and hands            Vestibular Assessment - 06/28/19 0900      Positional Testing   Dix-Hallpike  Dix-Hallpike Left;Dix-Hallpike Right    Horizontal Canal Testing  Horizontal Canal Right;Horizontal Canal Left      Horizontal Canal Right   Horizontal Canal Right Duration  10 seconds; more symptomatic to R side    Horizontal Canal Right Symptoms  Geotrophic      Horizontal Canal Left   Horizontal Canal Left Duration  5 seconds    Horizontal Canal Left Symptoms  Geotrophic               [facility] Adult PT Treatment/Exercise - 06/28/19 1109      High Level Balance   High  Level Balance Activities  Other (comment)    High Level Balance Comments  Weight shifting anterior/posterior with bilat UE > one and no UE support and then adding in raising UE overhead during weight shifting forwards x 10 reps with min A       Therapeutic Activites    Therapeutic Activities  Other Therapeutic Activities    Other Therapeutic Activities  removed rolling and brandt-daroff from HEP for now.  Changed wall bumps to repeated sit > stand on HEP.      Exercises   Exercises  Knee/Hip      Knee/Hip Exercises: Seated   Sit to Sand  1 set;10 reps;with UE support   on white cane, "bowing" forwards to weight shift     Vestibular Treatment/Exercise - 06/28/19 0912      Vestibular Treatment/Exercise   Vestibular Treatment Provided  Canalith Repositioning    Canalith Repositioning  Canal Roll Right      Canal Roll Right   Number of Reps   1    Overall Response   Symptoms Resolved            PT Education - 06/28/19 1112    Education Details  adjusted HEP    Person(s) Educated  Patient;Spouse    Methods  Explanation;Demonstration;Handout    Comprehension  Verbalized understanding;Returned demonstration       Walking: Phase 1    Stand next to your counter, one hand on the counter. Walk forwards and backwards slowly taking a full step with each foot, try to keep your balance in the middle. Repeat 4 times.   Weight Shift: Anterior / Posterior (Limits of Stability)    Slowly shift weight backward until the weight is in your heels, Return to starting position. Shift weight slowly forward until you feel the weight in your toes. Repeat _10-12___ times per session. Do __2__ sessions per day.    Weight Shift: Lateral (Limits of Stability)    Slowly shift weight to right as far as possible leading with your hips, you should feel some weight come off your left foot. Return to starting position. Shift to opposite side.  Repeat __10-12__ times per session. Do __2__  sessions per day.     Staggered Stance    Stand with one foot forwards, one foot back (staggered stance).  One hand touching counter if needed.  Focus on shifting your weight from the front foot to the back foot in a slow, controlled manner.  Perform 8 repetitions with the right foot forwards and then 8 with the left foot forwards.  Side-Stepping    Walk to left side with eyes open. Take even steps, leading with same foot. Make sure each foot lifts off the floor. Repeat in opposite direction. Perform 4 laps to the left and to the right.   Knee Extension: Sit to Stand (Eccentric)    Sit in a sturdy chair with white cane in both hands.  Bow forwards from the hips and stand up tall.  Bow forwards reaching hips back to seat and sit slowly.  Repeat 10-12 times.     PT Short Term Goals - 06/07/19 1130      PT SHORT TERM GOAL #1   Title  Patient will report independent ambulation at home with white cane and SPC with supervision only    Time  4    Period  Weeks    Status  New    Target Date  07/07/19      PT SHORT TERM GOAL #2   Title  Patient will negotiate 8 stairs with railings & SPC with </= minA    Time  4    Period  Weeks    Status  New    Target Date  07/07/19        PT Long Term Goals - 06/07/19 1126      PT LONG TERM GOAL #1   Title  Pt will demonstrate ability to perform final HEP with supervision. (all due 08/06/19)    Time  8    Period  Weeks    Status  On-going    Target Date  08/06/19      PT LONG TERM GOAL #2   Title  Pt will demonstrate negative positional testing for multiple canals    Time  8    Period  Weeks    Status  On-going    Target Date  08/06/19      PT LONG TERM GOAL #3   Title  Pt will report no spinning when performing rolling in bed and during supine <> sit    Time  8    Period  Weeks    Status  On-going    Target Date  08/06/19      PT LONG TERM GOAL #4   Title  Pt will demonstrate ability to ambulate 200' outside over  pavement and negotiate curb with white cane & SPC    Baseline  06/07/19: ambulated with [facility] & white cane with minA, negotiated 4 stairs with SPC and min-modA    Time  8    Period  Weeks    Status  Revised    Target Date  08/06/19      PT LONG TERM GOAL #5   Title  Pt will decrease falls risk as indicated by increase in BERG by 4 points.    Baseline  06/07/19: 30/56    Time  8    Period  Weeks    Status  Revised    Target Date  08/06/19            Plan - 06/28/19 1113    Clinical Impression Statement  Pt presents with return of R horizontal canal canalithiasis, treated with 1 R canal roll with mitigation of symptoms.  Rest of session focused on adjustment of HEP to continue to address LE weakness and hip strategy training.  Initiated weight shifting with arm raises but did not add to HEP today; will continue to address in order to progress towards LTG.    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, IBS, HTN, HLD, femoral artery thrombosis, diverticulosis, cataract, OA, R shoulder surgery, lumbar laminectomy, R cannulated hip surgery, and embolectomy    Examination-Activity Limitations  Bed Mobility;Locomotion Level;Stand;Transfers;Stairs    Examination-Participation Restrictions  Shop;Community Activity    Rehab Potential  Good    PT Frequency  1x / week    PT Duration  8 weeks    PT Treatment/Interventions  ADLs/Self Care Home Management;Canalith Repostioning;Gait training;Stair training;Functional mobility training;Therapeutic activities;Therapeutic exercise;Balance training;Neuromuscular re-education;Patient/family education;Vestibular    PT Next Visit Plan  PT IS COMPLETELY VISUALLY IMPAIRED - USES WHITE CANE. Balance training-weight shifting, rockerboard, ankle and hip strategy training with  decreased UE support (loses balance when she lifts her hands up to fix her hair).  Gait and stair training with white cane and regular cane for stability.    Consulted and Agree with Plan of Care  Patient;Family member/caregiver    Family Member Consulted  Husband       Patient will benefit from skilled therapeutic intervention in order to improve the following deficits and impairments:  Decreased balance, Difficulty walking, Dizziness  Visit Diagnosis: BPPV (benign paroxysmal positional vertigo), right  Repeated falls  Difficulty in walking, not elsewhere classified  Dizziness and giddiness  Unsteadiness on feet     Problem List Patient Active Problem List   Diagnosis Date Noted  . Vertigo of central origin 02/09/2019  . Other peripheral vertigo, unspecified ear 02/09/2019  . Nonintractable headache 02/09/2019  . Community acquired pneumonia 02/05/2019  . Hypothermia 02/05/2019  . CAP (community acquired pneumonia) 02/05/2019  . Pruritus 01/02/2019  . Rib pain on left side 08/09/2018  . Hypokalemia 08/09/2018  . Long term (current) use of anticoagulants 08/01/2018  . Pre-operative clearance 08/01/2018  . Closed compression fracture of L4 vertebra ([facility]) 07/11/2018  . Bilateral leg edema 07/11/2018  . Lung nodule < 6cm on CT 07/11/2018  . Subclinical hypothyroidism 04/11/2018  . Fracture of femoral neck, right ([facility]) 03/22/2018  . PAF (paroxysmal atrial fibrillation) ([facility]) 03/18/2018  . 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 Herpes zoster without complication [id number]  . Jaw claudication 07/27/2017  . Temporal arteritis ([facility]) 07/18/2017  . Vision, loss, sudden, bilateral 07/18/2017  . 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 Osteoarthritis 06/25/2016  . Anxiety 06/25/2016  . Back pain 07/18/2015  . 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 Carotid bruit present 06/05/2010  . Osteoporosis 06/03/2008  . History of colonic polyps 06/03/2008  . INTENTION TREMOR 12/06/2007  .  IBS 10/16/2007  . Hyperlipidemia 06/06/2007  . Essential hypertension 06/06/2007  . Chronic obstructive airway disease with asthma ([facility]) 02/23/2007    Rico Junker, PT, DPT 06/28/19    11:27 AM    [facility] [address] [place], [place], [address] Phone: [phone]   Fax:  [phone]  Name: Crystal [name] MRN: [id number] Date of Birth: 12/07/1945

## 2019-06-28 NOTE — Patient Instructions (Signed)
Walking: Phase 1    Stand next to your counter, one hand on the counter. Walk forwards and backwards slowly taking a full step with each foot, try to keep your balance in the middle. Repeat 4 times.   Weight Shift: Anterior / Posterior (Limits of Stability)    Slowly shift weight backward until the weight is in your heels, Return to starting position. Shift weight slowly forward until you feel the weight in your toes. Repeat _10-12___ times per session. Do __2__ sessions per day.    Weight Shift: Lateral (Limits of Stability)    Slowly shift weight to right as far as possible leading with your hips, you should feel some weight come off your left foot. Return to starting position. Shift to opposite side.  Repeat __10-12__ times per session. Do __2__ sessions per day.     Staggered Stance    Stand with one foot forwards, one foot back (staggered stance).  One hand touching counter if needed.  Focus on shifting your weight from the front foot to the back foot in a slow, controlled manner.  Perform 8 repetitions with the right foot forwards and then 8 with the left foot forwards.   Side-Stepping    Walk to left side with eyes open. Take even steps, leading with same foot. Make sure each foot lifts off the floor. Repeat in opposite direction. Perform 4 laps to the left and to the right.   Knee Extension: Sit to Stand (Eccentric)    Sit in a sturdy chair with white cane in both hands.  Bow forwards from the hips and stand up tall.  Bow forwards reaching hips back to seat and sit slowly.  Repeat 10-12 times.

## 2019-07-03 ENCOUNTER — Other Ambulatory Visit: Payer: Self-pay

## 2019-07-03 ENCOUNTER — Ambulatory Visit: Payer: Medicare Other | Admitting: Physical Therapy

## 2019-07-03 DIAGNOSIS — R2681 Unsteadiness on feet: Secondary | ICD-10-CM

## 2019-07-03 DIAGNOSIS — R42 Dizziness and giddiness: Secondary | ICD-10-CM | POA: Diagnosis not present

## 2019-07-03 DIAGNOSIS — R262 Difficulty in walking, not elsewhere classified: Secondary | ICD-10-CM

## 2019-07-03 DIAGNOSIS — R296 Repeated falls: Secondary | ICD-10-CM | POA: Diagnosis not present

## 2019-07-03 DIAGNOSIS — H8111 Benign paroxysmal vertigo, right ear: Secondary | ICD-10-CM | POA: Diagnosis not present

## 2019-07-04 ENCOUNTER — Encounter: Payer: Self-pay | Admitting: Physical Therapy

## 2019-07-04 NOTE — Therapy (Signed)
[facility] [address] [place], [place], [address] Phone: [phone]   Fax:  [phone]  Physical Therapy Treatment  Patient Details  Name: Crystal [name] MRN: [id number] Date of Birth: [birth year] Referring Provider (PT): [name], MD   Encounter Date: 07/03/2019  PT End of Session - 07/04/19 2015    Visit Number  15    Number of Visits  21    Date for PT Re-Evaluation  08/06/19    Authorization Type  BC[employer] Medicare; $40 copay.  10th visit PN.  No VL    PT Start Time  0847    PT Stop Time  0930    PT Time Calculation (min)  43 min    Equipment Utilized During Treatment  Gait belt    Activity Tolerance  Other (comment);Patient tolerated treatment well   dizziness   Behavior During Therapy  Anxious;WFL for tasks assessed/performed       Past Medical History:  Diagnosis Date  . Adenomatous colon polyp 2007 & 2010    Dr [name]  . Allergy    seasonal  . Arthritis   . Cataract   . Diverticulosis   . 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 Hyperlipidemia   . Hypertension   . IBS (irritable bowel syndrome)   . Legally blind   . MVP (mitral valve prolapse)   . Paroxysmal atrial fibrillation with rapid ventricular response ([facility]) 08/29/2017  . Peripheral vascular disease ([facility])   . RAD (reactive airway disease)     Past Surgical History:  Procedure Laterality Date  . ABDOMINAL HYSTERECTOMY     with USO for dysfunctionall menses  . APPENDECTOMY    . ARTERY BIOPSY Bilateral 07/21/2017   Procedure: BIOPSY BILATERAL TEMPORAL ARTERY;  Surgeon: [name], MD;  Location: [facility];  Service: Vascular;  Laterality: Bilateral;  . CARDIOVERSION N/A 06/16/2018   Procedure: CARDIOVERSION;  Surgeon: [name], MD;  Location: [facility];  Service: Cardiovascular;  Laterality: N/A;  . CATARACT EXTRACTION W/ INTRAOCULAR LENS IMPLANT  2013   bilateral; Dr [name]  . COLONOSCOPY  2013   negative ,  due 2018; Dr [name]  . COLONOSCOPY W/ POLYPECTOMY      X2 ; diverticulosis  . EMBOLECTOMY Right 08/29/2017   Procedure: Right Groin Exploration  ;  Surgeon: Rosetta Posner, MD;  Location: [facility];  Service: Vascular;  Laterality: Right;  . HIP PINNING,CANNULATED Right 03/21/2018  . HIP PINNING,CANNULATED Right 03/21/2018   Procedure: CANNULATED HIP PINNING;  Surgeon: Rod Can, MD;  Location: [facility];  Service: Orthopedics;  Laterality: Right;  . LUMBAR LAMINECTOMY  2000   Dr Vertell Limber  . SHOULDER SURGERY     R shoulder  . TONSILLECTOMY      There were no vitals filed for this visit.  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  No/denies                       [facility] Adult PT Treatment/Exercise - 07/04/19 0001      Transfers   Transfers  Sit to Stand;Stand to Sit    Sit to Stand  5: Supervision;4: Min assist   min to mod assist with feet on Airex   Number of Reps  10 reps   5 reps feet on floor; 5 reps feet on Airex   Comments  no UE support used with feet on floor; UE support needed with sit to stand with feet on Airex      Ambulation/Gait   Ambulation/Gait  Yes    Ambulation/Gait Assistance  4: Min assist    Ambulation/Gait Assistance Details  cues given due to blindness    Ambulation Distance  (Feet)  230 Feet    Assistive device  Straight cane;Other (Comment)   white cane & SPC   Gait Pattern  Step-through pattern    Ambulation Surface  Level;Indoor          Balance Exercises - 07/04/19 2006      Balance Exercises: Standing   Standing Eyes Opened  Wide (BOA);Solid surface;Other (comment)   moving ball up/down; then in 1/2 circle for rotation R & L    Heel Raises  Both;10 reps    Toe Raise  Both;10 reps    Other Standing Exercises  Pt performed standing on Airex with mod assist to maintain balance; marching on AIrex with max to mod assist for recovery of LOB     Other Standing Exercises Comments  Pt performed stepping strategy forward/back, laterally and back/up 10 reps each leg each direction with min to mod assist       Pt performed simulated hair care activity in standing with pt using both hands to perform simulated rolling hair with mod assist Initially, decreasing to min assist as pt became less fearful of falling/LOB and became more confident; changed pt's stance From bilateral stance to staggered stance with Lt foot in back, Rt foot in front; staggered stance improved stability significantly   PT Education - 07/04/19 2014    Education Details  recommended pt begin to complete hair care in standing with leaning against cabinet for stability    Person(s) Educated  Patient;Spouse    Methods  Explanation    Comprehension  Verbalized understanding       PT Short Term Goals - 07/04/19 2021      PT SHORT TERM GOAL #1   Title  Patient will report independent ambulation at home with white cane and SPC with supervision only    Time  4    Period  Weeks    Status  New    Target Date  07/07/19      PT SHORT TERM GOAL #2   Title  Patient will negotiate 8 stairs with railings & SPC with </= minA    Time  4    Period  Weeks    Status  New    Target Date  07/07/19        PT Long Term Goals - 07/04/19 2022      PT LONG TERM GOAL #1   Title  Pt will demonstrate  ability to perform final HEP with supervision. (all due 08/06/19)    Time  8  Period  Weeks    Status  On-going      PT LONG TERM GOAL #2   Title  Pt will demonstrate negative positional testing for multiple canals    Time  8    Period  Weeks    Status  On-going      PT LONG TERM GOAL #3   Title  Pt will report no spinning when performing rolling in bed and during supine <> sit    Time  8    Period  Weeks    Status  On-going      PT LONG TERM GOAL #4   Title  Pt will demonstrate ability to ambulate 200' outside over pavement and negotiate curb with white cane & SPC    Baseline  06/07/19: ambulated with [facility] & white cane with minA, negotiated 4 stairs with SPC and min-modA    Time  8    Period  Weeks    Status  Revised      PT LONG TERM GOAL #5   Title  Pt will decrease falls risk as indicated by increase in BERG by 4 points.    Baseline  06/07/19: 30/56    Time  8    Period  Weeks    Status  Revised            Plan - 07/04/19 2016    Clinical Impression Statement  Pt reported no dizziness at today's session; pt performed dynamic standing balance activities but has significant fear of falling which impacts steadiness and results in greater postural instability.  Pt improved with maintaining balance while performing simulated hair care activity in standing.    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, IBS, HTN, HLD, femoral artery thrombosis, diverticulosis, cataract, OA, R shoulder surgery, lumbar laminectomy, R cannulated hip surgery, and embolectomy    Examination-Activity Limitations  Bed Mobility;Locomotion Level;Stand;Transfers;Stairs    Examination-Participation Restrictions  Shop;Community Activity    Rehab Potential  Good    PT Frequency  1x / week    PT Duration  8 weeks    PT  Treatment/Interventions  ADLs/Self Care Home Management;Canalith Repostioning;Gait training;Stair training;Functional mobility training;Therapeutic activities;Therapeutic exercise;Balance training;Neuromuscular re-education;Patient/family education;Vestibular    PT Next Visit Plan  PT IS COMPLETELY VISUALLY IMPAIRED - USES WHITE CANE. Balance training-weight shifting, rockerboard, ankle and hip strategy training with decreased UE support (loses balance when she lifts her hands up to fix her hair).  Gait and stair training with white cane and regular cane for stability.    Consulted and Agree with Plan of Care  Patient;Family member/caregiver    Family Member Consulted  Husband       Patient will benefit from skilled therapeutic intervention in order to improve the following deficits and impairments:  Decreased balance, Difficulty walking, Dizziness  Visit Diagnosis: Unsteadiness on feet  Difficulty in walking, not elsewhere classified     Problem List Patient Active Problem List   Diagnosis Date Noted  . Vertigo of central origin 02/09/2019  . Other peripheral vertigo, unspecified ear 02/09/2019  . Nonintractable headache 02/09/2019  . Community acquired pneumonia 02/05/2019  . Hypothermia 02/05/2019  . CAP (community acquired pneumonia) 02/05/2019  . Pruritus 01/02/2019  . Rib pain on left side 08/09/2018  . Hypokalemia 08/09/2018  . Long term (current) use of anticoagulants 08/01/2018  .  Pre-operative clearance 08/01/2018  . Closed compression fracture of L4 vertebra ([facility]) 07/11/2018  . Bilateral leg edema 07/11/2018  . Lung nodule < 6cm on CT 07/11/2018  . Subclinical hypothyroidism 04/11/2018  . Fracture of femoral neck, right ([facility]) 03/22/2018  . PAF (paroxysmal atrial fibrillation) ([facility]) 03/18/2018  . 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 Herpes zoster without complication [id number]  . Jaw claudication 07/27/2017  . Temporal arteritis ([facility]) 07/18/2017  .  Vision, loss, sudden, bilateral 07/18/2017  . 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 Osteoarthritis 06/25/2016  . Anxiety 06/25/2016  . Back pain 07/18/2015  . 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 Carotid bruit present 06/05/2010  . Osteoporosis 06/03/2008  . History of colonic polyps 06/03/2008  . INTENTION TREMOR 12/06/2007  . IBS 10/16/2007  . Hyperlipidemia 06/06/2007  . Essential hypertension 06/06/2007  . Chronic obstructive airway disease with asthma ([place]) 02/23/2007    Deshanda Molitor, [place], PT 07/04/2019, 8:24 PM  [facility] [address] [place], [place], [address] Phone: [phone]   Fax:  [phone]  Name: Crystal [name] MRN: [id number] Date of Birth: [birth year]

## 2019-07-05 ENCOUNTER — Ambulatory Visit: Payer: Medicare Other | Admitting: Internal Medicine

## 2019-07-08 ENCOUNTER — Other Ambulatory Visit: Payer: Self-pay | Admitting: Internal Medicine

## 2019-07-09 ENCOUNTER — Encounter: Payer: Self-pay | Admitting: Adult Health

## 2019-07-09 ENCOUNTER — Ambulatory Visit: Payer: Medicare Other | Admitting: Physical Therapy

## 2019-07-09 ENCOUNTER — Ambulatory Visit: Payer: Medicare Other | Admitting: Adult Health

## 2019-07-09 ENCOUNTER — Telehealth: Payer: Self-pay | Admitting: Cardiology

## 2019-07-09 ENCOUNTER — Encounter: Payer: Self-pay | Admitting: *Deleted

## 2019-07-09 ENCOUNTER — Other Ambulatory Visit: Payer: Self-pay

## 2019-07-09 VITALS — BP 98/72 | HR 118

## 2019-07-09 VITALS — BP 110/60 | HR 118 | Ht 64.0 in | Wt 133.0 lb

## 2019-07-09 DIAGNOSIS — I480 Paroxysmal atrial fibrillation: Secondary | ICD-10-CM

## 2019-07-09 DIAGNOSIS — I10 Essential (primary) hypertension: Secondary | ICD-10-CM | POA: Diagnosis not present

## 2019-07-09 DIAGNOSIS — M316 Other giant cell arteritis: Secondary | ICD-10-CM

## 2019-07-09 MED ORDER — AMIODARONE HCL 200 MG PO TABS: ORAL_TABLET | ORAL | 3 refills | Status: DC

## 2019-07-09 NOTE — Telephone Encounter (Signed)
-----   Message from [name], MD sent at 07/09/2019 11:09 AM EST ----- Crystal [name], Increase Cardizem to 180 mg daily.  Schedule PA office visit.  Will hold on further PT until seen in the office. [name] ----- Message ----- From: Rico Junker, PT Sent: 07/09/2019  10:54 AM EST To: [name], MD  Hello Dr. Stanford Breed, I have been working with [name] for vertigo, balance and falls.  Today she presented with symptoms of active afib - dizziness, resting heart rate around 120 -irregular, irregular.  Therapy held for today and I contacted your office to set up an appointment for her.    Do you have any recommendations/restrictions for therapy at this time?  Would you like [place] to hold therapy until you have seen her or are we okay to continue with therapy if she is not symptomatic?  Thank you, Rico Junker, PT, DPT 07/09/19    10:53 AM

## 2019-07-09 NOTE — Telephone Encounter (Signed)
Returned call to Letta Moynahan she states  That pt is dizzy-like she is having a panic attack, weak and HR is 120 and irregular will call pt to discuss this. Called husband he will bring pt, she is blind, today at 3pm

## 2019-07-09 NOTE — Telephone Encounter (Signed)
Crystal [name] from [facility] calling stating the patient is exhibiting signs of active afib. She would like to discuss if anything needs to change in regards to her physical therapy or if she needs to hold therapy until after she is seen. She can be reached at [phone]

## 2019-07-09 NOTE — Telephone Encounter (Signed)
Left message for pt to call.

## 2019-07-09 NOTE — Telephone Encounter (Signed)
This encounter was created in error - please disregard.

## 2019-07-09 NOTE — Therapy (Signed)
[facility] [address] [place], [place], [address] Phone: [phone]   Fax:  [phone]  Physical Therapy Treatment  Patient Details  Name: Crystal [name] MRN: [id number] Date of Birth: [birth year] Referring Provider (PT): [name], MD   Encounter Date: 07/09/2019  PT End of Session - 07/09/19 1047    Visit Number  15   arrive no charge for 1/25   Number of Visits  21    Date for PT Re-Evaluation  08/06/19    Authorization Type  [employer] Medicare; $40 copay.  10th visit PN.  No VL    PT Start Time  1015    PT Stop Time  1040    PT Time Calculation (min)  25 min    Equipment Utilized During Treatment  Gait belt    Activity Tolerance  Other (comment);Patient tolerated treatment well   dizziness   Behavior During Therapy  Anxious;WFL for tasks assessed/performed       Past Medical History:  Diagnosis Date  . Adenomatous colon polyp 2007 & 2010    Dr [name]  . Allergy    seasonal  . Arthritis   . Cataract   . Diverticulosis   . Femoral artery thrombosis, right ([facility] [place]) 09/01/2017  . Hyperlipidemia   . Hypertension   . IBS (irritable bowel syndrome)   . Legally blind   . MVP (mitral valve prolapse)   . Paroxysmal atrial fibrillation with rapid ventricular response ([place]) 08/29/2017  . Peripheral vascular disease ([place])   . RAD (reactive airway disease)     Past Surgical History:  Procedure Laterality Date  . ABDOMINAL HYSTERECTOMY     with USO for dysfunctionall menses  . APPENDECTOMY    . ARTERY BIOPSY Bilateral 07/21/2017   Procedure: BIOPSY BILATERAL TEMPORAL ARTERY;  Surgeon: [name], MD;  Location: [facility];  Service: Vascular;  Laterality: Bilateral;  . CARDIOVERSION N/A 06/16/2018   Procedure: CARDIOVERSION;  Surgeon: [name], MD;  Location: [facility];  Service: Cardiovascular;  Laterality: N/A;  . CATARACT EXTRACTION W/ INTRAOCULAR LENS IMPLANT  2013   bilateral; Dr [name]  .  COLONOSCOPY  2013   negative , due 2018; Dr [name]  . COLONOSCOPY W/ POLYPECTOMY      X2 ; diverticulosis  . EMBOLECTOMY Right 08/29/2017   Procedure: Right Groin Exploration  ;  Surgeon: Rosetta Posner, MD;  Location: [facility];  Service: Vascular;  Laterality: Right;  . HIP PINNING,CANNULATED Right 03/21/2018  . HIP PINNING,CANNULATED Right 03/21/2018   Procedure: CANNULATED HIP PINNING;  Surgeon: Rod Can, MD;  Location: [facility];  Service: Orthopedics;  Laterality: Right;  . LUMBAR LAMINECTOMY  2000   Dr Vertell Limber  . SHOULDER SURGERY     R shoulder  . TONSILLECTOMY      Vitals:   07/09/19 1022  BP: 98/72  Pulse: (!) 118  SpO2: 93%    Subjective Assessment - 07/09/19 1045    Subjective  Having some dizziness but feels like she is in active afib - feels like a panic attack.  Back has been hurting more too.    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  Will contact pt once therapist hears back from physician.  Advised to continue with exercises when she does not feel symptomatic and when her daughter is there to assist to prevent falls.    Person(s) Educated  Patient;Spouse    Methods  Explanation    Comprehension  Verbalized understanding       PT Short Term Goals - 07/04/19 2021      PT SHORT TERM GOAL #1   Title  Patient will report  independent ambulation at home with white cane and SPC with supervision only    Time  4    Period  Weeks    Status  New    Target Date  07/07/19      PT SHORT TERM GOAL #2   Title  Patient will negotiate 8 stairs with railings & SPC with </= minA    Time  4    Period  Weeks    Status  New    Target Date  07/07/19        PT Long Term Goals - 07/04/19 2022      PT LONG TERM GOAL #1   Title  Pt will demonstrate ability to perform final HEP with supervision. (all due 08/06/19)    Time  8    Period  Weeks    Status  On-going      PT LONG TERM GOAL #2   Title  Pt will demonstrate negative positional testing for multiple canals    Time  8    Period  Weeks    Status  On-going      PT LONG TERM GOAL #3   Title  Pt will report no spinning when performing rolling in bed and during supine <> sit    Time  8    Period  Weeks    Status  On-going      PT LONG TERM GOAL #4   Title  Pt will demonstrate ability to ambulate 200' outside over pavement and negotiate curb with white cane & SPC    Baseline  06/07/19: ambulated with [facility] & white cane with minA, negotiated 4 stairs with SPC and min-modA    Time  8    Period  Weeks    Status  Revised      PT LONG TERM GOAL #5   Title  Pt will decrease falls risk as indicated by increase in BERG by 4 points.    Baseline  06/07/19: 30/56    Time  8    Period  Weeks    Status  Revised            Plan - 07/09/19 1047    Clinical Impression Statement  Due to pt reporting symptoms of active afib, high resting heart rate - irregular, irregular - therapy session held for today.  Contacted cardiologist office and requested treatment guidelines and schduled pt follow up with cardiologist.  Will continue to monitor symptoms.    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, IBS, HTN, HLD, femoral artery thrombosis, diverticulosis, cataract, OA, R shoulder surgery, lumbar laminectomy, R cannulated hip surgery, and embolectomy    Examination-Activity Limitations  Bed Mobility;Locomotion Level;Stand;Transfers;Stairs    Examination-Participation Restrictions  Shop;Community Activity    Rehab Potential  Good    PT Frequency  1x / week    PT Duration  8 weeks    PT Treatment/Interventions  ADLs/Self Care Home Management;Canalith Repostioning;Gait training;Stair training;Functional mobility training;Therapeutic activities;Therapeutic exercise;Balance training;Neuromuscular re-education;Patient/family education;Vestibular    PT Next Visit Plan  PT IS COMPLETELY VISUALLY IMPAIRED - USES WHITE CANE. CHECK STG.  Afib still?  back pain still?  Balance training-weight shifting, rockerboard, ankle and hip strategy training with decreased UE support (loses balance when she lifts her hands up to fix her hair).  Gait and stair training with white cane and regular cane for stability.    Consulted and Agree with Plan of Care  Patient;Family member/caregiver    Family Member Consulted  Husband       Patient will benefit from skilled therapeutic intervention in order to improve the following deficits and impairments:  Decreased balance, Difficulty walking, Dizziness  Visit Diagnosis: Dizziness and giddiness  Repeated falls     Problem List Patient Active Problem List   Diagnosis Date Noted  . Vertigo of central origin 02/09/2019  . Other peripheral vertigo, unspecified ear 02/09/2019  . Nonintractable headache 02/09/2019  . Community acquired pneumonia 02/05/2019  . Hypothermia 02/05/2019  . CAP (community acquired pneumonia) 02/05/2019  . Pruritus 01/02/2019  . Rib pain on left side 08/09/2018  . Hypokalemia 08/09/2018  . Long term (current) use of anticoagulants 08/01/2018  . Pre-operative clearance 08/01/2018  . Closed compression fracture of L4 vertebra ([facility])  07/11/2018  . Bilateral leg edema 07/11/2018  . Lung nodule < 6cm on CT 07/11/2018  . Subclinical hypothyroidism 04/11/2018  . Fracture of femoral neck, right ([facility]) 03/22/2018  . PAF (paroxysmal atrial fibrillation) ([facility]) 03/18/2018  . Femoral artery thrombosis, right ([facility]) 09/01/2017  . Herpes zoster without complication [id number]  . Jaw claudication 07/27/2017  . Temporal arteritis ([place]) 07/18/2017  . Vision, loss, sudden, bilateral 07/18/2017  . 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 Osteoarthritis 06/25/2016  . Anxiety 06/25/2016  . Back pain 07/18/2015  . 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 Carotid bruit present 06/05/2010  . Osteoporosis 06/03/2008  . History of colonic polyps 06/03/2008  . INTENTION TREMOR 12/06/2007  . IBS 10/16/2007  . Hyperlipidemia 06/06/2007  . Essential hypertension 06/06/2007  . Chronic obstructive airway disease with asthma ([place]) 02/23/2007    Rico Junker, PT, DPT 07/09/19    10:51 AM    [facility] [address] [place], [place], [address] Phone: [phone]   Fax:  [phone]  Name: Crystal [name] MRN: [id number] Date of Birth: [birth year]

## 2019-07-09 NOTE — Patient Instructions (Signed)
Medication Instructions:  Take 400mg (2 pills) Twice Daily for 1 week and then take 200mg (one pill) Daily.  If you need a refill on your cardiac medications before your next appointment, please call your pharmacy.   Lab work: NONE  Testing/Procedures: NONE  Follow-Up: At [facility], you and your health needs are our priority.  As part of our continuing mission to provide you with exceptional heart care, we have created designated Provider Care Teams.  These Care Teams include your primary Cardiologist (physician) and Advanced Practice Providers (APPs -  Physician Assistants and Nurse Practitioners) who all work together to provide you with the care you need, when you need it. You may see [name], MD or one of the following Advanced Practice Providers on your designated Care Team:    [name], PA-C  [place], [place]  Coletta Memos, [place]  Your physician wants you to follow-up Dr. Stanford Breed on February 9th 2021 @ 11:00am

## 2019-07-09 NOTE — Progress Notes (Signed)
Cardiology Office Note   Date:  07/09/2019   ID:  [name], DOB 1946-01-02, MRN [id number]  PCP:  [name], MD  Cardiologist: Stanford Breed  CC: Atrial fib    History of Present Illness: Crystal [name] is a 74 y.o. female who presents for PAF, hypertension hyperlipidemia, and chronic SFA occlusion.. Other history includes hypothyroidism and giant cell arteritis. Patient fell October 2019 and had a subcapital fracture of the right hip. She was in atrial fibrillation with rapid ventricular response at that time. She underwent repair of her hip fracture and was placed on amiodarone.Had cardioversion June 16, 2018. Carotid Dopplers March 2020 showed no stenosis on the right and 1 to 39% stenosis on the left.   She was last seen by Dr.Crenshaw  On 01/17/2019. At that time she was doing well.  She called our office today with complaints of rapid HR while undergoing PT. She was advised to increase diltiazem to 180 mg daily. She is legally blind and is accompanied by her husband. She was seen in the office as add on  appointment due to symptoms.  This is a focus visit concerning atrial fibrillation management.  She was undergoing physical rehab and had a "panicky feeling" and began to feel weak.  EKG today reveals atrial fib heart rate of 118 bpm.  She has not been taking amiodarone for several months.  Uncertain why this occurred.  On review of past notes this was not discontinued by cardiology or PCP.  She has been medically compliant on Eliquis.  She has not sustained any falls.  Her husband is very attentive.   Past Medical History:  Diagnosis Date  . Adenomatous colon polyp 2007 & 2010    Dr [name]  . Allergy    seasonal  . Arthritis   . Cataract   . Diverticulosis   . Femoral artery thrombosis, right ([facility]) 09/01/2017  . Hyperlipidemia   . Hypertension   . IBS (irritable bowel syndrome)   . Legally blind   . MVP (mitral valve prolapse)   . Paroxysmal atrial fibrillation with rapid  ventricular response ([facility] [facility]) 08/29/2017  . Peripheral vascular disease ([facility])   . RAD (reactive airway disease)     Past Surgical History:  Procedure Laterality Date  . ABDOMINAL HYSTERECTOMY     with USO for dysfunctionall menses  . APPENDECTOMY    . ARTERY BIOPSY Bilateral 07/21/2017   Procedure: BIOPSY BILATERAL TEMPORAL ARTERY;  Surgeon: [name], MD;  Location: [facility];  Service: Vascular;  Laterality: Bilateral;  . CARDIOVERSION N/A 06/16/2018   Procedure: CARDIOVERSION;  Surgeon: [name], MD;  Location: [facility];  Service: Cardiovascular;  Laterality: N/A;  . CATARACT EXTRACTION W/ INTRAOCULAR LENS IMPLANT  2013   bilateral; Dr [name]  . COLONOSCOPY  2013   negative , due 2018; Dr [name]  . COLONOSCOPY W/ POLYPECTOMY      X2 ; diverticulosis  . EMBOLECTOMY Right 08/29/2017   Procedure: Right Groin Exploration  ;  Surgeon: Rosetta Posner, MD;  Location: [facility];  Service: Vascular;  Laterality: Right;  . HIP PINNING,CANNULATED Right 03/21/2018  . HIP PINNING,CANNULATED Right 03/21/2018   Procedure: CANNULATED HIP PINNING;  Surgeon: Rod Can, MD;  Location: [facility];  Service: Orthopedics;  Laterality: Right;  . LUMBAR LAMINECTOMY  2000   Dr Vertell Limber  . SHOULDER SURGERY     R shoulder  . TONSILLECTOMY       Current Outpatient Medications  Medication Sig Dispense  Refill  . albuterol (VENTOLIN HFA) 108 (90 Base) MCG/ACT inhaler INHALE 1 PUFF INTO THE LUNGS AS DIRECTED IF NEEDED 8.5 g 0  . alendronate (FOSAMAX) 70 MG tablet Take 70 mg by mouth every Monday.     Marland Kitchen amiodarone (PACERONE) 200 MG tablet Take 1 tablet (200 mg total) by mouth daily. 90 tablet 3  . Calcium Citrate-Vitamin D (CALCIUM + D PO) Take 1 tablet by mouth daily.    . Cholecalciferol (VITAMIN D3) 1000 units CAPS Take 1 capsule (1,000 Units total) by mouth daily with lunch. 60 capsule 0  . diltiazem (CARDIZEM CD) 120 MG 24 hr capsule Take 1 capsule (120 mg total) by mouth daily. 90 capsule 1  .  ELIQUIS 5 MG TABS tablet TAKE 1 TABLET BY MOUTH TWO TIMES A DAY 180 tablet 1  . furosemide (LASIX) 40 MG tablet Take 1 tablet (40 mg total) by mouth daily. 90 tablet 1  . gabapentin (NEURONTIN) 100 MG capsule Take 5 capsules (500 mg total) by mouth at bedtime. (Patient taking differently: Take 900 mg by mouth at bedtime. ) 30 capsule 0  . KLOR-CON M20 20 MEQ tablet TAKE TWO TABLETS BY MOUTH DAILY 180 tablet 0  . levothyroxine (SYNTHROID) 50 MCG tablet Take 1 tablet (50 mcg total) by mouth daily. 90 tablet 3  . LORazepam (ATIVAN) 1 MG tablet TAKE ONE TABLET BY MOUTH EVERY NIGHT AT BEDTIME 30 tablet 5  . methocarbamol (ROBAXIN) 500 MG tablet TAKE ONE TABLET BY MOUTH EVERY 6 HOURS AS NEEDED FOR MUSCLE SPASMS 120 tablet 1  . montelukast (SINGULAIR) 10 MG tablet Take 1 tablet (10 mg total) by mouth at bedtime. 90 tablet 1  . predniSONE (DELTASONE) 5 MG tablet Take 5 mg by mouth daily with breakfast.     . SYMBICORT 160-4.5 MCG/ACT inhaler INHALE ONE TO TWO PUFFS BY MOUTH EVERY 12 HOURS, THEN GARGLE AND SPIT AFTER USE 10.2 g 9  . theophylline (UNIPHYL) 400 MG 24 hr tablet Take 0.5 tablets (200 mg total) by mouth 2 (two) times daily. 90 tablet 1  . traMADol (ULTRAM) 50 MG tablet Take 1 tablet (50 mg total) by mouth every 6 (six) hours as needed for moderate pain. 90 tablet 0  . triamcinolone cream (KENALOG) 0.1 % Apply 1 application topically 2 (two) times daily. 30 g 0   No current facility-administered medications for this visit.    Allergies:   Simvastatin    Social History:  The patient  reports that she quit smoking about 30 years ago. She has a 23.00 pack-year smoking history. She has never used smokeless tobacco. She reports current alcohol use of about 14.0 standard drinks of alcohol per week. She reports that she does not use drugs.   Family History:  The patient's family history includes COPD in her sister; Diabetes in her sister; Heart attack in her father; Heart attack (age of onset: 47)  in her brother; Other in her son; Parkinsonism in her mother; Stroke in her mother.    ROS: All other systems are reviewed and negative. Unless otherwise mentioned in H&P    PHYSICAL EXAM: VS:  There were no vitals taken for this visit. , BMI There is no height or weight on file to calculate BMI. GEN: Well nourished, well developed, in no acute distress HEENT: normal Neck: no JVD, carotid bruits, or masses Cardiac: IRRR; no murmurs, rubs, or gallops, mild lower extremity edema  Respiratory:  Clear to auscultation bilaterally, normal work of breathing GI: soft,  nontender, nondistended, + BS MS: no deformity or atrophy Skin: warm and dry, no rash Neuro:  Strength and sensation are intact Psych: euthymic mood, full affect   EKG: Atrial fib with RVR heart rate 118 bpm, occasional PVC.  Recent Labs: 07/10/2018: B Natriuretic Peptide 123.0 02/05/2019: ALT 34 02/06/2019: BUN 7; Creatinine, Ser 0.72; Hemoglobin 11.9; Platelets 256; Potassium 4.5; Sodium 135; TSH 5.702    Lipid Panel    Component Value Date/Time   CHOL 312 (H) 01/02/2019 1212   CHOL 281 (H) 06/19/2014 1016   TRIG 128 02/05/2019 1600   TRIG 58 06/19/2014 1016   TRIG 51 06/01/2006 0950   HDL 166.30 01/02/2019 1212   HDL 153 06/19/2014 1016   CHOLHDL 2 01/02/2019 1212   VLDL 19.8 01/02/2019 1212   LDLCALC 126 (H) 01/02/2019 1212   LDLCALC 116 (H) 06/19/2014 1016   LDLDIRECT 116.6 06/13/2013 0921      Wt Readings from Last 3 Encounters:  02/05/19 124 lb 1.6 oz (56.3 kg)  01/17/19 127 lb (57.6 kg)  01/02/19 127 lb (57.6 kg)      Other studies Reviewed: Echocardiogram 05-10-18 Left ventricle: The cavity size was normal. Systolic function was   normal. The estimated ejection fraction was in the range of 55%   to 60%. Wall motion was normal; there were no regional wall   motion abnormalities. Normal sinus rhythm was absent. The study   was not technically sufficient to allow evaluation of LV   diastolic  dysfunction due to atrial fibrillation. - Mitral valve: There was mild to moderate regurgitation. - Left atrium: The atrium was moderately dilated. - Tricuspid valve: There was mild-moderate regurgitation. - Pulmonary arteries: PA peak pressure: 35 mm Hg (S).  ASSESSMENT AND PLAN:  1.  Paroxysmal atrial fib: She is back in atrial fibrillation today.  For unknown reason she had not been taking amiodarone for the last several months.  I do not see anywhere in our notes or in PCP notes where this has been discontinued.  I have spoken with Dr. Stanford Breed, who is onsite today concerning amiodarone reloading regimen.  His advisement is to begin her on amiodarone 400 mg twice daily for 1 week and then decrease her to 200 mg of amiodarone daily.  She has been compliant with her Eliquis, and has not missed any doses.  She is due to see him on follow-up on July 24, 2019 at which time EKG will be completed and discussion will be had on need to repeat cardioversion as she has been compliant with Eliquis.  Prior to being seetoday she had been advised to increase her diltiazem to 180 mg daily, this will not be not necessary now.  2.  Hypertension: Blood pressure is very well controlled currently.  No changes in her medication regimen at this time.  3.  Temporal arteriolitis: Causing blindness.     Current medicines are reviewed at length with the patient today.    Labs/ tests ordered today include: None  Phill Myron. [place] Pugh, ANP, AACC   07/09/2019 2:36 PM    [facility] [address] Office [phone] Fax [phone]  Notice: This dictation was prepared with Dragon dictation along with smaller phrase technology. Any transcriptional errors that result from this process are unintentional and may not be corrected upon review.

## 2019-07-13 ENCOUNTER — Ambulatory Visit: Payer: Medicare Other

## 2019-07-13 DIAGNOSIS — M25552 Pain in left hip: Secondary | ICD-10-CM | POA: Diagnosis not present

## 2019-07-13 DIAGNOSIS — S32040A Wedge compression fracture of fourth lumbar vertebra, initial encounter for closed fracture: Secondary | ICD-10-CM | POA: Diagnosis not present

## 2019-07-16 ENCOUNTER — Ambulatory Visit: Payer: Medicare Other | Admitting: Internal Medicine

## 2019-07-16 ENCOUNTER — Encounter: Payer: Self-pay | Admitting: Physical Therapy

## 2019-07-16 ENCOUNTER — Ambulatory Visit: Payer: Medicare Other | Attending: Internal Medicine | Admitting: Physical Therapy

## 2019-07-16 ENCOUNTER — Other Ambulatory Visit: Payer: Self-pay

## 2019-07-16 VITALS — BP 160/70 | HR 103

## 2019-07-16 DIAGNOSIS — H8111 Benign paroxysmal vertigo, right ear: Secondary | ICD-10-CM | POA: Diagnosis not present

## 2019-07-16 DIAGNOSIS — H8112 Benign paroxysmal vertigo, left ear: Secondary | ICD-10-CM | POA: Diagnosis not present

## 2019-07-16 DIAGNOSIS — R2681 Unsteadiness on feet: Secondary | ICD-10-CM | POA: Insufficient documentation

## 2019-07-16 DIAGNOSIS — R262 Difficulty in walking, not elsewhere classified: Secondary | ICD-10-CM

## 2019-07-16 DIAGNOSIS — R42 Dizziness and giddiness: Secondary | ICD-10-CM | POA: Insufficient documentation

## 2019-07-16 DIAGNOSIS — R296 Repeated falls: Secondary | ICD-10-CM | POA: Diagnosis not present

## 2019-07-16 NOTE — Therapy (Signed)
[facility] [address] [place], [place], [address] Phone: [phone]   Fax:  [phone]  Physical Therapy Treatment  Patient Details  Name: Crystal [name] MRN: [id number] Date of Birth: 21-Feb-1946 Referring Provider (PT): [name], MD   Encounter Date: 07/16/2019  PT End of Session - 07/16/19 1109    Visit Number  16    Number of Visits  21    Date for PT Re-Evaluation  08/06/19    Authorization Type  [employer] Medicare; $40 copay.  10th visit PN.  No VL    PT Start Time  1015    PT Stop Time  1105    PT Time Calculation (min)  50 min    Equipment Utilized During Treatment  Gait belt    Activity Tolerance  Other (comment);Patient tolerated treatment well   dizziness   Behavior During Therapy  Anxious;WFL for tasks assessed/performed       Past Medical History:  Diagnosis Date  . Adenomatous colon polyp 2007 & 2010    Dr [name]  . Allergy    seasonal  . Arthritis   . Cataract   . Diverticulosis   . 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 Hyperlipidemia   . Hypertension   . IBS (irritable bowel syndrome)   . Legally blind   . MVP (mitral valve prolapse)   . Paroxysmal atrial fibrillation with rapid ventricular response ([place]) 08/29/2017  . Peripheral vascular disease ([name])   . RAD (reactive airway disease)     Past Surgical History:  Procedure Laterality Date  . ABDOMINAL HYSTERECTOMY     with USO for dysfunctionall menses  . APPENDECTOMY    . ARTERY BIOPSY Bilateral 07/21/2017   Procedure: BIOPSY BILATERAL TEMPORAL ARTERY;  Surgeon: [name], MD;  Location: [facility];  Service: Vascular;  Laterality: Bilateral;  . CARDIOVERSION N/A 06/16/2018   Procedure: CARDIOVERSION;  Surgeon: [name], MD;  Location: [facility];  Service: Cardiovascular;  Laterality: N/A;  . CATARACT EXTRACTION W/ INTRAOCULAR LENS IMPLANT  2013   bilateral; Dr [name]  . COLONOSCOPY  2013   negative ,  due 2018; Dr [name]  . COLONOSCOPY W/ POLYPECTOMY      X2 ; diverticulosis  . EMBOLECTOMY Right 08/29/2017   Procedure: Right Groin Exploration  ;  Surgeon: Rosetta Posner, MD;  Location: [facility];  Service: Vascular;  Laterality: Right;  . HIP PINNING,CANNULATED Right 03/21/2018  . HIP PINNING,CANNULATED Right 03/21/2018   Procedure: CANNULATED HIP PINNING;  Surgeon: Rod Can, MD;  Location: [facility];  Service: Orthopedics;  Laterality: Right;  . LUMBAR LAMINECTOMY  2000   Dr Vertell Limber  . SHOULDER SURGERY     R shoulder  . TONSILLECTOMY      Vitals:   07/16/19 1024  BP: (!) 160/70  Pulse: (!) 103    Subjective Assessment - 07/16/19 1017    Subjective  Went to see cardiology last week who started her back on medication for afib; no longer in afib.  Had to get a cortisone injection for her back; gets an MRI tomorrow for her back.    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  Very shortened step length today.  HR after ambulation 130 bpm and 02: 80% with 2-3 minute required to lower HR and increase Sp02 - pt reports issues with asthma after steroid injection    Ambulation Distance (Feet)  115 Feet    Assistive device  Straight cane;Other (Comment)    Gait Pattern  Step-to pattern;Decreased step length -  right;Decreased step length - left;Decreased stride length;Shuffle;Trunk flexed;Decreased trunk rotation    Ambulation Surface  Level;Indoor    Stairs  Yes    Stairs Assistance  4: Min assist    Stairs Assistance Details (indicate cue type and reason)  pt leads with RLE due to back pain on LE; cues to use cane to judge height of stairs    Stair Management Technique  One rail Right;Forwards;Step to pattern;With cane    Number of Stairs  4    Height of Stairs  6      Knee/Hip Exercises: Standing   Heel Raises  Both;1 set;10 reps    Heel Raises Limitations  UE support    Other Standing Knee Exercises  One UE support, R and then LLE: 2 sets of stepping forwards, backwards and laterally stopping in the middle after each step      Knee/Hip Exercises: Seated   Sit to Sand  1 set;5 reps;without UE support   hands on thighs, min A            PT Education - 07/16/19 1109    Education Details  results of STG assessment    Person(s) Educated  Patient    Methods  Explanation    Comprehension  Verbalized understanding       PT Short Term Goals - 07/16/19 1110      PT SHORT TERM GOAL #1   Title  Patient will report independent ambulation at home with white cane and SPC with supervision only    Time  4    Period  Weeks    Status  Not Met    Target Date  07/07/19      PT SHORT TERM GOAL #2   Title  Patient will negotiate 8 stairs with railings & SPC with </= minA    Baseline  4 with one rail with SPC and min A    Time  4    Period  Weeks    Status  Partially Met    Target Date  07/07/19        PT Long Term Goals - 07/04/19 2022      PT LONG TERM GOAL #1   Title  Pt will demonstrate ability to perform final HEP with supervision. (all due 08/06/19)    Time  8    Period  Weeks    Status  On-going      PT LONG TERM GOAL #2   Title  Pt will demonstrate negative positional testing for multiple canals    Time  8    Period  Weeks    Status  On-going  PT LONG TERM GOAL #3    Title  Pt will report no spinning when performing rolling in bed and during supine <> sit    Time  8    Period  Weeks    Status  On-going      PT LONG TERM GOAL #4   Title  Pt will demonstrate ability to ambulate 200' outside over pavement and negotiate curb with white cane & SPC    Baseline  06/07/19: ambulated with [facility] & white cane with minA, negotiated 4 stairs with SPC and min-modA    Time  8    Period  Weeks    Status  Revised      PT LONG TERM GOAL #5   Title  Pt will decrease falls risk as indicated by increase in BERG by 4 points.    Baseline  06/07/19: 30/56    Time  8    Period  Weeks    Status  Revised            Plan - 07/16/19 1110    Clinical Impression Statement  Pt no longer in afib but continues to present with decreased activity tolerance as evidenced by increase in HR, decrease in Sp02 and elevated BP today - pt believes the steroid injection is affecting her asthma.  Due to no being able to assess STG last session, STG assessed today.  Pt did not meet goal for MOD I gait at home with cane and white cane - continues to require min A to perform due to balance impairments, back pain and significant fear of falling.  Pt partially met stair goal - able to perform 4 stairs with min A with cane but did not perform 8 today due to back pain.  Pt to have MRI of back tomorrow.  Continued to focus on dynamic standing balance, weight shifting and LE strengthening for use of ankle and hip strategies to continue to improve balance reactions to improve independence with transfers and gait.  Will continue to address in order to progress towards LTG.    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, IBS, HTN, HLD, femoral artery thrombosis, diverticulosis, cataract, OA, R shoulder surgery, lumbar  laminectomy, R cannulated hip surgery, and embolectomy    Examination-Activity Limitations  Bed Mobility;Locomotion Level;Stand;Transfers;Stairs    Examination-Participation Restrictions  Shop;Community Activity    Rehab Potential  Good    PT Frequency  1x / week    PT Duration  8 weeks    PT Treatment/Interventions  ADLs/Self Care Home Management;Canalith Repostioning;Gait training;Stair training;Functional mobility training;Therapeutic activities;Therapeutic exercise;Balance training;Neuromuscular re-education;Patient/family education;Vestibular    PT Next Visit Plan  PT IS COMPLETELY VISUALLY IMPAIRED - USES WHITE CANE. Check BP, HR and Sp02.  How was MRI?  Balance training-weight shifting, rockerboard, ankle and hip strategy training with decreased UE support (loses balance when she lifts her hands up to fix her hair).  Gait and stair training with white cane and regular cane for stability.    Consulted and Agree with Plan of Care  Patient;Family member/caregiver    Family Member Consulted  Husband       Patient will benefit from skilled therapeutic intervention in order to improve the following deficits and impairments:  Decreased balance, Difficulty walking, Dizziness  Visit Diagnosis: Dizziness and giddiness  Repeated falls  Unsteadiness on feet  Difficulty in walking, not elsewhere classified     Problem List Patient Active Problem List   Diagnosis Date Noted  . Vertigo of central origin 02/09/2019  . Other peripheral vertigo, unspecified ear 02/09/2019  . Nonintractable headache 02/09/2019  . Community acquired pneumonia 02/05/2019  . Hypothermia 02/05/2019  . CAP (community acquired pneumonia) 02/05/2019  . Pruritus 01/02/2019  . Rib pain on left side 08/09/2018  . Hypokalemia 08/09/2018  . Long term (current) use of anticoagulants 08/01/2018  . Pre-operative clearance 08/01/2018  . Closed compression fracture of L4 vertebra ([place]) 07/11/2018  . Bilateral leg edema  07/11/2018  . Lung nodule < 6cm on CT 07/11/2018  . Subclinical hypothyroidism 04/11/2018  . Fracture of femoral neck, right ([place]) 03/22/2018  . PAF (paroxysmal atrial fibrillation) ([place]) 03/18/2018  . 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 Herpes zoster without complication 69/43/7005  . Jaw claudication 07/27/2017  . Temporal arteritis ([place]) 07/18/2017  . Vision, loss, sudden, bilateral 07/18/2017  . 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 Osteoarthritis 06/25/2016  . Anxiety 06/25/2016  . Back pain 07/18/2015  . 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 Carotid bruit present 06/05/2010  . Osteoporosis 06/03/2008  . History of colonic polyps 06/03/2008  . INTENTION TREMOR 12/06/2007  . IBS 10/16/2007  . Hyperlipidemia 06/06/2007  . Essential hypertension 06/06/2007  . Chronic obstructive airway disease with asthma ([place]) 02/23/2007    Rico Junker, PT, DPT 07/16/19    11:17 AM    [facility] [address] [place], [place], [address] Phone: [phone]   Fax:  [phone]  Name: Crystal [name] MRN: [id number] Date of Birth: 1946-02-08

## 2019-07-17 ENCOUNTER — Ambulatory Visit (INDEPENDENT_AMBULATORY_CARE_PROVIDER_SITE_OTHER): Payer: Medicare Other | Admitting: Internal Medicine

## 2019-07-17 ENCOUNTER — Other Ambulatory Visit: Payer: Medicare Other

## 2019-07-17 ENCOUNTER — Encounter: Payer: Self-pay | Admitting: Internal Medicine

## 2019-07-17 VITALS — BP 156/68 | HR 87 | Temp 98.2°F | Resp 20 | Ht 64.0 in

## 2019-07-17 DIAGNOSIS — I10 Essential (primary) hypertension: Secondary | ICD-10-CM | POA: Diagnosis not present

## 2019-07-17 DIAGNOSIS — R918 Other nonspecific abnormal finding of lung field: Secondary | ICD-10-CM

## 2019-07-17 DIAGNOSIS — M8000XS Age-related osteoporosis with current pathological fracture, unspecified site, sequela: Secondary | ICD-10-CM

## 2019-07-17 DIAGNOSIS — J449 Chronic obstructive pulmonary disease, unspecified: Secondary | ICD-10-CM | POA: Diagnosis not present

## 2019-07-17 DIAGNOSIS — F419 Anxiety disorder, unspecified: Secondary | ICD-10-CM | POA: Diagnosis not present

## 2019-07-17 DIAGNOSIS — M159 Polyosteoarthritis, unspecified: Secondary | ICD-10-CM

## 2019-07-17 DIAGNOSIS — IMO0001 Reserved for inherently not codable concepts without codable children: Secondary | ICD-10-CM

## 2019-07-17 DIAGNOSIS — E039 Hypothyroidism, unspecified: Secondary | ICD-10-CM | POA: Diagnosis not present

## 2019-07-17 DIAGNOSIS — D729 Disorder of white blood cells, unspecified: Secondary | ICD-10-CM

## 2019-07-17 DIAGNOSIS — M8949 Other hypertrophic osteoarthropathy, multiple sites: Secondary | ICD-10-CM

## 2019-07-17 DIAGNOSIS — M545 Low back pain: Secondary | ICD-10-CM | POA: Diagnosis not present

## 2019-07-17 DIAGNOSIS — R600 Localized edema: Secondary | ICD-10-CM

## 2019-07-17 DIAGNOSIS — I480 Paroxysmal atrial fibrillation: Secondary | ICD-10-CM

## 2019-07-17 DIAGNOSIS — R911 Solitary pulmonary nodule: Secondary | ICD-10-CM

## 2019-07-17 LAB — COMPREHENSIVE METABOLIC PANEL
ALT: 15 U/L (ref 0–35)
AST: 22 U/L (ref 0–37)
Albumin: 4.1 g/dL (ref 3.5–5.2)
Alkaline Phosphatase: 95 U/L (ref 39–117)
BUN: 7 mg/dL (ref 6–23)
CO2: 25 mEq/L (ref 19–32)
Calcium: 9 mg/dL (ref 8.4–10.5)
Chloride: 99 mEq/L (ref 96–112)
Creatinine, Ser: 0.73 mg/dL (ref 0.40–1.20)
GFR: 77.63 mL/min (ref >60.00)
Glucose, Bld: 89 mg/dL (ref 70–99)
Potassium: 3.9 mEq/L (ref 3.5–5.1)
Sodium: 136 mEq/L (ref 135–145)
Total Bilirubin: 0.8 mg/dL (ref 0.2–1.2)
Total Protein: 6.7 g/dL (ref 6.0–8.3)

## 2019-07-17 LAB — VITAMIN D 25 HYDROXY (VIT D DEFICIENCY, FRACTURES): VITD: 28.23 ng/mL — ABNORMAL LOW (ref 30.00–100.00)

## 2019-07-17 LAB — CBC WITH DIFFERENTIAL/PLATELET
Basophils Absolute: 0.2 10*3/uL — ABNORMAL HIGH (ref 0.0–0.1)
Basophils Relative: 1.5 % (ref 0.0–3.0)
Eosinophils Absolute: 0.8 10*3/uL — ABNORMAL HIGH (ref 0.0–0.7)
Eosinophils Relative: 6.9 % — ABNORMAL HIGH (ref 0.0–5.0)
HCT: 39.2 % (ref 36.0–46.0)
Hemoglobin: 12.5 g/dL (ref 12.0–15.0)
Lymphocytes Relative: 5.8 % — ABNORMAL LOW (ref 12.0–46.0)
Lymphs Abs: 0.7 10*3/uL (ref 0.7–4.0)
MCHC: 31.9 g/dL (ref 30.0–36.0)
MCV: 111.7 fl — ABNORMAL HIGH (ref 78.0–100.0)
Monocytes Absolute: 1.4 10*3/uL — ABNORMAL HIGH (ref 0.1–1.0)
Monocytes Relative: 11.7 % (ref 3.0–12.0)
Neutro Abs: 9 10*3/uL — ABNORMAL HIGH (ref 1.4–7.7)
Neutrophils Relative %: 74.1 % (ref 43.0–77.0)
Platelets: 338 10*3/uL (ref 150.0–400.0)
RBC: 3.51 Mil/uL — ABNORMAL LOW (ref 3.87–5.11)
RDW: 17.6 % — ABNORMAL HIGH (ref 11.5–15.5)
WBC: 12.2 10*3/uL — ABNORMAL HIGH (ref 4.0–10.5)

## 2019-07-17 LAB — TSH: TSH: 3.38 u[IU]/mL (ref 0.35–4.50)

## 2019-07-17 NOTE — Assessment & Plan Note (Signed)
Chronic Has osteoarthritis in several joints and back Takes tramadol and Tylenol as needed Medication is effective and well-tolerated Continue above

## 2019-07-17 NOTE — Assessment & Plan Note (Signed)
Chronic Blood pressure high here today, very well controlled last month She was just restarted back on amiodarone and has a follow-up with cardiology soon No changes in medication today CMP

## 2019-07-17 NOTE — Patient Instructions (Addendum)
  Blood work was ordered.   ° ° °Medications reviewed and updated.  Changes include :   none ° ° ° °Please followup in 6 months ° ° °

## 2019-07-17 NOTE — Assessment & Plan Note (Signed)
Chronic Overall she feels her bilateral lower extremity edema is fairly controlled Has not taken Lasix as regular recently secondary to doctor's appointments Continue Lasix 40 mg daily and potassium CMP

## 2019-07-17 NOTE — Assessment & Plan Note (Signed)
She is due for follow-up of lung nodule in the right midlung CT of the chest without contrast ordered

## 2019-07-17 NOTE — Assessment & Plan Note (Signed)
Chronic  Clinically euthyroid Check tsh  Titrate med dose if needed  

## 2019-07-17 NOTE — Progress Notes (Signed)
Subjective:    Patient ID: Crystal [name], female    DOB: 1946/05/05, 74 y.o.   MRN: [id number]  HPI The patient is here for follow up of their chronic medical problems, including Afib, hypertension, b/l leg edema, anxiety, hypothyroidism, chronic obstructive asthma, OA with chronic back pain from arthritis and compression fracture.  She is taking all of her medications as prescribed.     She is due for a CT of her lung for follow up of a RML nodule seen 06/2018.  Her Leg edema some days is better than others.  Overall she feels it is controlled.  Last week she did have a cortisone injection for her back.  She continues to have chronic back pain, but it did increase.  She will be having an MRI to evaluate further.  Her atrial fibrillation returned and she did see cardiology and was restarted on amiodarone.  The vertigo has resolved with PT.  She continues to have some balance problems and she continues to do physical therapy, which she finds helpful.   Medications and allergies reviewed with patient and updated if appropriate.  Patient Active Problem List   Diagnosis Date Noted  . Vertigo of central origin 02/09/2019  . Other peripheral vertigo, unspecified ear 02/09/2019  . Nonintractable headache 02/09/2019  . Pruritus 01/02/2019  . Hypokalemia 08/09/2018  . Long term (current) use of anticoagulants 08/01/2018  . Pre-operative clearance 08/01/2018  . Closed compression fracture of L4 vertebra ([place]) 07/11/2018  . Bilateral leg edema 07/11/2018  . Lung nodule < 6cm on CT 07/11/2018  . Hypothyroidism (acquired) 04/11/2018  . Fracture of femoral neck, right ([place]) 03/22/2018  . PAF (paroxysmal atrial fibrillation) ([place]) 03/18/2018  . 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 Herpes zoster without complication [id number]  . Jaw claudication 07/27/2017  . Temporal arteritis ([place]) 07/18/2017  . Vision, loss, sudden, bilateral 07/18/2017  . 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  Osteoarthritis 06/25/2016  . Anxiety 06/25/2016  . Back pain 07/18/2015  . 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 Carotid bruit present 06/05/2010  . Osteoporosis 06/03/2008  . History of colonic polyps 06/03/2008  . INTENTION TREMOR 12/06/2007  . IBS 10/16/2007  . Hyperlipidemia 06/06/2007  . Essential hypertension 06/06/2007  . Chronic obstructive airway disease with asthma ([place]) 02/23/2007    Current Outpatient Medications on File Prior to Visit  Medication Sig Dispense Refill  . albuterol (VENTOLIN HFA) 108 (90 Base) MCG/ACT inhaler INHALE 1 PUFF INTO THE LUNGS AS DIRECTED IF NEEDED 8.5 g 0  . alendronate (FOSAMAX) 70 MG tablet Take 70 mg by mouth every Monday.     Marland Kitchen amiodarone (PACERONE) 200 MG tablet Take 400mg  Twice Daily for 1 week and then take 200mg  daily after. 90 tablet 3  . amoxicillin (AMOXIL) 500 MG tablet Take 4 tablets by mouth as needed. Pt take 4 tablets prior dental work    . Calcium Citrate-Vitamin D (CALCIUM + D PO) Take 1 tablet by mouth daily.    . Cholecalciferol (VITAMIN D3) 1000 units CAPS Take 1 capsule (1,000 Units total) by mouth daily with lunch. 60 capsule 0  . diltiazem (CARDIZEM CD) 120 MG 24 hr capsule Take 1 capsule (120 mg total) by mouth daily. 90 capsule 1  . ELIQUIS 5 MG TABS tablet TAKE 1 TABLET BY MOUTH TWO TIMES A DAY 180 tablet 1  . furosemide (LASIX) 40 MG tablet Take 1 tablet (40 mg total) by mouth daily. 90 tablet 1  . gabapentin (NEURONTIN) 100  MG capsule Take 5 capsules (500 mg total) by mouth at bedtime. 30 capsule 0  . KLOR-CON M20 20 MEQ tablet TAKE TWO TABLETS BY MOUTH DAILY 180 tablet 0  . levothyroxine (SYNTHROID) 50 MCG tablet Take 1 tablet (50 mcg total) by mouth daily. 90 tablet 3  . LORazepam (ATIVAN) 1 MG tablet TAKE ONE TABLET BY MOUTH EVERY NIGHT AT BEDTIME 30 tablet 5  . methocarbamol (ROBAXIN) 500 MG tablet TAKE ONE TABLET BY MOUTH EVERY 6 HOURS AS NEEDED FOR MUSCLE SPASMS 120 tablet 1  . montelukast (SINGULAIR) 10 MG tablet Take 1  tablet (10 mg total) by mouth at bedtime. 90 tablet 1  . predniSONE (DELTASONE) 5 MG tablet Take 5 mg by mouth daily with breakfast.     . SYMBICORT 160-4.5 MCG/ACT inhaler INHALE ONE TO TWO PUFFS BY MOUTH EVERY 12 HOURS, THEN GARGLE AND SPIT AFTER USE 10.2 g 9  . theophylline (UNIPHYL) 400 MG 24 hr tablet Take 0.5 tablets (200 mg total) by mouth 2 (two) times daily. 90 tablet 1  . traMADol (ULTRAM) 50 MG tablet Take 1 tablet (50 mg total) by mouth every 6 (six) hours as needed for moderate pain. 90 tablet 0  . triamcinolone cream (KENALOG) 0.1 % Apply 1 application topically 2 (two) times daily. 30 g 0   No current facility-administered medications on file prior to visit.    Past Medical History:  Diagnosis Date  . Adenomatous colon polyp 2007 & 2010    Dr [name]  . Allergy    seasonal  . Arthritis   . Cataract   . Diverticulosis   . 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 Hyperlipidemia   . Hypertension   . IBS (irritable bowel syndrome)   . Legally blind   . MVP (mitral valve prolapse)   . Paroxysmal atrial fibrillation with rapid ventricular response ([place]) 08/29/2017  . Peripheral vascular disease ([place])   . RAD (reactive airway disease)     Past Surgical History:  Procedure Laterality Date  . ABDOMINAL HYSTERECTOMY     with USO for dysfunctionall menses  . APPENDECTOMY    . ARTERY BIOPSY Bilateral 07/21/2017   Procedure: BIOPSY BILATERAL TEMPORAL ARTERY;  Surgeon: [name], MD;  Location: [facility];  Service: Vascular;  Laterality: Bilateral;  . CARDIOVERSION N/A 06/16/2018   Procedure: CARDIOVERSION;  Surgeon: [name], MD;  Location: [facility];  Service: Cardiovascular;  Laterality: N/A;  . CATARACT EXTRACTION W/ INTRAOCULAR LENS IMPLANT  2013   bilateral; Dr [name]  . COLONOSCOPY  2013   negative , due 2018; Dr [name]  . COLONOSCOPY W/ POLYPECTOMY      X2 ; diverticulosis  . EMBOLECTOMY Right 08/29/2017   Procedure: Right Groin  Exploration  ;  Surgeon: Rosetta Posner, MD;  Location: [facility];  Service: Vascular;  Laterality: Right;  . HIP PINNING,CANNULATED Right 03/21/2018  . HIP PINNING,CANNULATED Right 03/21/2018   Procedure: CANNULATED HIP PINNING;  Surgeon: Rod Can, MD;  Location: [facility];  Service: Orthopedics;  Laterality: Right;  . LUMBAR LAMINECTOMY  2000   Dr Vertell Limber  . SHOULDER SURGERY     R shoulder  . TONSILLECTOMY      Social History   Socioeconomic History  . Marital status: Married    Spouse name: Not on file  . Number of children: Not on file  . Years of education: Not on file  . Highest education level: Not on file  Occupational History  . Occupation:  retired  Tobacco Use  . Smoking status: Former Smoker    Packs/day: 1.00    Years: 23.00    Pack years: 23.00    Quit date: 06/14/1989    Years since quitting: 30.1  . Smokeless tobacco: Never Used  . Tobacco comment: smoked 1961-1991 , up to < 1 ppd  Substance and Sexual Activity  . Alcohol use: Yes    Alcohol/week: 14.0 standard drinks    Types: 14 Glasses of wine per week    Comment: Wine  . Drug use: No  . Sexual activity: Not on file  Other Topics Concern  . Not on file  Social History Narrative  . Not on file   Social Determinants of Health   Financial Resource Strain:   . Difficulty of Paying Living Expenses: Not on file  Food Insecurity:   . Worried About Charity fundraiser in the Last Year: Not on file  . Ran Out of Food in the Last Year: Not on file  Transportation Needs:   . Lack of Transportation (Medical): Not on file  . Lack of Transportation (Non-Medical): Not on file  Physical Activity:   . Days of Exercise per Week: Not on file  . Minutes of Exercise per Session: Not on file  Stress:   . Feeling of Stress : Not on file  Social Connections:   . Frequency of Communication with Friends and Family: Not on file  . Frequency of Social Gatherings with Friends and Family: Not on file  . Attends Religious  Services: Not on file  . Active Member of Clubs or Organizations: Not on file  . Attends Archivist Meetings: Not on file  . Marital Status: Not on file    Family History  Problem Relation Age of Onset  . Heart attack Brother 46  . Parkinsonism Mother   . Stroke Mother   . Heart attack Father        pre 14  . Diabetes Sister   . COPD Sister        X 3  . Other Son        suicide 2016  . Colon cancer Neg Hx   . Cancer Neg Hx   . Esophageal cancer Neg Hx   . Liver cancer Neg Hx   . Pancreatic cancer Neg Hx   . Rectal cancer Neg Hx   . Stomach cancer Neg Hx     Review of Systems  Constitutional: Negative for chills and fever.  Respiratory: Negative for cough, shortness of breath and wheezing.   Cardiovascular: Positive for leg swelling. Negative for chest pain and palpitations.  Musculoskeletal: Positive for back pain.  Neurological: Positive for headaches (better).       Objective:   Vitals:   07/17/19 1027  BP: (!) 156/68  Pulse: 87  Resp: 20  Temp: 98.2 F (36.8 C)  SpO2: 93%   BP Readings from Last 3 Encounters:  07/17/19 (!) 156/68  07/16/19 (!) 160/70  07/09/19 110/60   Wt Readings from Last 3 Encounters:  07/09/19 133 lb (60.3 kg)  02/05/19 124 lb 1.6 oz (56.3 kg)  01/17/19 127 lb (57.6 kg)   Body mass index is 22.83 kg/m.   Physical Exam    Constitutional: Appears well-developed and well-nourished. No distress.  HENT:  Head: Normocephalic and atraumatic.  Neck: Neck supple. No tracheal deviation present. No thyromegaly present.  No cervical lymphadenopathy Cardiovascular: Normal rate, regular rhythm and normal heart sounds.  No murmur heard. No carotid bruit .  No edema Pulmonary/Chest: Effort normal and breath sounds normal. No respiratory distress. No has no wheezes. No rales.  Skin: Skin is warm and dry. Not diaphoretic.  Psychiatric: Normal mood and affect. Behavior is normal.      Assessment & Plan:    See Problem List  for Assessment and Plan of chronic medical problems.    This visit occurred during the SARS-CoV-2 public health emergency.  Safety protocols were in place, including screening questions prior to the visit, additional usage of staff PPE, and extensive cleaning of exam room while observing appropriate contact time as indicated for disinfecting solutions.

## 2019-07-17 NOTE — Assessment & Plan Note (Signed)
Went back into A. fib and saw cardiology and was restarted on amiodarone Taking diltiazem, Eliquis In sinus rhythm here today CBC, CMP, TSH

## 2019-07-17 NOTE — Assessment & Plan Note (Signed)
Chronic, stable Asthma has been well controlled Continue theophylline, Singulair and albuterol as needed

## 2019-07-17 NOTE — Assessment & Plan Note (Signed)
Chronic Taking Fosamax prescribed by rheumatology We will check vitamin D level, which she is taking daily

## 2019-07-17 NOTE — Progress Notes (Signed)
HPI: FU atrial fibrillation. 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 Patient had a right buttock subcutaneous hematoma September 2019. Patient fell October 2019 and had a subcapital fracture of the right hip. She was in atrial fibrillation with rapid ventricular response at that time. She underwent repair of her hip fracture and was placed on amiodarone.Had cardioversion June 16, 2018.Carotid Dopplers March 2020 showed no stenosis on the right and 1 to 39% stenosis on the left. Chest CT August 2020 showed interstitial lung disease, 7 mm right middle lobe nodule and follow-up recommended 6 to 12 months. Seen for recurrent atrial fibrillation 1/21 and started bach on amiodarone (DCed by mistake). Since last seen,patient denies dyspnea, chest pain, palpitations or syncope.  No bleeding.  Current Outpatient Medications  Medication Sig Dispense Refill  . albuterol (VENTOLIN HFA) 108 (90 Base) MCG/ACT inhaler INHALE 1 PUFF INTO THE LUNGS AS DIRECTED IF NEEDED 8.5 g 0  . alendronate (FOSAMAX) 70 MG tablet Take 70 mg by mouth every Monday.     Marland Kitchen amiodarone (PACERONE) 200 MG tablet Take 400mg  Twice Daily for 1 week and then take 200mg  daily after. 90 tablet 3  . amoxicillin (AMOXIL) 500 MG tablet Take 4 tablets by mouth as needed. Pt take 4 tablets prior dental work    . Calcium Citrate-Vitamin D (CALCIUM + D  PO) Take 1 tablet by mouth daily.    . Cholecalciferol (VITAMIN D3) 1000 units CAPS Take 1 capsule (1,000 Units total) by mouth daily with lunch. 60 capsule 0  . diltiazem (CARDIZEM CD) 120 MG 24 hr capsule Take 1 capsule (120 mg total) by mouth daily. 90 capsule 1  . ELIQUIS 5 MG TABS tablet TAKE 1 TABLET BY MOUTH TWO TIMES A DAY 180 tablet 1  . furosemide (LASIX) 40 MG tablet Take 1 tablet (40 mg total) by mouth daily. 90 tablet 1  . gabapentin (NEURONTIN) 100 MG capsule Take 5 capsules (500 mg total) by mouth at bedtime. 30 capsule 0  . KLOR-CON M20 20 MEQ tablet TAKE TWO TABLETS BY MOUTH DAILY 180 tablet 0  . levothyroxine (SYNTHROID) 50 MCG tablet Take 1 tablet (50 mcg total) by mouth daily. 90 tablet 3  . LORazepam (ATIVAN) 1 MG tablet TAKE ONE TABLET BY MOUTH EVERY NIGHT AT BEDTIME 30 tablet 5  . methocarbamol (ROBAXIN) 500 MG tablet TAKE ONE TABLET BY MOUTH EVERY 6 HOURS AS NEEDED FOR MUSCLE SPASMS 120 tablet 1  . montelukast (SINGULAIR) 10 MG tablet Take 1 tablet (10 mg total) by mouth at bedtime. 90 tablet 1  . predniSONE (DELTASONE) 5 MG tablet Take 5 mg by mouth daily with breakfast.     . SYMBICORT 160-4.5 MCG/ACT inhaler INHALE ONE TO TWO PUFFS BY MOUTH EVERY 12 HOURS, THEN GARGLE AND SPIT AFTER USE 10.2 g 9  . theophylline (UNIPHYL) 400 MG 24 hr tablet Take 0.5 tablets (200 mg total) by mouth 2 (two) times daily. 90 tablet 1  . traMADol (ULTRAM)  50 MG tablet Take 1 tablet (50 mg total) by mouth every 6 (six) hours as needed for moderate pain. 90 tablet 0  . triamcinolone cream (KENALOG) 0.1 % Apply 1 application topically 2 (two) times daily. 30 g 0   No current facility-administered medications for this visit.     Past Medical History:  Diagnosis Date  . Adenomatous colon polyp 2007 & 2010    Dr [name]  . Allergy    seasonal  . Arthritis   . Cataract   . Diverticulosis   . 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 Hyperlipidemia   . Hypertension   . IBS  (irritable bowel syndrome)   . Legally blind   . MVP (mitral valve prolapse)   . Paroxysmal atrial fibrillation with rapid ventricular response ([place]) 08/29/2017  . Peripheral vascular disease ([place])   . RAD (reactive airway disease)     Past Surgical History:  Procedure Laterality Date  . ABDOMINAL HYSTERECTOMY     with USO for dysfunctionall menses  . APPENDECTOMY    . ARTERY BIOPSY Bilateral 07/21/2017   Procedure: BIOPSY BILATERAL TEMPORAL ARTERY;  Surgeon: [name], MD;  Location: [facility];  Service: Vascular;  Laterality: Bilateral;  . CARDIOVERSION N/A 06/16/2018   Procedure: CARDIOVERSION;  Surgeon: [name], MD;  Location: [facility];  Service: Cardiovascular;  Laterality: N/A;  . CATARACT EXTRACTION W/ INTRAOCULAR LENS IMPLANT  2013   bilateral; Dr [name]  . COLONOSCOPY  2013   negative , due 2018; Dr [name]  . COLONOSCOPY W/ POLYPECTOMY      X2 ; diverticulosis  . EMBOLECTOMY Right 08/29/2017   Procedure: Right Groin Exploration  ;  Surgeon: Rosetta Posner, MD;  Location: [facility];  Service: Vascular;  Laterality: Right;  . HIP PINNING,CANNULATED Right 03/21/2018  . HIP PINNING,CANNULATED Right 03/21/2018   Procedure: CANNULATED HIP PINNING;  Surgeon: Rod Can, MD;  Location: [facility];  Service: Orthopedics;  Laterality: Right;  . LUMBAR LAMINECTOMY  2000   Dr Vertell Limber  . SHOULDER SURGERY     R shoulder  . TONSILLECTOMY      Social History   Socioeconomic History  . Marital status: Married    Spouse name: Not on file  . Number of children: Not on file  . Years of education: Not on file  . Highest education level: Not on file  Occupational History  . Occupation: retired  Tobacco Use  . Smoking status: Former Smoker    Packs/day: 1.00    Years: 23.00    Pack years: 23.00    Quit date: 06/14/1989    Years since quitting: 30.1  . Smokeless tobacco: Never Used  . Tobacco comment: smoked 1961-1991 , up to < 1 ppd  Substance and Sexual Activity  .  Alcohol use: Yes    Alcohol/week: 14.0 standard drinks    Types: 14 Glasses of wine per week    Comment: Wine  . Drug use: No  . Sexual activity: Not on file  Other Topics Concern  . Not on file  Social History Narrative  . Not on file   Social Determinants of Health   Financial Resource Strain:   . Difficulty of Paying Living Expenses: Not on file  Food Insecurity:   . Worried About Charity fundraiser in the Last Year: Not on file  . Ran Out of Food in the Last Year: Not on file  Transportation Needs:   . Lack of Transportation (Medical): Not  on file  . Lack of Transportation (Non-Medical): Not on file  Physical Activity:   . Days of Exercise per Week: Not on file  . Minutes of Exercise per Session: Not on file  Stress:   . Feeling of Stress : Not on file  Social Connections:   . Frequency of Communication with Friends and Family: Not on file  . Frequency of Social Gatherings with Friends and Family: Not on file  . Attends Religious Services: Not on file  . Active Member of Clubs or Organizations: Not on file  . Attends Archivist Meetings: Not on file  . Marital Status: Not on file  Intimate Partner Violence:   . Fear of Current or Ex-Partner: Not on file  . Emotionally Abused: Not on file  . Physically Abused: Not on file  . Sexually Abused: Not on file    Family History  Problem Relation Age of Onset  . Heart attack Brother 73  . Parkinsonism Mother   . Stroke Mother   . Heart attack Father        pre 58  . Diabetes Sister   . COPD Sister        X 3  . Other Son        suicide 2016  . Colon cancer Neg Hx   . Cancer Neg Hx   . Esophageal cancer Neg Hx   . Liver cancer Neg Hx   . Pancreatic cancer Neg Hx   . Rectal cancer Neg Hx   . Stomach cancer Neg Hx     ROS: no fevers or chills, productive cough, hemoptysis, dysphasia, odynophagia, melena, hematochezia, dysuria, hematuria, rash, seizure activity, orthopnea, PND, pedal edema, claudication.  Remaining systems are negative.  Physical Exam: Well-developed well-nourished in no acute distress.  Skin is warm and dry.  HEENT is normal.  Neck is supple.  Chest is clear to auscultation with normal expansion.  Cardiovascular exam is regular rate and rhythm.  Abdominal exam nontender or distended. No masses palpated. Extremities show no edema. neuro grossly intact  ECG-normal sinus rhythm at a rate of 90, cannot rule out prior anterior infarct, left anterior fascicular block.  Personally reviewed  A/P  1 paroxysmal atrial fibrillation-patient back in sinus rhythm today.  Continue amiodarone; decrease to 200 mg daily.  She had mistakenly discontinued this medicine in the past.  We will check TSH, liver functions and chest x-ray in approximately 3-4 months. Continue apixaban.  2 hypertension-blood pressure elevated; I have asked him to track this at home.  We will advance Cardizem if needed.  3 pulmonary nodule-patient is scheduled for CT in February by her report.  This is being arranged by Dr. [name].  4 hyperlipidemia-continue statin.  5 chronic SFA occlusion-patient is not having claudication.  Continue medical therapy.  6 giant cell arteritis-Per primary care.  [name], MD

## 2019-07-17 NOTE — Assessment & Plan Note (Signed)
Chronic Ativan is helpful, but still has some anxiety Continue Ativan at night

## 2019-07-18 ENCOUNTER — Encounter: Payer: Self-pay | Admitting: Internal Medicine

## 2019-07-18 DIAGNOSIS — S32040A Wedge compression fracture of fourth lumbar vertebra, initial encounter for closed fracture: Secondary | ICD-10-CM | POA: Diagnosis not present

## 2019-07-18 LAB — PATHOLOGIST SMEAR REVIEW

## 2019-07-21 ENCOUNTER — Ambulatory Visit: Payer: Medicare Other | Attending: Internal Medicine

## 2019-07-21 DIAGNOSIS — Z23 Encounter for immunization: Secondary | ICD-10-CM

## 2019-07-21 NOTE — Progress Notes (Signed)
   Covid-19 Vaccination Clinic  Name:  Crystal [name]    MRN: [id number] DOB: 01/12/1946  07/21/2019  [name] was observed post Covid-19 immunization for 15 minutes without incidence. She was provided with Vaccine Information Sheet and instruction to access the V-Safe system.   [name] was instructed to call 911 with any severe reactions post vaccine: Marland Kitchen Difficulty breathing  . Swelling of your face and throat  . A fast heartbeat  . A bad rash all over your body  . Dizziness and weakness    Immunizations Administered    Name Date Dose VIS Date Route   Pfizer COVID-19 Vaccine 07/21/2019  2:48 PM 0.3 mL 05/25/2019 Intramuscular   Manufacturer: [place]   Lot: [id number]   [place]: [id number]

## 2019-07-23 ENCOUNTER — Other Ambulatory Visit: Payer: Self-pay

## 2019-07-23 ENCOUNTER — Ambulatory Visit: Payer: Medicare Other | Admitting: Physical Therapy

## 2019-07-23 DIAGNOSIS — R262 Difficulty in walking, not elsewhere classified: Secondary | ICD-10-CM | POA: Diagnosis not present

## 2019-07-23 DIAGNOSIS — H8112 Benign paroxysmal vertigo, left ear: Secondary | ICD-10-CM

## 2019-07-23 DIAGNOSIS — R42 Dizziness and giddiness: Secondary | ICD-10-CM

## 2019-07-23 DIAGNOSIS — R2681 Unsteadiness on feet: Secondary | ICD-10-CM | POA: Diagnosis not present

## 2019-07-23 DIAGNOSIS — H8111 Benign paroxysmal vertigo, right ear: Secondary | ICD-10-CM | POA: Diagnosis not present

## 2019-07-23 DIAGNOSIS — R296 Repeated falls: Secondary | ICD-10-CM

## 2019-07-23 NOTE — Therapy (Signed)
[facility] [address] [place], [place], [address] Phone: [phone]   Fax:  [phone]  Physical Therapy Treatment  Patient Details  Name: Crystal [name] MRN: [id number] Date of Birth: April 29, 1946 Referring Provider (PT): [name], MD   Encounter Date: 07/23/2019  PT End of Session - 07/23/19 1411    Visit Number  17    Number of Visits  21    Date for PT Re-Evaluation  08/06/19    Authorization Type  [employer] Medicare; $40 copay.  10th visit PN.  No VL    PT Start Time  1017    PT Stop Time  1101    PT Time Calculation (min)  44 min    Activity Tolerance  Patient tolerated treatment well    Behavior During Therapy  Anxious;WFL for tasks assessed/performed       Past Medical History:  Diagnosis Date  . Adenomatous colon polyp 2007 & 2010    Dr [name]  . Allergy    seasonal  . Arthritis   . Cataract   . Diverticulosis   . 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 Hyperlipidemia   . Hypertension   . IBS (irritable bowel syndrome)   . Legally blind   . MVP (mitral valve prolapse)   . Paroxysmal atrial fibrillation with rapid ventricular response ([place]) 08/29/2017  . Peripheral vascular disease ([place])   . RAD (reactive airway disease)     Past Surgical History:  Procedure Laterality Date  . ABDOMINAL HYSTERECTOMY     with USO for dysfunctionall menses  . APPENDECTOMY    . ARTERY BIOPSY Bilateral 07/21/2017   Procedure: BIOPSY BILATERAL TEMPORAL ARTERY;  Surgeon: [name], MD;  Location: [facility];  Service: Vascular;  Laterality: Bilateral;  . CARDIOVERSION N/A 06/16/2018   Procedure: CARDIOVERSION;  Surgeon: [name], MD;  Location: [facility];  Service: Cardiovascular;  Laterality: N/A;  . CATARACT EXTRACTION W/ INTRAOCULAR LENS IMPLANT  2013   bilateral; Dr [name]  . COLONOSCOPY  2013   negative , due 2018; Dr [name]  . COLONOSCOPY W/ POLYPECTOMY      X2 ; diverticulosis  .  EMBOLECTOMY Right 08/29/2017   Procedure: Right Groin Exploration  ;  Surgeon: Rosetta Posner, MD;  Location: [facility];  Service: Vascular;  Laterality: Right;  . HIP PINNING,CANNULATED Right 03/21/2018  . HIP PINNING,CANNULATED Right 03/21/2018   Procedure: CANNULATED HIP PINNING;  Surgeon: Rod Can, MD;  Location: [facility];  Service: Orthopedics;  Laterality: Right;  . LUMBAR LAMINECTOMY  2000   Dr Vertell Limber  . SHOULDER SURGERY     R shoulder  . TONSILLECTOMY      There were no vitals filed for this visit.  Subjective Assessment - 07/23/19 1021    Subjective  Had MRI of back - no change in her back and no changes to medication.  Had COVID vaccine - had a bad headache yesterday.  Thinks she is having the "Crystal" problem again on L side.    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  Yes  Vestibular Assessment - 07/23/19 1023      Positional Testing   Dix-Hallpike  --    Horizontal Canal Testing  Horizontal Canal Left      Horizontal Canal Right   Horizontal Canal Right Duration  10 seconds, more symptomatic to the L side    Horizontal Canal Right Symptoms  Geotrophic                Vestibular Treatment/Exercise - 07/23/19 1030      Vestibular Treatment/Exercise   Vestibular Treatment Provided  Canalith Repositioning    Canalith Repositioning  Canal Roll Left      Canal Roll Left   Number of Reps   3    Overall Response   Improved Symptoms    Response Details   Also demonstrated a combination of geotropic and upbeating, L rotary nystagmus indicating possible  multi-canal involvement.  Only focused on horizontal canal treatment today.         Balance Exercises - 07/23/19 1102      Balance Exercises: Standing   Standing Eyes Closed  Wide (BOA);Head turns;Solid surface;Other reps (comment)   10 reps each supervision, no UE support   Wall Bumps  Hip    Wall Bumps-Hips  Eyes closed;Anterior/posterior;10 reps          PT Short Term Goals - 07/16/19 1110      PT SHORT TERM GOAL #1   Title  Patient will report independent ambulation at home with white cane and SPC with supervision only    Time  4    Period  Weeks    Status  Not Met    Target Date  07/07/19      PT SHORT TERM GOAL #2   Title  Patient will negotiate 8 stairs with railings & SPC with </= minA    Baseline  4 with one rail with SPC and min A    Time  4    Period  Weeks    Status  Partially Met    Target Date  07/07/19        PT Long Term Goals - 07/04/19 2022      PT LONG TERM GOAL #1   Title  Pt will demonstrate ability to perform final HEP with supervision. (all due 08/06/19)    Time  8    Period  Weeks    Status  On-going      PT LONG TERM GOAL #2   Title  Pt will demonstrate negative positional testing for multiple canals    Time  8    Period  Weeks    Status  On-going      PT LONG TERM GOAL #3   Title  Pt will report no spinning when performing rolling in bed and during supine <> sit    Time  8    Period  Weeks    Status  On-going      PT LONG TERM GOAL #4   Title  Pt will demonstrate ability to ambulate 200' outside over pavement and negotiate curb with white cane & SPC    Baseline  06/07/19: ambulated with [facility] & white cane with minA, negotiated 4 stairs with SPC and min-modA    Time  8    Period  Weeks    Status  Revised      PT LONG TERM GOAL #5   Title  Pt will decrease falls risk as indicated by increase in BERG by 4 points.  Baseline  06/07/19: 30/56    Time  8    Period  Weeks    Status  Revised            Plan - 07/23/19  1412    Clinical Impression Statement  Pt presents with return of L sided BPPC and appears to have multi-canal involvement on L side.  Treated horizontal canal with canal roll L x 3 reps.  Will continue to address next session.  Rest of session continued to focus on balance and balance reaction with return to use of wall bumps without UE support and standing with wide BOS without UE support while performing head turns and nods.  Will continue to address in order to progress towards LTG.    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, IBS, HTN, HLD, femoral artery thrombosis, diverticulosis, cataract, OA, R shoulder surgery, lumbar laminectomy, R cannulated hip surgery, and embolectomy    Examination-Activity Limitations  Bed Mobility;Locomotion Level;Stand;Transfers;Stairs    Examination-Participation Restrictions  Shop;Community Activity    Rehab Potential  Good    PT Frequency  1x / week    PT Duration  8 weeks    PT Treatment/Interventions  ADLs/Self Care Home Management;Canalith Repostioning;Gait training;Stair training;Functional mobility training;Therapeutic activities;Therapeutic exercise;Balance training;Neuromuscular re-education;Patient/family education;Vestibular    PT Next Visit Plan  PT IS COMPLETELY VISUALLY IMPAIRED - USES WHITE CANE. Check BP, HR and Sp02.  Continue to address L BPPV - horz and posterior. Balance training-weight shifting, rockerboard, ankle and hip strategy training with decreased UE support (loses balance when she lifts her hands up to fix her hair).  Gait and stair training with white cane and regular cane for stability.    Consulted and Agree with Plan of Care  Patient;Family member/caregiver    Family Member Consulted  Husband       Patient will benefit from skilled therapeutic  intervention in order to improve the following deficits and impairments:  Decreased balance, Difficulty walking, Dizziness  Visit Diagnosis: Dizziness and giddiness  Repeated falls  Unsteadiness on feet  Difficulty in walking, not elsewhere classified  BPPV (benign paroxysmal positional vertigo), left     Problem List Patient Active Problem List   Diagnosis Date Noted  . Vertigo of central origin 02/09/2019  . Other peripheral vertigo, unspecified ear 02/09/2019  . Nonintractable headache 02/09/2019  . Pruritus 01/02/2019  . Hypokalemia 08/09/2018  . Long term (current) use of anticoagulants 08/01/2018  . Pre-operative clearance 08/01/2018  . Closed compression fracture of L4 vertebra ([place]) 07/11/2018  . Bilateral leg edema 07/11/2018  . Lung nodule < 6cm on CT 07/11/2018  . Hypothyroidism (acquired) 04/11/2018  . Fracture of femoral neck, right ([place]) 03/22/2018  . PAF (paroxysmal atrial fibrillation) ([place]) 03/18/2018  . 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 Herpes zoster without complication 12/75/1700  . Jaw claudication 07/27/2017  . Temporal arteritis ([place]) 07/18/2017  . Vision, loss, sudden, bilateral 07/18/2017  . 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 Osteoarthritis 06/25/2016  . Anxiety 06/25/2016  . Back pain 07/18/2015  . 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 Carotid bruit present 06/05/2010  . Osteoporosis 06/03/2008  . History of colonic polyps 06/03/2008  . INTENTION TREMOR 12/06/2007  . IBS 10/16/2007  . Hyperlipidemia 06/06/2007  . Essential hypertension 06/06/2007  . Chronic obstructive airway disease with asthma ([place]) 02/23/2007   Tilda Burrow  Melrose Nakayama, PT, DPT 07/23/19    2:19 PM    [facility] [address] [place], [place], [address] Phone: [phone]   Fax:  [phone]  Name: CHERYEL KYTE MRN: [id number] Date of Birth: Mar 27, 1946

## 2019-07-24 ENCOUNTER — Ambulatory Visit: Payer: Medicare Other | Admitting: Cardiology

## 2019-07-24 ENCOUNTER — Encounter: Payer: Self-pay | Admitting: Cardiology

## 2019-07-24 VITALS — BP 146/64 | HR 90 | Ht 64.0 in | Wt 131.0 lb

## 2019-07-24 DIAGNOSIS — I10 Essential (primary) hypertension: Secondary | ICD-10-CM | POA: Diagnosis not present

## 2019-07-24 DIAGNOSIS — I480 Paroxysmal atrial fibrillation: Secondary | ICD-10-CM

## 2019-07-24 DIAGNOSIS — E7800 Pure hypercholesterolemia, unspecified: Secondary | ICD-10-CM

## 2019-07-24 MED ORDER — AMIODARONE HCL 200 MG PO TABS: 200.0000 mg | ORAL_TABLET | Freq: Every day | ORAL | 3 refills | Status: DC

## 2019-07-24 NOTE — Patient Instructions (Signed)
Medication Instructions:  DECREASE AMIODARONE TO 200 MG ONCE DAILY  *If you need a refill on your cardiac medications before your next appointment, please call your pharmacy*  Lab Work: If you have labs (blood work) drawn today and your tests are completely normal, you will receive your results only by: Marland Kitchen MyChart Message (if you have MyChart) OR . A paper copy in the mail If you have any lab test that is abnormal or we need to change your treatment, we will call you to review the results.  Follow-Up: At [facility], you and your health needs are our priority.  As part of our continuing mission to provide you with exceptional heart care, we have created designated Provider Care Teams.  These Care Teams include your primary Cardiologist (physician) and Advanced Practice Providers (APPs -  Physician Assistants and Nurse Practitioners) who all work together to provide you with the care you need, when you need it.  Your next appointment:   3 month(s)  The format for your next appointment:   In Person  Provider:   [name], MD

## 2019-07-30 ENCOUNTER — Encounter: Payer: Self-pay | Admitting: Physical Therapy

## 2019-07-30 ENCOUNTER — Other Ambulatory Visit: Payer: Self-pay

## 2019-07-30 ENCOUNTER — Ambulatory Visit: Payer: Medicare Other | Admitting: Physical Therapy

## 2019-07-30 DIAGNOSIS — R42 Dizziness and giddiness: Secondary | ICD-10-CM | POA: Diagnosis not present

## 2019-07-30 DIAGNOSIS — H8111 Benign paroxysmal vertigo, right ear: Secondary | ICD-10-CM | POA: Diagnosis not present

## 2019-07-30 DIAGNOSIS — R2681 Unsteadiness on feet: Secondary | ICD-10-CM | POA: Diagnosis not present

## 2019-07-30 DIAGNOSIS — R262 Difficulty in walking, not elsewhere classified: Secondary | ICD-10-CM | POA: Diagnosis not present

## 2019-07-30 DIAGNOSIS — R296 Repeated falls: Secondary | ICD-10-CM | POA: Diagnosis not present

## 2019-07-30 DIAGNOSIS — H8112 Benign paroxysmal vertigo, left ear: Secondary | ICD-10-CM | POA: Diagnosis not present

## 2019-07-30 NOTE — Therapy (Signed)
[facility] [address] [place], [place], [address] Phone: [phone]   Fax:  [phone]  Physical Therapy Treatment  Patient Details  Name: Crystal [name] MRN: [id number] Date of Birth: 28-Jan-1946 Referring Provider (PT): [name], MD   Encounter Date: 07/30/2019  PT End of Session - 07/30/19 2022    Visit Number  18    Number of Visits  21    Date for PT Re-Evaluation  08/06/19    Authorization Type  [employer] Medicare; $40 copay.  10th visit PN.  No VL    PT Start Time  1015    PT Stop Time  1102    PT Time Calculation (min)  47 min    Activity Tolerance  Patient tolerated treatment well    Behavior During Therapy  WFL for tasks assessed/performed       Past Medical History:  Diagnosis Date  . Adenomatous colon polyp 2007 & 2010    Dr [name]  . Allergy    seasonal  . Arthritis   . Cataract   . Diverticulosis   . 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 Hyperlipidemia   . Hypertension   . IBS (irritable bowel syndrome)   . Legally blind   . MVP (mitral valve prolapse)   . Paroxysmal atrial fibrillation with rapid ventricular response ([place]) 08/29/2017  . Peripheral vascular disease ([place])   . RAD (reactive airway disease)     Past Surgical History:  Procedure Laterality Date  . ABDOMINAL HYSTERECTOMY     with USO for dysfunctionall menses  . APPENDECTOMY    . ARTERY BIOPSY Bilateral 07/21/2017   Procedure: BIOPSY BILATERAL TEMPORAL ARTERY;  Surgeon: [name], MD;  Location: [facility];  Service: Vascular;  Laterality: Bilateral;  . CARDIOVERSION N/A 06/16/2018   Procedure: CARDIOVERSION;  Surgeon: [name], MD;  Location: [facility];  Service: Cardiovascular;  Laterality: N/A;  . CATARACT EXTRACTION W/ INTRAOCULAR LENS IMPLANT  2013   bilateral; Dr [name]  . COLONOSCOPY  2013   negative , due 2018; Dr [name]  . COLONOSCOPY W/ POLYPECTOMY      X2 ; diverticulosis  .  EMBOLECTOMY Right 08/29/2017   Procedure: Right Groin Exploration  ;  Surgeon: Rosetta Posner, MD;  Location: [facility];  Service: Vascular;  Laterality: Right;  . HIP PINNING,CANNULATED Right 03/21/2018  . HIP PINNING,CANNULATED Right 03/21/2018   Procedure: CANNULATED HIP PINNING;  Surgeon: Rod Can, MD;  Location: [facility];  Service: Orthopedics;  Laterality: Right;  . LUMBAR LAMINECTOMY  2000   Dr Vertell Limber  . SHOULDER SURGERY     R shoulder  . TONSILLECTOMY      There were no vitals filed for this visit.  Subjective Assessment - 07/30/19 1019    Subjective  Appointment with cardiology went well.  Staying on amiodarone.  Dizziness with rolling is better but still getting dizzy coming up to sit.  Back is feeling better today.    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  Second assessment pt presented with downbeating only of short duration.  Third assessment pt presented with mild, brief upbeating, L rotary    Dix-Hallpike Left Symptoms  Upbeat, left rotatory nystagmus;Downbeat Nystagmus;Left nystagmus                Vestibular Treatment/Exercise - 07/30/19 1042      Vestibular Treatment/Exercise   Vestibular Treatment Provided  Canalith Repositioning    Canalith Repositioning  Epley Manuever  Left;Comment   Deep head hang      EPLEY MANUEVER LEFT   Number of Reps   2    Overall Response   Symptoms Resolved      OTHER   Comment  one rep deep head hang to address long arm of posterior canal with improvement in symptoms         Balance Exercises - 07/30/19 1043      Balance Exercises: Standing   Wall Bumps  Hip;Eyes closed;10 reps    Wall Bumps-Hips  Eyes closed;Anterior/posterior;Right/left (lateral);10 reps;Other (comment);Foam/compliant surface   2 sets with min A, standing on pillow       PT Education - 07/30/19 2022    Education Details  progressed wall bumps to compliant surface    Person(s) Educated  Patient    Methods  Explanation    Comprehension  Verbalized understanding       PT Short Term Goals - 07/16/19 1110      PT SHORT TERM GOAL #1   Title  Patient will report independent ambulation at home with white cane and SPC with supervision only    Time  4    Period  Weeks    Status  Not Met    Target Date  07/07/19      PT SHORT TERM GOAL #2   Title  Patient will negotiate 8 stairs with railings & SPC with </= minA    Baseline  4 with one rail with SPC and min A    Time  4    Period  Weeks    Status  Partially Met    Target Date  07/07/19        PT Long Term Goals - 07/04/19 2022      PT LONG TERM GOAL #1   Title  Pt will demonstrate ability to perform final HEP with supervision. (all due 08/06/19)    Time  8    Period  Weeks    Status  On-going      PT LONG TERM GOAL #2   Title  Pt will demonstrate negative positional testing for multiple canals    Time  8    Period  Weeks    Status  On-going      PT LONG TERM GOAL #3   Title  Pt will report no spinning when performing rolling in bed and during supine <> sit    Time  8    Period  Weeks    Status  On-going      PT LONG TERM GOAL #4   Title  Pt will demonstrate ability to ambulate 200' outside over pavement and negotiate curb with white cane & SPC    Baseline  06/07/19:  ambulated with [facility] & white cane with minA, negotiated 4 stairs with SPC and min-modA    Time  8    Period  Weeks    Status  Revised      PT LONG TERM GOAL #5   Title  Pt will decrease falls risk as indicated by increase in BERG by 4 points.    Baseline  06/07/19: 30/56    Time  8    Period  Weeks    Status  Revised            Plan - 07/30/19 2023    Clinical Impression Statement  Pt demonstrates resolution of L horizontal canal BPPV but continues to present with a mixture of upbeating, L rotary and downbeatin nystagmus of short duration with positional testing.  Pt treated for L posterior canal BPPV (long and short arm) with improvement in symptoms.  Continued to perform wall bumps for hip strategy training but progressed to compliant surface.  Following treatment pt reported feeling more balanced.  Will continue to address in order to progress towards LTG.    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, IBS, HTN, HLD, femoral artery thrombosis, diverticulosis, cataract, OA, R shoulder surgery, lumbar laminectomy, R cannulated hip surgery, and embolectomy    Examination-Activity Limitations  Bed Mobility;Locomotion Level;Stand;Transfers;Stairs    Examination-Participation Restrictions  Shop;Community Activity    Rehab Potential  Good    PT Frequency  1x / week    PT Duration  8 weeks    PT Treatment/Interventions  ADLs/Self Care Home Management;Canalith Repostioning;Gait training;Stair training;Functional mobility training;Therapeutic activities;Therapeutic exercise;Balance training;Neuromuscular re-education;Patient/family education;Vestibular    PT Next Visit Plan  PT IS COMPLETELY VISUALLY IMPAIRED - USES WHITE CANE. Check LTG/recert. Balance training-weight shifting, rockerboard, ankle and hip strategy  training with decreased UE support (loses balance when she lifts her hands up to fix her hair).  Gait and stair training with white cane and regular cane for stability.    Consulted and Agree with Plan of Care  Patient;Family member/caregiver    Family Member Consulted  Husband       Patient will benefit from skilled therapeutic intervention in order to improve the following deficits and impairments:  Decreased balance, Difficulty walking, Dizziness  Visit Diagnosis: Dizziness and giddiness  Repeated falls  Unsteadiness on feet  Difficulty in walking, not elsewhere classified  BPPV (benign paroxysmal positional vertigo), left     Problem List Patient Active Problem List   Diagnosis Date Noted  . Vertigo of central origin 02/09/2019  . Other peripheral vertigo, unspecified ear 02/09/2019  . Nonintractable headache 02/09/2019  . Pruritus 01/02/2019  . Hypokalemia 08/09/2018  . Long term (current) use of anticoagulants 08/01/2018  . Pre-operative clearance 08/01/2018  . Closed compression fracture of L4 vertebra ([place]) 07/11/2018  . Bilateral leg edema 07/11/2018  . Lung nodule < 6cm on CT 07/11/2018  . Hypothyroidism (acquired) 04/11/2018  . Fracture of femoral neck, right ([facility]) 03/22/2018  . PAF (paroxysmal atrial fibrillation) ([place]) 03/18/2018  . 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 Herpes zoster without complication 49/17/9150  . Jaw claudication 07/27/2017  . Temporal arteritis ([place]) 07/18/2017  . Vision, loss, sudden, bilateral 07/18/2017  . 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 Osteoarthritis 06/25/2016  . Anxiety 06/25/2016  . Back pain 07/18/2015  . 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 Carotid  bruit present 06/05/2010  . Osteoporosis 06/03/2008  . History of colonic polyps 06/03/2008  . INTENTION TREMOR 12/06/2007  . IBS 10/16/2007  . Hyperlipidemia 06/06/2007  . Essential hypertension 06/06/2007  . Chronic obstructive airway disease with asthma ([place])  02/23/2007    Rico Junker, PT, DPT 07/30/19    8:43 PM    [facility] [address] [place], [place], [address] Phone: [phone]   Fax:  [phone]  Name: ARTESIA BERKEY MRN: [id number] Date of Birth: 15-Nov-1945

## 2019-08-03 ENCOUNTER — Other Ambulatory Visit: Payer: Self-pay | Admitting: Internal Medicine

## 2019-08-06 ENCOUNTER — Ambulatory Visit: Payer: Medicare Other | Admitting: Physical Therapy

## 2019-08-06 ENCOUNTER — Encounter: Payer: Self-pay | Admitting: Physical Therapy

## 2019-08-06 ENCOUNTER — Other Ambulatory Visit: Payer: Self-pay

## 2019-08-06 DIAGNOSIS — R42 Dizziness and giddiness: Secondary | ICD-10-CM

## 2019-08-06 DIAGNOSIS — H8111 Benign paroxysmal vertigo, right ear: Secondary | ICD-10-CM

## 2019-08-06 DIAGNOSIS — R262 Difficulty in walking, not elsewhere classified: Secondary | ICD-10-CM | POA: Diagnosis not present

## 2019-08-06 DIAGNOSIS — R296 Repeated falls: Secondary | ICD-10-CM | POA: Diagnosis not present

## 2019-08-06 DIAGNOSIS — H8112 Benign paroxysmal vertigo, left ear: Secondary | ICD-10-CM | POA: Diagnosis not present

## 2019-08-06 DIAGNOSIS — R2681 Unsteadiness on feet: Secondary | ICD-10-CM | POA: Diagnosis not present

## 2019-08-07 ENCOUNTER — Ambulatory Visit (INDEPENDENT_AMBULATORY_CARE_PROVIDER_SITE_OTHER)
Admission: RE | Admit: 2019-08-07 | Discharge: 2019-08-07 | Disposition: A | Discharge status: Home / Self Care | Payer: Medicare Other | Source: Ambulatory Visit | Attending: Internal Medicine | Admitting: Internal Medicine

## 2019-08-07 ENCOUNTER — Other Ambulatory Visit: Payer: Self-pay | Admitting: Internal Medicine

## 2019-08-07 DIAGNOSIS — R911 Solitary pulmonary nodule: Secondary | ICD-10-CM | POA: Diagnosis not present

## 2019-08-07 DIAGNOSIS — J432 Centrilobular emphysema: Secondary | ICD-10-CM | POA: Diagnosis not present

## 2019-08-07 DIAGNOSIS — R918 Other nonspecific abnormal finding of lung field: Secondary | ICD-10-CM

## 2019-08-07 DIAGNOSIS — IMO0001 Reserved for inherently not codable concepts without codable children: Secondary | ICD-10-CM

## 2019-08-07 NOTE — Therapy (Signed)
[facility] [address] [place], [place], [address] Phone: [phone]   Fax:  [phone]  Physical Therapy Treatment and 10th visit Progress Note  Patient Details  Name: Crystal [name] MRN: [id number] Date of Birth: [birth year] Referring Provider (PT): Crystal Rail, MD   Encounter Date: 08/06/2019   Progress Note Reporting Period 05/18/2019 to 08/06/2019  See note below for Objective Data and Assessment of Progress/Goals.    PT End of Session - 08/07/19 1617    Visit Number  19   10th visit progress note performed at visit 19   Number of Visits  27    Date for PT Re-Evaluation  10/06/19    Authorization Type  [employer] Medicare; $40 copay.  10th visit PN.  No VL    Progress Note Due on Visit  29    PT Start Time  1020    PT Stop Time  1110    PT Time Calculation (min)  50 min    Activity Tolerance  Other (comment)   anxiety   Behavior During Therapy  WFL for tasks assessed/performed       Past Medical History:  Diagnosis Date  . Adenomatous colon polyp 2007 & 2010    Dr [name]  . Allergy    seasonal  . Arthritis   . Cataract   . Diverticulosis   . 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 Hyperlipidemia   . Hypertension   . IBS (irritable bowel syndrome)   . Legally blind   . MVP (mitral valve prolapse)   . Paroxysmal atrial fibrillation with rapid ventricular response ([facility]) 08/29/2017  . Peripheral vascular disease ([facility])   . RAD (reactive airway disease)     Past Surgical History:  Procedure Laterality Date  . ABDOMINAL HYSTERECTOMY     with USO for dysfunctionall menses  . APPENDECTOMY    . ARTERY BIOPSY Bilateral 07/21/2017   Procedure: BIOPSY BILATERAL TEMPORAL ARTERY;  Surgeon: [name], MD;  Location: [facility];  Service: Vascular;  Laterality: Bilateral;  . CARDIOVERSION N/A 06/16/2018   Procedure: CARDIOVERSION;  Surgeon: [name], MD;  Location: [facility];   Service: Cardiovascular;  Laterality: N/A;  . CATARACT EXTRACTION W/ INTRAOCULAR LENS IMPLANT  2013   bilateral; Dr [name]  . COLONOSCOPY  2013   negative , due 2018; Dr [name]  . COLONOSCOPY W/ POLYPECTOMY      X2 ; diverticulosis  . EMBOLECTOMY Right 08/29/2017   Procedure: Right Groin Exploration  ;  Surgeon: Rosetta Posner, MD;  Location: [facility];  Service: Vascular;  Laterality: Right;  . HIP PINNING,CANNULATED Right 03/21/2018  . HIP PINNING,CANNULATED Right 03/21/2018   Procedure: CANNULATED HIP PINNING;  Surgeon: Rod Can, MD;  Location: [facility];  Service: Orthopedics;  Laterality: Right;  . LUMBAR LAMINECTOMY  2000   Dr Vertell Limber  . SHOULDER SURGERY     R shoulder  . TONSILLECTOMY      There were no vitals filed for this visit.  Subjective Assessment - 08/06/19 1032    Subjective  Had a bad  headache on Friday and didn't feel well.  No headache today.  Still getting a little bit of dizziness when lying down and getting back up.    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, IBS, HTN, HLD, femoral artery thrombosis, diverticulosis, cataract, OA, R shoulder surgery, lumbar laminectomy,  R cannulated hip surgery, and embolectomy      Prior Function   Level of Independence  Independent with household mobility with device;Independent with basic ADLs;Requires assistive device for independence;Independent with transfers      Observation/Other Assessments   Focus on Therapeutic Outcomes (FOTO)   N/A      Ambulation/Gait   Ambulation/Gait Assistance Details  use of cane and white cane with min A of therapist providing verbal cues for direction changes, to prevent pt from veering too far to R, cues to use white cane to scan environment and use environment to guide direction of ambulation (don't ambulate toward voices, keep white cane near wall and use to prevent veering to R); intermittent LOB x 3-4 reps resulting in pt becoming extremely anxious, lifting canes and grabbing therapist.  Required therapist to provide total A to regain balance and reground patient prior to continuing across gym    Ambulation Distance (Feet)  150 Feet    Gait Pattern  Step-to pattern;Decreased step length - right;Decreased step length - left;Decreased stride length;Shuffle;Poor foot clearance - left;Poor foot clearance - right    Curb  2: Max assist    Curb Details (indicate cue type and reason)  mod-max A with quad tip cane and white cane.  Required total verbal cues to sequence stepping up and down curb; pt required max for placement of cane, weight shifting and advancing forwards on to or off of curb - pt again became very anxious and lifted canes and grabbed therapist - required therapist's assistance to regain balance.        Berg Balance Test   Sit to Stand  Able to stand  independently using hands    Standing Unsupported  Able to stand 2 minutes with supervision    Sitting with Back Unsupported but Feet Supported on Floor or Stool  Able to sit safely and securely 2 minutes    Stand to Sit  Controls descent by using hands    Transfers  Needs one person to assist    Standing Unsupported with Eyes  Closed  Able to stand 10 seconds with supervision    Standing Unsupported with Feet Together  Needs help to attain position but able to stand for 30 seconds with feet together    From Standing, Reach Forward with Outstretched Arm  Can reach forward >12 cm safely (5")    From Standing Position, Pick up Object from Floor  Able to pick up shoe, needs supervision    From Standing Position, Turn to Look Behind Over each Shoulder  Needs supervision when turning    Turn 360 Degrees  Needs assistance while turning  Standing Unsupported, Alternately Place Feet on Step/Stool  Able to complete >2 steps/needs minimal assist    Standing Unsupported, One Foot in Front  Able to take small step independently and hold 30 seconds    Standing on One Leg  Unable to try or needs assist to prevent fall    Total Score  28    [facility] comment[facility] Adult PT Treatment/Exercise - 08/06/19 1033      Ambulation/Gait   Ambulation/Gait  Yes    Ambulation/Gait Assistance  4: Min assist;2: Max assist    Assistive device  Straight cane;Other (Comment)    Ambulation Surface  Level;Indoor             PT Education - 08/07/19 1616    Education Details  progress towards goals, curb negotiation, areas to continue to address    Person(s) Educated  Patient;Spouse    Methods  Explanation    Comprehension  Verbalized understanding       PT Short Term Goals - 07/16/19 1110      PT SHORT TERM GOAL #1   Title  Patient will report independent ambulation at home with white cane and SPC with supervision only    Time  4    Period  Weeks    Status  Not Met    Target Date  07/07/19      PT SHORT TERM GOAL #2   Title  Patient will negotiate 8 stairs with railings & SPC with </= minA    Baseline  4 with one [name] with SPC and min A    Time  4    Period  Weeks    Status  Partially Met    Target Date  07/07/19        PT Long Term Goals - 08/07/19 1618      PT LONG TERM GOAL #1    Title  Pt will demonstrate ability to perform final HEP with supervision. (all due 08/06/19)    Baseline  does part of HEP with husband supervision, other part of HEP with daughter in law    Time  8    Period  Weeks    Status  Partially Met      PT LONG TERM GOAL #2   Title  Pt will demonstrate negative positional testing for multiple canals    Time  8    Period  Weeks    Status  Achieved      PT LONG TERM GOAL #3   Title  Pt will report no spinning when performing rolling in bed and during supine <> sit    Baseline  mild dizziness with supine <> sit, no vertigo with rolling    Time  8    Period  Weeks    Status  Partially Met      PT LONG TERM GOAL #4   Title  Pt will demonstrate ability to ambulate 200' outside over pavement and negotiate curb with white cane & SPC    Baseline  min-mod A indoors with cane/white cane 150'; curb with mod-max A    Time  8    Period  Weeks    Status  Not Met      PT LONG TERM GOAL #5   Title  Pt will decrease falls risk as indicated by increase in BERG by 4 points.  Baseline  slight decrease to 28/56    Time  8    Period  Weeks    Status  Not Met       New goals for recertification: PT Short Term Goals - 08/07/19 1635      PT SHORT TERM GOAL #1   Title  Pt will demonstrate ability to perform ongoing HEP with supervision    Time  4    Period  Weeks    Status  Revised    Target Date  09/06/19      PT SHORT TERM GOAL #2   Title  Pt will ambulate x 230' over indoor surfaces with LRAD and min A with no significant LOB    Time  4    Period  Weeks    Status  Revised    Target Date  09/06/19      PT SHORT TERM GOAL #3   Title  Pt will increase BERG score by 4 points    Baseline  28/56    Time  4    Period  Weeks    Status  New    Target Date  09/06/19      PT SHORT TERM GOAL #4   Title  Pt will demonstrate ability to stand for 2 minutes without use of UE for support but performing dynamic activity with UE with supervision to  return to fixing hair in standing    Time  4    Period  Weeks    Status  New    Target Date  09/06/19      PT Long Term Goals - 08/07/19 1638      PT LONG TERM GOAL #1   Title  Pt will demonstrate ability to perform final HEP with supervision    Baseline  does part of HEP with husband supervision, other part of HEP with daughter in law    Time  8    Period  Weeks    Status  Revised    Target Date  10/06/19      PT LONG TERM GOAL #2   Title  Pt will demonstrate negative positional testing for multiple canals    Time  8    Period  Weeks    Status  Revised    Target Date  10/06/19      PT LONG TERM GOAL #3   Title  Pt will demonstrate ability to perform sit <> stand and stand pivot transfers to L and R with LRAD and supervision with no LOB    Baseline  Min HHA with white cane    Time  8    Period  Weeks    Status  Revised    Target Date  10/06/19      PT LONG TERM GOAL #4   Title  Pt will demonstrate ability to ambulate 200' outside over pavement and negotiate curb with LRAD and min A    Baseline  min-mod A indoors with cane/white cane 150'; curb with mod-max A    Time  8    Period  Weeks    Status  Revised    Target Date  10/06/19      PT LONG TERM GOAL #5   Title  Pt will decrease falls risk as indicated by increase in BERG to >/= 34/56    Baseline  slight decrease to 28/56    Time  8    Period  Weeks    Status  Revised  Target Date  10/06/19           Plan - 08/07/19 1623    Clinical Impression Statement  Pt is making slow progress and has met 1/5 LTG, partially met 2 goals and did not meet 2 goals.  Pt currently demonstrates resolution of BPPV but has intermittent reoccurence of R or L BPPV that requires CRM due to pt at very high falls risk.  Pt continues to demonstrate significant difficulty with static standing without UE support and decreased use of ankle and hip strategies for balance and NO evidence of step strategy.  Pt also continues to perform gait  training with therapy with cane and white cane but is not able to carryover with husband at home due to safety concerns.  Pt will benefit from ongoing skilled PT services to continue to address vestibular, balance, postural righting, LE strength and gait impairments to maximize functional mobility independence and decrease falls risk.  Pt may require gait training with more supportive AD.    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, IBS, HTN, HLD, femoral artery thrombosis, diverticulosis, cataract, OA, R shoulder surgery, lumbar laminectomy, R cannulated hip surgery, and embolectomy    Examination-Activity Limitations  Bed Mobility;Locomotion Level;Stand;Transfers;Stairs    Examination-Participation Restrictions  Shop;Community Activity    Rehab Potential  Good    PT Frequency  1x / week    PT Duration  8 weeks    PT Treatment/Interventions  ADLs/Self Care Home Management;Canalith Repostioning;Gait training;Stair training;Functional mobility training;Therapeutic activities;Therapeutic exercise;Balance training;Neuromuscular re-education;Patient/family education;Vestibular    PT Next Visit Plan  PT IS COMPLETELY VISUALLY IMPAIRED - USES WHITE CANE.  Start bringing cane to waiting area and walking back with it.  Trial RW/rollator.  Seated on rockerboard - head/trunk righting, head tilts.  Step ups in // bars, sit <> stand with decreased UE support, corner balance with decreased UE support - dynamic UE movement to simulate fixing hair, wall bumps.    Consulted and Agree with Plan of Care  Patient;Family member/caregiver    Family Member Consulted  Husband       Patient will benefit from skilled therapeutic intervention in order to improve the following deficits and impairments:  Decreased balance, Difficulty walking,  Dizziness  Visit Diagnosis: Dizziness and giddiness  Repeated falls  Unsteadiness on feet  Difficulty in walking, not elsewhere classified  BPPV (benign paroxysmal positional vertigo), left  BPPV (benign paroxysmal positional vertigo), right     Problem List Patient Active Problem List   Diagnosis Date Noted  . Vertigo of central origin 02/09/2019  . Other peripheral vertigo, unspecified ear 02/09/2019  . Nonintractable headache 02/09/2019  . Pruritus 01/02/2019  . Hypokalemia 08/09/2018  . Long term (current) use of anticoagulants 08/01/2018  . Pre-operative clearance 08/01/2018  . Closed compression fracture of L4 vertebra ([place]) 07/11/2018  . Bilateral leg edema 07/11/2018  . Lung nodule < 6cm on CT 07/11/2018  . Hypothyroidism (acquired) 04/11/2018  . Fracture of femoral neck, right ([place]) 03/22/2018  . PAF (paroxysmal atrial fibrillation) ([place]) 03/18/2018  . Femoral artery thrombosis, right ([place]) 09/01/2017  . Herpes zoster without complication 14/43/1540  . Jaw claudication 07/27/2017  . Temporal arteritis ([place]) 07/18/2017  . Vision, loss, sudden, bilateral 07/18/2017  . 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 Osteoarthritis 06/25/2016  . Anxiety 06/25/2016  . Back pain 07/18/2015  .  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 Carotid bruit present 06/05/2010  . Osteoporosis 06/03/2008  . History of colonic polyps 06/03/2008  . INTENTION TREMOR 12/06/2007  . IBS 10/16/2007  . Hyperlipidemia 06/06/2007  . Essential hypertension 06/06/2007  . Chronic obstructive airway disease with asthma ([facility]) 02/23/2007    Rico Junker, PT, DPT 08/07/19    4:34 PM    [facility] [address] [place], [place], [address] Phone: [phone]   Fax:  [phone]  Name: Crystal [name] MRN: [id number] Date of Birth: 1945-12-22

## 2019-08-13 ENCOUNTER — Other Ambulatory Visit: Payer: Self-pay | Admitting: Internal Medicine

## 2019-08-13 DIAGNOSIS — J45909 Unspecified asthma, uncomplicated: Secondary | ICD-10-CM

## 2019-08-13 DIAGNOSIS — J84112 Idiopathic pulmonary fibrosis: Secondary | ICD-10-CM

## 2019-08-15 ENCOUNTER — Ambulatory Visit: Payer: Medicare Other | Attending: Internal Medicine

## 2019-08-15 DIAGNOSIS — Z23 Encounter for immunization: Secondary | ICD-10-CM | POA: Insufficient documentation

## 2019-08-15 NOTE — Progress Notes (Signed)
   Covid-19 Vaccination Clinic  Name:  Crystal [name]    MRN: [id number] DOB: 17-Jan-1946  08/15/2019  Crystal [name] was observed post Covid-19 immunization for 15 minutes without incident. She was provided with Vaccine Information Sheet and instruction to access the V-Safe system.   Crystal [name] was instructed to call 911 with any severe reactions post vaccine: [place] Kitchen Difficulty breathing  . Swelling of face and throat  . A fast heartbeat  . A bad rash all over body  . Dizziness and weakness   Immunizations Administered    Name Date Dose VIS Date Route   Pfizer COVID-19 Vaccine 08/15/2019 10:42 AM 0.3 mL 05/25/2019 Intramuscular   Manufacturer: [place]   Lot: [id number]   [place]: [id number]

## 2019-08-17 ENCOUNTER — Other Ambulatory Visit: Payer: Self-pay | Admitting: Internal Medicine

## 2019-08-17 ENCOUNTER — Ambulatory Visit: Payer: Medicare Other | Admitting: Physical Therapy

## 2019-08-20 ENCOUNTER — Other Ambulatory Visit: Payer: Self-pay

## 2019-08-20 ENCOUNTER — Encounter: Payer: Self-pay | Admitting: Physical Therapy

## 2019-08-20 ENCOUNTER — Ambulatory Visit: Payer: Medicare Other | Attending: Internal Medicine | Admitting: Physical Therapy

## 2019-08-20 DIAGNOSIS — R262 Difficulty in walking, not elsewhere classified: Secondary | ICD-10-CM | POA: Insufficient documentation

## 2019-08-20 DIAGNOSIS — R296 Repeated falls: Secondary | ICD-10-CM | POA: Diagnosis not present

## 2019-08-20 DIAGNOSIS — R2681 Unsteadiness on feet: Secondary | ICD-10-CM | POA: Diagnosis not present

## 2019-08-20 DIAGNOSIS — R42 Dizziness and giddiness: Secondary | ICD-10-CM | POA: Insufficient documentation

## 2019-08-20 NOTE — Therapy (Signed)
[facility] [address] [place], [place], [address] Phone: [phone]   Fax:  [phone]  Physical Therapy Treatment  Patient Details  Name: Crystal [name] MRN: [id number] Date of Birth: January 03, 1946 Referring Provider (PT): [name], MD   Encounter Date: 08/20/2019  PT End of Session - 08/20/19 1744    Visit Number  20    Number of Visits  27    Date for PT Re-Evaluation  10/06/19    Authorization Type  [employer] Medicare; $40 copay.  10th visit PN.  No VL    Progress Note Due on Visit  29    PT Start Time  1016    PT Stop Time  1102    PT Time Calculation (min)  46 min    Activity Tolerance  Patient tolerated treatment well    Behavior During Therapy  WFL for tasks assessed/performed       Past Medical History:  Diagnosis Date  . Adenomatous colon polyp 2007 & 2010    Dr [name]  . Allergy    seasonal  . Arthritis   . Cataract   . Diverticulosis   . 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 Hyperlipidemia   . Hypertension   . IBS (irritable bowel syndrome)   . Legally blind   . MVP (mitral valve prolapse)   . Paroxysmal atrial fibrillation with rapid ventricular response ([place] [place]) 08/29/2017  . Peripheral vascular disease ([facility])   . RAD (reactive airway disease)     Past Surgical History:  Procedure Laterality Date  . ABDOMINAL HYSTERECTOMY     with USO for dysfunctionall menses  . APPENDECTOMY    . ARTERY BIOPSY Bilateral 07/21/2017   Procedure: BIOPSY BILATERAL TEMPORAL ARTERY;  Surgeon: [name], MD;  Location: [facility];  Service: Vascular;  Laterality: Bilateral;  . CARDIOVERSION N/A 06/16/2018   Procedure: CARDIOVERSION;  Surgeon: [name], MD;  Location: [facility];  Service: Cardiovascular;  Laterality: N/A;  . CATARACT EXTRACTION W/ INTRAOCULAR LENS IMPLANT  2013   bilateral; Dr [name]  . COLONOSCOPY  2013   negative , due 2018; Dr [name]  . COLONOSCOPY W/ POLYPECTOMY       X2 ; diverticulosis  . EMBOLECTOMY Right 08/29/2017   Procedure: Right Groin Exploration  ;  Surgeon: Rosetta Posner, MD;  Location: [facility];  Service: Vascular;  Laterality: Right;  . HIP PINNING,CANNULATED Right 03/21/2018  . HIP PINNING,CANNULATED Right 03/21/2018   Procedure: CANNULATED HIP PINNING;  Surgeon: Rod Can, MD;  Location: [facility];  Service: Orthopedics;  Laterality: Right;  . LUMBAR LAMINECTOMY  2000   Dr Vertell Limber  . SHOULDER SURGERY     R shoulder  . TONSILLECTOMY      There were no vitals filed for this visit.  Subjective Assessment - 08/20/19 1024    Subjective  Had difficulty breating this morning due to asthma and had to take inhaler.  Feeling a little shaky.  The vaccine gave her a bad headache.    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  No/denies  [facility] Adult PT Treatment/Exercise - 08/20/19 1027      Ambulation/Gait   Ambulation/Gait  Yes    Ambulation/Gait Assistance  4: Min assist;3: Mod assist    Ambulation/Gait Assistance Details  Before falling and had hip surgery pt was using cane, after fall with hip surgery pt has been using rolling walker at home.  Ambulated into treatment area with cane and white cane with min A with verbal cues to place cane in LUE forward with R foot for increased stability.  When taking longer stride with cane pt experienced only one LOB requiring mod A to recover - pt demonstrated slightly increased step length, stride length and gait velocity today with cane.  Ambulated with two wheeled walker around gym  x 1 with min A to steer RW and avoid obstacles with pt demonstrating significant improvement in gait velocity, balance, step and stride length bilaterally.  Also performed gait around gym with rollator to trial and determine if pt would be safe with rollator.  Pt again only required min A to steer around obstacles but continued to demonstrate upright posture, increased gait velocity, increased step and stride length with no LOB.    Ambulation Distance (Feet)  230 Feet    Assistive device  Rolling walker;4-wheeled walker;Straight cane;Other (Comment)   white cane   Gait Pattern  Step-to pattern;Step-through pattern;Decreased step length - right;Decreased step length - left;Decreased stride length    Ambulation Surface  Level;Indoor      Therapeutic Activites    Therapeutic Activities  Other Therapeutic Activities    Other Therapeutic Activities  Explained each part of rollator and how to lock, unlock brakes, safe sit <> stand with brakes locked, stabilizing rollator against wall or cabinet prior to sitting on seat to prevent sliding, raising seat and using basket for storage or to carry items, also demonstrated how easily rollator moves and turns      Neuro Re-ed    Neuro Re-ed Details   head and trunk righting training seated on rockerboard on mat performing lateral weight shifts with trunk shortening and elongation with head righting to midline x 10 reps with therapist guiding weight shift.  Also performed 10 with pt initiating weight shift with therapist facilitating head position and incorporating reaching to L and R.  Pt is able to perform at home with daughter in law on balance disc.             PT Education - 08/20/19 1744    Education Details  gait with rollator, awareness of head position and head righting    Person(s) Educated  Patient;Spouse    Methods  Explanation;Tactile cues    Comprehension  Verbalized understanding       PT Short Term Goals - 08/07/19 1635      PT SHORT  TERM GOAL #1   Title  Pt will demonstrate ability to perform ongoing HEP with supervision    Time  4    Period  Weeks    Status  Revised    Target Date  09/06/19      PT SHORT TERM GOAL #2   Title  Pt will ambulate x 230' over indoor surfaces with LRAD and min A with no significant LOB    Time  4    Period  Weeks    Status  Revised    Target Date  09/06/19      PT SHORT TERM GOAL #3   Title  Pt will increase BERG score by 4  points    Baseline  28/56    Time  4    Period  Weeks    Status  New    Target Date  09/06/19      PT SHORT TERM GOAL #4   Title  Pt will demonstrate ability to stand for 2 minutes without use of UE for support but performing dynamic activity with UE with supervision to return to fixing hair in standing    Time  4    Period  Weeks    Status  New    Target Date  09/06/19        PT Long Term Goals - 08/07/19 1638      PT LONG TERM GOAL #1   Title  Pt will demonstrate ability to perform final HEP with supervision    Baseline  does part of HEP with husband supervision, other part of HEP with daughter in law    Time  8    Period  Weeks    Status  Revised    Target Date  10/06/19      PT LONG TERM GOAL #2   Title  Pt will demonstrate negative positional testing for multiple canals    Time  8    Period  Weeks    Status  Revised    Target Date  10/06/19      PT LONG TERM GOAL #3   Title  Pt will demonstrate ability to perform sit <> stand and stand pivot transfers to L and R with LRAD and supervision with no LOB    Baseline  Min HHA with white cane    Time  8    Period  Weeks    Status  Revised    Target Date  10/06/19      PT LONG TERM GOAL #4   Title  Pt will demonstrate ability to ambulate 200' outside over pavement and negotiate curb with LRAD and min A    Baseline  min-mod A indoors with cane/white cane 150'; curb with mod-max A    Time  8    Period  Weeks    Status  Revised    Target Date  10/06/19      PT LONG TERM GOAL #5   Title   Pt will decrease falls risk as indicated by increase in BERG to >/= 34/56    Baseline  slight decrease to 28/56    Time  8    Period  Weeks    Status  Revised    Target Date  10/06/19            Plan - 08/20/19 2106    Clinical Impression Statement  Continued to perform gait training with cane and white cane - pt reports she has been practicing at home with family members but pt continues to demonstrate significant fear of falling, requires increased assistance and demonstrates slower gait velocity and decreasd step/stride length.  With rolling walker and rollator pt demonstrates faster gait velocities and improved gait sequencing with decreased assistance from therapist.  Will continue to train gait with RW and will continue to perform balance and vestibular training to decrease falls risk when standing without UE support.  Performed trunk and head righting training on tilt board.  Will continue to address in order to progress towards LTG.    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, IBS, HTN, HLD, femoral artery thrombosis, diverticulosis, cataract, OA, R shoulder surgery, lumbar laminectomy, R cannulated hip surgery, and embolectomy    Examination-Activity Limitations  Bed Mobility;Locomotion Level;Stand;Transfers;Stairs    Examination-Participation Restrictions  Shop;Community Activity    Rehab Potential  Good    PT Frequency  1x / week    PT Duration  8 weeks    PT Treatment/Interventions  ADLs/Self Care Home Management;Canalith Repostioning;Gait training;Stair training;Functional mobility training;Therapeutic activities;Therapeutic exercise;Balance training;Neuromuscular re-education;Patient/family education;Vestibular    PT Next Visit Plan  PT IS COMPLETELY VISUALLY IMPAIRED - USES WHITE CANE.  Gait  training with rollator - outside, inclines, curb.  (give picture of rollators they can purchase).  Seated on rockerboard - head/trunk righting, head tilts.  Step ups in // bars, sit <> stand with decreased UE support, corner balance with decreased UE support - dynamic UE movement to simulate fixing hair, wall bumps.    Consulted and Agree with Plan of Care  Patient;Family member/caregiver    Family Member Consulted  Husband       Patient will benefit from skilled therapeutic intervention in order to improve the following deficits and impairments:  Decreased balance, Difficulty walking, Dizziness  Visit Diagnosis: Dizziness and giddiness  Repeated falls  Unsteadiness on feet  Difficulty in walking, not elsewhere classified     Problem List Patient Active Problem List   Diagnosis Date Noted  . Vertigo of central origin 02/09/2019  . Other peripheral vertigo, unspecified ear 02/09/2019  . Nonintractable headache 02/09/2019  . Pruritus 01/02/2019  . Hypokalemia 08/09/2018  . Long term (current) use of anticoagulants 08/01/2018  . Pre-operative clearance 08/01/2018  . Closed compression fracture of L4 vertebra ([facility]) 07/11/2018  . Bilateral leg edema 07/11/2018  . Lung nodule < 6cm on CT 07/11/2018  . Hypothyroidism (acquired) 04/11/2018  . Fracture of femoral neck, right ([facility]) 03/22/2018  . PAF (paroxysmal atrial fibrillation) ([facility]) 03/18/2018  . 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 Herpes zoster without complication [id number]  . Jaw claudication 07/27/2017  . Temporal arteritis ([facility]) 07/18/2017  . Vision, loss, sudden, bilateral 07/18/2017  . 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 Osteoarthritis 06/25/2016  . Anxiety 06/25/2016  . Back pain 07/18/2015  . 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 Carotid bruit present 06/05/2010  . Osteoporosis 06/03/2008  . History of colonic polyps 06/03/2008  . INTENTION TREMOR 12/06/2007  . IBS 10/16/2007  . Hyperlipidemia 06/06/2007  .  Essential hypertension 06/06/2007  . Chronic obstructive airway disease with asthma ([place]) 02/23/2007    Rico Junker, PT, DPT 08/20/19    9:13 PM    [facility] [address] [place], [place], [address] Phone: [phone]   Fax:  [phone]  Name: Crystal [name] MRN: [id number] Date of Birth: Jul 01, 1945

## 2019-08-20 NOTE — Telephone Encounter (Signed)
Last OV 07/17/19 Next OV 01/14/20 Last RF 07/25/19

## 2019-08-21 ENCOUNTER — Other Ambulatory Visit: Payer: Self-pay | Admitting: Internal Medicine

## 2019-08-27 ENCOUNTER — Other Ambulatory Visit: Payer: Self-pay | Admitting: Internal Medicine

## 2019-08-27 MED ORDER — GABAPENTIN 100 MG PO CAPS: 500.0000 mg | ORAL_CAPSULE | Freq: Every day | ORAL | 0 refills | Status: DC

## 2019-08-27 NOTE — Telephone Encounter (Signed)
Ok to fill? You were not last prescriber.

## 2019-08-27 NOTE — Telephone Encounter (Signed)
    Spouse calling to request Dr [name] write prescription for gabapentin (NEURONTIN) 100 MG capsule.    1.Medication Requested:gabapentin (NEURONTIN) 100 MG capsule  2. Pharmacy (Name, Street, City):[facility] [place], [address]  3. On Med List: yes  4. Last Visit with PCP: 07/17/19  5. Next visit date with PCP:   Agent: Please be advised that RX refills may take up to 3 business days. We ask that you follow-up with your pharmacy.

## 2019-08-30 ENCOUNTER — Ambulatory Visit: Payer: Medicare Other | Admitting: Physical Therapy

## 2019-08-30 ENCOUNTER — Other Ambulatory Visit: Payer: Self-pay

## 2019-08-30 DIAGNOSIS — R262 Difficulty in walking, not elsewhere classified: Secondary | ICD-10-CM

## 2019-08-30 DIAGNOSIS — R296 Repeated falls: Secondary | ICD-10-CM

## 2019-08-30 DIAGNOSIS — R2681 Unsteadiness on feet: Secondary | ICD-10-CM

## 2019-08-30 DIAGNOSIS — R42 Dizziness and giddiness: Secondary | ICD-10-CM | POA: Diagnosis not present

## 2019-08-30 NOTE — Therapy (Signed)
[facility] [address] [place], [place], [address] Phone: [phone]   Fax:  [phone]  Physical Therapy Treatment  Patient Details  Name: Crystal [name] MRN: [id number] Date of Birth: 1945-12-18 Referring Provider (PT): [name], MD   Encounter Date: 08/30/2019  PT End of Session - 08/30/19 [id number]    Visit Number  21    Number of Visits  27    Date for PT Re-Evaluation  10/06/19    Authorization Type  BCBS Medicare; $40 copay.  10th visit PN.  No VL    Progress Note Due on Visit  29    PT Start Time  1017    PT Stop Time  1102    PT Time Calculation (min)  45 min    Equipment Utilized During Treatment  Gait belt;Other (comment)   rollator   Activity Tolerance  Patient tolerated treatment well    Behavior During Therapy  [facility] for tasks assessed/performed       Past Medical History:  Diagnosis Date  . Adenomatous colon polyp 2007 & 2010    Dr [name]  . Allergy    seasonal  . Arthritis   . Cataract   . Diverticulosis   . 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 Hyperlipidemia   . Hypertension   . IBS (irritable bowel syndrome)   . Legally blind   . MVP (mitral valve prolapse)   . Paroxysmal atrial fibrillation with rapid ventricular response ([place]) 08/29/2017  . Peripheral vascular disease ([place])   . RAD (reactive airway disease)     Past Surgical History:  Procedure Laterality Date  . ABDOMINAL HYSTERECTOMY     with USO for dysfunctionall menses  . APPENDECTOMY    . ARTERY BIOPSY Bilateral 07/21/2017   Procedure: BIOPSY BILATERAL TEMPORAL ARTERY;  Surgeon: [name], MD;  Location: [facility];  Service: Vascular;  Laterality: Bilateral;  . CARDIOVERSION N/A 06/16/2018   Procedure: CARDIOVERSION;  Surgeon: [name], MD;  Location: [facility];  Service: Cardiovascular;  Laterality: N/A;  . CATARACT EXTRACTION W/ INTRAOCULAR LENS IMPLANT  2013   bilateral; Dr [name]  .  COLONOSCOPY  2013   negative , due 2018; Dr [name]  . COLONOSCOPY W/ POLYPECTOMY      X2 ; diverticulosis  . EMBOLECTOMY Right 08/29/2017   Procedure: Right Groin Exploration  ;  Surgeon: Rosetta Posner, MD;  Location: [facility];  Service: Vascular;  Laterality: Right;  . HIP PINNING,CANNULATED Right 03/21/2018  . HIP PINNING,CANNULATED Right 03/21/2018   Procedure: CANNULATED HIP PINNING;  Surgeon: Rod Can, MD;  Location: [facility];  Service: Orthopedics;  Laterality: Right;  . LUMBAR LAMINECTOMY  2000   Dr Vertell Limber  . SHOULDER SURGERY     R shoulder  . TONSILLECTOMY      There were no vitals filed for this visit.  Subjective Assessment - 08/30/19 1023    Subjective  Breathing better but having more arthritis pain due to weather.  Has been practicing walking with the RW at home.  Husband bought pt a 4WW - hasn't been able to use it very much because it is too fast on hardwood floors.    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  Yes             Vestibular Assessment - 08/30/19 1026      Positional Testing   Dix-Hallpike  Dix-Hallpike Right;Dix-Hallpike Left    Horizontal Canal Testing  Horizontal Canal Right;Horizontal Canal Left      Dix-Hallpike Right   Dix-Hallpike Right Duration  1-2 seconds    Dix-Hallpike Right Symptoms  Right nystagmus      Dix-Hallpike Left   Dix-Hallpike Left Duration  0    Dix-Hallpike Left Symptoms  No nystagmus      Horizontal Canal Right   Horizontal Canal Right Duration  1-2 seconds    Horizontal Canal Right Symptoms  Geotrophic      Horizontal Canal  Left   Horizontal Canal Left Duration  0    Horizontal Canal Left Symptoms  Normal               [facility] Adult PT Treatment/Exercise - 08/30/19 1047      Ambulation/Gait   Ambulation/Gait  Yes    Ambulation/Gait Assistance  4: Min assist    Ambulation/Gait Assistance Details  with 4WW entering and exiting treatment area from waiting area with min A for guidance; also performed around the gym with R and L turns focusing on changing gait speed and using brakes to slow gait speed when needed    Ambulation Distance (Feet)  230 Feet    Assistive device  4-wheeled walker    Ambulation Surface  Level;Indoor    Ramp  3: Mod assist    Ramp Details (indicate cue type and reason)  with 4 wheeled walker x 2 reps with cues to keep patient away from edge, cues for increased step length when ascending and use of brakes when descending.      Vestibular Treatment/Exercise - 08/30/19 1035      Vestibular Treatment/Exercise   Vestibular Treatment Provided  Canalith Repositioning    Canalith Repositioning  Comment   self corrected during testing, no treatment needed        Balance Exercises - 08/30/19 1127      Balance Exercises: Standing   Standing Eyes Opened  Wide (BOA);Solid surface;Other (comment)    Other Standing Exercises  while standing with wide BOS without UE support pt performed 2 sets x 5 reps: bilat UE raises overhead holding dowel; 2 sets bringing dowel to 90 deg and performing chest presses x 5 reps; 1 set of bringing dowel to 90 deg and then horizontally ABD to L and R x 5 reps each side with min A from therapist to maintain balance and maintain head in midline - mild sway but no significant LOB        PT Education - 08/30/19 1130    Education Details  gait with rollator on level surfaces and ramp, standing balance with head righting and UE movement    Person(s) Educated  Patient;Spouse    Methods  Tactile cues;Verbal cues    Comprehension  Need further instruction        PT Short Term Goals - 08/07/19 1635      PT SHORT TERM GOAL #1   Title  Pt will demonstrate ability to perform ongoing HEP with supervision    Time  4    Period  Weeks    Status  Revised    Target Date  09/06/19      PT SHORT TERM GOAL #2   Title  Pt will ambulate x 230' over indoor surfaces with LRAD and min A with no significant LOB    Time  4    Period  Weeks    Status  Revised    Target Date  09/06/19      PT SHORT TERM GOAL #3   Title  Pt will increase BERG score by 4 points    Baseline  28/56    Time  4    Period  Weeks    Status  New    Target Date  09/06/19      PT SHORT TERM GOAL #4   Title  Pt will demonstrate ability to stand for 2 minutes without use of UE for support but performing dynamic activity with UE with supervision to return to fixing hair in standing    Time  4    Period  Weeks    Status  New    Target Date  09/06/19        PT Long Term Goals - 08/07/19 1638      PT LONG TERM GOAL #1   Title  Pt will demonstrate ability to perform final HEP with supervision    Baseline  does part of HEP with husband supervision, other part of HEP with daughter in law    Time  8    Period  Weeks    Status  Revised    Target Date  10/06/19      PT LONG TERM GOAL #2   Title  Pt will demonstrate negative positional testing for multiple canals    Time  8    Period  Weeks    Status  Revised    Target Date  10/06/19      PT LONG TERM GOAL #3   Title  Pt will demonstrate ability to perform sit <> stand and stand pivot transfers to L and R with LRAD and supervision with no LOB    Baseline  Min HHA with white cane    Time  8    Period  Weeks    Status  Revised    Target Date  10/06/19      PT LONG TERM GOAL #4   Title  Pt will demonstrate ability to ambulate 200' outside over pavement and negotiate curb with LRAD and min A    Baseline  min-mod A indoors with cane/white cane 150'; curb with mod-max A    Time  8    Period  Weeks    Status  Revised     Target Date  10/06/19      PT LONG TERM GOAL #5   Title  Pt will decrease falls risk as indicated by increase in BERG to >/= 34/56    Baseline  slight decrease to 28/56    Time  8    Period  Weeks    Status  Revised    Target Date  10/06/19            Plan - 08/30/19 1131    Clinical Impression Statement  Pt reporting return of vertigo but appeared to spontaneously resolve today during testing.  No CRM required today.  Continued gait training with rollator over level indoor surfaces and inclines with min guard over level surfaces for guidance and obstacle negotiation; increased assistance required on inclines due to difficulty adjusting to change in surface.  Continued to address pt  balance without UE support - pt continues to demonstrate increased sway but improved ability to self correct and maintain balance with ankle and hip strategies.  Will continue to address and progress towards LTG.    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, IBS, HTN, HLD, femoral artery thrombosis, diverticulosis, cataract, OA, R shoulder surgery, lumbar laminectomy, R cannulated hip surgery, and embolectomy    Examination-Activity Limitations  Bed Mobility;Locomotion Level;Stand;Transfers;Stairs    Examination-Participation Restrictions  Shop;Community Activity    Rehab Potential  Good    PT Frequency  1x / week    PT Duration  8 weeks    PT Treatment/Interventions  ADLs/Self Care Home Management;Canalith Repostioning;Gait training;Stair training;Functional mobility training;Therapeutic activities;Therapeutic exercise;Balance training;Neuromuscular re-education;Patient/family education;Vestibular    PT Next Visit Plan  PT IS COMPLETELY VISUALLY IMPAIRED - USES WHITE CANE.  Gait training with rollator - outside, inclines, curb.   If +2 work on stepping strategy with perturbations.  Seated on rockerboard - head/trunk righting, head tilts.  Step ups in // bars, sit <> stand with decreased UE support, corner balance with decreased UE support - dynamic UE movement to simulate fixing hair, wall bumps.    Recommended Other Services  continue to discuss aquatic therapy    Consulted and Agree with Plan of Care  Patient;Family member/caregiver    Family Member Consulted  Husband       Patient will benefit from skilled therapeutic intervention in order to improve the following deficits and impairments:  Decreased balance, Difficulty walking, Dizziness  Visit Diagnosis: Dizziness and giddiness  Repeated falls  Unsteadiness on feet  Difficulty in walking, not elsewhere classified     Problem List Patient Active Problem List   Diagnosis Date Noted  . Vertigo of central origin 02/09/2019  . Other peripheral vertigo, unspecified ear 02/09/2019  . Nonintractable headache 02/09/2019  . Pruritus 01/02/2019  . Hypokalemia 08/09/2018  . Long term (current) use of anticoagulants 08/01/2018  . Pre-operative clearance 08/01/2018  . Closed compression fracture of L4 vertebra ([place]) 07/11/2018  . Bilateral leg edema 07/11/2018  . Lung nodule < 6cm on CT 07/11/2018  . Hypothyroidism (acquired) 04/11/2018  . Fracture of femoral neck, right ([place]) 03/22/2018  . PAF (paroxysmal atrial fibrillation) ([place]) 03/18/2018  . 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 Herpes zoster without complication [id number]  . Jaw claudication 07/27/2017  . Temporal arteritis ([place]) 07/18/2017  . Vision, loss, sudden, bilateral 07/18/2017  . 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 Osteoarthritis 06/25/2016  . Anxiety 06/25/2016  . Back pain 07/18/2015  . 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 Carotid bruit present 06/05/2010  . Osteoporosis 06/03/2008  . History of colonic polyps 06/03/2008  . INTENTION TREMOR 12/06/2007  . IBS 10/16/2007  .  Hyperlipidemia 06/06/2007  . Essential hypertension 06/06/2007  . Chronic obstructive airway disease with asthma ([place]) 02/23/2007    Rico Junker, PT, DPT 08/30/19    11:40 AM    [facility] [address] [place], [place], [address] Phone: [phone]   Fax:  [phone]  Name: Crystal [name] MRN: [id number] Date of Birth: 1946/02/06

## 2019-09-03 ENCOUNTER — Ambulatory Visit: Payer: Medicare Other | Admitting: Physical Therapy

## 2019-09-08 ENCOUNTER — Other Ambulatory Visit: Payer: Self-pay | Admitting: Cardiology

## 2019-09-10 ENCOUNTER — Ambulatory Visit: Payer: Medicare Other | Admitting: Physical Therapy

## 2019-09-10 ENCOUNTER — Other Ambulatory Visit: Payer: Self-pay

## 2019-09-10 ENCOUNTER — Encounter: Payer: Self-pay | Admitting: Physical Therapy

## 2019-09-10 DIAGNOSIS — R262 Difficulty in walking, not elsewhere classified: Secondary | ICD-10-CM

## 2019-09-10 DIAGNOSIS — R42 Dizziness and giddiness: Secondary | ICD-10-CM | POA: Diagnosis not present

## 2019-09-10 DIAGNOSIS — R296 Repeated falls: Secondary | ICD-10-CM | POA: Diagnosis not present

## 2019-09-10 DIAGNOSIS — R2681 Unsteadiness on feet: Secondary | ICD-10-CM

## 2019-09-10 NOTE — Patient Instructions (Signed)
Access Code: [email or web address] URL: [email or web address] Date: 09/10/2019 Prepared by: Misty [place]  Exercises Sit to Stand with Immediate Step - 1 x daily - 7 x weekly - 1 sets - 10 reps Standing Single Leg Stance with Counter Support - 1 x daily - 7 x weekly - 1 sets - 10 reps

## 2019-09-10 NOTE — Therapy (Signed)
[facility] [address] [place], [place], [address] Phone: [phone]   Fax:  [phone]  Physical Therapy Treatment  Patient Details  Name: Crystal [name] MRN: [id number] Date of Birth: 12/16/1945 Referring Provider (PT): Stacy J Burns, MD   Encounter Date: 09/10/2019  PT End of Session - 09/10/19 1449    Visit Number  22    Number of Visits  27    Date for PT Re-Evaluation  10/06/19    Authorization Type  BCBS Medicare; $40 copay.  10th visit PN.  No VL    Progress Note Due on Visit  29    PT Start Time  1015    PT Stop Time  1105    PT Time Calculation (min)  50 min    Equipment Utilized During Treatment  Other (comment)   rollator   Activity Tolerance  Patient tolerated treatment well    Behavior During Therapy  WFL for tasks assessed/performed       Past Medical History:  Diagnosis Date  . Adenomatous colon polyp 2007 & 2010    Dr Stark  . Allergy    seasonal  . Arthritis   . Cataract   . Diverticulosis   . 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 Hyperlipidemia   . Hypertension   . IBS (irritable bowel syndrome)   . Legally blind   . MVP (mitral valve prolapse)   . Paroxysmal atrial fibrillation with rapid ventricular response ([facility]) 08/29/2017  . Peripheral vascular disease ([facility])   . RAD (reactive airway disease)     Past Surgical History:  Procedure Laterality Date  . ABDOMINAL HYSTERECTOMY     with USO for dysfunctionall menses  . APPENDECTOMY    . ARTERY BIOPSY Bilateral 07/21/2017   Procedure: BIOPSY BILATERAL TEMPORAL ARTERY;  Surgeon: Dickson, Christopher S, MD;  Location: [facility];  Service: Vascular;  Laterality: Bilateral;  . CARDIOVERSION N/A 06/16/2018   Procedure: CARDIOVERSION;  Surgeon: Crenshaw, Brian S, MD;  [facility]: [facility];  Service: Cardiovascular;  Laterality: N/A;  . CATARACT EXTRACTION W/ INTRAOCULAR LENS IMPLANT  2013   bilateral; Dr Beavis  . COLONOSCOPY   2013   negative , due 2018; Dr Stark  . COLONOSCOPY W/ POLYPECTOMY      X2 ; diverticulosis  . EMBOLECTOMY Right 08/29/2017   Procedure: Right Groin Exploration  ;  Surgeon: Early, Todd F, MD;  Location: [facility];  Service: Vascular;  Laterality: Right;  . HIP PINNING,CANNULATED Right 03/21/2018  . HIP PINNING,CANNULATED Right 03/21/2018   Procedure: CANNULATED HIP PINNING;  Surgeon: Swinteck, Brian, MD;  Location: [facility];  Service: Orthopedics;  Laterality: Right;  . LUMBAR LAMINECTOMY  2000   Dr Stern  . SHOULDER SURGERY     R shoulder  . TONSILLECTOMY      There were no vitals filed for this visit.  Subjective Assessment - 09/10/19 1021    Subjective  Still practicing with the rollator outside; still hasn't brought it in the house to use yet but will with daughter in law or friend.  Has been able to stand and fix hair for the past three days, still leaning a little bit against counter.    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  No/denies         [facility] PT Assessment - 09/10/19 1024      Standardized Balance Assessment   Standardized Balance Assessment  Berg Balance Test      Berg Balance Test   Sit to Stand  Able to stand  independently using hands    Standing Unsupported  Able to stand 2 minutes with supervision    Sitting with Back Unsupported but Feet Supported on Floor or Stool  Able to sit safely and securely 2 minutes    Stand to Sit  Controls descent by using hands    Transfers  Able to transfer with verbal cueing and /or supervision    Standing Unsupported with Eyes Closed  Able to  stand 10 seconds with supervision    Standing Unsupported with Feet Together  Needs help to attain position but able to stand for 30 seconds with feet together    From Standing, Reach Forward with Outstretched Arm  Can reach confidently >25 cm (10")    From Standing Position, Pick up Object from Floor  Able to pick up shoe, needs supervision    From Standing Position, Turn to Look Behind Over each Shoulder  Turn sideways only but maintains balance    Turn 360 Degrees  Needs assistance while turning    Standing Unsupported, Alternately Place Feet on Step/Stool  Able to complete >2 steps/needs minimal assist    Standing Unsupported, One Foot in Front  Able to take small step independently and hold 30 seconds    Standing on One Leg  Tries to lift leg/unable to hold 3 seconds but remains standing independently    Total Score  32    Berg comment:  32/56 still high falls risk                        Balance Exercises - 09/10/19 1456      Balance Exercises: Standing   Sit to Stand  Standard surface;Without upper extremity support   10 reps; then added immediate step L/R      Access Code: [email or web address] URL: [email or web address] Date: 09/10/2019 Prepared by: Misty Stanley  Exercises Sit to Stand with Immediate Step - 1 x daily - 7 x weekly - 1 sets - 10 reps Standing Single Leg Stance with Counter Support - 1 x daily - 7 x weekly - 1 sets - 10 reps    PT Education - 09/10/19 1448    Education Details  progress towards goals; areas to continue to focus on, revision of HEP    Person(s) Educated  Patient;Spouse    Methods  Explanation;Demonstration;Handout    Comprehension  Verbalized understanding;Returned demonstration       PT Short Term Goals - 09/10/19 1040      PT SHORT TERM GOAL #1   Title  Pt will demonstrate ability to perform ongoing HEP with supervision    Time  4    Period  Weeks    Status  Achieved    Target Date  09/06/19      PT SHORT TERM  GOAL #2   Title  Pt will ambulate x 230' over indoor surfaces with LRAD and min A with no significant LOB    Time  4    Period  Weeks    Status  Achieved  Target Date  09/06/19      PT SHORT TERM GOAL #3   Title  Pt will increase BERG score by 4 points    Baseline  32/56    Time  4    Period  Weeks    Status  Achieved    Target Date  09/06/19      PT SHORT TERM GOAL #4   Title  Pt will demonstrate ability to stand for 2 minutes without use of UE for support but performing dynamic activity with UE with supervision to return to fixing hair in standing    Time  4    Period  Weeks    Status  Achieved    Target Date  09/06/19        PT Long Term Goals - 08/07/19 1638      PT LONG TERM GOAL #1   Title  Pt will demonstrate ability to perform final HEP with supervision    Baseline  does part of HEP with husband supervision, other part of HEP with daughter in law    Time  8    Period  Weeks    Status  Revised    Target Date  10/06/19      PT LONG TERM GOAL #2   Title  Pt will demonstrate negative positional testing for multiple canals    Time  8    Period  Weeks    Status  Revised    Target Date  10/06/19      PT LONG TERM GOAL #3   Title  Pt will demonstrate ability to perform sit <> stand and stand pivot transfers to L and R with LRAD and supervision with no LOB    Baseline  Min HHA with white cane    Time  8    Period  Weeks    Status  Revised    Target Date  10/06/19      PT LONG TERM GOAL #4   Title  Pt will demonstrate ability to ambulate 200' outside over pavement and negotiate curb with LRAD and min A    Baseline  min-mod A indoors with cane/white cane 150'; curb with mod-max A    Time  8    Period  Weeks    Status  Revised    Target Date  10/06/19      PT LONG TERM GOAL #5   Title  Pt will decrease falls risk as indicated by increase in BERG to >/= 34/56    Baseline  slight decrease to 28/56    Time  8    Period  Weeks    Status  Revised    Target  Date  10/06/19            Plan - 09/10/19 1450    Clinical Impression Statement  No return of dizziness.  Performed assessment of STG with pt demonstrating good progress.  Pt has met all STG and demonstrates improvement in standing balance and is now able to stand without UE support fix hair but still props self against counter.  Pt is demonstrating safer gait with rollator and will continue to practice at home with family.  Due to progress rest of session focused on upgrading current HEP to progress standing activities without UE support, weight shifting, step initiation and SLS.  Will continue to progress HEP next session.    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, IBS, HTN, HLD, femoral artery thrombosis, diverticulosis, cataract, OA, R shoulder surgery, lumbar laminectomy, R cannulated hip surgery, and embolectomy    Examination-Activity Limitations  Bed Mobility;Locomotion Level;Stand;Transfers;Stairs    Examination-Participation Restrictions  Shop;Community Activity    Rehab Potential  Good    PT Frequency  1x / week    PT Duration  8 weeks    PT Treatment/Interventions  ADLs/Self Care Home Management;Canalith Repostioning;Gait training;Stair training;Functional mobility training;Therapeutic activities;Therapeutic exercise;Balance training;Neuromuscular re-education;Patient/family education;Vestibular    PT Next Visit Plan  PT IS COMPLETELY VISUALLY IMPAIRED - USES WHITE CANE.  Continue to progress HEP focusing on staggered stance, weight shifting, balance reactions, wall bumps, etc.  Gait training with rollator - outside, inclines, curb.  If +2 work on stepping strategy with perturbations.  Seated on rockerboard - head/trunk righting, head tilts.  Step ups in // bars, sit <> stand with decreased UE  support, corner balance with decreased UE support - dynamic UE movement to simulate fixing hair, wall bumps.    Consulted and Agree with Plan of Care  Patient;Family member/caregiver    Family Member Consulted  Husband       Patient will benefit from skilled therapeutic intervention in order to improve the following deficits and impairments:  Decreased balance, Difficulty walking, Dizziness  Visit Diagnosis: Repeated falls  Unsteadiness on feet  Difficulty in walking, not elsewhere classified     Problem List Patient Active Problem List   Diagnosis Date Noted  . Vertigo of central origin 02/09/2019  . Other peripheral vertigo, unspecified ear 02/09/2019  . Nonintractable headache 02/09/2019  . Pruritus 01/02/2019  . Hypokalemia 08/09/2018  . Long term (current) use of anticoagulants 08/01/2018  . Pre-operative clearance 08/01/2018  . Closed compression fracture of L4 vertebra ([place]) 07/11/2018  . Bilateral leg edema 07/11/2018  . Lung nodule < 6cm on CT 07/11/2018  . Hypothyroidism (acquired) 04/11/2018  . Fracture of femoral neck, right ([place]) 03/22/2018  . PAF (paroxysmal atrial fibrillation) ([place]) 03/18/2018  . Femoral artery thrombosis, right ([place]) 09/01/2017  . Herpes zoster without complication 09/47/0962  . Jaw claudication 07/27/2017  . Temporal arteritis ([place]) 07/18/2017  . Vision, loss, sudden, bilateral 07/18/2017  . 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 Osteoarthritis 06/25/2016  . Anxiety 06/25/2016  . Back pain 07/18/2015  . 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 Carotid bruit present 06/05/2010  . Osteoporosis 06/03/2008  . History of colonic polyps 06/03/2008  . INTENTION TREMOR 12/06/2007  . IBS 10/16/2007  . Hyperlipidemia 06/06/2007  . Essential hypertension 06/06/2007  . Chronic obstructive airway disease with asthma ([place]) 02/23/2007    Rico Junker, PT, DPT 09/10/19    2:57 PM    [facility] [address] [place], [place], [address] Phone: [phone]   Fax:  [phone]  Name: ATHEA HALEY MRN: [id number] Date of Birth: 1946/01/18

## 2019-09-15 ENCOUNTER — Other Ambulatory Visit: Payer: Self-pay | Admitting: Internal Medicine

## 2019-09-17 ENCOUNTER — Encounter: Payer: Self-pay | Admitting: Physical Therapy

## 2019-09-17 ENCOUNTER — Ambulatory Visit: Payer: Medicare Other | Attending: Internal Medicine | Admitting: Physical Therapy

## 2019-09-17 ENCOUNTER — Other Ambulatory Visit: Payer: Self-pay

## 2019-09-17 DIAGNOSIS — R2681 Unsteadiness on feet: Secondary | ICD-10-CM

## 2019-09-17 DIAGNOSIS — R42 Dizziness and giddiness: Secondary | ICD-10-CM | POA: Insufficient documentation

## 2019-09-17 DIAGNOSIS — R296 Repeated falls: Secondary | ICD-10-CM | POA: Diagnosis not present

## 2019-09-17 DIAGNOSIS — H8111 Benign paroxysmal vertigo, right ear: Secondary | ICD-10-CM | POA: Diagnosis not present

## 2019-09-17 DIAGNOSIS — H8112 Benign paroxysmal vertigo, left ear: Secondary | ICD-10-CM | POA: Diagnosis not present

## 2019-09-17 DIAGNOSIS — R262 Difficulty in walking, not elsewhere classified: Secondary | ICD-10-CM | POA: Diagnosis not present

## 2019-09-17 NOTE — Patient Instructions (Signed)
Access Code: [email or web address] URL: [email or web address] Date: 09/17/2019 Prepared by: Misty Stanley  Exercises Sit to Stand with Immediate Step - 1 x daily - 7 x weekly - 1 sets - 10 reps Standing Single Leg Stance with Counter Support - 1 x daily - 7 x weekly - 1 sets - 10 reps Standing Romberg to 1/2 Tandem Stance - 1 x daily - 7 x weekly - 2 sets - 5 reps Heel Toe Raises with Counter Support - 1 x daily - 7 x weekly - 10 reps - 3 sets Standing Anterior Posterior Weight Shift with Chair - 1 x daily - 7 x weekly - 2 sets - 10 reps

## 2019-09-17 NOTE — Therapy (Signed)
[facility] [address] [place], [place], [address] Phone: [phone]   Fax:  [phone]  Physical Therapy Treatment  Patient Details  Name: Crystal [name] MRN: [id number] Date of Birth: 1946-05-16 Referring Provider (PT): [name], MD   Encounter Date: 09/17/2019  PT End of Session - 09/17/19 2233    Visit Number  23    Number of Visits  27    Date for PT Re-Evaluation  10/06/19    Authorization Type  [employer] Medicare; $40 copay.  10th visit PN.  No VL    Progress Note Due on Visit  29    PT Start Time  1106    PT Stop Time  1151    PT Time Calculation (min)  45 min    Equipment Utilized During Treatment  Other (comment)   rollator   Activity Tolerance  Patient tolerated treatment well    Behavior During Therapy  WFL for tasks assessed/performed       Past Medical History:  Diagnosis Date  . Adenomatous colon polyp 2007 & 2010    Dr [name]  . Allergy    seasonal  . Arthritis   . Cataract   . Diverticulosis   . 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 Hyperlipidemia   . Hypertension   . IBS (irritable bowel syndrome)   . Legally blind   . MVP (mitral valve prolapse)   . Paroxysmal atrial fibrillation with rapid ventricular response ([place]) 08/29/2017  . Peripheral vascular disease ([place])   . RAD (reactive airway disease)     Past Surgical History:  Procedure Laterality Date  . ABDOMINAL HYSTERECTOMY     with USO for dysfunctionall menses  . APPENDECTOMY    . ARTERY BIOPSY Bilateral 07/21/2017   Procedure: BIOPSY BILATERAL TEMPORAL ARTERY;  Surgeon: [name], MD;  Location: [facility];  Service: Vascular;  Laterality: Bilateral;  . CARDIOVERSION N/A 06/16/2018   Procedure: CARDIOVERSION;  Surgeon: [name], MD;  Location: [facility];  Service: Cardiovascular;  Laterality: N/A;  . CATARACT EXTRACTION W/ INTRAOCULAR LENS IMPLANT  2013   bilateral; Dr [name]  . COLONOSCOPY   2013   negative , due 2018; Dr [name]  . COLONOSCOPY W/ POLYPECTOMY      X2 ; diverticulosis  . EMBOLECTOMY Right 08/29/2017   Procedure: Right Groin Exploration  ;  Surgeon: Rosetta Posner, MD;  Location: [facility];  Service: Vascular;  Laterality: Right;  . HIP PINNING,CANNULATED Right 03/21/2018  . HIP PINNING,CANNULATED Right 03/21/2018   Procedure: CANNULATED HIP PINNING;  Surgeon: Rod Can, MD;  Location: [facility];  Service: Orthopedics;  Laterality: Right;  . LUMBAR LAMINECTOMY  2000   Dr Vertell Limber  . SHOULDER SURGERY     R shoulder  . TONSILLECTOMY      There were no vitals filed for this visit.  Subjective Assessment - 09/17/19 1111    Subjective  Did a lot of walking outside yesterday, a little sore from walking uphill and UE were very sore, not sure if her rollator is set up the same.  Daughter-in-law (who is a PT) is with pt today.    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  No/denies                       [facility] Adult PT Treatment/Exercise - 09/17/19 1112      Transfers   Transfers  Sit to Stand;Stand to Sit    Sit to Stand  5: Supervision    Sit to Stand Details (indicate cue type and reason)  without UE support and no assistance to maintain balance once standing    Stand to Sit  5: Supervision      Ambulation/Gait   Ambulation/Gait  Yes    Ambulation/Gait Assistance  4: Min assist    Ambulation/Gait Assistance Details  over level ground only requires assistance to guide rollator; on ramp required intermittent verbal cues for guidance and sequencing with rollator,  verbal cues for upright posture and use of brakes when descending curb; performed x 2 sets    Ambulation Distance (Feet)  115 Feet    Assistive device  Rollator    Gait Pattern  Step-through pattern    Ambulation Surface  Other (comment)   ramp   Ramp  4: Min assist    Ramp Details (indicate cue type and reason)  see above; using rollator      Therapeutic Activites    Therapeutic Activities  Other Therapeutic Activities    Other Therapeutic Activities  reviewed with pt and daughter in law best way to determine appropriate height for rollator at home; daughter in law is a PT and is aware of how to set up correct height but wanted to confirm with this therapist so that she can confirm with husband; educated pt as to reason for lowering handles and how raising handles too high may cause increased shoulder/UE pain and does not lead to more upright posture      Exercises   Exercises  Other Exercises    Other Exercises   updated and progressed HEP as documented below.  Pt return demonstrated all exercises with daughter in law observing in order to assist at home        Access Code: [email or web address] URL: [email or web address] Date: 09/17/2019 Prepared by: Misty Stanley  Exercises Sit to Stand with Immediate Step - 1 x daily - 7 x weekly - 1 sets - 10 reps Standing Single Leg Stance with Counter Support - 1 x daily - 7 x weekly - 1 sets - 10 reps Standing Romberg to 1/2 Tandem Stance - 1 x daily - 7 x weekly - 2 sets - 5 reps Heel Toe Raises with Counter Support - 1 x daily - 7 x weekly - 10 reps - 3 sets Standing Anterior Posterior Weight Shift with Chair - 1 x daily - 7 x weekly - 2 sets - 10 reps     PT Education - 09/17/19 2233    Education Details  updated HEP, ramp with rollator, setting up rollator handle height    Person(s) Educated  Patient;Child(ren)    Methods  Explanation;Demonstration;Handout    Comprehension  Verbalized understanding;Returned demonstration        PT Short Term Goals - 09/10/19 1040      PT SHORT TERM GOAL #1   Title  Pt will demonstrate ability to perform ongoing HEP with supervision    Time  4    Period  Weeks    Status  Achieved    Target Date  09/06/19  PT SHORT TERM GOAL #2   Title  Pt will ambulate x 230' over indoor surfaces with LRAD and min A with no significant LOB    Time  4    Period  Weeks    Status  Achieved    Target Date  09/06/19      PT SHORT TERM GOAL #3   Title  Pt will increase BERG score by 4 points    Baseline  32/56    Time  4    Period  Weeks    Status  Achieved    Target Date  09/06/19      PT SHORT TERM GOAL #4   Title  Pt will demonstrate ability to stand for 2 minutes without use of UE for support but performing dynamic activity with UE with supervision to return to fixing hair in standing    Time  4    Period  Weeks    Status  Achieved    Target Date  09/06/19        PT Long Term Goals - 08/07/19 1638      PT LONG TERM GOAL #1   Title  Pt will demonstrate ability to perform final HEP with supervision    Baseline  does part of HEP with husband supervision, other part of HEP with daughter in law    Time  8    Period  Weeks    Status  Revised    Target Date  10/06/19      PT LONG TERM GOAL #2   Title  Pt will demonstrate negative positional testing for multiple canals    Time  8    Period  Weeks    Status  Revised    Target Date  10/06/19      PT LONG TERM GOAL #3   Title  Pt will demonstrate ability to perform sit <> stand and stand pivot transfers to L and R with LRAD and supervision with no LOB    Baseline  Min HHA with white cane    Time  8    Period  Weeks    Status  Revised    Target Date  10/06/19      PT LONG TERM GOAL #4   Title  Pt will demonstrate ability to ambulate 200' outside over pavement and negotiate curb with LRAD and min A    Baseline  min-mod A indoors with cane/white cane 150'; curb with mod-max A    Time  8    Period  Weeks    Status   Revised    Target Date  10/06/19      PT LONG TERM GOAL #5   Title  Pt will decrease falls risk as indicated by increase in BERG to >/= 34/56    Baseline  slight decrease to 28/56    Time  8    Period  Weeks    Status  Revised    Target Date  10/06/19            Plan - 09/17/19 2234    Clinical Impression Statement  Still reporting resolution of dizziness.  Continued education for rollator set up and gait training on rollator on inclines; pt has progressed to requiring light min A over level and inclined surfaces with verbal cues for use of brakes, upright posture and maintaining step length.  Continued to progress HEP with daughter in law present to observe in order to continue to assist pt at  home with HEP.    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, IBS, HTN, HLD, femoral artery thrombosis, diverticulosis, cataract, OA, R shoulder surgery, lumbar laminectomy, R cannulated hip surgery, and embolectomy    Examination-Activity Limitations  Bed Mobility;Locomotion Level;Stand;Transfers;Stairs    Examination-Participation Restrictions  Shop;Community Activity    Rehab Potential  Good    PT Frequency  1x / week    PT Duration  8 weeks    PT Treatment/Interventions  ADLs/Self Care Home Management;Canalith Repostioning;Gait training;Stair training;Functional mobility training;Therapeutic activities;Therapeutic exercise;Balance training;Neuromuscular re-education;Patient/family education;Vestibular    PT Next Visit Plan  PT IS COMPLETELY VISUALLY IMPAIRED - USES WHITE CANE.  When recert on 0000000- do aquatic therapy for a few visits?  Fill out referral.  Gait training with rollator - outside, inclines, curb.  If +2 work on stepping strategy with perturbations.  Seated on rockerboard - head/trunk righting, head  tilts.  Step ups in // bars, sit <> stand with decreased UE support, corner balance with decreased UE support - dynamic UE movement to simulate fixing hair, wall bumps.    Consulted and Agree with Plan of Care  Patient;Family member/caregiver    Family Member Consulted  Husband       Patient will benefit from skilled therapeutic intervention in order to improve the following deficits and impairments:  Decreased balance, Difficulty walking, Dizziness  Visit Diagnosis: Repeated falls  Unsteadiness on feet  Difficulty in walking, not elsewhere classified  Dizziness and giddiness     Problem List Patient Active Problem List   Diagnosis Date Noted  . Vertigo of central origin 02/09/2019  . Other peripheral vertigo, unspecified ear 02/09/2019  . Nonintractable headache 02/09/2019  . Pruritus 01/02/2019  . Hypokalemia 08/09/2018  . Long term (current) use of anticoagulants 08/01/2018  . Pre-operative clearance 08/01/2018  . Closed compression fracture of L4 vertebra ([place]) 07/11/2018  . Bilateral leg edema 07/11/2018  . Lung nodule < 6cm on CT 07/11/2018  . Hypothyroidism (acquired) 04/11/2018  . Fracture of femoral neck, right ([place]) 03/22/2018  . PAF (paroxysmal atrial fibrillation) ([place]) 03/18/2018  . 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 Herpes zoster without complication [id number]  . Jaw claudication 07/27/2017  . Temporal arteritis ([place]) 07/18/2017  . Vision, loss, sudden, bilateral 07/18/2017  . 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 Osteoarthritis 06/25/2016  . Anxiety 06/25/2016  . Back pain 07/18/2015  . 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 Carotid bruit present 06/05/2010  . Osteoporosis 06/03/2008  . History of colonic polyps 06/03/2008  . INTENTION TREMOR 12/06/2007  . IBS 10/16/2007  . Hyperlipidemia 06/06/2007  . Essential hypertension 06/06/2007  . Chronic obstructive airway disease with asthma ([place]) 02/23/2007    Rico Junker, PT, DPT 09/17/19    10:40  PM    [facility] [address] [place], [place], [address] Phone: [phone]   Fax:  [phone]  Name: Crystal [name] MRN: [id number] Date of Birth: 05-12-1946

## 2019-09-18 ENCOUNTER — Other Ambulatory Visit: Payer: Self-pay | Admitting: Internal Medicine

## 2019-09-21 ENCOUNTER — Telehealth: Payer: Self-pay | Admitting: Internal Medicine

## 2019-09-21 ENCOUNTER — Ambulatory Visit (INDEPENDENT_AMBULATORY_CARE_PROVIDER_SITE_OTHER): Payer: Medicare Other | Admitting: Internal Medicine

## 2019-09-21 ENCOUNTER — Encounter: Payer: Self-pay | Admitting: Internal Medicine

## 2019-09-21 ENCOUNTER — Other Ambulatory Visit: Payer: Self-pay

## 2019-09-21 VITALS — BP 128/66 | HR 72 | Temp 97.8°F | Ht 64.0 in | Wt 136.0 lb

## 2019-09-21 DIAGNOSIS — M8000XS Age-related osteoporosis with current pathological fracture, unspecified site, sequela: Secondary | ICD-10-CM | POA: Diagnosis not present

## 2019-09-21 DIAGNOSIS — M109 Gout, unspecified: Secondary | ICD-10-CM | POA: Diagnosis not present

## 2019-09-21 DIAGNOSIS — M1A9XX0 Chronic gout, unspecified, without tophus (tophi): Secondary | ICD-10-CM | POA: Insufficient documentation

## 2019-09-21 MED ORDER — PREDNISONE 10 MG PO TABS: ORAL_TABLET | ORAL | 0 refills | Status: DC

## 2019-09-21 MED ORDER — GABAPENTIN 300 MG PO CAPS: 600.0000 mg | ORAL_CAPSULE | Freq: Every day | ORAL | 1 refills | Status: AC

## 2019-09-21 NOTE — Assessment & Plan Note (Signed)
Acute R MTP Unknown cause Start prednisone taper - 30 mg -- hold daily prednisone until after taper, then restart She will call if symptoms do not resolve completely

## 2019-09-21 NOTE — Assessment & Plan Note (Signed)
dexa due - ordered On fosamax by rheum

## 2019-09-21 NOTE — Progress Notes (Signed)
Subjective:    Patient ID: Crystal [name], female    DOB: 1946-02-20, 74 y.o.   MRN: [id number]  HPI The patient is here for an acute visit for gout.  Her husband is here with her.  She is blind.   She has had gout in the past - last summer and took a prednisone taper.  She woke up this morning with pain in her right big toe.  It feels a little warm.  The pain is not severe yet, but she feels the gout is coming and wants to stop it before it gets worse.  She also noticed some pain in her left big toe, but is unsure if that is gout or arthritis.  She denies fever/chills, N/T.  She denies injury to her foot.       Medications and allergies reviewed with patient and updated if appropriate.  Patient Active Problem List   Diagnosis Date Noted  . Vertigo of central origin 02/09/2019  . Other peripheral vertigo, unspecified ear 02/09/2019  . Nonintractable headache 02/09/2019  . Pruritus 01/02/2019  . Hypokalemia 08/09/2018  . Long term (current) use of anticoagulants 08/01/2018  . Pre-operative clearance 08/01/2018  . Closed compression fracture of L4 vertebra ([place]) 07/11/2018  . Bilateral leg edema 07/11/2018  . Lung nodule < 6cm on CT 07/11/2018  . Hypothyroidism (acquired) 04/11/2018  . Fracture of femoral neck, right ([place]) 03/22/2018  . PAF (paroxysmal atrial fibrillation) ([place]) 03/18/2018  . 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 Herpes zoster without complication [id number]  . Jaw claudication 07/27/2017  . Temporal arteritis ([place]) 07/18/2017  . Vision, loss, sudden, bilateral 07/18/2017  . 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 Osteoarthritis 06/25/2016  . Anxiety 06/25/2016  . Back pain 07/18/2015  . 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 Carotid bruit present 06/05/2010  . Osteoporosis 06/03/2008  . History of colonic polyps 06/03/2008  . INTENTION TREMOR 12/06/2007  . IBS 10/16/2007  . Hyperlipidemia 06/06/2007  . Essential hypertension 06/06/2007  . Chronic obstructive  airway disease with asthma ([place]) 02/23/2007    Current Outpatient Medications on File Prior to Visit  Medication Sig Dispense Refill  . albuterol (VENTOLIN HFA) 108 (90 Base) MCG/ACT inhaler INHALE 1 PUFF INTO THE LUNGS AS DIRECTED IF NEEDED 8.5 g 0  . alendronate (FOSAMAX) 70 MG tablet Take 70 mg by mouth every Monday.     Marland Kitchen amiodarone (PACERONE) 200 MG tablet Take 1 tablet (200 mg total) by mouth daily. 90 tablet 3  . amoxicillin (AMOXIL) 500 MG tablet Take 4 tablets by mouth as needed. Pt take 4 tablets prior dental work    . Calcium Citrate-Vitamin D (CALCIUM + D PO) Take 1 tablet by mouth daily.    Marland Kitchen CARTIA XT 120 MG 24 hr capsule TAKE ONE CAPSULE BY MOUTH DAILY 90 capsule 0  . Cholecalciferol (VITAMIN D3) 1000 units CAPS Take 1 capsule (1,000 Units total) by mouth daily with lunch. 60 capsule 0  . ELIQUIS 5 MG TABS tablet TAKE 1 TABLET BY MOUTH TWO TIMES A DAY 180 tablet 1  . furosemide (LASIX) 40 MG tablet TAKE ONE TABLET BY MOUTH DAILY 90 tablet 0  . gabapentin (NEURONTIN) 100 MG capsule Take 5 capsules (500 mg total) by mouth at bedtime. 30 capsule 0  . KLOR-CON M20 20 MEQ tablet TAKE TWO TABLETS BY MOUTH DAILY 180 tablet 0  . levothyroxine (SYNTHROID) 50 MCG tablet Take 1 tablet (50 mcg total) by mouth daily. 90 tablet 3  .  LORazepam (ATIVAN) 1 MG tablet TAKE ONE TABLET BY MOUTH EVERY NIGHT AT BEDTIME 30 tablet 4  . methocarbamol (ROBAXIN) 500 MG tablet TAKE ONE TABLET BY MOUTH EVERY 6 HOURS AS NEEDED FOR MUSCLE SPASMS 120 tablet 1  . montelukast (SINGULAIR) 10 MG tablet TAKE ONE TABLET BY MOUTH EVERY NIGHT AT BEDTIME 90 tablet 0  . predniSONE (DELTASONE) 5 MG tablet Take 5 mg by mouth daily with breakfast.     . SYMBICORT 160-4.5 MCG/ACT inhaler INHALE ONE TO TWO PUFFS BY MOUTH EVERY 12 HOURS, THEN GARGLE AND SPIT AFTER USE 10.2 g 9  . theophylline (UNIPHYL) 400 MG 24 hr tablet TAKE 1/2 TABLET BY MOUTH TWO TIMES A DAY 90 tablet 0  . traMADol (ULTRAM) 50 MG tablet Take 1 tablet (50  mg total) by mouth every 6 (six) hours as needed for moderate pain. 90 tablet 0  . triamcinolone cream (KENALOG) 0.1 % Apply 1 application topically 2 (two) times daily. 30 g 0   No current facility-administered medications on file prior to visit.    Past Medical History:  Diagnosis Date  . Adenomatous colon polyp 2007 & 2010    Dr [name]  . Allergy    seasonal  . Arthritis   . Cataract   . Diverticulosis   . 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 Hyperlipidemia   . Hypertension   . IBS (irritable bowel syndrome)   . Legally blind   . MVP (mitral valve prolapse)   . Paroxysmal atrial fibrillation with rapid ventricular response ([place]) 08/29/2017  . Peripheral vascular disease ([place])   . RAD (reactive airway disease)     Past Surgical History:  Procedure Laterality Date  . ABDOMINAL HYSTERECTOMY     with USO for dysfunctionall menses  . APPENDECTOMY    . ARTERY BIOPSY Bilateral 07/21/2017   Procedure: BIOPSY BILATERAL TEMPORAL ARTERY;  Surgeon: [name], MD;  Location: [facility];  Service: Vascular;  Laterality: Bilateral;  . CARDIOVERSION N/A 06/16/2018   Procedure: CARDIOVERSION;  Surgeon: [name], MD;  Location: [facility];  Service: Cardiovascular;  Laterality: N/A;  . CATARACT EXTRACTION W/ INTRAOCULAR LENS IMPLANT  2013   bilateral; Dr [name]  . COLONOSCOPY  2013   negative , due 2018; Dr [name]  . COLONOSCOPY W/ POLYPECTOMY      X2 ; diverticulosis  . EMBOLECTOMY Right 08/29/2017   Procedure: Right Groin Exploration  ;  Surgeon: Rosetta Posner, MD;  Location: [facility];  Service: Vascular;  Laterality: Right;  . HIP PINNING,CANNULATED Right 03/21/2018  . HIP PINNING,CANNULATED Right 03/21/2018   Procedure: CANNULATED HIP PINNING;  Surgeon: Rod Can, MD;  Location: [facility];  Service: Orthopedics;  Laterality: Right;  . LUMBAR LAMINECTOMY  2000   Dr Vertell Limber  . SHOULDER SURGERY     R shoulder  . TONSILLECTOMY      Social History    Socioeconomic History  . Marital status: Married    Spouse name: Not on file  . Number of children: Not on file  . Years of education: Not on file  . Highest education level: Not on file  Occupational History  . Occupation: retired  Tobacco Use  . Smoking status: Former Smoker    Packs/day: 1.00    Years: 23.00    Pack years: 23.00    Quit date: 06/14/1989    Years since quitting: 30.2  . Smokeless tobacco: Never Used  . Tobacco comment: smoked 1961-1991 , up to < 1  ppd  Substance and Sexual Activity  . Alcohol use: Yes    Alcohol/week: 14.0 standard drinks    Types: 14 Glasses of wine per week    Comment: Wine  . Drug use: No  . Sexual activity: Not on file  Other Topics Concern  . Not on file  Social History Narrative  . Not on file   Social Determinants of Health   Financial Resource Strain:   . Difficulty of Paying Living Expenses:   Food Insecurity:   . Worried About Charity fundraiser in the Last Year:   . Arboriculturist in the Last Year:   Transportation Needs:   . Film/video editor (Medical):   Marland Kitchen Lack of Transportation (Non-Medical):   Physical Activity:   . Days of Exercise per Week:   . Minutes of Exercise per Session:   Stress:   . Feeling of Stress :   Social Connections:   . Frequency of Communication with Friends and Family:   . Frequency of Social Gatherings with Friends and Family:   . Attends Religious Services:   . Active Member of Clubs or Organizations:   . Attends Archivist Meetings:   Marland Kitchen Marital Status:     Family History  Problem Relation Age of Onset  . Heart attack Brother 33  . Parkinsonism Mother   . Stroke Mother   . Heart attack Father        pre 21  . Diabetes Sister   . COPD Sister        X 3  . Other Son        suicide 2016  . Colon cancer Neg Hx   . Cancer Neg Hx   . Esophageal cancer Neg Hx   . Liver cancer Neg Hx   . Pancreatic cancer Neg Hx   . Rectal cancer Neg Hx   . Stomach cancer Neg Hx      Review of Systems Per HPI    Objective:   Vitals:   09/21/19 1100  BP: 128/66  Pulse: 72  Temp: 97.8 F (36.6 C)  SpO2: 97%   BP Readings from Last 3 Encounters:  09/21/19 128/66  07/24/19 (!) 146/64  07/17/19 (!) 156/68   Wt Readings from Last 3 Encounters:  09/21/19 136 lb (61.7 kg)  07/24/19 131 lb (59.4 kg)  07/09/19 133 lb (60.3 kg)   Body mass index is 23.34 kg/m.   Physical Exam    Constitutional: Appears well-developed and well-nourished. No distress.  MSK:  Right first MTP with slight erythema, swelling and warm, tenderness with active and passive movement.  No other toes on right side are tender.  , no deformity or skin lesion/breaks in skin, No tenderness left first MTP Neuro: normal sensation in R foot/toes Skin: Skin is warm and dry. Not diaphoretic.       Assessment & Plan:    See Problem List for Assessment and Plan of chronic medical problems.    This visit occurred during the SARS-CoV-2 public health emergency.  Safety protocols were in place, including screening questions prior to the visit, additional usage of staff PPE, and extensive cleaning of exam room while observing appropriate contact time as indicated for disinfecting solutions.

## 2019-09-21 NOTE — Patient Instructions (Addendum)
Start prednisone taper for the gout.  Hold the daily prednisone until you complete the taper.   A bone density was ordered.      Please call if there is no improvement in your symptoms.

## 2019-09-21 NOTE — Telephone Encounter (Signed)
Did not see message until now and she has arrived

## 2019-09-21 NOTE — Telephone Encounter (Signed)
New Message:  Pt's spouse is calling and wanting to know if Dr. [name] can just sent the pt some medication in for her gout. He states she is supposed to have an appt today but can't even get her shoe on because of the big toe.

## 2019-09-24 ENCOUNTER — Other Ambulatory Visit: Payer: Self-pay

## 2019-09-24 ENCOUNTER — Ambulatory Visit: Payer: Medicare Other | Admitting: Physical Therapy

## 2019-09-24 ENCOUNTER — Encounter: Payer: Self-pay | Admitting: Physical Therapy

## 2019-09-24 DIAGNOSIS — R2681 Unsteadiness on feet: Secondary | ICD-10-CM | POA: Diagnosis not present

## 2019-09-24 DIAGNOSIS — R42 Dizziness and giddiness: Secondary | ICD-10-CM | POA: Diagnosis not present

## 2019-09-24 DIAGNOSIS — R262 Difficulty in walking, not elsewhere classified: Secondary | ICD-10-CM

## 2019-09-24 DIAGNOSIS — R296 Repeated falls: Secondary | ICD-10-CM | POA: Diagnosis not present

## 2019-09-24 DIAGNOSIS — H8112 Benign paroxysmal vertigo, left ear: Secondary | ICD-10-CM | POA: Diagnosis not present

## 2019-09-24 DIAGNOSIS — H8111 Benign paroxysmal vertigo, right ear: Secondary | ICD-10-CM | POA: Diagnosis not present

## 2019-09-24 NOTE — Therapy (Signed)
[facility] [address] [place], [place], [address] Phone: [phone]   Fax:  [phone]  Physical Therapy Treatment  Patient Details  Name: Crystal [name] MRN: [id number] Date of Birth: 04-14-1946 Referring Provider (PT): [name], MD   Encounter Date: 09/24/2019  PT End of Session - 09/24/19 1225    Visit Number  24    Number of Visits  27    Date for PT Re-Evaluation  10/06/19    Authorization Type  [employer] Medicare; $40 copay.  10th visit PN.  No VL    Progress Note Due on Visit  29    PT Start Time  1017    PT Stop Time  1100    PT Time Calculation (min)  43 min    Equipment Utilized During Treatment  Other (comment)   rollator   Activity Tolerance  Patient tolerated treatment well    Behavior During Therapy  WFL for tasks assessed/performed       Past Medical History:  Diagnosis Date  . Adenomatous colon polyp 2007 & 2010    Dr [name]  . Allergy    seasonal  . Arthritis   . Cataract   . Diverticulosis   . 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 Hyperlipidemia   . Hypertension   . IBS (irritable bowel syndrome)   . Legally blind   . MVP (mitral valve prolapse)   . Paroxysmal atrial fibrillation with rapid ventricular response ([facility] [facility] [place]) 08/29/2017  . Peripheral vascular disease ([facility])   . RAD (reactive airway disease)     Past Surgical History:  Procedure Laterality Date  . ABDOMINAL HYSTERECTOMY     with USO for dysfunctionall menses  . APPENDECTOMY    . ARTERY BIOPSY Bilateral 07/21/2017   Procedure: BIOPSY BILATERAL TEMPORAL ARTERY;  Surgeon: [name], MD;  Location: [facility];  Service: Vascular;  Laterality: Bilateral;  . CARDIOVERSION N/A 06/16/2018   Procedure: CARDIOVERSION;  Surgeon: [name], MD;  Location: [facility];  Service: Cardiovascular;  Laterality: N/A;  . CATARACT EXTRACTION W/ INTRAOCULAR LENS IMPLANT  2013   bilateral; Dr [name]  . COLONOSCOPY   2013   negative , due 2018; Dr [name]  . COLONOSCOPY W/ POLYPECTOMY      X2 ; diverticulosis  . EMBOLECTOMY Right 08/29/2017   Procedure: Right Groin Exploration  ;  Surgeon: Rosetta Posner, MD;  Location: [facility];  Service: Vascular;  Laterality: Right;  . HIP PINNING,CANNULATED Right 03/21/2018  . HIP PINNING,CANNULATED Right 03/21/2018   Procedure: CANNULATED HIP PINNING;  Surgeon: Rod Can, MD;  Location: [facility];  Service: Orthopedics;  Laterality: Right;  . LUMBAR LAMINECTOMY  2000   Dr Vertell Limber  . SHOULDER SURGERY     R shoulder  . TONSILLECTOMY      There were no vitals filed for this visit.  Subjective Assessment - 09/24/19 1021    Subjective  Daughter in law was able to adjust rollator at home.  Hasn't been able to do exercises or a lot of walking this past week and weekend due to gout in great toe.  Is on a steroid taper for the gout.  Feeling better today - has two days left on steroids.    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  Yes                       [facility] Adult PT Treatment/Exercise - 09/24/19 1222      Transfers   Transfers  Sit to Stand;Stand to Sit    Sit to Stand  5: Supervision    Stand to Sit  5: Supervision      Ambulation/Gait   Ambulation/Gait  Yes    Ambulation/Gait Assistance  4: Min assist    Ambulation/Gait Assistance Details  with rollator around gym in moderately crowded environment; pt maintains good step and stride length and no LOB; therapist guiding rollator only    Ambulation Distance (Feet)  115 Feet    Assistive device  Rollator     Gait Pattern  Step-through pattern    Ambulation Surface  Level;Indoor    Curb  Other (comment)    Curb Details (indicate cue type and reason)  pt asking if it is possible to use a rollator on curb; verbalized sequence to patient, will practice next session          Balance Exercises - 09/24/19 1219      Balance Exercises: Standing   Rockerboard  Anterior/posterior;Lateral;Head turns;10 reps;UE support;Intermittent UE support    Other Standing Exercises  For rockerboard performed weight shifting actively anterior/posterior x 12 reps and then laterally x 12 with bilat UE support and min A for balance.  For each direction of rockerboard performed 10 reps head nods and head turns with bilat UE support but relaxed UE support and min A.  Also performed balance reaction training on rockerboard in each direction with single arm lifts x 10 reps and then bringing hand to back of the head x 10 reps with min-mod A for LOB to L and verbal cues for self correction of LOB as pt attempts to grab bars immediately        PT Education - 09/24/19 1223    Education Details  rollator sequence for curb - verbalized sequence, will practice next session    Person(s) Educated  Patient    Methods  Explanation    Comprehension  Verbalized understanding       PT Short Term Goals - 09/10/19 1040      PT SHORT TERM GOAL #1   Title  Pt will demonstrate ability to perform ongoing HEP with supervision    Time  4    Period  Weeks    Status  Achieved    Target Date  09/06/19      PT SHORT TERM GOAL #2   Title  Pt will ambulate x 230' over indoor surfaces with LRAD and min A with no significant LOB    Time  4    Period  Weeks    Status  Achieved    Target Date  09/06/19      PT SHORT TERM GOAL #3   Title  Pt will increase BERG score by 4 points    Baseline  32/56    Time  4    Period  Weeks    Status  Achieved    Target Date  09/06/19      PT SHORT TERM GOAL #4   Title  Pt will demonstrate ability to  stand for 2  minutes without use of UE for support but performing dynamic activity with UE with supervision to return to fixing hair in standing    Time  4    Period  Weeks    Status  Achieved    Target Date  09/06/19        PT Long Term Goals - 08/07/19 1638      PT LONG TERM GOAL #1   Title  Pt will demonstrate ability to perform final HEP with supervision    Baseline  does part of HEP with husband supervision, other part of HEP with daughter in law    Time  8    Period  Weeks    Status  Revised    Target Date  10/06/19      PT LONG TERM GOAL #2   Title  Pt will demonstrate negative positional testing for multiple canals    Time  8    Period  Weeks    Status  Revised    Target Date  10/06/19      PT LONG TERM GOAL #3   Title  Pt will demonstrate ability to perform sit <> stand and stand pivot transfers to L and R with LRAD and supervision with no LOB    Baseline  Min HHA with white cane    Time  8    Period  Weeks    Status  Revised    Target Date  10/06/19      PT LONG TERM GOAL #4   Title  Pt will demonstrate ability to ambulate 200' outside over pavement and negotiate curb with LRAD and min A    Baseline  min-mod A indoors with cane/white cane 150'; curb with mod-max A    Time  8    Period  Weeks    Status  Revised    Target Date  10/06/19      PT LONG TERM GOAL #5   Title  Pt will decrease falls risk as indicated by increase in BERG to >/= 34/56    Baseline  slight decrease to 28/56    Time  8    Period  Weeks    Status  Revised    Target Date  10/06/19            Plan - 09/24/19 1225    Clinical Impression Statement  Pt reporting ongoing pain in great toe but able to participate in balance training on rockerboard focusing on weight shifting and balance reactions anterior/posterior and laterally; pt had greater difficulty shifting weight to R side and was only able to release one UE from bars for 1 second at a time.  Will continue to address and progress  towards LTG.    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, IBS, HTN, HLD, femoral artery thrombosis, diverticulosis, cataract, OA, R shoulder surgery, lumbar laminectomy, R cannulated hip surgery, and embolectomy    Examination-Activity Limitations  Bed Mobility;Locomotion Level;Stand;Transfers;Stairs    Examination-Participation Restrictions  Shop;Community Activity    Rehab Potential  Good    PT Frequency  1x / week    PT Duration  8 weeks    PT Treatment/Interventions  ADLs/Self Care Home Management;Canalith Repostioning;Gait training;Stair training;Functional mobility training;Therapeutic activities;Therapeutic exercise;Balance training;Neuromuscular re-education;Patient/family education;Vestibular    PT Next Visit Plan  PT IS COMPLETELY VISUALLY IMPAIRED -  USES WHITE CANE.  When recert on 0000000- do aquatic therapy for a few visits?  Fill out referral.  Gait training with rollator on curb- outside, inclines, curb.  If +2 work on stepping strategy with perturbations.  Standing on rockerboard - UE lifts, head/trunk righting, head tilts.  Step ups in // bars, sit <> stand with decreased UE support, corner balance with decreased UE support - dynamic UE movement to simulate fixing hair, wall bumps.    Consulted and Agree with Plan of Care  Patient;Family member/caregiver    Family Member Consulted  Husband       Patient will benefit from skilled therapeutic intervention in order to improve the following deficits and impairments:  Decreased balance, Difficulty walking, Dizziness  Visit Diagnosis: Repeated falls  Unsteadiness on feet  Difficulty in walking, not elsewhere classified     Problem List Patient Active Problem List   Diagnosis Date Noted  . Acute gout 09/21/2019  . Vertigo of central origin  02/09/2019  . Other peripheral vertigo, unspecified ear 02/09/2019  . Nonintractable headache 02/09/2019  . Pruritus 01/02/2019  . Hypokalemia 08/09/2018  . Long term (current) use of anticoagulants 08/01/2018  . Pre-operative clearance 08/01/2018  . Closed compression fracture of L4 vertebra ([facility]) 07/11/2018  . Bilateral leg edema 07/11/2018  . Lung nodule < 6cm on CT 07/11/2018  . Hypothyroidism (acquired) 04/11/2018  . Fracture of femoral neck, right ([facility]) 03/22/2018  . PAF (paroxysmal atrial fibrillation) ([facility]) 03/18/2018  . 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 Herpes zoster without complication [id number]  . Jaw claudication 07/27/2017  . Temporal arteritis ([facility]) 07/18/2017  . Vision, loss, sudden, bilateral 07/18/2017  . 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 Osteoarthritis 06/25/2016  . Anxiety 06/25/2016  . Back pain 07/18/2015  . 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 Carotid bruit present 06/05/2010  . Osteoporosis 06/03/2008  . History of colonic polyps 06/03/2008  . INTENTION TREMOR 12/06/2007  . IBS 10/16/2007  . Hyperlipidemia 06/06/2007  . Essential hypertension 06/06/2007  . Chronic obstructive airway disease with asthma ([facility]) 02/23/2007    Rico Junker, PT, DPT 09/24/19    12:28 PM    [facility] [address] [place], [place], [address] Phone: [phone]   Fax:  [phone]  Name: Crystal [name] MRN: [id number] Date of Birth: [birth year]

## 2019-09-26 ENCOUNTER — Institutional Professional Consult (permissible substitution): Payer: Medicare Other | Admitting: Internal Medicine

## 2019-10-01 ENCOUNTER — Other Ambulatory Visit: Payer: Self-pay

## 2019-10-01 ENCOUNTER — Other Ambulatory Visit: Payer: Self-pay | Admitting: Internal Medicine

## 2019-10-01 ENCOUNTER — Ambulatory Visit: Payer: Medicare Other | Admitting: Physical Therapy

## 2019-10-01 DIAGNOSIS — R42 Dizziness and giddiness: Secondary | ICD-10-CM

## 2019-10-01 DIAGNOSIS — H8112 Benign paroxysmal vertigo, left ear: Secondary | ICD-10-CM | POA: Diagnosis not present

## 2019-10-01 DIAGNOSIS — H8111 Benign paroxysmal vertigo, right ear: Secondary | ICD-10-CM | POA: Diagnosis not present

## 2019-10-01 DIAGNOSIS — R2681 Unsteadiness on feet: Secondary | ICD-10-CM

## 2019-10-01 DIAGNOSIS — R262 Difficulty in walking, not elsewhere classified: Secondary | ICD-10-CM | POA: Diagnosis not present

## 2019-10-01 DIAGNOSIS — R296 Repeated falls: Secondary | ICD-10-CM | POA: Diagnosis not present

## 2019-10-02 NOTE — Therapy (Signed)
[facility] [address] [place], [place], [address] Phone: [phone]   Fax:  [phone]  Physical Therapy Treatment  Patient Details  Name: Crystal [name] MRN: [id number] Date of Birth: 1945/07/12 Referring Provider (PT): [name], MD   Encounter Date: 10/01/2019  PT End of Session - 10/02/19 1352    Visit Number  25    Number of Visits  27    Date for PT Re-Evaluation  10/06/19    Authorization Type  [employer] Medicare; $40 copay.  10th visit PN.  No VL    Progress Note Due on Visit  29    PT Start Time  1015    PT Stop Time  1100    PT Time Calculation (min)  45 min    Equipment Utilized During Treatment  Other (comment);Gait belt   rollator   Activity Tolerance  Patient tolerated treatment well    Behavior During Therapy  WFL for tasks assessed/performed       Past Medical History:  Diagnosis Date  . Adenomatous colon polyp 2007 & 2010    Dr [name]  . Allergy    seasonal  . Arthritis   . Cataract   . Diverticulosis   . Femoral artery thrombosis, right ([facility]) 09/01/2017  . Hyperlipidemia   . Hypertension   . IBS (irritable bowel syndrome)   . Legally blind   . MVP (mitral valve prolapse)   . Paroxysmal atrial fibrillation with rapid ventricular response ([facility]) 08/29/2017  . Peripheral vascular disease ([facility])   . RAD (reactive airway disease)     Past Surgical History:  Procedure Laterality Date  . ABDOMINAL HYSTERECTOMY     with USO for dysfunctionall menses  . APPENDECTOMY    . ARTERY BIOPSY Bilateral 07/21/2017   Procedure: BIOPSY BILATERAL TEMPORAL ARTERY;  Surgeon: [name], MD;  Location: [facility];  Service: Vascular;  Laterality: Bilateral;  . CARDIOVERSION N/A 06/16/2018   Procedure: CARDIOVERSION;  Surgeon: [name], MD;  Location: [facility];  Service: Cardiovascular;  Laterality: N/A;  . CATARACT EXTRACTION W/ INTRAOCULAR LENS IMPLANT  2013   bilateral; Dr [name]  .  COLONOSCOPY  2013   negative , due 2018; Dr [name]  . COLONOSCOPY W/ POLYPECTOMY      X2 ; diverticulosis  . EMBOLECTOMY Right 08/29/2017   Procedure: Right Groin Exploration  ;  Surgeon: Rosetta Posner, MD;  Location: [facility];  Service: Vascular;  Laterality: Right;  . HIP PINNING,CANNULATED Right 03/21/2018  . HIP PINNING,CANNULATED Right 03/21/2018   Procedure: CANNULATED HIP PINNING;  Surgeon: Rod Can, MD;  Location: [facility];  Service: Orthopedics;  Laterality: Right;  . LUMBAR LAMINECTOMY  2000   Dr Vertell Limber  . SHOULDER SURGERY     R shoulder  . TONSILLECTOMY      There were no vitals filed for this visit.  Subjective Assessment - 10/01/19 1025    Subjective  Had a good weekend - gout is better and has finished medication.    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  No/denies  [facility] Adult PT Treatment/Exercise - 10/01/19 1028      Transfers   Transfers  Sit to Stand;Stand to Sit    Sit to Stand  5: Supervision    Transfer Cueing  verbal cues to locate RW handles      Ambulation/Gait   Ambulation/Gait  Yes    Ambulation/Gait Assistance  4: Min guard    Ambulation/Gait Assistance Details  with rollator; only assistance needed to guide rollator around environment    Ambulation Distance (Feet)  230 Feet    Assistive device  Rollator    Ambulation Surface  Level;Indoor    Curb  4: Min assist;3: Mod assist    Curb Details (indicate cue type and reason)  performed inside on practice curb - performed with rollator x 2 reps with therapist providing total  verbal cues for sequencing (step by step) during first repetition.  During second repetition had pt verbalize sequence back to therapist.  Cued pt to remember, "4 wheels, lock brakes, step." for when she practices with friend or daughter in law.  Pt required mod A for first repetition for management of rollator and for balance.  Second repetition pt required min A for walker management and balance.  Will continue to practice      Therapeutic Activites    Therapeutic Activities  Other Therapeutic Activities    Other Therapeutic Activities  discussed renewal of certification and addition of more visits.  Will refer to aquatic therapy.            Balance Exercises - 10/02/19 1352      Balance Exercises: Standing   Rockerboard  Anterior/posterior;10 reps;Intermittent UE support;EC   Pt is blind   Other Standing Exercises  For rockerboard ant/post performed weight shifting anterior/posterior x 10 reps and then transitioned to maintaining balance and use of balance reactions while lifting one UE to touch head and hold x 5 seconds.  Progressed to lifting both UE off bars x 10 reps with pt initially lifting UE for less than 1 second.  Performed again x 10 reps with pt holding x 5 seconds.  Therapist maintained min A and provided cues for postural control/weight shifting        PT Education - 10/02/19 1351    Education Details  curb negotiation with rollator    Person(s) Educated  Patient    Methods  Explanation;Tactile cues;Verbal cues    Comprehension  Need further instruction       PT Short Term Goals - 09/10/19 1040      PT SHORT TERM GOAL #1   Title  Pt will demonstrate ability to perform ongoing HEP with supervision    Time  4    Period  Weeks    Status  Achieved    Target Date  09/06/19      PT SHORT TERM GOAL #2   Title  Pt will ambulate x 230' over indoor surfaces with LRAD and min A with no significant LOB    Time  4    Period  Weeks    Status  Achieved    Target Date   09/06/19      PT SHORT TERM GOAL #3   Title  Pt will increase BERG score by 4 points    Baseline  32/56    Time  4    Period  Weeks    Status  Achieved    Target Date  09/06/19      PT SHORT TERM GOAL #4  Title  Pt will demonstrate ability to stand for 2 minutes without use of UE for support but performing dynamic activity with UE with supervision to return to fixing hair in standing    Time  4    Period  Weeks    Status  Achieved    Target Date  09/06/19        PT Long Term Goals - 08/07/19 1638      PT LONG TERM GOAL #1   Title  Pt will demonstrate ability to perform final HEP with supervision    Baseline  does part of HEP with husband supervision, other part of HEP with daughter in law    Time  8    Period  Weeks    Status  Revised    Target Date  10/06/19      PT LONG TERM GOAL #2   Title  Pt will demonstrate negative positional testing for multiple canals    Time  8    Period  Weeks    Status  Revised    Target Date  10/06/19      PT LONG TERM GOAL #3   Title  Pt will demonstrate ability to perform sit <> stand and stand pivot transfers to L and R with LRAD and supervision with no LOB    Baseline  Min HHA with white cane    Time  8    Period  Weeks    Status  Revised    Target Date  10/06/19      PT LONG TERM GOAL #4   Title  Pt will demonstrate ability to ambulate 200' outside over pavement and negotiate curb with LRAD and min A    Baseline  min-mod A indoors with cane/white cane 150'; curb with mod-max A    Time  8    Period  Weeks    Status  Revised    Target Date  10/06/19      PT LONG TERM GOAL #5   Title  Pt will decrease falls risk as indicated by increase in BERG to >/= 34/56    Baseline  slight decrease to 28/56    Time  8    Period  Weeks    Status  Revised    Target Date  10/06/19            Plan - 10/02/19 1354    Clinical Impression Statement  Initiated curb negotiation training with rollator with pt verbalizing sequence on  second attempt but continues to require training due to errors with sequencing.  Continued weight shifting and balance reaction training on rockerboard with pt demonstrating significant improvement today - pt able to release with bilat UE and maintain balance x 5 seconds with therapist maintaining min A.  Will continue to progress towards LTG.    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, IBS, HTN, HLD, femoral artery thrombosis, diverticulosis, cataract, OA, R shoulder surgery, lumbar laminectomy, R cannulated hip surgery, and embolectomy    Examination-Activity Limitations  Bed Mobility;Locomotion Level;Stand;Transfers;Stairs    Examination-Participation Restrictions  Shop;Community Activity    Rehab Potential  Good    PT Frequency  1x / week    PT Duration  8 weeks    PT Treatment/Interventions  ADLs/Self Care Home Management;Canalith Repostioning;Gait training;Stair training;Functional mobility training;Therapeutic activities;Therapeutic exercise;Balance training;Neuromuscular  re-education;Patient/family education;Vestibular    PT Next Visit Plan  PT IS COMPLETELY VISUALLY IMPAIRED - USES WHITE CANE.  CHECK LTG and RECERT - Fill out aquatic referral.  Gait training with rollator on curb- outside, inclines, curb.  If +2 work on stepping strategy with perturbations.  Standing on rockerboard - UE lifts, head/trunk righting, head tilts.  Step ups in // bars, sit <> stand with decreased UE support, corner balance with decreased UE support - dynamic UE movement to simulate fixing hair, wall bumps.    Consulted and Agree with Plan of Care  Patient;Family member/caregiver    Family Member Consulted  Husband       Patient will benefit from skilled therapeutic intervention in order to improve the following deficits and  impairments:  Decreased balance, Difficulty walking, Dizziness  Visit Diagnosis: Repeated falls  Unsteadiness on feet  Difficulty in walking, not elsewhere classified  Dizziness and giddiness     Problem List Patient Active Problem List   Diagnosis Date Noted  . Acute gout 09/21/2019  . Vertigo of central origin 02/09/2019  . Other peripheral vertigo, unspecified ear 02/09/2019  . Nonintractable headache 02/09/2019  . Pruritus 01/02/2019  . Hypokalemia 08/09/2018  . Long term (current) use of anticoagulants 08/01/2018  . Pre-operative clearance 08/01/2018  . Closed compression fracture of L4 vertebra ([facility]) 07/11/2018  . Bilateral leg edema 07/11/2018  . Lung nodule < 6cm on CT 07/11/2018  . Hypothyroidism (acquired) 04/11/2018  . Fracture of femoral neck, right ([facility]) 03/22/2018  . PAF (paroxysmal atrial fibrillation) ([facility]) 03/18/2018  . Femoral artery thrombosis, right ([facility]) 09/01/2017  . Herpes zoster without complication [id number]  . Jaw claudication 07/27/2017  . Temporal arteritis ([facility]) 07/18/2017  . Vision, loss, sudden, bilateral 07/18/2017  . 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 Osteoarthritis 06/25/2016  . Anxiety 06/25/2016  . Back pain 07/18/2015  . 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 Carotid bruit present 06/05/2010  . Osteoporosis 06/03/2008  . History of colonic polyps 06/03/2008  . INTENTION TREMOR 12/06/2007  . IBS 10/16/2007  . Hyperlipidemia 06/06/2007  . Essential hypertension 06/06/2007  . Chronic obstructive airway disease with asthma ([facility]) 02/23/2007    Rico Junker, PT, DPT 10/02/19    1:58 PM    [facility] [address] [place], [place], [address] Phone: [phone]   Fax:  [phone]  Name: JAHLIA READUS MRN: [id number] Date of Birth: 04-11-1946

## 2019-10-06 ENCOUNTER — Telehealth: Payer: Self-pay | Admitting: Physical Therapy

## 2019-10-06 NOTE — Telephone Encounter (Signed)
Hello Dr. Stanford Breed, We would like to add aquatic physical therapy to Crystal [name] plan of care but wanted to obtain medical clearance.  Do you feel there would be any precautions or contraindications for Crystal [name] to participate in aquatic therapy in 3-4 feet of water in light of her Afib and other cardiac conditions?  Thank you, Rico Junker, PT, DPT 10/06/19    5:11 PM

## 2019-10-08 NOTE — Telephone Encounter (Signed)
Okay to participate Crystal [name]

## 2019-10-12 ENCOUNTER — Ambulatory Visit: Payer: Medicare Other | Admitting: Physical Therapy

## 2019-10-12 ENCOUNTER — Encounter: Payer: Self-pay | Admitting: Physical Therapy

## 2019-10-12 ENCOUNTER — Other Ambulatory Visit: Payer: Self-pay

## 2019-10-12 DIAGNOSIS — H8112 Benign paroxysmal vertigo, left ear: Secondary | ICD-10-CM

## 2019-10-12 DIAGNOSIS — R2681 Unsteadiness on feet: Secondary | ICD-10-CM | POA: Diagnosis not present

## 2019-10-12 DIAGNOSIS — R296 Repeated falls: Secondary | ICD-10-CM | POA: Diagnosis not present

## 2019-10-12 DIAGNOSIS — R42 Dizziness and giddiness: Secondary | ICD-10-CM

## 2019-10-12 DIAGNOSIS — H8111 Benign paroxysmal vertigo, right ear: Secondary | ICD-10-CM | POA: Diagnosis not present

## 2019-10-12 DIAGNOSIS — R262 Difficulty in walking, not elsewhere classified: Secondary | ICD-10-CM

## 2019-10-12 NOTE — Therapy (Signed)
[facility] [address] [place], [place], [address] Phone: [phone]   Fax:  [phone]  Physical Therapy Treatment  Patient Details  Name: Crystal [name] MRN: [id number] Date of Birth: Nov 02, 1945 Referring Provider (PT): [name], MD   Encounter Date: 10/12/2019  PT End of Session - 10/12/19 1545    Visit Number  26    Number of Visits  34    Date for PT Re-Evaluation  12/11/19    Authorization Type  [employer] Medicare; $40 copay.  10th visit PN.  No VL    Progress Note Due on Visit  29    PT Start Time  0849    PT Stop Time  0937    PT Time Calculation (min)  48 min    Equipment Utilized During Treatment  Other (comment);Gait belt   rollator   Activity Tolerance  Patient tolerated treatment well    Behavior During Therapy  WFL for tasks assessed/performed       Past Medical History:  Diagnosis Date  . Adenomatous colon polyp 2007 & 2010    Dr [name]  . Allergy    seasonal  . Arthritis   . Cataract   . Diverticulosis   . 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 Hyperlipidemia   . Hypertension   . IBS (irritable bowel syndrome)   . Legally blind   . MVP (mitral valve prolapse)   . Paroxysmal atrial fibrillation with rapid ventricular response ([place]) 08/29/2017  . Peripheral vascular disease ([place])   . RAD (reactive airway disease)     Past Surgical History:  Procedure Laterality Date  . ABDOMINAL HYSTERECTOMY     with USO for dysfunctionall menses  . APPENDECTOMY    . ARTERY BIOPSY Bilateral 07/21/2017   Procedure: BIOPSY BILATERAL TEMPORAL ARTERY;  Surgeon: [name], MD;  Location: [facility];  Service: Vascular;  Laterality: Bilateral;  . CARDIOVERSION N/A 06/16/2018   Procedure: CARDIOVERSION;  Surgeon: [name], MD;  Location: [facility];  Service: Cardiovascular;  Laterality: N/A;  . CATARACT EXTRACTION W/ INTRAOCULAR LENS IMPLANT  2013   bilateral; Dr [name]  .  COLONOSCOPY  2013   negative , due 2018; Dr [name]  . COLONOSCOPY W/ POLYPECTOMY      X2 ; diverticulosis  . EMBOLECTOMY Right 08/29/2017   Procedure: Right Groin Exploration  ;  Surgeon: Rosetta Posner, MD;  Location: [facility];  Service: Vascular;  Laterality: Right;  . HIP PINNING,CANNULATED Right 03/21/2018  . HIP PINNING,CANNULATED Right 03/21/2018   Procedure: CANNULATED HIP PINNING;  Surgeon: Rod Can, MD;  Location: [facility];  Service: Orthopedics;  Laterality: Right;  . LUMBAR LAMINECTOMY  2000   Dr Vertell Limber  . SHOULDER SURGERY     R shoulder  . TONSILLECTOMY      There were no vitals filed for this visit.  Subjective Assessment - 10/12/19 0855    Subjective  Pollen is affecting her significantly; had one episode of dizziness with rolling last week but none since.    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, IBS, HTN, HLD, femoral artery thrombosis, diverticulosis, cataract, OA, R shoulder surgery, lumbar laminectomy, R cannulated hip surgery, and embolectomy      Prior Function   Level of Independence  Independent with household mobility with device;Independent with basic  ADLs;Requires assistive device for independence;Independent with transfers      Observation/Other Assessments   Focus on Therapeutic Outcomes (FOTO)   N/A      Transfers   Transfers  Sit to Stand;Stand to Sit    Sit to Stand  5: Supervision    Stand to Sit  5: Supervision      Ambulation/Gait   Ambulation/Gait  Yes    Ambulation/Gait Assistance  4: Min assist    Ambulation/Gait Assistance Details  with rollator indoors and outdoors with min A to guide rollator    Ambulation Distance (Feet)  250 Feet    Assistive device  Rollator    Gait Pattern  Step-through pattern;Decreased step length - right;Decreased step length - left;Poor foot clearance - left;Poor foot clearance - right;Shuffle    Ambulation Surface  Level;Unlevel;Indoor;Outdoor;Paved    Curb  3: Mod assist;4: Min assist    Curb Details (indicate cue type and reason)  outside on actual curb x 2 reps with therapist providing mod A initially due to pt requiring total cues for sequencing and assistance to maintain balance when lowering or lifting rollator; second attempt pt only required min A to perform safely.  Pt has difficulty recalling sequence with rollator and very fearful of falling with descending curb      Berg Balance Test   Sit to Stand  Able to stand  independently using hands    Standing Unsupported  Able to stand 2 minutes with supervision    Sitting with Back Unsupported but Feet Supported on Floor or Stool  Able to sit safely and securely 2 minutes    Stand to Sit  Controls descent by using hands    Transfers  Able to transfer safely, definite need of hands    Standing Unsupported with Eyes Closed  Able to stand 10 seconds with supervision    Standing Unsupported with Feet Together  Able to place feet together independently and stand for 1 minute with supervision    From Standing, Reach Forward with Outstretched Arm  Can reach confidently >25 cm (10")    From Standing Position, Pick up Object from [place] to  pick up shoe, needs supervision    From Standing Position, Turn to Look Behind Over each Shoulder  Looks behind one side only/other side shows less weight shift    Turn 360 Degrees  Needs assistance while turning    Standing Unsupported, Alternately Place Feet on Step/Stool  Able to complete >2 steps/needs minimal assist    Standing Unsupported, One Foot in Front  Needs help to step but can hold 15 seconds    Standing on One Leg  Unable to try or needs assist to prevent fall  Total Score  34    Berg comment:  34/56                           PT Education - 10/12/19 1544    Education Details  progress towards goals and curb negotiation outside with rollator    Person(s) Educated  Patient    Methods  Explanation;Demonstration    Comprehension  Need further instruction       PT Short Term Goals - 09/10/19 1040      PT SHORT TERM GOAL #1   Title  Pt will demonstrate ability to perform ongoing HEP with supervision    Time  4    Period  Weeks    Status  Achieved    Target Date  09/06/19      PT SHORT TERM GOAL #2   Title  Pt will ambulate x 230' over indoor surfaces with LRAD and min A with no significant LOB    Time  4    Period  Weeks    Status  Achieved    Target Date  09/06/19      PT SHORT TERM GOAL #3   Title  Pt will increase BERG score by 4 points    Baseline  32/56    Time  4    Period  Weeks    Status  Achieved    Target Date  09/06/19      PT SHORT TERM GOAL #4   Title  Pt will demonstrate ability to stand for 2 minutes without use of UE for support but performing dynamic activity with UE with supervision to return to fixing hair in standing    Time  4    Period  Weeks    Status  Achieved    Target Date  09/06/19        PT Long Term Goals - 10/12/19 1546      PT LONG TERM GOAL #1   Title  Pt will demonstrate ability to perform final HEP with supervision    Time  8    Period  Weeks    Status  Achieved      PT LONG TERM GOAL #2    Title  Pt will demonstrate negative positional testing for multiple canals    Time  8    Period  Weeks    Status  On-going      PT LONG TERM GOAL #3   Title  Pt will demonstrate ability to perform sit <> stand and stand pivot transfers to L and R with LRAD and supervision with no LOB    Baseline  Min A without AD; supervision with rollator    Time  8    Period  Weeks    Status  Achieved      PT LONG TERM GOAL #4   Title  Pt will demonstrate ability to ambulate 200' outside over pavement and negotiate curb with LRAD and min A    Baseline  Min A to ambulate 250', mod A for curb    Time  8    Period  Weeks    Status  Partially Met      PT LONG TERM GOAL #5   Title  Pt will decrease falls risk as indicated by increase in BERG to >/= 34/56    Baseline  34/56    Time  8    Period  Weeks    Status  Achieved       New goals for recertification: PT Short Term Goals - 10/12/19 1553      PT SHORT TERM GOAL #1   Title  Pt will demonstrate ability to perform ongoing HEP with supervision    Time  4    Period  Weeks    Status  Revised    Target Date  11/11/19      PT SHORT TERM GOAL #2   Title  Pt will ambulate up/down ramp and curb with rollator with consistent min A    Time  4    Period  Weeks    Status  Revised    Target Date  11/11/19      PT SHORT TERM GOAL #3   Title  Pt will increase BERG score to >/= 38/56    Baseline  34/56    Time  4    Period  Weeks    Status  Revised    Target Date  11/11/19      PT SHORT TERM GOAL #4   Title  Pt will initiate aquatic therapy for balance and gait training    Time  4    Period  Weeks    Status  Revised    Target Date  11/11/19      PT Long Term Goals - 10/12/19 1557      PT LONG TERM GOAL #1   Title  Pt will demonstrate ability to perform final HEP with supervision    Time  8    Period  Weeks    Status  Revised    Target Date  12/11/19      PT LONG TERM GOAL #2   Title  Pt will demonstrate negative positional testing  for multiple canals    Time  8    Period  Weeks    Status  Revised    Target Date  12/11/19      PT LONG TERM GOAL #3   Title  Pt will demonstrate ability to stand ~5 minutes without UE support and without leaning against furniture with supervision for pt to return to fixing hair at sink safely    Baseline  leans against counter top    Time  8    Period  Weeks    Status  New    Target Date  12/11/19      PT LONG TERM GOAL #4   Title  Pt will demonstrate ability to ambulate 500' outside over pavement and negotiate curb with LRAD and min A    Baseline  Min A to ambulate 250', mod A for curb    Time  8    Period  Weeks    Status  Revised    Target Date  12/11/19      PT LONG TERM GOAL #5   Title  Pt will decrease falls risk as indicated by increase in BERG to >/= 40/56    Time  8    Period  Weeks    Status  Revised    Target Date  12/11/19           Plan - 10/12/19 1547    Clinical Impression Statement  Pt is making steady progress and has met 3/5 LTG, partially meeting one goal and one goal is ongoing.  Pt continues to report no return of vertigo (not formally assessed today) and demonstrates improved balance with static standing with decreased UE support.  Continues to require UE  support for dynamic standing balance and movements.  Pt demonstrates safer gait sequence and decreased assistance when ambulating with rollator; will benefit from further training in community settings.  Pt will benefit from continued skilled PT services with the addition of aquatic therapy to address balance impairments and further decrease falls risk.    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, IBS, HTN, HLD, femoral artery thrombosis, diverticulosis, cataract, OA, R shoulder surgery, lumbar laminectomy, R cannulated  hip surgery, and embolectomy    Examination-Activity Limitations  Bed Mobility;Locomotion Level;Stand;Transfers;Stairs    Examination-Participation Restrictions  Shop;Community Activity    Rehab Potential  Good    PT Frequency  1x / week    PT Duration  8 weeks    PT Treatment/Interventions  ADLs/Self Care Home Management;Canalith Repostioning;Gait training;Stair training;Functional mobility training;Therapeutic activities;Therapeutic exercise;Balance training;Neuromuscular re-education;Patient/family education;Vestibular;Aquatic Therapy;DME Instruction    PT Next Visit Plan  PT IS COMPLETELY VISUALLY IMPAIRED - USES WHITE CANE.  Update HEP based on progress/BERG.  Gait training with rollator on curb- outside, inclines, curb.  If +2 work on stepping strategy with perturbations.  Standing on rockerboard - UE lifts, head/trunk righting, head tilts.  Step ups in // bars, sit <> stand with decreased UE support, corner balance with decreased UE support - dynamic UE movement to simulate fixing hair, wall bumps.    Consulted and Agree with Plan of Care  Patient;Family member/caregiver    Family Member Consulted  Husband       Patient will benefit from skilled therapeutic intervention in order to improve the following deficits and impairments:  Decreased balance, Difficulty walking, Dizziness  Visit Diagnosis: Repeated falls  Unsteadiness on feet  Difficulty in walking, not elsewhere classified  Dizziness and giddiness  BPPV (benign paroxysmal positional vertigo), left  BPPV (benign paroxysmal positional vertigo), right     Problem List Patient Active Problem List   Diagnosis Date Noted  . Acute gout 09/21/2019  . Vertigo of central origin 02/09/2019  . Other peripheral vertigo, unspecified ear 02/09/2019  . Nonintractable headache 02/09/2019  . Pruritus 01/02/2019  . Hypokalemia 08/09/2018  . Long term (current) use of anticoagulants 08/01/2018  . Pre-operative clearance 08/01/2018   . Closed compression fracture of L4 vertebra ([place]) 07/11/2018  . Bilateral leg edema 07/11/2018  . Lung nodule < 6cm on CT 07/11/2018  . Hypothyroidism (acquired) 04/11/2018  . Fracture of femoral neck, right ([place]) 03/22/2018  . PAF (paroxysmal atrial fibrillation) ([place]) 03/18/2018  . 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 Herpes zoster without complication 12/12/1599  . Jaw claudication 07/27/2017  . Temporal arteritis ([place]) 07/18/2017  . Vision, loss, sudden, bilateral 07/18/2017  . 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 Osteoarthritis 06/25/2016  . Anxiety 06/25/2016  . Back pain 07/18/2015  . 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 Carotid bruit present 06/05/2010  . Osteoporosis 06/03/2008  . History of colonic polyps 06/03/2008  . INTENTION TREMOR 12/06/2007  . IBS 10/16/2007  . Hyperlipidemia 06/06/2007  . Essential hypertension 06/06/2007  . Chronic obstructive airway disease with asthma ([place]) 02/23/2007    Rico Junker, PT, DPT 10/12/19    3:53 PM    [facility] [address] [place], [place], [address] Phone: [phone]   Fax:  [phone]  Name: Crystal [name] MRN: [id number] Date of Birth: [birth year]

## 2019-10-15 ENCOUNTER — Ambulatory Visit: Payer: Medicare Other | Admitting: Physical Therapy

## 2019-10-15 ENCOUNTER — Telehealth: Payer: Self-pay | Admitting: *Deleted

## 2019-10-15 NOTE — Telephone Encounter (Signed)
Left message for pt husband to call, while pre-charting for appointment next week, dr Stanford Breed reviewed the CT the patient had done recently and he would like the patient to stop taking amiodarone due to potential for it to cause lung issues.

## 2019-10-15 NOTE — Telephone Encounter (Signed)
Spoke with pt husband, Aware of dr crenshaw's recommendations.  

## 2019-10-15 NOTE — Progress Notes (Signed)
HPI: FU atrial fibrillation. 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 Patient had a right buttock subcutaneous hematoma September 2019. Patient fell October 2019 and had a subcapital fracture of the right hip. She was in atrial fibrillation with rapid ventricular response at that time. She underwent repair of her hip fracture and was placed on amiodarone.Had cardioversion June 16, 2018.Carotid Dopplers March 2020 showed no stenosis on the right and 1 to 39% stenosis on the left. Seen for recurrent atrial fibrillation 1/21 and started bach on amiodarone. Chest CT February 2021 showed stable appearance of 6 mm right middle lobe nodule, increasing subpleural reticular opacities suggestive of UIP and coronary calcification.  Since last seen, she has dyspnea with more vigorous activities and is now on oxygen.  She has chronic pedal edema.  No chest pain, palpitations or syncope.  She has not fallen.  She denies bleeding.  She is only taking Eliquis once daily.  Current Outpatient Medications  Medication Sig Dispense Refill  . albuterol (VENTOLIN HFA) 108 (90 Base) MCG/ACT inhaler INHALE ONE PUFF BY MOUTH TWICE A DAY AS NEEDED 8.5 g 1  . alendronate (FOSAMAX) 70 MG tablet Take 70 mg by mouth every Monday.     Marland Kitchen amoxicillin (AMOXIL) 500 MG tablet Take 4 tablets by mouth as needed. Pt take 4 tablets prior dental  work    . Calcium Citrate-Vitamin D (CALCIUM + D PO) Take 1 tablet by mouth daily.    . Cholecalciferol (VITAMIN D3) 1000 units CAPS Take 1 capsule (1,000 Units total) by mouth daily with lunch. 60 capsule 0  . diclofenac Sodium (VOLTAREN) 1 % GEL Voltaren 1 % topical gel    . ELIQUIS 5 MG TABS tablet TAKE 1 TABLET BY MOUTH TWO TIMES A DAY 180 tablet 1  . furosemide (LASIX) 40 MG tablet TAKE ONE TABLET BY MOUTH DAILY 90 tablet 0  . gabapentin (NEURONTIN) 300 MG capsule Take 2 capsules (600 mg total) by mouth at bedtime. 180 capsule 1  . KLOR-CON M20 20 MEQ tablet TAKE TWO TABLETS BY MOUTH DAILY 180 tablet 0  . levothyroxine (SYNTHROID) 50 MCG tablet Take 1 tablet (50 mcg total) by mouth daily. 90 tablet 3  . LORazepam (ATIVAN) 1 MG tablet TAKE ONE TABLET BY MOUTH EVERY NIGHT AT BEDTIME 30 tablet 4  . methocarbamol (ROBAXIN) 500 MG tablet TAKE ONE TABLET BY MOUTH EVERY 6 HOURS AS NEEDED FOR MUSCLE SPASMS 120 tablet 1  . montelukast (SINGULAIR) 10 MG tablet TAKE ONE TABLET BY MOUTH EVERY NIGHT AT BEDTIME 90 tablet 0  . predniSONE (DELTASONE) 5 MG tablet Take 5 mg by mouth daily with breakfast.     . SYMBICORT 160-4.5 MCG/ACT inhaler INHALE ONE TO TWO PUFFS BY MOUTH EVERY 12 HOURS, THEN GARGLE AND SPIT AFTER USE 10.2 g 9  . theophylline (UNIPHYL) 400 MG 24 hr tablet TAKE 1/2 TABLET BY MOUTH TWO TIMES A DAY 90 tablet 0  . traMADol (ULTRAM) 50 MG tablet Take  1 tablet (50 mg total) by mouth every 6 (six) hours as needed for moderate pain. 90 tablet 0   No current facility-administered medications for this visit.     Past Medical History:  Diagnosis Date  . Adenomatous colon polyp 2007 & 2010    Dr [name]  . Allergy    seasonal  . Arthritis   . Asthma   . Cataract   . Diverticulosis   . 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 Hyperlipidemia   . Hypertension   . IBS (irritable bowel syndrome)   . Legally blind   . MVP (mitral valve prolapse)   . Paroxysmal atrial fibrillation  with rapid ventricular response ([place]) 08/29/2017  . Peripheral vascular disease ([place])   . RAD (reactive airway disease)     Past Surgical History:  Procedure Laterality Date  . ABDOMINAL HYSTERECTOMY     with USO for dysfunctionall menses  . APPENDECTOMY    . ARTERY BIOPSY Bilateral 07/21/2017   Procedure: BIOPSY BILATERAL TEMPORAL ARTERY;  Surgeon: [name], MD;  Location: [facility];  Service: Vascular;  Laterality: Bilateral;  . CARDIOVERSION N/A 06/16/2018   Procedure: CARDIOVERSION;  Surgeon: [name], MD;  Location: [facility];  Service: Cardiovascular;  Laterality: N/A;  . CATARACT EXTRACTION W/ INTRAOCULAR LENS IMPLANT  2013   bilateral; Dr [name]  . COLONOSCOPY  2013   negative , due 2018; Dr [name]  . COLONOSCOPY W/ POLYPECTOMY      X2 ; diverticulosis  . EMBOLECTOMY Right 08/29/2017   Procedure: Right Groin Exploration  ;  Surgeon: Rosetta Posner, MD;  Location: [facility];  Service: Vascular;  Laterality: Right;  . HIP PINNING,CANNULATED Right 03/21/2018  . HIP PINNING,CANNULATED Right 03/21/2018   Procedure: CANNULATED HIP PINNING;  Surgeon: Rod Can, MD;  Location: [facility];  Service: Orthopedics;  Laterality: Right;  . LUMBAR LAMINECTOMY  2000   Dr Vertell Limber  . SHOULDER SURGERY     R shoulder  . TONSILLECTOMY      Social History   Socioeconomic History  . Marital status: Married    Spouse name: Not on file  . Number of children: Not on file  . Years of education: Not on file  . Highest education level: Not on file  Occupational History  . Occupation: retired  Tobacco Use  . Smoking status: Former Smoker    Packs/day: 1.00    Years: 23.00    Pack years: 23.00    Quit date: 06/14/1989    Years since quitting: 30.3  . Smokeless tobacco: Never Used  . Tobacco comment: smoked 1961-1991 , up to < 1 ppd  Substance and Sexual Activity  . Alcohol use: Yes    Alcohol/week: 14.0 standard drinks    Types: 14 Glasses of wine per week    Comment: Wine  .  Drug use: No  . Sexual activity: Not on file  Other Topics Concern  . Not on file  Social History Narrative  . Not on file   Social Determinants of Health   Financial Resource Strain:   . Difficulty of Paying Living Expenses:   Food Insecurity:   . Worried About Charity fundraiser in the Last Year:   . Arboriculturist in the Last Year:   Transportation Needs:   . Film/video editor (Medical):   Marland Kitchen Lack of Transportation (Non-Medical):   Physical Activity:   . Days of Exercise per Week:   . Minutes of Exercise  per Session:   Stress:   . Feeling of Stress :   Social Connections:   . Frequency of Communication with Friends and Family:   . Frequency of Social Gatherings with Friends and Family:   . Attends Religious Services:   . Active Member of Clubs or Organizations:   . Attends Archivist Meetings:   Marland Kitchen Marital Status:   Intimate Partner Violence:   . Fear of Current or Ex-Partner:   . Emotionally Abused:   Marland Kitchen Physically Abused:   . Sexually Abused:     Family History  Problem Relation Age of Onset  . Heart attack Brother 47  . Parkinsonism Mother   . Stroke Mother   . Heart attack Father        pre 40  . Diabetes Sister   . COPD Sister        X 3  . Other Son        suicide 2016  . Colon cancer Neg Hx   . Cancer Neg Hx   . Esophageal cancer Neg Hx   . Liver cancer Neg Hx   . Pancreatic cancer Neg Hx   . Rectal cancer Neg Hx   . Stomach cancer Neg Hx     ROS: no fevers or chills, productive cough, hemoptysis, dysphasia, odynophagia, melena, hematochezia, dysuria, hematuria, rash, seizure activity, orthopnea, PND, pedal edema, claudication. Remaining systems are negative.  Physical Exam: Well-developed well-nourished in no acute distress.  Skin is warm and dry.  HEENT is normal. Blind Neck is supple.  Chest is clear to auscultation with normal expansion.  Cardiovascular exam is regular rate and rhythm.  Abdominal exam nontender or  distended. No masses palpated. Extremities show 1+ ankle edema. neuro grossly intact  A/P  1 paroxysmal atrial fibrillation-patient remains in sinus rhythm.  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  May need different antiarrhythmic in the future. Continue apixaban and Cardizem.  I will increase Cardizem 180 mg daily.  2 hypertension-blood pressure is elevated.  Increase Cardizem and adjust as needed.  3 pulmonary nodule-followed by primary care.  4 hyperlipidemia-continue statin.  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  Continue medical therapy.  6 history of giant cell arteritis  [name], MD

## 2019-10-22 ENCOUNTER — Encounter: Payer: Self-pay | Admitting: Internal Medicine

## 2019-10-22 ENCOUNTER — Ambulatory Visit: Payer: Medicare Other | Admitting: Internal Medicine

## 2019-10-22 ENCOUNTER — Other Ambulatory Visit: Payer: Self-pay

## 2019-10-22 VITALS — BP 130/60 | HR 87 | Temp 98.0°F | Ht 64.0 in | Wt 135.2 lb

## 2019-10-22 DIAGNOSIS — J849 Interstitial pulmonary disease, unspecified: Secondary | ICD-10-CM

## 2019-10-22 DIAGNOSIS — M316 Other giant cell arteritis: Secondary | ICD-10-CM

## 2019-10-22 DIAGNOSIS — R911 Solitary pulmonary nodule: Secondary | ICD-10-CM

## 2019-10-22 DIAGNOSIS — R0602 Shortness of breath: Secondary | ICD-10-CM | POA: Diagnosis not present

## 2019-10-22 DIAGNOSIS — J8410 Pulmonary fibrosis, unspecified: Secondary | ICD-10-CM | POA: Diagnosis not present

## 2019-10-22 DIAGNOSIS — J984 Other disorders of lung: Secondary | ICD-10-CM | POA: Diagnosis not present

## 2019-10-22 NOTE — Progress Notes (Signed)
Crystal PARROTTA    [id number]    1945-08-16  Primary Care Physician:Burns, Claudina Lick, MD  Referring Physician: [name], MD [place],  Crystal [name] [id number] Reason for Consultation: pulmonary nodule, fibrosis, asthma Date of Consultation: 10/22/2019  Chief complaint:   Chief Complaint  Patient presents with  . Consult    asthma, CT scan-spot on lung.     HPI: Crystal [name] is a 74 y.o. 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 Biopsy done of temporal artery in Feb 2019.  Here for shortness of breath. History of A. Fib. Has been on and off amiodarone over the past 2 years. Sees Dr. Stanford Breed. Amiodarone discontinued when she was more short of breath in the last few weeks in the setting of an abnormal CT Chest. Notes cough which is dry, fatigue and weakness. Arrived today on room air and had oxygen saturations in the 70s.   Was diagnosed with asthma in the 1970s.  She has a rescue inhaler, albuterol, uses 3-4 times/week.  Current Regimen: She takes theophylline, singulair and symbicort for asthma. Asthma Triggers: strong smells, perfumes, smokes, mold, pollen Exacerbations in the last year: none History of hospitalization or intubation: not for breathing GERD: denies Allergic Rhinitis: yes ACT:  Asthma Control Test ACT Total Score  10/22/2019 16    ILD REVIEW OF SYSTEMS No exposure to asbestos, silica or other organic allergens    -pt worked in Education administrator for athletic teams.  No birds or chickens Pet cat No water damage or mold exposure in home No hot tub at home No arthralgias or myalgias Denies skin rash or lesions Denies nail changes or finger splitting +Raynauds in the past - doesn't happen currently.  Denies dry eyes or dry mouth Denies dysphagia or heartburn PPD negative and no h/o exposure +amiodarone exposure Originally from [place] - lived in [place] over 30 years.    Denies lightheadedness, syncope or h/o seizure Denies vision changes Denies atopy or sinusitis Denies excessive thirst or urination. No h/o DM Denies wt change or heat or cold intolerance. No h/o thyroid disorder Denies CP, edema or palpitations Regular BMs with no hematochezia or melena No hematuria, kidney stones or known renal disease   Social History   Occupational History  . Occupation: retired  Tobacco Use  . Smoking status: Former Smoker    Packs/day: 1.00    Years: 23.00    Pack years: 23.00    Quit date: 06/14/1989    Years since quitting: 30.3  . Smokeless tobacco: Never Used  . Tobacco comment: smoked 1961-1991 , up to < 1 ppd  Substance and Sexual Activity  . Alcohol use: Yes    Alcohol/week: 14.0 standard drinks    Types: 14 Glasses of wine per week    Comment: Wine  . Drug use: No  . Sexual activity: Not on file    Relevant family history: Family History  Problem Relation Age of Onset  . Heart attack Brother 79  . Parkinsonism Mother   . Stroke Mother   . Heart attack Father        pre 27  . Diabetes Sister   . COPD Sister        X 3  . Other Son        suicide 2016  . Colon cancer Neg Hx   . Cancer Neg Hx   .  Esophageal cancer Neg Hx   . Liver cancer Neg Hx   . Pancreatic cancer Neg Hx   . Rectal cancer Neg Hx   . Stomach cancer Neg Hx     Past Medical History:  Diagnosis Date  . Adenomatous colon polyp 2007 & 2010    Dr [name]  . Allergy    seasonal  . Arthritis   . Asthma   . Cataract   . Diverticulosis   . 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 Hyperlipidemia   . Hypertension   . IBS (irritable bowel syndrome)   . Legally blind   . MVP (mitral valve prolapse)   . Paroxysmal atrial fibrillation with rapid ventricular response ([facility]) 08/29/2017  . Peripheral vascular disease ([facility])   . RAD (reactive airway disease)     Past Surgical History:  Procedure Laterality Date  . ABDOMINAL HYSTERECTOMY     with USO for  dysfunctionall menses  . APPENDECTOMY    . ARTERY BIOPSY Bilateral 07/21/2017   Procedure: BIOPSY BILATERAL TEMPORAL ARTERY;  Surgeon: Crystal Mould, MD;  Location: [facility];  Service: Vascular;  Laterality: Bilateral;  . CARDIOVERSION N/A 06/16/2018   Procedure: CARDIOVERSION;  Surgeon: Crystal Perla, MD;  Location: [facility];  Service: Cardiovascular;  Laterality: N/A;  . CATARACT EXTRACTION W/ INTRAOCULAR LENS IMPLANT  2013   bilateral; Dr Crystal [name]  . COLONOSCOPY  2013   negative , due 2018; Dr [name]  . COLONOSCOPY W/ POLYPECTOMY      X2 ; diverticulosis  . EMBOLECTOMY Right 08/29/2017   Procedure: Right Groin Exploration  ;  Surgeon: Crystal Posner, MD;  Location: [facility];  Service: Vascular;  Laterality: Right;  . HIP PINNING,CANNULATED Right 03/21/2018  . HIP PINNING,CANNULATED Right 03/21/2018   Procedure: CANNULATED HIP PINNING;  Surgeon: Crystal Can, MD;  Location: [facility];  Service: Orthopedics;  Laterality: Right;  . LUMBAR LAMINECTOMY  2000   Dr Crystal [name]  . SHOULDER SURGERY     R shoulder  . TONSILLECTOMY      Review of systems: Review of Systems  Constitutional: Negative for chills, fever and weight loss.  HENT: Negative for congestion, sinus pain and sore throat.   Eyes: Negative for photophobia, pain, discharge and redness.  Respiratory: Positive for cough and shortness of breath. Negative for hemoptysis, sputum production and wheezing.   Cardiovascular: Negative for chest pain, palpitations and leg swelling.  Gastrointestinal: Negative for heartburn, nausea and vomiting.  Musculoskeletal: Negative for joint pain and myalgias.  Skin: Negative for rash.  Neurological: Positive for headaches. Negative for dizziness, tremors and focal weakness.  Endo/Heme/Allergies: Positive for environmental allergies.  Psychiatric/Behavioral: Negative for depression. The patient is not nervous/anxious.   All other systems reviewed and are negative.   Physical Exam: Blood  pressure 130/60, pulse 87, temperature 98 F (36.7 C), temperature source Temporal, height 5\' 4"  (1.626 m), weight 135 lb 3.2 oz (61.3 kg), SpO2 (!) 70 %. Gen:      No acute distress. Blind.  Lungs:   Bibasilar crackles R>L CV:         Regular rate and rhythm; few PACs. 2+ pitting edema Abd:      + bowel sounds; soft, non-tender; no distension MSK: no acute synovitis of DIP or PIP joints, no mechanics hands.  Skin:     Chronic venous stasis  Neuro: normal speech, no focal facial asymmetry Psych: alert and oriented x3, normal mood and affect   Data Reviewed/Medical Decision Making:  Independent  interpretation of tests: Imaging: . Review of patient's CT Chest feb 2021 images revealed emphysema (mild) lower lobe predominant fine fibrosis. Stable RML solid nodule which is relatively unchanged from CT Chest in August 2020. The patient's images have been independently reviewed by me.    PFTs: None on file.  Labs:  Lab Results  Component Value Date   WBC 12.2 (H) 07/17/2019   HGB 12.5 07/17/2019   HCT 39.2 07/17/2019   MCV 111.7 (H) 07/17/2019   PLT 338.0 07/17/2019   Lab Results  Component Value Date   NA 136 07/17/2019   K 3.9 07/17/2019   CL 99 07/17/2019   CO2 25 07/17/2019   Echo Nov 2019  - Left ventricle: The cavity size was normal. Systolic function was  normal. The estimated ejection fraction was in the range of 55%  to 60%. Wall motion was normal; there were no regional wall  motion abnormalities. Normal sinus rhythm was absent. The study  was not technically sufficient to allow evaluation of LV  diastolic dysfunction due to atrial fibrillation.  - Mitral valve: There was mild to moderate regurgitation.  - Left atrium: The atrium was moderately dilated.  - Tricuspid valve: There was mild-moderate regurgitation.  - Pulmonary arteries: PA peak pressure: 35 mm Hg (S).   Immunization status:  Immunization History  Administered Date(s) Administered  .  Fluad Quad(high Dose 65+) 02/09/2019  . Influenza Split 03/14/2012  . Influenza Whole 04/15/2003, 03/05/2008, 06/22/2008, 04/13/2013  . Influenza, High Dose Seasonal PF 03/22/2014, 03/16/2018  . Influenza-Unspecified 03/14/2014, 04/23/2015, 04/23/2016, 03/14/2017  . PFIZER SARS-COV-2 Vaccination 07/21/2019, 08/15/2019  . Pneumococcal Conjugate-13 07/18/2015  . Pneumococcal Polysaccharide-23 06/15/1995, 06/05/2010  . Tdap 06/08/2012  . Zoster 06/13/2009  . Zoster Recombinat (Shingrix) 03/03/2017, 05/14/2017    . I reviewed prior external note(s) from Dr. [name] . I reviewed the result(s) of the labs and imaging as noted above.  . 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  Temporal Arteritis on chronic prednisone Moderate Mitral Valve Regurgitation on lasix.   Plan/Recommendations: Ambulated for home oxygen today. Will prescribe home oxygen today.  Full set of PFTs to obtain a baseline lung function testing. I am most concerned about amiodarone pulmonary toxicity based on HPI. She has already stopped this. There is no significant ground glass on her CT scan so I'm not sure how helpful steroids would be in treatment.   Next follow up scan for nodule based on Fleischner's criteria for a moderate to high risk patient would be August 2022 to document two year stability.  Continue her symbicort and theophylline for now - she has been on these for many years although i'm not sure how much the theophylline is doing. Concerned that her predominance of symptoms is related to ILD - will be helpful to have PFTs.   Fleischner Society Guidelines 2017 MacMahon H, Naidich DP, Goo JM, et al. Guidelines for management of incidental pulmonary nodules detected on CT Images: From the Fleischner Society. Radiology  2017; [id number]. Copyright  2017 Radiological Society of [place].  Evaluation of the incidental solid pulmonary nodule in adults Nodule size (mm) Low (<5%) cancer risk High (>65%) or moderate (5 to 65%) cancer risk  Solitary  <6 No routine follow-up Optional CT at 12 months  6 to 8 CT at 6 to 12 months, then consider CT at 18  to 24 months CT at 6 to 12 months, then CT at 18 to 24 months  >8 CT at 3 months, then at 9 and 24 months FDG PET/CT, biopsy or resection  Multiple (evaluation based on largest nodule)  <6 No routine follow-up Optional CT at 12 months  ?6 CT at 3 to 6 months, then consider CT at 18 to 24 months CT at 3 to 6 months, then CT at 18 to 24 months    Not applicable to patients age <35 years, in lung cancer screening, with immunosuppression, known pulmonary disease or symptoms or active primary cancer.  Chest CT performed without contrast as contiguous 1 mm sections using low dose technique.  Growing or FDG-avid nodules should undergo biopsy or resection. Growth is defined as >1.5 mm increase.  Nodules unchanged for >2 years are benign.   CT: computed tomography; FDG: 18-fluorodeoxyglucose; PET: positron emission tomography.  I spent 60 minutes in the care of this patient today including pre-charting, chart review, review of results, face-to-face care, coordination of care and communication with consultants etc.).   Return to Care: Return in about 6 weeks (around 12/03/2019).  She will follow up with an APP in 2 months to follow up on PFTs.  She will see me again after that.   Lenice Llamas, MD Pulmonary and [place]  CC: [name], MD

## 2019-10-22 NOTE — Patient Instructions (Signed)
The patient should have follow up scheduled with APP in 2 months.   Prior to next visit patient should have: Full set of PFTs

## 2019-10-23 ENCOUNTER — Telehealth: Payer: Self-pay | Admitting: Internal Medicine

## 2019-10-23 DIAGNOSIS — J449 Chronic obstructive pulmonary disease, unspecified: Secondary | ICD-10-CM

## 2019-10-23 NOTE — Telephone Encounter (Signed)
Lauren reynolds and I called and spoke to [email or web address]  to get her to find out the problem pt should have had 02 delivered yesterday. Husband sent home to wait to hear from [place] will call them afgter she finds out what the problem is [place]ellen [place]

## 2019-10-23 NOTE — Telephone Encounter (Signed)
02 has been delivered to pt Crystal [name]

## 2019-10-23 NOTE — Telephone Encounter (Signed)
DME order was placed after pt's OV and DME has been specified as [employer].  Called and spoke with pt's husband Juanda Crumble about the call received from him. Stated to him that we were going to check on this and call him back once we had an update and he verbalized understanding.  PCCs, can you please help [place] out with this and provide an update when [employer] will be able to bring O2 to pt as the tank she has now which was received from our office yesterday 5/10 is now showing in the red.

## 2019-10-23 NOTE — Telephone Encounter (Signed)
lmam for Crystal [name] with [employer]

## 2019-10-23 NOTE — Telephone Encounter (Signed)
New message:   Pt's spouse is calling and states the pt needs a referral to [facility]. He states they are trying to get away from [facility] and would like to switch over to [facility]. Please advise.

## 2019-10-23 NOTE — Telephone Encounter (Signed)
ordered

## 2019-10-25 DIAGNOSIS — S72001D Fracture of unspecified part of neck of right femur, subsequent encounter for closed fracture with routine healing: Secondary | ICD-10-CM | POA: Diagnosis not present

## 2019-10-25 DIAGNOSIS — J449 Chronic obstructive pulmonary disease, unspecified: Secondary | ICD-10-CM | POA: Diagnosis not present

## 2019-10-25 DIAGNOSIS — S72001A Fracture of unspecified part of neck of right femur, initial encounter for closed fracture: Secondary | ICD-10-CM | POA: Diagnosis not present

## 2019-10-26 ENCOUNTER — Telehealth: Payer: Self-pay | Admitting: Internal Medicine

## 2019-10-26 ENCOUNTER — Ambulatory Visit: Payer: Medicare Other | Admitting: Physical Therapy

## 2019-10-26 ENCOUNTER — Ambulatory Visit: Payer: Medicare Other | Admitting: Family

## 2019-10-26 ENCOUNTER — Other Ambulatory Visit: Payer: Self-pay | Admitting: Family

## 2019-10-26 ENCOUNTER — Other Ambulatory Visit: Payer: Self-pay

## 2019-10-26 ENCOUNTER — Telehealth: Payer: Self-pay | Admitting: Family

## 2019-10-26 DIAGNOSIS — R262 Difficulty in walking, not elsewhere classified: Secondary | ICD-10-CM | POA: Insufficient documentation

## 2019-10-26 DIAGNOSIS — R2681 Unsteadiness on feet: Secondary | ICD-10-CM | POA: Insufficient documentation

## 2019-10-26 DIAGNOSIS — R296 Repeated falls: Secondary | ICD-10-CM | POA: Insufficient documentation

## 2019-10-26 MED ORDER — PREDNISONE 10 MG PO TABS: ORAL_TABLET | ORAL | 0 refills | Status: DC

## 2019-10-26 NOTE — Telephone Encounter (Signed)
Due to mobility issues and history of gout, agree to call in prescription for prednisone as requested; patient should hold her daily prednisone tablet while she is on the taper. If she continues to have flares, she and Dr. [name] may want to consider daily medication to prevent gout flares.

## 2019-10-26 NOTE — Telephone Encounter (Signed)
PCP is gone for the afternoon. She will need to see someone in regards.

## 2019-10-26 NOTE — Telephone Encounter (Signed)
Husband aware of response below.

## 2019-10-26 NOTE — Therapy (Signed)
[facility] [address] [place], [place], [address] Phone: [phone]   Fax:  [phone]  Patient Details  Name: Crystal [name] MRN: [id number] Date of Birth: 10-05-1945 Referring Provider:  [name], MD  Encounter Date: 10/26/2019  Pt arrived for therapy visit without portable 02.  Ambulated from waiting area to gym and once seated PT assessed HR and Sp02.  Pt's Sp02 on room air: 64% increasing to 82% with prolonged seated rest break.  Due to significant desaturation with activity and lack of portable 02, unable to safely perform physical therapy session today.  Assisted pt to her car via wheelchair to avoid desaturation.  Pt denied SOB, lightheadedness or other signs of hypoxia.  Advised pt and husband of the risks of prolonged time without supplemental 02 and desaturation and advised pt to bring portable 02 next week.  Pt and husband verbalized understanding and agreement.  Rico Junker, PT, DPT 10/26/19    1:58 PM    [facility] [address] [place], [place], [address] Phone: [phone]   Fax:  [phone]

## 2019-10-26 NOTE — Telephone Encounter (Signed)
New message:    Pt is calling and would like the Dr to call in the prescription she sent last time for her gout. She said it is just starting up and if she can get the medication sent to [facility] [address] [place] [address] as soon as possible. Please advise.

## 2019-10-29 ENCOUNTER — Other Ambulatory Visit: Payer: Self-pay

## 2019-10-29 ENCOUNTER — Ambulatory Visit: Payer: Medicare Other | Admitting: Cardiology

## 2019-10-29 ENCOUNTER — Encounter: Payer: Self-pay | Admitting: Cardiology

## 2019-10-29 VITALS — BP 142/66 | HR 77 | Ht 64.0 in

## 2019-10-29 DIAGNOSIS — E7800 Pure hypercholesterolemia, unspecified: Secondary | ICD-10-CM

## 2019-10-29 DIAGNOSIS — I10 Essential (primary) hypertension: Secondary | ICD-10-CM

## 2019-10-29 DIAGNOSIS — I480 Paroxysmal atrial fibrillation: Secondary | ICD-10-CM | POA: Diagnosis not present

## 2019-10-29 MED ORDER — DILTIAZEM HCL ER COATED BEADS 180 MG PO CP24: 180.0000 mg | ORAL_CAPSULE | Freq: Every day | ORAL | 3 refills | Status: DC

## 2019-10-29 NOTE — Patient Instructions (Signed)
Medication Instructions:   INCREASE DILTIAZEM TO 180 MG ONCE DAILY  *If you need a refill on your cardiac medications before your next appointment, please call your pharmacy*   Lab Work: If you have labs (blood work) drawn today and your tests are completely normal, you will receive your results only by: Marland Kitchen MyChart Message (if you have MyChart) OR . A paper copy in the mail If you have any lab test that is abnormal or we need to change your treatment, we will call you to review the results.   Follow-Up: At [facility], you and your health needs are our priority.  As part of our continuing mission to provide you with exceptional heart care, we have created designated Provider Care Teams.  These Care Teams include your primary Cardiologist (physician) and Advanced Practice Providers (APPs -  Physician Assistants and Nurse Practitioners) who all work together to provide you with the care you need, when you need it.  We recommend signing up for the patient portal called "MyChart".  Sign up information is provided on this After Visit Summary.  MyChart is used to connect with patients for Virtual Visits (Telemedicine).  Patients are able to view lab/test results, encounter notes, upcoming appointments, etc.  Non-urgent messages can be sent to your provider as well.   To learn more about what you can do with MyChart, go to [email or web address].    Your next appointment:   6 month(s)  The format for your next appointment:   Either In Person or Virtual  Provider:   You may see [name], MD or one of the following Advanced Practice Providers on your designated Care Team:    [name], PA-C  [place], [place]  Coletta Memos, [place]

## 2019-10-31 ENCOUNTER — Other Ambulatory Visit: Payer: Self-pay

## 2019-10-31 ENCOUNTER — Ambulatory Visit: Payer: Medicare Other | Attending: Internal Medicine | Admitting: Physical Therapy

## 2019-10-31 DIAGNOSIS — R296 Repeated falls: Secondary | ICD-10-CM

## 2019-10-31 DIAGNOSIS — R262 Difficulty in walking, not elsewhere classified: Secondary | ICD-10-CM | POA: Diagnosis not present

## 2019-10-31 DIAGNOSIS — R2681 Unsteadiness on feet: Secondary | ICD-10-CM

## 2019-10-31 NOTE — Therapy (Signed)
[facility] [address] [place], [place], [address] Phone: [phone]   Fax:  [phone]  Physical Therapy Treatment  Patient Details  Name: Crystal [name] MRN: [id number] Date of Birth: 08-15-1945 Referring Provider (PT): [name], MD   Encounter Date: 10/31/2019  PT End of Session - 10/31/19 1218    Visit Number  27    Number of Visits  34    Date for PT Re-Evaluation  12/11/19    Authorization Type  BCBS Medicare; $40 copay.  10th visit PN.  No VL    Progress Note Due on Visit  29    PT Start Time  1025    PT Stop Time  1100    PT Time Calculation (min)  35 min    Equipment Utilized During Treatment  Other (comment)   rollator, 02 tank   Activity Tolerance  Patient tolerated treatment well    Behavior During Therapy  WFL for tasks assessed/performed       Past Medical History:  Diagnosis Date  . Adenomatous colon polyp 2007 & 2010    Dr [name]  . Allergy    seasonal  . Arthritis   . Asthma   . Cataract   . Diverticulosis   . 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 Hyperlipidemia   . Hypertension   . IBS (irritable bowel syndrome)   . Legally blind   . MVP (mitral valve prolapse)   . Paroxysmal atrial fibrillation with rapid ventricular response ([place]) 08/29/2017  . Peripheral vascular disease ([place])   . RAD (reactive airway disease)     Past Surgical History:  Procedure Laterality Date  . ABDOMINAL HYSTERECTOMY     with USO for dysfunctionall menses  . APPENDECTOMY    . ARTERY BIOPSY Bilateral 07/21/2017   Procedure: BIOPSY BILATERAL TEMPORAL ARTERY;  Surgeon: [name], MD;  Location: ;  Service: Vascular;  Laterality: Bilateral;  . CARDIOVERSION N/A 06/16/2018   Procedure: CARDIOVERSION;  Surgeon: [name], MD;  Location: [facility];  Service: Cardiovascular;  Laterality: N/A;  . CATARACT EXTRACTION W/ INTRAOCULAR LENS IMPLANT  2013   bilateral; Dr  [name]  . COLONOSCOPY  2013   negative , due 2018; Dr [name]  . COLONOSCOPY W/ POLYPECTOMY      X2 ; diverticulosis  . EMBOLECTOMY Right 08/29/2017   Procedure: Right Groin Exploration  ;  Surgeon: Rosetta Posner, MD;  Location: [facility];  Service: Vascular;  Laterality: Right;  . HIP PINNING,CANNULATED Right 03/21/2018  . HIP PINNING,CANNULATED Right 03/21/2018   Procedure: CANNULATED HIP PINNING;  Surgeon: Rod Can, MD;  Location: [facility];  Service: Orthopedics;  Laterality: Right;  . LUMBAR LAMINECTOMY  2000   Dr Vertell Limber  . SHOULDER SURGERY     R shoulder  . TONSILLECTOMY      Vitals:   10/31/19 1029  SpO2: (!) 84%    Subjective Assessment - 10/31/19 1029    Subjective  Pt arrived in transport chair and with 02 on 1L; increased to 2L to maintain Sp02 >90%; has gout in her foot.    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  Yes    Pain Location  Foot    Pain Descriptors / Indicators  Aching                        [facility] Adult PT Treatment/Exercise - 10/31/19 1039      Transfers   Transfers  Sit to Stand;Stand to Sit    Sit to Stand  5: Supervision    Stand to Sit  5: Supervision      Ambulation/Gait   Ambulation/Gait  Yes    Ambulation/Gait Assistance  4: Min guard    Ambulation/Gait Assistance Details  with rollator carrying 02 tank assessing endurance and Sp02 walking with rollator - Sp02 on 2L was 81%, increased to 90% with 2.5 L and pursed lip breathing.    Ambulation Distance (Feet)  230 Feet    Assistive device  Rollator    Gait Pattern  Poor foot clearance - left   due to gout   Ambulation Surface  Level;Indoor    Ramp  4: Min assist;3: Mod assist    Ramp Details (indicate cue type and reason)   with rollator and therapist guiding pt up and down ramp for safety; cues to keep forward momentum and increased step length when ascending and use of brakes to control descent down ramp with continued cues for increased step length.  Performed x 2.  Also cued pt for pursed lip breathing due to pt holding her breath     Curb  3: Mod assist;4: Min assist    Curb Details (indicate cue type and reason)  performed on inside curb x2 reps with decreased verbal cues today to recall sequence but pt continues to require min-mod A for balance especially when descending due to fear of falling forwards    Gait Comments  After curb and ramp negotiation pt Sp02 on 2.5L = 94% with pt using pursed lip breathing during activities.      Therapeutic Activites    Therapeutic Activities  Other Therapeutic Activities    Other Therapeutic Activities  pursed lip breathing during rest breaks and activities to maintain Sp02; Pt arrived in transport w/c to allow husband to be able to assist pt and bring 02 tank.  Discussed coming with rollator and walking into clinic with 02 tank on rollator.  Pt does not feel comfortable walking with husband and rollator due to fear of him falling.  PT discussed practicing with pt and husband next session.  Plan for next session to walk with rollator with husband into and out of clinic multiple times to ensure safety.             PT Education - 10/31/19 1218    Education Details  continued gait training, pursed lip breathing, plan for next session    Person(s) Educated  Patient    Methods  Explanation;Demonstration    Comprehension  Verbalized understanding;Returned demonstration;Need further instruction       PT Short Term Goals - 10/12/19 1553      PT SHORT TERM GOAL #1   Title  Pt will demonstrate ability to perform ongoing HEP with supervision    Time  4    Period  Weeks    Status  Revised    Target Date  11/11/19      PT SHORT TERM GOAL #2   Title  Pt will ambulate  up/down ramp and curb with rollator with consistent min A    Time  4    Period  Weeks    Status  Revised    Target Date  11/11/19  PT SHORT TERM GOAL #3   Title  Pt will increase BERG score to >/= 38/56    Baseline  34/56    Time  4    Period  Weeks    Status  Revised    Target Date  11/11/19      PT SHORT TERM GOAL #4   Title  Pt will initiate aquatic therapy for balance and gait training    Time  4    Period  Weeks    Status  Revised    Target Date  11/11/19        PT Long Term Goals - 10/12/19 1557      PT LONG TERM GOAL #1   Title  Pt will demonstrate ability to perform final HEP with supervision    Time  8    Period  Weeks    Status  Revised    Target Date  12/11/19      PT LONG TERM GOAL #2   Title  Pt will demonstrate negative positional testing for multiple canals    Time  8    Period  Weeks    Status  Revised    Target Date  12/11/19      PT LONG TERM GOAL #3   Title  Pt will demonstrate ability to stand ~5 minutes without UE support and without leaning against furniture with supervision for pt to return to fixing hair at sink safely    Baseline  leans against counter top    Time  8    Period  Weeks    Status  New    Target Date  12/11/19      PT LONG TERM GOAL #4   Title  Pt will demonstrate ability to ambulate 500' outside over pavement and negotiate curb with LRAD and min A    Baseline  Min A to ambulate 250', mod A for curb    Time  8    Period  Weeks    Status  Revised    Target Date  12/11/19      PT LONG TERM GOAL #5   Title  Pt will decrease falls risk as indicated by increase in BERG to >/= 40/56    Time  8    Period  Weeks    Status  Revised    Target Date  12/11/19            Plan - 10/31/19 1219    Clinical Impression Statement  Pt arrives with portable 02 today but pt and husband continued to require education about keeping 02 on and at a level sufficient to keep Sp02 at 90%, especially when performing ambulation.  Also  educated pt on use of pursed lip breathing during activities.  Will practice ambulating into/out of clinic with rollator to transport 02 tank with husband to ensure safety.  Continued gait training on ramp and curb to improve community mobility and safety.  With pursed lip breathing pt was able to maintain Sp02 at 94%.  Will continue to progress towards LTG.    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, IBS, HTN, HLD, femoral artery thrombosis, diverticulosis, cataract, OA, R shoulder surgery, lumbar laminectomy, R cannulated hip surgery, and embolectomy    Examination-Activity Limitations  Bed Mobility;Locomotion Level;Stand;Transfers;Stairs    Examination-Participation Restrictions  Shop;Community  Activity    Rehab Potential  Good    PT Frequency  1x / week    PT Duration  8 weeks    PT Treatment/Interventions  ADLs/Self Care Home Management;Canalith Repostioning;Gait training;Stair training;Functional mobility training;Therapeutic activities;Therapeutic exercise;Balance training;Neuromuscular re-education;Patient/family education;Vestibular;Aquatic Therapy;DME Instruction    PT Next Visit Plan  PT IS COMPLETELY VISUALLY IMPAIRED - USES WHITE CANE.  Using supplemental 02 now - 2-2.5 L; pursed lip breathing.  Update HEP based on progress/BERG.  Gait training with rollator on curb- outside, inclines, curb.  If +2 work on stepping strategy with perturbations.  Standing on rockerboard - UE lifts, head/trunk righting, head tilts.  Step ups in // bars, sit <> stand with decreased UE support, corner balance with decreased UE support - dynamic UE movement to simulate fixing hair, wall bumps.    Consulted and Agree with Plan of Care  Patient;Family member/caregiver    Family Member Consulted  Husband       Patient will  benefit from skilled therapeutic intervention in order to improve the following deficits and impairments:  Decreased balance, Difficulty walking, Dizziness  Visit Diagnosis: Repeated falls  Unsteadiness on feet  Difficulty in walking, not elsewhere classified     Problem List Patient Active Problem List   Diagnosis Date Noted  . Acute gout 09/21/2019  . Vertigo of central origin 02/09/2019  . Other peripheral vertigo, unspecified ear 02/09/2019  . Nonintractable headache 02/09/2019  . Pruritus 01/02/2019  . Hypokalemia 08/09/2018  . Long term (current) use of anticoagulants 08/01/2018  . Pre-operative clearance 08/01/2018  . Closed compression fracture of L4 vertebra ([place]) 07/11/2018  . Bilateral leg edema 07/11/2018  . Lung nodule < 6cm on CT 07/11/2018  . Hypothyroidism (acquired) 04/11/2018  . Fracture of femoral neck, right ([place]) 03/22/2018  . PAF (paroxysmal atrial fibrillation) ([place]) 03/18/2018  . 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 Herpes zoster without complication [id number]  . Jaw claudication 07/27/2017  . Temporal arteritis ([place]) 07/18/2017  . Vision, loss, sudden, bilateral 07/18/2017  . 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 Osteoarthritis 06/25/2016  . Anxiety 06/25/2016  . Back pain 07/18/2015  . 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 Carotid bruit present 06/05/2010  . Osteoporosis 06/03/2008  . History of colonic polyps 06/03/2008  . INTENTION TREMOR 12/06/2007  . IBS 10/16/2007  . Hyperlipidemia 06/06/2007  . Essential hypertension 06/06/2007  . Chronic obstructive airway disease with asthma ([place]) 02/23/2007    Rico Junker, PT, DPT 10/31/19    12:25 PM    [facility] [address] [place], [place], [address] Phone: [phone]   Fax:  [phone]  Name: STONE VILLIARD MRN: [id number] Date of Birth: January 23, 1946

## 2019-11-04 ENCOUNTER — Other Ambulatory Visit: Payer: Self-pay | Admitting: Internal Medicine

## 2019-11-05 ENCOUNTER — Ambulatory Visit: Payer: Medicare Other | Admitting: Physical Therapy

## 2019-11-05 ENCOUNTER — Other Ambulatory Visit: Payer: Self-pay

## 2019-11-05 DIAGNOSIS — R2681 Unsteadiness on feet: Secondary | ICD-10-CM | POA: Diagnosis not present

## 2019-11-05 DIAGNOSIS — R262 Difficulty in walking, not elsewhere classified: Secondary | ICD-10-CM | POA: Diagnosis not present

## 2019-11-05 DIAGNOSIS — R296 Repeated falls: Secondary | ICD-10-CM | POA: Diagnosis not present

## 2019-11-05 NOTE — Therapy (Signed)
[facility] [address] [place], [place], [address] Phone: [phone]   Fax:  [phone]  Physical Therapy Treatment  Patient Details  Name: Crystal [name] MRN: [id number] Date of Birth: 1946-02-14 Referring Provider (PT): [name], MD   Encounter Date: 11/05/2019  PT End of Session - 11/05/19 1157    Visit Number  28    Number of Visits  34    Date for PT Re-Evaluation  12/11/19    Authorization Type  BCBS Medicare; $40 copay.  10th visit PN.  No VL    Progress Note Due on Visit  29    PT Start Time  1105    PT Stop Time  1150    PT Time Calculation (min)  45 min    Equipment Utilized During Treatment  Other (comment)   rollator, 02 tank   Activity Tolerance  Patient tolerated treatment well    Behavior During Therapy  WFL for tasks assessed/performed       Past Medical History:  Diagnosis Date  . Adenomatous colon polyp 2007 & 2010    Dr [name]  . Allergy    seasonal  . Arthritis   . Asthma   . Cataract   . Diverticulosis   . 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 Hyperlipidemia   . Hypertension   . IBS (irritable bowel syndrome)   . Legally blind   . MVP (mitral valve prolapse)   . Paroxysmal atrial fibrillation with rapid ventricular response ([place]) 08/29/2017  . Peripheral vascular disease ([place])   . RAD (reactive airway disease)     Past Surgical History:  Procedure Laterality Date  . ABDOMINAL HYSTERECTOMY     with USO for dysfunctionall menses  . APPENDECTOMY    . ARTERY BIOPSY Bilateral 07/21/2017   Procedure: BIOPSY BILATERAL TEMPORAL ARTERY;  Surgeon: [name], MD;  Location: [facility];  Service: Vascular;  Laterality: Bilateral;  . CARDIOVERSION N/A 06/16/2018   Procedure: CARDIOVERSION;  Surgeon: [name], MD;  Location: [facility];  Service: Cardiovascular;  Laterality: N/A;  . CATARACT EXTRACTION W/ INTRAOCULAR LENS IMPLANT  2013   bilateral; Dr  [name]  . COLONOSCOPY  2013   negative , due 2018; Dr [name]  . COLONOSCOPY W/ POLYPECTOMY      X2 ; diverticulosis  . EMBOLECTOMY Right 08/29/2017   Procedure: Right Groin Exploration  ;  Surgeon: Rosetta Posner, MD;  Location: [facility];  Service: Vascular;  Laterality: Right;  . HIP PINNING,CANNULATED Right 03/21/2018  . HIP PINNING,CANNULATED Right 03/21/2018   Procedure: CANNULATED HIP PINNING;  Surgeon: Rod Can, MD;  Location: [facility];  Service: Orthopedics;  Laterality: Right;  . LUMBAR LAMINECTOMY  2000   Dr Vertell Limber  . SHOULDER SURGERY     R shoulder  . TONSILLECTOMY      Vitals:   11/05/19 1114  SpO2: 92%    Subjective Assessment - 11/05/19 1113    Subjective  Brought rollator today, walked in with husband's guidance without any issues.  Does not feel they need to practice with therapy.  On 2L today.  Did some walking yesterday outside with daughter in law and 02 remained stable.    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  No/denies         [facility] PT Assessment - 11/05/19 1117      Standardized Balance Assessment   Standardized Balance Assessment  Berg Balance Test      Berg Balance Test   Sit to Stand  Able to stand without using hands and stabilize independently    Standing Unsupported  Able to stand 2 minutes with supervision    Sitting with Back Unsupported but Feet Supported on Floor or Stool  Able to sit safely and securely 2 minutes    Stand to Sit  Controls descent by using hands    Transfers  Able to transfer safely, definite need of hands    Standing Unsupported with Eyes Closed  Able to stand 10 seconds with supervision    Standing Unsupported with Feet Together  Able to place feet together independently  and stand for 1 minute with supervision    From Standing, Reach Forward with Outstretched Arm  Can reach confidently >25 cm (10")    From Standing Position, Pick up Object from [place] to pick up shoe safely and easily    From Standing Position, Turn to Look Behind Over each Shoulder  Looks behind from both sides and weight shifts well    Turn 360 Degrees  Able to turn 360 degrees safely but slowly    Standing Unsupported, Alternately Place Feet on Step/Stool  Able to complete >2 steps/needs minimal assist    Standing Unsupported, One Foot in Front  Able to take small step independently and hold 30 seconds    Standing on One Leg  Unable to try or needs assist to prevent fall    Total Score  40    Berg comment:  40/56                    [facility] Adult PT Treatment/Exercise - 11/05/19 1155      Ambulation/Gait   Ambulation/Gait  Yes    Ambulation/Gait Assistance  4: Min guard    Ambulation/Gait Assistance Details  with rollator carrying 02 tank with husband guiding front of rollator to avoid obstacles; pt and husband able to enter/exit clinic safely    Ambulation Distance (Feet)  115 Feet    Assistive device  Rollator    Ambulation Surface  Level;Indoor;Outdoor;Paved      Knee/Hip Exercises: Standing   Step Down  Right;Left;1 set;10 reps;Step Height: 4";Hand Hold: 2    Step Down Limitations  in // bars    Functional Squat  1 set;10 reps    Functional Squat Limitations  standing on 4" step reaching over top of back of chair to simulate reaching down to place rollator off curb with min A; educated pt on using squat as counter balance when reaching forwards             PT Education - 11/05/19 1156    Education Details  progress towards goals, did not need to perform education with husband for assisting pt with rollator - practiced over the weekend and walked in today with rollator    Person(s) Educated  Patient    Methods  Explanation    Comprehension  Verbalized  understanding       PT Short Term Goals - 11/05/19 1115      PT SHORT TERM GOAL #1   Title  Pt will demonstrate ability to perform ongoing HEP with supervision    Time  4    Period  Weeks  Status  Achieved    Target Date  11/11/19      PT SHORT TERM GOAL #2   Title  Pt will ambulate up/down ramp and curb with rollator with consistent min A    Baseline  min A to ascend, min-mod A to descend    Time  4    Period  Weeks    Status  Partially Met    Target Date  11/11/19      PT SHORT TERM GOAL #3   Title  Pt will increase BERG score to >/= 38/56    Baseline  40/56    Time  4    Period  Weeks    Status  Achieved    Target Date  11/11/19      PT SHORT TERM GOAL #4   Title  Pt will initiate aquatic therapy for balance and gait training    Baseline  not able due to supplemental 02    Time  4    Period  Weeks    Status  Deferred    Target Date  11/11/19        PT Long Term Goals - 10/12/19 1557      PT LONG TERM GOAL #1   Title  Pt will demonstrate ability to perform final HEP with supervision    Time  8    Period  Weeks    Status  Revised    Target Date  12/11/19      PT LONG TERM GOAL #2   Title  Pt will demonstrate negative positional testing for multiple canals    Time  8    Period  Weeks    Status  Revised    Target Date  12/11/19      PT LONG TERM GOAL #3   Title  Pt will demonstrate ability to stand ~5 minutes without UE support and without leaning against furniture with supervision for pt to return to fixing hair at sink safely    Baseline  leans against counter top    Time  8    Period  Weeks    Status  New    Target Date  12/11/19      PT LONG TERM GOAL #4   Title  Pt will demonstrate ability to ambulate 500' outside over pavement and negotiate curb with LRAD and min A    Baseline  Min A to ambulate 250', mod A for curb    Time  8    Period  Weeks    Status  Revised    Target Date  12/11/19      PT LONG TERM GOAL #5   Title  Pt will decrease  falls risk as indicated by increase in BERG to >/= 40/56    Time  8    Period  Weeks    Status  Revised    Target Date  12/11/19            Plan - 11/05/19 1158    Clinical Impression Statement  Pt and husband able to safely enter/exit clinic with rollator and husband guiding patient.  Did not require further education or training.  Performed assessment of progress towards STG.  Pt is making good progress and has met 2/4 LTG - pt met BERG goal and HEP goal and has been performing HEP consistently with daughter in law and friend.  Pt partially met curb/ramp goal - with min A to ascend, min-mod A to descend due  to ongoing imbalance when reaching forwards.  Pt continues to practice with daughter in law and therapy.  Aquatic therapy goal deferred due to addition of supplemental 02 and is not appropriate for aquatic therapy at this time.  Pt is demonstrating improved balance with improved ability to perform sit > stand, transfer and remain standing without UE support with supervision.  Will continue to address impairments in order to progress towards LTG.    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, IBS, HTN, HLD, femoral artery thrombosis, diverticulosis, cataract, OA, R shoulder surgery, lumbar laminectomy, R cannulated hip surgery, and embolectomy    Examination-Activity Limitations  Bed Mobility;Locomotion Level;Stand;Transfers;Stairs    Examination-Participation Restrictions  Shop;Community Activity    Rehab Potential  Good    PT Frequency  1x / week    PT Duration  8 weeks    PT Treatment/Interventions  ADLs/Self Care Home Management;Canalith Repostioning;Gait training;Stair training;Functional mobility training;Therapeutic activities;Therapeutic exercise;Balance training;Neuromuscular  re-education;Patient/family education;Vestibular;Aquatic Therapy;DME Instruction    PT Next Visit Plan  PT IS COMPLETELY VISUALLY IMPAIRED - USES WHITE CANE.  Using supplemental 02 now - 2-2.5 L; pursed lip breathing.  10th visit PN due.  Update HEP based on progress/BERG.  Gait training with rollator on curb- outside, inclines, curb.  If +2 work on stepping strategy with perturbations.  Standing on rockerboard - UE lifts, head/trunk righting, head tilts.  Step up and down in // bars on 6" step, reaching down from step, sit <> stand with decreased UE support, corner balance with decreased UE support - dynamic UE movement to simulate fixing hair, wall bumps.    Consulted and Agree with Plan of Care  Patient;Family member/caregiver    Family Member Consulted  Husband       Patient will benefit from skilled therapeutic intervention in order to improve the following deficits and impairments:  Decreased balance, Difficulty walking, Dizziness  Visit Diagnosis: Repeated falls  Unsteadiness on feet  Difficulty in walking, not elsewhere classified     Problem List Patient Active Problem List   Diagnosis Date Noted  . Acute gout 09/21/2019  . Vertigo of central origin 02/09/2019  . Other peripheral vertigo, unspecified ear 02/09/2019  . Nonintractable headache 02/09/2019  . Pruritus 01/02/2019  . Hypokalemia 08/09/2018  . Long term (current) use of anticoagulants 08/01/2018  . Pre-operative clearance 08/01/2018  . Closed compression fracture of L4 vertebra ([place]) 07/11/2018  . Bilateral leg edema 07/11/2018  . Lung nodule < 6cm on CT 07/11/2018  . Hypothyroidism (acquired) 04/11/2018  . Fracture of femoral neck, right ([place]) 03/22/2018  . PAF (paroxysmal atrial fibrillation) ([place]) 03/18/2018  . 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 Herpes zoster without complication 24/23/5361  . Jaw claudication 07/27/2017  . Temporal arteritis ([place]) 07/18/2017  . Vision, loss, sudden,  bilateral 07/18/2017  . 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 Osteoarthritis 06/25/2016  . Anxiety 06/25/2016  . Back pain 07/18/2015  . 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 Carotid bruit present 06/05/2010  . Osteoporosis 06/03/2008  . History of colonic polyps 06/03/2008  . INTENTION TREMOR 12/06/2007  . IBS 10/16/2007  . Hyperlipidemia 06/06/2007  . Essential hypertension 06/06/2007  . Chronic obstructive airway disease with asthma ([place]) 02/23/2007   Rico Junker, PT, DPT 11/05/19    12:10 PM    [facility] [address]  [place], [place], [address] Phone: [phone]   Fax:  [phone]  Name: Crystal [name] MRN: [id number] Date of Birth: [birth year]

## 2019-11-08 ENCOUNTER — Other Ambulatory Visit: Payer: Self-pay | Admitting: Internal Medicine

## 2019-11-15 ENCOUNTER — Telehealth: Payer: Self-pay | Admitting: Internal Medicine

## 2019-11-15 ENCOUNTER — Encounter: Payer: Medicare Other | Admitting: Physical Therapy

## 2019-11-15 DIAGNOSIS — J9611 Chronic respiratory failure with hypoxia: Secondary | ICD-10-CM

## 2019-11-15 NOTE — Progress Notes (Signed)
  Chronic Care Management   Note  11/15/2019 Name: Crystal [name] MRN: [id number] DOB: September 27, 1945  Crystal [name] is a 74 y.o. year old female who is a primary care patient of Burns, Claudina Lick, MD. I reached out to [name] by phone today in response to a referral sent by [name] PCP, [name], MD.   [name] was given information about Chronic Care Management services today including:  1. CCM service includes personalized support from designated clinical staff supervised by her physician, including individualized plan of care and coordination with other care providers 2. 24/7 contact phone numbers for assistance for urgent and routine care needs. 3. Service will only be billed when office clinical staff spend 20 minutes or more in a month to coordinate care. 4. Only one practitioner may furnish and bill the service in a calendar month. 5. The patient may stop CCM services at any time (effective at the end of the month) by phone call to the office staff.   Patient agreed to services and verbal consent obtained.   This note is not being shared with the patient for the following reason: To respect privacy (The patient or proxy has requested that the information not be shared).  Follow up plan:   Earney [place] Upstream Scheduler

## 2019-11-15 NOTE — Progress Notes (Signed)
  Chronic Care Management   Outreach Note  11/15/2019 Name: Crystal [name] MRN: [id number] DOB: [birth year]  Referred by: [name], MD Reason for referral : No chief complaint on file.   An unsuccessful telephone outreach was attempted today. The patient was referred to the pharmacist for assistance with care management and care coordination.   This note is not being shared with the patient for the following reason: To respect privacy (The patient or proxy has requested that the information not be shared).  Follow Up Plan:   Earney Hamburg Upstream Scheduler

## 2019-11-22 ENCOUNTER — Encounter: Payer: Self-pay | Admitting: Physical Therapy

## 2019-11-22 ENCOUNTER — Other Ambulatory Visit: Payer: Self-pay

## 2019-11-22 ENCOUNTER — Ambulatory Visit: Payer: Medicare Other | Attending: Internal Medicine | Admitting: Physical Therapy

## 2019-11-22 DIAGNOSIS — R296 Repeated falls: Secondary | ICD-10-CM | POA: Diagnosis not present

## 2019-11-22 DIAGNOSIS — R262 Difficulty in walking, not elsewhere classified: Secondary | ICD-10-CM | POA: Diagnosis not present

## 2019-11-22 DIAGNOSIS — H8112 Benign paroxysmal vertigo, left ear: Secondary | ICD-10-CM | POA: Insufficient documentation

## 2019-11-22 DIAGNOSIS — R42 Dizziness and giddiness: Secondary | ICD-10-CM | POA: Diagnosis not present

## 2019-11-22 DIAGNOSIS — R2681 Unsteadiness on feet: Secondary | ICD-10-CM | POA: Diagnosis not present

## 2019-11-22 NOTE — Therapy (Signed)
[facility] [address] [place], [place], [address] Phone: [phone]   Fax:  [phone]  Physical Therapy Treatment and 10th visit Progress Note  Patient Details  Name: Crystal [name] MRN: [id number] Date of Birth: 04/13/1946 Referring Provider (PT): [name], MD   Encounter Date: 11/22/2019   Progress Note Reporting Period 08/07/19 to 11/22/19  See note below for Objective Data and Assessment of Progress/Goals.     PT End of Session - 11/22/19 1157    Visit Number 29   10th visit note performed this session   Number of Visits 34    Date for PT Re-Evaluation 12/11/19    Authorization Type BCBS Medicare; $40 copay.  10th visit PN.  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   Hyperlipidemia    Hypertension    IBS (irritable bowel syndrome)    Legally blind    MVP (mitral valve prolapse)    Paroxysmal atrial fibrillation with rapid ventricular response ([place]) 08/29/2017   Peripheral vascular disease ([place])    RAD (reactive airway disease)     Past Surgical History:  Procedure Laterality Date   ABDOMINAL HYSTERECTOMY     with USO for dysfunctionall menses   APPENDECTOMY     ARTERY BIOPSY Bilateral 07/21/2017   Procedure: BIOPSY BILATERAL TEMPORAL ARTERY;  Surgeon: [name], MD;  Location: [facility];  Service: Vascular;  Laterality: Bilateral;   CARDIOVERSION N/A 06/16/2018   Procedure:  CARDIOVERSION;  Surgeon: [name], MD;  Location: [facility];  Service: Cardiovascular;  Laterality: N/A;   CATARACT EXTRACTION W/ INTRAOCULAR LENS IMPLANT  2013   bilateral; Dr [name]   COLONOSCOPY  2013   negative , due 2018; Dr [name]   COLONOSCOPY W/ POLYPECTOMY      X2 ; diverticulosis   EMBOLECTOMY Right 08/29/2017   Procedure: Right Groin Exploration  ;  Surgeon: Rosetta Posner, MD;  Location: [facility];  Service: Vascular;  Laterality: Right;   HIP PINNING,CANNULATED Right 03/21/2018   HIP PINNING,CANNULATED Right 03/21/2018   Procedure: CANNULATED HIP PINNING;  Surgeon: Rod Can, MD;  Location: [facility];  Service: Orthopedics;  Laterality: Right;   LUMBAR LAMINECTOMY  2000   Dr Vertell Limber   SHOULDER SURGERY     R shoulder   TONSILLECTOMY      There were no vitals filed for this visit.   Subjective Assessment - 11/22/19 1026    Subjective Still using rollator to transport 02; no significant changes, weather has not been condusive to going on long walks or practice curb outside.    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  Combined stepping forwards and weight shifting into one exercise, added heel and toe walking with backwards walking and quarter turns.  Progressed staggered stance to include head movements.  Access Code: [email or web address] URL: [email or web address] Date: 11/22/2019 Prepared by: Misty Stanley  Exercises Forward Step with Overhead Arm Swing - 1 x daily - 7 x weekly - 2 sets - 10 reps Heel Toe Raises with Counter Support - 1 x daily - 7 x weekly - 10 reps - 3 sets Heel Walking with Counter Support - 1 x daily - 7 x weekly - 3 sets Toe Walking with  Counter Support - 1 x daily - 7 x weekly - 3 sets Backward Walking with Counter Support - 1 x daily - 7 x weekly - 10 reps - 3 sets Standing Quarter Turn with Counter Support - 1 x daily - 7 x weekly - 5 reps Staggered stance with Eyes Open - 1 x daily - 7 x weekly - 2 sets - 5 reps     PT Education - 11/22/19 1157    Education Details updating and progressing HEP    Person(s) Educated Patient;Spouse    Methods Explanation;Demonstration;Handout    Comprehension Verbalized understanding;Returned demonstration            PT Short Term Goals - 11/05/19 1115      PT SHORT TERM GOAL #1   Title Pt will demonstrate ability to perform ongoing HEP with supervision    Time 4    Period Weeks    Status Achieved    Target Date 11/11/19      PT SHORT TERM GOAL #2   Title Pt will ambulate up/down ramp and curb with rollator with consistent min A    Baseline min A to ascend, min-mod A to descend    Time 4    Period Weeks    Status Partially Met    Target Date 11/11/19      PT SHORT TERM GOAL #3   Title Pt will increase BERG score to >/= 38/56    Baseline 40/56    Time 4    Period Weeks    Status Achieved    Target Date 11/11/19      PT SHORT TERM GOAL #4   Title Pt will initiate aquatic therapy for balance and gait training    Baseline not able due to supplemental 02    Time 4    Period Weeks    Status Deferred    Target Date 11/11/19             PT Long Term Goals - 10/12/19 1557      PT LONG TERM GOAL #1   Title Pt will demonstrate ability to perform final HEP with supervision    Time 8    Period Weeks    Status Revised    Target Date 12/11/19      PT LONG TERM GOAL #2   Title Pt will demonstrate negative positional testing for multiple canals    Time 8    Period Weeks    Status Revised    Target Date 12/11/19      PT LONG TERM GOAL #3   Title Pt will demonstrate ability to stand ~5 minutes without UE support and without leaning against furniture with  supervision for pt to return to fixing hair at sink safely  Baseline leans against counter top    Time 8    Period Weeks    Status New    Target Date 12/11/19      PT LONG TERM GOAL #4   Title Pt will demonstrate ability to ambulate 500' outside over pavement and negotiate curb with LRAD and min A    Baseline Min A to ambulate 250', mod A for curb    Time 8    Period Weeks    Status Revised    Target Date 12/11/19      PT LONG TERM GOAL #5   Title Pt will decrease falls risk as indicated by increase in BERG to >/= 40/56    Time 8    Period Weeks    Status Revised    Target Date 12/11/19                 Plan - 11/22/19 1159    Clinical Impression Statement Pt recertified at last visit for 4 more visits to continue to address balance impairments but patient's dizziness has resolved and pt has not had a reoccurrence of dizziness. Due to progress made session focused on progression of HEP to more dynamic movements. Pt return demonstrated all exercises with verbal and tactile cues to perform correct technique and for safety and will continue to have the supervision and assistance of daughter in law and friend when performing exercises.  Pt is continuing to ambulate with rollator and supplemental 02 and is not appropriate for ambulation with cane and is no longer appropriate for aquatic therapy due to supplemental 02.  Will continue to address balance and higher level gait training with rollator to maximize independence and decrease falls risk.    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, IBS, HTN, HLD, femoral artery thrombosis, diverticulosis, cataract, OA, R shoulder surgery, lumbar laminectomy, R cannulated hip surgery, and embolectomy    Examination-Activity Limitations Bed Mobility;Locomotion  Level;Stand;Transfers;Stairs    Examination-Participation Restrictions Shop;Community Activity    Rehab Potential Good    PT Frequency 1x / week    PT Duration 8 weeks    PT Treatment/Interventions ADLs/Self Care Home Management;Canalith Repostioning;Gait training;Stair training;Functional mobility training;Therapeutic activities;Therapeutic exercise;Balance training;Neuromuscular re-education;Patient/family education;Vestibular;Aquatic Therapy;DME Instruction    PT Next Visit Plan PT IS COMPLETELY VISUALLY IMPAIRED - USES WHITE CANE.  Using supplemental 02 now - 1.5L.  Gait training with rollator on curb- outside, inclines, curb.  If +2 work on stepping strategy with perturbations.  Standing on rockerboard - UE lifts, head/trunk righting, head tilts.  Step up and down in // bars on 6" step, reaching down from step, sit <> stand with decreased UE support, corner balance with decreased UE support - dynamic UE movement to simulate fixing hair, wall bumps.    Consulted and Agree with Plan of Care Patient;Family member/caregiver    Family Member Consulted Husband           Patient will benefit from skilled therapeutic intervention in order to improve the following deficits and impairments:  Decreased balance, Difficulty walking, Dizziness  Visit Diagnosis: Repeated falls  Unsteadiness on feet  Difficulty in walking, not elsewhere classified     Problem List Patient Active Problem List   Diagnosis Date Noted   Acute gout 09/21/2019   Vertigo of central origin 02/09/2019   Other peripheral vertigo, unspecified ear 02/09/2019  Nonintractable headache 02/09/2019   Pruritus 01/02/2019   Hypokalemia 08/09/2018   Long term (current) use of anticoagulants 08/01/2018   Pre-operative clearance 08/01/2018   Closed compression fracture of L4 vertebra (HCC) 07/11/2018   Bilateral leg edema 07/11/2018   Lung nodule < 6cm on CT 07/11/2018   Hypothyroidism (acquired) 04/11/2018    Fracture of femoral neck, right ([place]) 03/22/2018   PAF (paroxysmal atrial fibrillation) ([place]) 03/18/2018   Femoral artery thrombosis, 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   Herpes zoster without complication 60/60/0459   Jaw claudication 07/27/2017   Temporal arteritis ([place]) 07/18/2017   Vision, loss, sudden, bilateral 07/18/2017   TMJ arthralgia 06/27/2017   Osteoarthritis 06/25/2016   Anxiety 06/25/2016   Back pain 07/18/2015   Hyperuricemia 06/28/2011   Carotid bruit present 06/05/2010   Osteoporosis 06/03/2008   History of colonic polyps 06/03/2008   INTENTION TREMOR 12/06/2007   IBS 10/16/2007   Hyperlipidemia 06/06/2007   Essential hypertension 06/06/2007   Chronic obstructive airway disease with asthma ([place]) 02/23/2007    Rico Junker, PT, DPT 11/22/19    6:09 PM    [facility] [address] [place], [place], [address] Phone: [phone]   Fax:  [phone]  Name: Crystal [name] MRN: [id number] Date of Birth: 10-02-1945

## 2019-11-22 NOTE — Patient Instructions (Addendum)
Access Code: [email or web address] URL: [email or web address] Date: 11/22/2019 Prepared by: Misty Stanley  Exercises Forward Step with Overhead Arm Swing - 1 x daily - 7 x weekly - 2 sets - 10 reps Heel Toe Raises with Counter Support - 1 x daily - 7 x weekly - 10 reps - 3 sets Heel Walking with Counter Support - 1 x daily - 7 x weekly - 3 sets Toe Walking with Counter Support - 1 x daily - 7 x weekly - 3 sets Backward Walking with Counter Support - 1 x daily - 7 x weekly - 10 reps - 3 sets Standing Quarter Turn with Counter Support - 1 x daily - 7 x weekly - 5 reps Staggered stance with Eyes Open - 1 x daily - 7 x weekly - 2 sets - 5 reps

## 2019-11-25 DIAGNOSIS — J449 Chronic obstructive pulmonary disease, unspecified: Secondary | ICD-10-CM | POA: Diagnosis not present

## 2019-11-25 DIAGNOSIS — S72001D Fracture of unspecified part of neck of right femur, subsequent encounter for closed fracture with routine healing: Secondary | ICD-10-CM | POA: Diagnosis not present

## 2019-11-25 DIAGNOSIS — S72001A Fracture of unspecified part of neck of right femur, initial encounter for closed fracture: Secondary | ICD-10-CM | POA: Diagnosis not present

## 2019-11-29 ENCOUNTER — Encounter: Payer: Self-pay | Admitting: Physical Therapy

## 2019-11-29 ENCOUNTER — Ambulatory Visit: Payer: Medicare Other | Admitting: Physical Therapy

## 2019-11-29 ENCOUNTER — Other Ambulatory Visit: Payer: Self-pay

## 2019-11-29 DIAGNOSIS — R42 Dizziness and giddiness: Secondary | ICD-10-CM | POA: Diagnosis not present

## 2019-11-29 DIAGNOSIS — R262 Difficulty in walking, not elsewhere classified: Secondary | ICD-10-CM | POA: Diagnosis not present

## 2019-11-29 DIAGNOSIS — R2681 Unsteadiness on feet: Secondary | ICD-10-CM

## 2019-11-29 DIAGNOSIS — R296 Repeated falls: Secondary | ICD-10-CM

## 2019-11-29 DIAGNOSIS — H8112 Benign paroxysmal vertigo, left ear: Secondary | ICD-10-CM | POA: Diagnosis not present

## 2019-11-29 NOTE — Therapy (Signed)
[facility] [address] [place], [place], [address] Phone: [phone]   Fax:  [phone]  Physical Therapy Treatment  Patient Details  Name: Crystal [name] MRN: [id number] Date of Birth: 1945-09-04 Referring Provider (PT): [name], MD   Encounter Date: 11/29/2019   PT End of Session - 11/29/19 1106    Visit Number 30    Number of Visits 34    Date for PT Re-Evaluation 12/11/19    Authorization Type BCBS Medicare; $40 copay.  10th visit PN.  No VL    Progress Note Due on Visit 46    PT Start Time 1016    PT Stop Time 1106    PT Time Calculation (min) 50 min    Equipment Utilized During Treatment Other (comment)   rollator, 02 tank   Activity Tolerance Patient tolerated treatment well    Behavior During Therapy WFL for tasks assessed/performed           Past Medical History:  Diagnosis Date  . Adenomatous colon polyp 2007 & 2010    Dr [name]  . Allergy    seasonal  . Arthritis   . Asthma   . Cataract   . Diverticulosis   . 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 Hyperlipidemia   . Hypertension   . IBS (irritable bowel syndrome)   . Legally blind   . MVP (mitral valve prolapse)   . Paroxysmal atrial fibrillation with rapid ventricular response ([place]) 08/29/2017  . Peripheral vascular disease ([place])   . RAD (reactive airway disease)     Past Surgical History:  Procedure Laterality Date  . ABDOMINAL HYSTERECTOMY     with USO for dysfunctionall menses  . APPENDECTOMY    . ARTERY BIOPSY Bilateral 07/21/2017   Procedure: BIOPSY BILATERAL TEMPORAL ARTERY;  Surgeon: [name], MD;  Location: [facility];  Service: Vascular;  Laterality: Bilateral;  . CARDIOVERSION N/A 06/16/2018   Procedure: CARDIOVERSION;  Surgeon: [name], MD;  Location: [facility];  Service: Cardiovascular;  Laterality: N/A;  . CATARACT EXTRACTION W/ INTRAOCULAR LENS IMPLANT  2013   bilateral; Dr [name]  .  COLONOSCOPY  2013   negative , due 2018; Dr [name]  . COLONOSCOPY W/ POLYPECTOMY      X2 ; diverticulosis  . EMBOLECTOMY Right 08/29/2017   Procedure: Right Groin Exploration  ;  Surgeon: Rosetta Posner, MD;  Location: [facility];  Service: Vascular;  Laterality: Right;  . HIP PINNING,CANNULATED Right 03/21/2018  . HIP PINNING,CANNULATED Right 03/21/2018   Procedure: CANNULATED HIP PINNING;  Surgeon: Rod Can, MD;  Location: [facility];  Service: Orthopedics;  Laterality: Right;  . LUMBAR LAMINECTOMY  2000   Dr Vertell Limber  . SHOULDER SURGERY     R shoulder  . TONSILLECTOMY      Vitals:   11/29/19 1024  SpO2: 94%     Subjective Assessment - 11/29/19 1023    Subjective No issues or questions about new exercises.  Vertigo has returned affecting patient's balance.    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, IBS, HTN, HLD, femoral artery thrombosis, diverticulosis, cataract, OA, R shoulder surgery, lumbar laminectomy, R cannulated hip surgery, and embolectomy                   Vestibular Assessment - 11/29/19 1024      Positional Testing   Dix-Hallpike Dix-Hallpike Right;Dix-Hallpike Left  Horizontal Canal Testing Horizontal Canal Right;Horizontal Canal Left      Dix-Hallpike Left   Dix-Hallpike Left Duration 10 seconds    Dix-Hallpike Left Symptoms Upbeat, left rotatory nystagmus      Horizontal Canal Right   Horizontal Canal Right Duration 0    Horizontal Canal Right Symptoms Normal      Horizontal Canal Left   Horizontal Canal Left Duration 0    Horizontal Canal Left Symptoms Normal                     Vestibular Treatment/Exercise - 11/29/19 1033      Vestibular Treatment/Exercise   Vestibular Treatment Provided Canalith Repositioning    Canalith Repositioning Epley Manuever Left       EPLEY MANUEVER LEFT   Number of Reps  1    Overall Response  Symptoms Resolved      RESPONSE DETAILS LEFT L rotary, upbeating nystagmus changed to downbeating nystagmus and mild dizziness.  Treated x 1 with deep head hang with full resolution of symptoms.              Balance Exercises - 11/29/19 1056      Balance Exercises: Standing   Other Standing Exercises Standing without UE support but with wide BOS while performing dynamic UE activity with weighted ball.  Performed 2 sets x 20 reps passing ball L <> RUE, performed 1 set x 10 reps reaching across midline to hand ball to therapist and take back and then performed 1 set of passing ball behind the head to R and then L x 10 reps total.  Performed supervision-min guard but pt did not require support and did not exhibit increased sway or LOB             PT Education - 11/29/19 1106    Education Details plan for next two sessions working towards graduation    Person(s) Educated Patient;Spouse    Methods Explanation    Comprehension Verbalized understanding            PT Short Term Goals - 11/05/19 1115      PT SHORT TERM GOAL #1   Title Pt will demonstrate ability to perform ongoing HEP with supervision    Time 4    Period Weeks    Status Achieved    Target Date 11/11/19      PT SHORT TERM GOAL #2   Title Pt will ambulate up/down ramp and curb with rollator with consistent min A    Baseline min A to ascend, min-mod A to descend    Time 4    Period Weeks    Status Partially Met    Target Date 11/11/19      PT SHORT TERM GOAL #3   Title Pt will increase BERG score to >/= 38/56    Baseline 40/56    Time 4    Period Weeks    Status Achieved    Target Date 11/11/19      PT SHORT TERM GOAL #4   Title Pt will initiate aquatic therapy for balance and gait training    Baseline not able due to supplemental 02    Time 4    Period Weeks    Status Deferred    Target Date 11/11/19             PT Long Term Goals - 10/12/19 1557      PT LONG TERM GOAL #1   Title Pt will demonstrate  ability to  perform final HEP with supervision    Time 8    Period Weeks    Status Revised    Target Date 12/11/19      PT LONG TERM GOAL #2   Title Pt will demonstrate negative positional testing for multiple canals    Time 8    Period Weeks    Status Revised    Target Date 12/11/19      PT LONG TERM GOAL #3   Title Pt will demonstrate ability to stand ~5 minutes without UE support and without leaning against furniture with supervision for pt to return to fixing hair at sink safely    Baseline leans against counter top    Time 8    Period Weeks    Status New    Target Date 12/11/19      PT LONG TERM GOAL #4   Title Pt will demonstrate ability to ambulate 500' outside over pavement and negotiate curb with LRAD and min A    Baseline Min A to ambulate 250', mod A for curb    Time 8    Period Weeks    Status Revised    Target Date 12/11/19      PT LONG TERM GOAL #5   Title Pt will decrease falls risk as indicated by increase in BERG to >/= 40/56    Time 8    Period Weeks    Status Revised    Target Date 12/11/19                 Plan - 11/29/19 1123    Clinical Impression Statement Pt demonstrates return of L posterior canal BPPV in the long and short arm of the canal, treated with 1 CRM and 1 deep head hanging maneuver with resolution of symptoms.  Pt able to participate in balance activities after BPPV treatment and able to perform various UE dynamic movements with weighted ball without LE supported against mat, without UE support and without external support of therapist.  Pt is making excellent progress and will likely be ready for D/C in two more sessions.    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, IBS, HTN, HLD, femoral artery thrombosis, diverticulosis, cataract, OA, R shoulder surgery, lumbar  laminectomy, R cannulated hip surgery, and embolectomy    Examination-Activity Limitations Bed Mobility;Locomotion Level;Stand;Transfers;Stairs    Examination-Participation Restrictions Shop;Community Activity    Rehab Potential Good    PT Frequency 1x / week    PT Duration 8 weeks    PT Treatment/Interventions ADLs/Self Care Home Management;Canalith Repostioning;Gait training;Stair training;Functional mobility training;Therapeutic activities;Therapeutic exercise;Balance training;Neuromuscular re-education;Patient/family education;Vestibular;Aquatic Therapy;DME Instruction    PT Next Visit Plan PT IS COMPLETELY VISUALLY IMPAIRED - USES WHITE CANE.  Continue to work on curb and balance without UE support - dynamic UE movements with weighted ball, reaching forwards and down.  Begin to start checking LTG and finalizing HEP to prepare for D/C.    Consulted and Agree with Plan of Care Patient;Family member/caregiver    Family Member Consulted Husband           Patient will benefit from skilled therapeutic intervention in order to improve the following deficits and impairments:  Decreased balance, Difficulty walking, Dizziness  Visit Diagnosis: Repeated falls  Unsteadiness on feet  Difficulty in walking, not elsewhere classified  Dizziness and giddiness  BPPV (benign paroxysmal positional vertigo), left     Problem List Patient Active Problem List   Diagnosis Date Noted  . Acute gout 09/21/2019  . Vertigo of central origin 02/09/2019  . Other peripheral vertigo, unspecified ear 02/09/2019  . Nonintractable headache 02/09/2019  . Pruritus 01/02/2019  . Hypokalemia 08/09/2018  . Long term (current) use of anticoagulants 08/01/2018  . Pre-operative clearance 08/01/2018  . Closed compression fracture of L4 vertebra ([place]) 07/11/2018  . Bilateral leg edema 07/11/2018  . Lung nodule < 6cm on CT 07/11/2018  . Hypothyroidism (acquired) 04/11/2018  . Fracture of femoral neck, right  ([place]) 03/22/2018  . PAF (paroxysmal atrial fibrillation) ([place]) 03/18/2018  . 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 Herpes zoster without complication 76/16/0737  . Jaw claudication 07/27/2017  . Temporal arteritis ([place]) 07/18/2017  . Vision, loss, sudden, bilateral 07/18/2017  . 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 Osteoarthritis 06/25/2016  . Anxiety 06/25/2016  . Back pain 07/18/2015  . 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 Carotid bruit present 06/05/2010  . Osteoporosis 06/03/2008  . History of colonic polyps 06/03/2008  . INTENTION TREMOR 12/06/2007  . IBS 10/16/2007  . Hyperlipidemia 06/06/2007  . Essential hypertension 06/06/2007  . Chronic obstructive airway disease with asthma ([place]) 02/23/2007    Rico Junker, PT, DPT 11/29/19    11:27 AM    [facility] [address] [place], [place], [address] Phone: [phone]   Fax:  [phone]  Name: YAZMYN VALBUENA MRN: [id number] Date of Birth: [birth year]

## 2019-12-01 ENCOUNTER — Other Ambulatory Visit (HOSPITAL_COMMUNITY)
Admission: RE | Admit: 2019-12-01 | Discharge: 2019-12-01 | Disposition: A | Discharge status: Home / Self Care | Payer: Medicare Other | Source: Ambulatory Visit | Attending: Primary Care | Admitting: Primary Care

## 2019-12-01 DIAGNOSIS — Z01812 Encounter for preprocedural laboratory examination: Secondary | ICD-10-CM | POA: Insufficient documentation

## 2019-12-01 DIAGNOSIS — Z20822 Contact with and (suspected) exposure to covid-19: Secondary | ICD-10-CM | POA: Insufficient documentation

## 2019-12-01 LAB — SARS CORONAVIRUS 2 (TAT 6-24 HRS): SARS Coronavirus 2: NEGATIVE

## 2019-12-05 ENCOUNTER — Ambulatory Visit: Payer: Medicare Other | Admitting: Primary Care

## 2019-12-05 ENCOUNTER — Other Ambulatory Visit: Payer: Self-pay

## 2019-12-05 ENCOUNTER — Ambulatory Visit (INDEPENDENT_AMBULATORY_CARE_PROVIDER_SITE_OTHER): Payer: Medicare Other | Admitting: Internal Medicine

## 2019-12-05 DIAGNOSIS — R0602 Shortness of breath: Secondary | ICD-10-CM | POA: Diagnosis not present

## 2019-12-05 LAB — PULMONARY FUNCTION TEST
DL/VA % pred: 60 %
DL/VA: 2.47 ml/min/mmHg/L
DLCO cor % pred: 48 %
DLCO cor: 9.36 ml/min/mmHg
DLCO unc % pred: 48 %
DLCO unc: 9.36 ml/min/mmHg
FEF 25-75 Post: 1.32 L/sec
FEF 25-75 Pre: 1.03 L/sec
FEF2575-%Change-Post: 28 %
FEF2575-%Pred-Post: 79 %
FEF2575-%Pred-Pre: 62 %
FEV1-%Change-Post: 5 %
FEV1-%Pred-Post: 78 %
FEV1-%Pred-Pre: 74 %
FEV1-Post: 1.66 L
FEV1-Pre: 1.57 L
FEV1FVC-%Change-Post: 0 %
FEV1FVC-%Pred-Pre: 95 %
FEV6-%Change-Post: 5 %
FEV6-%Pred-Post: 86 %
FEV6-%Pred-Pre: 82 %
FEV6-Post: 2.32 L
FEV6-Pre: 2.2 L
FEV6FVC-%Change-Post: 0 %
FEV6FVC-%Pred-Post: 104 %
FEV6FVC-%Pred-Pre: 105 %
FVC-%Change-Post: 6 %
FVC-%Pred-Post: 83 %
FVC-%Pred-Pre: 78 %
FVC-Post: 2.34 L
FVC-Pre: 2.2 L
Post FEV1/FVC ratio: 71 %
Post FEV6/FVC ratio: 99 %
Pre FEV1/FVC ratio: 71 %
Pre FEV6/FVC Ratio: 100 %
RV % pred: 121 %
RV: 2.78 L
TLC % pred: 98 %
TLC: 4.98 L

## 2019-12-05 NOTE — Progress Notes (Signed)
Full PFT performed today. °

## 2019-12-06 ENCOUNTER — Ambulatory Visit: Payer: Medicare Other | Admitting: Physical Therapy

## 2019-12-06 DIAGNOSIS — R262 Difficulty in walking, not elsewhere classified: Secondary | ICD-10-CM | POA: Diagnosis not present

## 2019-12-06 DIAGNOSIS — H8112 Benign paroxysmal vertigo, left ear: Secondary | ICD-10-CM

## 2019-12-06 DIAGNOSIS — R296 Repeated falls: Secondary | ICD-10-CM

## 2019-12-06 DIAGNOSIS — R2681 Unsteadiness on feet: Secondary | ICD-10-CM

## 2019-12-06 DIAGNOSIS — R42 Dizziness and giddiness: Secondary | ICD-10-CM

## 2019-12-06 NOTE — Therapy (Signed)
[facility] [address] [place], [place], [address] Phone: [phone]   Fax:  [phone]  Physical Therapy Treatment  Patient Details  Name: Crystal [name] MRN: [id number] Date of Birth: 1945-12-11 Referring Provider (PT): [name], MD   Encounter Date: 12/06/2019   PT End of Session - 12/06/19 1234    Visit Number 31    Number of Visits 34    Date for PT Re-Evaluation 12/11/19    Authorization Type BCBS Medicare; $40 copay.  10th visit PN.  No VL    Progress Note Due on Visit 60    PT Start Time 1018    PT Stop Time 1100    PT Time Calculation (min) 42 min    Equipment Utilized During Treatment Other (comment)   rollator, 02 tank   Activity Tolerance Patient tolerated treatment well    Behavior During Therapy WFL for tasks assessed/performed           Past Medical History:  Diagnosis Date  . Adenomatous colon polyp 2007 & 2010    Dr [name]  . Allergy    seasonal  . Arthritis   . Asthma   . Cataract   . Diverticulosis   . 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 Hyperlipidemia   . Hypertension   . IBS (irritable bowel syndrome)   . Legally blind   . MVP (mitral valve prolapse)   . Paroxysmal atrial fibrillation with rapid ventricular response ([place]) 08/29/2017  . Peripheral vascular disease ([place])   . RAD (reactive airway disease)     Past Surgical History:  Procedure Laterality Date  . ABDOMINAL HYSTERECTOMY     with USO for dysfunctionall menses  . APPENDECTOMY    . ARTERY BIOPSY Bilateral 07/21/2017   Procedure: BIOPSY BILATERAL TEMPORAL ARTERY;  Surgeon: [name], MD;  Location: [facility];  Service: Vascular;  Laterality: Bilateral;  . CARDIOVERSION N/A 06/16/2018   Procedure: CARDIOVERSION;  Surgeon: [name], MD;  Location: [facility];  Service: Cardiovascular;  Laterality: N/A;  . CATARACT EXTRACTION W/ INTRAOCULAR LENS IMPLANT  2013   bilateral; Dr [name]  .  COLONOSCOPY  2013   negative , due 2018; Dr [name]  . COLONOSCOPY W/ POLYPECTOMY      X2 ; diverticulosis  . EMBOLECTOMY Right 08/29/2017   Procedure: Right Groin Exploration  ;  Surgeon: Rosetta Posner, MD;  Location: [facility];  Service: Vascular;  Laterality: Right;  . HIP PINNING,CANNULATED Right 03/21/2018  . HIP PINNING,CANNULATED Right 03/21/2018   Procedure: CANNULATED HIP PINNING;  Surgeon: Rod Can, MD;  Location: [facility];  Service: Orthopedics;  Laterality: Right;  . LUMBAR LAMINECTOMY  2000   Dr Vertell Limber  . SHOULDER SURGERY     R shoulder  . TONSILLECTOMY      There were no vitals filed for this visit.   Subjective Assessment - 12/06/19 1025    Subjective Pt reports continued dizziness with rolling; lightheadedness when standing and ambulating.  No issues with exercises, up on toes is difficult.  Had breathing test yesterday, no results yet.  Some mild back spasms today.    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, IBS, HTN, HLD, femoral artery thrombosis, diverticulosis, cataract, OA, R shoulder surgery, lumbar laminectomy, R cannulated hip surgery, and embolectomy                   Vestibular  Assessment - 12/06/19 1045      Positional Testing   Dix-Hallpike Dix-Hallpike Right;Dix-Hallpike Left    Horizontal Canal Testing Horizontal Canal Right;Horizontal Canal Left      Dix-Hallpike Right   Dix-Hallpike Right Duration 0    Dix-Hallpike Right Symptoms No nystagmus      Dix-Hallpike Left   Dix-Hallpike Left Duration 0    Dix-Hallpike Left Symptoms No nystagmus      Horizontal Canal Right   Horizontal Canal Right Duration 2 seconds    Horizontal Canal Right Symptoms Geotrophic      Horizontal Canal Left   Horizontal Canal Left Duration 3 seconds    Horizontal Canal Left Symptoms Geotrophic   worse to L                   [facility] Adult PT Treatment/Exercise - 12/06/19  1231      Balance   Balance Assessed Yes      Dynamic Standing Balance   Dynamic Standing - Balance Support No upper extremity supported    Dynamic Standing - Level of Assistance 5: Stand by assistance    Dynamic Standing - Balance Activities Reaching for objects    Reaching for objects comments: with 2.2lb medicine ball peformed reaching forwards out of BOS to retrieve and put ball back on stool in front of patient about 2-67f in front x 8 reps; second set of reps added in trunk rotation to the L or R x 3 reps to each side.  Also performed reaching down towards the floor about 2 feet for ball x 6 reps - pt requested to stop due to back spasms            Vestibular Treatment/Exercise - 12/06/19 1047      Vestibular Treatment/Exercise   Vestibular Treatment Provided Canalith Repositioning    Canalith Repositioning Canal Roll Left      Canal Roll Left   Number of Reps  1    Overall Response  Improved Symptoms                 PT Education - 12/06/19 1234    Education Details plan for D/C next session if dizziness is resolved    Person(s) Educated Patient;Spouse    Methods Explanation    Comprehension Verbalized understanding            PT Short Term Goals - 11/05/19 1115      PT SHORT TERM GOAL #1   Title Pt will demonstrate ability to perform ongoing HEP with supervision    Time 4    Period Weeks    Status Achieved    Target Date 11/11/19      PT SHORT TERM GOAL #2   Title Pt will ambulate up/down ramp and curb with rollator with consistent min A    Baseline min A to ascend, min-mod A to descend    Time 4    Period Weeks    Status Partially Met    Target Date 11/11/19      PT SHORT TERM GOAL #3   Title Pt will increase BERG score to >/= 38/56    Baseline 40/56    Time 4    Period Weeks    Status Achieved    Target Date 11/11/19      PT SHORT TERM GOAL #4   Title Pt will initiate aquatic therapy for balance and gait training    Baseline not able due  to supplemental 02  Time 4    Period Weeks    Status Deferred    Target Date 11/11/19             PT Long Term Goals - 10/12/19 1557      PT LONG TERM GOAL #1   Title Pt will demonstrate ability to perform final HEP with supervision    Time 8    Period Weeks    Status Revised    Target Date 12/11/19      PT LONG TERM GOAL #2   Title Pt will demonstrate negative positional testing for multiple canals    Time 8    Period Weeks    Status Revised    Target Date 12/11/19      PT LONG TERM GOAL #3   Title Pt will demonstrate ability to stand ~5 minutes without UE support and without leaning against furniture with supervision for pt to return to fixing hair at sink safely    Baseline leans against counter top    Time 8    Period Weeks    Status New    Target Date 12/11/19      PT LONG TERM GOAL #4   Title Pt will demonstrate ability to ambulate 500' outside over pavement and negotiate curb with LRAD and min A    Baseline Min A to ambulate 250', mod A for curb    Time 8    Period Weeks    Status Revised    Target Date 12/11/19      PT LONG TERM GOAL #5   Title Pt will decrease falls risk as indicated by increase in BERG to >/= 40/56    Time 8    Period Weeks    Status Revised    Target Date 12/11/19                 Plan - 12/06/19 1234    Clinical Impression Statement Pt still reporting mild dizziness with rolling; pt experienced mild vertigo with rolling today, worse to L, performed one BBQ roll for L horizontal canal with improvement in symptoms.  Continued standing balance training with dynamic UE movement focusing on reaching forwards and down towards the floor with pt demonstrating good use of hip strategy for balance, no LOB.  Pt only limited by back spasms today.  Plan to assess LTG and D/C next visit if dizziness is resolved.    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, IBS, HTN, HLD, femoral artery thrombosis, diverticulosis, cataract, OA, R shoulder surgery, lumbar laminectomy, R cannulated hip surgery, and embolectomy    Examination-Activity Limitations Bed Mobility;Locomotion Level;Stand;Transfers;Stairs    Examination-Participation Restrictions Shop;Community Activity    Rehab Potential Good    PT Frequency 1x / week    PT Duration 8 weeks    PT Treatment/Interventions ADLs/Self Care Home Management;Canalith Repostioning;Gait training;Stair training;Functional mobility training;Therapeutic activities;Therapeutic exercise;Balance training;Neuromuscular re-education;Patient/family education;Vestibular;Aquatic Therapy;DME Instruction    PT Next Visit Plan check LTG, D/C if dizziness resolved    Consulted and Agree with Plan of Care Patient;Family member/caregiver    Family Member Consulted Husband           Patient will benefit from skilled therapeutic intervention in order to improve the following deficits and impairments:  Decreased balance, Difficulty walking, Dizziness  Visit Diagnosis: Repeated falls  Unsteadiness on feet  Difficulty in walking, not elsewhere classified  Dizziness and giddiness  BPPV (benign paroxysmal positional vertigo), left     Problem List Patient Active Problem List   Diagnosis Date Noted  . Acute gout 09/21/2019  . Vertigo of central origin 02/09/2019  . Other peripheral vertigo, unspecified ear 02/09/2019  . Nonintractable headache 02/09/2019  . Pruritus 01/02/2019  . Hypokalemia 08/09/2018  . Long term (current) use of anticoagulants 08/01/2018  . Pre-operative clearance 08/01/2018  . Closed compression fracture of L4 vertebra ([facility]) 07/11/2018  . Bilateral leg edema 07/11/2018  . Lung nodule < 6cm on CT 07/11/2018  . Hypothyroidism (acquired) 04/11/2018  . Fracture of femoral neck, right ([facility])  03/22/2018  . PAF (paroxysmal atrial fibrillation) ([place]) 03/18/2018  . Femoral artery thrombosis, right ([facility]) 09/01/2017  . Herpes zoster without complication 07/05/2409  . Jaw claudication 07/27/2017  . Temporal arteritis ([facility]) 07/18/2017  . Vision, loss, sudden, bilateral 07/18/2017  . 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 Osteoarthritis 06/25/2016  . Anxiety 06/25/2016  . Back pain 07/18/2015  . 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 Carotid bruit present 06/05/2010  . Osteoporosis 06/03/2008  . History of colonic polyps 06/03/2008  . INTENTION TREMOR 12/06/2007  . IBS 10/16/2007  . Hyperlipidemia 06/06/2007  . Essential hypertension 06/06/2007  . Chronic obstructive airway disease with asthma ([place]) 02/23/2007    Rico Junker, PT, DPT 12/06/19    12:38 PM    [facility] [address] [place], [place], [address] Phone: [phone]   Fax:  [phone]  Name: Crystal [name] MRN: [id number] Date of Birth: Jul 20, 1945

## 2019-12-09 ENCOUNTER — Ambulatory Visit (HOSPITAL_COMMUNITY)
Admission: EM | Admit: 2019-12-09 | Discharge: 2019-12-09 | Disposition: A | Discharge status: Home / Self Care | Payer: Medicare Other | Attending: Family Medicine | Admitting: Family Medicine

## 2019-12-09 ENCOUNTER — Other Ambulatory Visit: Payer: Self-pay

## 2019-12-09 ENCOUNTER — Other Ambulatory Visit: Payer: Self-pay | Admitting: Internal Medicine

## 2019-12-09 ENCOUNTER — Telehealth (HOSPITAL_COMMUNITY): Payer: Self-pay | Admitting: Family Medicine

## 2019-12-09 ENCOUNTER — Encounter (HOSPITAL_COMMUNITY): Payer: Self-pay

## 2019-12-09 DIAGNOSIS — M109 Gout, unspecified: Secondary | ICD-10-CM | POA: Diagnosis not present

## 2019-12-09 DIAGNOSIS — M25572 Pain in left ankle and joints of left foot: Secondary | ICD-10-CM

## 2019-12-09 MED ORDER — PREDNISONE 10 MG (21) PO TBPK
ORAL_TABLET | Freq: Every day | ORAL | 0 refills | Status: DC
Start: 2019-12-09 — End: 2019-12-09

## 2019-12-09 MED ORDER — PREDNISONE 10 MG (21) PO TBPK: ORAL_TABLET | ORAL | 0 refills | Status: DC

## 2019-12-09 NOTE — Discharge Instructions (Addendum)
I have sent in a prednisone taper for you to take for 6 days. 6 tablets on day one, 5 tablets on day two, 4 tablets on day three, 3 tablets on day four, 2 tablets on day five, and 1 tablet on day six.  I have included information on gout and foods that can contribute to pain

## 2019-12-09 NOTE — ED Triage Notes (Signed)
Pt presents today with left foot pain, states it is gout. Pt states she has experienced similar pain in the past, given prednisone, that relieved pain. Pt states pain is in big toe, and on pad of foot. Pt has been treating with tylenol and tramadol with out relief.

## 2019-12-09 NOTE — ED Provider Notes (Signed)
[facility]   [id number] 12/09/19 Arrival Time: 9323  FT:DDUKG PAIN  SUBJECTIVE: History from: patient. Crystal [name] is a 74 y.o. female complains of left foot pain that began 2 days ago.  Reports that the pain is in her left great toe.  Reports that she has had this pain before and it was gout.  Reports that she has been treated with prednisone in the past and not NSAIDs due to her being on long-term blood thinners.  Reports that the last flareup was about 2 years ago.  Denies a precipitating event or specific injury.  Has taken Tylenol without relief. Symptoms are made worse with activity and palpation.  Denies similar symptoms in the past.  Denies fever, chills, ecchymosis, effusion, weakness, numbness and tingling, saddle paresthesias, loss of bowel or bladder function.      ROS: As per HPI.  All other pertinent ROS negative.     Past Medical History:  Diagnosis Date  . Adenomatous colon polyp 2007 & 2010    Dr [name]  . Allergy    seasonal  . Arthritis   . Asthma   . Cataract   . Diverticulosis   . Femoral artery thrombosis, right ([facility]) 09/01/2017  . Hyperlipidemia   . Hypertension   . IBS (irritable bowel syndrome)   . Legally blind   . MVP (mitral valve prolapse)   . Paroxysmal atrial fibrillation with rapid ventricular response ([facility]) 08/29/2017  . Peripheral vascular disease ([facility])   . RAD (reactive airway disease)    Past Surgical History:  Procedure Laterality Date  . ABDOMINAL HYSTERECTOMY     with USO for dysfunctionall menses  . APPENDECTOMY    . ARTERY BIOPSY Bilateral 07/21/2017   Procedure: BIOPSY BILATERAL TEMPORAL ARTERY;  Surgeon: [name], MD;  Location: [facility];  Service: Vascular;  Laterality: Bilateral;  . CARDIOVERSION N/A 06/16/2018   Procedure: CARDIOVERSION;  Surgeon: [name], MD;  Location: [facility];  Service: Cardiovascular;  Laterality: N/A;  . CATARACT EXTRACTION W/ INTRAOCULAR LENS IMPLANT  2013   bilateral; Dr  [name]  . COLONOSCOPY  2013   negative , due 2018; Dr [name]  . COLONOSCOPY W/ POLYPECTOMY      X2 ; diverticulosis  . EMBOLECTOMY Right 08/29/2017   Procedure: Right Groin Exploration  ;  Surgeon: Rosetta Posner, MD;  Location: [facility];  Service: Vascular;  Laterality: Right;  . HIP PINNING,CANNULATED Right 03/21/2018  . HIP PINNING,CANNULATED Right 03/21/2018   Procedure: CANNULATED HIP PINNING;  Surgeon: Rod Can, MD;  Location: [facility];  Service: Orthopedics;  Laterality: Right;  . LUMBAR LAMINECTOMY  2000   Dr Vertell Limber  . SHOULDER SURGERY     R shoulder  . TONSILLECTOMY     Allergies  Allergen Reactions  . Simvastatin     myalgias   No current facility-administered medications on file prior to encounter.   Current Outpatient Medications on File Prior to Encounter  Medication Sig Dispense Refill  . albuterol (VENTOLIN HFA) 108 (90 Base) MCG/ACT inhaler INHALE ONE PUFF BY MOUTH TWICE A DAY AS NEEDED 8.5 g 1  . Calcium Citrate-Vitamin D (CALCIUM + D PO) Take 1 tablet by mouth daily.    . diclofenac Sodium (VOLTAREN) 1 % GEL Voltaren 1 % topical gel    . diltiazem (CARDIZEM CD) 180 MG 24 hr capsule Take 1 capsule (180 mg total) by mouth daily. 90 capsule 3  . ELIQUIS 5 MG TABS tablet TAKE 1 TABLET BY  MOUTH TWO TIMES A DAY 180 tablet 1  . furosemide (LASIX) 40 MG tablet TAKE ONE TABLET BY MOUTH DAILY 90 tablet 0  . gabapentin (NEURONTIN) 300 MG capsule Take 2 capsules (600 mg total) by mouth at bedtime. 180 capsule 1  . KLOR-CON M20 20 MEQ tablet TAKE TWO TABLETS BY MOUTH DAILY 180 tablet 0  . levothyroxine (SYNTHROID) 50 MCG tablet Take 1 tablet (50 mcg total) by mouth daily. 90 tablet 3  . LORazepam (ATIVAN) 1 MG tablet TAKE ONE TABLET BY MOUTH EVERY NIGHT AT BEDTIME 30 tablet 4  . methocarbamol (ROBAXIN) 500 MG tablet TAKE 1 TABLET BY MOUTH EVERY 6 HOURS AS NEEDED FOR MUSCLE SPASMS 120 tablet 0  . montelukast (SINGULAIR) 10 MG tablet TAKE ONE TABLET BY MOUTH EVERY NIGHT AT BEDTIME  90 tablet 0  . SYMBICORT 160-4.5 MCG/ACT inhaler INHALE ONE TO TWO PUFFS BY MOUTH EVERY 12 HOURS, THEN GARGLE AND SPIT AFTER USE 10.2 g 9  . theophylline (UNIPHYL) 400 MG 24 hr tablet TAKE 1/2 TABLET BY MOUTH TWO TIMES A DAY 90 tablet 0  . traMADol (ULTRAM) 50 MG tablet Take 1 tablet (50 mg total) by mouth every 6 (six) hours as needed for moderate pain. 90 tablet 0  . amoxicillin (AMOXIL) 500 MG tablet Take 4 tablets by mouth as needed. Pt take 4 tablets prior dental work    . Cholecalciferol (VITAMIN D3) 1000 units CAPS Take 1 capsule (1,000 Units total) by mouth daily with lunch. 60 capsule 0   Social History   Socioeconomic History  . Marital status: Married    Spouse name: Not on file  . Number of children: Not on file  . Years of education: Not on file  . Highest education level: Not on file  Occupational History  . Occupation: retired  Tobacco Use  . Smoking status: Former Smoker    Packs/day: 1.00    Years: 23.00    Pack years: 23.00    Quit date: 06/14/1989    Years since quitting: 30.5  . Smokeless tobacco: Never Used  . Tobacco comment: smoked 1961-1991 , up to < 1 ppd  Vaping Use  . Vaping Use: Never used  Substance and Sexual Activity  . Alcohol use: Yes    Alcohol/week: 14.0 standard drinks    Types: 14 Glasses of wine per week    Comment: Wine  . Drug use: No  . Sexual activity: Not on file  Other Topics Concern  . Not on file  Social History Narrative  . Not on file   Social Determinants of Health   Financial Resource Strain:   . Difficulty of Paying Living Expenses:   Food Insecurity:   . Worried About Charity fundraiser in the Last Year:   . Arboriculturist in the Last Year:   Transportation Needs:   . Film/video editor (Medical):   Marland Kitchen Lack of Transportation (Non-Medical):   Physical Activity:   . Days of Exercise per Week:   . Minutes of Exercise per Session:   Stress:   . Feeling of Stress :   Social Connections:   . Frequency of  Communication with Friends and Family:   . Frequency of Social Gatherings with Friends and Family:   . Attends Religious Services:   . Active Member of Clubs or Organizations:   . Attends Archivist Meetings:   Marland Kitchen Marital Status:   Intimate Partner Violence:   . Fear of Current or Ex-Partner:   .  Emotionally Abused:   Marland Kitchen Physically Abused:   . Sexually Abused:    Family History  Problem Relation Age of Onset  . Heart attack Brother 51  . Parkinsonism Mother   . Stroke Mother   . Heart attack Father        pre 34  . Diabetes Sister   . COPD Sister        X 3  . Other Son        suicide 2016  . Colon cancer Neg Hx   . Cancer Neg Hx   . Esophageal cancer Neg Hx   . Liver cancer Neg Hx   . Pancreatic cancer Neg Hx   . Rectal cancer Neg Hx   . Stomach cancer Neg Hx     OBJECTIVE:  Vitals:   12/09/19 1209  Pulse: 75  Resp: 16  Temp: 98.6 F (37 C)  TempSrc: Oral  SpO2: 95%    General appearance: ALERT; in no acute distress.  Head: NCAT Lungs: Normal respiratory effort CV: Pedal pulses 2+ bilaterally. Cap refill < 2 seconds Musculoskeletal:  Inspection: Skin warm, dry, clear and intact without obvious effusion, or ecchymosis.  Erythema and tenderness to left great toe Palpation: Nontender to palpation ROM: FROM active and passive Skin: warm and dry Neurologic: In wheelchair with oxygen via nasal cannula sensation intact about the upper/ lower extremities Psychological: alert and cooperative; normal mood and affect  DIAGNOSTIC STUDIES:  No results found.   ASSESSMENT & PLAN:  1. Pain of joint of left ankle and foot   2. Acute gout of left foot, unspecified cause     Meds ordered this encounter  Medications  . predniSONE (STERAPRED UNI-PAK 21 TAB) 10 MG (21) TBPK tablet    Sig: Take by mouth daily for 6 days. Take 6 tablets on day 1, 5 tablets on day 2, 4 tablets on day 3, 3 tablets on day 4, 2 tablets on day 5, 1 tablet on day 6    Dispense:  21  tablet    Refill:  0    Order Specific Question:   Supervising Provider    Answer:   Chase Picket [[id number]]   Discussed with patient how to take a prednisone taper Included information on gout and gout diet Continue conservative management of rest, ice, and gentle stretches Take Tylenol as needed for pain relief  Follow up with PCP if symptoms persist Return or go to the ER if you have any new or worsening symptoms (fever, chills, chest pain, abdominal pain, changes in bowel or bladder habits, pain radiating into lower legs)   Reviewed expectations re: course of current medical issues. Questions answered. Outlined signs and symptoms indicating need for more acute intervention. Patient verbalized understanding. After Visit Summary given.       Faustino Congress, NP 12/09/19 1242

## 2019-12-09 NOTE — Telephone Encounter (Signed)
Med did not get sent through [facility]ribe. Re sent

## 2019-12-10 ENCOUNTER — Telehealth: Payer: Self-pay

## 2019-12-10 DIAGNOSIS — M25572 Pain in left ankle and joints of left foot: Secondary | ICD-10-CM

## 2019-12-10 MED ORDER — PREDNISONE 10 MG (21) PO TBPK: ORAL_TABLET | ORAL | 0 refills | Status: DC

## 2019-12-10 NOTE — Telephone Encounter (Signed)
Sent to pharmacy 

## 2019-12-10 NOTE — Telephone Encounter (Signed)
Per [facility], Caller states his wife has been experiencing gout in left foot toe pain. Caller would like a medication called in.   Advised to see PCP within 4 hours.  Patient went to ED on 12/09/2019.

## 2019-12-13 ENCOUNTER — Ambulatory Visit: Payer: Medicare Other | Attending: Internal Medicine | Admitting: Physical Therapy

## 2019-12-13 ENCOUNTER — Other Ambulatory Visit: Payer: Self-pay

## 2019-12-13 ENCOUNTER — Encounter: Payer: Self-pay | Admitting: Physical Therapy

## 2019-12-13 DIAGNOSIS — R296 Repeated falls: Secondary | ICD-10-CM | POA: Diagnosis not present

## 2019-12-13 DIAGNOSIS — H8112 Benign paroxysmal vertigo, left ear: Secondary | ICD-10-CM | POA: Diagnosis not present

## 2019-12-13 DIAGNOSIS — R262 Difficulty in walking, not elsewhere classified: Secondary | ICD-10-CM | POA: Insufficient documentation

## 2019-12-13 DIAGNOSIS — R2681 Unsteadiness on feet: Secondary | ICD-10-CM

## 2019-12-13 DIAGNOSIS — R42 Dizziness and giddiness: Secondary | ICD-10-CM | POA: Diagnosis not present

## 2019-12-13 NOTE — Therapy (Signed)
[facility] [address] [place], [place], [address] Phone: [phone]   Fax:  [phone]  Physical Therapy Treatment and D/C Summary  Patient Details  Name: Crystal [name] MRN: [id number] Date of Birth: 1945/11/15 Referring Provider (PT): [name], MD   Encounter Date: 12/13/2019   PT End of Session - 12/13/19 1746    Visit Number 32    Number of Visits 34    Date for PT Re-Evaluation 12/11/19    Authorization Type BC[employer] Medicare; $40 copay.  10th visit PN.  No VL    Progress Note Due on Visit 67    PT Start Time 1018    PT Stop Time 1108    PT Time Calculation (min) 50 min    Equipment Utilized During Treatment Other (comment)   rollator, 02 tank   Activity Tolerance Patient tolerated treatment well    Behavior During Therapy WFL for tasks assessed/performed           Past Medical History:  Diagnosis Date  . Adenomatous colon polyp 2007 & 2010    Dr [name]  . Allergy    seasonal  . Arthritis   . Asthma   . Cataract   . Diverticulosis   . 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 Hyperlipidemia   . Hypertension   . IBS (irritable bowel syndrome)   . Legally blind   . MVP (mitral valve prolapse)   . Paroxysmal atrial fibrillation with rapid ventricular response ([place]) 08/29/2017  . Peripheral vascular disease ([place])   . RAD (reactive airway disease)     Past Surgical History:  Procedure Laterality Date  . ABDOMINAL HYSTERECTOMY     with USO for dysfunctionall menses  . APPENDECTOMY    . ARTERY BIOPSY Bilateral 07/21/2017   Procedure: BIOPSY BILATERAL TEMPORAL ARTERY;  Surgeon: [name], MD;  Location: [facility];  Service: Vascular;  Laterality: Bilateral;  . CARDIOVERSION N/A 06/16/2018   Procedure: CARDIOVERSION;  Surgeon: [name], MD;  Location: [facility];  Service: Cardiovascular;  Laterality: N/A;  . CATARACT EXTRACTION W/ INTRAOCULAR LENS IMPLANT  2013    bilateral; Dr [name]  . COLONOSCOPY  2013   negative , due 2018; Dr [name]  . COLONOSCOPY W/ POLYPECTOMY      X2 ; diverticulosis  . EMBOLECTOMY Right 08/29/2017   Procedure: Right Groin Exploration  ;  Surgeon: Rosetta Posner, MD;  Location: [facility];  Service: Vascular;  Laterality: Right;  . HIP PINNING,CANNULATED Right 03/21/2018  . HIP PINNING,CANNULATED Right 03/21/2018   Procedure: CANNULATED HIP PINNING;  Surgeon: Rod Can, MD;  Location: [facility];  Service: Orthopedics;  Laterality: Right;  . LUMBAR LAMINECTOMY  2000   Dr Vertell Limber  . SHOULDER SURGERY     R shoulder  . TONSILLECTOMY      There were no vitals filed for this visit.   Subjective Assessment - 12/13/19 1023    Subjective Gout has returned in foot.  Is on Prednisone.  Still having muscle spasms in back.  Still having a little bit of dizziness in the bed but it resolves quickly.  Exercises are going okay but can't do walking on toes with gout right now.    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, IBS, HTN, HLD, femoral artery thrombosis, diverticulosis, cataract, OA, R shoulder surgery, lumbar laminectomy, R cannulated hip surgery, and embolectomy      Prior Function   Level of Independence Independent with household mobility with device;Independent with basic  ADLs;Requires assistive device for independence;Independent with transfers      Observation/Other Assessments   Focus on Therapeutic Outcomes (FOTO)  N/A      Ambulation/Gait   Ambulation/Gait Yes    Ambulation/Gait Assistance 4: Min guard    Ambulation/Gait Assistance Details with rollator carrying 02 tank, performed turns to L and R, min guard to guide around gym.  Mild dizziness with turns    Ambulation Distance (Feet) 500 Feet    Assistive device Rollator    Ambulation Surface Level;Indoor    Curb 4: Min assist;3: Mod assist    Curb Details (indicate cue type and reason) inside curb, min-mod to lift and guide rollator, min A for balance and cues for sequencing      Berg Balance Test   Sit to Stand Able to stand without using hands and stabilize independently    Standing Unsupported Able to stand safely 2 minutes    Sitting with Back Unsupported but Feet Supported on Floor or Stool Able to sit safely and securely 2 minutes    Stand to Sit Sits safely with minimal use of hands    Transfers Able to transfer safely, definite need of hands    Standing Unsupported with Eyes Closed Able to stand 10 seconds safely    Standing Unsupported with Feet Together Able to place feet together independently and stand for 1 minute with supervision    From Standing, Reach Forward with Outstretched Arm Can reach confidently >25 cm (10")    From Standing Position, Pick up Object from Orient to pick up shoe safely and easily    From Standing Position, Turn to Look Behind Over each Shoulder Looks behind from both sides and weight shifts well    Turn 360 Degrees Able to turn 360 degrees safely but slowly    Standing Unsupported, Alternately Place Feet on Step/Stool Able to complete 4 steps without aid or supervision    Standing Unsupported, One Foot in Front Able to take small step independently and hold 30 seconds    Standing on One Leg Tries to lift leg/unable to hold 3 seconds but remains standing  independently    Total Score 45    Berg comment: 45/56 significant falls risk               Vestibular Assessment - 12/13/19 1055      Positional Testing   Dix-Hallpike Dix-Hallpike Right;Dix-Hallpike Left    Horizontal Canal Testing Horizontal Canal Right;Horizontal Canal Left      Dix-Hallpike Left   Dix-Hallpike Left Duration 5 seconds    Dix-Hallpike Left Symptoms Downbeat Nystagmus   deep head hang     Horizontal Canal Right   Horizontal Canal Right Duration 2 seconds  Horizontal Canal Right Symptoms Other (comment)   downbeat     Horizontal Canal Left   Horizontal Canal Left Duration 2 seconds    Horizontal Canal Left Symptoms Other (comment)   downbeat                    Vestibular Treatment/Exercise - 12/13/19 1104      Vestibular Treatment/Exercise   Vestibular Treatment Provided Canalith Repositioning    Canalith Repositioning Comment      OTHER   Comment Performed Deep head hanging maneuver x1 with improvement in symptoms.                 PT Education - 12/13/19 1746    Education Details progress made, D/C today    Person(s) Educated Patient;Spouse    Methods Explanation    Comprehension Verbalized understanding            PT Short Term Goals - 11/05/19 1115      PT SHORT TERM GOAL #1   Title Pt will demonstrate ability to perform ongoing HEP with supervision    Time 4    Period Weeks    Status Achieved    Target Date 11/11/19      PT SHORT TERM GOAL #2   Title Pt will ambulate up/down ramp and curb with rollator with consistent min A    Baseline min A to ascend, min-mod A to descend    Time 4    Period Weeks    Status Partially Met    Target Date 11/11/19      PT SHORT TERM GOAL #3   Title Pt will increase BERG score to >/= 38/56    Baseline 40/56    Time 4    Period Weeks    Status Achieved    Target Date 11/11/19      PT SHORT TERM GOAL #4   Title Pt will initiate aquatic therapy for balance and gait  training    Baseline not able due to supplemental 02    Time 4    Period Weeks    Status Deferred    Target Date 11/11/19             PT Long Term Goals - 12/13/19 1104      PT LONG TERM GOAL #1   Title Pt will demonstrate ability to perform final HEP with supervision    Time 8    Period Weeks    Status Achieved      PT LONG TERM GOAL #2   Title Pt will demonstrate negative positional testing for multiple canals    Time 8    Period Weeks    Status Partially Met      PT LONG TERM GOAL #3   Title Pt will demonstrate ability to stand ~5 minutes without UE support and without leaning against furniture with supervision for pt to return to fixing hair at sink safely    Baseline leans against counter top    Time 8    Period Weeks    Status Achieved      PT LONG TERM GOAL #4   Title Pt will demonstrate ability to ambulate 500' outside over pavement and negotiate curb with LRAD and min A    Baseline Min A to ambulate 250', mod A for curb    Time 8    Period Weeks    Status Partially Met      PT LONG TERM GOAL #5  Title Pt will decrease falls risk as indicated by increase in BERG to >/= 40/56    Baseline 45/56    Time 8    Period Weeks    Status Achieved                 Plan - 12/13/19 1746    Clinical Impression Statement Patient has made excellent progress and has met 3/5 LTG, partially meeting other two goals.  She demonstrates improved standing balance and decreased falls risk and is now able to stand without UE support or external support and utilize UE for functional activities without LOB.  She is ambulating safely with rollator with assistance to guide only and is performing HEP with assistance of family.  Pt was not able to do aquatic therapy due to supplemental 02.  Pt's vertigo had resolved but returned recently and required three treatment sessions to resolve again.  Pt continues to require mod A to safely perform curb negotiation with rollator but will  continue to have family assistance when ambulating indoors and outdoors.  Pt is safe for D/C today.    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, IBS, HTN, HLD, femoral artery thrombosis, diverticulosis, cataract, OA, R shoulder surgery, lumbar laminectomy, R cannulated hip surgery, and embolectomy    Examination-Activity Limitations Bed Mobility;Locomotion Level;Stand;Transfers;Stairs    Examination-Participation Restrictions Shop;Community Activity    Rehab Potential Good    PT Frequency 1x / week    PT Duration 8 weeks    PT Treatment/Interventions ADLs/Self Care Home Management;Canalith Repostioning;Gait training;Stair training;Functional mobility training;Therapeutic activities;Therapeutic exercise;Balance training;Neuromuscular re-education;Patient/family education;Vestibular;Aquatic Therapy;DME Instruction    Consulted and Agree with Plan of Care Patient;Family member/caregiver    Family Member Consulted Husband           Patient will benefit from skilled therapeutic intervention in order to improve the following deficits and impairments:  Decreased balance, Difficulty walking, Dizziness  Visit Diagnosis: Repeated falls  Unsteadiness on feet  Difficulty in walking, not elsewhere classified  Dizziness and giddiness  BPPV (benign paroxysmal positional vertigo), left     Problem List Patient Active Problem List   Diagnosis Date Noted  . Acute gout 09/21/2019  . Vertigo of central origin 02/09/2019  . Other peripheral vertigo, unspecified ear 02/09/2019  . Nonintractable headache 02/09/2019  . Pruritus 01/02/2019  . Hypokalemia 08/09/2018  . Long term (current) use of anticoagulants 08/01/2018  . Pre-operative clearance 08/01/2018  . Closed compression fracture of L4 vertebra ([place])  07/11/2018  . Bilateral leg edema 07/11/2018  . Lung nodule < 6cm on CT 07/11/2018  . Hypothyroidism (acquired) 04/11/2018  . Fracture of femoral neck, right ([place]) 03/22/2018  . PAF (paroxysmal atrial fibrillation) ([place]) 03/18/2018  . 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 Herpes zoster without complication 92/04/9416  . Jaw claudication 07/27/2017  . Temporal arteritis ([place]) 07/18/2017  . Vision, loss, sudden, bilateral 07/18/2017  . 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 Osteoarthritis 06/25/2016  . Anxiety 06/25/2016  . Back pain 07/18/2015  . 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 Carotid bruit present 06/05/2010  . Osteoporosis 06/03/2008  . History of colonic polyps 06/03/2008  . INTENTION TREMOR 12/06/2007  . IBS 10/16/2007  . Hyperlipidemia 06/06/2007  . Essential hypertension 06/06/2007  . Chronic obstructive airway disease with asthma ([place]) 02/23/2007   PHYSICAL THERAPY DISCHARGE SUMMARY  Visits from Start of  Care: 32  Current functional level related to goals / functional outcomes: See impression statement and LTG achievement above.   Remaining deficits: Imbalance, impaired endurance   Education / Equipment: HEP  Plan: Patient agrees to discharge.  Patient goals were met. Patient is being discharged due to meeting the stated rehab goals.  ?????      Rico Junker, PT, DPT 12/13/19    5:51 PM    [facility] [address] [place], [place], [address] Phone: [phone]   Fax:  [phone]  Name: ALSIE YOUNES MRN: [id number] Date of Birth: [birth year]

## 2019-12-24 ENCOUNTER — Other Ambulatory Visit: Payer: Self-pay | Admitting: Internal Medicine

## 2019-12-24 ENCOUNTER — Ambulatory Visit: Payer: Medicare Other | Admitting: Primary Care

## 2019-12-25 ENCOUNTER — Ambulatory Visit: Payer: Medicare Other | Admitting: Primary Care

## 2019-12-25 DIAGNOSIS — S72001D Fracture of unspecified part of neck of right femur, subsequent encounter for closed fracture with routine healing: Secondary | ICD-10-CM | POA: Diagnosis not present

## 2019-12-25 DIAGNOSIS — J449 Chronic obstructive pulmonary disease, unspecified: Secondary | ICD-10-CM | POA: Diagnosis not present

## 2019-12-25 DIAGNOSIS — S72001A Fracture of unspecified part of neck of right femur, initial encounter for closed fracture: Secondary | ICD-10-CM | POA: Diagnosis not present

## 2020-01-06 ENCOUNTER — Encounter (HOSPITAL_COMMUNITY): Payer: Self-pay

## 2020-01-06 ENCOUNTER — Emergency Department (HOSPITAL_COMMUNITY)
Admission: EM | Admit: 2020-01-06 | Discharge: 2020-01-07 | Disposition: A | Discharge status: Home / Self Care | Payer: Medicare Other | Attending: Emergency Medicine | Admitting: Emergency Medicine

## 2020-01-06 DIAGNOSIS — R52 Pain, unspecified: Secondary | ICD-10-CM | POA: Diagnosis not present

## 2020-01-06 DIAGNOSIS — M25551 Pain in right hip: Secondary | ICD-10-CM | POA: Insufficient documentation

## 2020-01-06 DIAGNOSIS — M549 Dorsalgia, unspecified: Secondary | ICD-10-CM | POA: Insufficient documentation

## 2020-01-06 DIAGNOSIS — R0902 Hypoxemia: Secondary | ICD-10-CM | POA: Diagnosis not present

## 2020-01-06 DIAGNOSIS — Z5321 Procedure and treatment not carried out due to patient leaving prior to being seen by health care provider: Secondary | ICD-10-CM | POA: Insufficient documentation

## 2020-01-06 DIAGNOSIS — I10 Essential (primary) hypertension: Secondary | ICD-10-CM | POA: Diagnosis not present

## 2020-01-06 NOTE — ED Notes (Signed)
Pt stated she was leaving.  

## 2020-01-06 NOTE — ED Triage Notes (Signed)
Pt BIB [facility] for eval of R sided hip pain and back pain x 1 week. No injury/trauma.

## 2020-01-07 ENCOUNTER — Ambulatory Visit: Payer: Medicare Other | Admitting: Primary Care

## 2020-01-07 DIAGNOSIS — S72031D Displaced midcervical fracture of right femur, subsequent encounter for closed fracture with routine healing: Secondary | ICD-10-CM | POA: Diagnosis not present

## 2020-01-07 NOTE — Progress Notes (Deleted)
@Patient  ID: Crystal [name], female    DOB: 02-20-1946, 74 y.o.   MRN: [id number]  No chief complaint on file.   Referring provider: [name], MD  HPI:  History of Present Illness: 74 year old female, former smoker quit 1991 (23-pack-year history).  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 fib, COPD, hypothyroidism.  Patient of Dr. Shearon Stalls, seen for initial consult on 10/22/2019.  Ordered for full pulmonary function test to obtain baseline lung functioning.  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  No significant groundglass on CT scan.  Due for repeat CT chest in August 2022 to establish 2-year stability.  Continues on Symbicort and theophylline.  Previous [facility] pulmonary encounter: 10/22/19- Dr. Shearon Stalls  HPI: Crystal [name] is a 74 y.o. 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 Biopsy done of temporal artery in Feb 2019.  Here for shortness of breath. History of A. Fib. Has been on and off amiodarone over the past 2 years. Sees Dr. Stanford Breed. Amiodarone discontinued when she was more short of breath in the last few weeks in the setting of an abnormal CT Chest. Notes cough which is dry, fatigue and weakness. Arrived today on room air and had oxygen saturations in the 70s.   Was diagnosed with asthma in the 1970s.  She has a rescue inhaler, albuterol, uses 3-4 times/week.  Current Regimen: She takes theophylline, singulair and symbicort for asthma. Asthma Triggers: strong smells, perfumes, smokes, mold, pollen Exacerbations in the last year: none History of hospitalization or intubation: not for breathing GERD: denies Allergic Rhinitis: yes ACT:  Asthma Control Test ACT Total Score  10/22/2019 16   ILD REVIEW OF  SYSTEMS No exposure to asbestos, silica or other organic allergens    -pt worked in Education administrator for athletic teams.  No birds or chickens Pet cat No water damage or mold exposure in home No hot tub at home No arthralgias or myalgias Denies skin rash or lesions Denies nail changes or finger splitting +Raynauds in the past - doesn't happen currently.  Denies dry eyes or dry mouth Denies dysphagia or heartburn PPD negative and no h/o exposure +amiodarone exposure Originally from [place] - lived in [place] over 30 years.      01/07/2020-    Allergies  Allergen Reactions  . Simvastatin     myalgias    Immunization History  Administered Date(s) Administered  . Fluad Quad(high Dose 65+) 02/09/2019  . Influenza Split 03/14/2012  . Influenza Whole 04/15/2003, 03/05/2008, 06/22/2008, 04/13/2013  . Influenza, High Dose Seasonal PF 03/22/2014, 03/16/2018  . Influenza-Unspecified 03/14/2014, 04/23/2015, 04/23/2016, 03/14/2017  . PFIZER SARS-COV-2 Vaccination 07/21/2019, 08/15/2019  . Pneumococcal Conjugate-13 07/18/2015  . Pneumococcal Polysaccharide-23 06/15/1995, 06/05/2010  . Tdap 06/08/2012  . Zoster 06/13/2009  . Zoster Recombinat (Shingrix) 03/03/2017, 05/14/2017    Past Medical History:  Diagnosis Date  . Adenomatous colon polyp 2007 & 2010    Dr [name]  . Allergy    seasonal  . Arthritis   . Asthma   . Cataract   . Diverticulosis   . 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 Hyperlipidemia   . Hypertension   . IBS (irritable bowel syndrome)   . Legally blind   . MVP (mitral valve prolapse)   . Paroxysmal atrial fibrillation with  rapid ventricular response ([place]) 08/29/2017  . Peripheral vascular disease ([place])   . RAD (reactive airway disease)     Tobacco History: Social History   Tobacco Use  Smoking Status Former Smoker  . Packs/day: 1.00  . Years: 23.00  . Pack years: 23.00  . Quit date: 06/14/1989  . Years since  quitting: 30.5  Smokeless Tobacco Never Used  Tobacco Comment   smoked 1961-1991 , up to < 1 ppd   Counseling given: Not Answered Comment: smoked 1961-1991 , up to < 1 ppd   Outpatient Medications Prior to Visit  Medication Sig Dispense Refill  . albuterol (VENTOLIN HFA) 108 (90 Base) MCG/ACT inhaler INHALE ONE PUFF BY MOUTH TWICE A DAY AS NEEDED 8.5 g 1  . amoxicillin (AMOXIL) 500 MG tablet Take 4 tablets by mouth as needed. Pt take 4 tablets prior dental work    . Calcium Citrate-Vitamin D (CALCIUM + D PO) Take 1 tablet by mouth daily.    . Cholecalciferol (VITAMIN D3) 1000 units CAPS Take 1 capsule (1,000 Units total) by mouth daily with lunch. 60 capsule 0  . diclofenac Sodium (VOLTAREN) 1 % GEL Voltaren 1 % topical gel    . diltiazem (CARDIZEM CD) 180 MG 24 hr capsule Take 1 capsule (180 mg total) by mouth daily. 90 capsule 3  . ELIQUIS 5 MG TABS tablet TAKE 1 TABLET BY MOUTH TWO TIMES A DAY 180 tablet 1  . furosemide (LASIX) 40 MG tablet TAKE ONE TABLET BY MOUTH DAILY 90 tablet 0  . gabapentin (NEURONTIN) 300 MG capsule Take 2 capsules (600 mg total) by mouth at bedtime. 180 capsule 1  . KLOR-CON M20 20 MEQ tablet TAKE TWO TABLETS BY MOUTH DAILY 180 tablet 0  . levothyroxine (SYNTHROID) 50 MCG tablet Take 1 tablet (50 mcg total) by mouth daily. 90 tablet 3  . LORazepam (ATIVAN) 1 MG tablet TAKE ONE TABLET BY MOUTH EVERY NIGHT AT BEDTIME 30 tablet 4  . methocarbamol (ROBAXIN) 500 MG tablet TAKE ONE TABLET BY MOUTH EVERY 6 HOURS AS NEEDED FOR MUSCLE SPASMS 120 tablet 2  . montelukast (SINGULAIR) 10 MG tablet TAKE ONE TABLET BY MOUTH EVERY NIGHT AT BEDTIME 90 tablet 0  . predniSONE (STERAPRED UNI-PAK 21 TAB) 10 MG (21) TBPK tablet 6 tabs day 1, 5 tabs day 2, 4 tabs day 3, 3 tabs day 4, 2 tabs day 5, 1 tab day 6 21 tablet 0  . SYMBICORT 160-4.5 MCG/ACT inhaler INHALE ONE TO TWO PUFFS BY MOUTH EVERY 12 HOURS, THEN GARGLE AND SPIT AFTER USE 10.2 g 9  . theophylline (UNIPHYL) 400 MG 24 hr  tablet TAKE 1/2 TABLET BY MOUTH TWO TIMES A DAY 90 tablet 0  . traMADol (ULTRAM) 50 MG tablet Take 1 tablet (50 mg total) by mouth every 6 (six) hours as needed for moderate pain. 90 tablet 0   No facility-administered medications prior to visit.      Review of Systems  Review of Systems   Physical Exam  There were no vitals taken for this visit. Physical Exam   Lab Results:  CBC    Component Value Date/Time   WBC 12.2 (H) 07/17/2019 1124   RBC 3.51 (L) 07/17/2019 1124   HGB 12.5 07/17/2019 1124   HGB 14.5 05/15/2018 1432   HCT 39.2 07/17/2019 1124   HCT 42.9 05/15/2018 1432   PLT 338.0 07/17/2019 1124   PLT 315 05/15/2018 1432   MCV 111.7 (H) 07/17/2019 1124   MCV 104 (H)  05/15/2018 1432   MCH 36.0 (H) 02/06/2019 0500   MCHC 31.9 07/17/2019 1124   RDW 17.6 (H) 07/17/2019 1124   RDW 14.3 05/15/2018 1432   LYMPHSABS 0.7 07/17/2019 1124   MONOABS 1.4 (H) 07/17/2019 1124   EOSABS 0.8 (H) 07/17/2019 1124   BASOSABS 0.2 (H) 07/17/2019 1124    BMET    Component Value Date/Time   NA 136 07/17/2019 1124   NA 140 05/26/2018 0941   K 3.9 07/17/2019 1124   CL 99 07/17/2019 1124   CO2 25 07/17/2019 1124   GLUCOSE 89 07/17/2019 1124   GLUCOSE 83 06/01/2006 0950   BUN 7 07/17/2019 1124   BUN 28 (H) 05/26/2018 0941   CREATININE 0.73 07/17/2019 1124   CALCIUM 9.0 07/17/2019 1124   GFRNONAA >60 02/06/2019 0500   GFRAA >60 02/06/2019 0500    BNP    Component Value Date/Time   BNP 123.0 (H) 07/10/2018 1916    ProBNP No results found for: PROBNP  Imaging: No results found.   Assessment & Plan:   No problem-specific Assessment & Plan notes found for this encounter.     Martyn Ehrich, NP 01/07/2020

## 2020-01-10 ENCOUNTER — Other Ambulatory Visit: Payer: Self-pay | Admitting: Internal Medicine

## 2020-01-13 NOTE — Progress Notes (Signed)
Subjective:    Patient ID: Crystal [name], female    DOB: 04/13/1946, 74 y.o.   MRN: [id number]  HPI The patient is here for follow up of their chronic medical problems, including Afib, htn, b/l edema in legs, anxiety, hypothyroidism, asthma, chronic back pack from OA and compression fx   Went to urgent care 6/27 for foot/ankle pain on left - diagnosed with gout and prescribed prednisone.   Having a lot of back pain.  Mostly spasms.  Having an epidural on wed.  Will likely need to have surgery. She is taking the methocarbamol which helps.     Medications and allergies reviewed with patient and updated if appropriate.  Patient Active Problem List   Diagnosis Date Noted  . Acute gout 09/21/2019  . Vertigo of central origin 02/09/2019  . Other peripheral vertigo, unspecified ear 02/09/2019  . Pruritus 01/02/2019  . Hypokalemia 08/09/2018  . Long term (current) use of anticoagulants 08/01/2018  . Closed compression fracture of L4 vertebra ([place]) 07/11/2018  . Bilateral leg edema 07/11/2018  . Lung nodule < 6cm on CT 07/11/2018  . Hypothyroidism (acquired) 04/11/2018  . Fracture of femoral neck, right ([place]) 03/22/2018  . PAF (paroxysmal atrial fibrillation) ([place]) 03/18/2018  . 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 Herpes zoster without complication 78/67/6720  . Temporal arteritis ([place]) 07/18/2017  . Vision, loss, sudden, bilateral 07/18/2017  . Osteoarthritis 06/25/2016  . Anxiety 06/25/2016  . Back pain 07/18/2015  . 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 Carotid bruit present 06/05/2010  . Osteoporosis 06/03/2008  . History of colonic polyps 06/03/2008  . INTENTION TREMOR 12/06/2007  . IBS 10/16/2007  . Hyperlipidemia 06/06/2007  . Essential hypertension 06/06/2007  . Chronic obstructive airway disease with asthma ([place]) 02/23/2007    Current Outpatient Medications on File Prior to Visit  Medication Sig Dispense Refill  . albuterol (VENTOLIN HFA) 108 (90 Base)  MCG/ACT inhaler INHALE ONE PUFF BY MOUTH TWICE A DAY AS NEEDED 8.5 g 1  . alendronate (FOSAMAX) 70 MG tablet TAKE 1 TABLET BY MOUTH ONCE WEEKLY ON AN EMPTY STOMACH BEFORE BREAKFAST. REMAIN UPRIGHT FOR 30 MINUTES & TAKE WITH 8 OUNCES OF WATER    . amoxicillin (AMOXIL) 500 MG tablet Take 4 tablets by mouth as needed. Pt take 4 tablets prior dental work    . Calcium Citrate-Vitamin D (CALCIUM + D PO) Take 1 tablet by mouth daily.    . Cholecalciferol (VITAMIN D3) 1000 units CAPS Take 1 capsule (1,000 Units total) by mouth daily with lunch. 60 capsule 0  . diclofenac Sodium (VOLTAREN) 1 % GEL Voltaren 1 % topical gel    . diltiazem (CARDIZEM CD) 180 MG 24 hr capsule Take 1 capsule (180 mg total) by mouth daily. 90 capsule 3  . diltiazem (TIAZAC) 180 MG 24 hr capsule Take 180 mg by mouth daily.    Marland Kitchen ELIQUIS 5 MG TABS tablet TAKE 1 TABLET BY MOUTH TWO TIMES A DAY 180 tablet 1  . furosemide (LASIX) 40 MG tablet TAKE ONE TABLET BY MOUTH DAILY 90 tablet 0  . gabapentin (NEURONTIN) 300 MG capsule Take 2 capsules (600 mg total) by mouth at bedtime. 180 capsule 1  . HYDROcodone-acetaminophen (NORCO/VICODIN) 5-325 MG tablet hydrocodone 5 mg-acetaminophen 325 mg tablet  Take 1 tablet every 6 hours by oral route as needed.    Marland Kitchen KLOR-CON M20 20 MEQ tablet TAKE TWO TABLETS BY MOUTH DAILY 180 tablet 0  . levothyroxine (SYNTHROID) 50 MCG tablet Take 1  tablet (50 mcg total) by mouth daily. 90 tablet 3  . LORazepam (ATIVAN) 1 MG tablet TAKE ONE TABLET BY MOUTH EVERY NIGHT AT BEDTIME 30 tablet 3  . methocarbamol (ROBAXIN) 500 MG tablet TAKE ONE TABLET BY MOUTH EVERY 6 HOURS AS NEEDED FOR MUSCLE SPASMS 120 tablet 2  . montelukast (SINGULAIR) 10 MG tablet TAKE ONE TABLET BY MOUTH EVERY NIGHT AT BEDTIME 90 tablet 0  . SYMBICORT 160-4.5 MCG/ACT inhaler INHALE ONE TO TWO PUFFS BY MOUTH EVERY 12 HOURS, THEN GARGLE AND SPIT AFTER USE 10.2 g 9  . theophylline (UNIPHYL) 400 MG 24 hr tablet TAKE 1/2 TABLET BY MOUTH TWO TIMES A  DAY 90 tablet 0  . traMADol (ULTRAM) 50 MG tablet Take 1 tablet (50 mg total) by mouth every 6 (six) hours as needed for moderate pain. 90 tablet 0   No current facility-administered medications on file prior to visit.    Past Medical History:  Diagnosis Date  . Adenomatous colon polyp 2007 & 2010    Dr [name]  . Allergy    seasonal  . Arthritis   . Asthma   . Cataract   . Diverticulosis   . 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 Hyperlipidemia   . Hypertension   . IBS (irritable bowel syndrome)   . Legally blind   . MVP (mitral valve prolapse)   . Paroxysmal atrial fibrillation with rapid ventricular response ([place]) 08/29/2017  . Peripheral vascular disease ([place])   . RAD (reactive airway disease)     Past Surgical History:  Procedure Laterality Date  . ABDOMINAL HYSTERECTOMY     with USO for dysfunctionall menses  . APPENDECTOMY    . ARTERY BIOPSY Bilateral 07/21/2017   Procedure: BIOPSY BILATERAL TEMPORAL ARTERY;  Surgeon: [name], MD;  Location: [facility];  Service: Vascular;  Laterality: Bilateral;  . CARDIOVERSION N/A 06/16/2018   Procedure: CARDIOVERSION;  Surgeon: [name], MD;  Location: [facility];  Service: Cardiovascular;  Laterality: N/A;  . CATARACT EXTRACTION W/ INTRAOCULAR LENS IMPLANT  2013   bilateral; Dr [name]  . COLONOSCOPY  2013   negative , due 2018; Dr [name]  . COLONOSCOPY W/ POLYPECTOMY      X2 ; diverticulosis  . EMBOLECTOMY Right 08/29/2017   Procedure: Right Groin Exploration  ;  Surgeon: Rosetta Posner, MD;  Location: [facility];  Service: Vascular;  Laterality: Right;  . HIP PINNING,CANNULATED Right 03/21/2018  . HIP PINNING,CANNULATED Right 03/21/2018   Procedure: CANNULATED HIP PINNING;  Surgeon: Rod Can, MD;  Location: [facility];  Service: Orthopedics;  Laterality: Right;  . LUMBAR LAMINECTOMY  2000   Dr Vertell Limber  . SHOULDER SURGERY     R shoulder  . TONSILLECTOMY      Social History   Socioeconomic History    . Marital status: Married    Spouse name: Not on file  . Number of children: Not on file  . Years of education: Not on file  . Highest education level: Not on file  Occupational History  . Occupation: retired  Tobacco Use  . Smoking status: Former Smoker    Packs/day: 1.00    Years: 23.00    Pack years: 23.00    Quit date: 06/14/1989    Years since quitting: 30.6  . Smokeless tobacco: Never Used  . Tobacco comment: smoked 1961-1991 , up to < 1 ppd  Vaping Use  . Vaping Use: Never used  Substance and Sexual Activity  . Alcohol use:  Yes    Alcohol/week: 14.0 standard drinks    Types: 14 Glasses of wine per week    Comment: Wine  . Drug use: No  . Sexual activity: Not on file  Other Topics Concern  . Not on file  Social History Narrative  . Not on file   Social Determinants of Health   Financial Resource Strain:   . Difficulty of Paying Living Expenses:   Food Insecurity:   . Worried About Charity fundraiser in the Last Year:   . Arboriculturist in the Last Year:   Transportation Needs:   . Film/video editor (Medical):   Marland Kitchen Lack of Transportation (Non-Medical):   Physical Activity:   . Days of Exercise per Week:   . Minutes of Exercise per Session:   Stress:   . Feeling of Stress :   Social Connections:   . Frequency of Communication with Friends and Family:   . Frequency of Social Gatherings with Friends and Family:   . Attends Religious Services:   . Active Member of Clubs or Organizations:   . Attends Archivist Meetings:   Marland Kitchen Marital Status:     Family History  Problem Relation Age of Onset  . Heart attack Brother 41  . Parkinsonism Mother   . Stroke Mother   . Heart attack Father        pre 59  . Diabetes Sister   . COPD Sister        X 3  . Other Son        suicide 2016  . Colon cancer Neg Hx   . Cancer Neg Hx   . Esophageal cancer Neg Hx   . Liver cancer Neg Hx   . Pancreatic cancer Neg Hx   . Rectal cancer Neg Hx   . Stomach  cancer Neg Hx     Review of Systems  Constitutional: Negative for chills and fever.  Respiratory: Positive for cough (occ). Negative for shortness of breath and wheezing.   Cardiovascular: Positive for leg swelling. Negative for chest pain and palpitations.  Neurological: Negative for dizziness, light-headedness and headaches.       Objective:   Vitals:   01/14/20 1035  BP: (!) 116/62  Pulse: 80  Temp: 98.1 F (36.7 C)  SpO2: 94%   BP Readings from Last 3 Encounters:  01/14/20 (!) 116/62  10/29/19 (!) 142/66  10/22/19 130/60   Wt Readings from Last 3 Encounters:  01/06/20 136 lb 11 oz (62 kg)  10/22/19 135 lb 3.2 oz (61.3 kg)  09/21/19 136 lb (61.7 kg)   Body mass index is 23.46 kg/m.   Physical Exam    Constitutional: Appears well-developed and well-nourished. No distress.  HENT:  Head: Normocephalic and atraumatic.  Neck: Neck supple. No tracheal deviation present. No thyromegaly present.  No cervical lymphadenopathy Cardiovascular: Normal rate, regular rhythm and normal heart sounds.   No murmur heard. No carotid bruit .  1 + b/l pitting edema Pulmonary/Chest: Effort normal and breath sounds normal. No respiratory distress. No has no wheezes. No rales.  Skin: Skin is warm and dry. Not diaphoretic.  Psychiatric: Normal mood and affect. Behavior is normal.      Assessment & Plan:    See Problem List for Assessment and Plan of chronic medical problems.    This visit occurred during the SARS-CoV-2 public health emergency.  Safety protocols were in place, including screening questions prior to the visit, additional usage  of staff PPE, and extensive cleaning of exam room while observing appropriate contact time as indicated for disinfecting solutions.

## 2020-01-14 ENCOUNTER — Encounter: Payer: Self-pay | Admitting: Internal Medicine

## 2020-01-14 ENCOUNTER — Other Ambulatory Visit: Payer: Self-pay

## 2020-01-14 ENCOUNTER — Ambulatory Visit: Payer: Medicare Other | Admitting: Internal Medicine

## 2020-01-14 VITALS — BP 116/62 | HR 80 | Temp 98.1°F | Ht 64.0 in

## 2020-01-14 DIAGNOSIS — E039 Hypothyroidism, unspecified: Secondary | ICD-10-CM | POA: Diagnosis not present

## 2020-01-14 DIAGNOSIS — F419 Anxiety disorder, unspecified: Secondary | ICD-10-CM | POA: Diagnosis not present

## 2020-01-14 DIAGNOSIS — I480 Paroxysmal atrial fibrillation: Secondary | ICD-10-CM | POA: Diagnosis not present

## 2020-01-14 DIAGNOSIS — J449 Chronic obstructive pulmonary disease, unspecified: Secondary | ICD-10-CM

## 2020-01-14 DIAGNOSIS — I10 Essential (primary) hypertension: Secondary | ICD-10-CM

## 2020-01-14 DIAGNOSIS — R600 Localized edema: Secondary | ICD-10-CM | POA: Diagnosis not present

## 2020-01-14 LAB — COMPLETE METABOLIC PANEL WITH GFR
AG Ratio: 1.5 (calc) (ref 1.0–2.5)
ALT: 7 U/L (ref 6–29)
AST: 22 U/L (ref 10–35)
Albumin: 4.1 g/dL (ref 3.6–5.1)
Alkaline phosphatase (APISO): 124 U/L (ref 37–153)
BUN: 9 mg/dL (ref 7–25)
CO2: 28 mmol/L (ref 20–32)
Calcium: 9.6 mg/dL (ref 8.6–10.4)
Chloride: 97 mmol/L — ABNORMAL LOW (ref 98–110)
Creat: 0.77 mg/dL (ref 0.60–0.93)
GFR, Est African American: 88 mL/min/{1.73_m2} (ref >=60)
GFR, Est Non African American: 76 mL/min/{1.73_m2} (ref >=60)
Globulin: 2.7 g/dL (calc) (ref 1.9–3.7)
Glucose, Bld: 92 mg/dL (ref 65–99)
Potassium: 3.2 mmol/L — ABNORMAL LOW (ref 3.5–5.3)
Sodium: 138 mmol/L (ref 135–146)
Total Bilirubin: 0.8 mg/dL (ref 0.2–1.2)
Total Protein: 6.8 g/dL (ref 6.1–8.1)

## 2020-01-14 LAB — LIPID PANEL
Cholesterol: 293 mg/dL — ABNORMAL HIGH (ref <200)
HDL: 151 mg/dL (ref >=50)
LDL Cholesterol (Calc): 118 mg/dL (calc) — ABNORMAL HIGH
Non-HDL Cholesterol (Calc): 142 mg/dL (calc) — ABNORMAL HIGH (ref <130)
Total CHOL/HDL Ratio: 1.9 (calc) (ref <5.0)
Triglycerides: 129 mg/dL (ref <150)

## 2020-01-14 LAB — CBC WITH DIFFERENTIAL/PLATELET
Absolute Monocytes: 1867 cells/uL — ABNORMAL HIGH (ref 200–950)
Basophils Absolute: 214 cells/uL — ABNORMAL HIGH (ref 0–200)
Basophils Relative: 1.4 %
Eosinophils Absolute: 214 cells/uL (ref 15–500)
Eosinophils Relative: 1.4 %
HCT: 45 % (ref 35.0–45.0)
Hemoglobin: 14.4 g/dL (ref 11.7–15.5)
Lymphs Abs: 903 cells/uL (ref 850–3900)
MCH: 31.1 pg (ref 27.0–33.0)
MCHC: 32 g/dL (ref 32.0–36.0)
MCV: 97.2 fL (ref 80.0–100.0)
MPV: 9 fL (ref 7.5–12.5)
Monocytes Relative: 12.2 %
Neutro Abs: 12102 cells/uL — ABNORMAL HIGH (ref 1500–7800)
Neutrophils Relative %: 79.1 %
Platelets: 423 10*3/uL — ABNORMAL HIGH (ref 140–400)
RBC: 4.63 10*6/uL (ref 3.80–5.10)
RDW: 17.2 % — ABNORMAL HIGH (ref 11.0–15.0)
Total Lymphocyte: 5.9 %
WBC: 15.3 10*3/uL — ABNORMAL HIGH (ref 3.8–10.8)

## 2020-01-14 LAB — TSH: TSH: 5.17 mIU/L — ABNORMAL HIGH (ref 0.40–4.50)

## 2020-01-14 MED ORDER — THEOPHYLLINE ER 400 MG PO TB24: ORAL_TABLET | ORAL | 1 refills | Status: AC

## 2020-01-14 NOTE — Assessment & Plan Note (Signed)
Chronic Paroxysmal Following with cardiology On eliquis, diltiazem Cbc, cmp

## 2020-01-14 NOTE — Assessment & Plan Note (Signed)
Chronic Stable Wearing compression socks Taking lasix 40 mg daily - but does not take the days she has appts which may be a few days in one week Continue lasix - try to take if she has an early appt when she gets home cmp

## 2020-01-14 NOTE — Assessment & Plan Note (Signed)
Chronic  Clinically euthyroid Check tsh  Titrate med dose if needed  

## 2020-01-14 NOTE — Assessment & Plan Note (Signed)
Chronic BP well controlled Current regimen effective and well tolerated Continue current medications at current doses cmp  

## 2020-01-14 NOTE — Assessment & Plan Note (Signed)
Chronic Stable, controlled Continue current regimen Sees pulmonary - she will take over regimen at her next visit

## 2020-01-14 NOTE — Patient Instructions (Signed)
  Blood work was ordered.     Medications reviewed and updated.  Changes include :   none  Your prescription(s) have been submitted to your pharmacy. Please take as directed and contact our office if you believe you are having problem(s) with the medication(s).   Please followup in 6 months   

## 2020-01-14 NOTE — Assessment & Plan Note (Signed)
Chronic Controlled, stable Continue current dose of medication Ativan at night

## 2020-01-16 ENCOUNTER — Other Ambulatory Visit: Payer: Self-pay | Admitting: Internal Medicine

## 2020-01-16 DIAGNOSIS — M545 Low back pain: Secondary | ICD-10-CM | POA: Diagnosis not present

## 2020-01-16 DIAGNOSIS — M7061 Trochanteric bursitis, right hip: Secondary | ICD-10-CM | POA: Diagnosis not present

## 2020-01-16 MED ORDER — POTASSIUM CHLORIDE CRYS ER 20 MEQ PO TBCR: 60.0000 meq | EXTENDED_RELEASE_TABLET | Freq: Every day | ORAL | 1 refills | Status: DC

## 2020-01-16 MED ORDER — LEVOTHYROXINE SODIUM 75 MCG PO TABS: 75.0000 ug | ORAL_TABLET | Freq: Every day | ORAL | 3 refills | Status: AC

## 2020-01-17 ENCOUNTER — Ambulatory Visit (INDEPENDENT_AMBULATORY_CARE_PROVIDER_SITE_OTHER): Payer: Medicare Other

## 2020-01-17 ENCOUNTER — Other Ambulatory Visit: Payer: Self-pay

## 2020-01-17 DIAGNOSIS — Z0000 Encounter for general adult medical examination without abnormal findings: Secondary | ICD-10-CM

## 2020-01-17 NOTE — Progress Notes (Signed)
I connected with Crystal [name] today by telephone and verified that I am speaking with the correct person using two identifiers. Location patient: home Location provider: work Persons participating in the virtual visit: Doris Gruhn and Lisette Abu, LPN   I discussed the limitations, risks, security and privacy concerns of performing an evaluation and management service by telephone and the availability of in person appointments. I also discussed with the patient that there may be a patient responsible charge related to this service. The patient expressed understanding and verbally consented to this telephonic visit.    Interactive audio and video telecommunications were attempted between this provider and patient, however failed, due to patient having technical difficulties OR patient did not have access to video capability.  We continued and completed visit with audio only.  Some vital signs may be absent or patient reported.   Time Spent with patient on telephone encounter: 20 minutes  Subjective:   Crystal [name] is a 74 y.o. female who presents for Medicare Annual (Subsequent) preventive examination.  Review of Systems    No ROS. Medicare Wellness Virtual Visit. Additional risk factors are reflected in social history. Cardiac Risk Factors include: advanced age (>53men, >62 women);dyslipidemia;family history of premature cardiovascular disease;hypertension     Objective:    Today's Vitals   01/17/20 1112  PainSc: 0-No pain   There is no height or weight on file to calculate BMI.  Advanced Directives 01/17/2020 03/12/2019 02/01/2019 08/04/2018 08/02/2018 07/10/2018 06/16/2018  Does Patient Have a Medical Advance Directive? Yes Yes No Yes Yes No No  Type of Advance Directive - [place];Living will - [place];Living will [place];Living will - -  Does patient want to make changes to medical advance directive? No - Patient declined  - - No - Patient declined No - Patient declined - -  Copy of [place] in Chart? - - - No - copy requested Yes - validated most recent copy scanned in chart (See row information) - -  Would patient like information on creating a medical advance directive? - - No - Patient declined No - Patient declined - No - Patient declined No - Patient declined    Current Medications (verified) Outpatient Encounter Medications as of 01/17/2020  Medication Sig  . albuterol (VENTOLIN HFA) 108 (90 Base) MCG/ACT inhaler INHALE ONE PUFF BY MOUTH TWICE A DAY AS NEEDED  . alendronate (FOSAMAX) 70 MG tablet TAKE 1 TABLET BY MOUTH ONCE WEEKLY ON AN EMPTY STOMACH BEFORE BREAKFAST. REMAIN UPRIGHT FOR 30 MINUTES & TAKE WITH 8 OUNCES OF WATER  . amoxicillin (AMOXIL) 500 MG tablet Take 4 tablets by mouth as needed. Pt take 4 tablets prior dental work  . Calcium Citrate-Vitamin D (CALCIUM + D PO) Take 1 tablet by mouth daily.  . Cholecalciferol (VITAMIN D3) 1000 units CAPS Take 1 capsule (1,000 Units total) by mouth daily with lunch.  . diclofenac Sodium (VOLTAREN) 1 % GEL Voltaren 1 % topical gel  . diltiazem (CARDIZEM CD) 180 MG 24 hr capsule Take 1 capsule (180 mg total) by mouth daily.  Marland Kitchen diltiazem (TIAZAC) 180 MG 24 hr capsule Take 180 mg by mouth daily.  Marland Kitchen ELIQUIS 5 MG TABS tablet TAKE 1 TABLET BY MOUTH TWO TIMES A DAY  . furosemide (LASIX) 40 MG tablet TAKE ONE TABLET BY MOUTH DAILY  . gabapentin (NEURONTIN) 300 MG capsule Take 2 capsules (600 mg total) by mouth at bedtime.  Marland Kitchen HYDROcodone-acetaminophen (NORCO/VICODIN)  5-325 MG tablet hydrocodone 5 mg-acetaminophen 325 mg tablet  Take 1 tablet every 6 hours by oral route as needed.  Marland Kitchen levothyroxine (SYNTHROID) 75 MCG tablet Take 1 tablet (75 mcg total) by mouth daily.  Marland Kitchen LORazepam (ATIVAN) 1 MG tablet TAKE ONE TABLET BY MOUTH EVERY NIGHT AT BEDTIME  . methocarbamol (ROBAXIN) 500 MG tablet TAKE ONE TABLET BY MOUTH EVERY 6 HOURS AS NEEDED FOR MUSCLE  SPASMS  . montelukast (SINGULAIR) 10 MG tablet TAKE ONE TABLET BY MOUTH EVERY NIGHT AT BEDTIME  . potassium chloride SA (KLOR-CON M20) 20 MEQ tablet Take 3 tablets (60 mEq total) by mouth daily.  . SYMBICORT 160-4.5 MCG/ACT inhaler INHALE ONE TO TWO PUFFS BY MOUTH EVERY 12 HOURS, THEN GARGLE AND SPIT AFTER USE  . theophylline (UNIPHYL) 400 MG 24 hr tablet TAKE 1/2 TABLET BY MOUTH TWO TIMES A DAY  . traMADol (ULTRAM) 50 MG tablet Take 1 tablet (50 mg total) by mouth every 6 (six) hours as needed for moderate pain.   No facility-administered encounter medications on file as of 01/17/2020.    Allergies (verified) Simvastatin   History: Past Medical History:  Diagnosis Date  . Adenomatous colon polyp 2007 & 2010    Dr [name]  . Allergy    seasonal  . Arthritis   . Asthma   . Cataract   . Diverticulosis   . 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 Hyperlipidemia   . Hypertension   . IBS (irritable bowel syndrome)   . Legally blind   . MVP (mitral valve prolapse)   . Paroxysmal atrial fibrillation with rapid ventricular response ([place]) 08/29/2017  . Peripheral vascular disease ([place])   . RAD (reactive airway disease)    Past Surgical History:  Procedure Laterality Date  . ABDOMINAL HYSTERECTOMY     with USO for dysfunctionall menses  . APPENDECTOMY    . ARTERY BIOPSY Bilateral 07/21/2017   Procedure: BIOPSY BILATERAL TEMPORAL ARTERY;  Surgeon: [name], MD;  Location: [facility];  Service: Vascular;  Laterality: Bilateral;  . CARDIOVERSION N/A 06/16/2018   Procedure: CARDIOVERSION;  Surgeon: [name], MD;  Location: [facility];  Service: Cardiovascular;  Laterality: N/A;  . CATARACT EXTRACTION W/ INTRAOCULAR LENS IMPLANT  2013   bilateral; Dr [name]  . COLONOSCOPY  2013   negative , due 2018; Dr [name]  . COLONOSCOPY W/ POLYPECTOMY      X2 ; diverticulosis  . EMBOLECTOMY Right 08/29/2017   Procedure: Right Groin Exploration  ;  Surgeon: Rosetta Posner,  MD;  Location: [facility];  Service: Vascular;  Laterality: Right;  . HIP PINNING,CANNULATED Right 03/21/2018  . HIP PINNING,CANNULATED Right 03/21/2018   Procedure: CANNULATED HIP PINNING;  Surgeon: Rod Can, MD;  Location: [facility];  Service: Orthopedics;  Laterality: Right;  . LUMBAR LAMINECTOMY  2000   Dr Vertell Limber  . SHOULDER SURGERY     R shoulder  . TONSILLECTOMY     Family History  Problem Relation Age of Onset  . Heart attack Brother 59  . Parkinsonism Mother   . Stroke Mother   . Heart attack Father        pre 51  . Diabetes Sister   . COPD Sister        X 3  . Other Son        suicide 2016  . Colon cancer Neg Hx   . Cancer Neg Hx   . Esophageal cancer Neg Hx   . Liver cancer  Neg Hx   . Pancreatic cancer Neg Hx   . Rectal cancer Neg Hx   . Stomach cancer Neg Hx    Social History   Socioeconomic History  . Marital status: Married    Spouse name: Not on file  . Number of children: Not on file  . Years of education: Not on file  . Highest education level: Not on file  Occupational History  . Occupation: retired  Tobacco Use  . Smoking status: Former Smoker    Packs/day: 1.00    Years: 23.00    Pack years: 23.00    Quit date: 06/14/1989    Years since quitting: 30.6  . Smokeless tobacco: Never Used  . Tobacco comment: smoked 1961-1991 , up to < 1 ppd  Vaping Use  . Vaping Use: Never used  Substance and Sexual Activity  . Alcohol use: Yes    Alcohol/week: 14.0 standard drinks    Types: 14 Glasses of wine per week    Comment: Wine  . Drug use: No  . Sexual activity: Not on file  Other Topics Concern  . Not on file  Social History Narrative  . Not on file   Social Determinants of Health   Financial Resource Strain:   . Difficulty of Paying Living Expenses:   Food Insecurity:   . Worried About Charity fundraiser in the Last Year:   . Arboriculturist in the Last Year:   Transportation Needs:   . Film/video editor (Medical):   Marland Kitchen Lack of  Transportation (Non-Medical):   Physical Activity:   . Days of Exercise per Week:   . Minutes of Exercise per Session:   Stress:   . Feeling of Stress :   Social Connections:   . Frequency of Communication with Friends and Family:   . Frequency of Social Gatherings with Friends and Family:   . Attends Religious Services:   . Active Member of Clubs or Organizations:   . Attends Archivist Meetings:   Marland Kitchen Marital Status:     Tobacco Counseling Counseling given: Not Answered Comment: smoked 1961-1991 , up to < 1 ppd   Clinical Intake:  Pre-visit preparation completed: Yes  Pain : No/denies pain Pain Score: 0-No pain     Nutritional Risks: None Diabetes: No  How often do you need to have someone help you when you read instructions, pamphlets, or other written materials from your doctor or pharmacy?: 1 - Never What is the last grade level you completed in school?: 2 year college  Diabetic? no  Interpreter Needed?: No  Information entered by :: Kalea Perine N. Brayah Urquilla, LPN   Activities of Daily Living In your present state of health, do you have any difficulty performing the following activities: 01/17/2020  Hearing? N  Vision? N  Difficulty concentrating or making decisions? N  Walking or climbing stairs? Y  Dressing or bathing? N  Doing errands, shopping? Y  Preparing Food and eating ? N  Using the Toilet? N  In the past six months, have you accidently leaked urine? N  Do you have problems with loss of bowel control? N  Managing your Medications? N  Managing your Finances? N  Housekeeping or managing your Housekeeping? N  Some recent data might be hidden    Patient Care Team: [name], MD as PCP - General (Internal Medicine) Stanford Breed Denice Bors, MD as PCP - Cardiology (Cardiology) Charlton Haws, [facility] as Pharmacist (Pharmacist)  Indicate any  recent Medical Services you may have received from other than [facility] providers in the past year (date may  be approximate).     Assessment:   This is a routine wellness examination for [place].  Hearing/Vision screen No exam data present  Dietary issues and exercise activities discussed: Current Exercise Habits: The patient does not participate in regular exercise at present, Exercise limited by: Other - see comments (legally blind; walks with neighbors and son with her walker)  Goals    .  Patient Stated (pt-stated)      I really miss walking and being active.      Depression Screen PHQ 2/9 Scores 01/17/2020 08/30/2017 06/25/2016 06/19/2014 06/13/2013 06/08/2012  PHQ - 2 Score 0 0 0 0 0 0    Fall Risk Fall Risk  01/17/2020 07/17/2019 08/30/2017 06/25/2016 06/19/2014  Falls in the past year? 0 1 No No No  Number falls in past yr: 0 1 - - -  Injury with Fall? 0 - - - -  Risk for fall due to : Impaired balance/gait - Impaired vision - -  Follow up Falls evaluation completed - - - -    Any stairs in or around the home? Yes  If so, are there any without handrails? No  Home free of loose throw rugs in walkways, pet beds, electrical cords, etc? Yes  Adequate lighting in your home to reduce risk of falls? Yes   ASSISTIVE DEVICES UTILIZED TO PREVENT FALLS:  Life alert? No  Use of a cane, walker or w/c? Yes  Grab bars in the bathroom? Yes  Shower chair or bench in shower? Yes  Elevated toilet seat or a handicapped toilet? Yes   TIMED UP AND GO:  Was the test performed? No .  Length of time to ambulate 10 feet: 0 sec.   Gait slow and steady with assistive device  Cognitive Function:        Immunizations Immunization History  Administered Date(s) Administered  . Fluad Quad(high Dose 65+) 02/09/2019  . Influenza Split 03/14/2012  . Influenza Whole 04/15/2003, 03/05/2008, 06/22/2008, 04/13/2013  . Influenza, High Dose Seasonal PF 03/22/2014, 03/16/2018  . Influenza-Unspecified 03/14/2014, 04/23/2015, 04/23/2016, 03/14/2017  . PFIZER SARS-COV-2 Vaccination 07/21/2019, 08/15/2019  .  Pneumococcal Conjugate-13 07/18/2015  . Pneumococcal Polysaccharide-23 06/15/1995, 06/05/2010  . Tdap 06/08/2012  . Zoster 06/13/2009  . Zoster Recombinat (Shingrix) 03/03/2017, 05/14/2017    TDAP status: Up to date Flu Vaccine status: Up to date Pneumococcal vaccine status: Up to date Covid-19 vaccine status: Completed vaccines  Qualifies for Shingles Vaccine? Yes   Zostavax completed Yes   Shingrix Completed?: Yes  Screening Tests Health Maintenance  Topic Date Due  . DEXA SCAN  07/29/2017  . INFLUENZA VACCINE  01/13/2020  . COLONOSCOPY  01/05/2022  . TETANUS/TDAP  06/08/2022  . COVID-19 Vaccine  Completed  . Hepatitis C Screening  Completed  . PNA vac Low Risk Adult  Completed    Health Maintenance  Health Maintenance Due  Topic Date Due  . DEXA SCAN  07/29/2017  . INFLUENZA VACCINE  01/13/2020    Colorectal cancer screening: No longer required.  Mammogram status: Completed 01/18/2018. Repeat every year Bone Density status: Completed 07/30/2015. Results reflect: Bone density results: OSTEOPOROSIS. Repeat every 2-3 years.  Lung Cancer Screening: (Low Dose CT Chest recommended if Age 72-80 years, 30 pack-year currently smoking OR have quit w/in 15years.) does not qualify.   Lung Cancer Screening Referral: no  Additional Screening:  Hepatitis C Screening: does qualify;  Completed yes  Vision Screening: Recommended annual ophthalmology exams for early detection of glaucoma and other disorders of the eye. Is the patient up to date with their annual eye exam?  No  Who is the provider or what is the name of the office in which the patient attends annual eye exams? N/a (patient is legally blind) If pt is not established with a provider, would they like to be referred to a provider to establish care? No .   Dental Screening: Recommended annual dental exams for proper oral hygiene  Community Resource Referral / Chronic Care Management: CRR required this visit?  No   CCM  required this visit?  No      Plan:     I have personally reviewed and noted the following in the patient's chart:   . Medical and social history . Use of alcohol, tobacco or illicit drugs  . Current medications and supplements . Functional ability and status . Nutritional status . Physical activity . Advanced directives . List of other physicians . Hospitalizations, surgeries, and ER visits in previous 12 months . Vitals . Screenings to include cognitive, depression, and falls . Referrals and appointments  In addition, I have reviewed and discussed with patient certain preventive protocols, quality metrics, and best practice recommendations. A written personalized care plan for preventive services as well as general preventive health recommendations were provided to patient.     Sheral Flow, LPN   0/12/2255   Nurse Notes:  Patient is cogitatively intact. There were no vitals filed for this visit. There is no height or weight on file to calculate BMI. Patient stated that she has issues with gait and balance; does use any assistive devices.

## 2020-01-17 NOTE — Patient Instructions (Addendum)
[name] , Thank you for taking time to come for your Medicare Wellness Visit. I appreciate your ongoing commitment to your health goals. Please review the following plan we discussed and let me know if I can assist you in the future.   Screening recommendations/referrals: Colonoscopy: no repeat due to age 74: scheduled for 2021 Bone Density: scheduled for 2021 Recommended yearly ophthalmology/optometry visit for glaucoma screening and checkup Recommended yearly dental visit for hygiene and checkup  Vaccinations: Influenza vaccine: 02/09/2019 Pneumococcal vaccine: completed Tdap vaccine: 06/08/2012 Shingles vaccine: completed   Covid-19: completed  Advanced directives: Documents on file  Conditions/risks identified: Yes; Please continue to do your personal lifestyle choices by: daily care of teeth and gums, regular physical activity (goal should be 5 days a week for 30 minutes), eat a healthy diet, avoid tobacco and drug use, limiting any alcohol intake, taking a low-dose aspirin (if not allergic or have been advised by your provider otherwise) and taking vitamins and minerals as recommended by your provider. Continue doing brain stimulating activities (puzzles, reading, adult coloring books, staying active) to keep memory sharp. Continue to eat heart healthy diet (full of fruits, vegetables, whole grains, lean protein, water--limit salt, fat, and sugar intake) and increase physical activity as tolerated.   Next appointment: Please schedule your next Medicare Wellness Visit with your Nurse Health Advisor in 1 year.   Preventive Care 58 Years and Older, Female Preventive care refers to lifestyle choices and visits with your health care provider that can promote health and wellness. What does preventive care include?  A yearly physical exam. This is also called an annual well check.  Dental exams once or twice a year.  Routine eye exams. Ask your health care provider how often  you should have your eyes checked.  Personal lifestyle choices, including:  Daily care of your teeth and gums.  Regular physical activity.  Eating a healthy diet.  Avoiding tobacco and drug use.  Limiting alcohol use.  Practicing safe sex.  Taking low-dose aspirin every day.  Taking vitamin and mineral supplements as recommended by your health care provider. What happens during an annual well check? The services and screenings done by your health care provider during your annual well check will depend on your age, overall health, lifestyle risk factors, and family history of disease. Counseling  Your health care provider may ask you questions about your:  Alcohol use.  Tobacco use.  Drug use.  Emotional well-being.  Home and relationship well-being.  Sexual activity.  Eating habits.  History of falls.  Memory and ability to understand (cognition).  Work and work Statistician.  Reproductive health. Screening  You may have the following tests or measurements:  Height, weight, and BMI.  Blood pressure.  Lipid and cholesterol levels. These may be checked every 5 years, or more frequently if you are over 57 years old.  Skin check.  Lung cancer screening. You may have this screening every year starting at age 67 if you have a 30-pack-year history of smoking and currently smoke or have quit within the past 15 years.  Fecal occult blood test (FOBT) of the stool. You may have this test every year starting at age 36.  Flexible sigmoidoscopy or colonoscopy. You may have a sigmoidoscopy every 5 years or a colonoscopy every 10 years starting at age 63.  Hepatitis C blood test.  Hepatitis B blood test.  Sexually transmitted disease (STD) testing.  Diabetes screening. This is done by checking your blood sugar (glucose)  after you have not eaten for a while (fasting). You may have this done every 1-3 years.  Bone density scan. This is done to screen for  osteoporosis. You may have this done starting at age 22.  Mammogram. This may be done every 1-2 years. Talk to your health care provider about how often you should have regular mammograms. Talk with your health care provider about your test results, treatment options, and if necessary, the need for more tests. Vaccines  Your health care provider may recommend certain vaccines, such as:  Influenza vaccine. This is recommended every year.  Tetanus, diphtheria, and acellular pertussis (Tdap, Td) vaccine. You may need a Td booster every 10 years.  Zoster vaccine. You may need this after age 58.  Pneumococcal 13-valent conjugate (PCV13) vaccine. One dose is recommended after age 12.  Pneumococcal polysaccharide (PPSV23) vaccine. One dose is recommended after age 5. Talk to your health care provider about which screenings and vaccines you need and how often you need them. This information is not intended to replace advice given to you by your health care provider. Make sure you discuss any questions you have with your health care provider. Document Released: 06/27/2015 Document Revised: 02/18/2016 Document Reviewed: 04/01/2015 Elsevier Interactive Patient Education  2017 [employer] Prevention in the Home Falls can cause injuries. They can happen to people of all ages. There are many things you can do to make your home safe and to help prevent falls. What can I do on the outside of my home?  Regularly fix the edges of walkways and driveways and fix any cracks.  Remove anything that might make you trip as you walk through a door, such as a raised step or threshold.  Trim any bushes or trees on the path to your home.  Use bright outdoor lighting.  Clear any walking paths of anything that might make someone trip, such as rocks or tools.  Regularly check to see if handrails are loose or broken. Make sure that both sides of any steps have handrails.  Any raised decks and porches  should have guardrails on the edges.  Have any leaves, snow, or ice cleared regularly.  Use sand or salt on walking paths during winter.  Clean up any spills in your garage right away. This includes oil or grease spills. What can I do in the bathroom?  Use night lights.  Install grab bars by the toilet and in the tub and shower. Do not use towel bars as grab bars.  Use non-skid mats or decals in the tub or shower.  If you need to sit down in the shower, use a plastic, non-slip stool.  Keep the floor dry. Clean up any water that spills on the floor as soon as it happens.  Remove soap buildup in the tub or shower regularly.  Attach bath mats securely with double-sided non-slip rug tape.  Do not have throw rugs and other things on the floor that can make you trip. What can I do in the bedroom?  Use night lights.  Make sure that you have a light by your bed that is easy to reach.  Do not use any sheets or blankets that are too big for your bed. They should not hang down onto the floor.  Have a firm chair that has side arms. You can use this for support while you get dressed.  Do not have throw rugs and other things on the floor that can make you  trip. What can I do in the kitchen?  Clean up any spills right away.  Avoid walking on wet floors.  Keep items that you use a lot in easy-to-reach places.  If you need to reach something above you, use a strong step stool that has a grab bar.  Keep electrical cords out of the way.  Do not use floor polish or wax that makes floors slippery. If you must use wax, use non-skid floor wax.  Do not have throw rugs and other things on the floor that can make you trip. What can I do with my stairs?  Do not leave any items on the stairs.  Make sure that there are handrails on both sides of the stairs and use them. Fix handrails that are broken or loose. Make sure that handrails are as long as the stairways.  Check any carpeting to  make sure that it is firmly attached to the stairs. Fix any carpet that is loose or worn.  Avoid having throw rugs at the top or bottom of the stairs. If you do have throw rugs, attach them to the floor with carpet tape.  Make sure that you have a light switch at the top of the stairs and the bottom of the stairs. If you do not have them, ask someone to add them for you. What else can I do to help prevent falls?  Wear shoes that:  Do not have high heels.  Have rubber bottoms.  Are comfortable and fit you well.  Are closed at the toe. Do not wear sandals.  If you use a stepladder:  Make sure that it is fully opened. Do not climb a closed stepladder.  Make sure that both sides of the stepladder are locked into place.  Ask someone to hold it for you, if possible.  Clearly mark and make sure that you can see:  Any grab bars or handrails.  First and last steps.  Where the edge of each step is.  Use tools that help you move around (mobility aids) if they are needed. These include:  Canes.  Walkers.  Scooters.  Crutches.  Turn on the lights when you go into a dark area. Replace any light bulbs as soon as they burn out.  Set up your furniture so you have a clear path. Avoid moving your furniture around.  If any of your floors are uneven, fix them.  If there are any pets around you, be aware of where they are.  Review your medicines with your doctor. Some medicines can make you feel dizzy. This can increase your chance of falling. Ask your doctor what other things that you can do to help prevent falls. This information is not intended to replace advice given to you by your health care provider. Make sure you discuss any questions you have with your health care provider. Document Released: 03/27/2009 Document Revised: 11/06/2015 Document Reviewed: 07/05/2014 Elsevier Interactive Patient Education  2017 [employer].

## 2020-01-18 ENCOUNTER — Other Ambulatory Visit: Payer: Self-pay | Admitting: Internal Medicine

## 2020-01-20 ENCOUNTER — Other Ambulatory Visit: Payer: Self-pay | Admitting: Internal Medicine

## 2020-01-24 ENCOUNTER — Other Ambulatory Visit: Payer: Self-pay

## 2020-01-24 ENCOUNTER — Encounter: Payer: Self-pay | Admitting: Primary Care

## 2020-01-24 ENCOUNTER — Ambulatory Visit: Payer: Medicare Other | Admitting: Primary Care

## 2020-01-24 DIAGNOSIS — J849 Interstitial pulmonary disease, unspecified: Secondary | ICD-10-CM

## 2020-01-24 DIAGNOSIS — J9611 Chronic respiratory failure with hypoxia: Secondary | ICD-10-CM | POA: Diagnosis not present

## 2020-01-24 DIAGNOSIS — J449 Chronic obstructive pulmonary disease, unspecified: Secondary | ICD-10-CM | POA: Diagnosis not present

## 2020-01-24 NOTE — Patient Instructions (Addendum)
Pleasure seeing you today Crystal [name]  Testing: - Pulmonary function testing today showed mild restriction (borderline obstruction), decreased diffusion capacity  - Most consistent with underlying asthma/ mild COPD   Oxygen: You need to use ____0_____ L at rest; ______2_____ L on exertion Titrate oxygen 0-2L to keep O2 > 90%  Orders: HRCT re: ILD  Follow-up: 4-6 months with Dr. Shearon Stalls

## 2020-01-24 NOTE — Progress Notes (Signed)
@Patient  ID: Crystal [name], female    DOB: 11-17-1945, 74 y.o.   MRN: [id number]  Chief Complaint  Patient presents with  . Follow-up    Had PFT 12-05-19. Sob-same, occass. cough dry.    Referring provider: [name], MD  HPI: 74 year old female. Former smoker quit in 1990. PMH significant for afib, HTN, chronic obstructive airway disease with asthma. Patient of Dr. Shearon Stalls, seen for initial consult on 10/22/19.   01/24/2020 Patient presents today for 3 month follow-up visit. During last visit she was given home oxygen and ordered for PFTs. She has stopped amiodarone. She did not have any evidence of ground glass on CT scan. Oral steriods not recommended at this time. She continues Symbicort twice daily and theophylline. Patient states that her breathing has been really good. Reports having more energy. She wears 1.5L oxygen during the day at rest and on exertion. Denies chest tightness, wheezing or cough.   Pulmonary function testing: 01/24/2020 - FVC 2.34 (83%), FEV1 1.66 (78%), ratio 71, TLC 98%, DLCOcor 9.36 (48%)  Imaging: 08/07/19 CT chest wo contrast- Stable appearance of a 6 mm nodule in the right middle lobe. No further imaging is warranted if patient is low risk. Diffuse subpleural reticular opacities, increasing from the comparison exam without discrete honeycombing at this time but with a subpleural predominance and developing bronchiectasis. No evidence of subpleural sparing Findings are categorized as probable UIP  Allergies  Allergen Reactions  . Simvastatin     myalgias    Immunization History  Administered Date(s) Administered  . Fluad Quad(high Dose 65+) 02/09/2019  . Influenza Split 03/14/2012  . Influenza Whole 04/15/2003, 03/05/2008, 06/22/2008, 04/13/2013  . Influenza, High Dose Seasonal PF 03/22/2014, 03/16/2018  . Influenza-Unspecified 03/14/2014, 04/23/2015, 04/23/2016, 03/14/2017  . PFIZER SARS-COV-2 Vaccination 07/21/2019, 08/15/2019  . Pneumococcal  Conjugate-13 07/18/2015  . Pneumococcal Polysaccharide-23 06/15/1995, 06/05/2010  . Tdap 06/08/2012  . Zoster 06/13/2009  . Zoster Recombinat (Shingrix) 03/03/2017, 05/14/2017    Past Medical History:  Diagnosis Date  . Adenomatous colon polyp 2007 & 2010    Dr [name]  . Allergy    seasonal  . Arthritis   . Asthma   . Cataract   . Diverticulosis   . 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 Hyperlipidemia   . Hypertension   . IBS (irritable bowel syndrome)   . Legally blind   . MVP (mitral valve prolapse)   . Paroxysmal atrial fibrillation with rapid ventricular response ([place]) 08/29/2017  . Peripheral vascular disease ([place])   . RAD (reactive airway disease)     Tobacco History: Social History   Tobacco Use  Smoking Status Former Smoker  . Packs/day: 1.00  . Years: 23.00  . Pack years: 23.00  . Quit date: 06/14/1989  . Years since quitting: 30.6  Smokeless Tobacco Never Used  Tobacco Comment   smoked 1961-1991 , up to < 1 ppd   Counseling given: Not Answered Comment: smoked 1961-1991 , up to < 1 ppd   Outpatient Medications Prior to Visit  Medication Sig Dispense Refill  . albuterol (VENTOLIN HFA) 108 (90 Base) MCG/ACT inhaler INHALE ONE PUFF BY MOUTH TWICE A DAY AS NEEDED 8.5 g 1  . alendronate (FOSAMAX) 70 MG tablet TAKE 1 TABLET BY MOUTH ONCE WEEKLY ON AN EMPTY STOMACH BEFORE BREAKFAST. REMAIN UPRIGHT FOR 30 MINUTES & TAKE WITH 8 OUNCES OF WATER    . Calcium Citrate-Vitamin D (CALCIUM + D PO) Take 1 tablet by mouth daily.    Marland Kitchen  Cholecalciferol (VITAMIN D3) 1000 units CAPS Take 1 capsule (1,000 Units total) by mouth daily with lunch. 60 capsule 0  . diclofenac Sodium (VOLTAREN) 1 % GEL Voltaren 1 % topical gel    . diltiazem (CARDIZEM CD) 180 MG 24 hr capsule Take 1 capsule (180 mg total) by mouth daily. 90 capsule 3  . furosemide (LASIX) 40 MG tablet TAKE ONE TABLET BY MOUTH DAILY 90 tablet 0  . gabapentin (NEURONTIN) 300 MG capsule Take 2 capsules (600  mg total) by mouth at bedtime. 180 capsule 1  . HYDROcodone-acetaminophen (NORCO/VICODIN) 5-325 MG tablet hydrocodone 5 mg-acetaminophen 325 mg tablet  Take 1 tablet every 6 hours by oral route as needed.    Marland Kitchen levothyroxine (SYNTHROID) 75 MCG tablet Take 1 tablet (75 mcg total) by mouth daily. 90 tablet 3  . LORazepam (ATIVAN) 1 MG tablet TAKE ONE TABLET BY MOUTH EVERY NIGHT AT BEDTIME 30 tablet 3  . methocarbamol (ROBAXIN) 500 MG tablet TAKE ONE TABLET BY MOUTH EVERY 6 HOURS AS NEEDED FOR MUSCLE SPASMS 120 tablet 2  . montelukast (SINGULAIR) 10 MG tablet TAKE ONE TABLET BY MOUTH EVERY NIGHT AT BEDTIME 90 tablet 0  . potassium chloride SA (KLOR-CON M20) 20 MEQ tablet Take 3 tablets (60 mEq total) by mouth daily. 270 tablet 1  . SYMBICORT 160-4.5 MCG/ACT inhaler INHALE 1 TO 2 PUFFS BY MOUTH EVERY 12 HOURS THEN GARGLE AND SPIT AFTER USE 10.2 g 9  . theophylline (UNIPHYL) 400 MG 24 hr tablet TAKE 1/2 TABLET BY MOUTH TWO TIMES A DAY 90 tablet 1  . traMADol (ULTRAM) 50 MG tablet Take 1 tablet (50 mg total) by mouth every 6 (six) hours as needed for moderate pain. 90 tablet 0  . amoxicillin (AMOXIL) 500 MG tablet Take 4 tablets by mouth as needed. Pt take 4 tablets prior dental work (Patient not taking: Reported on 01/24/2020)    . diltiazem (TIAZAC) 180 MG 24 hr capsule Take 180 mg by mouth daily. (Patient not taking: Reported on 01/24/2020)    . ELIQUIS 5 MG TABS tablet TAKE 1 TABLET BY MOUTH TWO TIMES A DAY (Patient not taking: Reported on 01/24/2020) 180 tablet 1   No facility-administered medications prior to visit.    Review of Systems  Review of Systems  Constitutional: Negative.   Respiratory: Negative for cough, chest tightness, shortness of breath and wheezing.   Cardiovascular: Positive for leg swelling.    Physical Exam  BP 132/64 (BP Location: Right Arm, Cuff Size: Normal)   Pulse 79   Temp 97.8 F (36.6 C) (Oral)   Ht 5\' 2"  (1.575 m)   Wt 130 lb 3.2 oz (59.1 kg)   SpO2 98%    BMI 23.81 kg/m  Physical Exam Constitutional:      General: She is not in acute distress.    Appearance: Normal appearance. She is not ill-appearing.  Cardiovascular:     Rate and Rhythm: Normal rate. Rhythm irregular.  Pulmonary:     Effort: Pulmonary effort is normal.     Breath sounds: No wheezing or rhonchi.  Neurological:     General: No focal deficit present.     Mental Status: She is alert and oriented to person, place, and time. Mental status is at baseline.  Psychiatric:        Mood and Affect: Mood normal.        Behavior: Behavior normal.        Thought Content: Thought content normal.  Judgment: Judgment normal.      Lab Results:  CBC    Component Value Date/Time   WBC 15.3 (H) 01/14/2020 1128   RBC 4.63 01/14/2020 1128   HGB 14.4 01/14/2020 1128   HGB 14.5 05/15/2018 1432   HCT 45.0 01/14/2020 1128   HCT 42.9 05/15/2018 1432   PLT 423 (H) 01/14/2020 1128   PLT 315 05/15/2018 1432   MCV 97.2 01/14/2020 1128   MCV 104 (H) 05/15/2018 1432   MCH 31.1 01/14/2020 1128   MCHC 32.0 01/14/2020 1128   RDW 17.2 (H) 01/14/2020 1128   RDW 14.3 05/15/2018 1432   LYMPHSABS 903 01/14/2020 1128   MONOABS 1.4 (H) 07/17/2019 1124   EOSABS 214 01/14/2020 1128   BASOSABS 214 (H) 01/14/2020 1128    BMET    Component Value Date/Time   NA 138 01/14/2020 1128   NA 140 05/26/2018 0941   K 3.2 (L) 01/14/2020 1128   CL 97 (L) 01/14/2020 1128   CO2 28 01/14/2020 1128   GLUCOSE 92 01/14/2020 1128   GLUCOSE 83 06/01/2006 0950   BUN 9 01/14/2020 1128   BUN 28 (H) 05/26/2018 0941   CREATININE 0.77 01/14/2020 1128   CALCIUM 9.6 01/14/2020 1128   GFRNONAA 76 01/14/2020 1128   GFRAA 88 01/14/2020 1128    BNP    Component Value Date/Time   BNP 123.0 (H) 07/10/2018 1916    ProBNP No results found for: PROBNP  Imaging: No results found.   Assessment & Plan:   Chronic obstructive airway disease with asthma ([place]) - She is doing significantly better, no  recent exacerbations. Reports having more energy  - Pulmonary function testing today showed mild restriction/ borderline obstruction with decreased diffusion capacity. Most consistent with underlying obstructive asthma.  - Continue Symbicort 160 two puffs twice daily, Singulair 10mg  daily and Theophylline 200mg  BID  Chronic respiratory failure with hypoxia (H[facility]) - Patient does not need to use oxygen at rest. Continue 2L on exertion  - Titrate oxygen 0-2L to keep O2 > 90%  ILD (interstitial lung disease) ([place]) - CT chest wo contrast in February 2021 showed Diffuse subpleural reticular opacities, increasing from the comparison exam without discrete honeycombing. PFTs on 01/24/20 showed DLCO unc 9.36 (48%). Patient has stopped amiodarone. Due for 6 month follow-up HRCT to better evaluate underling ILD    Martyn Ehrich, NP 01/29/2020

## 2020-01-25 DIAGNOSIS — S72001A Fracture of unspecified part of neck of right femur, initial encounter for closed fracture: Secondary | ICD-10-CM | POA: Diagnosis not present

## 2020-01-25 DIAGNOSIS — S72001D Fracture of unspecified part of neck of right femur, subsequent encounter for closed fracture with routine healing: Secondary | ICD-10-CM | POA: Diagnosis not present

## 2020-01-25 DIAGNOSIS — J449 Chronic obstructive pulmonary disease, unspecified: Secondary | ICD-10-CM | POA: Diagnosis not present

## 2020-01-28 NOTE — Chronic Care Management (AMB) (Signed)
Chronic Care Management Pharmacy  Name: Crystal [name]  MRN: [id number] DOB: 09/08/1945   Chief Complaint/ HPI  Crystal [name],  74 y.o. , female presents for their Initial CCM visit with the clinical pharmacist via telephone due to COVID-19 Pandemic  PCP : [name], MD Patient Care Team: [name], MD as PCP - General (Internal Medicine) Stanford Breed Denice Bors, MD as PCP - Cardiology (Cardiology) Charlton Haws, [facility] as Pharmacist (Pharmacist)  Pt has lived in [place] 30 years, originally from [place], husband came to [place] for [employer]. Son and DIL live in [place] as well and visit often. Spends a lot of time going to doctor's visits. She lost her sight several years ago due to arteritis which limits her daily activities. Her husband takes care of managing medications.  Their chronic conditions include: Hypertension, Hyperlipidemia, Atrial Fibrillation, COPD, Asthma, Hypothyroidism, Osteoporosis, Osteoarthritis, Gout and Hx femoral neck fracture and vertebral compression fracture, femoral artery thrombosis, hx giant cell arteritis causing blindness  Office Visits: 01/14/20 Dr [name] OV: chronic f/u, conditions stable, no med changes. Pt does not take furosemide on days she has appts (d/t urination), counseled to take when she gets home if appt is early enough. Increased levothyroxine 50 > 75 mcg due to high TSH, increased KCl to 60 mEq d/t low K.  09/21/19 Dr [name] OV: ordered DEXA, acute gout tx'd with prednisone taper.  Consult Visit: 01/24/20 NP Geraldo Pitter (pulmonary): f/u for ILD, PFT showed mild restriction, borderline obstruction, c/w asthma/mild COPD. Pt wears O2 during the day at rest, on 1.5 L.  01/06/20 ED visit: neck/back pain, left without being seen.  12/09/19 UC visit: acute gout dx, tx'd with prednisone taper  12/06/19 outpatient PT neurorehabilitation - for repeated falls, BPPV.  10/29/19 Dr Stanford Breed OV: f/u for Afib (08/2017). Cardoversion 06/2018. Started  amiodarone 06/2019, dc'd due to possible UIP on chest CT. Increased diltiazem to 180 mg.  10/22/19 Dr Shearon Stalls (pulmonary): ACT score 16. Rx'd home O2. 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 Unclear how beneficial theophylline is.   Allergies  Allergen Reactions  . Simvastatin     myalgias    Medications: Outpatient Encounter Medications as of 01/29/2020  Medication Sig Note  . albuterol (VENTOLIN HFA) 108 (90 Base) MCG/ACT inhaler INHALE ONE PUFF BY MOUTH TWICE A DAY AS NEEDED   . alendronate (FOSAMAX) 70 MG tablet TAKE 1 TABLET BY MOUTH ONCE WEEKLY ON AN EMPTY STOMACH BEFORE BREAKFAST. REMAIN UPRIGHT FOR 30 MINUTES & TAKE WITH 8 OUNCES OF WATER   . Calcium Citrate-Vitamin D (CALCIUM + D PO) Take 1 tablet by mouth daily.   . Cholecalciferol (VITAMIN D3) 1000 units CAPS Take 1 capsule (1,000 Units total) by mouth daily with lunch.   . diclofenac Sodium (VOLTAREN) 1 % GEL Voltaren 1 % topical gel   . diltiazem (TIAZAC) 180 MG 24 hr capsule Take 180 mg by mouth daily.    . furosemide (LASIX) 40 MG tablet TAKE ONE TABLET BY MOUTH DAILY   . gabapentin (NEURONTIN) 300 MG capsule Take 2 capsules (600 mg total) by mouth at bedtime.   Marland Kitchen levothyroxine (SYNTHROID) 75 MCG tablet Take 1 tablet (75 mcg total) by mouth daily.   Marland Kitchen LORazepam (ATIVAN) 1 MG tablet TAKE ONE TABLET BY MOUTH EVERY NIGHT AT BEDTIME   . methocarbamol (ROBAXIN) 500 MG tablet TAKE ONE TABLET BY MOUTH EVERY 6 HOURS AS NEEDED FOR MUSCLE SPASMS   . montelukast (SINGULAIR) 10 MG tablet TAKE  ONE TABLET BY MOUTH EVERY NIGHT AT BEDTIME   . potassium chloride SA (KLOR-CON M20) 20 MEQ tablet Take 3 tablets (60 mEq total) by mouth daily.   . SYMBICORT 160-4.5 MCG/ACT inhaler INHALE 1 TO 2 PUFFS BY MOUTH EVERY 12 HOURS THEN GARGLE AND SPIT AFTER USE   . theophylline (UNIPHYL) 400 MG 24 hr tablet TAKE 1/2 TABLET BY MOUTH TWO TIMES A DAY   . traMADol (ULTRAM) 50 MG tablet Take 1 tablet (50 mg total) by mouth every 6 (six) hours as  needed for moderate pain.   Marland Kitchen amoxicillin (AMOXIL) 500 MG tablet Take 4 tablets by mouth as needed. Pt take 4 tablets prior dental work (Patient not taking: Reported on 01/24/2020)   . diltiazem (CARDIZEM CD) 180 MG 24 hr capsule Take 1 capsule (180 mg total) by mouth daily.   Marland Kitchen ELIQUIS 5 MG TABS tablet TAKE 1 TABLET BY MOUTH TWO TIMES A DAY (Patient not taking: Reported on 01/24/2020) 01/29/2020: Per patient she has discussed holding with cardiologist due to maintaining NSR for long period of time  . HYDROcodone-acetaminophen (NORCO/VICODIN) 5-325 MG tablet hydrocodone 5 mg-acetaminophen 325 mg tablet  Take 1 tablet every 6 hours by oral route as needed.    No facility-administered encounter medications on file as of 01/29/2020.     Current Diagnosis/Assessment:  SDOH Interventions     Most Recent Value  SDOH Interventions  Financial Strain Interventions Intervention Not Indicated      Goals Addressed            This Visit's Progress   . Pharmacy Care Plan       CARE PLAN ENTRY (see longitudinal plan of care for additional care plan information)  Current Barriers:  . Chronic Disease Management support, education, and care coordination needs related to Hypertension, Hyperlipidemia, Atrial Fibrillation, COPD, and Asthma   Hypertension / Atrial fibrillation BP Readings from Last 3 Encounters:  01/24/20 132/64  01/14/20 (!) 116/62  10/29/19 (!) 142/66 .  Pharmacist Clinical Goal(s): o Over the next 180 days, patient will work with PharmD and providers to achieve BP goal <130/80 and optimize anticoagulation therapy . Current regimen:  o Diltiazem ER 180 mg daily o Eliquis 5 mg BID  (not taking currently) o Furosemide 40 mg daily as needed . Interventions: o Discussed risks/benefits of Eliquis; benefits may outweigh risks as long as there are no bleeding issues o Discussed avoiding NSAIDs while taking Eliquis . Patient self care activities - Over the next 180 days, patient  will: o Decide whether to continue Eliquis with input from cardiologist  Hyperlipidemia Lab Results  Component Value Date/Time   LDLCALC 118 (H) 01/14/2020 11:28 AM   LDLCALC 116 (H) 06/19/2014 10:16 AM   LDLDIRECT 116.6 06/13/2013 09:21 AM .  Pharmacist Clinical Goal(s): o Over the next 180 days, patient will work with PharmD and providers to achieve LDL goal < 100 . Current regimen:  o No medications . Interventions: o Discussed cholesterol goals and benefits of medication for prevention of heart attack / stroke . Patient self care activities - Over the next 180 days, patient will: o Continue low cholesterol diet  COPD / Asthma . Pharmacist Clinical Goal(s) o Over the next 180 days, patient will work with PharmD and providers to optimize therapy . Current regimen:  o Symbicort 160-4.5 mcg/act 1-2 puffs every 12 hrs o Albuterol HFA as needed o Theophylline 400 mg - 1/2 tablet twice a day o Montelukast 10 mg at bedtime . Interventions:  o Discussed minimal benefit from theophylline . Patient self care activities - Over the next 180 days, patient will: o Follow up with pulmonary as scheduled to discuss changing theophylline  Medication management . Pharmacist Clinical Goal(s): o Over the next 180 days, patient will work with PharmD and providers to maintain optimal medication adherence . [facility] . Interventions o Comprehensive medication review performed. o Continue current medication management strategy . Patient self care activities - Over the next 180 days, patient will: o Focus on medication adherence by pill box o Take medications as prescribed o Report any questions or concerns to PharmD and/or provider(s)  Initial goal documentation       AFIB / Hypertension   Patient is currently rate controlled. Office heart rates are  Pulse Readings from Last 3 Encounters:  01/24/20 79  01/14/20 80  12/09/19 75   CHA2DS2-VASc Score = 4  The  patient's score is based upon: CHF History: 0 HTN History: 1 Age : 2 Diabetes History: 0 Stroke History: 0 Vascular Disease History: 0 Gender: 1   BP goal is:  <130/80  Office blood pressures are  BP Readings from Last 3 Encounters:  01/24/20 132/64  01/14/20 (!) 116/62  10/29/19 (!) 142/66   Patient checks BP at home infrequently Patient home BP readings are ranging: n/a  Patient has failed these meds in past: n/a Patient is currently controlled on the following medications:  . Diltiazem ER 180 mg daily . Eliquis 5 mg BID  (not taking) . Furosemide 40 mg daily PRN  We discussed: pt has discussed stopping Eliquis with her cardiologist (due to maintaining NSR for long period of time), and pt reports he has told her it is her decision to continue or stop it. Currently she is not taking it, but sometimes she changes her mind and starts it again. Pt denies hx bleeding with Eliquis, and cost is not an issue for her - discussed benefits outweigh risks at this point, but ultimately it is still patient's decision. Pt is still thinking about it and may decide to start Eliquis again.  Plan  Continue current medications    Hyperlipidemia   LDL goal < 100  Lipid Panel     Component Value Date/Time   CHOL 293 (H) 01/14/2020 1128   CHOL 281 (H) 06/19/2014 1016   TRIG 129 01/14/2020 1128   TRIG 58 06/19/2014 1016   TRIG 51 06/01/2006 0950   HDL 151 01/14/2020 1128   HDL 153 06/19/2014 1016   LDLCALC 118 (H) 01/14/2020 1128   LDLCALC 116 (H) 06/19/2014 1016   LDLDIRECT 116.6 06/13/2013 0921    Hepatic Function Latest Ref Rng & Units 01/14/2020 07/17/2019 02/05/2019  Total Protein 6.1 - 8.1 g/dL 6.8 6.7 6.5  Albumin 3.5 - 5.2 g/dL - 4.1 3.8  AST 10 - 35 U/L 22 22 71(H)  ALT 6 - 29 U/L 7 15 34  Alk Phosphatase 39 - 117 U/L - 95 115  Total Bilirubin 0.2 - 1.2 mg/dL 0.8 0.8 1.3(H)  Bilirubin, Direct 0.0 - 0.3 mg/dL - - -     The ASCVD Risk score (Ramireno., et al., 2013) failed  to calculate for the following reasons:   The valid HDL cholesterol range is 20 to 100 mg/dL   Patient has failed these meds in past: simvastatin (myalgia) Patient is currently controlled on the following medications:  . No medications  We discussed:  diet and exercise extensively  Plan  Continue  control with diet and exercise  COPD / Asthma   Last spirometry score 12/05/19: FEV1 78%, FEV1/FVC 0.71  Gold Grade: Gold 2 (FEV1 50-79%) Current COPD Classification:  A (low sx, <2 exacerbations/yr)  Lab Results  Component Value Date/Time   EOSPCT 1.4 01/14/2020 11:28 AM   EOSABS 214 01/14/2020 11:28 AM   Patient has failed these meds in past: n/a Patient is currently controlled on the following medications:  Marland Kitchen Symbicort 160-4.5 mcg/act 1-2 puffs q12h  . Albuterol HFA prn . Theophylline 400 mg - 1/2 tab BID . Montelukast 10 mg HS  Using maintenance inhaler regularly? Yes Frequency of rescue inhaler use:  1-2x per week  Triggers - perfume, gasoline. Occasionally husband will let her know when she is wheezing because she doesn't realize. Has O2 but doesn't wear it very often, O2sat usually high 80s.   We discussed:  Pt reports triggers include hot weather, perfume, gasoline. She has not been spending time outside lately so she has not needed her rescue inhaler much. Her husband will let her know when she is wheezing because she often does not realize.   Also discussed marginal benefit of theophylline; pt has discussed changing this with pulmonologist, plans to change it once she returns from maternity leave in January.  Plan  Continue current medications  F/U with pulmonary as scheduled to change theophylline  Hypothyroidism   Lab Results  Component Value Date/Time   TSH 5.17 (H) 01/14/2020 11:29 AM   TSH 3.38 07/17/2019 11:24 AM   FREET4 1.00 04/11/2018 10:53 AM   FREET4 1.04 03/22/2018 11:02 AM   Patient has failed these meds in past: n/a Patient is currently controlled  on the following medications:  . Levothyroxine 75 mcg daily (new dose 01/14/20)  We discussed:  Pt has picked up new dose, denies issues. She does take levothyroxine on empty stomach but with other AM medications, discussed optimal administration by itself.   Plan  Continue current medications  Osteoporosis   Last DEXA Scan: 07/29/2017, results not in chart, started alendronate per rheum  VITD  Date Value Ref Range Status  07/17/2019 28.23 (L) 30.00 - 100.00 ng/mL Final   Patient has failed these meds in past: n/a Patient is currently controlled on the following medications:  . Alendronate 70 mg weekly (since 07/2017) - Mondays . Calcium citrate-Vitamin D . Vitamin D 1000 IU daily  We discussed:  Recommend [phone] units of vitamin D daily. Recommend 1200 mg of calcium daily from dietary and supplemental sources. Counseled on oral bisphosphonate administration: take in the morning, 30 minutes prior to food with 6-8 oz of water. Do not lie down for at least 30 minutes after taking.  Plan  Continue current medications  Pain   Osteoarthritis Back pain (herniated discs)/ Bursitis  Patient has failed these meds in past: hydrocodone-APAP Patient is currently uncontrolled on the following medications:  . Gabapentin 300 mg - 2 capsules HS (headaches - arteritis) . Methocarbamol 500 mg q6h PRN (back) . Tramadol 50 mg q6h PRN (back) . Diclofenac 1% gel PRN - neck and shoulders  We discussed:  Pt receives injections via ortho. Recently pain has increased so she has f/u today. Current regimen helps manage pain but still having significant breakthrough per pt.  Plan  Continue current medications  F/U with ortho as scheduled  Anxiety   No flowsheet data found.  Patient has failed these meds in past: alprazolam, escitalopram, diazepam, sertraline Patient is currently controlled on the following medications:  .  Lorazepam 1 mg HS  We discussed: Pt takes nightly for "sleep and  depression"; she used to take Xanax but reports she got "lectured" by every doctor about it and asked to switch to something else. Discussed lorazepam and Xanax are in the same class of medications, pt denies oversedation or desire to discontinue.  Plan  Continue current medications  Health Maintenance   Lab Results  Component Value Date/Time   K 3.2 (L) 01/14/2020 11:28 AM   K 3.9 07/17/2019 11:24 AM   Patient is currently controlled on the following medications:  . Potassium chloride 20 mEq - 3 tablets daily  We discussed: Pt has difficulty swallowing KCl tablets, she separates them TID rather than taking them all at once  Plan  Continue current medications  Medication Management   Pt uses [facility] for all medications Uses pill box? Yes Pt endorses 100% compliance  We discussed: Pt's husband manages her medications for her; pt is satisfied with current pharmacy services  Plan  Continue current medication management strategy    Follow up: 6 month phone visit  Charlene Brooke, PharmD, BCACP Clinical Pharmacist [facility] [phone]

## 2020-01-29 ENCOUNTER — Ambulatory Visit: Payer: Medicare Other | Admitting: Pharmacist

## 2020-01-29 ENCOUNTER — Other Ambulatory Visit: Payer: Self-pay

## 2020-01-29 DIAGNOSIS — I480 Paroxysmal atrial fibrillation: Secondary | ICD-10-CM

## 2020-01-29 DIAGNOSIS — E782 Mixed hyperlipidemia: Secondary | ICD-10-CM

## 2020-01-29 DIAGNOSIS — J449 Chronic obstructive pulmonary disease, unspecified: Secondary | ICD-10-CM

## 2020-01-29 DIAGNOSIS — I10 Essential (primary) hypertension: Secondary | ICD-10-CM

## 2020-01-29 DIAGNOSIS — J849 Interstitial pulmonary disease, unspecified: Secondary | ICD-10-CM | POA: Insufficient documentation

## 2020-01-29 DIAGNOSIS — S139XXA Sprain of joints and ligaments of unspecified parts of neck, initial encounter: Secondary | ICD-10-CM | POA: Diagnosis not present

## 2020-01-29 DIAGNOSIS — J9611 Chronic respiratory failure with hypoxia: Secondary | ICD-10-CM | POA: Insufficient documentation

## 2020-01-29 NOTE — Assessment & Plan Note (Addendum)
-   She is doing significantly better, no recent exacerbations. Reports having more energy  - Pulmonary function testing today showed mild restriction/ borderline obstruction with decreased diffusion capacity. Most consistent with underlying obstructive asthma.  - Continue Symbicort 160 two puffs twice daily, Singulair 10mg  daily and Theophylline 200mg  BID

## 2020-01-29 NOTE — Assessment & Plan Note (Addendum)
-   CT chest wo contrast in February 2021 showed Diffuse subpleural reticular opacities, increasing from the comparison exam without discrete honeycombing. PFTs on 01/24/20 showed DLCO unc 9.36 (48%). Patient has stopped amiodarone. 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

## 2020-01-29 NOTE — Patient Instructions (Addendum)
Visit Information  Phone number for Pharmacist: [phone]  Thank you for meeting with me to discuss your medications! I look forward to working with you to achieve your health care goals. Below is a summary of what we talked about during the visit:  Goals Addressed            This Visit's Progress   . Pharmacy Care Plan       CARE PLAN ENTRY (see longitudinal plan of care for additional care plan information)  Current Barriers:  . Chronic Disease Management support, education, and care coordination needs related to Hypertension, Hyperlipidemia, Atrial Fibrillation, COPD, and Asthma   Hypertension / Atrial fibrillation BP Readings from Last 3 Encounters:  01/24/20 132/64  01/14/20 (!) 116/62  10/29/19 (!) 142/66 .  Pharmacist Clinical Goal(s): o Over the next 180 days, patient will work with PharmD and providers to achieve BP goal <130/80 and optimize anticoagulation therapy . Current regimen:  o Diltiazem ER 180 mg daily o Eliquis 5 mg BID  (not taking currently) o Furosemide 40 mg daily as needed . Interventions: o Discussed risks/benefits of Eliquis; benefits may outweigh risks as long as there are no bleeding issues o Discussed avoiding NSAIDs while taking Eliquis . Patient self care activities - Over the next 180 days, patient will: o Decide whether to continue Eliquis with input from cardiologist  Hyperlipidemia Lab Results  Component Value Date/Time   LDLCALC 118 (H) 01/14/2020 11:28 AM   LDLCALC 116 (H) 06/19/2014 10:16 AM   LDLDIRECT 116.6 06/13/2013 09:21 AM .  Pharmacist Clinical Goal(s): o Over the next 180 days, patient will work with PharmD and providers to achieve LDL goal < 100 . Current regimen:  o No medications . Interventions: o Discussed cholesterol goals and benefits of medication for prevention of heart attack / stroke . Patient self care activities - Over the next 180 days, patient will: o Continue low cholesterol diet  COPD /  Asthma . Pharmacist Clinical Goal(s) o Over the next 180 days, patient will work with PharmD and providers to optimize therapy . Current regimen:  o Symbicort 160-4.5 mcg/act 1-2 puffs every 12 hrs o Albuterol HFA as needed o Theophylline 400 mg - 1/2 tablet twice a day o Montelukast 10 mg at bedtime . Interventions: o Discussed minimal benefit from theophylline . Patient self care activities - Over the next 180 days, patient will: o Follow up with pulmonary as scheduled to discuss changing theophylline  Medication management . Pharmacist Clinical Goal(s): o Over the next 180 days, patient will work with PharmD and providers to maintain optimal medication adherence . [facility] . Interventions o Comprehensive medication review performed. o Continue current medication management strategy . Patient self care activities - Over the next 180 days, patient will: o Focus on medication adherence by pill box o Take medications as prescribed o Report any questions or concerns to PharmD and/or provider(s)  Initial goal documentation       Ms. Messamore was given information about Chronic Care Management services today including:  1. CCM service includes personalized support from designated clinical staff supervised by her physician, including individualized plan of care and coordination with other care providers 2. 24/7 contact phone numbers for assistance for urgent and routine care needs. 3. Standard insurance, coinsurance, copays and deductibles apply for chronic care management only during months in which we provide at least 20 minutes of these services. Most insurances cover these services at 100%, however patients may be responsible  for any copay, coinsurance and/or deductible if applicable. This service may help you avoid the need for more expensive face-to-face services. 4. Only one practitioner may furnish and bill the service in a calendar month. 5. The patient may  stop CCM services at any time (effective at the end of the month) by phone call to the office staff.  Patient agreed to services and verbal consent obtained.   Patient verbalizes understanding of instructions provided today.  Telephone follow up appointment with pharmacy team member scheduled for: 6 months  Charlene Brooke, PharmD Clinical Pharmacist [facility] After Age 21 After age 45, you are at a higher risk for certain long-term diseases and infections as well as injuries from falls. Falls are a major cause of broken bones and head injuries in people who are older than age 64. Getting regular preventive care can help to keep you healthy and well. Preventive care includes getting regular testing and making lifestyle changes as recommended by your health care provider. Talk with your health care provider about:  Which screenings and tests you should have. A screening is a test that checks for a disease when you have no symptoms.  A diet and exercise plan that is right for you. What should I know about screenings and tests to prevent falls? Screening and testing are the best ways to find a health problem early. Early diagnosis and treatment give you the best chance of managing medical conditions that are common after age 103. Certain conditions and lifestyle choices may make you more likely to have a fall. Your health care provider may recommend:  Regular vision checks. Poor vision and conditions such as cataracts can make you more likely to have a fall. If you wear glasses, make sure to get your prescription updated if your vision changes.  Medicine review. Work with your health care provider to regularly review all of the medicines you are taking, including over-the-counter medicines. Ask your health care provider about any side effects that may make you more likely to have a fall. Tell your health care provider if any medicines  that you take make you feel dizzy or sleepy.  Osteoporosis screening. Osteoporosis is a condition that causes the bones to get weaker. This can make the bones weak and cause them to break more easily.  Blood pressure screening. Blood pressure changes and medicines to control blood pressure can make you feel dizzy.  Strength and balance checks. Your health care provider may recommend certain tests to check your strength and balance while standing, walking, or changing positions.  Foot health exam. Foot pain and numbness, as well as not wearing proper footwear, can make you more likely to have a fall.  Depression screening. You may be more likely to have a fall if you have a fear of falling, feel emotionally low, or feel unable to do activities that you used to do.  Alcohol use screening. Using too much alcohol can affect your balance and may make you more likely to have a fall. What actions can I take to lower my risk of falls? General instructions  Talk with your health care provider about your risks for falling. Tell your health care provider if: ? You fall. Be sure to tell your health care provider about all falls, even ones that seem minor. ? You feel dizzy, sleepy, or off-balance.  Take over-the-counter and prescription medicines only as told by your health care provider. These include any  supplements.  Eat a healthy diet and maintain a healthy weight. A healthy diet includes low-fat dairy products, low-fat (lean) meats, and fiber from whole grains, beans, and lots of fruits and vegetables. Home safety  Remove any tripping hazards, such as rugs, cords, and clutter.  Install safety equipment such as grab bars in bathrooms and safety rails on stairs.  Keep rooms and walkways well-lit. Activity   Follow a regular exercise program to stay fit. This will help you maintain your balance. Ask your health care provider what types of exercise are appropriate for you.  If you need a cane  or walker, use it as recommended by your health care provider.  Wear supportive shoes that have nonskid soles. Lifestyle  Do not drink alcohol if your health care provider tells you not to drink.  If you drink alcohol, limit how much you have: ? 0-1 drink a day for women. ? 0-2 drinks a day for men.  Be aware of how much alcohol is in your drink. In the [place] one drink equals one typical bottle of beer (12 oz), one-half glass of wine (5 oz), or one shot of hard liquor (1 oz).  Do not use any products that contain nicotine or tobacco, such as cigarettes and e-cigarettes. If you need help quitting, ask your health care provider. Summary  Having a healthy lifestyle and getting preventive care can help to protect your health and wellness after age 65.  Screening and testing are the best way to find a health problem early and help you avoid having a fall. Early diagnosis and treatment give you the best chance for managing medical conditions that are more common for people who are older than age 65.  Falls are a major cause of broken bones and head injuries in people who are older than age 46. Take precautions to prevent a fall at home.  Work with your health care provider to learn what changes you can make to improve your health and wellness and to prevent falls. This information is not intended to replace advice given to you by your health care provider. Make sure you discuss any questions you have with your health care provider. Document Revised: 09/21/2018 Document Reviewed: 04/13/2017 Elsevier Patient Education  2020 [employer].

## 2020-01-29 NOTE — Assessment & Plan Note (Addendum)
-   Patient does not need to use oxygen at rest. Continue 2L on exertion  - Titrate oxygen 0-2L to keep O2 > 90%

## 2020-01-31 NOTE — Addendum Note (Signed)
Addended by: Aviva Signs M on: 01/31/2020 02:55 PM   Modules accepted: Orders

## 2020-02-02 ENCOUNTER — Other Ambulatory Visit: Payer: Self-pay | Admitting: Internal Medicine

## 2020-02-02 ENCOUNTER — Other Ambulatory Visit: Payer: Self-pay | Admitting: Cardiology

## 2020-02-04 ENCOUNTER — Telehealth: Payer: Self-pay | Admitting: Internal Medicine

## 2020-02-04 ENCOUNTER — Telehealth: Payer: Self-pay | Admitting: Pharmacist

## 2020-02-04 MED ORDER — ALLOPURINOL 100 MG PO TABS
100.0000 mg | ORAL_TABLET | Freq: Every day | ORAL | 6 refills | Status: DC
Start: 2020-02-04 — End: 2020-03-12

## 2020-02-04 MED ORDER — PREDNISONE 10 MG PO TABS: ORAL_TABLET | ORAL | 0 refills | Status: DC

## 2020-02-04 NOTE — Telephone Encounter (Signed)
Please call her - she spoke with lindsey today and has a gout flare.    Prednisone taper sent to pharmacy.  I also want her to start daily allopurinol - this helps prevent gout.     rx's sent to [facility]

## 2020-02-04 NOTE — Progress Notes (Signed)
Received message from patient's husband Crystal [name] requesting treatment for gout flare.  Pt is complaining of gout in her R big toe, exactly the same as her previous gout attacks - swollen, red, painful, with some trouble walking. Symptoms have been there about a week. Pt has treated it so far with DMSO cream that has provided some relief. They are requesting treatment with a prednisone taper which has worked well in the past.  This is patient's 3rd gout attack this year, may also consider allopurinol for prevention of future attacks.  Forwarding request to PCP.

## 2020-02-05 NOTE — Telephone Encounter (Signed)
Left message today for patient with info.

## 2020-02-06 ENCOUNTER — Other Ambulatory Visit: Payer: Medicare Other

## 2020-02-06 ENCOUNTER — Other Ambulatory Visit: Payer: Self-pay

## 2020-02-06 ENCOUNTER — Encounter (HOSPITAL_COMMUNITY): Payer: Self-pay | Admitting: Emergency Medicine

## 2020-02-06 ENCOUNTER — Emergency Department (HOSPITAL_COMMUNITY)
Admission: EM | Admit: 2020-02-06 | Discharge: 2020-02-06 | Disposition: A | Discharge status: Home / Self Care | Payer: Medicare Other | Attending: Emergency Medicine | Admitting: Emergency Medicine

## 2020-02-06 DIAGNOSIS — Z79899 Other long term (current) drug therapy: Secondary | ICD-10-CM | POA: Insufficient documentation

## 2020-02-06 DIAGNOSIS — I10 Essential (primary) hypertension: Secondary | ICD-10-CM | POA: Insufficient documentation

## 2020-02-06 DIAGNOSIS — R1111 Vomiting without nausea: Secondary | ICD-10-CM | POA: Diagnosis not present

## 2020-02-06 DIAGNOSIS — Z7901 Long term (current) use of anticoagulants: Secondary | ICD-10-CM | POA: Insufficient documentation

## 2020-02-06 DIAGNOSIS — Y939 Activity, unspecified: Secondary | ICD-10-CM | POA: Insufficient documentation

## 2020-02-06 DIAGNOSIS — Z87891 Personal history of nicotine dependence: Secondary | ICD-10-CM | POA: Diagnosis not present

## 2020-02-06 DIAGNOSIS — D649 Anemia, unspecified: Secondary | ICD-10-CM

## 2020-02-06 DIAGNOSIS — R0902 Hypoxemia: Secondary | ICD-10-CM | POA: Diagnosis not present

## 2020-02-06 DIAGNOSIS — R110 Nausea: Secondary | ICD-10-CM | POA: Diagnosis not present

## 2020-02-06 DIAGNOSIS — Z7951 Long term (current) use of inhaled steroids: Secondary | ICD-10-CM | POA: Insufficient documentation

## 2020-02-06 DIAGNOSIS — H53133 Sudden visual loss, bilateral: Secondary | ICD-10-CM | POA: Diagnosis not present

## 2020-02-06 DIAGNOSIS — S81812A Laceration without foreign body, left lower leg, initial encounter: Secondary | ICD-10-CM | POA: Diagnosis not present

## 2020-02-06 DIAGNOSIS — Y929 Unspecified place or not applicable: Secondary | ICD-10-CM | POA: Diagnosis not present

## 2020-02-06 DIAGNOSIS — R42 Dizziness and giddiness: Secondary | ICD-10-CM | POA: Diagnosis not present

## 2020-02-06 DIAGNOSIS — Y999 Unspecified external cause status: Secondary | ICD-10-CM | POA: Insufficient documentation

## 2020-02-06 DIAGNOSIS — Z79890 Hormone replacement therapy (postmenopausal): Secondary | ICD-10-CM | POA: Insufficient documentation

## 2020-02-06 DIAGNOSIS — E039 Hypothyroidism, unspecified: Secondary | ICD-10-CM | POA: Diagnosis not present

## 2020-02-06 DIAGNOSIS — S8992XA Unspecified injury of left lower leg, initial encounter: Secondary | ICD-10-CM | POA: Diagnosis present

## 2020-02-06 DIAGNOSIS — W228XXA Striking against or struck by other objects, initial encounter: Secondary | ICD-10-CM | POA: Insufficient documentation

## 2020-02-06 DIAGNOSIS — N289 Disorder of kidney and ureter, unspecified: Secondary | ICD-10-CM | POA: Diagnosis not present

## 2020-02-06 DIAGNOSIS — J45909 Unspecified asthma, uncomplicated: Secondary | ICD-10-CM | POA: Diagnosis not present

## 2020-02-06 LAB — CBC WITH DIFFERENTIAL/PLATELET
Abs Immature Granulocytes: 0.56 10*3/uL — ABNORMAL HIGH (ref 0.00–0.07)
Basophils Absolute: 0.2 10*3/uL — ABNORMAL HIGH (ref 0.0–0.1)
Basophils Relative: 2 %
Eosinophils Absolute: 0.3 10*3/uL (ref 0.0–0.5)
Eosinophils Relative: 2 %
HCT: 38.4 % (ref 36.0–46.0)
Hemoglobin: 11.5 g/dL — ABNORMAL LOW (ref 12.0–15.0)
Immature Granulocytes: 3 %
Lymphocytes Relative: 20 %
Lymphs Abs: 3.4 10*3/uL (ref 0.7–4.0)
MCH: 31.3 pg (ref 26.0–34.0)
MCHC: 29.9 g/dL — ABNORMAL LOW (ref 30.0–36.0)
MCV: 104.3 fL — ABNORMAL HIGH (ref 80.0–100.0)
Monocytes Absolute: 2 10*3/uL — ABNORMAL HIGH (ref 0.1–1.0)
Monocytes Relative: 12 %
Neutro Abs: 10.1 10*3/uL — ABNORMAL HIGH (ref 1.7–7.7)
Neutrophils Relative %: 61 %
Platelets: 487 10*3/uL — ABNORMAL HIGH (ref 150–400)
RBC: 3.68 MIL/uL — ABNORMAL LOW (ref 3.87–5.11)
RDW: 19.7 % — ABNORMAL HIGH (ref 11.5–15.5)
WBC: 16.5 10*3/uL — ABNORMAL HIGH (ref 4.0–10.5)
nRBC: 0.3 % — ABNORMAL HIGH (ref 0.0–0.2)

## 2020-02-06 LAB — COMPREHENSIVE METABOLIC PANEL
ALT: 14 U/L (ref 0–44)
AST: 35 U/L (ref 15–41)
Albumin: 3.3 g/dL — ABNORMAL LOW (ref 3.5–5.0)
Alkaline Phosphatase: 95 U/L (ref 38–126)
Anion gap: 15 (ref 5–15)
BUN: 9 mg/dL (ref 8–23)
CO2: 25 mmol/L (ref 22–32)
Calcium: 8.8 mg/dL — ABNORMAL LOW (ref 8.9–10.3)
Chloride: 98 mmol/L (ref 98–111)
Creatinine, Ser: 0.85 mg/dL (ref 0.44–1.00)
GFR calc Af Amer: >60 mL/min (ref >60)
GFR calc non Af Amer: >60 mL/min (ref >60)
Glucose, Bld: 92 mg/dL (ref 70–99)
Potassium: 3.4 mmol/L — ABNORMAL LOW (ref 3.5–5.1)
Sodium: 138 mmol/L (ref 135–145)
Total Bilirubin: 1 mg/dL (ref 0.3–1.2)
Total Protein: 5.5 g/dL — ABNORMAL LOW (ref 6.5–8.1)

## 2020-02-06 NOTE — ED Notes (Signed)
Wound redressed in triage, bleeding controlled with pressure bandage at this time.

## 2020-02-06 NOTE — ED Provider Notes (Signed)
[facility]ERGENCY DEPARTMENT Provider Note   CSN: [id number] Arrival date & time: 02/06/20  1607     History Chief Complaint  Patient presents with  . Extremity Laceration    Crystal [name] is a 74 y.o. female with history of hypertension, hyperlipidemia, IBS, paroxysmal A. fib, peripheral vascular disease, gout presents for evaluation of acute onset, persistent bleeding wound to the left lower leg.  She states that at around 5 PM yesterday she accidentally struck her anterior left lower leg against her walker sustaining a laceration.  Denies head injury or loss of consciousness.  EMS was called out twice to help stop the bleeding and they recommended presentation to the ED for further evaluation.  Husband also applied quick clot powder with little improvement.  She noted some lightheadedness with standing today as well as some generalized weakness.  She has bilateral lower extremity edema which she reports is at baseline.  She states that at times at home she is hypoxic with O2 saturations in the 80s and when this happens she places herself on 1.5to 2 L via nasal cannula with improvement.  She has home O2.  She denies any chest pain or shortness of breath at this time.  Her tetanus is up-to-date.  She was previously on Eliquis which was discontinued 3 weeks ago at the recommendation of her cardiologist Dr. Stanford Breed as "I have not been in A. fib in some time".  The history is provided by the patient.       Past Medical History:  Diagnosis Date  . Adenomatous colon polyp 2007 & 2010    Dr [name]  . Allergy    seasonal  . Arthritis   . Asthma   . Cataract   . Diverticulosis   . 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 Hyperlipidemia   . Hypertension   . IBS (irritable bowel syndrome)   . Legally blind   . MVP (mitral valve prolapse)   . Paroxysmal atrial fibrillation with rapid ventricular response ([place]) 08/29/2017  . Peripheral vascular disease ([place])    . RAD (reactive airway disease)     Patient Active Problem List   Diagnosis Date Noted  . Chronic respiratory failure with hypoxia ([place]) 01/29/2020  . ILD (interstitial lung disease) ([place]) 01/29/2020  . Acute gout 09/21/2019  . Vertigo of central origin 02/09/2019  . Other peripheral vertigo, unspecified ear 02/09/2019  . Pruritus 01/02/2019  . Hypokalemia 08/09/2018  . Long term (current) use of anticoagulants 08/01/2018  . Closed compression fracture of L4 vertebra ([place]) 07/11/2018  . Bilateral leg edema 07/11/2018  . Lung nodule < 6cm on CT 07/11/2018  . Hypothyroidism (acquired) 04/11/2018  . Fracture of femoral neck, right ([place]) 03/22/2018  . PAF (paroxysmal atrial fibrillation) ([place]) 03/18/2018  . 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 Herpes zoster without complication 37/03/6268  . Temporal arteritis () 07/18/2017  . Vision, loss, sudden, bilateral 07/18/2017  . Osteoarthritis 06/25/2016  . Anxiety 06/25/2016  . Back pain 07/18/2015  . 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 Carotid bruit present 06/05/2010  . Osteoporosis 06/03/2008  . History of colonic polyps 06/03/2008  . INTENTION TREMOR 12/06/2007  . IBS 10/16/2007  . Hyperlipidemia 06/06/2007  . Essential hypertension 06/06/2007  . Chronic obstructive airway disease with asthma ([place]) 02/23/2007    Past Surgical History:  Procedure Laterality Date  . ABDOMINAL HYSTERECTOMY     with USO for dysfunctionall menses  . APPENDECTOMY    . ARTERY BIOPSY  Bilateral 07/21/2017   Procedure: BIOPSY BILATERAL TEMPORAL ARTERY;  Surgeon: [name], MD;  Location: [facility];  Service: Vascular;  Laterality: Bilateral;  . CARDIOVERSION N/A 06/16/2018   Procedure: CARDIOVERSION;  Surgeon: [name], MD;  Location: [facility];  Service: Cardiovascular;  Laterality: N/A;  . CATARACT EXTRACTION W/ INTRAOCULAR LENS IMPLANT  2013   bilateral; Dr [name]  . COLONOSCOPY  2013   negative , due 2018; Dr [name]    . COLONOSCOPY W/ POLYPECTOMY      X2 ; diverticulosis  . EMBOLECTOMY Right 08/29/2017   Procedure: Right Groin Exploration  ;  Surgeon: Rosetta Posner, MD;  Location: [facility];  Service: Vascular;  Laterality: Right;  . HIP PINNING,CANNULATED Right 03/21/2018  . HIP PINNING,CANNULATED Right 03/21/2018   Procedure: CANNULATED HIP PINNING;  Surgeon: Rod Can, MD;  Location: [employer];  Service: Orthopedics;  Laterality: Right;  . LUMBAR LAMINECTOMY  2000   Dr Vertell Limber  . SHOULDER SURGERY     R shoulder  . TONSILLECTOMY       OB History   No obstetric history on file.     Family History  Problem Relation Age of Onset  . Heart attack Brother 68  . Parkinsonism Mother   . Stroke Mother   . Heart attack Father        pre 54  . Diabetes Sister   . COPD Sister        X 3  . Other Son        suicide 2016  . Colon cancer Neg Hx   . Cancer Neg Hx   . Esophageal cancer Neg Hx   . Liver cancer Neg Hx   . Pancreatic cancer Neg Hx   . Rectal cancer Neg Hx   . Stomach cancer Neg Hx     Social History   Tobacco Use  . Smoking status: Former Smoker    Packs/day: 1.00    Years: 23.00    Pack years: 23.00    Quit date: 06/14/1989    Years since quitting: 30.6  . Smokeless tobacco: Never Used  . Tobacco comment: smoked 1961-1991 , up to < 1 ppd  Vaping Use  . Vaping Use: Never used  Substance Use Topics  . Alcohol use: Yes    Alcohol/week: 14.0 standard drinks    Types: 14 Glasses of wine per week    Comment: Wine  . Drug use: No    Home Medications Prior to Admission medications   Medication Sig Start Date End Date Taking? Authorizing Provider  albuterol (VENTOLIN HFA) 108 (90 Base) MCG/ACT inhaler INHALE ONE PUFF BY MOUTH TWICE A DAY AS NEEDED 10/01/19   Burns, Claudina Lick, MD  alendronate (FOSAMAX) 70 MG tablet TAKE 1 TABLET BY MOUTH ONCE WEEKLY ON AN EMPTY STOMACH BEFORE BREAKFAST. REMAIN UPRIGHT FOR 30 MINUTES & TAKE WITH 8 OUNCES OF WATER 10/31/19   [provider]   allopurinol (ZYLOPRIM) 100 MG tablet Take 1 tablet (100 mg total) by mouth daily. 02/04/20   [name], MD  amoxicillin (AMOXIL) 500 MG tablet Take 4 tablets by mouth as needed. Pt take 4 tablets prior dental work Patient not taking: Reported on 01/24/2020    [provider]  Calcium Citrate-Vitamin D (CALCIUM + D PO) Take 1 tablet by mouth daily.    [provider]  Cholecalciferol (VITAMIN D3) 1000 units CAPS Take 1 capsule (1,000 Units total) by mouth daily with lunch. 03/30/18  Angiulli, Lavon Paganini, PA-C  diclofenac Sodium (VOLTAREN) 1 % GEL Voltaren 1 % topical gel    [provider]  diltiazem (CARDIZEM CD) 180 MG 24 hr capsule Take 1 capsule (180 mg total) by mouth daily. 10/29/19 01/27/20  [name], MD  diltiazem (TIAZAC) 180 MG 24 hr capsule Take 180 mg by mouth daily.  01/13/20   [provider]  ELIQUIS 5 MG TABS tablet TAKE 1 TABLET BY MOUTH TWO TIMES A DAY Patient not taking: Reported on 01/24/2020 11/14/18   [name], MD  furosemide (LASIX) 40 MG tablet TAKE ONE TABLET BY MOUTH DAILY 02/04/20   [name], MD  gabapentin (NEURONTIN) 300 MG capsule Take 2 capsules (600 mg total) by mouth at bedtime. 09/21/19   [name], MD  HYDROcodone-acetaminophen (NORCO/VICODIN) 5-325 MG tablet hydrocodone 5 mg-acetaminophen 325 mg tablet  Take 1 tablet every 6 hours by oral route as needed.    [provider]  KLOR-CON M20 20 MEQ tablet TAKE TWO TABLETS BY MOUTH DAILY 02/04/20   [name], MD  levothyroxine (SYNTHROID) 75 MCG tablet Take 1 tablet (75 mcg total) by mouth daily. 01/16/20   [name], MD  LORazepam (ATIVAN) 1 MG tablet TAKE ONE TABLET BY MOUTH EVERY NIGHT AT BEDTIME 01/11/20   Burns, Claudina Lick, MD  methocarbamol (ROBAXIN) 500 MG tablet TAKE ONE TABLET BY MOUTH EVERY 6 HOURS AS NEEDED FOR MUSCLE SPASMS 12/10/19   Burns, Claudina Lick, MD  montelukast (SINGULAIR) 10 MG tablet TAKE ONE TABLET BY MOUTH EVERY NIGHT AT BEDTIME  12/24/19   [name], MD  predniSONE (DELTASONE) 10 MG tablet 3 tabs po qd x 3 days, then 2 tabs po qd x 3 days, then 1 tab po qd x 3 days 02/04/20   [name], MD  SYMBICORT 160-4.5 MCG/ACT inhaler INHALE 1 TO 2 PUFFS BY MOUTH EVERY 12 HOURS THEN GARGLE AND SPIT AFTER USE 01/21/20   [name], MD  theophylline (UNIPHYL) 400 MG 24 hr tablet TAKE 1/2 TABLET BY MOUTH TWO TIMES A DAY 01/14/20   [name], MD  traMADol (ULTRAM) 50 MG tablet Take 1 tablet (50 mg total) by mouth every 6 (six) hours as needed for moderate pain. 08/09/18   [name], MD    Allergies    Simvastatin  Review of Systems   Review of Systems  Constitutional: Negative for chills and fever.  Respiratory: Negative for shortness of breath.   Cardiovascular: Negative for chest pain.  Skin: Positive for wound.  Neurological: Positive for light-headedness. Negative for syncope.  All other systems reviewed and are negative.   Physical Exam Updated Vital Signs BP (!) 146/122 (BP Location: Left Arm)   Pulse (!) 103   Temp 98.4 F (36.9 C) (Oral)   Resp 14   Ht 5\' 2"  (1.575 m)   Wt 58.1 kg   SpO2 100%   BMI 23.41 kg/m   Physical Exam Vitals and nursing note reviewed.  Constitutional:      General: She is not in acute distress.    Appearance: She is well-developed.  HENT:     Head: Normocephalic and atraumatic.  Eyes:     General:        Right eye: No discharge.        Left eye: No discharge.     Conjunctiva/sclera: Conjunctivae normal.  Neck:     Vascular: No JVD.     Trachea: No  tracheal deviation.  Cardiovascular:     Rate and Rhythm: Normal rate and regular rhythm.     Pulses: Normal pulses.     Comments: 2+ DP/PT pulses bilaterally.  There is 2+ pitting edema of the bilateral lower extremities extending into the ankles. Pulmonary:     Effort: Pulmonary effort is normal.     Comments: SPO2 saturations down to 87% on room air with improvement on 1.5 L nasal cannula, speaking full  sentences without difficulty. Abdominal:     General: There is no distension.  Musculoskeletal:     Cervical back: Neck supple.     Comments: See below image.  Patient with an approximately 12cm laceration to the left lower leg, slow trickle of blood distally.  No crepitus noted.  Skin:    General: Skin is warm.     Findings: No erythema.  Neurological:     Mental Status: She is alert.     Comments: Sensation intact to light touch of bilateral lower extremities.  5/5 strength of BLE major muscle groups  Psychiatric:        Behavior: Behavior normal.       ED Results / Procedures / Treatments   Labs (all labs ordered are listed, but only abnormal results are displayed) Labs Reviewed  COMPREHENSIVE METABOLIC PANEL - Abnormal; Notable for the following components:      Result Value   Potassium 3.4 (*)    Calcium 8.8 (*)    Total Protein 5.5 (*)    Albumin 3.3 (*)    All other components within normal limits  CBC WITH DIFFERENTIAL/PLATELET - Abnormal; Notable for the following components:   WBC 16.5 (*)    RBC 3.68 (*)    Hemoglobin 11.5 (*)    MCV 104.3 (*)    MCHC 29.9 (*)    RDW 19.7 (*)    Platelets 487 (*)    nRBC 0.3 (*)    Neutro Abs 10.1 (*)    Monocytes Absolute 2.0 (*)    Basophils Absolute 0.2 (*)    Abs Immature Granulocytes 0.56 (*)    All other components within normal limits    EKG None  Radiology No results found.  Procedures .Marland KitchenLaceration Repair  Date/Time: 02/06/2020 3:00 PM Performed by: Renita Papa, PA-C Authorized by: Renita Papa, PA-C   Consent:    Consent obtained:  Verbal   Consent given by:  Patient   Risks discussed:  Infection, need for additional repair, pain, poor cosmetic result and poor wound healing   Alternatives discussed:  No treatment and delayed treatment Universal protocol:    Procedure explained and questions answered to patient or proxy's satisfaction: yes     Relevant documents present and verified: yes     Test  results available and properly labeled: yes     Imaging studies available: yes     Required blood products, implants, devices, and special equipment available: yes     Site/side marked: yes     Immediately prior to procedure, a time out was called: yes     Patient identity confirmed:  Verbally with patient Anesthesia (see MAR for exact dosages):    Anesthesia method:  Local infiltration Repair type:    Repair type:  Intermediate Exploration:    Hemostasis achieved with:  Direct pressure   Wound exploration: wound explored through full range of motion     Wound extent: areolar tissue violated     Wound extent: no underlying fracture noted  Treatment:    Area cleansed with:  Saline   Amount of cleaning:  Extensive   Irrigation solution:  Sterile water   Irrigation method:  Pressure wash Skin repair:    Repair method:  Steri-Strips   Number of Steri-Strips:  15 (5 dermaclips, 10 steristrips) Approximation:    Approximation:  Close Post-procedure details:    Dressing:  Sterile dressing   Patient tolerance of procedure:  Tolerated well, no immediate complications   (including critical care time)  Medications Ordered in ED Medications - No data to display  ED Course  I have reviewed the triage vital signs and the nursing notes.  Pertinent labs & imaging results that were available during my care of the patient were reviewed by me and considered in my medical decision making (see chart for details).    MDM Rules/Calculators/A&P                          Patient presenting for evaluation of left lower extremity wound sustained around 5 PM yesterday.  She is afebrile, initially O2 saturations low at 87% on room air but she does intermittently require supplemental oxygen at home which she has readily available to her.  She is neurovascularly intact.  Doubt underlying fracture.  Her tetanus is up-to-date.  She did state that she felt a little lightheaded with standing this morning but  this improved with drinking p.o. fluids in the ED.  Lab work reviewed and interpreted by myself shows leukocytosis but she states that she is currently on steroids for her IBS which could contribute to her leukocytosis.  She is anemic today with a hemoglobin of 11.5.  It looks like this is close to her baseline as she is typically between 11-13.  She was higher 3 weeks ago though it is difficult to say that it is all secondary to her wound versus her chronic illnesses.  She has been hemodynamically stable while in the ED.  No metabolic derangements or renal insufficiency.  Wound was extensively irrigated and base of the wound was visualized in a bloodless field.  Bleeding was controlled.  Wound was closed with derma clips and Steri-Strips and a light pressure dressing was applied.  Discussed wound care at home.  Patient states she is feeling better and would like to go home.  Recommend close follow-up with PCP for reevaluation of symptoms and for wound recheck.  Discussed strict ED return precautions.  Patient verbalized understanding of and agreement with plan and patient is stable for discharge at this time.  Patient was seen and evaluated by Dr. Ralene Bathe who agrees with assessment and plan at this time   Final Clinical Impression(s) / ED Diagnoses Final diagnoses:  Laceration of left lower leg, initial encounter  Intermittent lightheadedness  Anemia, unspecified type  Renal insufficiency    Rx / DC Orders ED Discharge Orders    None       Debroah Baller 02/06/20 1511    Quintella Reichert, MD 02/07/20 [phone]

## 2020-02-06 NOTE — Discharge Instructions (Addendum)
Your wound today was cleaned and closed with dermaclips and Steri-Strips.  These should fall off on their own in about 5-7 days or so.  Keep the area clean and dry.  Change the dressings twice daily.  You can apply a nonadherent gauze and then wrapp with Kerlix or tube gauze.  Pat the area dry, do not rub it.  Be gentle with the area.  Keep the area elevated when you are not walking to help reduce swelling. Drink plenty of water and get rest.    Follow-up with your PCP within the next 3 to 5 days for reevaluation and for wound recheck.  May be reasonable for your PCP to recheck your CBC to make sure that your anemia is stable.  Return to the emergency department if any concerning signs or symptoms develop such as fevers, loss of consciousness, weakness, abnormal drainage, severe swelling or redness of the skin.

## 2020-02-06 NOTE — ED Triage Notes (Signed)
Pt arrives via [facility] from home with c/o laceration to L lower leg after cutting her leg on wheelchair last night, ems came out and bandaged leg, however, this morning, she awoke and noticed leg had been apparently bleeding heavily all night. Pt recently stopped her eliquis 3 weeks ago. Pt did have some n/v/ and lightheadedness. Pt is legally blind. A/ox4.

## 2020-02-07 ENCOUNTER — Telehealth: Payer: Self-pay | Admitting: Internal Medicine

## 2020-02-07 MED ORDER — DIPHENOXYLATE-ATROPINE 2.5-0.025 MG PO TABS: 1.0000 | ORAL_TABLET | Freq: Four times a day (QID) | ORAL | 0 refills | Status: DC | PRN

## 2020-02-07 NOTE — Telephone Encounter (Signed)
   Spouse calling on behalf of patient to report diarrhea x3 days, stating has IBS.  Currently taking Imodium ,patient states it is not helping, Requesting medication be prescribed

## 2020-02-07 NOTE — Addendum Note (Signed)
Addended by: [name] on: 02/07/2020 12:08 PM   Modules accepted: Orders

## 2020-02-07 NOTE — Telephone Encounter (Signed)
w can try lomotil - a stronger anti-diarrheal medication .   -- sent to [facility]

## 2020-02-07 NOTE — Telephone Encounter (Signed)
Pt's husband has been notified

## 2020-02-12 NOTE — Telephone Encounter (Signed)
Error

## 2020-02-13 ENCOUNTER — Encounter: Payer: Self-pay | Admitting: Internal Medicine

## 2020-02-13 ENCOUNTER — Ambulatory Visit (INDEPENDENT_AMBULATORY_CARE_PROVIDER_SITE_OTHER): Payer: Medicare Other | Admitting: Internal Medicine

## 2020-02-13 ENCOUNTER — Other Ambulatory Visit: Payer: Self-pay

## 2020-02-13 DIAGNOSIS — S81812D Laceration without foreign body, left lower leg, subsequent encounter: Secondary | ICD-10-CM

## 2020-02-13 DIAGNOSIS — S81812A Laceration without foreign body, left lower leg, initial encounter: Secondary | ICD-10-CM | POA: Insufficient documentation

## 2020-02-13 NOTE — Patient Instructions (Signed)
Continue with wound care

## 2020-02-13 NOTE — Assessment & Plan Note (Signed)
Acute Was seen in the emergency room-Steri-Strips and dermal clips placed Wound healing well without signs of infection Change bandage Advised on wound care We will let Steri-Strips and Indermil clips stand for another few days Okay to shower and let water run over wound, but no scrubbing or washing No need for antibiotic They will call with any concerns

## 2020-02-13 NOTE — Progress Notes (Signed)
Subjective:    Patient ID: Crystal [name], female    DOB: 1945/08/29, 74 y.o.   MRN: [id number]  HPI The patient is here for follow up from the ED.  ED 8/25 for left leg laceration.  That day she struck her leg against her walker and cut her leg. EMS came out twice to stop the bleeding.  She had some lightheaded and weakness that day.   Steri strips and dermaclips placed on leg.    She denies any pain and would like more soreness.  She has not had any fevers or chills.  Her husband has changed her bandage.  He states the bottom 2 Steri-Strips did partially come off and there was a little bit of bleeding, but no bleeding since then.  There is no discharge.  They have not gotten the area wet.     Medications and allergies reviewed with patient and updated if appropriate.  Patient Active Problem List   Diagnosis Date Noted  . Chronic respiratory failure with hypoxia ([employer]) 01/29/2020  . ILD (interstitial lung disease) ([place]) 01/29/2020  . Acute gout 09/21/2019  . Vertigo of central origin 02/09/2019  . Other peripheral vertigo, unspecified ear 02/09/2019  . Pruritus 01/02/2019  . Hypokalemia 08/09/2018  . Long term (current) use of anticoagulants 08/01/2018  . Closed compression fracture of L4 vertebra ([place]) 07/11/2018  . Bilateral leg edema 07/11/2018  . Lung nodule < 6cm on CT 07/11/2018  . Hypothyroidism (acquired) 04/11/2018  . Fracture of femoral neck, right ([place]) 03/22/2018  . PAF (paroxysmal atrial fibrillation) ([place]) 03/18/2018  . Femoral artery thrombosis, right ([place]) 09/01/2017  . Herpes zoster without complication 48/18/5631  . Temporal arteritis ([place]) 07/18/2017  . Vision, loss, sudden, bilateral 07/18/2017  . Osteoarthritis 06/25/2016  . Anxiety 06/25/2016  . Back pain 07/18/2015  . 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 Carotid bruit present 06/05/2010  . Osteoporosis 06/03/2008  . History of colonic polyps 06/03/2008  . INTENTION TREMOR 12/06/2007  . IBS 10/16/2007  .  Hyperlipidemia 06/06/2007  . Essential hypertension 06/06/2007  . Chronic obstructive airway disease with asthma ([place]) 02/23/2007    Current Outpatient Medications on File Prior to Visit  Medication Sig Dispense Refill  . albuterol (VENTOLIN HFA) 108 (90 Base) MCG/ACT inhaler INHALE ONE PUFF BY MOUTH TWICE A DAY AS NEEDED 8.5 g 1  . alendronate (FOSAMAX) 70 MG tablet TAKE 1 TABLET BY MOUTH ONCE WEEKLY ON AN EMPTY STOMACH BEFORE BREAKFAST. REMAIN UPRIGHT FOR 30 MINUTES & TAKE WITH 8 OUNCES OF WATER    . allopurinol (ZYLOPRIM) 100 MG tablet Take 1 tablet (100 mg total) by mouth daily. 30 tablet 6  . Calcium Citrate-Vitamin D (CALCIUM + D PO) Take 1 tablet by mouth daily.    . Cholecalciferol (VITAMIN D3) 1000 units CAPS Take 1 capsule (1,000 Units total) by mouth daily with lunch. 60 capsule 0  . diclofenac Sodium (VOLTAREN) 1 % GEL Voltaren 1 % topical gel    . diltiazem (TIAZAC) 180 MG 24 hr capsule Take 180 mg by mouth daily.     . diphenoxylate-atropine (LOMOTIL) 2.5-0.025 MG tablet Take 1 tablet by mouth 4 (four) times daily as needed for diarrhea or loose stools. 30 tablet 0  . furosemide (LASIX) 40 MG tablet TAKE ONE TABLET BY MOUTH DAILY 90 tablet 0  . gabapentin (NEURONTIN) 300 MG capsule Take 2 capsules (600 mg total) by mouth at bedtime. 180 capsule 1  . HYDROcodone-acetaminophen (NORCO/VICODIN) 5-325 MG tablet hydrocodone 5 mg-acetaminophen 325  mg tablet  Take 1 tablet every 6 hours by oral route as needed.    Marland Kitchen KLOR-CON M20 20 MEQ tablet TAKE TWO TABLETS BY MOUTH DAILY 180 tablet 0  . levothyroxine (SYNTHROID) 75 MCG tablet Take 1 tablet (75 mcg total) by mouth daily. 90 tablet 3  . LORazepam (ATIVAN) 1 MG tablet TAKE ONE TABLET BY MOUTH EVERY NIGHT AT BEDTIME 30 tablet 3  . methocarbamol (ROBAXIN) 500 MG tablet TAKE ONE TABLET BY MOUTH EVERY 6 HOURS AS NEEDED FOR MUSCLE SPASMS 120 tablet 2  . montelukast (SINGULAIR) 10 MG tablet TAKE ONE TABLET BY MOUTH EVERY NIGHT AT BEDTIME 90  tablet 0  . predniSONE (DELTASONE) 10 MG tablet 3 tabs po qd x 3 days, then 2 tabs po qd x 3 days, then 1 tab po qd x 3 days 18 tablet 0  . SYMBICORT 160-4.5 MCG/ACT inhaler INHALE 1 TO 2 PUFFS BY MOUTH EVERY 12 HOURS THEN GARGLE AND SPIT AFTER USE 10.2 g 9  . theophylline (UNIPHYL) 400 MG 24 hr tablet TAKE 1/2 TABLET BY MOUTH TWO TIMES A DAY 90 tablet 1  . traMADol (ULTRAM) 50 MG tablet Take 1 tablet (50 mg total) by mouth every 6 (six) hours as needed for moderate pain. 90 tablet 0  . amoxicillin (AMOXIL) 500 MG tablet Take 4 tablets by mouth as needed. Pt take 4 tablets prior dental work (Patient not taking: Reported on 01/24/2020)    . diltiazem (CARDIZEM CD) 180 MG 24 hr capsule Take 1 capsule (180 mg total) by mouth daily. 90 capsule 3   No current facility-administered medications on file prior to visit.    Past Medical History:  Diagnosis Date  . Adenomatous colon polyp 2007 & 2010    Dr [name]  . Allergy    seasonal  . Arthritis   . Asthma   . Cataract   . Diverticulosis   . 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 Hyperlipidemia   . Hypertension   . IBS (irritable bowel syndrome)   . Legally blind   . MVP (mitral valve prolapse)   . Paroxysmal atrial fibrillation with rapid ventricular response ([place]) 08/29/2017  . Peripheral vascular disease ([place])   . RAD (reactive airway disease)     Past Surgical History:  Procedure Laterality Date  . ABDOMINAL HYSTERECTOMY     with USO for dysfunctionall menses  . APPENDECTOMY    . ARTERY BIOPSY Bilateral 07/21/2017   Procedure: BIOPSY BILATERAL TEMPORAL ARTERY;  Surgeon: [name], MD;  Location: [facility];  Service: Vascular;  Laterality: Bilateral;  . CARDIOVERSION N/A 06/16/2018   Procedure: CARDIOVERSION;  Surgeon: [name], MD;  Location: [facility];  Service: Cardiovascular;  Laterality: N/A;  . CATARACT EXTRACTION W/ INTRAOCULAR LENS IMPLANT  2013   bilateral; Dr [name]  . COLONOSCOPY  2013    negative , due 2018; Dr [name]  . COLONOSCOPY W/ POLYPECTOMY      X2 ; diverticulosis  . EMBOLECTOMY Right 08/29/2017   Procedure: Right Groin Exploration  ;  Surgeon: Rosetta Posner, MD;  Location: [facility];  Service: Vascular;  Laterality: Right;  . HIP PINNING,CANNULATED Right 03/21/2018  . HIP PINNING,CANNULATED Right 03/21/2018   Procedure: CANNULATED HIP PINNING;  Surgeon: Rod Can, MD;  Location: [facility];  Service: Orthopedics;  Laterality: Right;  . LUMBAR LAMINECTOMY  2000   Dr Vertell Limber  . SHOULDER SURGERY     R shoulder  . TONSILLECTOMY  Social History   Socioeconomic History  . Marital status: Married    Spouse name: Not on file  . Number of children: Not on file  . Years of education: Not on file  . Highest education level: Not on file  Occupational History  . Occupation: retired  Tobacco Use  . Smoking status: Former Smoker    Packs/day: 1.00    Years: 23.00    Pack years: 23.00    Quit date: 06/14/1989    Years since quitting: 30.6  . Smokeless tobacco: Never Used  . Tobacco comment: smoked 1961-1991 , up to < 1 ppd  Vaping Use  . Vaping Use: Never used  Substance and Sexual Activity  . Alcohol use: Yes    Alcohol/week: 14.0 standard drinks    Types: 14 Glasses of wine per week    Comment: Wine  . Drug use: No  . Sexual activity: Not on file  Other Topics Concern  . Not on file  Social History Narrative  . Not on file   Social Determinants of Health   Financial Resource Strain: Low Risk   . Difficulty of Paying Living Expenses: Not hard at all  Food Insecurity:   . Worried About Charity fundraiser in the Last Year: Not on file  . Ran Out of Food in the Last Year: Not on file  Transportation Needs:   . Lack of Transportation (Medical): Not on file  . Lack of Transportation (Non-Medical): Not on file  Physical Activity:   . Days of Exercise per Week: Not on file  . Minutes of Exercise per Session: Not on file  Stress:   . Feeling of Stress : Not  on file  Social Connections:   . Frequency of Communication with Friends and Family: Not on file  . Frequency of Social Gatherings with Friends and Family: Not on file  . Attends Religious Services: Not on file  . Active Member of Clubs or Organizations: Not on file  . Attends Archivist Meetings: Not on file  . Marital Status: Not on file    Family History  Problem Relation Age of Onset  . Heart attack Brother 88  . Parkinsonism Mother   . Stroke Mother   . Heart attack Father        pre 10  . Diabetes Sister   . COPD Sister        X 3  . Other Son        suicide 2016  . Colon cancer Neg Hx   . Cancer Neg Hx   . Esophageal cancer Neg Hx   . Liver cancer Neg Hx   . Pancreatic cancer Neg Hx   . Rectal cancer Neg Hx   . Stomach cancer Neg Hx     Review of Systems  Constitutional: Negative for chills and fever.  Cardiovascular: Positive for palpitations (occ) and leg swelling.       Objective:   Vitals:   02/13/20 1134 02/13/20 1241  BP: (!) 142/62   Pulse: (!) 140 85  Temp: 98.9 F (37.2 C)   SpO2: 94%    BP Readings from Last 3 Encounters:  02/13/20 (!) 142/62  02/06/20 (!) 146/122  01/24/20 132/64   Wt Readings from Last 3 Encounters:  02/06/20 128 lb (58.1 kg)  01/24/20 130 lb 3.2 oz (59.1 kg)  01/06/20 136 lb 11 oz (62 kg)   There is no height or weight on file to calculate BMI.  Physical Exam Constitutional:      General: She is not in acute distress.    Appearance: Normal appearance. She is not ill-appearing.  Skin:    General: Skin is warm and dry.     Comments: Left lower leg anterior aspect-laceration with tendon Steri-Strips all still in place in 5 dermal clips Wound is healing well.  No active bleeding discharge.  Skin surrounding laceration without erythema or abnormal swelling.  No concerning fluctuance or tenderness  Neurological:     Mental Status: She is alert.            Assessment & Plan:    See Problem List  for Assessment and Plan of chronic medical problems.    This visit occurred during the SARS-CoV-2 public health emergency.  Safety protocols were in place, including screening questions prior to the visit, additional usage of staff PPE, and extensive cleaning of exam room while observing appropriate contact time as indicated for disinfecting solutions.

## 2020-02-17 ENCOUNTER — Other Ambulatory Visit: Payer: Self-pay | Admitting: Internal Medicine

## 2020-02-25 DIAGNOSIS — J449 Chronic obstructive pulmonary disease, unspecified: Secondary | ICD-10-CM | POA: Diagnosis not present

## 2020-02-25 DIAGNOSIS — S72001A Fracture of unspecified part of neck of right femur, initial encounter for closed fracture: Secondary | ICD-10-CM | POA: Diagnosis not present

## 2020-02-25 DIAGNOSIS — S72001D Fracture of unspecified part of neck of right femur, subsequent encounter for closed fracture with routine healing: Secondary | ICD-10-CM | POA: Diagnosis not present

## 2020-02-26 ENCOUNTER — Other Ambulatory Visit: Payer: Self-pay | Admitting: Family

## 2020-02-28 IMAGING — MR MR HEAD WO/W CM
12 series · 48 of 48 positions shown · IV contrast (11ml Multihance)
Comparison: Head CT 02/01/2019 and MRI 07/18/2017

CLINICAL DATA: Headaches, dizziness, imbalance with vertigo, and
frequent falls. Symptoms for a few weeks.

EXAM:
MRI HEAD WITHOUT AND WITH CONTRAST
TECHNIQUE: Multiplanar, multiecho pulse sequences of the brain and surrounding
structures were obtained without and with intravenous contrast.
CONTRAST:  11mL MULTIHANCE GADOBENATE DIMEGLUMINE 529 MG/ML IV SOLN

[Series 2: T1 · sagittal · 5.0mm · 0.45mm/px · 3 of 24 slices shown]
[im 1/24]
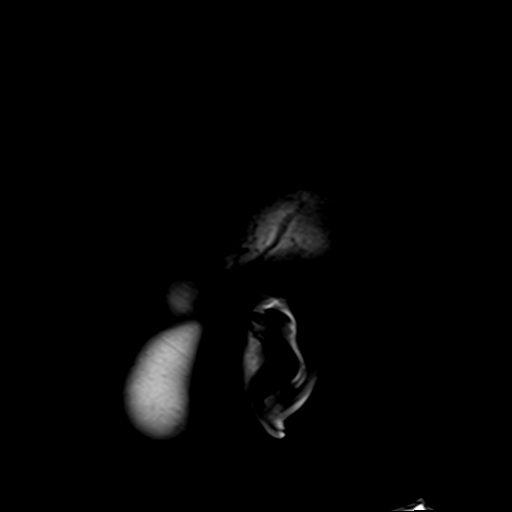
[im 12/24]
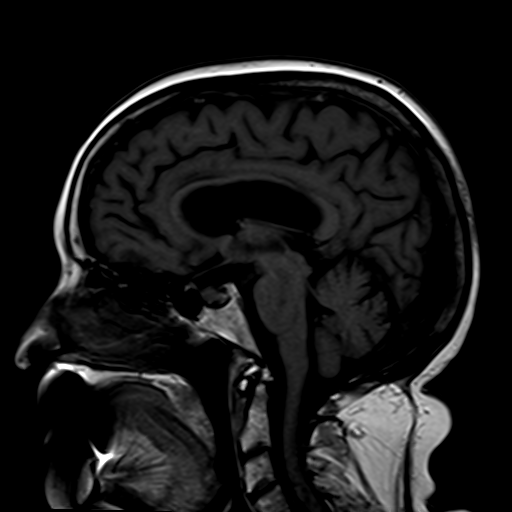
[im 24/24]
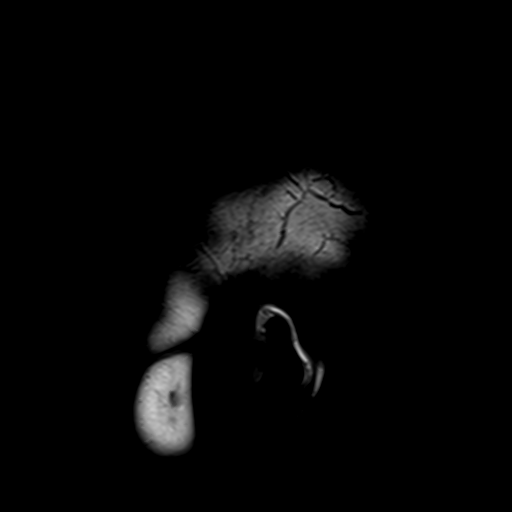

[Series 3: DWI · axial · 3.0mm · 1.80mm/px · z∈[-60,+82]mm · 6 of 98 slices shown (1 of 4)]
[im 1/98]
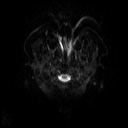
[im 20/98]
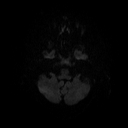
[im 39/98]
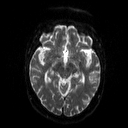
[im 59/98]
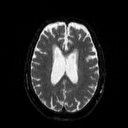
[im 78/98]
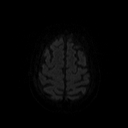
[im 98/98]
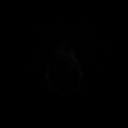

[Series 4: DWI · axial · 3.0mm · 1.80mm/px · z∈[-60,+82]mm · 3 of 49 slices shown (2 of 4)]
[im 1/49]
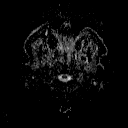
[im 25/49]
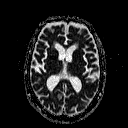
[im 49/49]
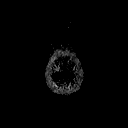

[Series 5: FLAIR · axial · 3.0mm · 0.45mm/px · z∈[-60,+82]mm · 2 of 32 slices shown]
[im 1/32]
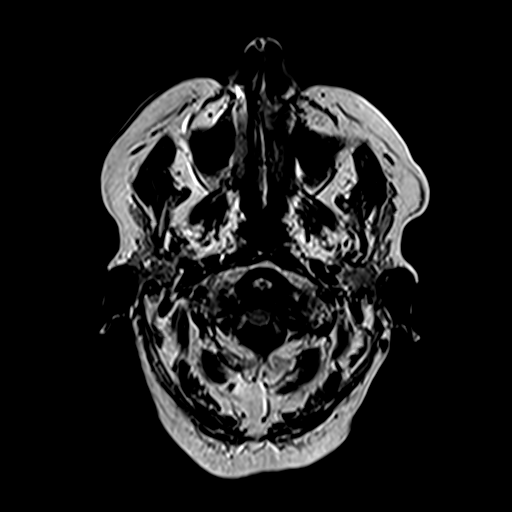
[im 32/32]
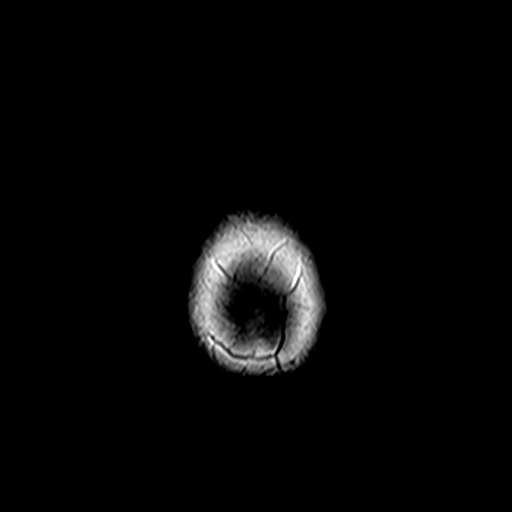

[Series 6: T2 · axial · 5.0mm · 0.60mm/px · 1 of 22 slices shown (1 of 2)]
[im 1/22]
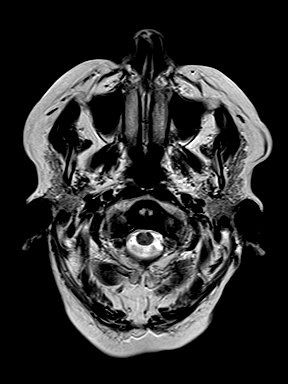

[Series 8: swi_images · axial · 5.0mm · 0.90mm/px · z∈[-60,+92]mm · 2 of 32 slices shown]
[im 1/32]
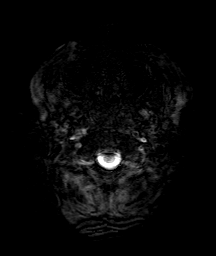
[im 32/32]
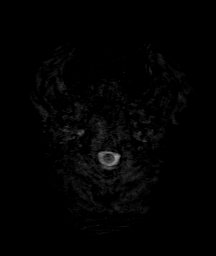

[Series 9: t1_mpr_tra · axial · 1.0mm · 0.72mm/px · z∈[-63,+93]mm · 10 of 160 slices shown (1 of 2)]
[im 1/160]
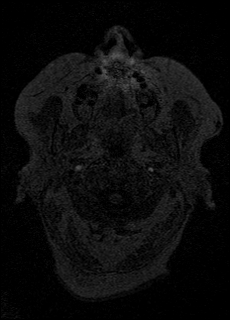
[im 18/160]
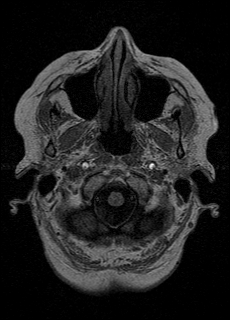
[im 36/160]
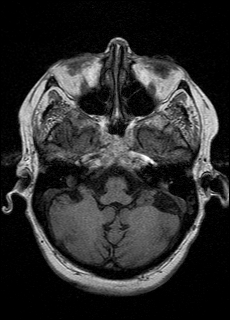
[im 54/160]
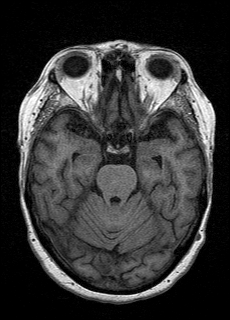
[im 71/160]
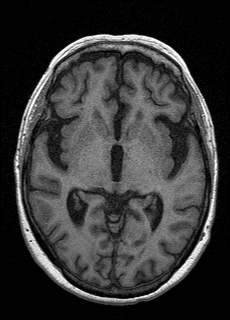
[im 89/160]
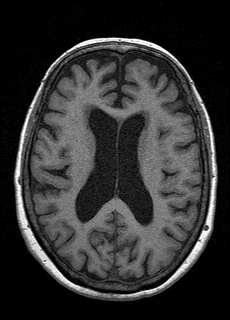
[im 107/160]
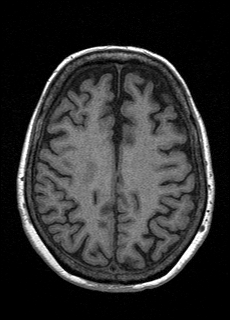
[im 124/160]
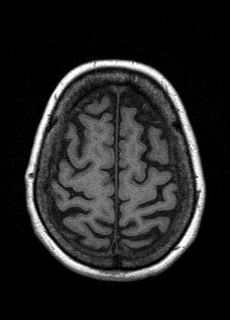
[im 142/160]
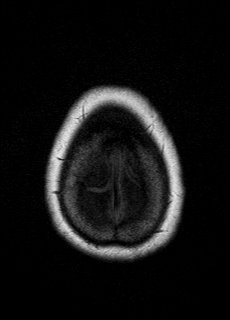
[im 160/160]
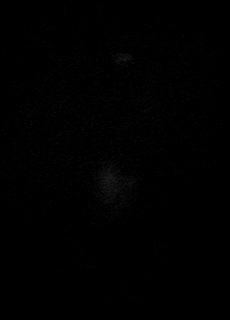

[Series 10: DWI · coronal · 5.0mm · 1.80mm/px · 5 of 73 slices shown (3 of 4)]
[im 1/73]
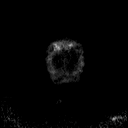
[im 19/73]
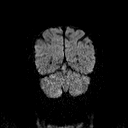
[im 37/73]
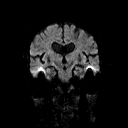
[im 55/73]
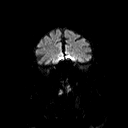
[im 73/73]
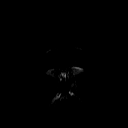

[Series 11: DWI · coronal · 5.0mm · 1.80mm/px · 2 of 36 slices shown (4 of 4)]
[im 1/36]
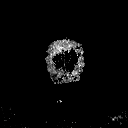
[im 36/36]
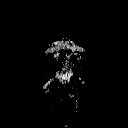

[Series 12: T2 · coronal · 5.0mm · 0.45mm/px · 2 of 29 slices shown (2 of 2)]
[im 1/29]
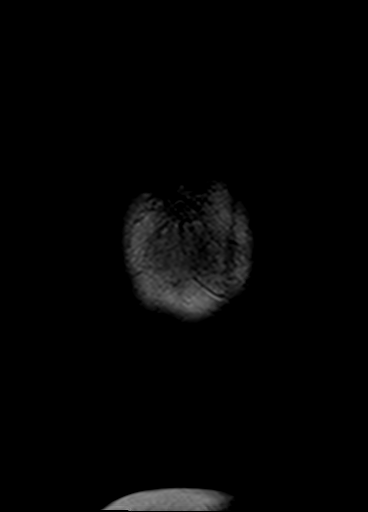
[im 29/29]
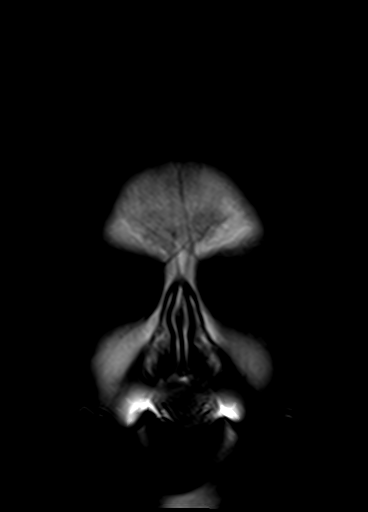

[Series 13: t1_mpr_tra · axial · 1.0mm · 0.72mm/px · z∈[-63,+93]mm · 10 of 160 slices shown (2 of 2)]
[im 1/160]
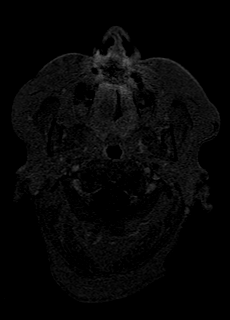
[im 18/160]
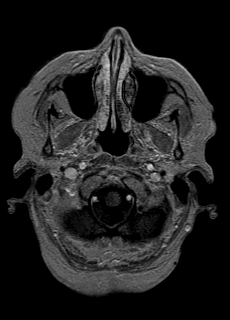
[im 36/160]
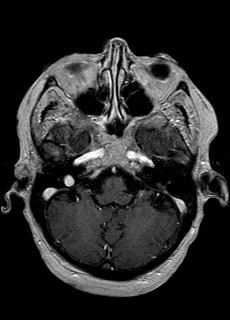
[im 54/160]
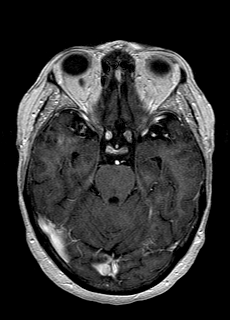
[im 71/160]
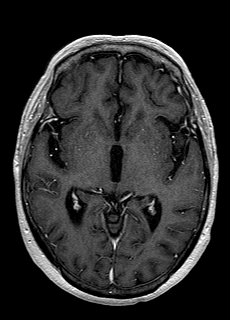
[im 89/160]
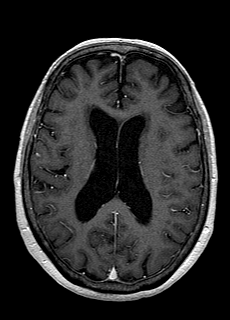
[im 107/160]
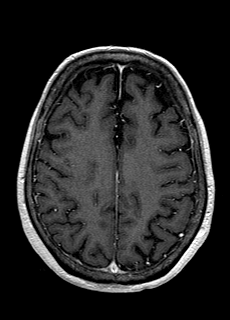
[im 124/160]
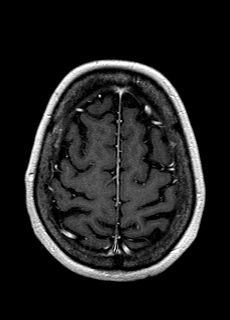
[im 142/160]
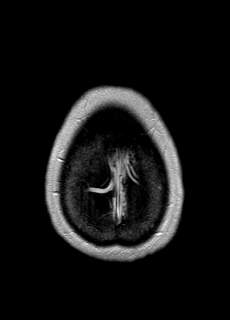
[im 160/160]
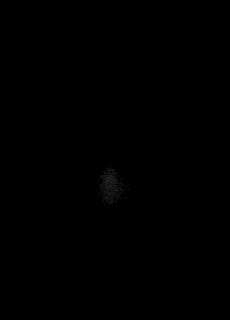

[Series 14: post cor · coronal · 5.0mm · 0.45mm/px · 2 of 29 slices shown]
[im 1/29]
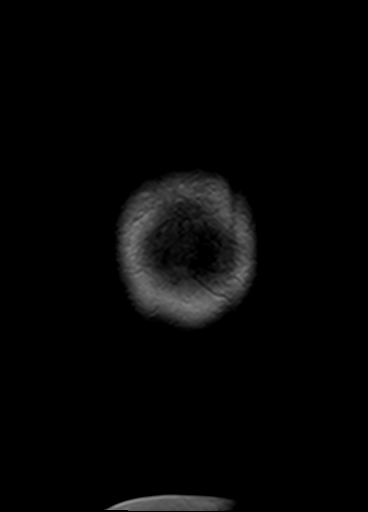
[im 29/29]
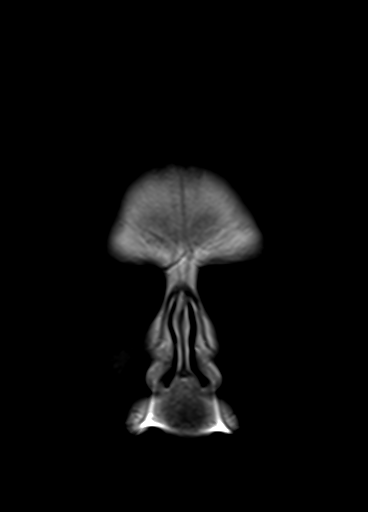

[48 of 48 positions shown; findings below may reference images not displayed]

FINDINGS: Brain: There is no evidence of acute infarct, intracranial
hemorrhage, mass, midline shift, or extra-axial fluid collection.
Small foci of T2 hyperintensity scattered in the cerebral white
matter bilaterally have mildly progressed from the prior MRI. Mild
cerebral atrophy also appears mildly progressive from the prior MRI.

Vascular: Major intracranial vascular flow voids are preserved.

Skull and upper cervical spine: No suspicious marrow lesion. Severe
facet arthrosis at C3-4 and C4-5.

Sinuses/Orbits: Bilateral cataract extraction. Minimal mucosal
thickening in the ethmoid and sphenoid sinuses. Trace bilateral
mastoid effusions.

Other: None.
IMPRESSION: 1. No acute intracranial abnormality.
2. Since [DATE], mild progression of cerebral atrophy and mild
cerebral white matter T2 signal changes which are nonspecific though
most often seen with chronic small vessel ischemia.

## 2020-03-03 ENCOUNTER — Other Ambulatory Visit: Payer: Self-pay | Admitting: Internal Medicine

## 2020-03-12 ENCOUNTER — Telehealth: Payer: Self-pay | Admitting: Internal Medicine

## 2020-03-12 ENCOUNTER — Other Ambulatory Visit: Payer: Self-pay | Admitting: Family

## 2020-03-12 MED ORDER — PREDNISONE 10 MG PO TABS
ORAL_TABLET | ORAL | 0 refills | Status: DC
Start: 2020-03-12 — End: 2020-04-09

## 2020-03-12 MED ORDER — ALLOPURINOL 100 MG PO TABS: 200.0000 mg | ORAL_TABLET | Freq: Every day | ORAL | 1 refills | Status: DC

## 2020-03-12 NOTE — Telephone Encounter (Signed)
Patient's husband called, gout meds not working, prefers prednisone as previously administered to get rid of gout  Crystal [name] [address] [place] - [address] Phone:  [phone]  Fax:  [phone]     Last appt: 9.1.21 Next appt: 2.2.22

## 2020-03-12 NOTE — Telephone Encounter (Signed)
Notified pt husband w/MD response../lmb 

## 2020-03-12 NOTE — Telephone Encounter (Signed)
Prednisone taper sent to pharmacy.  She should continue the allopurinol daily-lets increase the dose to 200 mg daily.  This is for prevention only.  The goal is to reduce these events and reduce the amount that she needs the prednisone because of the side effects with taking prednisone too much.  New prescription sent for allopurinol so she can increase the dose to 2 pills daily.

## 2020-03-22 ENCOUNTER — Other Ambulatory Visit: Payer: Self-pay | Admitting: Internal Medicine

## 2020-03-26 DIAGNOSIS — S72001D Fracture of unspecified part of neck of right femur, subsequent encounter for closed fracture with routine healing: Secondary | ICD-10-CM | POA: Diagnosis not present

## 2020-03-26 DIAGNOSIS — S72001A Fracture of unspecified part of neck of right femur, initial encounter for closed fracture: Secondary | ICD-10-CM | POA: Diagnosis not present

## 2020-03-26 DIAGNOSIS — J449 Chronic obstructive pulmonary disease, unspecified: Secondary | ICD-10-CM | POA: Diagnosis not present

## 2020-03-28 ENCOUNTER — Other Ambulatory Visit: Payer: Self-pay

## 2020-03-28 ENCOUNTER — Ambulatory Visit
Admission: RE | Admit: 2020-03-28 | Discharge: 2020-03-28 | Disposition: A | Discharge status: Home / Self Care | Payer: Medicare Other | Source: Ambulatory Visit | Attending: Primary Care | Admitting: Primary Care

## 2020-03-28 DIAGNOSIS — J849 Interstitial pulmonary disease, unspecified: Secondary | ICD-10-CM

## 2020-03-28 DIAGNOSIS — J84112 Idiopathic pulmonary fibrosis: Secondary | ICD-10-CM | POA: Diagnosis not present

## 2020-03-28 DIAGNOSIS — R0609 Other forms of dyspnea: Secondary | ICD-10-CM | POA: Diagnosis not present

## 2020-03-28 DIAGNOSIS — I2510 Atherosclerotic heart disease of native coronary artery without angina pectoris: Secondary | ICD-10-CM | POA: Diagnosis not present

## 2020-03-28 DIAGNOSIS — I700 Atherosclerosis of aorta: Secondary | ICD-10-CM | POA: Diagnosis not present

## 2020-04-02 ENCOUNTER — Telehealth: Payer: Self-pay | Admitting: Internal Medicine

## 2020-04-02 NOTE — Progress Notes (Signed)
Please let patient know CT chest showed findings consistent with mild pulmonary fibrosis, no change/progression since prior imaging in February. She has moderate emphysema of the lungs and coronary artery disease. She should discuss atherosclerosis with internal medicine at her next visit. For this recommend low saturated fat diet, exercise and annual cholesterol panel.

## 2020-04-02 NOTE — Telephone Encounter (Signed)
I have sent results to [facility]

## 2020-04-02 NOTE — Telephone Encounter (Signed)
Crystal Ehrich, NP sent to Crystal [name], Crystal [name], CMA Please let patient know CT chest showed findings consistent with mild pulmonary fibrosis, no change/progression since prior imaging in February. She has moderate emphysema of the lungs and coronary artery disease. She should discuss atherosclerosis with internal medicine at her next visit. For this recommend low saturated fat diet, exercise and annual cholesterol panel.     Crystal Ehrich, NP sent to Crystal [name], Crystal [name], CMA Also she had a stable 53mm pulmonary noduke right lung which has not changed in size   Spoke with the pt and her spouse and notified of results/recs per [facility]. They verbalized understanding. Copy of CT to her PCP.

## 2020-04-02 NOTE — Progress Notes (Signed)
Pt notified of results/see phone note 04/02/20

## 2020-04-02 NOTE — Telephone Encounter (Signed)
Spoke with the pt's spouse  He is asking about CT Chest results from 03/28/20 Looks like Beth ordered this test, so will send to her to advise on results  Please advise, thanks!

## 2020-04-02 NOTE — Progress Notes (Signed)
Pt made aware of results- see phone note 04/02/20

## 2020-04-02 NOTE — Progress Notes (Signed)
Also she had a stable 71mm pulmonary noduke right lung which has not changed in size

## 2020-04-06 NOTE — Progress Notes (Deleted)
Subjective:    Patient ID: Crystal [name], female    DOB: Jan 21, 1946, 74 y.o.   MRN: [id number]  HPI The patient is here for an acute visit.   Gout -     Medications and allergies reviewed with patient and updated if appropriate.  Patient Active Problem List   Diagnosis Date Noted  . Leg laceration, left, subsequent encounter 02/13/2020  . Chronic respiratory failure with hypoxia ([place]) 01/29/2020  . ILD (interstitial lung disease) ([place]) 01/29/2020  . Acute gout 09/21/2019  . Vertigo of central origin 02/09/2019  . Other peripheral vertigo, unspecified ear 02/09/2019  . Pruritus 01/02/2019  . Hypokalemia 08/09/2018  . Long term (current) use of anticoagulants 08/01/2018  . Closed compression fracture of L4 vertebra ([place]) 07/11/2018  . Bilateral leg edema 07/11/2018  . Lung nodule < 6cm on CT 07/11/2018  . Hypothyroidism (acquired) 04/11/2018  . Fracture of femoral neck, right ([place]) 03/22/2018  . PAF (paroxysmal atrial fibrillation) ([place]) 03/18/2018  . Femoral artery thrombosis, right ([place]) 09/01/2017  . Herpes zoster without complication 93/79/0240  . Temporal arteritis ([place]) 07/18/2017  . Vision, loss, sudden, bilateral 07/18/2017  . Osteoarthritis 06/25/2016  . Anxiety 06/25/2016  . Back pain 07/18/2015  . 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 Carotid bruit present 06/05/2010  . Osteoporosis 06/03/2008  . History of colonic polyps 06/03/2008  . INTENTION TREMOR 12/06/2007  . IBS 10/16/2007  . Hyperlipidemia 06/06/2007  . Essential hypertension 06/06/2007  . Chronic obstructive airway disease with asthma ([place]) 02/23/2007    Current Outpatient Medications on File Prior to Visit  Medication Sig Dispense Refill  . albuterol (VENTOLIN HFA) 108 (90 Base) MCG/ACT inhaler INHALE ONE PUFF BY MOUTH TWICE A DAY AS NEEDED 8.5 g 1  . alendronate (FOSAMAX) 70 MG tablet TAKE 1 TABLET BY MOUTH ONCE WEEKLY ON AN EMPTY STOMACH BEFORE BREAKFAST. REMAIN UPRIGHT FOR 30 MINUTES & TAKE WITH 8  OUNCES OF WATER    . allopurinol (ZYLOPRIM) 100 MG tablet Take 2 tablets (200 mg total) by mouth daily. 180 tablet 1  . amoxicillin (AMOXIL) 500 MG tablet Take 4 tablets by mouth as needed. Pt take 4 tablets prior dental work (Patient not taking: Reported on 01/24/2020)    . Calcium Citrate-Vitamin D (CALCIUM + D PO) Take 1 tablet by mouth daily.    . Cholecalciferol (VITAMIN D3) 1000 units CAPS Take 1 capsule (1,000 Units total) by mouth daily with lunch. 60 capsule 0  . diclofenac Sodium (VOLTAREN) 1 % GEL Voltaren 1 % topical gel    . diltiazem (CARDIZEM CD) 180 MG 24 hr capsule Take 1 capsule (180 mg total) by mouth daily. 90 capsule 3  . diltiazem (TIAZAC) 180 MG 24 hr capsule Take 180 mg by mouth daily.     . diphenoxylate-atropine (LOMOTIL) 2.5-0.025 MG tablet Take 1 tablet by mouth 4 (four) times daily as needed for diarrhea or loose stools. 30 tablet 0  . furosemide (LASIX) 40 MG tablet TAKE ONE TABLET BY MOUTH DAILY 90 tablet 0  . gabapentin (NEURONTIN) 300 MG capsule Take 2 capsules (600 mg total) by mouth at bedtime. 180 capsule 1  . HYDROcodone-acetaminophen (NORCO/VICODIN) 5-325 MG tablet hydrocodone 5 mg-acetaminophen 325 mg tablet  Take 1 tablet every 6 hours by oral route as needed.    Marland Kitchen KLOR-CON M20 20 MEQ tablet TAKE TWO TABLETS BY MOUTH DAILY 180 tablet 0  . levothyroxine (SYNTHROID) 75 MCG tablet Take 1 tablet (75 mcg total) by mouth daily. 90 tablet 3  .  LORazepam (ATIVAN) 1 MG tablet TAKE ONE TABLET BY MOUTH EVERY NIGHT AT BEDTIME 30 tablet 3  . methocarbamol (ROBAXIN) 500 MG tablet TAKE ONE TABLET BY MOUTH EVERY 6 HOURS AS NEEDED FOR MUSCLE SPASMS 120 tablet 2  . montelukast (SINGULAIR) 10 MG tablet TAKE ONE TABLET BY MOUTH EVERY NIGHT AT BEDTIME 90 tablet 0  . predniSONE (DELTASONE) 10 MG tablet 3 tabs po qd x 3 days, then 2 tabs po qd x 3 days, then 1 tab po qd x 3 days 18 tablet 0  . SYMBICORT 160-4.5 MCG/ACT inhaler INHALE 1 TO 2 PUFFS BY MOUTH EVERY 12 HOURS THEN GARGLE  AND SPIT AFTER USE 10.2 g 9  . theophylline (UNIPHYL) 400 MG 24 hr tablet TAKE 1/2 TABLET BY MOUTH TWO TIMES A DAY 90 tablet 1  . traMADol (ULTRAM) 50 MG tablet Take 1 tablet (50 mg total) by mouth every 6 (six) hours as needed for moderate pain. 90 tablet 0   No current facility-administered medications on file prior to visit.    Past Medical History:  Diagnosis Date  . Adenomatous colon polyp 2007 & 2010    Dr [name]  . Allergy    seasonal  . Arthritis   . Asthma   . Cataract   . Diverticulosis   . 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 Hyperlipidemia   . Hypertension   . IBS (irritable bowel syndrome)   . Legally blind   . MVP (mitral valve prolapse)   . Paroxysmal atrial fibrillation with rapid ventricular response ([place]) 08/29/2017  . Peripheral vascular disease ([place])   . RAD (reactive airway disease)     Past Surgical History:  Procedure Laterality Date  . ABDOMINAL HYSTERECTOMY     with USO for dysfunctionall menses  . APPENDECTOMY    . ARTERY BIOPSY Bilateral 07/21/2017   Procedure: BIOPSY BILATERAL TEMPORAL ARTERY;  Surgeon: [name], MD;  Location: [facility];  Service: Vascular;  Laterality: Bilateral;  . CARDIOVERSION N/A 06/16/2018   Procedure: CARDIOVERSION;  Surgeon: [name], MD;  Location: [facility];  Service: Cardiovascular;  Laterality: N/A;  . CATARACT EXTRACTION W/ INTRAOCULAR LENS IMPLANT  2013   bilateral; Dr [name]  . COLONOSCOPY  2013   negative , due 2018; Dr [name]  . COLONOSCOPY W/ POLYPECTOMY      X2 ; diverticulosis  . EMBOLECTOMY Right 08/29/2017   Procedure: Right Groin Exploration  ;  Surgeon: Rosetta Posner, MD;  Location: [facility];  Service: Vascular;  Laterality: Right;  . HIP PINNING,CANNULATED Right 03/21/2018  . HIP PINNING,CANNULATED Right 03/21/2018   Procedure: CANNULATED HIP PINNING;  Surgeon: Rod Can, MD;  Location: [facility];  Service: Orthopedics;  Laterality: Right;  . LUMBAR LAMINECTOMY  2000   Dr  Vertell Limber  . SHOULDER SURGERY     R shoulder  . TONSILLECTOMY      Social History   Socioeconomic History  . Marital status: Married    Spouse name: Not on file  . Number of children: Not on file  . Years of education: Not on file  . Highest education level: Not on file  Occupational History  . Occupation: retired  Tobacco Use  . Smoking status: Former Smoker    Packs/day: 1.00    Years: 23.00    Pack years: 23.00    Quit date: 06/14/1989    Years since quitting: 30.8  . Smokeless tobacco: Never Used  . Tobacco comment: smoked 1961-1991 , up to <  1 ppd  Vaping Use  . Vaping Use: Never used  Substance and Sexual Activity  . Alcohol use: Yes    Alcohol/week: 14.0 standard drinks    Types: 14 Glasses of wine per week    Comment: Wine  . Drug use: No  . Sexual activity: Not on file  Other Topics Concern  . Not on file  Social History Narrative  . Not on file   Social Determinants of Health   Financial Resource Strain: Low Risk   . Difficulty of Paying Living Expenses: Not hard at all  Food Insecurity:   . Worried About Charity fundraiser in the Last Year: Not on file  . Ran Out of Food in the Last Year: Not on file  Transportation Needs:   . Lack of Transportation (Medical): Not on file  . Lack of Transportation (Non-Medical): Not on file  Physical Activity:   . Days of Exercise per Week: Not on file  . Minutes of Exercise per Session: Not on file  Stress:   . Feeling of Stress : Not on file  Social Connections:   . Frequency of Communication with Friends and Family: Not on file  . Frequency of Social Gatherings with Friends and Family: Not on file  . Attends Religious Services: Not on file  . Active Member of Clubs or Organizations: Not on file  . Attends Archivist Meetings: Not on file  . Marital Status: Not on file    Family History  Problem Relation Age of Onset  . Heart attack Brother 81  . Parkinsonism Mother   . Stroke Mother   . Heart  attack Father        pre 53  . Diabetes Sister   . COPD Sister        X 3  . Other Son        suicide 2016  . Colon cancer Neg Hx   . Cancer Neg Hx   . Esophageal cancer Neg Hx   . Liver cancer Neg Hx   . Pancreatic cancer Neg Hx   . Rectal cancer Neg Hx   . Stomach cancer Neg Hx     Review of Systems     Objective:  There were no vitals filed for this visit. BP Readings from Last 3 Encounters:  02/13/20 (!) 142/62  02/06/20 (!) 146/122  01/24/20 132/64   Wt Readings from Last 3 Encounters:  02/06/20 128 lb (58.1 kg)  01/24/20 130 lb 3.2 oz (59.1 kg)  01/06/20 136 lb 11 oz (62 kg)   There is no height or weight on file to calculate BMI.   Physical Exam         Assessment & Plan:    See Problem List for Assessment and Plan of chronic medical problems.    This visit occurred during the SARS-CoV-2 public health emergency.  Safety protocols were in place, including screening questions prior to the visit, additional usage of staff PPE, and extensive cleaning of exam room while observing appropriate contact time as indicated for disinfecting solutions.

## 2020-04-07 ENCOUNTER — Ambulatory Visit: Payer: Medicare Other | Admitting: Internal Medicine

## 2020-04-08 NOTE — Progress Notes (Signed)
Subjective:    Patient ID: Crystal [name], female    DOB: Mar 05, 1946, 74 y.o.   MRN: [id number]  HPI The patient is here for an acute visit.   Gout -she has Chronic left first MTP joint pain.  She states the pain is constant, but at times it will become severe and she cannot even touch the joint and that is when she knows it is the gout.  She was concerned that she was having chronic pain there.  She does have diffuse arthritis and wonders what she can take for that.  She is no longer on the blood thinner.  She did have stomach upset with NSAIDs in the past.  She does take a low-dose of prednisone daily.  She denies any swelling or severe pain in that joint today.    Medications and allergies reviewed with patient and updated if appropriate.  Patient Active Problem List   Diagnosis Date Noted  . Leg laceration, left, subsequent encounter 02/13/2020  . Chronic respiratory failure with hypoxia ([place]) 01/29/2020  . ILD (interstitial lung disease) ([place]) 01/29/2020  . Chronic gout 09/21/2019  . Vertigo of central origin 02/09/2019  . Other peripheral vertigo, unspecified ear 02/09/2019  . Pruritus 01/02/2019  . Hypokalemia 08/09/2018  . Long term (current) use of anticoagulants 08/01/2018  . Closed compression fracture of L4 vertebra ([place]) 07/11/2018  . Bilateral leg edema 07/11/2018  . Lung nodule < 6cm on CT 07/11/2018  . Hypothyroidism (acquired) 04/11/2018  . Fracture of femoral neck, right ([place]) 03/22/2018  . PAF (paroxysmal atrial fibrillation) ([place]) 03/18/2018  . 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 Herpes zoster without complication 40/98/1191  . Temporal arteritis ([place]) 07/18/2017  . Vision, loss, sudden, bilateral 07/18/2017  . Osteoarthritis 06/25/2016  . Anxiety 06/25/2016  . Back pain 07/18/2015  . 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 Carotid bruit present 06/05/2010  . Osteoporosis 06/03/2008  . History of colonic polyps 06/03/2008  . INTENTION TREMOR  12/06/2007  . IBS 10/16/2007  . Hyperlipidemia 06/06/2007  . Essential hypertension 06/06/2007  . Chronic obstructive airway disease with asthma ([place]) 02/23/2007    Current Outpatient Medications on File Prior to Visit  Medication Sig Dispense Refill  . albuterol (VENTOLIN HFA) 108 (90 Base) MCG/ACT inhaler INHALE ONE PUFF BY MOUTH TWICE A DAY AS NEEDED 8.5 g 1  . alendronate (FOSAMAX) 70 MG tablet TAKE 1 TABLET BY MOUTH ONCE WEEKLY ON AN EMPTY STOMACH BEFORE BREAKFAST. REMAIN UPRIGHT FOR 30 MINUTES & TAKE WITH 8 OUNCES OF WATER    . Calcium Citrate-Vitamin D (CALCIUM + D PO) Take 1 tablet by mouth daily.    . Cholecalciferol (VITAMIN D3) 1000 units CAPS Take 1 capsule (1,000 Units total) by mouth daily with lunch. 60 capsule 0  . diclofenac Sodium (VOLTAREN) 1 % GEL Voltaren 1 % topical gel    . diltiazem (TIAZAC) 180 MG 24 hr capsule Take 180 mg by mouth daily.     . diphenoxylate-atropine (LOMOTIL) 2.5-0.025 MG tablet Take 1 tablet by mouth 4 (four) times daily as needed for diarrhea or loose stools. 30 tablet 0  . furosemide (LASIX) 40 MG tablet TAKE ONE TABLET BY MOUTH DAILY 90 tablet 0  . gabapentin (NEURONTIN) 300 MG capsule Take 2 capsules (600 mg total) by mouth at bedtime. 180 capsule 1  . KLOR-CON M20 20 MEQ tablet TAKE TWO TABLETS BY MOUTH DAILY 180 tablet 0  . levothyroxine (SYNTHROID) 75 MCG tablet Take 1 tablet (75 mcg total)  by mouth daily. 90 tablet 3  . LORazepam (ATIVAN) 1 MG tablet TAKE ONE TABLET BY MOUTH EVERY NIGHT AT BEDTIME 30 tablet 3  . methocarbamol (ROBAXIN) 500 MG tablet TAKE ONE TABLET BY MOUTH EVERY 6 HOURS AS NEEDED FOR MUSCLE SPASMS 120 tablet 2  . montelukast (SINGULAIR) 10 MG tablet TAKE ONE TABLET BY MOUTH EVERY NIGHT AT BEDTIME 90 tablet 0  . predniSONE (DELTASONE) 5 MG tablet Take 5 mg by mouth daily.    . SYMBICORT 160-4.5 MCG/ACT inhaler INHALE 1 TO 2 PUFFS BY MOUTH EVERY 12 HOURS THEN GARGLE AND SPIT AFTER USE 10.2 g 9  . theophylline (UNIPHYL) 400  MG 24 hr tablet TAKE 1/2 TABLET BY MOUTH TWO TIMES A DAY 90 tablet 1  . amoxicillin (AMOXIL) 500 MG tablet Take 4 tablets by mouth as needed. Pt take 4 tablets prior dental work (Patient not taking: Reported on 01/24/2020)    . diltiazem (CARDIZEM CD) 180 MG 24 hr capsule Take 1 capsule (180 mg total) by mouth daily. 90 capsule 3   No current facility-administered medications on file prior to visit.    Past Medical History:  Diagnosis Date  . Adenomatous colon polyp 2007 & 2010    Dr [name]  . Allergy    seasonal  . Arthritis   . Asthma   . Cataract   . Diverticulosis   . 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 Hyperlipidemia   . Hypertension   . IBS (irritable bowel syndrome)   . Legally blind   . MVP (mitral valve prolapse)   . Paroxysmal atrial fibrillation with rapid ventricular response ([place]) 08/29/2017  . Peripheral vascular disease ([place])   . RAD (reactive airway disease)     Past Surgical History:  Procedure Laterality Date  . ABDOMINAL HYSTERECTOMY     with USO for dysfunctionall menses  . APPENDECTOMY    . ARTERY BIOPSY Bilateral 07/21/2017   Procedure: BIOPSY BILATERAL TEMPORAL ARTERY;  Surgeon: [name], MD;  Location: [facility];  Service: Vascular;  Laterality: Bilateral;  . CARDIOVERSION N/A 06/16/2018   Procedure: CARDIOVERSION;  Surgeon: [name], MD;  Location: [facility];  Service: Cardiovascular;  Laterality: N/A;  . CATARACT EXTRACTION W/ INTRAOCULAR LENS IMPLANT  2013   bilateral; Dr [name]  . COLONOSCOPY  2013   negative , due 2018; Dr [name]  . COLONOSCOPY W/ POLYPECTOMY      X2 ; diverticulosis  . EMBOLECTOMY Right 08/29/2017   Procedure: Right Groin Exploration  ;  Surgeon: Rosetta Posner, MD;  Location: [facility];  Service: Vascular;  Laterality: Right;  . HIP PINNING,CANNULATED Right 03/21/2018  . HIP PINNING,CANNULATED Right 03/21/2018   Procedure: CANNULATED HIP PINNING;  Surgeon: Rod Can, MD;  Location: [facility];   Service: Orthopedics;  Laterality: Right;  . LUMBAR LAMINECTOMY  2000   Dr Vertell Limber  . SHOULDER SURGERY     R shoulder  . TONSILLECTOMY      Social History   Socioeconomic History  . Marital status: Married    Spouse name: Not on file  . Number of children: Not on file  . Years of education: Not on file  . Highest education level: Not on file  Occupational History  . Occupation: retired  Tobacco Use  . Smoking status: Former Smoker    Packs/day: 1.00    Years: 23.00    Pack years: 23.00    Quit date: 06/14/1989    Years since quitting: 30.8  .  Smokeless tobacco: Never Used  . Tobacco comment: smoked 1961-1991 , up to < 1 ppd  Vaping Use  . Vaping Use: Never used  Substance and Sexual Activity  . Alcohol use: Yes    Alcohol/week: 14.0 standard drinks    Types: 14 Glasses of wine per week    Comment: Wine  . Drug use: No  . Sexual activity: Not on file  Other Topics Concern  . Not on file  Social History Narrative  . Not on file   Social Determinants of Health   Financial Resource Strain: Low Risk   . Difficulty of Paying Living Expenses: Not hard at all  Food Insecurity:   . Worried About Charity fundraiser in the Last Year: Not on file  . Ran Out of Food in the Last Year: Not on file  Transportation Needs:   . Lack of Transportation (Medical): Not on file  . Lack of Transportation (Non-Medical): Not on file  Physical Activity:   . Days of Exercise per Week: Not on file  . Minutes of Exercise per Session: Not on file  Stress:   . Feeling of Stress : Not on file  Social Connections:   . Frequency of Communication with Friends and Family: Not on file  . Frequency of Social Gatherings with Friends and Family: Not on file  . Attends Religious Services: Not on file  . Active Member of Clubs or Organizations: Not on file  . Attends Archivist Meetings: Not on file  . Marital Status: Not on file    Family History  Problem Relation Age of Onset  . Heart  attack Brother 40  . Parkinsonism Mother   . Stroke Mother   . Heart attack Father        pre 68  . Diabetes Sister   . COPD Sister        X 3  . Other Son        suicide 2016  . Colon cancer Neg Hx   . Cancer Neg Hx   . Esophageal cancer Neg Hx   . Liver cancer Neg Hx   . Pancreatic cancer Neg Hx   . Rectal cancer Neg Hx   . Stomach cancer Neg Hx     Review of Systems     Objective:   Vitals:   04/09/20 1345  BP: (!) 142/70  Pulse: 92  Temp: 98.3 F (36.8 C)  SpO2: 98%   BP Readings from Last 3 Encounters:  04/09/20 (!) 142/70  02/13/20 (!) 142/62  02/06/20 (!) 146/122   Wt Readings from Last 3 Encounters:  02/06/20 128 lb (58.1 kg)  01/24/20 130 lb 3.2 oz (59.1 kg)  01/06/20 136 lb 11 oz (62 kg)   Body mass index is 23.41 kg/m.   Physical Exam Constitutional:      General: She is not in acute distress.    Appearance: Normal appearance. She is not ill-appearing.  HENT:     Head: Normocephalic and atraumatic.  Musculoskeletal:        General: No tenderness (Right first MTP joint without tenderness with palpation.  No increased pain with passive or active movement.  No other toe pain or foot pain with palpation).     Right lower leg: Edema (Mild pitting) present.     Left lower leg: Edema (Left worse swelling than right-1+ pitting) present.  Skin:    General: Skin is warm and dry.     Findings: No erythema,  lesion or rash.  Neurological:     Mental Status: She is alert.            Assessment & Plan:    See Problem List for Assessment and Plan of chronic medical problems.    This visit occurred during the SARS-CoV-2 public health emergency.  Safety protocols were in place, including screening questions prior to the visit, additional usage of staff PPE, and extensive cleaning of exam room while observing appropriate contact time as indicated for disinfecting solutions.

## 2020-04-09 ENCOUNTER — Ambulatory Visit (INDEPENDENT_AMBULATORY_CARE_PROVIDER_SITE_OTHER): Payer: Medicare Other | Admitting: Internal Medicine

## 2020-04-09 ENCOUNTER — Other Ambulatory Visit: Payer: Self-pay

## 2020-04-09 ENCOUNTER — Encounter: Payer: Self-pay | Admitting: Internal Medicine

## 2020-04-09 DIAGNOSIS — M8949 Other hypertrophic osteoarthropathy, multiple sites: Secondary | ICD-10-CM

## 2020-04-09 DIAGNOSIS — M1A9XX0 Chronic gout, unspecified, without tophus (tophi): Secondary | ICD-10-CM | POA: Diagnosis not present

## 2020-04-09 DIAGNOSIS — M159 Polyosteoarthritis, unspecified: Secondary | ICD-10-CM

## 2020-04-09 MED ORDER — ALLOPURINOL 300 MG PO TABS: 300.0000 mg | ORAL_TABLET | Freq: Every day | ORAL | 1 refills | Status: DC

## 2020-04-09 MED ORDER — TRAMADOL HCL 50 MG PO TABS
50.0000 mg | ORAL_TABLET | Freq: Three times a day (TID) | ORAL | 0 refills | Status: DC | PRN
Start: 2020-04-09 — End: 2020-05-21

## 2020-04-09 NOTE — Patient Instructions (Addendum)
Increase the allopurinol to 300 mg daily    Take tramadol 50 mg 2-3 times a day as needed

## 2020-04-09 NOTE — Assessment & Plan Note (Signed)
Chronic Severe osteoarthritis in several joints including her toe joints which is likely causing the constant chronic pain Taking Tylenol No longer on a blood thinner and she could take an NSAID, but that has caused some stomach upset and I do worry about possible side effects Restart tramadol 2-3 times daily for chronic arthritic pain Medication sent to pharmacy

## 2020-04-09 NOTE — Assessment & Plan Note (Signed)
Chronic Intermittent-right first MTP Today she does not have an active gout flare Does have frequent gout flares Currently taking allopurinol 200 mg daily-we will increase to 300 mg daily for better prevention She will let me know if she has a gout flare in which case we will do a prednisone taper and possibly start colchicine as well

## 2020-04-26 DIAGNOSIS — J449 Chronic obstructive pulmonary disease, unspecified: Secondary | ICD-10-CM | POA: Diagnosis not present

## 2020-04-26 DIAGNOSIS — S72001D Fracture of unspecified part of neck of right femur, subsequent encounter for closed fracture with routine healing: Secondary | ICD-10-CM | POA: Diagnosis not present

## 2020-04-26 DIAGNOSIS — S72001A Fracture of unspecified part of neck of right femur, initial encounter for closed fracture: Secondary | ICD-10-CM | POA: Diagnosis not present

## 2020-04-30 ENCOUNTER — Other Ambulatory Visit: Payer: Self-pay | Admitting: Internal Medicine

## 2020-04-30 ENCOUNTER — Telehealth: Payer: Self-pay | Admitting: Pharmacist

## 2020-04-30 NOTE — Progress Notes (Signed)
Chronic Care Management Pharmacy Assistant   Name: Crystal [name]  MRN: [id number] DOB: Oct 27, 1945  Reason for Encounter: General Adherence Call  Patient Questions:  1.  Have you seen any other providers since your last visit? Yes, patient last seen Dr. Ivan Anchors on 04/09/20  2.  Any changes in your medicines or health? Yes, Dr. [name] increased allopurinol to 300 mg, prednisone 5mg  and increased tramadol to 50 mg every 8 hours    PCP : [name], MD  Allergies:   Allergies  Allergen Reactions  . Simvastatin     myalgias    Medications: Outpatient Encounter Medications as of 04/30/2020  Medication Sig  . albuterol (VENTOLIN HFA) 108 (90 Base) MCG/ACT inhaler INHALE ONE PUFF BY MOUTH TWICE A DAY AS NEEDED  . alendronate (FOSAMAX) 70 MG tablet TAKE 1 TABLET BY MOUTH ONCE WEEKLY ON AN EMPTY STOMACH BEFORE BREAKFAST. REMAIN UPRIGHT FOR 30 MINUTES & TAKE WITH 8 OUNCES OF WATER  . allopurinol (ZYLOPRIM) 300 MG tablet Take 1 tablet (300 mg total) by mouth daily.  Marland Kitchen amoxicillin (AMOXIL) 500 MG tablet Take 4 tablets by mouth as needed. Pt take 4 tablets prior dental work (Patient not taking: Reported on 01/24/2020)  . Calcium Citrate-Vitamin D (CALCIUM + D PO) Take 1 tablet by mouth daily.  . Cholecalciferol (VITAMIN D3) 1000 units CAPS Take 1 capsule (1,000 Units total) by mouth daily with lunch.  . diclofenac Sodium (VOLTAREN) 1 % GEL Voltaren 1 % topical gel  . diltiazem (CARDIZEM CD) 180 MG 24 hr capsule Take 1 capsule (180 mg total) by mouth daily.  Marland Kitchen diltiazem (TIAZAC) 180 MG 24 hr capsule Take 180 mg by mouth daily.   . diphenoxylate-atropine (LOMOTIL) 2.5-0.025 MG tablet Take 1 tablet by mouth 4 (four) times daily as needed for diarrhea or loose stools.  . furosemide (LASIX) 40 MG tablet TAKE ONE TABLET BY MOUTH DAILY  . gabapentin (NEURONTIN) 300 MG capsule Take 2 capsules (600 mg total) by mouth at bedtime.  Marland Kitchen KLOR-CON M20 20 MEQ tablet TAKE TWO TABLETS BY MOUTH DAILY  .  levothyroxine (SYNTHROID) 75 MCG tablet Take 1 tablet (75 mcg total) by mouth daily.  Marland Kitchen LORazepam (ATIVAN) 1 MG tablet TAKE ONE TABLET BY MOUTH EVERY NIGHT AT BEDTIME  . methocarbamol (ROBAXIN) 500 MG tablet TAKE ONE TABLET BY MOUTH EVERY 6 HOURS AS NEEDED FOR MUSCLE SPASMS  . montelukast (SINGULAIR) 10 MG tablet TAKE ONE TABLET BY MOUTH EVERY NIGHT AT BEDTIME  . predniSONE (DELTASONE) 5 MG tablet Take 5 mg by mouth daily.  . SYMBICORT 160-4.5 MCG/ACT inhaler INHALE 1 TO 2 PUFFS BY MOUTH EVERY 12 HOURS THEN GARGLE AND SPIT AFTER USE  . theophylline (UNIPHYL) 400 MG 24 hr tablet TAKE 1/2 TABLET BY MOUTH TWO TIMES A DAY  . traMADol (ULTRAM) 50 MG tablet Take 1 tablet (50 mg total) by mouth every 8 (eight) hours as needed for moderate pain (chronic osteoarthritis pain).   No facility-administered encounter medications on file as of 04/30/2020.    Current Diagnosis: Patient Active Problem List   Diagnosis Date Noted  . Leg laceration, left, subsequent encounter 02/13/2020  . Chronic respiratory failure with hypoxia ([place]) 01/29/2020  . ILD (interstitial lung disease) ([place]) 01/29/2020  . Chronic gout 09/21/2019  . Vertigo of central origin 02/09/2019  . Other peripheral vertigo, unspecified ear 02/09/2019  . Pruritus 01/02/2019  . Hypokalemia 08/09/2018  . Long term (current) use of anticoagulants 08/01/2018  . Closed compression  fracture of L4 vertebra ([place]) 07/11/2018  . Bilateral leg edema 07/11/2018  . Lung nodule < 6cm on CT 07/11/2018  . Hypothyroidism (acquired) 04/11/2018  . Fracture of femoral neck, right ([place]) 03/22/2018  . PAF (paroxysmal atrial fibrillation) ([place]) 03/18/2018  . 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 Herpes zoster without complication 51/76/1607  . Temporal arteritis ([place]) 07/18/2017  . Vision, loss, sudden, bilateral 07/18/2017  . Osteoarthritis 06/25/2016  . Anxiety 06/25/2016  . Back pain 07/18/2015  . 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 Carotid  bruit present 06/05/2010  . Osteoporosis 06/03/2008  . History of colonic polyps 06/03/2008  . INTENTION TREMOR 12/06/2007  . IBS 10/16/2007  . Hyperlipidemia 06/06/2007  . Essential hypertension 06/06/2007  . Chronic obstructive airway disease with asthma ([place]) 02/23/2007    Goals Addressed   None     Follow-Up:  Pharmacist Review   Spoke with the patient and told her that I was calling to follow up with her since her last visit with the clinical pharmacist Mendel Ryder. I ask the patient was she having any problems with her health and the patient stated that she still having some issues with gout. She states that her cardiologist advised her to use a walker because of her constant falling. The patient states that her cardiologist discontinued her Eliquis which she has been off of for 3 or 4 months. The patient states that she has not had a problems with getting her meds from her pharmacy. She is trying to continue good eating habits but does not get much exercise because he has to use a walker. I told the patient that I would pass along this information to [place].   Wendy Poet, Clinical Pharmacist Assistant [facility]

## 2020-05-12 ENCOUNTER — Other Ambulatory Visit: Payer: Self-pay | Admitting: Internal Medicine

## 2020-05-20 ENCOUNTER — Other Ambulatory Visit: Payer: Self-pay | Admitting: Internal Medicine

## 2020-05-26 ENCOUNTER — Encounter (HOSPITAL_COMMUNITY): Payer: Self-pay | Admitting: Pediatrics

## 2020-05-26 ENCOUNTER — Telehealth: Payer: Self-pay | Admitting: Internal Medicine

## 2020-05-26 ENCOUNTER — Emergency Department (HOSPITAL_COMMUNITY): Payer: Medicare Other

## 2020-05-26 ENCOUNTER — Other Ambulatory Visit: Payer: Self-pay

## 2020-05-26 ENCOUNTER — Emergency Department (HOSPITAL_COMMUNITY)
Admission: EM | Admit: 2020-05-26 | Discharge: 2020-05-26 | Disposition: A | Discharge status: Home / Self Care | Payer: Medicare Other | Attending: Emergency Medicine | Admitting: Emergency Medicine

## 2020-05-26 DIAGNOSIS — I4819 Other persistent atrial fibrillation: Secondary | ICD-10-CM | POA: Diagnosis not present

## 2020-05-26 DIAGNOSIS — I10 Essential (primary) hypertension: Secondary | ICD-10-CM | POA: Diagnosis not present

## 2020-05-26 DIAGNOSIS — K5730 Diverticulosis of large intestine without perforation or abscess without bleeding: Secondary | ICD-10-CM | POA: Diagnosis not present

## 2020-05-26 DIAGNOSIS — R1084 Generalized abdominal pain: Secondary | ICD-10-CM | POA: Diagnosis not present

## 2020-05-26 DIAGNOSIS — K5792 Diverticulitis of intestine, part unspecified, without perforation or abscess without bleeding: Secondary | ICD-10-CM

## 2020-05-26 DIAGNOSIS — R52 Pain, unspecified: Secondary | ICD-10-CM | POA: Diagnosis not present

## 2020-05-26 DIAGNOSIS — I4891 Unspecified atrial fibrillation: Secondary | ICD-10-CM

## 2020-05-26 DIAGNOSIS — Z87891 Personal history of nicotine dependence: Secondary | ICD-10-CM | POA: Insufficient documentation

## 2020-05-26 DIAGNOSIS — J45909 Unspecified asthma, uncomplicated: Secondary | ICD-10-CM | POA: Insufficient documentation

## 2020-05-26 DIAGNOSIS — R918 Other nonspecific abnormal finding of lung field: Secondary | ICD-10-CM | POA: Diagnosis not present

## 2020-05-26 DIAGNOSIS — D6869 Other thrombophilia: Secondary | ICD-10-CM | POA: Diagnosis not present

## 2020-05-26 DIAGNOSIS — S72001D Fracture of unspecified part of neck of right femur, subsequent encounter for closed fracture with routine healing: Secondary | ICD-10-CM | POA: Diagnosis not present

## 2020-05-26 DIAGNOSIS — E039 Hypothyroidism, unspecified: Secondary | ICD-10-CM | POA: Diagnosis not present

## 2020-05-26 DIAGNOSIS — J449 Chronic obstructive pulmonary disease, unspecified: Secondary | ICD-10-CM | POA: Diagnosis not present

## 2020-05-26 DIAGNOSIS — K760 Fatty (change of) liver, not elsewhere classified: Secondary | ICD-10-CM | POA: Diagnosis not present

## 2020-05-26 DIAGNOSIS — Z79899 Other long term (current) drug therapy: Secondary | ICD-10-CM | POA: Diagnosis not present

## 2020-05-26 DIAGNOSIS — R109 Unspecified abdominal pain: Secondary | ICD-10-CM | POA: Diagnosis present

## 2020-05-26 DIAGNOSIS — R000 Tachycardia, unspecified: Secondary | ICD-10-CM | POA: Diagnosis not present

## 2020-05-26 DIAGNOSIS — S72001A Fracture of unspecified part of neck of right femur, initial encounter for closed fracture: Secondary | ICD-10-CM | POA: Diagnosis not present

## 2020-05-26 DIAGNOSIS — R0902 Hypoxemia: Secondary | ICD-10-CM | POA: Diagnosis not present

## 2020-05-26 LAB — PROTIME-INR
INR: 1.1 (ref 0.8–1.2)
Prothrombin Time: 13.4 seconds (ref 11.4–15.2)

## 2020-05-26 LAB — COMPREHENSIVE METABOLIC PANEL
ALT: 18 U/L (ref 0–44)
AST: 36 U/L (ref 15–41)
Albumin: 3.5 g/dL (ref 3.5–5.0)
Alkaline Phosphatase: 117 U/L (ref 38–126)
Anion gap: 14 (ref 5–15)
BUN: 5 mg/dL — ABNORMAL LOW (ref 8–23)
CO2: 23 mmol/L (ref 22–32)
Calcium: 9.1 mg/dL (ref 8.9–10.3)
Chloride: 100 mmol/L (ref 98–111)
Creatinine, Ser: 0.62 mg/dL (ref 0.44–1.00)
GFR, Estimated: >60 mL/min (ref >60)
Glucose, Bld: 85 mg/dL (ref 70–99)
Potassium: 3.5 mmol/L (ref 3.5–5.1)
Sodium: 137 mmol/L (ref 135–145)
Total Bilirubin: 0.8 mg/dL (ref 0.3–1.2)
Total Protein: 5.9 g/dL — ABNORMAL LOW (ref 6.5–8.1)

## 2020-05-26 LAB — URINALYSIS, ROUTINE W REFLEX MICROSCOPIC
Bilirubin Urine: NEGATIVE
Glucose, UA: NEGATIVE mg/dL
Hgb urine dipstick: NEGATIVE
Ketones, ur: NEGATIVE mg/dL
Leukocytes,Ua: NEGATIVE
Nitrite: NEGATIVE
Protein, ur: NEGATIVE mg/dL
Specific Gravity, Urine: 1.016 (ref 1.005–1.030)
pH: 6 (ref 5.0–8.0)

## 2020-05-26 LAB — LACTIC ACID, PLASMA: Lactic Acid, Venous: 1 mmol/L (ref 0.5–1.9)

## 2020-05-26 LAB — LIPASE, BLOOD: Lipase: 18 U/L (ref 11–51)

## 2020-05-26 LAB — CBC
HCT: 35.2 % — ABNORMAL LOW (ref 36.0–46.0)
Hemoglobin: 10.1 g/dL — ABNORMAL LOW (ref 12.0–15.0)
MCH: 25.1 pg — ABNORMAL LOW (ref 26.0–34.0)
MCHC: 28.7 g/dL — ABNORMAL LOW (ref 30.0–36.0)
MCV: 87.3 fL (ref 80.0–100.0)
Platelets: 465 10*3/uL — ABNORMAL HIGH (ref 150–400)
RBC: 4.03 MIL/uL (ref 3.87–5.11)
RDW: 20.8 % — ABNORMAL HIGH (ref 11.5–15.5)
WBC: 25.2 10*3/uL — ABNORMAL HIGH (ref 4.0–10.5)
nRBC: 0 % (ref 0.0–0.2)

## 2020-05-26 LAB — MAGNESIUM: Magnesium: 1.4 mg/dL — ABNORMAL LOW (ref 1.7–2.4)

## 2020-05-26 LAB — POC OCCULT BLOOD, ED: Fecal Occult Bld: NEGATIVE

## 2020-05-26 LAB — APTT: aPTT: 29 seconds (ref 24–36)

## 2020-05-26 MED ORDER — IOHEXOL 300 MG/ML  SOLN
100.0000 mL | Freq: Once | INTRAMUSCULAR | Status: AC | PRN
  Administered 2020-05-26: 100 mL via INTRAVENOUS

## 2020-05-26 MED ORDER — AMOXICILLIN-POT CLAVULANATE 875-125 MG PO TABS
1.0000 | ORAL_TABLET | Freq: Once | ORAL | Status: AC
  Administered 2020-05-26: 1 via ORAL
  Filled 2020-05-26: qty 1

## 2020-05-26 MED ORDER — DILTIAZEM HCL ER COATED BEADS 180 MG PO CP24
180.0000 mg | ORAL_CAPSULE | Freq: Once | ORAL | Status: AC
  Administered 2020-05-26: 180 mg via ORAL
  Filled 2020-05-26: qty 1

## 2020-05-26 MED ORDER — DILTIAZEM HCL 30 MG PO TABS
30.0000 mg | ORAL_TABLET | Freq: Once | ORAL | Status: AC
  Administered 2020-05-26: 30 mg via ORAL
  Filled 2020-05-26: qty 1

## 2020-05-26 NOTE — Telephone Encounter (Signed)
Patient followed instructions of [facility] and went to the ED

## 2020-05-26 NOTE — Discharge Instructions (Signed)
You are seen in the ER for dark tarry stools.  The work-up in the ER is not revealing bloody stools. We are concerned about infection in your abdomen, please take the antibiotics prescribed.  Additionally, your noted to be back in atrial fibrillation.  Please call the A. fib clinic to get an appointment as soon as possible.

## 2020-05-26 NOTE — ED Provider Notes (Signed)
[name] EMERGENCY DEPARTMENT Provider Note   CSN: [id number] Arrival date & time: 05/26/20  1122     History Chief Complaint  Patient presents with  . Abdominal Pain  . Diarrhea    Crystal [name] is a 74 y.o. female.  HPI     74 year old female comes in a chief complaint of abdominal pain and diarrhea.  Her main complaint is dark tarry stools. Patient has history of IBS, paroxysmal A. fib status post ablation and not on any anticoagulation. She reports that she had diarrheal bowel movements for the last 2 days. She has been having 3-4 BM per day. Her husband was at the bedside reported that stools have been dark and tarry, and he had upper GI bleed with similar stools therefore he decided to bring her in. Patient has no complaints from her side. She has mild abdominal discomfort, which she thought that all of her symptoms were because of IBS. She denies any burning with urination, blood in the urine, back pain. Review of system is negative for any new cough, fevers, chills.   Patient is noted to be tachycardic. She reports that since ablation she has not been in A. fib. She did not take her morning A. fib medications. She denies any chest pain, shortness of breath, dizziness, near fainting spells. Patient is not on any anticoagulation.  Past Medical History:  Diagnosis Date  . Adenomatous colon polyp 2007 & 2010    Dr [name]  . Allergy    seasonal  . Arthritis   . Asthma   . Cataract   . Diverticulosis   . 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 Hyperlipidemia   . Hypertension   . IBS (irritable bowel syndrome)   . Legally blind   . MVP (mitral valve prolapse)   . Paroxysmal atrial fibrillation with rapid ventricular response ([place]) 08/29/2017  . Peripheral vascular disease ([place])   . RAD (reactive airway disease)     Patient Active Problem List   Diagnosis Date Noted  . Leg laceration, left, subsequent encounter 02/13/2020  . Chronic  respiratory failure with hypoxia ([place]) 01/29/2020  . ILD (interstitial lung disease) ([place]) 01/29/2020  . Chronic gout 09/21/2019  . Vertigo of central origin 02/09/2019  . Other peripheral vertigo, unspecified ear 02/09/2019  . Pruritus 01/02/2019  . Hypokalemia 08/09/2018  . Long term (current) use of anticoagulants 08/01/2018  . Closed compression fracture of L4 vertebra ([place]) 07/11/2018  . Bilateral leg edema 07/11/2018  . Lung nodule < 6cm on CT 07/11/2018  . Hypothyroidism (acquired) 04/11/2018  . Fracture of femoral neck, right ([place]) 03/22/2018  . PAF (paroxysmal atrial fibrillation) ([place]) 03/18/2018  . 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 Herpes zoster without complication 08/67/6195  . Temporal arteritis ([place]) 07/18/2017  . Vision, loss, sudden, bilateral 07/18/2017  . Osteoarthritis 06/25/2016  . Anxiety 06/25/2016  . Back pain 07/18/2015  . 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 Carotid bruit present 06/05/2010  . Osteoporosis 06/03/2008  . History of colonic polyps 06/03/2008  . INTENTION TREMOR 12/06/2007  . IBS 10/16/2007  . Hyperlipidemia 06/06/2007  . Essential hypertension 06/06/2007  . Chronic obstructive airway disease with asthma ([place]) 02/23/2007    Past Surgical History:  Procedure Laterality Date  . ABDOMINAL HYSTERECTOMY     with USO for dysfunctionall menses  . APPENDECTOMY    . ARTERY BIOPSY Bilateral 07/21/2017   Procedure: BIOPSY BILATERAL TEMPORAL ARTERY;  Surgeon: [name], MD;  Location: [facility]  OR;  Service: Vascular;  Laterality: Bilateral;  . CARDIOVERSION N/A 06/16/2018   Procedure: CARDIOVERSION;  Surgeon: [name], MD;  Location: [place];  Service: Cardiovascular;  Laterality: N/A;  . CATARACT EXTRACTION W/ INTRAOCULAR LENS IMPLANT  2013   bilateral; Dr [name]  . COLONOSCOPY  2013   negative , due 2018; Dr [name]  . COLONOSCOPY W/ POLYPECTOMY      X2 ; diverticulosis  . EMBOLECTOMY Right 08/29/2017   Procedure:  Right Groin Exploration  ;  Surgeon: Rosetta Posner, MD;  Location: [place];  Service: Vascular;  Laterality: Right;  . HIP PINNING,CANNULATED Right 03/21/2018  . HIP PINNING,CANNULATED Right 03/21/2018   Procedure: CANNULATED HIP PINNING;  Surgeon: Rod Can, MD;  Location: [place];  Service: Orthopedics;  Laterality: Right;  . LUMBAR LAMINECTOMY  2000   Dr Vertell Limber  . SHOULDER SURGERY     R shoulder  . TONSILLECTOMY       OB History   No obstetric history on file.     Family History  Problem Relation Age of Onset  . Heart attack Brother 20  . Parkinsonism Mother   . Stroke Mother   . Heart attack Father        pre 41  . Diabetes Sister   . COPD Sister        X 3  . Other Son        suicide 2016  . Colon cancer Neg Hx   . Cancer Neg Hx   . Esophageal cancer Neg Hx   . Liver cancer Neg Hx   . Pancreatic cancer Neg Hx   . Rectal cancer Neg Hx   . Stomach cancer Neg Hx     Social History   Tobacco Use  . Smoking status: Former Smoker    Packs/day: 1.00    Years: 23.00    Pack years: 23.00    Quit date: 06/14/1989    Years since quitting: 30.9  . Smokeless tobacco: Never Used  . Tobacco comment: smoked 1961-1991 , up to < 1 ppd  Vaping Use  . Vaping Use: Never used  Substance Use Topics  . Alcohol use: Yes    Alcohol/week: 14.0 standard drinks    Types: 14 Glasses of wine per week    Comment: Wine  . Drug use: No    Home Medications Prior to Admission medications   Medication Sig Start Date End Date Taking? Authorizing Provider  albuterol (VENTOLIN HFA) 108 (90 Base) MCG/ACT inhaler INHALE ONE PUFF BY MOUTH TWICE A DAY AS NEEDED Patient taking differently: Inhale 1-2 puffs into the lungs every 6 (six) hours as needed for wheezing. 05/01/20  Yes Burns, Claudina Lick, MD  allopurinol (ZYLOPRIM) 300 MG tablet Take 1 tablet (300 mg total) by mouth daily. 04/09/20  Yes Burns, Claudina Lick, MD  Calcium Citrate-Vitamin D (CALCIUM + D PO) Take 1 tablet by mouth daily.   Yes  [provider]  Cholecalciferol (VITAMIN D3) 1000 units CAPS Take 1 capsule (1,000 Units total) by mouth daily with lunch. Patient taking differently: Take 1,000 Units by mouth daily. 03/30/18  Yes Angiulli, Lavon Paganini, PA-C  diclofenac Sodium (VOLTAREN) 1 % GEL Apply 2 g topically 2 (two) times daily as needed (back pain).   Yes [provider]  diltiazem (CARDIZEM CD) 180 MG 24 hr capsule Take 1 capsule (180 mg total) by mouth daily. 10/29/19 01/27/20 Yes [name], MD  diphenoxylate-atropine (LOMOTIL) 2.5-0.025 MG tablet Take 1  tablet by mouth 4 (four) times daily as needed for diarrhea or loose stools. 02/07/20  Yes Burns, Claudina Lick, MD  gabapentin (NEURONTIN) 300 MG capsule Take 2 capsules (600 mg total) by mouth at bedtime. 09/21/19  Yes Burns, Claudina Lick, MD  KLOR-CON M20 20 MEQ tablet TAKE TWO TABLETS BY MOUTH DAILY Patient taking differently: Take 20 mEq by mouth daily. 02/04/20  Yes Burns, Claudina Lick, MD  levothyroxine (SYNTHROID) 75 MCG tablet Take 1 tablet (75 mcg total) by mouth daily. 01/16/20  Yes Burns, Claudina Lick, MD  LORazepam (ATIVAN) 1 MG tablet TAKE ONE TABLET BY MOUTH EVERY NIGHT AT BEDTIME Patient taking differently: Take 1 mg by mouth at bedtime. 05/13/20  Yes Burns, Claudina Lick, MD  methocarbamol (ROBAXIN) 500 MG tablet TAKE ONE TABLET BY MOUTH EVERY 6 HOURS AS NEEDED FOR MUSCLE SPASMS Patient taking differently: Take 500 mg by mouth every 6 (six) hours as needed for muscle spasms. 03/03/20  Yes Burns, Claudina Lick, MD  montelukast (SINGULAIR) 10 MG tablet TAKE ONE TABLET BY MOUTH EVERY NIGHT AT BEDTIME Patient taking differently: Take 10 mg by mouth at bedtime. 03/24/20  Yes Burns, Claudina Lick, MD  predniSONE (DELTASONE) 5 MG tablet Take 5 mg by mouth daily. 03/12/20  Yes [provider]  SYMBICORT 160-4.5 MCG/ACT inhaler INHALE 1 TO 2 PUFFS BY MOUTH EVERY 12 HOURS THEN GARGLE AND SPIT AFTER USE Patient taking differently: Inhale 2 puffs into the lungs 2 times daily at 12  noon and 4 pm. 01/21/20  Yes Burns, Claudina Lick, MD  theophylline (UNIPHYL) 400 MG 24 hr tablet TAKE 1/2 TABLET BY MOUTH TWO TIMES A DAY Patient taking differently: Take 200 mg by mouth in the morning and at bedtime. 01/14/20  Yes Burns, Claudina Lick, MD  traMADol (ULTRAM) 50 MG tablet TAKE ONE TABLET BY MOUTH EVERY 8 HOURS AS NEEDED FOR MODERATE PAIN Patient taking differently: Take 50 mg by mouth 3 (three) times daily. 05/21/20  Yes Burns, Claudina Lick, MD  furosemide (LASIX) 40 MG tablet TAKE ONE TABLET BY MOUTH DAILY Patient not taking: No sig reported 02/04/20   [name], MD    Allergies    Simvastatin  Review of Systems   Review of Systems  Constitutional: Positive for activity change.  Respiratory: Negative for shortness of breath.   Cardiovascular: Negative for chest pain.  Gastrointestinal: Positive for abdominal pain and diarrhea. Negative for nausea and vomiting.  Genitourinary: Negative for dysuria.  Allergic/Immunologic: Negative for immunocompromised state.  Hematological: Does not bruise/bleed easily.  All other systems reviewed and are negative.   Physical Exam Updated Vital Signs BP 125/68   Pulse (!) 125   Temp 98.4 F (36.9 C) (Oral)   Resp 16   Ht 5\' 3"  (1.6 m)   Wt 54.4 kg   SpO2 97%   BMI 21.26 kg/m   Physical Exam Vitals and nursing note reviewed.  Constitutional:      Appearance: She is well-developed.  HENT:     Head: Normocephalic and atraumatic.  Eyes:     Extraocular Movements: EOM normal.  Cardiovascular:     Rate and Rhythm: Normal rate.  Pulmonary:     Effort: Pulmonary effort is normal.  Abdominal:     General: Bowel sounds are normal.     Tenderness: There is no abdominal tenderness. There is no guarding or rebound.  Musculoskeletal:     Cervical back: Normal range of motion and neck supple.  Skin:    General: Skin  is warm and dry.  Neurological:     Mental Status: She is alert and oriented to person, place, and time.     ED Results /  Procedures / Treatments   Labs (all labs ordered are listed, but only abnormal results are displayed) Labs Reviewed  COMPREHENSIVE METABOLIC PANEL - Abnormal; Notable for the following components:      Result Value   BUN 5 (*)    Total Protein 5.9 (*)    All other components within normal limits  CBC - Abnormal; Notable for the following components:   WBC 25.2 (*)    Hemoglobin 10.1 (*)    HCT 35.2 (*)    MCH 25.1 (*)    MCHC 28.7 (*)    RDW 20.8 (*)    Platelets 465 (*)    All other components within normal limits  MAGNESIUM - Abnormal; Notable for the following components:   Magnesium 1.4 (*)    All other components within normal limits  URINE CULTURE  CULTURE, BLOOD (ROUTINE X 2)  CULTURE, BLOOD (ROUTINE X 2)  LIPASE, BLOOD  PROTIME-INR  APTT  LACTIC ACID, PLASMA  URINALYSIS, ROUTINE W REFLEX MICROSCOPIC  LACTIC ACID, PLASMA  POC OCCULT BLOOD, ED    EKG EKG Interpretation  Date/Time:  Monday May 26 2020 11:32:26 EST Ventricular Rate:  129 PR Interval:    QRS Duration: 109 QT Interval:  382 QTC Calculation: 560 R Axis:   -146 Text Interpretation: Atrial fibrillation Probable anterolateral infarct, age indeterm Prolonged QT interval No acute changes No significant change since last tracing Confirmed by Varney Biles [phone]) on 05/26/2020 12:19:38 PM   Radiology CT ABDOMEN PELVIS W CONTRAST  Result Date: 05/26/2020 CLINICAL DATA:  Acute lower abdominal pain. EXAM: CT ABDOMEN AND PELVIS WITH CONTRAST TECHNIQUE: Multidetector CT imaging of the abdomen and pelvis was performed using the standard protocol following bolus administration of intravenous contrast. CONTRAST:  127mL OMNIPAQUE IOHEXOL 300 MG/ML  SOLN COMPARISON:  July 10, 2018. FINDINGS: Lower chest: Moderate bilateral pleural effusions are noted with adjacent subsegmental atelectasis of both lower lobes. Stable 4 mm nodule is noted in right middle lobe laterally which is unchanged compared to prior  exam and no further follow-up required is required. Hepatobiliary: Hepatic steatosis. No gallstones or biliary dilatation is noted. Pancreas: Unremarkable. No pancreatic ductal dilatation or surrounding inflammatory changes. Spleen: Normal in size without focal abnormality. Adrenals/Urinary Tract: Adrenal glands are unremarkable. Kidneys are normal, without renal calculi, focal lesion, or hydronephrosis. Bladder is unremarkable. Stomach/Bowel: Status post appendectomy. The stomach is unremarkable. There is no evidence of bowel obstruction. Extensive sigmoid diverticulosis is noted. There is interval development of significant wall and fold thickening involving the sigmoid colon concerning for infectious or inflammatory colitis or diverticulitis. Vascular/Lymphatic: Aortic atherosclerosis. No enlarged abdominal or pelvic lymph nodes. Reproductive: Status post hysterectomy. No adnexal masses. Other: No abdominal wall hernia or abnormality. No abdominopelvic ascites. Musculoskeletal: No acute or significant osseous findings. IMPRESSION: 1. Moderate bilateral pleural effusions are noted with adjacent subsegmental atelectasis of both lower lobes. 2. Hepatic steatosis. 3. Extensive sigmoid diverticulosis is noted. There is interval development of significant wall and fold thickening involving the sigmoid colon concerning for infectious or inflammatory colitis, or diverticulitis. 4. Aortic atherosclerosis. Aortic Atherosclerosis (ICD10-I70.0). Electronically Signed   By: Marijo Conception M.D.   On: 05/26/2020 15:55   DG Chest Port 1 View  Result Date: 05/26/2020 CLINICAL DATA:  Lower abdominal pain that comes when patient is about to have a  bowel movement. EXAM: PORTABLE CHEST 1 VIEW COMPARISON:  CT chest 03/28/2020 FINDINGS: Mild cardiac enlargement. Aortic atherosclerosis. Diffuse, bilateral reticular interstitial opacities are identified and appear peripheral and lower lung zone predominant. No superimposed airspace  consolidation. IMPRESSION: 1. Peripheral and lower lung zone predominant reticular interstitial opacities compatible with mild chronic interstitial lung disease. 2. No superimposed airspace consolidation. Electronically Signed   By: Kerby Moors M.D.   On: 05/26/2020 14:04    Procedures .Critical Care Performed by: Varney Biles, MD Authorized by: Varney Biles, MD   Critical care provider statement:    Critical care time (minutes):  52   Critical care was necessary to treat or prevent imminent or life-threatening deterioration of the following conditions:  Cardiac failure   Critical care was time spent personally by me on the following activities:  Discussions with consultants, evaluation of patient's response to treatment, examination of patient, ordering and performing treatments and interventions, ordering and review of laboratory studies, ordering and review of radiographic studies, pulse oximetry, re-evaluation of patient's condition, obtaining history from patient or surrogate and review of old charts Comments:     Profound tachycardia   (including critical care time)  Medications Ordered in ED Medications  diltiazem (CARDIZEM CD) 24 hr capsule 180 mg (has no administration in time range)  amoxicillin-clavulanate (AUGMENTIN) 875-125 MG per tablet 1 tablet (has no administration in time range)  diltiazem (CARDIZEM) tablet 30 mg (30 mg Oral Given 05/26/20 1304)  iohexol (OMNIPAQUE) 300 MG/ML solution 100 mL (100 mLs Intravenous Contrast Given 05/26/20 1528)    ED Course  I have reviewed the triage vital signs and the nursing notes.  Pertinent labs & imaging results that were available during my care of the patient were reviewed by me and considered in my medical decision making (see chart for details).  Clinical Course as of 05/26/20 1712  Mon May 26, 2020  1710 CT ABDOMEN PELVIS W CONTRAST CT scan revealed diverticulitis.  Patient will be started on Augmentin. [AN]  1710  Pulse Rate(!): 125 Patient's home dose of diltiazem 180 mg provided.  Patient ambulated in the ER and she had no shortness of breath, chest pain.  Heart rate stayed in the 120s.  No hypoxia.  She is comfortable going home.  She will receive her home dose diltiazem before going home. [AN]  1711 I have decided not to start patient on anticoagulation.  She came in with dark tarry stools and is noted to have diverticulitis.  Best to reserve anticoagulation initiation after she has overcome the colitis.  Patient is comfortable with the plan.  Advised her to follow-up with the A. fib clinic.  I have also sent a secure chat message to cardiology service. [AN]    Clinical Course User Index [AN] Varney Biles, MD   MDM Rules/Calculators/A&P                          74 year old female comes in a chief complaint of diarrhea. She is also been having some cramping abdominal pain. Symptoms have been ongoing for 3 days. There is history of IBS, A. fib status post ablation. Husband brought her here primarily because of dark, tarry stools that he noted, with concerns that patient might be having GI bleed.  Digital rectal exam did not reveal melena. Patient did have dark stool. It is guaiac negative. Patient denies any sick contacts, URI-like symptoms, recent antibiotics or history of C. difficile colitis. She has significantly elevated  white count, but she repeatedly denies any fevers, chills, UTI-like symptoms and tells me that she would not have come here if it were not for dark, tarry stools.  Differential includes questionable infection. Given the elevated white count, we will get CT abdomen and pelvis. Clinically at this time she does not appear septic.  Patient is in A. fib with RVR. The RVR could be a result of increased stress on her rather than organic cardiac issue. She has not taken her morning meds which are also probably contributing. We will give her oral dose and reassess.        CHA2DS2-VASc  Score = 4  The patient's score is based upon: CHF History: No HTN History: Yes Diabetes History: No Stroke History: No Vascular Disease History: No      ASSESSMENT AND PLAN: Persistent Atrial Fibrillation (ICD10:  I48.19) The patient's CHA2DS2-VASc score is 4, indicating a 4.8% annual risk of stroke.    Secondary Hypercoagulable State (ICD10:  D68.69) The patient is at significant risk for stroke/thromboembolism based upon her CHA2DS2-VASc Score of 4.  Start anticuagulation at the clinic visit.    Signed,  Varney Biles, MD    05/26/2020 5:13 PM     Final Clinical Impression(s) / ED Diagnoses Final diagnoses:  Diverticulitis  Atrial fibrillation with RVR ([place])    Rx / DC Orders ED Discharge Orders         Ordered    Amb referral to AFIB Clinic        05/26/20 Fox, Katurah Karapetian, MD 05/26/20 1714

## 2020-05-26 NOTE — Telephone Encounter (Signed)
Patients husband called and said that last night the patient started having diarrhea and it was black. He said since last night she has gone to the bathroom 4-5 times. Transferred to [facility].

## 2020-05-26 NOTE — ED Notes (Signed)
PA reported to RN that pt does not need 2nd lactic

## 2020-05-26 NOTE — ED Notes (Signed)
Pt able to ambulate in hall with no complaints. Pt HR range from 121 to 126 while ambulate with O2 at 92 % on 2 liters of O2. Nurse and doctor notified

## 2020-05-26 NOTE — ED Triage Notes (Signed)
Arrived via EMS; c/o lower abdominal pain that comes on when about to have a bowel movement; stated diarrhea like episodes since yesterday.

## 2020-05-28 ENCOUNTER — Other Ambulatory Visit: Payer: Self-pay | Admitting: Internal Medicine

## 2020-05-29 NOTE — Patient Instructions (Addendum)
  Medications changes include :   Augmentin twice daily.   Your prescription(s) have been submitted to your pharmacy. Please take as directed and contact our office if you believe you are having problem(s) with the medication(s).    Please followup in 6 months

## 2020-05-29 NOTE — Progress Notes (Signed)
Subjective:    Patient ID: Crystal [name], female    DOB: 01-17-1946, 74 y.o.   MRN: [id number]  HPI The patient is here for follow up of their chronic medical problems, including chronic pain due to OA ( tramadol), anxiety/insomnia, hypothyroidism, gout, COPD, ILD.  She is here with her husband.   She went to the hospital 05/26/20 with dark tarry stools, diarrhea, mild abdominal pain and was diagnosed with diverticulitis.  She was given 1 pill of Augmentin while in the emergency room and was told that something would be called into the pharmacy, but nothing was called in.  She is not taking any antibiotics since the ED.  She is still having diarrhea.  It has improved.  CT scan showed b/l moderate pleural effusions.  She was in Afib in the ED with RVR.  She sees any A. fib clinic the end of this month.  She stopped taking the lasix.   Is difficult for her to get back and forth to the bathroom and she did not really think it was helping much with her leg swelling.  She gotten to the car today to get to here and she does have difficulty getting to the car and she believes she may have scraped her leg.  It has been leaking and there is a small amount of blood.      Medications and allergies reviewed with patient and updated if appropriate.  Patient Active Problem List   Diagnosis Date Noted  . Chronic respiratory failure with hypoxia ([place]) 01/29/2020  . ILD (interstitial lung disease) ([place]) 01/29/2020  . Chronic gout 09/21/2019  . Vertigo of central origin 02/09/2019  . Other peripheral vertigo, unspecified ear 02/09/2019  . Pruritus 01/02/2019  . Hypokalemia 08/09/2018  . Long term (current) use of anticoagulants 08/01/2018  . Closed compression fracture of L4 vertebra ([place]) 07/11/2018  . Bilateral leg edema 07/11/2018  . Lung nodule < 6cm on CT 07/11/2018  . Hypothyroidism (acquired) 04/11/2018  . Fracture of femoral neck, right ([place]) 03/22/2018  . PAF (paroxysmal atrial  fibrillation) ([place]) 03/18/2018  . 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 Herpes zoster without complication 28/31/5176  . Temporal arteritis ([place]) 07/18/2017  . Vision, loss, sudden, bilateral 07/18/2017  . Osteoarthritis 06/25/2016  . Anxiety 06/25/2016  . Back pain 07/18/2015  . 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 Carotid bruit present 06/05/2010  . Osteoporosis 06/03/2008  . History of colonic polyps 06/03/2008  . INTENTION TREMOR 12/06/2007  . IBS 10/16/2007  . Hyperlipidemia 06/06/2007  . Essential hypertension 06/06/2007  . Chronic obstructive airway disease with asthma ([place]) 02/23/2007    Current Outpatient Medications on File Prior to Visit  Medication Sig Dispense Refill  . albuterol (VENTOLIN HFA) 108 (90 Base) MCG/ACT inhaler INHALE ONE PUFF BY MOUTH TWICE A DAY AS NEEDED (Patient taking differently: Inhale 1-2 puffs into the lungs every 6 (six) hours as needed for wheezing.) 8.5 g 1  . allopurinol (ZYLOPRIM) 300 MG tablet Take 1 tablet (300 mg total) by mouth daily. 90 tablet 1  . Calcium Citrate-Vitamin D (CALCIUM + D PO) Take 1 tablet by mouth daily.    . Cholecalciferol (VITAMIN D3) 1000 units CAPS Take 1 capsule (1,000 Units total) by mouth daily with lunch. (Patient taking differently: Take 1,000 Units by mouth daily.) 60 capsule 0  . diclofenac Sodium (VOLTAREN) 1 % GEL Apply 2 g topically 2 (two) times daily as needed (back pain).    Marland Kitchen diphenoxylate-atropine (LOMOTIL) 2.5-0.025  MG tablet TAKE ONE TABLET BY MOUTH FOUR TIMES A DAY AS NEEDED FOR DIARRHEA OR LOOSE STOOLS 30 tablet 0  . furosemide (LASIX) 40 MG tablet TAKE ONE TABLET BY MOUTH DAILY 90 tablet 0  . gabapentin (NEURONTIN) 300 MG capsule Take 2 capsules (600 mg total) by mouth at bedtime. 180 capsule 1  . KLOR-CON M20 20 MEQ tablet TAKE TWO TABLETS BY MOUTH DAILY (Patient taking differently: Take 20 mEq by mouth daily.) 180 tablet 0  . levothyroxine (SYNTHROID) 75 MCG tablet Take 1 tablet (75 mcg total)  by mouth daily. 90 tablet 3  . LORazepam (ATIVAN) 1 MG tablet TAKE ONE TABLET BY MOUTH EVERY NIGHT AT BEDTIME (Patient taking differently: Take 1 mg by mouth at bedtime.) 30 tablet 2  . methocarbamol (ROBAXIN) 500 MG tablet TAKE ONE TABLET BY MOUTH EVERY 6 HOURS AS NEEDED FOR MUSCLE SPASMS (Patient taking differently: Take 500 mg by mouth every 6 (six) hours as needed for muscle spasms.) 120 tablet 2  . montelukast (SINGULAIR) 10 MG tablet TAKE ONE TABLET BY MOUTH EVERY NIGHT AT BEDTIME (Patient taking differently: Take 10 mg by mouth at bedtime.) 90 tablet 0  . predniSONE (DELTASONE) 5 MG tablet Take 5 mg by mouth daily.    . SYMBICORT 160-4.5 MCG/ACT inhaler INHALE 1 TO 2 PUFFS BY MOUTH EVERY 12 HOURS THEN GARGLE AND SPIT AFTER USE (Patient taking differently: Inhale 2 puffs into the lungs 2 times daily at 12 noon and 4 pm.) 10.2 g 9  . theophylline (UNIPHYL) 400 MG 24 hr tablet TAKE 1/2 TABLET BY MOUTH TWO TIMES A DAY (Patient taking differently: Take 200 mg by mouth in the morning and at bedtime.) 90 tablet 1  . traMADol (ULTRAM) 50 MG tablet TAKE ONE TABLET BY MOUTH EVERY 8 HOURS AS NEEDED FOR MODERATE PAIN (Patient taking differently: Take 50 mg by mouth 3 (three) times daily.) 90 tablet 0  . diltiazem (CARDIZEM CD) 180 MG 24 hr capsule Take 1 capsule (180 mg total) by mouth daily. 90 capsule 3   No current facility-administered medications on file prior to visit.    Past Medical History:  Diagnosis Date  . Adenomatous colon polyp 2007 & 2010    Dr [name]  . Allergy    seasonal  . Arthritis   . Asthma   . Cataract   . Diverticulosis   . 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 Hyperlipidemia   . Hypertension   . IBS (irritable bowel syndrome)   . Legally blind   . MVP (mitral valve prolapse)   . Paroxysmal atrial fibrillation with rapid ventricular response ([place]) 08/29/2017  . Peripheral vascular disease ([place])   . RAD (reactive airway disease)     Past Surgical  History:  Procedure Laterality Date  . ABDOMINAL HYSTERECTOMY     with USO for dysfunctionall menses  . APPENDECTOMY    . ARTERY BIOPSY Bilateral 07/21/2017   Procedure: BIOPSY BILATERAL TEMPORAL ARTERY;  Surgeon: [name], MD;  Location: [facility];  Service: Vascular;  Laterality: Bilateral;  . CARDIOVERSION N/A 06/16/2018   Procedure: CARDIOVERSION;  Surgeon: [name], MD;  Location: [facility];  Service: Cardiovascular;  Laterality: N/A;  . CATARACT EXTRACTION W/ INTRAOCULAR LENS IMPLANT  2013   bilateral; Dr [name]  . COLONOSCOPY  2013   negative , due 2018; Dr [name]  . COLONOSCOPY W/ POLYPECTOMY      X2 ; diverticulosis  . EMBOLECTOMY Right 08/29/2017   Procedure: Right Groin  Exploration  ;  Surgeon: Rosetta Posner, MD;  [facility];  Service: Vascular;  Laterality: Right;  . HIP PINNING,CANNULATED Right 03/21/2018  . HIP PINNING,CANNULATED Right 03/21/2018   Procedure: CANNULATED HIP PINNING;  Surgeon: Rod Can, MD;  Location: [facility];  Service: Orthopedics;  Laterality: Right;  . LUMBAR LAMINECTOMY  2000   Dr Vertell Limber  . SHOULDER SURGERY     R shoulder  . TONSILLECTOMY      Social History   Socioeconomic History  . Marital status: Married    Spouse name: Not on file  . Number of children: Not on file  . Years of education: Not on file  . Highest education level: Not on file  Occupational History  . Occupation: retired  Tobacco Use  . Smoking status: Former Smoker    Packs/day: 1.00    Years: 23.00    Pack years: 23.00    Quit date: 06/14/1989    Years since quitting: 30.9  . Smokeless tobacco: Never Used  . Tobacco comment: smoked 1961-1991 , up to < 1 ppd  Vaping Use  . Vaping Use: Never used  Substance and Sexual Activity  . Alcohol use: Yes    Alcohol/week: 14.0 standard drinks    Types: 14 Glasses of wine per week    Comment: Wine  . Drug use: No  . Sexual activity: Not on file  Other Topics Concern  . Not on file  Social History  Narrative  . Not on file   Social Determinants of Health   Financial Resource Strain: Low Risk   . Difficulty of Paying Living Expenses: Not hard at all  Food Insecurity: Not on file  Transportation Needs: Not on file  Physical Activity: Not on file  Stress: Not on file  Social Connections: Not on file    Family History  Problem Relation Age of Onset  . Heart attack Brother 86  . Parkinsonism Mother   . Stroke Mother   . Heart attack Father        pre 30  . Diabetes Sister   . COPD Sister        X 3  . Other Son        suicide 2016  . Colon cancer Neg Hx   . Cancer Neg Hx   . Esophageal cancer Neg Hx   . Liver cancer Neg Hx   . Pancreatic cancer Neg Hx   . Rectal cancer Neg Hx   . Stomach cancer Neg Hx     Review of Systems  Constitutional: Negative for fever.  Respiratory: Positive for cough (at night, sometimes during day) and shortness of breath. Negative for wheezing.   Cardiovascular: Positive for leg swelling. Negative for chest pain and palpitations.  Neurological: Negative for light-headedness and headaches.       Objective:   Vitals:   05/30/20 1123  BP: (!) 142/64  Pulse: (!) 116  Temp: 98.4 F (36.9 C)  SpO2: (!) 82%   BP Readings from Last 3 Encounters:  05/30/20 (!) 142/64  05/26/20 124/70  04/09/20 (!) 142/70   Wt Readings from Last 3 Encounters:  05/26/20 120 lb (54.4 kg)  02/06/20 128 lb (58.1 kg)  01/24/20 130 lb 3.2 oz (59.1 kg)   Body mass index is 21.26 kg/m.   Physical Exam    Constitutional: Appears well-developed and well-nourished. No distress.  HENT:  Head: Normocephalic and atraumatic.  Neck: Neck supple. No tracheal deviation present. No thyromegaly present.  No cervical lymphadenopathy Cardiovascular: Tachycardic, but regular rhythm and normal heart sounds.   No murmur heard. .  2+ pitting left lower extremity edema with an approximate 4 mm cut in the anterior lateral leg that is oozing serosanguineous fluid.  1+  pitting right lower extremity edema  Pulmonary/Chest: Effort normal and breath sounds normal. No respiratory distress. No has no wheezes. No rales.  Abdomen: soft, minimally tender lower abdomen without rebound or guarding, ND Skin: Skin is warm and dry. Not diaphoretic.  Psychiatric: Normal mood and affect. Behavior is normal.    She did have a significant amount of serosanguineous discharge from her left lower extremity cut.  Wound was dressed.  Assessment & Plan:    See Problem List for Assessment and Plan of chronic medical problems.    This visit occurred during the SARS-CoV-2 public health emergency.  Safety protocols were in place, including screening questions prior to the visit, additional usage of staff PPE, and extensive cleaning of exam room while observing appropriate contact time as indicated for disinfecting solutions.

## 2020-05-30 ENCOUNTER — Other Ambulatory Visit: Payer: Self-pay

## 2020-05-30 ENCOUNTER — Ambulatory Visit (INDEPENDENT_AMBULATORY_CARE_PROVIDER_SITE_OTHER): Payer: Medicare Other | Admitting: Internal Medicine

## 2020-05-30 ENCOUNTER — Encounter: Payer: Self-pay | Admitting: Internal Medicine

## 2020-05-30 VITALS — BP 142/64 | HR 116 | Temp 98.4°F | Ht 63.0 in

## 2020-05-30 DIAGNOSIS — J90 Pleural effusion, not elsewhere classified: Secondary | ICD-10-CM

## 2020-05-30 DIAGNOSIS — K5792 Diverticulitis of intestine, part unspecified, without perforation or abscess without bleeding: Secondary | ICD-10-CM | POA: Insufficient documentation

## 2020-05-30 DIAGNOSIS — S81812A Laceration without foreign body, left lower leg, initial encounter: Secondary | ICD-10-CM

## 2020-05-30 DIAGNOSIS — M159 Polyosteoarthritis, unspecified: Secondary | ICD-10-CM

## 2020-05-30 DIAGNOSIS — M8949 Other hypertrophic osteoarthropathy, multiple sites: Secondary | ICD-10-CM | POA: Diagnosis not present

## 2020-05-30 DIAGNOSIS — M150 Primary generalized (osteo)arthritis: Secondary | ICD-10-CM

## 2020-05-30 DIAGNOSIS — I10 Essential (primary) hypertension: Secondary | ICD-10-CM

## 2020-05-30 DIAGNOSIS — M1A9XX0 Chronic gout, unspecified, without tophus (tophi): Secondary | ICD-10-CM

## 2020-05-30 DIAGNOSIS — R600 Localized edema: Secondary | ICD-10-CM | POA: Diagnosis not present

## 2020-05-30 DIAGNOSIS — E039 Hypothyroidism, unspecified: Secondary | ICD-10-CM

## 2020-05-30 DIAGNOSIS — I480 Paroxysmal atrial fibrillation: Secondary | ICD-10-CM

## 2020-05-30 MED ORDER — AMOXICILLIN-POT CLAVULANATE 875-125 MG PO TABS: 1.0000 | ORAL_TABLET | Freq: Two times a day (BID) | ORAL | 0 refills | Status: DC

## 2020-05-30 NOTE — Assessment & Plan Note (Signed)
Acute Diagnosed in the emergency room Was given 1 dose of Augmentin in the ED, but not prescribed antibiotic Still having diarrhea although it has improved Start Augmentin 875-125 mg twice daily x10 days Advised to not take any Imodium or Pepto-Bismol-diarrhea should improve over the next few days

## 2020-05-30 NOTE — Assessment & Plan Note (Signed)
Chronic Severe osteoarthritis-diffuse Taking Tylenol as needed Taking tramadol 1 tab every 8 hours as needed for more moderate pain-taking appropriately and the medication is helpful We will continue above

## 2020-05-30 NOTE — Assessment & Plan Note (Signed)
Acute Bilateral Seen on recent CT scan She did stop taking her Lasix and this could be related to fluid overload, A. fib, possible heart failure Advised her to take the Lasix on a daily basis Has upcoming cardiology appointment

## 2020-05-30 NOTE — Assessment & Plan Note (Signed)
Chronic She has significant bilateral lower extremity edema She will restart the Lasix and take more regularly-40 mg daily

## 2020-05-30 NOTE — Assessment & Plan Note (Signed)
Chronic Controlled with allopurinol 300 mg daily Continue

## 2020-05-30 NOTE — Assessment & Plan Note (Signed)
Acute Occurred on her way here today while getting in the car Laceration is approximately 4 mm in size, but there is significant oozing of serosanguineous fluid Wound dressed and advised this may continue to leak because of her leg edema Advised to start Lasix daily

## 2020-05-30 NOTE — Assessment & Plan Note (Signed)
Chronic Has been back in A. fib since the ED She does have a follow-up with the A. fib clinic Advised her to restart Eliquis due to increased stroke risk Continue diltiazem at current dose

## 2020-05-30 NOTE — Assessment & Plan Note (Signed)
Chronic ?Continue levothyroxine 75 mcg daily ?

## 2020-05-30 NOTE — Assessment & Plan Note (Signed)
Chronic Blood pressure very acceptable Continue diltiazem 180 mg daily

## 2020-05-31 LAB — URINE CULTURE: Culture: 1000 — AB

## 2020-05-31 LAB — CULTURE, BLOOD (ROUTINE X 2)
Culture: NO GROWTH
Culture: NO GROWTH
Special Requests: ADEQUATE

## 2020-06-01 ENCOUNTER — Telehealth: Payer: Self-pay | Admitting: Emergency Medicine

## 2020-06-01 NOTE — Telephone Encounter (Signed)
Post ED Visit - Positive Culture Follow-up  Culture report reviewed by antimicrobial stewardship pharmacist: [facility] []  Elenor Quinones, Pharm.D. []  Heide Guile, Pharm.D., BCPS AQ-ID []  Parks Neptune, Pharm.D., BCPS []  Alycia Rossetti, Pharm.D., BCPS []  [place], Pharm.D., BCPS, AAHIVP []  Legrand Como, Pharm.D., BCPS, AAHIVP []  Salome Arnt, PharmD, BCPS []  Johnnette Gourd, PharmD, BCPS []  Hughes Better, PharmD, BCPS [x]  Duanne Limerick, PharmD []  Laqueta Linden, PharmD, BCPS []  Albertina Parr, PharmD  [place] [facility] []  Leodis Sias, PharmD []  Lindell Spar, PharmD []  Royetta Asal, PharmD []  Graylin Shiver, Rph []  Rema Fendt) Glennon Mac, PharmD []  Arlyn Dunning, PharmD []  Netta Cedars, PharmD []  Dia Sitter, PharmD []  Leone Haven, PharmD []  Gretta Arab, PharmD []  Theodis Shove, PharmD []  Peggyann Juba, PharmD []  Reuel Boom, PharmD   Positive urine culture No further patient follow-up is required at this time. Providence Lanius PA  Milus Mallick 06/01/2020, 6:37 PM

## 2020-06-02 ENCOUNTER — Ambulatory Visit (HOSPITAL_COMMUNITY): Payer: Medicare Other | Admitting: Nurse Practitioner

## 2020-06-12 ENCOUNTER — Ambulatory Visit (HOSPITAL_COMMUNITY): Payer: Medicare Other | Admitting: Nurse Practitioner

## 2020-06-16 ENCOUNTER — Telehealth: Payer: Self-pay | Admitting: Internal Medicine

## 2020-06-16 MED ORDER — DIPHENOXYLATE-ATROPINE 2.5-0.025 MG PO TABS: ORAL_TABLET | ORAL | 0 refills | Status: AC

## 2020-06-16 MED ORDER — ONDANSETRON HCL 4 MG PO TABS: 4.0000 mg | ORAL_TABLET | Freq: Three times a day (TID) | ORAL | 0 refills | Status: AC | PRN

## 2020-06-16 NOTE — Telephone Encounter (Signed)
A medication for nausea and one for diarrhea sent to pharmacy

## 2020-06-16 NOTE — Telephone Encounter (Signed)
Patient states her diarrhea and vomiting is back and is asking if Dr. Lawerance Bach can send something in for her.  Patient denies appointment at this time  [facility] [address], [place] - [address] Phone:  [phone]  Fax:  [phone]     [phone]

## 2020-06-16 NOTE — Telephone Encounter (Signed)
Husband picked up script already.

## 2020-06-17 ENCOUNTER — Emergency Department (HOSPITAL_COMMUNITY)
Admission: EM | Admit: 2020-06-17 | Discharge: 2020-06-17 | Disposition: A | Discharge status: Home / Self Care | Payer: Medicare Other | Source: Home / Self Care

## 2020-06-17 ENCOUNTER — Telehealth: Payer: Self-pay | Admitting: Cardiology

## 2020-06-17 ENCOUNTER — Other Ambulatory Visit: Payer: Self-pay

## 2020-06-17 ENCOUNTER — Emergency Department (HOSPITAL_COMMUNITY): Payer: Medicare Other

## 2020-06-17 DIAGNOSIS — R079 Chest pain, unspecified: Secondary | ICD-10-CM | POA: Insufficient documentation

## 2020-06-17 DIAGNOSIS — I517 Cardiomegaly: Secondary | ICD-10-CM | POA: Diagnosis not present

## 2020-06-17 DIAGNOSIS — Z5321 Procedure and treatment not carried out due to patient leaving prior to being seen by health care provider: Secondary | ICD-10-CM | POA: Insufficient documentation

## 2020-06-17 DIAGNOSIS — J90 Pleural effusion, not elsewhere classified: Secondary | ICD-10-CM | POA: Diagnosis not present

## 2020-06-17 DIAGNOSIS — R0789 Other chest pain: Secondary | ICD-10-CM | POA: Diagnosis not present

## 2020-06-17 DIAGNOSIS — R0602 Shortness of breath: Secondary | ICD-10-CM | POA: Insufficient documentation

## 2020-06-17 DIAGNOSIS — R002 Palpitations: Secondary | ICD-10-CM | POA: Insufficient documentation

## 2020-06-17 DIAGNOSIS — I4891 Unspecified atrial fibrillation: Secondary | ICD-10-CM | POA: Diagnosis not present

## 2020-06-17 DIAGNOSIS — I4819 Other persistent atrial fibrillation: Secondary | ICD-10-CM | POA: Diagnosis not present

## 2020-06-17 DIAGNOSIS — I480 Paroxysmal atrial fibrillation: Secondary | ICD-10-CM | POA: Diagnosis not present

## 2020-06-17 DIAGNOSIS — A419 Sepsis, unspecified organism: Secondary | ICD-10-CM | POA: Diagnosis not present

## 2020-06-17 LAB — TROPONIN I (HIGH SENSITIVITY): Troponin I (High Sensitivity): 38 ng/L — ABNORMAL HIGH (ref <18)

## 2020-06-17 LAB — BASIC METABOLIC PANEL
Anion gap: 16 — ABNORMAL HIGH (ref 5–15)
BUN: 12 mg/dL (ref 8–23)
CO2: 21 mmol/L — ABNORMAL LOW (ref 22–32)
Calcium: 8.5 mg/dL — ABNORMAL LOW (ref 8.9–10.3)
Chloride: 94 mmol/L — ABNORMAL LOW (ref 98–111)
Creatinine, Ser: 1.26 mg/dL — ABNORMAL HIGH (ref 0.44–1.00)
GFR, Estimated: 44 mL/min — ABNORMAL LOW (ref >60)
Glucose, Bld: 137 mg/dL — ABNORMAL HIGH (ref 70–99)
Potassium: 3.3 mmol/L — ABNORMAL LOW (ref 3.5–5.1)
Sodium: 131 mmol/L — ABNORMAL LOW (ref 135–145)

## 2020-06-17 LAB — CBC
HCT: 37.2 % (ref 36.0–46.0)
Hemoglobin: 11.1 g/dL — ABNORMAL LOW (ref 12.0–15.0)
MCH: 25.2 pg — ABNORMAL LOW (ref 26.0–34.0)
MCHC: 29.8 g/dL — ABNORMAL LOW (ref 30.0–36.0)
MCV: 84.5 fL (ref 80.0–100.0)
Platelets: 687 10*3/uL — ABNORMAL HIGH (ref 150–400)
RBC: 4.4 MIL/uL (ref 3.87–5.11)
RDW: 21.5 % — ABNORMAL HIGH (ref 11.5–15.5)
WBC: 15.6 10*3/uL — ABNORMAL HIGH (ref 4.0–10.5)
nRBC: 0.1 % (ref 0.0–0.2)

## 2020-06-17 NOTE — ED Notes (Signed)
Pt's husband states that pt would like to leave. This RN advised that all patients stay to be seen, but stated that pt could come back at any time to be re-evaluated. Pt to be wheeled out by staff to husband's car

## 2020-06-17 NOTE — Telephone Encounter (Signed)
Agree with fuov Crystal [name]  

## 2020-06-17 NOTE — Telephone Encounter (Signed)
Patient's husband calling back. States he has been giving her his metoprolol to help her HR stay low. He has given her 2 or 3 and they are 25mg  each. He wants to know if Dr. can write her a prescription for metoprolol.

## 2020-06-17 NOTE — Telephone Encounter (Signed)
Patient c/o Palpitations:  High priority if patient c/o lightheadedness, shortness of breath, or chest pain  1) How long have you had palpitations/irregular HR/ Afib? Are you having the symptoms now? Yes- 2 to 3 weeks  2) Are you currently experiencing lightheadedness, SOB or CP? no  3) Do you have a history of afib (atrial fibrillation) or irregular heart rhythm?  History of afib  4) Have you checked your BP or HR? (document readings if available):  Have not checked  5) Are you experiencing any other symptoms? No - pt wants to be seen

## 2020-06-17 NOTE — Telephone Encounter (Signed)
Spoke with pt's husband, Leonette Most (ok per DPR), regarding pt's heart rate and a-fib.  Pt's husband states that her heart rate was checked with a pulse ox and that her it was up to 145bpm. Husband states that he gave her one of his 25mg  metoprolol tablets and that brought her heart rate down to around 100bpm, he states that he then gave her one additional 25mg  metoprolol tablet. He says that she is doing much better now.  Husband checked pulse ox while on the phone, current HR is 70-72bpm and oxygen is 97%.  Pt's husband requesting a sooner appointment.  Appointment made for Thursday, 1/6 at 9:40am. Advised pt's husband to not give his medication to his wife but to wait until Thursday to discuss this medication with Dr. Sunday.

## 2020-06-17 NOTE — ED Triage Notes (Signed)
Arrived via EMS; c/o chest pain and palpitations along w/ shortness of breath. EMS endorsed low BP reads and that patient was given metoprolol 25 mg tab x 2 by family to help with palpitations, the medication is not her own. Reported hx of afib and is legally blind.

## 2020-06-17 NOTE — Progress Notes (Deleted)
HPI: FU atrial fibrillation. 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 Patient had a right buttock subcutaneous hematoma September 2019. Patient fell October 2019 and had a subcapital fracture of the right hip. She was in atrial fibrillation with rapid ventricular response at that time. She underwent repair of her hip fracture and was placed on amiodarone.Had cardioversion June 16, 2018.Carotid Dopplers March 2020 showed no stenosis on the right and 1 to 39% stenosis on the left.Seen for recurrent atrial fibrillation 1/21 and started back on amiodarone.Chest CT 10/21 showed mild pulmonary fibrosis and coronary calcification.  Contacted the office recently for elevated HR and added to my schedule. Since last seen,   Current Outpatient Medications  Medication Sig Dispense Refill  . albuterol (VENTOLIN HFA) 108 (90 Base) MCG/ACT inhaler INHALE ONE PUFF BY MOUTH TWICE A DAY AS NEEDED (Patient taking differently: Inhale 1-2 puffs into the lungs every 6 (six) hours as needed for wheezing.) 8.5 g 1  . allopurinol (ZYLOPRIM) 300 MG tablet Take 1 tablet (300 mg total) by mouth daily. 90 tablet 1  . amoxicillin-clavulanate (AUGMENTIN) 875-125 MG tablet Take 1 tablet by mouth 2 (two) times daily. 20 tablet 0  . Calcium Citrate-Vitamin D (CALCIUM + D PO) Take 1 tablet by mouth daily.    . Cholecalciferol (VITAMIN  D3) 1000 units CAPS Take 1 capsule (1,000 Units total) by mouth daily with lunch. (Patient taking differently: Take 1,000 Units by mouth daily.) 60 capsule 0  . diclofenac Sodium (VOLTAREN) 1 % GEL Apply 2 g topically 2 (two) times daily as needed (back pain).    Marland Kitchen diltiazem (CARDIZEM CD) 180 MG 24 hr capsule Take 1 capsule (180 mg total) by mouth daily. 90 capsule 3  . diphenoxylate-atropine (LOMOTIL) 2.5-0.025 MG tablet TAKE ONE TABLET BY MOUTH FOUR TIMES A DAY AS NEEDED FOR DIARRHEA OR LOOSE STOOLS 30 tablet 0  . furosemide (LASIX) 40 MG tablet TAKE ONE TABLET BY MOUTH DAILY 90 tablet 0  . gabapentin (NEURONTIN) 300 MG capsule Take 2 capsules (600 mg total) by mouth at bedtime. 180 capsule 1  . KLOR-CON M20 20 MEQ tablet TAKE TWO TABLETS BY MOUTH DAILY (Patient taking differently: Take 20 mEq by mouth daily.) 180 tablet 0  . levothyroxine (SYNTHROID) 75 MCG tablet Take 1 tablet (75 mcg total) by mouth daily. 90 tablet 3  . LORazepam (ATIVAN) 1 MG tablet TAKE ONE TABLET BY MOUTH EVERY NIGHT AT BEDTIME (Patient taking differently: Take 1 mg by mouth at bedtime.) 30 tablet 2  . methocarbamol (ROBAXIN) 500 MG tablet TAKE ONE TABLET BY MOUTH EVERY 6 HOURS AS NEEDED FOR MUSCLE SPASMS (Patient taking differently: Take 500 mg by mouth every 6 (six) hours as needed for muscle spasms.) 120 tablet 2  . montelukast (SINGULAIR) 10 MG tablet TAKE ONE TABLET BY MOUTH EVERY NIGHT AT BEDTIME (Patient taking differently: Take 10 mg by mouth at bedtime.) 90 tablet 0  . ondansetron (ZOFRAN) 4 MG tablet Take 1 tablet (4  mg total) by mouth every 8 (eight) hours as needed for nausea or vomiting. 20 tablet 0  . predniSONE (DELTASONE) 5 MG tablet Take 5 mg by mouth daily.    . SYMBICORT 160-4.5 MCG/ACT inhaler INHALE 1 TO 2 PUFFS BY MOUTH EVERY 12 HOURS THEN GARGLE AND SPIT AFTER USE (Patient taking differently: Inhale 2 puffs into the lungs 2 times daily at 12 noon and 4 pm.) 10.2 g 9  . theophylline (UNIPHYL) 400 MG 24 hr  tablet TAKE 1/2 TABLET BY MOUTH TWO TIMES A DAY (Patient taking differently: Take 200 mg by mouth in the morning and at bedtime.) 90 tablet 1  . traMADol (ULTRAM) 50 MG tablet TAKE ONE TABLET BY MOUTH EVERY 8 HOURS AS NEEDED FOR MODERATE PAIN (Patient taking differently: Take 50 mg by mouth 3 (three) times daily.) 90 tablet 0   No current facility-administered medications for this visit.     Past Medical History:  Diagnosis Date  . Adenomatous colon polyp 2007 & 2010    Dr [name]  . Allergy    seasonal  . Arthritis   . Asthma   . Cataract   . Diverticulosis   . 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 Hyperlipidemia   . Hypertension   . IBS (irritable bowel syndrome)   . Legally blind   . MVP (mitral valve prolapse)   . Paroxysmal atrial fibrillation with rapid ventricular response ([place]) 08/29/2017  . Peripheral vascular disease ([place])   . RAD (reactive airway disease)     Past Surgical History:  Procedure Laterality Date  . ABDOMINAL HYSTERECTOMY     with USO for dysfunctionall menses  . APPENDECTOMY    . ARTERY BIOPSY Bilateral 07/21/2017   Procedure: BIOPSY BILATERAL TEMPORAL ARTERY;  Surgeon: [name], MD;  Location: [facility];  Service: Vascular;  Laterality: Bilateral;  . CARDIOVERSION N/A 06/16/2018   Procedure: CARDIOVERSION;  Surgeon: [name], MD;  Location: [facility];  Service: Cardiovascular;  Laterality: N/A;  . CATARACT EXTRACTION W/ INTRAOCULAR LENS IMPLANT  2013   bilateral; Dr [name]  . COLONOSCOPY  2013   negative , due 2018; Dr [name]  . COLONOSCOPY W/ POLYPECTOMY      X2 ; diverticulosis  . EMBOLECTOMY Right 08/29/2017   Procedure: Right Groin Exploration  ;  Surgeon: Rosetta Posner, MD;  Location: [facility];  Service: Vascular;  Laterality: Right;  . HIP PINNING,CANNULATED Right 03/21/2018  . HIP PINNING,CANNULATED Right 03/21/2018   Procedure: CANNULATED HIP PINNING;  Surgeon: Rod Can, MD;  Location: [facility];  Service:  Orthopedics;  Laterality: Right;  . LUMBAR LAMINECTOMY  2000   Dr Vertell Limber  . SHOULDER SURGERY     R shoulder  . TONSILLECTOMY      Social History   Socioeconomic History  . Marital status: Married    Spouse name: Not on file  . Number of children: Not on file  . Years of education: Not on file  . Highest education level: Not on file  Occupational History  . Occupation: retired  Tobacco Use  . Smoking status: Former Smoker    Packs/day: 1.00    Years: 23.00    Pack years: 23.00    Quit date: 06/14/1989    Years since quitting: 31.0  . Smokeless tobacco: Never Used  . Tobacco comment: smoked 1961-1991 , up to < 1 ppd  Vaping Use  . Vaping Use: Never used  Substance and Sexual Activity  . Alcohol use: Yes  Alcohol/week: 14.0 standard drinks    Types: 14 Glasses of wine per week    Comment: Wine  . Drug use: No  . Sexual activity: Not on file  Other Topics Concern  . Not on file  Social History Narrative  . Not on file   Social Determinants of Health   Financial Resource Strain: Low Risk   . Difficulty of Paying Living Expenses: Not hard at all  Food Insecurity: Not on file  Transportation Needs: Not on file  Physical Activity: Not on file  Stress: Not on file  Social Connections: Not on file  Intimate Partner Violence: Not on file    Family History  Problem Relation Age of Onset  . Heart attack Brother 49  . Parkinsonism Mother   . Stroke Mother   . Heart attack Father        pre 42  . Diabetes Sister   . COPD Sister        X 3  . Other Son        suicide 2016  . Colon cancer Neg Hx   . Cancer Neg Hx   . Esophageal cancer Neg Hx   . Liver cancer Neg Hx   . Pancreatic cancer Neg Hx   . Rectal cancer Neg Hx   . Stomach cancer Neg Hx     ROS: no fevers or chills, productive cough, hemoptysis, dysphasia, odynophagia, melena, hematochezia, dysuria, hematuria, rash, seizure activity, orthopnea, PND, pedal edema, claudication. Remaining systems are  negative.  Physical Exam: Well-developed well-nourished in no acute distress.  Skin is warm and dry.  HEENT is normal.  Neck is supple.  Chest is clear to auscultation with normal expansion.  Cardiovascular exam is regular rate and rhythm.  Abdominal exam nontender or distended. No masses palpated. Extremities show no edema. neuro grossly intact  ECG- 05/26/20-atrial flutter, CRO anterior MI; personally reviewed  A/P  1 PAF/atrial flutter- Given previous CT results, I DCed amiodarone (? Contributing to pulmonary disease); she is now back in atrial fibrillation. Flecanide likely not an option given coronary calcification noted on CT. Will refer to a fib clinic for consideration of different antiarrhythmic.   2 hypertension-BP controlled; continue present meds.  3 hyperlipidemia-continue statin,  4 PVD-H/O SFA occlusion; no claudication; continue medical therapy.  5 h/o giant cell arteritis  Olga Millers, MD

## 2020-06-18 ENCOUNTER — Telehealth: Payer: Self-pay

## 2020-06-18 ENCOUNTER — Telehealth (INDEPENDENT_AMBULATORY_CARE_PROVIDER_SITE_OTHER): Payer: Medicare Other | Admitting: Internal Medicine

## 2020-06-18 ENCOUNTER — Inpatient Hospital Stay (HOSPITAL_COMMUNITY)
Admission: EM | Admit: 2020-06-18 | Discharge: 2020-07-15 | DRG: 308 | Disposition: E | Payer: Medicare Other | Attending: Family Medicine | Admitting: Family Medicine

## 2020-06-18 ENCOUNTER — Emergency Department (HOSPITAL_COMMUNITY): Payer: Medicare Other

## 2020-06-18 ENCOUNTER — Encounter: Payer: Self-pay | Admitting: Internal Medicine

## 2020-06-18 DIAGNOSIS — Z0189 Encounter for other specified special examinations: Secondary | ICD-10-CM

## 2020-06-18 DIAGNOSIS — Q211 Atrial septal defect: Secondary | ICD-10-CM

## 2020-06-18 DIAGNOSIS — K589 Irritable bowel syndrome without diarrhea: Secondary | ICD-10-CM | POA: Diagnosis present

## 2020-06-18 DIAGNOSIS — E860 Dehydration: Secondary | ICD-10-CM | POA: Diagnosis present

## 2020-06-18 DIAGNOSIS — I462 Cardiac arrest due to underlying cardiac condition: Secondary | ICD-10-CM | POA: Diagnosis not present

## 2020-06-18 DIAGNOSIS — E039 Hypothyroidism, unspecified: Secondary | ICD-10-CM | POA: Diagnosis present

## 2020-06-18 DIAGNOSIS — I472 Ventricular tachycardia: Secondary | ICD-10-CM | POA: Diagnosis not present

## 2020-06-18 DIAGNOSIS — Z01818 Encounter for other preprocedural examination: Secondary | ICD-10-CM

## 2020-06-18 DIAGNOSIS — R778 Other specified abnormalities of plasma proteins: Secondary | ICD-10-CM | POA: Insufficient documentation

## 2020-06-18 DIAGNOSIS — J939 Pneumothorax, unspecified: Secondary | ICD-10-CM | POA: Diagnosis not present

## 2020-06-18 DIAGNOSIS — I110 Hypertensive heart disease with heart failure: Secondary | ICD-10-CM | POA: Diagnosis present

## 2020-06-18 DIAGNOSIS — I4891 Unspecified atrial fibrillation: Secondary | ICD-10-CM | POA: Diagnosis not present

## 2020-06-18 DIAGNOSIS — M25511 Pain in right shoulder: Secondary | ICD-10-CM | POA: Diagnosis present

## 2020-06-18 DIAGNOSIS — J84112 Idiopathic pulmonary fibrosis: Secondary | ICD-10-CM | POA: Diagnosis present

## 2020-06-18 DIAGNOSIS — I480 Paroxysmal atrial fibrillation: Secondary | ICD-10-CM

## 2020-06-18 DIAGNOSIS — H548 Legal blindness, as defined in USA: Secondary | ICD-10-CM | POA: Diagnosis present

## 2020-06-18 DIAGNOSIS — R570 Cardiogenic shock: Secondary | ICD-10-CM | POA: Diagnosis not present

## 2020-06-18 DIAGNOSIS — Z79890 Hormone replacement therapy (postmenopausal): Secondary | ICD-10-CM

## 2020-06-18 DIAGNOSIS — E876 Hypokalemia: Secondary | ICD-10-CM | POA: Diagnosis present

## 2020-06-18 DIAGNOSIS — I4430 Unspecified atrioventricular block: Secondary | ICD-10-CM | POA: Diagnosis present

## 2020-06-18 DIAGNOSIS — J90 Pleural effusion, not elsewhere classified: Secondary | ICD-10-CM | POA: Diagnosis not present

## 2020-06-18 DIAGNOSIS — J9621 Acute and chronic respiratory failure with hypoxia: Secondary | ICD-10-CM | POA: Diagnosis not present

## 2020-06-18 DIAGNOSIS — N179 Acute kidney failure, unspecified: Secondary | ICD-10-CM | POA: Diagnosis not present

## 2020-06-18 DIAGNOSIS — J449 Chronic obstructive pulmonary disease, unspecified: Secondary | ICD-10-CM | POA: Diagnosis present

## 2020-06-18 DIAGNOSIS — I509 Heart failure, unspecified: Secondary | ICD-10-CM

## 2020-06-18 DIAGNOSIS — D72829 Elevated white blood cell count, unspecified: Secondary | ICD-10-CM | POA: Diagnosis not present

## 2020-06-18 DIAGNOSIS — I484 Atypical atrial flutter: Secondary | ICD-10-CM | POA: Diagnosis present

## 2020-06-18 DIAGNOSIS — Z90710 Acquired absence of both cervix and uterus: Secondary | ICD-10-CM

## 2020-06-18 DIAGNOSIS — Z20822 Contact with and (suspected) exposure to covid-19: Secondary | ICD-10-CM | POA: Diagnosis present

## 2020-06-18 DIAGNOSIS — R079 Chest pain, unspecified: Secondary | ICD-10-CM | POA: Diagnosis not present

## 2020-06-18 DIAGNOSIS — M1990 Unspecified osteoarthritis, unspecified site: Secondary | ICD-10-CM | POA: Diagnosis present

## 2020-06-18 DIAGNOSIS — M316 Other giant cell arteritis: Secondary | ICD-10-CM | POA: Diagnosis present

## 2020-06-18 DIAGNOSIS — Z820 Family history of epilepsy and other diseases of the nervous system: Secondary | ICD-10-CM

## 2020-06-18 DIAGNOSIS — Z8249 Family history of ischemic heart disease and other diseases of the circulatory system: Secondary | ICD-10-CM

## 2020-06-18 DIAGNOSIS — A419 Sepsis, unspecified organism: Secondary | ICD-10-CM | POA: Diagnosis not present

## 2020-06-18 DIAGNOSIS — Z7951 Long term (current) use of inhaled steroids: Secondary | ICD-10-CM

## 2020-06-18 DIAGNOSIS — I4819 Other persistent atrial fibrillation: Principal | ICD-10-CM | POA: Diagnosis present

## 2020-06-18 DIAGNOSIS — Z833 Family history of diabetes mellitus: Secondary | ICD-10-CM

## 2020-06-18 DIAGNOSIS — E785 Hyperlipidemia, unspecified: Secondary | ICD-10-CM | POA: Diagnosis present

## 2020-06-18 DIAGNOSIS — J9811 Atelectasis: Secondary | ICD-10-CM | POA: Diagnosis present

## 2020-06-18 DIAGNOSIS — Z9981 Dependence on supplemental oxygen: Secondary | ICD-10-CM

## 2020-06-18 DIAGNOSIS — Z825 Family history of asthma and other chronic lower respiratory diseases: Secondary | ICD-10-CM

## 2020-06-18 DIAGNOSIS — I272 Pulmonary hypertension, unspecified: Secondary | ICD-10-CM | POA: Diagnosis present

## 2020-06-18 DIAGNOSIS — I071 Rheumatic tricuspid insufficiency: Secondary | ICD-10-CM | POA: Diagnosis present

## 2020-06-18 DIAGNOSIS — I248 Other forms of acute ischemic heart disease: Secondary | ICD-10-CM | POA: Diagnosis present

## 2020-06-18 DIAGNOSIS — Z87891 Personal history of nicotine dependence: Secondary | ICD-10-CM

## 2020-06-18 DIAGNOSIS — I4901 Ventricular fibrillation: Secondary | ICD-10-CM | POA: Diagnosis not present

## 2020-06-18 DIAGNOSIS — I70201 Unspecified atherosclerosis of native arteries of extremities, right leg: Secondary | ICD-10-CM | POA: Diagnosis present

## 2020-06-18 DIAGNOSIS — Z823 Family history of stroke: Secondary | ICD-10-CM

## 2020-06-18 DIAGNOSIS — Z888 Allergy status to other drugs, medicaments and biological substances status: Secondary | ICD-10-CM

## 2020-06-18 DIAGNOSIS — Z66 Do not resuscitate: Secondary | ICD-10-CM | POA: Diagnosis present

## 2020-06-18 DIAGNOSIS — I4581 Long QT syndrome: Secondary | ICD-10-CM | POA: Diagnosis not present

## 2020-06-18 DIAGNOSIS — Z8719 Personal history of other diseases of the digestive system: Secondary | ICD-10-CM

## 2020-06-18 DIAGNOSIS — Z86010 Personal history of colonic polyps: Secondary | ICD-10-CM

## 2020-06-18 DIAGNOSIS — I517 Cardiomegaly: Secondary | ICD-10-CM | POA: Diagnosis not present

## 2020-06-18 DIAGNOSIS — L989 Disorder of the skin and subcutaneous tissue, unspecified: Secondary | ICD-10-CM | POA: Diagnosis present

## 2020-06-18 DIAGNOSIS — R0789 Other chest pain: Secondary | ICD-10-CM | POA: Diagnosis not present

## 2020-06-18 DIAGNOSIS — E161 Other hypoglycemia: Secondary | ICD-10-CM | POA: Diagnosis not present

## 2020-06-18 DIAGNOSIS — E162 Hypoglycemia, unspecified: Secondary | ICD-10-CM | POA: Diagnosis not present

## 2020-06-18 DIAGNOSIS — I429 Cardiomyopathy, unspecified: Secondary | ICD-10-CM | POA: Diagnosis present

## 2020-06-18 DIAGNOSIS — Z79899 Other long term (current) drug therapy: Secondary | ICD-10-CM

## 2020-06-18 DIAGNOSIS — E871 Hypo-osmolality and hyponatremia: Secondary | ICD-10-CM | POA: Diagnosis present

## 2020-06-18 DIAGNOSIS — Z515 Encounter for palliative care: Secondary | ICD-10-CM

## 2020-06-18 DIAGNOSIS — E872 Acidosis: Secondary | ICD-10-CM | POA: Diagnosis present

## 2020-06-18 DIAGNOSIS — I5041 Acute combined systolic (congestive) and diastolic (congestive) heart failure: Secondary | ICD-10-CM | POA: Diagnosis present

## 2020-06-18 DIAGNOSIS — Z7952 Long term (current) use of systemic steroids: Secondary | ICD-10-CM

## 2020-06-18 LAB — COMPREHENSIVE METABOLIC PANEL
ALT: 16 U/L (ref 0–44)
AST: 35 U/L (ref 15–41)
Albumin: 3.4 g/dL — ABNORMAL LOW (ref 3.5–5.0)
Alkaline Phosphatase: 136 U/L — ABNORMAL HIGH (ref 38–126)
Anion gap: 16 — ABNORMAL HIGH (ref 5–15)
BUN: 15 mg/dL (ref 8–23)
CO2: 22 mmol/L (ref 22–32)
Calcium: 8.6 mg/dL — ABNORMAL LOW (ref 8.9–10.3)
Chloride: 94 mmol/L — ABNORMAL LOW (ref 98–111)
Creatinine, Ser: 1.95 mg/dL — ABNORMAL HIGH (ref 0.44–1.00)
GFR, Estimated: 26 mL/min — ABNORMAL LOW (ref >60)
Glucose, Bld: 85 mg/dL (ref 70–99)
Potassium: 3.2 mmol/L — ABNORMAL LOW (ref 3.5–5.1)
Sodium: 132 mmol/L — ABNORMAL LOW (ref 135–145)
Total Bilirubin: 1.4 mg/dL — ABNORMAL HIGH (ref 0.3–1.2)
Total Protein: 5.8 g/dL — ABNORMAL LOW (ref 6.5–8.1)

## 2020-06-18 LAB — CBC WITH DIFFERENTIAL/PLATELET
Abs Immature Granulocytes: 0.18 10*3/uL — ABNORMAL HIGH (ref 0.00–0.07)
Basophils Absolute: 0.2 10*3/uL — ABNORMAL HIGH (ref 0.0–0.1)
Basophils Relative: 1 %
Eosinophils Absolute: 0 10*3/uL (ref 0.0–0.5)
Eosinophils Relative: 0 %
HCT: 40.3 % (ref 36.0–46.0)
Hemoglobin: 11.2 g/dL — ABNORMAL LOW (ref 12.0–15.0)
Immature Granulocytes: 1 %
Lymphocytes Relative: 7 %
Lymphs Abs: 1.5 10*3/uL (ref 0.7–4.0)
MCH: 24 pg — ABNORMAL LOW (ref 26.0–34.0)
MCHC: 27.8 g/dL — ABNORMAL LOW (ref 30.0–36.0)
MCV: 86.5 fL (ref 80.0–100.0)
Monocytes Absolute: 4.2 10*3/uL — ABNORMAL HIGH (ref 0.1–1.0)
Monocytes Relative: 19 %
Neutro Abs: 16 10*3/uL — ABNORMAL HIGH (ref 1.7–7.7)
Neutrophils Relative %: 72 %
Platelets: 544 10*3/uL — ABNORMAL HIGH (ref 150–400)
RBC: 4.66 MIL/uL (ref 3.87–5.11)
RDW: 21 % — ABNORMAL HIGH (ref 11.5–15.5)
WBC: 22 10*3/uL — ABNORMAL HIGH (ref 4.0–10.5)
nRBC: 0 % (ref 0.0–0.2)

## 2020-06-18 LAB — RESP PANEL BY RT-PCR (FLU A&B, COVID) ARPGX2
Influenza A by PCR: NEGATIVE
Influenza B by PCR: NEGATIVE
SARS Coronavirus 2 by RT PCR: NEGATIVE

## 2020-06-18 LAB — TSH: TSH: 3.778 u[IU]/mL (ref 0.350–4.500)

## 2020-06-18 LAB — T4, FREE: Free T4: 1.09 ng/dL (ref 0.61–1.12)

## 2020-06-18 LAB — TROPONIN I (HIGH SENSITIVITY)
Troponin I (High Sensitivity): 42 ng/L — ABNORMAL HIGH (ref <18)
Troponin I (High Sensitivity): 53 ng/L — ABNORMAL HIGH (ref <18)

## 2020-06-18 LAB — MRSA PCR SCREENING: MRSA by PCR: NEGATIVE

## 2020-06-18 LAB — LACTIC ACID, PLASMA
Lactic Acid, Venous: 2.3 mmol/L (ref 0.5–1.9)
Lactic Acid, Venous: 2 mmol/L (ref 0.5–1.9)

## 2020-06-18 LAB — MAGNESIUM: Magnesium: 1.7 mg/dL (ref 1.7–2.4)

## 2020-06-18 LAB — HEPARIN LEVEL (UNFRACTIONATED): Heparin Unfractionated: 0.29 IU/mL — ABNORMAL LOW (ref 0.30–0.70)

## 2020-06-18 LAB — BRAIN NATRIURETIC PEPTIDE: B Natriuretic Peptide: 1162.3 pg/mL — ABNORMAL HIGH (ref 0.0–100.0)

## 2020-06-18 LAB — PROCALCITONIN: Procalcitonin: 0.21 ng/mL

## 2020-06-18 MED ORDER — LACTATED RINGERS IV BOLUS: 1000.0000 mL | Freq: Once | INTRAVENOUS | Status: DC

## 2020-06-18 MED ORDER — LORAZEPAM 1 MG PO TABS
1.0000 mg | ORAL_TABLET | Freq: Every day | ORAL | Status: DC
Start: 2020-06-18 — End: 2020-06-25
  Administered 2020-06-18 – 2020-06-24 (×7): 1 mg via ORAL
  Filled 2020-06-18 (×7): qty 1

## 2020-06-18 MED ORDER — FUROSEMIDE 10 MG/ML IJ SOLN
40.0000 mg | Freq: Every day | INTRAMUSCULAR | Status: DC
  Administered 2020-06-18 – 2020-06-22 (×5): 40 mg via INTRAVENOUS
  Filled 2020-06-18 (×5): qty 4

## 2020-06-18 MED ORDER — ONDANSETRON HCL 4 MG/2ML IJ SOLN
4.0000 mg | Freq: Four times a day (QID) | INTRAMUSCULAR | Status: DC | PRN
  Administered 2020-06-18 – 2020-06-25 (×4): 4 mg via INTRAVENOUS
  Filled 2020-06-18 (×4): qty 2

## 2020-06-18 MED ORDER — POTASSIUM CHLORIDE CRYS ER 20 MEQ PO TBCR
40.0000 meq | EXTENDED_RELEASE_TABLET | Freq: Once | ORAL | Status: AC
  Administered 2020-06-18: 40 meq via ORAL
  Filled 2020-06-18: qty 2

## 2020-06-18 MED ORDER — MAGNESIUM SULFATE 2 GM/50ML IV SOLN
2.0000 g | Freq: Once | INTRAVENOUS | Status: AC
  Administered 2020-06-18: 2 g via INTRAVENOUS
  Filled 2020-06-18: qty 50

## 2020-06-18 MED ORDER — TRAMADOL HCL 50 MG PO TABS
50.0000 mg | ORAL_TABLET | Freq: Two times a day (BID) | ORAL | Status: DC | PRN
  Administered 2020-06-18 – 2020-06-23 (×7): 50 mg via ORAL
  Filled 2020-06-18 (×7): qty 1

## 2020-06-18 MED ORDER — THEOPHYLLINE ER 400 MG PO TB24
200.0000 mg | ORAL_TABLET | Freq: Two times a day (BID) | ORAL | Status: DC
Start: 2020-06-18 — End: 2020-06-25
  Administered 2020-06-18 – 2020-06-25 (×14): 200 mg via ORAL
  Filled 2020-06-18 (×15): qty 0.5

## 2020-06-18 MED ORDER — LEVOTHYROXINE SODIUM 75 MCG PO TABS
75.0000 ug | ORAL_TABLET | Freq: Every day | ORAL | Status: DC
  Administered 2020-06-19 – 2020-06-25 (×6): 75 ug via ORAL
  Filled 2020-06-18 (×7): qty 1

## 2020-06-18 MED ORDER — SODIUM CHLORIDE 0.9 % IV BOLUS
1000.0000 mL | Freq: Once | INTRAVENOUS | Status: AC
  Administered 2020-06-18: 1000 mL via INTRAVENOUS

## 2020-06-18 MED ORDER — HEPARIN (PORCINE) 25000 UT/250ML-% IV SOLN
950.0000 [IU]/h | INTRAVENOUS | Status: DC
  Administered 2020-06-18: 800 [IU]/h via INTRAVENOUS
  Filled 2020-06-18 (×2): qty 250

## 2020-06-18 MED ORDER — GABAPENTIN 300 MG PO CAPS
600.0000 mg | ORAL_CAPSULE | Freq: Every day | ORAL | Status: DC
  Administered 2020-06-18 – 2020-06-24 (×7): 600 mg via ORAL
  Filled 2020-06-18 (×7): qty 2

## 2020-06-18 MED ORDER — DILTIAZEM HCL-DEXTROSE 125-5 MG/125ML-% IV SOLN (PREMIX)
5.0000 mg/h | INTRAVENOUS | Status: DC
  Administered 2020-06-18: 5 mg/h via INTRAVENOUS
  Administered 2020-06-19: 10 mg/h via INTRAVENOUS
  Administered 2020-06-20: 5 mg/h via INTRAVENOUS
  Filled 2020-06-18 (×4): qty 125

## 2020-06-18 MED ORDER — HEPARIN BOLUS VIA INFUSION
2500.0000 [IU] | Freq: Once | INTRAVENOUS | Status: AC
  Administered 2020-06-18: 2500 [IU] via INTRAVENOUS
  Filled 2020-06-18: qty 2500

## 2020-06-18 MED ORDER — MONTELUKAST SODIUM 10 MG PO TABS
10.0000 mg | ORAL_TABLET | Freq: Every day | ORAL | Status: DC
Start: 2020-06-18 — End: 2020-06-25
  Administered 2020-06-18 – 2020-06-24 (×7): 10 mg via ORAL
  Filled 2020-06-18 (×8): qty 1

## 2020-06-18 MED ORDER — ACETAMINOPHEN 325 MG PO TABS
650.0000 mg | ORAL_TABLET | ORAL | Status: DC | PRN
  Administered 2020-06-20 – 2020-06-22 (×2): 650 mg via ORAL
  Filled 2020-06-18 (×2): qty 2

## 2020-06-18 NOTE — ED Triage Notes (Signed)
BIB EMS for chest pain and abnormal cardiac enzyme (per PCP). Pt was here yesterday for same but did not want to wait and left AMA.pale. Per EMS pt was afib RVR 110-130s. Hx of afib. Pt reports lethargy, low appetite and weakness, vomiting on and off the last few weeks. IBS also acting up. Sugar was 60 given oral glucose, 100/40.

## 2020-06-18 NOTE — ED Provider Notes (Signed)
[facility] EMERGENCY DEPARTMENT Provider Note  CSN: [id number] Arrival date & time: 06/16/2020 1052    History Chief Complaint  Patient presents with  . Chest Pain  . Abnormal Lab    HPI  Crystal [name] is a 75 y.o. female with prior history of afib, s/p cardioversion about 2 years ago, no longer on anticoagulation has been sick with vomiting and diarrhea for a couple of weeks that she has attributed to IBP and not much improved with Zofran and Lomotil prescribed by her PCP. Yesterday her husband noticed her HR was elevated as high as 145bpm. He gave her several of his own metoprolol which she does not take. She began to feel more weak and so she came to the ED for evaluation but left due to long wait times. She called her PCP this morning and had a tele-visit where she was noted to be more weak unable to get up out of bed due to leg weakness. Unable to eat or drink much. Labs from yesterday reviewed showing mild hyponatremia, AKI and elevated Trop. She has not had any chest pain but she does report some R shoulder pain and her HR has continued to be elevated. So she was directed back to the ED for re-evaluation.   Past Medical History:  Diagnosis Date  . Adenomatous colon polyp 2007 & 2010    Dr [name]  . Allergy    seasonal  . Arthritis   . Asthma   . Cataract   . Diverticulosis   . 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 Hyperlipidemia   . Hypertension   . IBS (irritable bowel syndrome)   . Legally blind   . MVP (mitral valve prolapse)   . Paroxysmal atrial fibrillation with rapid ventricular response ([place]) 08/29/2017  . Peripheral vascular disease ([place])   . RAD (reactive airway disease)     Past Surgical History:  Procedure Laterality Date  . ABDOMINAL HYSTERECTOMY     with USO for dysfunctionall menses  . APPENDECTOMY    . ARTERY BIOPSY Bilateral 07/21/2017   Procedure: BIOPSY BILATERAL TEMPORAL ARTERY;  Surgeon: [name], MD;  Location: [facility];   Service: Vascular;  Laterality: Bilateral;  . CARDIOVERSION N/A 06/16/2018   Procedure: CARDIOVERSION;  Surgeon: [name], MD;  Location: [facility];  Service: Cardiovascular;  Laterality: N/A;  . CATARACT EXTRACTION W/ INTRAOCULAR LENS IMPLANT  2013   bilateral; Dr [name]  . COLONOSCOPY  2013   negative , due 2018; Dr [name]  . COLONOSCOPY W/ POLYPECTOMY      X2 ; diverticulosis  . EMBOLECTOMY Right 08/29/2017   Procedure: Right Groin Exploration  ;  Surgeon: Rosetta Posner, MD;  Location: [facility];  Service: Vascular;  Laterality: Right;  . HIP PINNING,CANNULATED Right 03/21/2018  . HIP PINNING,CANNULATED Right 03/21/2018   Procedure: CANNULATED HIP PINNING;  Surgeon: Rod Can, MD;  Location: [facility];  Service: Orthopedics;  Laterality: Right;  . LUMBAR LAMINECTOMY  2000   Dr Vertell Limber  . SHOULDER SURGERY     R shoulder  . TONSILLECTOMY      Family History  Problem Relation Age of Onset  . Heart attack Brother 27  . Parkinsonism Mother   . Stroke Mother   . Heart attack Father        pre 31  . Diabetes Sister   . COPD Sister        X 3  . Other Son  suicide 2016  . Colon cancer Neg Hx   . Cancer Neg Hx   . Esophageal cancer Neg Hx   . Liver cancer Neg Hx   . Pancreatic cancer Neg Hx   . Rectal cancer Neg Hx   . Stomach cancer Neg Hx     Social History   Tobacco Use  . Smoking status: Former Smoker    Packs/day: 1.00    Years: 23.00    Pack years: 23.00    Quit date: 06/14/1989    Years since quitting: 31.0  . Smokeless tobacco: Never Used  . Tobacco comment: smoked 1961-1991 , up to < 1 ppd  Vaping Use  . Vaping Use: Never used  Substance Use Topics  . Alcohol use: Yes    Alcohol/week: 14.0 standard drinks    Types: 14 Glasses of wine per week    Comment: Wine  . Drug use: No     Home Medications Prior to Admission medications   Medication Sig Start Date End Date Taking? Authorizing Provider  albuterol (VENTOLIN HFA) 108 (90 Base) MCG/ACT  inhaler INHALE ONE PUFF BY MOUTH TWICE A DAY AS NEEDED Patient taking differently: Inhale 1-2 puffs into the lungs every 6 (six) hours as needed for wheezing. 05/01/20   [name], MD  allopurinol (ZYLOPRIM) 300 MG tablet Take 1 tablet (300 mg total) by mouth daily. 04/09/20   [name], MD  amoxicillin-clavulanate (AUGMENTIN) 875-125 MG tablet Take 1 tablet by mouth 2 (two) times daily. 05/30/20   [name], MD  Calcium Citrate-Vitamin D (CALCIUM + D PO) Take 1 tablet by mouth daily.    [provider]  Cholecalciferol (VITAMIN D3) 1000 units CAPS Take 1 capsule (1,000 Units total) by mouth daily with lunch. Patient taking differently: Take 1,000 Units by mouth daily. 03/30/18   Angiulli, Lavon Paganini, PA-C  diclofenac Sodium (VOLTAREN) 1 % GEL Apply 2 g topically 2 (two) times daily as needed (back pain).    [provider]  diltiazem (CARDIZEM CD) 180 MG 24 hr capsule Take 1 capsule (180 mg total) by mouth daily. 10/29/19 01/27/20  [name], MD  diphenoxylate-atropine (LOMOTIL) 2.5-0.025 MG tablet TAKE ONE TABLET BY MOUTH FOUR TIMES A DAY AS NEEDED FOR DIARRHEA OR LOOSE STOOLS 06/16/20   [name], MD  furosemide (LASIX) 40 MG tablet TAKE ONE TABLET BY MOUTH DAILY 02/04/20   [name], MD  gabapentin (NEURONTIN) 300 MG capsule Take 2 capsules (600 mg total) by mouth at bedtime. 09/21/19   Burns, Claudina Lick, MD  KLOR-CON M20 20 MEQ tablet TAKE TWO TABLETS BY MOUTH DAILY Patient taking differently: Take 20 mEq by mouth daily. 02/04/20   [name], MD  levothyroxine (SYNTHROID) 75 MCG tablet Take 1 tablet (75 mcg total) by mouth daily. 01/16/20   Burns, Claudina Lick, MD  LORazepam (ATIVAN) 1 MG tablet TAKE ONE TABLET BY MOUTH EVERY NIGHT AT BEDTIME Patient taking differently: Take 1 mg by mouth at bedtime. 05/13/20   Burns, Claudina Lick, MD  methocarbamol (ROBAXIN) 500 MG tablet TAKE ONE TABLET BY MOUTH EVERY 6 HOURS AS NEEDED FOR MUSCLE SPASMS Patient taking  differently: Take 500 mg by mouth every 6 (six) hours as needed for muscle spasms. 03/03/20   Burns, Claudina Lick, MD  montelukast (SINGULAIR) 10 MG tablet TAKE ONE TABLET BY MOUTH EVERY NIGHT AT BEDTIME Patient taking differently: Take 10 mg by mouth at bedtime. 03/24/20   [name], MD  ondansetron (ZOFRAN) 4 MG tablet Take 1 tablet (4 mg total) by mouth every 8 (eight) hours as needed for nausea or vomiting. 06/16/20   [name], MD  predniSONE (DELTASONE) 5 MG tablet Take 5 mg by mouth daily. 03/12/20   [provider]  SYMBICORT 160-4.5 MCG/ACT inhaler INHALE 1 TO 2 PUFFS BY MOUTH EVERY 12 HOURS THEN GARGLE AND SPIT AFTER USE Patient taking differently: Inhale 2 puffs into the lungs 2 times daily at 12 noon and 4 pm. 01/21/20   [name], MD  theophylline (UNIPHYL) 400 MG 24 hr tablet TAKE 1/2 TABLET BY MOUTH TWO TIMES A DAY Patient taking differently: Take 200 mg by mouth in the morning and at bedtime. 01/14/20   Burns, Claudina Lick, MD  traMADol (ULTRAM) 50 MG tablet TAKE ONE TABLET BY MOUTH EVERY 8 HOURS AS NEEDED FOR MODERATE PAIN Patient taking differently: Take 50 mg by mouth 3 (three) times daily. 05/21/20   [name], MD     Allergies    Simvastatin   Review of Systems   Review of Systems A comprehensive review of systems was completed and negative except as noted in HPI.    Physical Exam BP 103/77   Pulse (!) 115   Temp 98.2 F (36.8 C) (Oral)   Resp 17   Ht 5\' 3"  (1.6 m)   Wt 54.4 kg   SpO2 92%   BMI 21.24 kg/m   Physical Exam Vitals and nursing note reviewed.  Constitutional:      Appearance: Normal appearance.  HENT:     Head: Normocephalic and atraumatic.     Nose: Nose normal.     Mouth/Throat:     Mouth: Mucous membranes are dry.  Eyes:     Extraocular Movements: Extraocular movements intact.     Conjunctiva/sclera: Conjunctivae normal.  Cardiovascular:     Rate and Rhythm: Tachycardia present. Rhythm irregular.  Pulmonary:     Effort:  Pulmonary effort is normal.     Breath sounds: Normal breath sounds.  Abdominal:     General: Abdomen is flat.     Palpations: Abdomen is soft.     Tenderness: There is no abdominal tenderness.  Musculoskeletal:        General: Normal range of motion.     Cervical back: Neck supple.     Right lower leg: Edema present.     Left lower leg: Edema present.  Skin:    General: Skin is warm and dry.  Neurological:     General: No focal deficit present.     Mental Status: She is alert.  Psychiatric:        Mood and Affect: Mood normal.      ED Results / Procedures / Treatments   Labs (all labs ordered are listed, but only abnormal results are displayed) Labs Reviewed  CBC WITH DIFFERENTIAL/PLATELET - Abnormal; Notable for the following components:      Result Value   WBC 22.0 (*)    Hemoglobin 11.2 (*)    MCH 24.0 (*)    MCHC 27.8 (*)    RDW 21.0 (*)    Platelets 544 (*)    Neutro Abs 16.0 (*)    Monocytes Absolute 4.2 (*)    Basophils Absolute 0.2 (*)    Abs Immature Granulocytes 0.18 (*)    All other components within normal limits  COMPREHENSIVE METABOLIC PANEL - Abnormal; Notable for the following components:   Sodium 132 (*)  Potassium 3.2 (*)    Chloride 94 (*)    Creatinine, Ser 1.95 (*)    Calcium 8.6 (*)    Total Protein 5.8 (*)    Albumin 3.4 (*)    Alkaline Phosphatase 136 (*)    Total Bilirubin 1.4 (*)    GFR, Estimated 26 (*)    Anion gap 16 (*)    All other components within normal limits  TROPONIN I (HIGH SENSITIVITY) - Abnormal; Notable for the following components:   Troponin I (High Sensitivity) 42 (*)    All other components within normal limits  CULTURE, BLOOD (ROUTINE X 2)  CULTURE, BLOOD (ROUTINE X 2)  RESP PANEL BY RT-PCR (FLU A&B, COVID) ARPGX2  MAGNESIUM  PATHOLOGIST SMEAR REVIEW  LACTIC ACID, PLASMA  LACTIC ACID, PLASMA  URINALYSIS, ROUTINE W REFLEX MICROSCOPIC  HEPARIN LEVEL (UNFRACTIONATED)  TROPONIN I (HIGH SENSITIVITY)     EKG EKG Interpretation  Date/Time:  Wednesday Jul 06, 2020 11:07:29 EST Ventricular Rate:  129 PR Interval:    QRS Duration: 100 QT Interval:  342 QTC Calculation: 501 R Axis:   39 Text Interpretation: Atrial flutter with predominant 2:1 AV block Anterior infarct, old Nonspecific T abnormalities, lateral leads Minimal ST elevation, inferior leads Prolonged QT interval No significant change since last tracing Confirmed by Susy Frizzle [phone]) on Jul 06, 2020 11:15:59 AM Also confirmed by Susy Frizzle [phone]), editor Elita Quick [phone])  on 2020/07/06 11:23:14 AM   Radiology DG Chest 2 View  Result Date: 06/17/2020 CLINICAL DATA:  Chest pain EXAM: CHEST - 2 VIEW COMPARISON:  05/26/2020 chest radiograph. FINDINGS: Stable cardiomediastinal silhouette with mild cardiomegaly. No pneumothorax. Small bilateral pleural effusions, left greater than right. Cephalization of the pulmonary vasculature without overt pulmonary edema. Dense left lung base consolidation. IMPRESSION: 1. Small bilateral pleural effusions, left greater than right. 2. Mild cardiomegaly without overt pulmonary edema. 3. Dense left lung base consolidation, which could represent atelectasis, aspiration or pneumonia. Chest radiograph follow-up advised. Electronically Signed   By: Delbert Phenix M.D.   On: 06/17/2020 16:19    Procedures .Critical Care Performed by: Pollyann Savoy, MD Authorized by: Pollyann Savoy, MD   Critical care provider statement:    Critical care time (minutes):  45   Critical care time was exclusive of:  Separately billable procedures and treating other patients   Critical care was necessary to treat or prevent imminent or life-threatening deterioration of the following conditions:  Cardiac failure and renal failure   Critical care was time spent personally by me on the following activities:  Discussions with consultants, evaluation of patient's response to treatment, examination of  patient, ordering and performing treatments and interventions, ordering and review of laboratory studies, ordering and review of radiographic studies, pulse oximetry, re-evaluation of patient's condition, obtaining history from patient or surrogate and review of old charts    Medications Ordered in the ED Medications - No data to display   MDM Rules/Calculators/A&P MDM Patient with afib, rapid rate here but also signs of dehydration. Will begin with IVF bolus to see if that helps her HR. She is no longer anticoagulated so not a candidate for cardioversion. Recheck labs including chemistry and Trop to compare to yesterday.  ED Course  I have reviewed the triage vital signs and the nursing notes.  Pertinent labs & imaging results that were available during my care of the patient were reviewed by me and considered in my medical decision making (see chart for details).  Clinical Course as of  07/08/2020 1505  Wed Jun 18, 2020  1256 WBC has increased from yesterday.  [CS]  1315 Creatinine continues to worsen from yesterday. Tachycardia remains, not hypotensive or febrile to suggest sepsis but given persistent tachycardia will check cultures and lactic acid. No infectious source by history or exam.  [CS]  1318 Trop is mildly worsened from yesterday of unclear significance. She does not have any ischemic sounding pain.  [CS]  [id number] Previously elevated WBC when she was here for diverticulitis a few weeks ago, had improved on visit yesterday but back over 20 today. She reports she is feeling OK for now. Will discuss rate control strategy with cardiology. Not much improvement after a liter of saline.  [CS]  [id number] Spoke with Dr. Gwenlyn Found, Cardiology. He recommends initiating heparin for anticoagulation and cardizem drip for rate control. CXR reviewed, improved from previous. Will discuss admission with Hospitalist.  [CS]  1410 Spoke with Dr. Reesa Chew, Hospitalist, who will evaluate for admission.  [CS]  1504 Lactic  acid is mildly elevated. Again no definite source of infection to suggest sepsis as a cause, but will give additional LR and reassess pending admission.  [CS]    Clinical Course User Index [CS] Truddie Hidden, MD    Final Clinical Impression(s) / ED Diagnoses Final diagnoses:  AKI (acute kidney injury) ([place])  Leukocytosis, unspecified type  Atrial fibrillation with rapid ventricular response [facility])    Rx / DC Orders ED Discharge Orders    None       Truddie Hidden, MD 06/22/2020 1413

## 2020-06-18 NOTE — H&P (Signed)
History and Physical    Crystal [name] [id number] DOB: 1945/10/20 DOA: 07/10/2020  PCP: [name], MD   Patient coming from: Home  I have personally briefly reviewed patient's old medical records in [place]  Chief Complaint: Generalized weakness and palpitations  HPI: Crystal [name] is a 75 y.o. female with medical history significant of afib, s/p cardioversion about 2 years ago, no longer on anticoagulation, IBS, IPF, hypertension, hyperlipidemia and peripheral vascular disease came to ED with generalized weakness and palpitations.  According to patient and her husband she developed palpitations yesterday, husband noticed high heart rate and gave her his metoprolol that help with the heart rate but dropped her blood pressure.  She came to ED for evaluation and then left without being seen due to long wait time.  This morning she was feeling more weak and continue to have palpitations, talked with her PCP who also evaluated her labs drawn while she was here yesterday which shows mildly elevated troponin and AKI and advised her to come back to ED for further evaluation.  Patient denies any chest pain or shortness of breath.  No orthopnea or PND.  Denies any upper respiratory symptoms or sick contacts.  No current nausea, vomiting or diarrhea.  Apparently she had an history of IBS and had nausea, vomiting and diarrhea couple of weeks ago and received a course of Augmentin for possible diverticulitis.  Symptoms has been resolved since then.  No fever or chills.  No urinary symptoms.  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  He was not on any anticoagulation for a long time now stating that she was experiencing recurrent GI bleed.  Did not had A. fib since she had her cardioversion approximately 2 years ago.  ED Course: On arrival she was found to be in A. fib/flutter with RVR, labs pertinent for leukocytosis of 22, sodium of 132, potassium of 3.2, creatinine of  1.95, troponin of 42>>53, lactic acid of 2.3, COVID-19 test pending, chest x-ray with small bilateral pleural effusions, left greater than right, left base atelectasis/infiltrate.  Review of Systems: As per HPI otherwise 10 point review of systems negative.   Past Medical History:  Diagnosis Date  . Adenomatous colon polyp 2007 & 2010    Dr [name]  . Allergy    seasonal  . Arthritis   . Asthma   . Cataract   . Diverticulosis   . 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 Hyperlipidemia   . Hypertension   . IBS (irritable bowel syndrome)   . Legally blind   . MVP (mitral valve prolapse)   . Paroxysmal atrial fibrillation with rapid ventricular response ([place]) 08/29/2017  . Peripheral vascular disease ([place])   . RAD (reactive airway disease)     Past Surgical History:  Procedure Laterality Date  . ABDOMINAL HYSTERECTOMY     with USO for dysfunctionall menses  . APPENDECTOMY    . ARTERY BIOPSY Bilateral 07/21/2017   Procedure: BIOPSY BILATERAL TEMPORAL ARTERY;  Surgeon: [name], MD;  Location: [facility];  Service: Vascular;  Laterality: Bilateral;  . CARDIOVERSION N/A 06/16/2018   Procedure: CARDIOVERSION;  Surgeon: [name], MD;  Location: [facility];  Service: Cardiovascular;  Laterality: N/A;  . CATARACT EXTRACTION W/ INTRAOCULAR LENS IMPLANT  2013   bilateral; Dr [name]  . COLONOSCOPY  2013   negative , due 2018; Dr [name]  . COLONOSCOPY W/ POLYPECTOMY      X2 ;  diverticulosis  . EMBOLECTOMY Right 08/29/2017   Procedure: Right Groin Exploration  ;  Surgeon: Larina Earthly, MD;  Location: [facility] OR;  Service: Vascular;  Laterality: Right;  . HIP PINNING,CANNULATED Right 03/21/2018  . HIP PINNING,CANNULATED Right 03/21/2018   Procedure: CANNULATED HIP PINNING;  Surgeon: Samson Frederic, MD;  Location: [facility] OR;  Service: Orthopedics;  Laterality: Right;  . LUMBAR LAMINECTOMY  2000   Dr Venetia Maxon  . SHOULDER SURGERY     R shoulder  . TONSILLECTOMY        reports that she quit smoking about 31 years ago. She has a 23.00 pack-year smoking history. She has never used smokeless tobacco. She reports current alcohol use of about 14.0 standard drinks of alcohol per week. She reports that she does not use drugs.  Allergies  Allergen Reactions  . Simvastatin Other (See Comments)    myalgias    Family History  Problem Relation Age of Onset  . Heart attack Brother 49  . Parkinsonism Mother   . Stroke Mother   . Heart attack Father        pre 89  . Diabetes Sister   . COPD Sister        X 3  . Other Son        suicide 2016  . Colon cancer Neg Hx   . Cancer Neg Hx   . Esophageal cancer Neg Hx   . Liver cancer Neg Hx   . Pancreatic cancer Neg Hx   . Rectal cancer Neg Hx   . Stomach cancer Neg Hx     Prior to Admission medications   Medication Sig Start Date End Date Taking? Authorizing Provider  albuterol (VENTOLIN HFA) 108 (90 Base) MCG/ACT inhaler INHALE ONE PUFF BY MOUTH TWICE A DAY AS NEEDED Patient taking differently: Inhale 1-2 puffs into the lungs every 6 (six) hours as needed for wheezing. 05/01/20   Pincus Sanes, MD  allopurinol (ZYLOPRIM) 300 MG tablet Take 1 tablet (300 mg total) by mouth daily. 04/09/20   Pincus Sanes, MD  amoxicillin-clavulanate (AUGMENTIN) 875-125 MG tablet Take 1 tablet by mouth 2 (two) times daily. 05/30/20   Pincus Sanes, MD  Calcium Citrate-Vitamin D (CALCIUM + D PO) Take 1 tablet by mouth daily.    [provider]  Cholecalciferol (VITAMIN D3) 1000 units CAPS Take 1 capsule (1,000 Units total) by mouth daily with lunch. Patient taking differently: Take 1,000 Units by mouth daily. 03/30/18   Angiulli, Mcarthur Rossetti, PA-C  diclofenac Sodium (VOLTAREN) 1 % GEL Apply 2 g topically 2 (two) times daily as needed (back pain).    [provider]  diltiazem (CARDIZEM CD) 180 MG 24 hr capsule Take 1 capsule (180 mg total) by mouth daily. 10/29/19 01/27/20  Lewayne Bunting, MD   diphenoxylate-atropine (LOMOTIL) 2.5-0.025 MG tablet TAKE ONE TABLET BY MOUTH FOUR TIMES A DAY AS NEEDED FOR DIARRHEA OR LOOSE STOOLS 06/16/20   Pincus Sanes, MD  furosemide (LASIX) 40 MG tablet TAKE ONE TABLET BY MOUTH DAILY 02/04/20   Pincus Sanes, MD  gabapentin (NEURONTIN) 300 MG capsule Take 2 capsules (600 mg total) by mouth at bedtime. 09/21/19   Burns, Bobette Mo, MD  KLOR-CON M20 20 MEQ tablet TAKE TWO TABLETS BY MOUTH DAILY Patient taking differently: Take 20 mEq by mouth daily. 02/04/20   Pincus Sanes, MD  levothyroxine (SYNTHROID) 75 MCG tablet Take 1 tablet (75 mcg total) by mouth daily. 01/16/20   Burns,  Claudina Lick, MD  LORazepam (ATIVAN) 1 MG tablet TAKE ONE TABLET BY MOUTH EVERY NIGHT AT BEDTIME Patient taking differently: Take 1 mg by mouth at bedtime. 05/13/20   Burns, Claudina Lick, MD  methocarbamol (ROBAXIN) 500 MG tablet TAKE ONE TABLET BY MOUTH EVERY 6 HOURS AS NEEDED FOR MUSCLE SPASMS Patient taking differently: Take 500 mg by mouth every 6 (six) hours as needed for muscle spasms. 03/03/20   Burns, Claudina Lick, MD  montelukast (SINGULAIR) 10 MG tablet TAKE ONE TABLET BY MOUTH EVERY NIGHT AT BEDTIME Patient taking differently: Take 10 mg by mouth at bedtime. 03/24/20   Burns, Claudina Lick, MD  ondansetron (ZOFRAN) 4 MG tablet Take 1 tablet (4 mg total) by mouth every 8 (eight) hours as needed for nausea or vomiting. 06/16/20   [name], MD  predniSONE (DELTASONE) 5 MG tablet Take 5 mg by mouth daily. 03/12/20   [provider]  SYMBICORT 160-4.5 MCG/ACT inhaler INHALE 1 TO 2 PUFFS BY MOUTH EVERY 12 HOURS THEN GARGLE AND SPIT AFTER USE Patient taking differently: Inhale 2 puffs into the lungs 2 times daily at 12 noon and 4 pm. 01/21/20   [name], MD  theophylline (UNIPHYL) 400 MG 24 hr tablet TAKE 1/2 TABLET BY MOUTH TWO TIMES A DAY Patient taking differently: Take 200 mg by mouth in the morning and at bedtime. 01/14/20   Burns, Claudina Lick, MD  traMADol (ULTRAM) 50 MG tablet TAKE ONE  TABLET BY MOUTH EVERY 8 HOURS AS NEEDED FOR MODERATE PAIN Patient taking differently: Take 50 mg by mouth 3 (three) times daily. 05/21/20   [name], MD    Physical Exam: Vitals:   07/06/2020 1330 07/03/2020 1400 06/29/2020 1500 07/08/2020 1515  BP: 102/82 119/85 119/72 116/77  Pulse: (!) 110 (!) 111 (!) 115 67  Resp: 20 18 20 17   Temp:      TempSrc:      SpO2: 99% 95% 95% 97%  Weight:      Height:        General: Vital signs reviewed.  Patient is well-developed and well-nourished, in no acute distress and cooperative with exam.  Head: Normocephalic and atraumatic. Eyes: EOMI, conjunctivae normal, no scleral icterus.  ENMT: Mucous membranes are moist. Neck: Supple, trachea midline, normal ROM, Cardiovascular: Irregularly irregular Pulmonary/Chest: Few basal crackles bilaterally,  Abdominal: Soft, non-tender, non-distended, BS +,  Extremities: 2+ lower extremity edema bilaterally, some weeping wound on left lower extremity, pulses symmetric and intact bilaterally.  Neurological: A&O x3, Strength is normal and symmetric bilaterally, cranial nerve II-XII are grossly intact, no focal motor deficit, sensory intact to light touch bilaterally.  Skin: Warm, dry and intact.  Multiple scattered ecchymosis. Psychiatric: Normal mood and affect.  Labs on Admission: I have personally reviewed following labs and imaging studies  CBC: Recent Labs  Lab 06/17/20 1607 07/14/2020 1115  WBC 15.6* 22.0*  NEUTROABS  --  16.0*  HGB 11.1* 11.2*  HCT 37.2 40.3  MCV 84.5 86.5  PLT 687* 0000000*   Basic Metabolic Panel: Recent Labs  Lab 06/17/20 1607 06/30/2020 1121  NA 131* 132*  K 3.3* 3.2*  CL 94* 94*  CO2 21* 22  GLUCOSE 137* 85  BUN 12 15  CREATININE 1.26* 1.95*  CALCIUM 8.5* 8.6*  MG  --  1.7   GFR: Estimated Creatinine Clearance: 20.3 mL/min (A) (by C-G formula based on SCr of 1.95 mg/dL (H)). Liver Function Tests: Recent Labs  Lab 06/15/2020 1121  AST 35  ALT 16  ALKPHOS 136*   BILITOT 1.4*  PROT 5.8*  ALBUMIN 3.4*   No results for input(s): LIPASE, AMYLASE in the last 168 hours. No results for input(s): AMMONIA in the last 168 hours. Coagulation Profile: No results for input(s): INR, PROTIME in the last 168 hours. Cardiac Enzymes: No results for input(s): CKTOTAL, CKMB, CKMBINDEX, TROPONINI in the last 168 hours. BNP (last 3 results) No results for input(s): PROBNP in the last 8760 hours. HbA1C: No results for input(s): HGBA1C in the last 72 hours. CBG: No results for input(s): GLUCAP in the last 168 hours. Lipid Profile: No results for input(s): CHOL, HDL, LDLCALC, TRIG, CHOLHDL, LDLDIRECT in the last 72 hours. Thyroid Function Tests: No results for input(s): TSH, T4TOTAL, FREET4, T3FREE, THYROIDAB in the last 72 hours. Anemia Panel: No results for input(s): VITAMINB12, FOLATE, FERRITIN, TIBC, IRON, RETICCTPCT in the last 72 hours. Urine analysis:    Component Value Date/Time   COLORURINE YELLOW 05/26/2020 1707   APPEARANCEUR CLEAR 05/26/2020 1707   LABSPEC 1.016 05/26/2020 1707   PHURINE 6.0 05/26/2020 1707   GLUCOSEU NEGATIVE 05/26/2020 1707   GLUCOSEU NEGATIVE 04/12/2018 0746   HGBUR NEGATIVE 05/26/2020 1707   BILIRUBINUR NEGATIVE 05/26/2020 1707   KETONESUR NEGATIVE 05/26/2020 1707   PROTEINUR NEGATIVE 05/26/2020 1707   UROBILINOGEN 4.0 (A) 04/12/2018 0746   NITRITE NEGATIVE 05/26/2020 1707   LEUKOCYTESUR NEGATIVE 05/26/2020 1707    Radiological Exams on Admission: DG Chest 2 View  Result Date: 06/17/2020 CLINICAL DATA:  Chest pain EXAM: CHEST - 2 VIEW COMPARISON:  05/26/2020 chest radiograph. FINDINGS: Stable cardiomediastinal silhouette with mild cardiomegaly. No pneumothorax. Small bilateral pleural effusions, left greater than right. Cephalization of the pulmonary vasculature without overt pulmonary edema. Dense left lung base consolidation. IMPRESSION: 1. Small bilateral pleural effusions, left greater than right. 2. Mild cardiomegaly  without overt pulmonary edema. 3. Dense left lung base consolidation, which could represent atelectasis, aspiration or pneumonia. Chest radiograph follow-up advised. Electronically Signed   By: Ilona Sorrel M.D.   On: 06/17/2020 16:19   DG Chest Port 1 View  Result Date: 07/02/2020 CLINICAL DATA:  Sepsis.  Chest pain. EXAM: PORTABLE CHEST 1 VIEW COMPARISON:  06/17/2020. FINDINGS: Mediastinum and hilar structures normal. Cardiomegaly. Interim significant clearing of bilateral interstitial prominence suggesting clearing CHF. Mild residual. Persistent left base atelectasis/infiltrate. Interim near complete clearing of right pleural effusion. Persistent moderate left-sided pleural effusion. No pneumothorax. IMPRESSION: 1. Cardiomegaly. Interim significant clearing of bilateral interstitial prominence suggesting clearing CHF. Mild residual. 2. Persistent left base atelectasis/infiltrate and moderate left pleural effusion. Electronically Signed   By: Marcello Moores  Register   On: 06/27/2020 13:44    EKG: Independently reviewed.  Atrial fibrillation/flutter with ventricular rate around 130. Nonspecific ST changes.  Assessment/Plan Active Problems:   Atrial fibrillation with RVR (HCC)   A. fib with RVR.  Patient had a prior history of paroxysmal atrial fibrillation and a prior cardioversion.  Not on any chronic anticoagulation due to GI bleeds.  Cardiology was consulted from ED and they recommend starting her on Cardizem and heparin infusions. -Cardizem infusion. -Heparin infusion.  Elevated troponin.  No chest pain.  Might be secondary to demand ischemia with A. Fib. -Continue to monitor. -Echocardiogram to rule out any wall motion abnormalities and CHF.  Leukocytosis.  Seems chronic with some worsening, most likely reactive.  Chest x-ray with concern of left base infiltrate, patient afebrile and no upper respiratory symptoms so less likely pneumonia. Will check procalcitonin. No need for antibiotics at  this time. Continue to monitor  Lactic acidosis.  Most likely secondary to hypoxia with A. fib with  RVR. -Monitor lactic acid  AKI.  Creatinine at 1.95 with baseline below 1. -Monitor renal function -Avoid nephrotoxins.  Hypokalemia.  Potassium at 3.2. -Check magnesium. -Replace potassium and monitor.  Pleural effusion.  Chest x-ray concerning for bilateral pleural effusion left more than right.  No prior diagnosis of heart failure but can be due to CHF in the setting of A. fib with RVR.  Clinically appears volume up. -Check BNP. -Echocardiogram. -Lasix 40 mg IV daily, patient normally takes 40 mg p.o. daily. -Daily weight and strict intake and output.  History of IBS.  Patient do get flares, no acute concern.  Most recent flare was approximately 2 weeks ago. -Continue to monitor  DVT prophylaxis: Heparin Code Status: DNR Family Communication: Husband was updated at bedside Disposition Plan: Home  Consults called: Cardiology Admission status: Observation   Lorella Nimrod MD [facility]  If 7PM-7AM, please contact night-coverage [email or web address]  07/11/2020, 3:55 PM   This record has been created using Systems analyst. Errors have been sought and corrected,but may not always be located. Such creation errors do not reflect on the standard of care.

## 2020-06-18 NOTE — Progress Notes (Addendum)
Virtual Visit via Audio Note  I connected with Crystal [name] on 06/18/20 at  9:20 AM EST by an audio-only enabled telemedicine application and verified that I am speaking with the correct person using two identifiers.  The patient and the provider were at separate locations throughout the entire encounter. Patient location: home, Provider location: work   I discussed the limitations of evaluation and management by telemedicine and the availability of in person appointments. The patient expressed understanding and agreed to proceed. The patient and the provider were the only parties present for the visit unless noted in HPI below.  History of Present Illness: The patient is a 75 y.o. female with visit for vomiting, diarrhea and vomiting for about 2 weeks. PCP sent in zofran and lomotil yesterday. She has also had ER visit on 05/26/20 with course of augmentin. She is very weak and dizziness with standing. She went to ER yesterday due to feeling so poorly and had increased creatinine and elevated troponin and low sdium. Due to long wait time without seeing a provider as well as not getting any water she did leaving after a number of hours. Since getting home she has gotten weaker and is unable to transfer independently at this time. She denies fevers. She is having worsening left and right shoulder pain with left arm pain. She was in A fib yesterday and may still be in that today. Talked to patient and her daughter during this phone call to help get the information. Started getting better from diarrhea and vomiting with the zofran and lomotil. She has not eaten anything today due to not feeling like moving. She is drinking some liquids. Overall condition is worsening.   Observations/Objective: A and O times 3, voice not weak, no dyspnea during visit  Assessment and Plan: See problem oriented charting  Follow Up Instructions: Go to ER due to elevated troponin and low sodium on labs from yesterday and having  left arm pain and right and left shoulder pain  Visit time 16 minutes in non-face to face communication with patient and coordination of care.  I discussed the assessment and treatment plan with the patient. The patient was provided an opportunity to ask questions and all were answered. The patient agreed with the plan and demonstrated an understanding of the instructions.   The patient was advised to call back or seek an in-person evaluation if the symptoms worsen or if the condition fails to improve as anticipated.  Myrlene Broker, MD

## 2020-06-18 NOTE — ED Notes (Signed)
RN made aware of pts O2.

## 2020-06-18 NOTE — Progress Notes (Signed)
ANTICOAGULATION CONSULT NOTE - Initial Consult  Pharmacy Consult for heparin Indication: atrial fibrillation  Allergies  Allergen Reactions  . Simvastatin Other (See Comments)    myalgias    Patient Measurements: Height: 5\' 3"  (160 cm) Weight: 54.4 kg (119 lb 14.9 oz) IBW/kg (Calculated) : 52.4 Heparin Dosing Weight: TBW  Vital Signs: Temp: 98.2 F (36.8 C) (01/05 1103) Temp Source: Oral (01/05 1103) BP: 102/82 (01/05 1330) Pulse Rate: 110 (01/05 1330)  Labs: Recent Labs    06/17/20 1607 July 15, 2020 1115 07/15/20 1121  HGB 11.1* 11.2*  --   HCT 37.2 40.3  --   PLT 687* 544*  --   CREATININE 1.26*  --  1.95*  TROPONINIHS 38* 42*  --     Estimated Creatinine Clearance: 20.3 mL/min (A) (by C-G formula based on SCr of 1.95 mg/dL (H)).   Medical History: Past Medical History:  Diagnosis Date  . Adenomatous colon polyp 2007 & 2010    Dr 2011  . Allergy    seasonal  . Arthritis   . Asthma   . Cataract   . Diverticulosis   . 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 Hyperlipidemia   . Hypertension   . IBS (irritable bowel syndrome)   . Legally blind   . MVP (mitral valve prolapse)   . Paroxysmal atrial fibrillation with rapid ventricular response ([facility]) 08/29/2017  . Peripheral vascular disease ([facility])   . RAD (reactive airway disease)    Assessment: 8 YOF presenting with lethargy, N/V, hx of afib was previously on Eliquis however is no longer on anticoagulation PTA.    Goal of Therapy:  Heparin level 0.3-0.7 units/ml Monitor platelets by anticoagulation protocol: Yes   Plan:  Heparin 2500 units IV x 1, and gtt at 800 units/hr F/u 8 hour heparin level F/u long term [facility] plan  [facility], PharmD Clinical Pharmacist ED Pharmacist Phone # [phone] 15-Jul-2020 1:58 PM

## 2020-06-18 NOTE — Progress Notes (Signed)
ANTICOAGULATION CONSULT NOTE  Pharmacy Consult for heparin Indication: atrial fibrillation  Allergies  Allergen Reactions  . Simvastatin Other (See Comments)    myalgias    Patient Measurements: Height: 5\' 3"  (160 cm) Weight: 54.4 kg (119 lb 14.9 oz) IBW/kg (Calculated) : 52.4 Heparin Dosing Weight: TBW  Vital Signs: Temp: 98.3 F (36.8 C) (01/05 2015) Temp Source: Oral (01/05 2015) BP: 96/60 (01/05 2015) Pulse Rate: 97 (01/05 2015)  Labs: Recent Labs    06/17/20 1607 06/27/2020 1115 06/27/20 1121 June 27, 2020 1401 06-27-20 2148  HGB 11.1* 11.2*  --   --   --   HCT 37.2 40.3  --   --   --   PLT 687* 544*  --   --   --   HEPARINUNFRC  --   --   --   --  0.29*  CREATININE 1.26*  --  1.95*  --   --   TROPONINIHS 38* 42*  --  53*  --     Estimated Creatinine Clearance: 20.3 mL/min (A) (by C-G formula based on SCr of 1.95 mg/dL (H)).   Medical History: Past Medical History:  Diagnosis Date  . Adenomatous colon polyp 2007 & 2010    Dr 2011  . Allergy    seasonal  . Arthritis   . Asthma   . Cataract   . Diverticulosis   . 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 Hyperlipidemia   . Hypertension   . IBS (irritable bowel syndrome)   . Legally blind   . MVP (mitral valve prolapse)   . Paroxysmal atrial fibrillation with rapid ventricular response ([facility]) 08/29/2017  . Peripheral vascular disease ([facility])   . RAD (reactive airway disease)    Assessment: 50 YOF presenting with lethargy, N/V, hx of afib was previously on Eliquis however is no longer on anticoagulation PTA.    Initial heparin level slightly subtherapeutic at 0.29.  Goal of Therapy:  Heparin level 0.3-0.7 units/ml Monitor platelets by anticoagulation protocol: Yes   Plan:  Increase heparin to 900 units/h Recheck heparin level in 8h   71, PharmD, [place], [facility] Clinical Pharmacist [phone] Please check AMION for all [facility] Pharmacy numbers 06/27/20

## 2020-06-18 NOTE — ED Notes (Signed)
Pt admitted to 2C03; report called to April, RN.

## 2020-06-18 NOTE — Telephone Encounter (Signed)
Per order of Dr Okey Dupre, called [facility] to let them know that we are sending Crystal [name] via EMS due to elevated troponin on labs from ER yesterday and left arm pain and weakness. Charge RN verb understanding.

## 2020-06-18 NOTE — Assessment & Plan Note (Signed)
Could be due to demand ischemia however without trending it is unclear if this represents infarction. She is advised to return to ER today for evaluation and repeat labs. She will likely need admission if persistently elevated troponin and low sodium. She did also have AKI on recent labs with doubled Cr. She has severe weakness per reports is unable to transfer independently.

## 2020-06-19 ENCOUNTER — Ambulatory Visit: Payer: Medicare Other | Admitting: Cardiology

## 2020-06-19 ENCOUNTER — Observation Stay (HOSPITAL_COMMUNITY): Payer: Medicare Other

## 2020-06-19 DIAGNOSIS — I5082 Biventricular heart failure: Secondary | ICD-10-CM | POA: Diagnosis not present

## 2020-06-19 DIAGNOSIS — I4901 Ventricular fibrillation: Secondary | ICD-10-CM | POA: Diagnosis not present

## 2020-06-19 DIAGNOSIS — Z9981 Dependence on supplemental oxygen: Secondary | ICD-10-CM | POA: Diagnosis not present

## 2020-06-19 DIAGNOSIS — I429 Cardiomyopathy, unspecified: Secondary | ICD-10-CM | POA: Diagnosis present

## 2020-06-19 DIAGNOSIS — E871 Hypo-osmolality and hyponatremia: Secondary | ICD-10-CM | POA: Diagnosis present

## 2020-06-19 DIAGNOSIS — M316 Other giant cell arteritis: Secondary | ICD-10-CM | POA: Diagnosis present

## 2020-06-19 DIAGNOSIS — H548 Legal blindness, as defined in USA: Secondary | ICD-10-CM | POA: Diagnosis present

## 2020-06-19 DIAGNOSIS — I313 Pericardial effusion (noninflammatory): Secondary | ICD-10-CM | POA: Diagnosis not present

## 2020-06-19 DIAGNOSIS — I071 Rheumatic tricuspid insufficiency: Secondary | ICD-10-CM | POA: Diagnosis not present

## 2020-06-19 DIAGNOSIS — I4892 Unspecified atrial flutter: Secondary | ICD-10-CM | POA: Diagnosis not present

## 2020-06-19 DIAGNOSIS — I462 Cardiac arrest due to underlying cardiac condition: Secondary | ICD-10-CM | POA: Diagnosis not present

## 2020-06-19 DIAGNOSIS — I5041 Acute combined systolic (congestive) and diastolic (congestive) heart failure: Secondary | ICD-10-CM | POA: Diagnosis present

## 2020-06-19 DIAGNOSIS — I472 Ventricular tachycardia: Secondary | ICD-10-CM | POA: Diagnosis not present

## 2020-06-19 DIAGNOSIS — I340 Nonrheumatic mitral (valve) insufficiency: Secondary | ICD-10-CM | POA: Diagnosis not present

## 2020-06-19 DIAGNOSIS — Z20822 Contact with and (suspected) exposure to covid-19: Secondary | ICD-10-CM | POA: Diagnosis present

## 2020-06-19 DIAGNOSIS — J90 Pleural effusion, not elsewhere classified: Secondary | ICD-10-CM | POA: Diagnosis not present

## 2020-06-19 DIAGNOSIS — E872 Acidosis: Secondary | ICD-10-CM | POA: Diagnosis present

## 2020-06-19 DIAGNOSIS — I4819 Other persistent atrial fibrillation: Secondary | ICD-10-CM | POA: Diagnosis present

## 2020-06-19 DIAGNOSIS — N179 Acute kidney failure, unspecified: Secondary | ICD-10-CM | POA: Diagnosis not present

## 2020-06-19 DIAGNOSIS — Z66 Do not resuscitate: Secondary | ICD-10-CM | POA: Diagnosis present

## 2020-06-19 DIAGNOSIS — I361 Nonrheumatic tricuspid (valve) insufficiency: Secondary | ICD-10-CM

## 2020-06-19 DIAGNOSIS — E876 Hypokalemia: Secondary | ICD-10-CM | POA: Diagnosis not present

## 2020-06-19 DIAGNOSIS — J9811 Atelectasis: Secondary | ICD-10-CM | POA: Diagnosis present

## 2020-06-19 DIAGNOSIS — R9431 Abnormal electrocardiogram [ECG] [EKG]: Secondary | ICD-10-CM

## 2020-06-19 DIAGNOSIS — Z515 Encounter for palliative care: Secondary | ICD-10-CM | POA: Diagnosis not present

## 2020-06-19 DIAGNOSIS — I248 Other forms of acute ischemic heart disease: Secondary | ICD-10-CM | POA: Diagnosis present

## 2020-06-19 DIAGNOSIS — J939 Pneumothorax, unspecified: Secondary | ICD-10-CM | POA: Diagnosis not present

## 2020-06-19 DIAGNOSIS — Q211 Atrial septal defect: Secondary | ICD-10-CM | POA: Diagnosis not present

## 2020-06-19 DIAGNOSIS — I110 Hypertensive heart disease with heart failure: Secondary | ICD-10-CM | POA: Diagnosis present

## 2020-06-19 DIAGNOSIS — I5021 Acute systolic (congestive) heart failure: Secondary | ICD-10-CM | POA: Diagnosis not present

## 2020-06-19 DIAGNOSIS — J9621 Acute and chronic respiratory failure with hypoxia: Secondary | ICD-10-CM | POA: Diagnosis not present

## 2020-06-19 DIAGNOSIS — I272 Pulmonary hypertension, unspecified: Secondary | ICD-10-CM | POA: Diagnosis present

## 2020-06-19 DIAGNOSIS — Z4682 Encounter for fitting and adjustment of non-vascular catheter: Secondary | ICD-10-CM | POA: Diagnosis not present

## 2020-06-19 DIAGNOSIS — I480 Paroxysmal atrial fibrillation: Secondary | ICD-10-CM | POA: Diagnosis not present

## 2020-06-19 DIAGNOSIS — I4891 Unspecified atrial fibrillation: Secondary | ICD-10-CM | POA: Diagnosis not present

## 2020-06-19 DIAGNOSIS — I484 Atypical atrial flutter: Secondary | ICD-10-CM | POA: Diagnosis present

## 2020-06-19 DIAGNOSIS — I959 Hypotension, unspecified: Secondary | ICD-10-CM | POA: Diagnosis not present

## 2020-06-19 DIAGNOSIS — I4581 Long QT syndrome: Secondary | ICD-10-CM | POA: Diagnosis not present

## 2020-06-19 LAB — BASIC METABOLIC PANEL
Anion gap: 16 — ABNORMAL HIGH (ref 5–15)
BUN: 16 mg/dL (ref 8–23)
CO2: 17 mmol/L — ABNORMAL LOW (ref 22–32)
Calcium: 7.6 mg/dL — ABNORMAL LOW (ref 8.9–10.3)
Chloride: 92 mmol/L — ABNORMAL LOW (ref 98–111)
Creatinine, Ser: 1.99 mg/dL — ABNORMAL HIGH (ref 0.44–1.00)
GFR, Estimated: 26 mL/min — ABNORMAL LOW (ref >60)
Glucose, Bld: 70 mg/dL (ref 70–99)
Potassium: 2.8 mmol/L — ABNORMAL LOW (ref 3.5–5.1)
Sodium: 125 mmol/L — ABNORMAL LOW (ref 135–145)

## 2020-06-19 LAB — SODIUM
Sodium: 131 mmol/L — ABNORMAL LOW (ref 135–145)
Sodium: 130 mmol/L — ABNORMAL LOW (ref 135–145)

## 2020-06-19 LAB — LACTIC ACID, PLASMA
Lactic Acid, Venous: 2.3 mmol/L (ref 0.5–1.9)
Lactic Acid, Venous: 3 mmol/L (ref 0.5–1.9)

## 2020-06-19 LAB — CBC
HCT: 35.8 % — ABNORMAL LOW (ref 36.0–46.0)
Hemoglobin: 10.3 g/dL — ABNORMAL LOW (ref 12.0–15.0)
MCH: 24.2 pg — ABNORMAL LOW (ref 26.0–34.0)
MCHC: 28.8 g/dL — ABNORMAL LOW (ref 30.0–36.0)
MCV: 84.2 fL (ref 80.0–100.0)
Platelets: 523 10*3/uL — ABNORMAL HIGH (ref 150–400)
RBC: 4.25 MIL/uL (ref 3.87–5.11)
RDW: 20.7 % — ABNORMAL HIGH (ref 11.5–15.5)
WBC: 20.5 10*3/uL — ABNORMAL HIGH (ref 4.0–10.5)
nRBC: 0.1 % (ref 0.0–0.2)

## 2020-06-19 LAB — ECHOCARDIOGRAM COMPLETE
Area-P 1/2: 5.27 cm2
Calc EF: 42.5 %
Height: 63 in
S' Lateral: 3.1 cm
Single Plane A2C EF: 35.9 %
Single Plane A4C EF: 38.5 %
Weight: 1918.88 oz

## 2020-06-19 LAB — PATHOLOGIST SMEAR REVIEW

## 2020-06-19 LAB — MAGNESIUM: Magnesium: 1.9 mg/dL (ref 1.7–2.4)

## 2020-06-19 LAB — HEPARIN LEVEL (UNFRACTIONATED): Heparin Unfractionated: 0.3 IU/mL (ref 0.30–0.70)

## 2020-06-19 MED ORDER — SODIUM CHLORIDE 0.9 % IV SOLN: INTRAVENOUS | Status: DC

## 2020-06-19 MED ORDER — POTASSIUM CHLORIDE CRYS ER 20 MEQ PO TBCR
40.0000 meq | EXTENDED_RELEASE_TABLET | ORAL | Status: AC
  Administered 2020-06-19 (×3): 40 meq via ORAL
  Filled 2020-06-19 (×3): qty 2

## 2020-06-19 MED ORDER — APIXABAN 5 MG PO TABS
5.0000 mg | ORAL_TABLET | Freq: Two times a day (BID) | ORAL | Status: DC
  Administered 2020-06-19 – 2020-06-25 (×13): 5 mg via ORAL
  Filled 2020-06-19 (×6): qty 1
  Filled 2020-06-19: qty 2
  Filled 2020-06-19 (×6): qty 1

## 2020-06-19 NOTE — Progress Notes (Signed)
   06/19/20 1944  Assess: MEWS Score  Temp 97.8 F (36.6 C)  BP 105/61  Pulse Rate (!) 117  ECG Heart Rate (!) 127  Resp 18  Level of Consciousness Alert  SpO2 93 %  O2 Device Nasal Cannula  Patient Activity (if Appropriate) In bed  O2 Flow Rate (L/min) 6 L/min  Assess: MEWS Score  MEWS Temp 0  MEWS Systolic 0  MEWS Pulse 2  MEWS RR 0  MEWS LOC 0  MEWS Score 2  MEWS Score Color Yellow  Assess: if the MEWS score is Yellow or Red  Were vital signs taken at a resting state? Yes  Focused Assessment No change from prior assessment  Early Detection of Sepsis Score *See Row Information* Low  MEWS guidelines implemented *See Row Information* Yes  Treat  Pain Scale 0-10  Pain Score 0  Take Vital Signs  Increase Vital Sign Frequency  Yellow: Q 2hr X 2 then Q 4hr X 2, if remains yellow, continue Q 4hrs  Escalate  MEWS: Escalate Yellow: discuss with charge nurse/RN and consider discussing with provider and RRT  Notify: Charge Nurse/RN  Name of Charge Nurse/RN Notified Karlene, RN  Date Charge Nurse/RN Notified 06/19/20  Time Charge Nurse/RN Notified 1945  Document  Patient Outcome Other (Comment) (stable)  Progress note created (see row info) Yes

## 2020-06-19 NOTE — Progress Notes (Addendum)
PROGRESS NOTE    Crystal [name]  [id number] DOB: [birth year] DOA: 06/29/2020 PCP: [name], MD   Brief Narrative:  HPI: Crystal [name] is a 75 y.o. female with medical history significant of afib, s/p cardioversion about 2 years ago, no longer on anticoagulation, IBS, IPF, hypertension, hyperlipidemia and peripheral vascular disease came to ED with generalized weakness and palpitations.  According to patient and her husband she developed palpitations yesterday, husband noticed high heart rate and gave her his metoprolol that help with the heart rate but dropped her blood pressure.  She came to ED for evaluation and then left without being seen due to long wait time.  This morning she was feeling more weak and continue to have palpitations, talked with her PCP who also evaluated her labs drawn while she was here yesterday which shows mildly elevated troponin and AKI and advised her to come back to ED for further evaluation.  Patient denies any chest pain or shortness of breath.  No orthopnea or PND.  Denies any upper respiratory symptoms or sick contacts.  No current nausea, vomiting or diarrhea.  Apparently she had an history of IBS and had nausea, vomiting and diarrhea couple of weeks ago and received a course of Augmentin for possible diverticulitis.  Symptoms has been resolved since then.  No fever or chills.  No urinary symptoms.  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  He was not on any anticoagulation for a long time now stating that she was experiencing recurrent GI bleed.  Did not had A. fib since she had her cardioversion approximately 2 years ago.  ED Course: On arrival she was found to be in A. fib/flutter with RVR, labs pertinent for leukocytosis of 22, sodium of 132, potassium of 3.2, creatinine of 1.95, troponin of 42>>53, lactic acid of 2.3, COVID-19 test pending, chest x-ray with small bilateral pleural effusions, left greater than right, left base  atelectasis/infiltrate.  Assessment & Plan:   Active Problems:   Atrial fibrillation with rapid ventricular response (HCC)   A. fib with RVR.  Patient had a prior history of paroxysmal atrial fibrillation and a prior cardioversion.  Not on any chronic anticoagulation due to GI bleeds.  Cardiology was consulted from ED and they recommend starting her on Cardizem and heparin infusions.  Rates controlled.  I just discussed with Dr. Gwenlyn Found who is going to see this patient.  Elevated troponin: Slightly elevated but flat troponin without having chest pain or shortness of breath does not indicate ACS and instead possible demand ischemia.  Echo pending.  Chronic leukocytosis: Chest x-ray with concern of left base infiltrate, patient afebrile and no upper respiratory symptoms so doubt pneumonia.  Minimally elevated procalcitonin.  Watch off of antibiotics.  Lactic acidosis.  Most likely secondary to hypoxia with A. fib with  RVR.We will recheck again today.  AKI.  Creatinine at 1.95 with baseline below 1.  Holding stable at 1.9.  Avoid nephrotoxic agents. Monitor daily.  Hypokalemia.  Down to 2.8 today.  Will replace orally.  Pleural effusion.  Chest x-ray concerning for bilateral pleural effusion left more than right.  No prior diagnosis of heart failure but can be due to CHF in the setting of A. fib with RVR.  Clinically appears volume overloaded.  BNP elevated.  Echo done but results pending.  Continue Lasix 40 mg IV daily.  Strict I's and O's.  History of IBS.  Patient do get flares, no acute concern.  Most recent flare was approximately 2 weeks ago. Continue to monitor  Acute hyponatremia: Sodium dropped from 129 to 125.  Likely error.  We will recheck sodium.  DVT prophylaxis: Heparin drip.   Code Status: DNR  Family Communication:  None present at bedside.  Plan of care discussed with patient in length and he verbalized understanding and agreed with it.  Status is: Observation  The  patient will require care spanning > 2 midnights and should be moved to inpatient because: Inpatient level of care appropriate due to severity of illness  Dispo: The patient is from: Home              Anticipated d/c is to: Home              Anticipated d/c date is: 1 day              Patient currently is not medically stable to d/c.        Estimated body mass index is 21.24 kg/m as calculated from the following:   Height as of this encounter: 5\' 3"  (1.6 m).   Weight as of this encounter: 54.4 kg.      Nutritional status:               Consultants:   Cardiology  Procedures:   None  Antimicrobials:  Anti-infectives (From admission, onward)   None         Subjective: Seen and examined.  She has no complaints.  She denied any palpitation, chest pain or shortness of breath.  Objective: Vitals:   06/24/2020 2300 06/19/20 0300 06/19/20 0751 06/19/20 1050  BP: 103/63 (!) 96/57 (!) 121/51 100/84  Pulse: 100 (!) 129 96   Resp: 14 16 19 19   Temp: 97.9 F (36.6 C) 98.6 F (37 C)  (!) 97.5 F (36.4 C)  TempSrc: Oral Oral  Oral  SpO2: 92% 98% 93% 98%  Weight:      Height:        Intake/Output Summary (Last 24 hours) at 06/19/2020 1217 Last data filed at 06/19/2020 0500 Gross per 24 hour  Intake 397.94 ml  Output --  Net 397.94 ml   Filed Weights   06/19/2020 1109  Weight: 54.4 kg    Examination:  General exam: Appears calm and comfortable, legally blind Respiratory system: Diminished breath sounds at the bases bilaterally. Respiratory effort normal. Cardiovascular system: S1 & S2 heard, irregularly irregular rate and rhythm. No JVD, murmurs, rubs, gallops or clicks.  +2 pitting edema right lower extremity and +3 pitting edema left lower extremity. Gastrointestinal system: Abdomen is nondistended, soft and nontender. No organomegaly or masses felt. Normal bowel sounds heard. Central nervous system: Alert and oriented. No focal neurological  deficits. Extremities: Symmetric 5 x 5 power. Skin: No rashes, lesions or ulcers Psychiatry: Judgement and insight appear normal. Mood & affect appropriate.    Data Reviewed: I have personally reviewed following labs and imaging studies  CBC: Recent Labs  Lab 06/17/20 1607 07/05/2020 1115 06/19/20 0046  WBC 15.6* 22.0* 20.5*  NEUTROABS  --  16.0*  --   HGB 11.1* 11.2* 10.3*  HCT 37.2 40.3 35.8*  MCV 84.5 86.5 84.2  PLT 687* 544* 0000000*   Basic Metabolic Panel: Recent Labs  Lab 06/17/20 1607 06/26/2020 1121 06/19/20 0046 06/19/20 0846  NA 131* 132* 125*  --   K 3.3* 3.2* 2.8*  --   CL 94* 94* 92*  --   CO2 21* 22 17*  --  GLUCOSE 137* 85 70  --   BUN 12 15 16   --   CREATININE 1.26* 1.95* 1.99*  --   CALCIUM 8.5* 8.6* 7.6*  --   MG  --  1.7  --  1.9   GFR: Estimated Creatinine Clearance: 19.9 mL/min (A) (by C-G formula based on SCr of 1.99 mg/dL (H)). Liver Function Tests: Recent Labs  Lab 07/02/2020 1121  AST 35  ALT 16  ALKPHOS 136*  BILITOT 1.4*  PROT 5.8*  ALBUMIN 3.4*   No results for input(s): LIPASE, AMYLASE in the last 168 hours. No results for input(s): AMMONIA in the last 168 hours. Coagulation Profile: No results for input(s): INR, PROTIME in the last 168 hours. Cardiac Enzymes: No results for input(s): CKTOTAL, CKMB, CKMBINDEX, TROPONINI in the last 168 hours. BNP (last 3 results) No results for input(s): PROBNP in the last 8760 hours. HbA1C: No results for input(s): HGBA1C in the last 72 hours. CBG: No results for input(s): GLUCAP in the last 168 hours. Lipid Profile: No results for input(s): CHOL, HDL, LDLCALC, TRIG, CHOLHDL, LDLDIRECT in the last 72 hours. Thyroid Function Tests: Recent Labs    06/19/2020 1818 07/01/2020 2148  TSH 3.778  --   FREET4  --  1.09   Anemia Panel: No results for input(s): VITAMINB12, FOLATE, FERRITIN, TIBC, IRON, RETICCTPCT in the last 72 hours. Sepsis Labs: Recent Labs  Lab 07/11/2020 1124 06/24/2020 1401  06/14/2020 1818  PROCALCITON 0.21  --   --   LATICACIDVEN  --  2.3* 2.0*    Recent Results (from the past 240 hour(s))  Culture, blood (routine x 2)     Status: None (Preliminary result)   Collection Time: 07/05/2020  2:01 PM   Specimen: BLOOD  Result Value Ref Range Status   Specimen Description BLOOD LEFT ANTECUBITAL  Final   Special Requests   Final    BOTTLES DRAWN AEROBIC AND ANAEROBIC Blood Culture adequate volume   Culture   Final    NO GROWTH < 24 HOURS Performed at [facility], [address], [place], [place]    Report Status PENDING  Incomplete  Resp Panel by RT-PCR (Flu A&B, Covid) Nasopharyngeal Swab     Status: None   Collection Time: 06/17/2020  6:28 PM   Specimen: Nasopharyngeal Swab; Nasopharyngeal(NP) swabs in vial transport medium  Result Value Ref Range Status   SARS Coronavirus 2 by RT PCR NEGATIVE NEGATIVE Final    Comment: (NOTE) SARS-CoV-2 target nucleic acids are NOT DETECTED.  The SARS-CoV-2 RNA is generally detectable in upper respiratory specimens during the acute phase of infection. The lowest concentration of SARS-CoV-2 viral copies this assay can detect is 138 copies/mL. A negative result does not preclude SARS-Cov-2 infection and should not be used as the sole basis for treatment or other patient management decisions. A negative result may occur with  improper specimen collection/handling, submission of specimen other than nasopharyngeal swab, presence of viral mutation(s) within the areas targeted by this assay, and inadequate number of viral copies(<138 copies/mL). A negative result must be combined with clinical observations, patient history, and epidemiological information. The expected result is Negative.  Fact Sheet for Patients:  08/16/20  Fact Sheet for Healthcare Providers:  BloggerCourse.com  This test is no t yet approved or cleared by the [employer] FDA and   has been authorized for detection and/or diagnosis of SARS-CoV-2 by FDA under an Emergency Use Authorization (EUA). This EUA will remain  in effect (meaning this  test can be used) for the duration of the COVID-19 declaration under Section 564(b)(1) of the Act, 21 U.S.C.section 360bbb-3(b)(1), unless the authorization is terminated  or revoked sooner.       Influenza A by PCR NEGATIVE NEGATIVE Final   Influenza B by PCR NEGATIVE NEGATIVE Final    Comment: (NOTE) The Xpert Xpress SARS-CoV-2/FLU/RSV plus assay is intended as an aid in the diagnosis of influenza from Nasopharyngeal swab specimens and should not be used as a sole basis for treatment. Nasal washings and aspirates are unacceptable for Xpert Xpress SARS-CoV-2/FLU/RSV testing.  Fact Sheet for Patients: EntrepreneurPulse.com.au  Fact Sheet for Healthcare Providers: IncredibleEmployment.be  This test is not yet approved or cleared by the [place] FDA and has been authorized for detection and/or diagnosis of SARS-CoV-2 by FDA under an Emergency Use Authorization (EUA). This EUA will remain in effect (meaning this test can be used) for the duration of the COVID-19 declaration under Section 564(b)(1) of the Act, 21 U.S.C. section 360bbb-3(b)(1), unless the authorization is terminated or revoked.  Performed at [facility], [place] [address] [place], [place] [address]   Culture, blood (routine x 2)     Status: None (Preliminary result)   Collection Time: 06/28/2020  7:44 PM   Specimen: BLOOD  Result Value Ref Range Status   Specimen Description BLOOD LEFT ANTECUBITAL  Final   Special Requests   Final    BOTTLES DRAWN AEROBIC AND ANAEROBIC Blood Culture adequate volume   Culture   Final    NO GROWTH < 24 HOURS Performed at [facility], [place] [address], [place], [place] [address]    Report Status PENDING  Incomplete  MRSA PCR Screening     Status: None   Collection  Time: 07/06/2020 10:00 PM   Specimen: Nasal Mucosa; Nasopharyngeal  Result Value Ref Range Status   MRSA by PCR NEGATIVE NEGATIVE Final    Comment:        The GeneXpert MRSA Assay (FDA approved for NASAL specimens only), is one component of a comprehensive MRSA colonization surveillance program. It is not intended to diagnose MRSA infection nor to guide or monitor treatment for MRSA infections. Performed at [facility], [place] [address] [place], [place] [address]       Radiology Studies: DG Chest 2 View  Result Date: 06/17/2020 CLINICAL DATA:  Chest pain EXAM: CHEST - 2 VIEW COMPARISON:  05/26/2020 chest radiograph. FINDINGS: Stable cardiomediastinal silhouette with mild cardiomegaly. No pneumothorax. Small bilateral pleural effusions, left greater than right. Cephalization of the pulmonary vasculature without overt pulmonary edema. Dense left lung base consolidation. IMPRESSION: 1. Small bilateral pleural effusions, left greater than right. 2. Mild cardiomegaly without overt pulmonary edema. 3. Dense left lung base consolidation, which could represent atelectasis, aspiration or pneumonia. Chest radiograph follow-up advised. Electronically Signed   By: Ilona Sorrel M.D.   On: 06/17/2020 16:19   DG Chest Port 1 View  Result Date: 06/17/2020 CLINICAL DATA:  Sepsis.  Chest pain. EXAM: PORTABLE CHEST 1 VIEW COMPARISON:  06/17/2020. FINDINGS: Mediastinum and hilar structures normal. Cardiomegaly. Interim significant clearing of bilateral interstitial prominence suggesting clearing CHF. Mild residual. Persistent left base atelectasis/infiltrate. Interim near complete clearing of right pleural effusion. Persistent moderate left-sided pleural effusion. No pneumothorax. IMPRESSION: 1. Cardiomegaly. Interim significant clearing of bilateral interstitial prominence suggesting clearing CHF. Mild residual. 2. Persistent left base atelectasis/infiltrate and moderate left pleural effusion.  Electronically Signed   By: Marcello Moores  Register   On: 07/09/2020  13:44    Scheduled Meds: . furosemide  40 mg Intravenous Daily  . gabapentin  600 mg Oral QHS  . levothyroxine  75 mcg Oral Q0600  . LORazepam  1 mg Oral QHS  . montelukast  10 mg Oral QHS  . potassium chloride  40 mEq Oral Q4H  . theophylline  200 mg Oral BID   Continuous Infusions: . diltiazem (CARDIZEM) infusion 10 mg/hr (06/21/2020 1522)  . heparin 950 Units/hr (06/19/20 0953)  . lactated ringers       LOS: 0 days   Time spent: 35 minutes   Darliss Cheney, MD [facility]  06/19/2020, 12:17 PM   To contact the attending provider between 7A-7P or the covering provider during after hours 7P-7A, please log into the web site [email or web address].

## 2020-06-19 NOTE — Progress Notes (Signed)
ANTICOAGULATION CONSULT NOTE  Pharmacy Consult for heparin Indication: atrial fibrillation  Allergies  Allergen Reactions  . Simvastatin Other (See Comments)    myalgias    Patient Measurements: Height: 5\' 3"  (160 cm) Weight: 54.4 kg (119 lb 14.9 oz) IBW/kg (Calculated) : 52.4 Heparin Dosing Weight: TBW  Vital Signs: Temp: 98.6 F (37 C) (01/06 0300) Temp Source: Oral (01/06 0300) BP: 121/51 (01/06 0751) Pulse Rate: 96 (01/06 0751)  Labs: Recent Labs    06/17/20 1607 07/14/2020 1115 07/11/2020 1121 06/28/2020 1401 06/14/2020 2148 06/19/20 0046 06/19/20 0846  HGB 11.1* 11.2*  --   --   --  10.3*  --   HCT 37.2 40.3  --   --   --  35.8*  --   PLT 687* 544*  --   --   --  523*  --   HEPARINUNFRC  --   --   --   --  0.29*  --  0.30  CREATININE 1.26*  --  1.95*  --   --  1.99*  --   TROPONINIHS 38* 42*  --  53*  --   --   --     Estimated Creatinine Clearance: 19.9 mL/min (A) (by C-G formula based on SCr of 1.99 mg/dL (H)).   Medical History: Past Medical History:  Diagnosis Date  . Adenomatous colon polyp 2007 & 2010    Dr 2011  . Allergy    seasonal  . Arthritis   . Asthma   . Cataract   . Diverticulosis   . 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 Hyperlipidemia   . Hypertension   . IBS (irritable bowel syndrome)   . Legally blind   . MVP (mitral valve prolapse)   . Paroxysmal atrial fibrillation with rapid ventricular response (HCC) 08/29/2017  . Peripheral vascular disease (HCC)   . RAD (reactive airway disease)    Assessment: 78 YOF presenting with lethargy, N/V, hx of afib was previously on Eliquis however is no longer on anticoagulation PTA due to history of recurrent GIB.  - heparin level therapeutic at 0.4.  Goal of Therapy:  Heparin level 0.3-0.7 units/ml Monitor platelets by anticoagulation protocol: Yes   Plan:  Increase heparin to 950 units/h Daily heparin level and CBC  73, PharmD Clinical Pharmacist **Pharmacist  phone directory can now be found on [email or web address] ([email or web address]).  Listed under [facility].

## 2020-06-19 NOTE — Consult Note (Addendum)
Cardiology Consultation:   Patient ID: KEAIRRA ARTALE MRN: [id number]; DOB: 1945/10/02  Admit date: 06/28/2020 Date of Consult: 06/19/2020  Primary Care Provider: [name], MD [facility] Cardiologist: [name], MD  [place] Electrophysiologist:  None    Patient Profile:   Crystal [name] is a 75 y.o. 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 Doristine Bosworth.  History of Present Illness:   Crystal [name] is a 75 yo female with PMH noted above. She is followed by Dr. Stanford Breed as an outpatient. 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 Shortly afterwards developed leg pain 08/2017 and was found to be in atrial fibrillation. US showed thrombotic occlusion of her right SFA with reconstitution of popliteal artery above the knee, consistent with emboli. She was taken to the OR on 08/29/2017 for right femoral exploration and it was felt the SFA occlusion was chronic. She converted sinus rhythm spontaneously. Echo 04/2018 showed normal LV function, mild to moderate mitral regurgitation, moderate left atrial enlargement and mild to moderate mitral regurgitation. She fell in October of 2019 and sustained a subcapital fracture of the right hip. Was noted to be in atrial fib at that time and placed on amiodarone. Had a cardioversion 06/16/18. Seen for recurrent atrial fib 1/21 and placed back on amiodarone. 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 Last seen in the office on 10/2019. Denied any bleeding, chronic pedal edema. Her amiodarone was stopped and Cardizem increased to 180mg  daily. According to the patient, she had a conversation with Dr. Stanford Breed regarding possibly stopping her Eliquis as she had no recurrence of Afib and had some GI bleeding. She decided to stop. Has been off Eliquis around 6  months. Of note she does use home O2 intermittently, mostly at night if needed. Has also had chronic swelling in her LE for quite some time, but reports recently it seems to be worse and legs have been weeping at times.   Presented to the ED on 1/4 with complaints of generalized weakness and palpitations. Reportedly developed palpitations the day prior to admission. Husband noted her HR being elevated and gave her metoprolol but this then dropped her blood pressure. Then presented to the ED but then left without being seen 2/2 long wait times. She continued having palpitations and talked with her PCP who looked over her labs while in the ED and advised that she come back for evaluation because of elevated troponin and AKI. Presented back to the ED on 1/5.   In the ED she was noted to be in Aflutter RVR. Labs showed Na+ 131>>132>>125, K+ 3.3>>3.2>>2.8, Cr 1.2>>1.95>>1.99, WBC 15.6>>22.0>>20.5, Hgb 11.1>>10.3, Lactic Acid 2.3>>2.0, BNP 1162, hsTn 38>>42>>53. CXR with cardiomegaly with bilateral interstitial prominence suggesting clearing CHF, along with persistent left base atelectasis, moderate left pleural effusion. COVID negative. She was admitted and placed on a Cardizem drip/heparin as well as IV lasix for diuresis.    Past Medical History:  Diagnosis Date  . Adenomatous colon polyp 2007 & 2010    Dr [name]  . Allergy    seasonal  . Arthritis   . Asthma   . Cataract   . Diverticulosis   . 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 Hyperlipidemia   . Hypertension   . IBS (  irritable bowel syndrome)   . Legally blind   . MVP (mitral valve prolapse)   . Paroxysmal atrial fibrillation with rapid ventricular response ([place]) 08/29/2017  . Peripheral vascular disease ([place])   . RAD (reactive airway disease)     Past Surgical History:  Procedure Laterality Date  . ABDOMINAL HYSTERECTOMY     with USO for dysfunctionall menses  . APPENDECTOMY    . ARTERY BIOPSY Bilateral 07/21/2017    Procedure: BIOPSY BILATERAL TEMPORAL ARTERY;  Surgeon: [name], MD;  Location: [facility];  Service: Vascular;  Laterality: Bilateral;  . CARDIOVERSION N/A 06/16/2018   Procedure: CARDIOVERSION;  Surgeon: [name], MD;  Location: [place];  Service: Cardiovascular;  Laterality: N/A;  . CATARACT EXTRACTION W/ INTRAOCULAR LENS IMPLANT  2013   bilateral; Dr [name]  . COLONOSCOPY  2013   negative , due 2018; Dr [name]  . COLONOSCOPY W/ POLYPECTOMY      X2 ; diverticulosis  . EMBOLECTOMY Right 08/29/2017   Procedure: Right Groin Exploration  ;  Surgeon: Rosetta Posner, MD;  Location: [employer];  Service: Vascular;  Laterality: Right;  . HIP PINNING,CANNULATED Right 03/21/2018  . HIP PINNING,CANNULATED Right 03/21/2018   Procedure: CANNULATED HIP PINNING;  Surgeon: Rod Can, MD;  Location: [place];  Service: Orthopedics;  Laterality: Right;  . LUMBAR LAMINECTOMY  2000   Dr Vertell Limber  . SHOULDER SURGERY     R shoulder  . TONSILLECTOMY       Home Medications:  Prior to Admission medications   Medication Sig Start Date End Date Taking? Authorizing Provider  albuterol (VENTOLIN HFA) 108 (90 Base) MCG/ACT inhaler INHALE ONE PUFF BY MOUTH TWICE A DAY AS NEEDED Patient taking differently: Inhale 1-2 puffs into the lungs every 6 (six) hours as needed for wheezing. 05/01/20  Yes Burns, Claudina Lick, MD  Calcium Citrate-Vitamin D (CALCIUM + D PO) Take 1 tablet by mouth daily.   Yes [provider]  diphenoxylate-atropine (LOMOTIL) 2.5-0.025 MG tablet TAKE ONE TABLET BY MOUTH FOUR TIMES A DAY AS NEEDED FOR DIARRHEA OR LOOSE STOOLS 06/16/20  Yes Burns, Claudina Lick, MD  furosemide (LASIX) 40 MG tablet TAKE ONE TABLET BY MOUTH DAILY 02/04/20  Yes Burns, Claudina Lick, MD  gabapentin (NEURONTIN) 300 MG capsule Take 2 capsules (600 mg total) by mouth at bedtime. 09/21/19  Yes Burns, Claudina Lick, MD  KLOR-CON M20 20 MEQ tablet TAKE TWO TABLETS BY MOUTH DAILY Patient taking differently: Take 20 mEq by mouth  daily. 02/04/20  Yes Burns, Claudina Lick, MD  levothyroxine (SYNTHROID) 75 MCG tablet Take 1 tablet (75 mcg total) by mouth daily. 01/16/20  Yes Burns, Claudina Lick, MD  LORazepam (ATIVAN) 1 MG tablet TAKE ONE TABLET BY MOUTH EVERY NIGHT AT BEDTIME Patient taking differently: Take 1 mg by mouth at bedtime. 05/13/20  Yes Burns, Claudina Lick, MD  montelukast (SINGULAIR) 10 MG tablet TAKE ONE TABLET BY MOUTH EVERY NIGHT AT BEDTIME Patient taking differently: Take 10 mg by mouth at bedtime. 03/24/20  Yes Burns, Claudina Lick, MD  predniSONE (DELTASONE) 5 MG tablet Take 5 mg by mouth daily. 03/12/20  Yes [provider]  SYMBICORT 160-4.5 MCG/ACT inhaler INHALE 1 TO 2 PUFFS BY MOUTH EVERY 12 HOURS THEN GARGLE AND SPIT AFTER USE Patient taking differently: Inhale 2 puffs into the lungs 2 times daily at 12 noon and 4 pm. 01/21/20  Yes Burns, Claudina Lick, MD  theophylline (UNIPHYL) 400 MG 24 hr tablet TAKE 1/2 TABLET BY MOUTH  TWO TIMES A DAY Patient taking differently: Take 200 mg by mouth in the morning and at bedtime. 01/14/20  Yes Burns, Claudina Lick, MD  traMADol (ULTRAM) 50 MG tablet TAKE ONE TABLET BY MOUTH EVERY 8 HOURS AS NEEDED FOR MODERATE PAIN Patient taking differently: Take 50 mg by mouth 3 (three) times daily as needed for moderate pain. 05/21/20  Yes Burns, Claudina Lick, MD  allopurinol (ZYLOPRIM) 300 MG tablet Take 1 tablet (300 mg total) by mouth daily. Patient not taking: No sig reported 04/09/20   [name], MD  amoxicillin-clavulanate (AUGMENTIN) 875-125 MG tablet Take 1 tablet by mouth 2 (two) times daily. Patient not taking: No sig reported 05/30/20   [name], MD  Cholecalciferol (VITAMIN D3) 1000 units CAPS Take 1 capsule (1,000 Units total) by mouth daily with lunch. Patient not taking: No sig reported 03/30/18   Angiulli, Lavon Paganini, PA-C  diclofenac Sodium (VOLTAREN) 1 % GEL Apply 2 g topically 2 (two) times daily as needed (back pain).    [provider]  diltiazem (CARDIZEM CD) 180 MG 24 hr  capsule Take 1 capsule (180 mg total) by mouth daily. 10/29/19 01/27/20  [name], MD  methocarbamol (ROBAXIN) 500 MG tablet TAKE ONE TABLET BY MOUTH EVERY 6 HOURS AS NEEDED FOR MUSCLE SPASMS Patient taking differently: Take 500 mg by mouth every 6 (six) hours as needed for muscle spasms. 03/03/20   [name], MD  ondansetron (ZOFRAN) 4 MG tablet Take 1 tablet (4 mg total) by mouth every 8 (eight) hours as needed for nausea or vomiting. 06/16/20   [name], MD    Inpatient Medications: Scheduled Meds: . furosemide  40 mg Intravenous Daily  . gabapentin  600 mg Oral QHS  . levothyroxine  75 mcg Oral Q0600  . LORazepam  1 mg Oral QHS  . montelukast  10 mg Oral QHS  . potassium chloride  40 mEq Oral Q4H  . theophylline  200 mg Oral BID   Continuous Infusions: . diltiazem (CARDIZEM) infusion 10 mg/hr (07/13/2020 1522)  . heparin 950 Units/hr (06/19/20 0953)  . lactated ringers     PRN Meds: acetaminophen, ondansetron (ZOFRAN) IV, traMADol  Allergies:    Allergies  Allergen Reactions  . Simvastatin Other (See Comments)    myalgias    Social History:   Social History   Socioeconomic History  . Marital status: Married    Spouse name: Not on file  . Number of children: Not on file  . Years of education: Not on file  . Highest education level: Not on file  Occupational History  . Occupation: retired  Tobacco Use  . Smoking status: Former Smoker    Packs/day: 1.00    Years: 23.00    Pack years: 23.00    Quit date: 06/14/1989    Years since quitting: 31.0  . Smokeless tobacco: Never Used  . Tobacco comment: smoked 1961-1991 , up to < 1 ppd  Vaping Use  . Vaping Use: Never used  Substance and Sexual Activity  . Alcohol use: Yes    Alcohol/week: 14.0 standard drinks    Types: 14 Glasses of wine per week    Comment: Wine  . Drug use: No  . Sexual activity: Not on file  Other Topics Concern  . Not on file  Social History Narrative  . Not on file   Social  Determinants of Health   Financial Resource Strain: Low Risk   . Difficulty of Paying  Living Expenses: Not hard at all  Food Insecurity: Not on file  Transportation Needs: Not on file  Physical Activity: Not on file  Stress: Not on file  Social Connections: Not on file  Intimate Partner Violence: Not on file    Family History:    Family History  Problem Relation Age of Onset  . Heart attack Brother 34  . Parkinsonism Mother   . Stroke Mother   . Heart attack Father        pre 33  . Diabetes Sister   . COPD Sister        X 3  . Other Son        suicide 2016  . Colon cancer Neg Hx   . Cancer Neg Hx   . Esophageal cancer Neg Hx   . Liver cancer Neg Hx   . Pancreatic cancer Neg Hx   . Rectal cancer Neg Hx   . Stomach cancer Neg Hx      ROS:  Please see the history of present illness.   All other ROS reviewed and negative.     Physical Exam/Data:   Vitals:   06/23/2020 2300 06/19/20 0300 06/19/20 0751 06/19/20 1050  BP: 103/63 (!) 96/57 (!) 121/51 100/84  Pulse: 100 (!) 129 96   Resp: 14 16 19 19   Temp: 97.9 F (36.6 C) 98.6 F (37 C)  (!) 97.5 F (36.4 C)  TempSrc: Oral Oral  Oral  SpO2: 92% 98% 93% 98%  Weight:      Height:        Intake/Output Summary (Last 24 hours) at 06/19/2020 1238 Last data filed at 06/19/2020 0500 Gross per 24 hour  Intake 397.94 ml  Output -  Net 397.94 ml   Last 3 Weights 07/07/2020 05/26/2020 02/06/2020  Weight (lbs) 119 lb 14.9 oz 120 lb 128 lb  Weight (kg) 54.4 kg 54.432 kg 58.06 kg     Body mass index is 21.24 kg/m.  General:  Well nourished, well developed, in no acute distress. Sitting up in bed wearing [place].  HEENT: normal Lymph: no adenopathy Neck: no JVD Endocrine:  No thryomegaly Vascular: No carotid bruits; FA pulses 2+ bilaterally without bruits  Cardiac:  normal S1, S2; Irreg Irreg; no murmur  Lungs:  Diminished in bases Abd: soft, nontender, no hepatomegaly  Ext: 2+ bilateral LE edema  Musculoskeletal:  No  deformities, BUE and BLE strength normal and equal Skin: warm and dry  Neuro:  CNs 2-12 intact, no focal abnormalities noted Psych:  Normal affect   EKG:  The EKG was personally reviewed and demonstrates:  Atrial flutter 2:1  Relevant CV Studies:  Echo: 06/19/20  IMPRESSIONS    1. There is significant change since the study on 05/02/2018. LVEF has  decreased from 55-60% to 35-40% with diffuse hypokinesis. RV is newly  severely dilated with severe systolic dysfunction, severe tricuspid  regurgitation and moderate pulmonary  hypertension RVSP 48 mmHg. Large left pleural effusion.  2. Left ventricular ejection fraction, by estimation, is 35 to 40%. The  left ventricle has moderately decreased function. The left ventricle has  no regional wall motion abnormalities. There is mild concentric left  ventricular hypertrophy. Left  ventricular diastolic function could not be evaluated. There is the  interventricular septum is flattened in systole and diastole, consistent  with right ventricular pressure and volume overload.  3. Right ventricular systolic function is severely reduced. The right  ventricular size is severely enlarged. There is moderately elevated  pulmonary artery systolic pressure. The estimated right ventricular  systolic pressure is 99991111 mmHg. m 4. Right atrial size was moderately dilated.  5. Large pleural effusion in the left lateral region.  6. The mitral valve is normal in structure. Moderate mitral valve  regurgitation. No evidence of mitral stenosis.  7. Tricuspid valve regurgitation is severe.  8. The aortic valve is normal in structure. Aortic valve regurgitation is  not visualized. No aortic stenosis is present.  9. The inferior vena cava is dilated in size with <50% respiratory  variability, suggesting right atrial pressure of 15 mmHg.   Laboratory Data:  High Sensitivity Troponin:   Recent Labs  Lab 06/17/20 1607 06/15/2020 1115 07/02/2020 1401   TROPONINIHS 38* 42* 53*     Chemistry Recent Labs  Lab 06/17/20 1607 07/04/2020 1121 06/19/20 0046  NA 131* 132* 125*  K 3.3* 3.2* 2.8*  CL 94* 94* 92*  CO2 21* 22 17*  GLUCOSE 137* 85 70  BUN 12 15 16   CREATININE 1.26* 1.95* 1.99*  CALCIUM 8.5* 8.6* 7.6*  GFRNONAA 44* 26* 26*  ANIONGAP 16* 16* 16*    Recent Labs  Lab 06/23/2020 1121  PROT 5.8*  ALBUMIN 3.4*  AST 35  ALT 16  ALKPHOS 136*  BILITOT 1.4*   Hematology Recent Labs  Lab 06/17/20 1607 06/22/2020 1115 06/19/20 0046  WBC 15.6* 22.0* 20.5*  RBC 4.40 4.66 4.25  HGB 11.1* 11.2* 10.3*  HCT 37.2 40.3 35.8*  MCV 84.5 86.5 84.2  MCH 25.2* 24.0* 24.2*  MCHC 29.8* 27.8* 28.8*  RDW 21.5* 21.0* 20.7*  PLT 687* 544* 523*   BNP Recent Labs  Lab 07/14/2020 1124  BNP 1,162.3*    DDimer No results for input(s): DDIMER in the last 168 hours.   Radiology/Studies:  DG Chest 2 View  Result Date: 06/17/2020 CLINICAL DATA:  Chest pain EXAM: CHEST - 2 VIEW COMPARISON:  05/26/2020 chest radiograph. FINDINGS: Stable cardiomediastinal silhouette with mild cardiomegaly. No pneumothorax. Small bilateral pleural effusions, left greater than right. Cephalization of the pulmonary vasculature without overt pulmonary edema. Dense left lung base consolidation. IMPRESSION: 1. Small bilateral pleural effusions, left greater than right. 2. Mild cardiomegaly without overt pulmonary edema. 3. Dense left lung base consolidation, which could represent atelectasis, aspiration or pneumonia. Chest radiograph follow-up advised. Electronically Signed   By: Ilona Sorrel M.D.   On: 06/17/2020 16:19   DG Chest Port 1 View  Result Date: 06/15/2020 CLINICAL DATA:  Sepsis.  Chest pain. EXAM: PORTABLE CHEST 1 VIEW COMPARISON:  06/17/2020. FINDINGS: Mediastinum and hilar structures normal. Cardiomegaly. Interim significant clearing of bilateral interstitial prominence suggesting clearing CHF. Mild residual. Persistent left base atelectasis/infiltrate. Interim  near complete clearing of right pleural effusion. Persistent moderate left-sided pleural effusion. No pneumothorax. IMPRESSION: 1. Cardiomegaly. Interim significant clearing of bilateral interstitial prominence suggesting clearing CHF. Mild residual. 2. Persistent left base atelectasis/infiltrate and moderate left pleural effusion. Electronically Signed   By: Marcello Moores  Register   On: 07/01/2020 13:44   ECHOCARDIOGRAM COMPLETE  Result Date: 06/19/2020    ECHOCARDIOGRAM REPORT   Patient Name:   Crystal [name] Date of Exam: 06/19/2020 Medical Rec #:  [id number]    Height:       63.0 in Accession #:    [id number]   Weight:       119.9 lb Date of Birth:  [birth year]     BSA:          1.556 m Patient Age:    66 years  BP:           121/51 mmHg Patient Gender: F            HR:           84 bpm. Exam Location:  Inpatient Procedure: 2D Echo, 3D Echo, Cardiac Doppler and Color Doppler Indications:    R94.31 Abnormal EKG  History:        Patient has prior history of Echocardiogram examinations, most                 recent 05/02/2018. Abnormal ECG, Arrythmias:Atrial Fibrillation;                 Risk Factors:Dyslipidemia and Hypertension. Elevated troponin.  Sonographer:    Roseanna Rainbow RDCS Referring Phys: [id number] [place]  1. There is significant change since the study on 05/02/2018. LVEF has decreased from 55-60% to 35-40% with diffuse hypokinesis. RV is newly severely dilated with severe systolic dysfunction, severe tricuspid regurgitation and moderate pulmonary hypertension RVSP 48 mmHg. Large left pleural effusion.  2. Left ventricular ejection fraction, by estimation, is 35 to 40%. The left ventricle has moderately decreased function. The left ventricle has no regional wall motion abnormalities. There is mild concentric left ventricular hypertrophy. Left ventricular diastolic function could not be evaluated. There is the interventricular septum is flattened in systole and diastole, consistent with right  ventricular pressure and volume overload.  3. Right ventricular systolic function is severely reduced. The right ventricular size is severely enlarged. There is moderately elevated pulmonary artery systolic pressure. The estimated right ventricular systolic pressure is 99991111 mmHg.  4. Right atrial size was moderately dilated.  5. Large pleural effusion in the left lateral region.  6. The mitral valve is normal in structure. Moderate mitral valve regurgitation. No evidence of mitral stenosis.  7. Tricuspid valve regurgitation is severe.  8. The aortic valve is normal in structure. Aortic valve regurgitation is not visualized. No aortic stenosis is present.  9. The inferior vena cava is dilated in size with <50% respiratory variability, suggesting right atrial pressure of 15 mmHg. FINDINGS  Left Ventricle: Left ventricular ejection fraction, by estimation, is 35 to 40%. The left ventricle has moderately decreased function. The left ventricle has no regional wall motion abnormalities. The left ventricular internal cavity size was normal in size. There is mild concentric left ventricular hypertrophy. The interventricular septum is flattened in systole and diastole, consistent with right ventricular pressure and volume overload. Left ventricular diastolic function could not be evaluated due to atrial fibrillation. Left ventricular diastolic function could not be evaluated. Right Ventricle: The right ventricular size is severely enlarged. No increase in right ventricular wall thickness. Right ventricular systolic function is severely reduced. There is moderately elevated pulmonary artery systolic pressure. The tricuspid regurgitant velocity is 2.85 m/s, and with an assumed right atrial pressure of 15 mmHg, the estimated right ventricular systolic pressure is 99991111 mmHg. Left Atrium: Left atrial size was normal in size. Right Atrium: Right atrial size was moderately dilated. Pericardium: There is no evidence of pericardial  effusion. Mitral Valve: The mitral valve is normal in structure. Moderate mitral valve regurgitation. No evidence of mitral valve stenosis. Tricuspid Valve: The tricuspid valve is normal in structure. Tricuspid valve regurgitation is severe. No evidence of tricuspid stenosis. Aortic Valve: The aortic valve is normal in structure. Aortic valve regurgitation is not visualized. No aortic stenosis is present. Pulmonic Valve: The pulmonic valve was normal in structure. Pulmonic valve regurgitation is not visualized.  No evidence of pulmonic stenosis. Aorta: The aortic root is normal in size and structure. Venous: The inferior vena cava is dilated in size with less than 50% respiratory variability, suggesting right atrial pressure of 15 mmHg. IAS/Shunts: No atrial level shunt detected by color flow Doppler. Additional Comments: There is a large pleural effusion in the left lateral region.  LEFT VENTRICLE PLAX 2D LVIDd:         3.60 cm LVIDs:         3.10 cm LV PW:         1.50 cm LV IVS:        1.10 cm LVOT diam:     1.70 cm LV SV:         39 LV SV Index:   25 LVOT Area:     2.27 cm  LV Volumes (MOD) LV vol d, MOD A2C: 40.1 ml LV vol d, MOD A4C: 37.4 ml LV vol s, MOD A2C: 25.7 ml LV vol s, MOD A4C: 23.0 ml LV SV MOD A2C:     14.4 ml LV SV MOD A4C:     37.4 ml LV SV MOD BP:      18.8 ml RIGHT VENTRICLE         IVC TAPSE (M-mode): 0.8 cm  IVC diam: 2.40 cm LEFT ATRIUM           Index       RIGHT ATRIUM           Index LA diam:      3.50 cm 2.25 cm/m  RA Area:     18.90 cm LA Vol (A2C): 20.3 ml 13.05 ml/m RA Volume:   58.30 ml  37.47 ml/m LA Vol (A4C): 35.0 ml 22.49 ml/m  AORTIC VALVE LVOT Vmax:   116.00 cm/s LVOT Vmean:  75.800 cm/s LVOT VTI:    0.172 m  AORTA Ao Root diam: 2.50 cm Ao Asc diam:  2.90 cm MITRAL VALVE               TRICUSPID VALVE MV Area (PHT): 5.27 cm    TR Peak grad:   32.5 mmHg MV Decel Time: 144 msec    TR Vmax:        285.00 cm/s MV E velocity: 55.27 cm/s                            SHUNTS                             Systemic VTI:  0.17 m                            Systemic Diam: 1.70 cm Ena Dawley MD Electronically signed by Ena Dawley MD Signature Date/Time: 06/19/2020/12:21:34 PM    Final      Assessment and Plan:   Crystal [name] is a 75 y.o. 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 Doristine Bosworth.  1. Atrial Flutter with RVR: developed palpitations prior to admission, attempted to take metoprolol but dropped her blood pressures. Hx of Afib, but has been off Eliquis per patient after discussion with Dr. Stanford Breed in the office with concern for GI bleeds. She does not generally know when/if  she has afib/flutter. 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 Started on IV Diltiazem and heparin on admission. Rates in the 90s currently. With decline in EF would benefit from restoring SR. This patients CHA2DS2-VASc Score of at least 5. -- will transition from IV heparin to Eliquis  -- plan for TEE/DCCV tomorrow. 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 Acute systolic HF with pleural effusions: Echo this admission with decline in EF from 55-60% to 35-40% with diffuse hypokinesis along with new severe RV dysfunction, severe TR and moderate pulmonary HTN with RVSP . CXR with small bilateral pleural effusions with left greater than right. 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 Chronic bilateral LE edema which she reports has been worse of the past couple of weeks. As above will plan for TEE/DCCV -- continue IV diuresis with aggressive electrolyte suppl -- with decline in EF would like to transition from Diltiazem to coreg  3. Leukocytosis: CXR with possible  left base infiltrate but she has been afebrile. Lactic trending down. Currently not treated with antibiotics. BC negative to date  4. Hypokalemia: Reports having several episodes of diarrhea likely 2/2 IBS flare last week. Also on daily lasix/K+ supplement at home.  -- replete   5. AKI: Cr baseline normal prior to admission. 1.26>>1.95>>1.99. Daily BMET with need for IV diuresis.   For questions or updates, please contact [facility] Please consult [email or web address] for contact info under    Signed, Laverda Page, NP  06/19/2020 12:38 PM   I have seen and examined the patient along with Laverda Page, NP .  I have reviewed the chart, notes and new data.  I agree with PA/NP's note.  Key new complaints: feels OK at rest. Rate is better controlled at rest (high 90s-110), but promptly develops RVR with minimal activity.  Key examination changes: prominent JVD to angle of jaw, holosystolic TR murmur, improving edema bilateral calves (developing wrinkles). Clear lung exam. Afebrile. Key new findings / data: creatinine 1.99 and K 2.8, elevated WBC (unclear etiology). Echo with new biventricular dysfunction and worsening of MR and TR, findings compatible with tachycardia cardiomyopathy.  PLAN: Rate control more challenging w AFlutter. Has severe consequences of tachyarrhythmia. Recommend TEE-cardioversion and she agrees. This procedure has been fully reviewed with the patient and written informed consent has been obtained. But we also need a long term antiarrhythmic plan. There is a question of amiodarone lung toxicity. Consider dofetilide after her renal function returns to baseline (or sotalol). Cannot use dronedarone or class I agents with systolic dysfunction. I wonder whether she would be considered a candidate for ablation. Will ask for EP opinion. Recommend Eliquis 5 mg BID for the first month after DCCV (assuming creatinine returns to normal, as expected). But beyond the first month, can  reduce the bruising complications by reducing the dose to 2.5 mg twice daily (she is <120 lb and almost 75 years old).  Thurmon Fair, MD, [facility] [phone] 06/19/2020, 2:42 PM

## 2020-06-19 NOTE — Progress Notes (Signed)
  Echocardiogram 2D Echocardiogram has been performed.  Crystal [name] 06/19/2020, 10:39 AM

## 2020-06-19 NOTE — Progress Notes (Addendum)
MEDICATION RELATED CONSULT NOTE  Pharmacy Consult to Review Medications for Drug Interactions with Dofetilide  Allergies  Allergen Reactions  . Simvastatin Other (See Comments)    myalgias    Patient Measurements: Height: 5\' 3"  (160 cm) Weight: 54.4 kg (119 lb 14.9 oz) IBW/kg (Calculated) : 52.4  Vital Signs: Temp: 97.7 F (36.5 C) (01/06 1558) Temp Source: Oral (01/06 1558) BP: 109/42 (01/06 1558) Pulse Rate: 96 (01/06 0751)  Labs: Recent Labs    06/17/20 1607 06/28/2020 1115 06/14/2020 1121 06/19/20 0046 06/19/20 0846  WBC 15.6* 22.0*  --  20.5*  --   HGB 11.1* 11.2*  --  10.3*  --   HCT 37.2 40.3  --  35.8*  --   PLT 687* 544*  --  523*  --   CREATININE 1.26*  --  1.95* 1.99*  --   MG  --   --  1.7  --  1.9  ALBUMIN  --   --  3.4*  --   --   PROT  --   --  5.8*  --   --   AST  --   --  35  --   --   ALT  --   --  16  --   --   ALKPHOS  --   --  136*  --   --   BILITOT  --   --  1.4*  --   --    Estimated Creatinine Clearance: 19.9 mL/min (A) (by C-G formula based on SCr of 1.99 mg/dL (H)).   Medical History: Past Medical History:  Diagnosis Date  . Adenomatous colon polyp 2007 & 2010    Dr 2011  . Allergy    seasonal  . Arthritis   . Asthma   . Cataract   . Diverticulosis   . 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 Hyperlipidemia   . Hypertension   . IBS (irritable bowel syndrome)   . Legally blind   . MVP (mitral valve prolapse)   . Paroxysmal atrial fibrillation with rapid ventricular response (HCC) 08/29/2017  . Peripheral vascular disease (HCC)   . RAD (reactive airway disease)     Assessment: 75 yr old woman admitted 07/04/2020 with generalized weakness and palpitations; has hx of afib with embolic event to RLE, diagnosed with a flutter with RVR this admission. Plans for TEE/DCCV tomorrow, with plans to start dofetilide in a couple of days once electrolytes and renal function allow. Pharmacy is consulted to review pt's current  medications for possible interaction with dofetilide.  Review of pt's current meds indicated the following medications with potential for interaction with dofetilide (reviewed information in [employer]): Furosemide - increased risk of cardiotoxicity when used concurrently - need to monitor/replete K, Mg when on concurrent dofetilide (major interaction) Ondansetron - increased risk of QTc prolongation - change to alternative anti-emetic if able (major interaction) Diltiazem - can increase blood levels of dofetilide if used concurrently (minor interaction)  Please contact Pharmacy with any questions regarding possible drug interactions with dofetilide.  08/16/20, PharmD, BCPS, [facility] Clinical Pharmacist 06/19/2020,5:00 PM

## 2020-06-19 NOTE — Plan of Care (Signed)

## 2020-06-19 NOTE — Plan of Care (Signed)
  Problem: Education: Goal: Knowledge of General Education information will improve Description: Including pain rating scale, medication(s)/side effects and non-pharmacologic comfort measures Outcome: Progressing   Problem: Health Behavior/Discharge Planning: Goal: Ability to manage health-related needs will improve Outcome: Progressing   Problem: Clinical Measurements: Goal: Ability to maintain clinical measurements within normal limits will improve Outcome: Progressing Goal: Will remain free from infection Outcome: Progressing Goal: Diagnostic test results will improve Outcome: Progressing Goal: Respiratory complications will improve Outcome: Progressing Goal: Cardiovascular complication will be avoided Outcome: Progressing   Problem: Activity: Goal: Risk for activity intolerance will decrease Outcome: Progressing   Problem: Pain Managment: Goal: General experience of comfort will improve Outcome: Progressing   Problem: Safety: Goal: Ability to remain free from injury will improve Outcome: Progressing   

## 2020-06-19 NOTE — Consult Note (Addendum)
Cardiology Consultation:   Patient ID: Crystal [name] MRN: [id number]; DOB: 11/30/1945  Admit date: 06/30/2020 Date of Consult: 06/19/2020  Primary Care Provider: [name], MD [facility] Cardiologist: [name], MD  [facility] Electrophysiologist:  None   Patient Profile:   Crystal [name] is a 75 y.o. 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 Sallyanne Kuster.  History of Present Illness:   Crystal [name] was came to the ER 06/17/20 with progressive weakness SOB, and palpitations the day or so pruior to admission, She took a dose of her husbands metoprolol prior to coming in for fast HR this resulted in lower BP and she came to the ER though the wait very long and wemt home.  With ongoing symptoms came back and was admitted 07/07/2020 She was found in AFlutter w/RVR and volume OL.  Started on dilt gtt, heparin gtt, and lasix IV. Cardiology elicited from the patient that she either self stopped or perhaps misunderstood instructions and stopped her eliquis 6 mo ago.  Heparin gtt has been stopped, eliquis started today with plans for TEE/DCCV tomorrow EP is asked to weigh on AAD options and recommendations  LABS K+ 3.3 > 3.2 > 2.8 (being replaced) BUN/Creat 12/1.25 > 15/1.95 > 16/1.99 (baseline 0.7-0.8) Mag 1.4 > 1.7 > 1.9 BNP 1162 HS Trop 38 > 42 >  WBC 15.6 > 22.0 > 20.5 (baseline looks 12-16) H/H 11/37  > 10.3/35.8 Plts 687 TSH 3.778  The patient mentions that she stopped the eliquis because she felt she was bruising too much, no clear hx of GIB is noted. She is  Feeling a bit better today   AFib  Diagnosed 2019  AAD Amiodarone started 2019 perhaps briefly > DCCV Jun 16, 2018 and  seems at some point amio stopped Jan 2021 recurrent AF, placed back on amio and DCCV, amio stopped May 2021 2/2  Poss pulm Fibrosis by CT   Past Medical History:  Diagnosis Date   Adenomatous colon polyp 2007 & 2010    Dr [name]   Allergy    seasonal   Arthritis    Asthma    Cataract    Diverticulosis    Femoral artery thrombosis, right ([place]) 09/01/2017   Hyperlipidemia    Hypertension    IBS (irritable bowel syndrome)    Legally blind    MVP (mitral valve prolapse)    Paroxysmal atrial fibrillation with rapid ventricular response ([place]) 08/29/2017   Peripheral vascular disease ([place])    RAD (reactive airway disease)     Past Surgical History:  Procedure Laterality Date   ABDOMINAL HYSTERECTOMY     with USO for dysfunctionall menses   APPENDECTOMY     ARTERY BIOPSY Bilateral 07/21/2017   Procedure: BIOPSY BILATERAL TEMPORAL ARTERY;  Surgeon: [name], MD;  Location: [place];  Service: Vascular;  Laterality: Bilateral;   CARDIOVERSION N/A 06/16/2018   Procedure: CARDIOVERSION;  Surgeon: [name], MD;  Location: [place];  Service: Cardiovascular;  Laterality: N/A;   CATARACT EXTRACTION W/ INTRAOCULAR LENS IMPLANT  2013   bilateral; Dr [name]   COLONOSCOPY  2013   negative , due 2018; Dr [name]  COLONOSCOPY W/ POLYPECTOMY      X2 ; diverticulosis   EMBOLECTOMY Right 08/29/2017   Procedure: Right Groin Exploration  ;  Surgeon: Rosetta Posner, MD;  Location: [place];  Service: Vascular;  Laterality: Right;   HIP PINNING,CANNULATED Right 03/21/2018   HIP PINNING,CANNULATED Right 03/21/2018   Procedure: CANNULATED HIP PINNING;  Surgeon: Rod Can, MD;  Location: [place];  Service: Orthopedics;  Laterality: Right;   LUMBAR LAMINECTOMY  2000   Dr Vertell Limber   SHOULDER SURGERY     R shoulder   TONSILLECTOMY       Home Medications:  Prior to Admission medications   Medication Sig Start Date End Date Taking? Authorizing Provider  albuterol (VENTOLIN HFA) 108 (90  Base) MCG/ACT inhaler INHALE ONE PUFF BY MOUTH TWICE A DAY AS NEEDED Patient taking differently: Inhale 1-2 puffs into the lungs every 6 (six) hours as needed for wheezing. 05/01/20  Yes Burns, Claudina Lick, MD  Calcium Citrate-Vitamin D (CALCIUM + D PO) Take 1 tablet by mouth daily.   Yes [provider]  diphenoxylate-atropine (LOMOTIL) 2.5-0.025 MG tablet TAKE ONE TABLET BY MOUTH FOUR TIMES A DAY AS NEEDED FOR DIARRHEA OR LOOSE STOOLS 06/16/20  Yes Burns, Claudina Lick, MD  furosemide (LASIX) 40 MG tablet TAKE ONE TABLET BY MOUTH DAILY 02/04/20  Yes Burns, Claudina Lick, MD  gabapentin (NEURONTIN) 300 MG capsule Take 2 capsules (600 mg total) by mouth at bedtime. 09/21/19  Yes Burns, Claudina Lick, MD  KLOR-CON M20 20 MEQ tablet TAKE TWO TABLETS BY MOUTH DAILY Patient taking differently: Take 20 mEq by mouth daily. 02/04/20  Yes Burns, Claudina Lick, MD  levothyroxine (SYNTHROID) 75 MCG tablet Take 1 tablet (75 mcg total) by mouth daily. 01/16/20  Yes Burns, Claudina Lick, MD  LORazepam (ATIVAN) 1 MG tablet TAKE ONE TABLET BY MOUTH EVERY NIGHT AT BEDTIME Patient taking differently: Take 1 mg by mouth at bedtime. 05/13/20  Yes Burns, Claudina Lick, MD  montelukast (SINGULAIR) 10 MG tablet TAKE ONE TABLET BY MOUTH EVERY NIGHT AT BEDTIME Patient taking differently: Take 10 mg by mouth at bedtime. 03/24/20  Yes Burns, Claudina Lick, MD  predniSONE (DELTASONE) 5 MG tablet Take 5 mg by mouth daily. 03/12/20  Yes [provider]  SYMBICORT 160-4.5 MCG/ACT inhaler INHALE 1 TO 2 PUFFS BY MOUTH EVERY 12 HOURS THEN GARGLE AND SPIT AFTER USE Patient taking differently: Inhale 2 puffs into the lungs 2 times daily at 12 noon and 4 pm. 01/21/20  Yes Burns, Claudina Lick, MD  theophylline (UNIPHYL) 400 MG 24 hr tablet TAKE 1/2 TABLET BY MOUTH TWO TIMES A DAY Patient taking differently: Take 200 mg by mouth in the morning and at bedtime. 01/14/20  Yes Burns, Claudina Lick, MD  traMADol (ULTRAM) 50 MG tablet TAKE ONE TABLET BY MOUTH EVERY 8 HOURS AS NEEDED FOR  MODERATE PAIN Patient taking differently: Take 50 mg by mouth 3 (three) times daily as needed for moderate pain. 05/21/20  Yes Burns, Claudina Lick, MD  allopurinol (ZYLOPRIM) 300 MG tablet Take 1 tablet (300 mg total) by mouth daily. Patient not taking: No sig reported 04/09/20   [name], MD  amoxicillin-clavulanate (AUGMENTIN) 875-125 MG tablet Take 1 tablet by mouth 2 (two) times daily. Patient not taking: No sig reported 05/30/20   [name], MD  Cholecalciferol (VITAMIN D3) 1000 units CAPS Take 1 capsule (1,000 Units total) by mouth daily with lunch. Patient not taking: No sig reported 03/30/18   Lauraine Rinne  J, PA-C  diclofenac Sodium (VOLTAREN) 1 % GEL Apply 2 g topically 2 (two) times daily as needed (back pain).    [provider]  diltiazem (CARDIZEM CD) 180 MG 24 hr capsule Take 1 capsule (180 mg total) by mouth daily. 10/29/19 01/27/20  Lewayne Bunting, MD  methocarbamol (ROBAXIN) 500 MG tablet TAKE ONE TABLET BY MOUTH EVERY 6 HOURS AS NEEDED FOR MUSCLE SPASMS Patient taking differently: Take 500 mg by mouth every 6 (six) hours as needed for muscle spasms. 03/03/20   Pincus Sanes, MD  ondansetron (ZOFRAN) 4 MG tablet Take 1 tablet (4 mg total) by mouth every 8 (eight) hours as needed for nausea or vomiting. 06/16/20   Pincus Sanes, MD    Inpatient Medications: Scheduled Meds:  apixaban  5 mg Oral BID   furosemide  40 mg Intravenous Daily   gabapentin  600 mg Oral QHS   levothyroxine  75 mcg Oral Q0600   LORazepam  1 mg Oral QHS   montelukast  10 mg Oral QHS   potassium chloride  40 mEq Oral Q4H   theophylline  200 mg Oral BID   Continuous Infusions:  diltiazem (CARDIZEM) infusion 10 mg/hr (06/19/20 1442)   lactated ringers     PRN Meds: acetaminophen, ondansetron (ZOFRAN) IV, traMADol  Allergies:    Allergies  Allergen Reactions   Simvastatin Other (See Comments)    myalgias    Social History:   Social History   Socioeconomic History    Marital status: Married    Spouse name: Not on file   Number of children: Not on file   Years of education: Not on file   Highest education level: Not on file  Occupational History   Occupation: retired  Tobacco Use   Smoking status: Former Smoker    Packs/day: 1.00    Years: 23.00    Pack years: 23.00    Quit date: 06/14/1989    Years since quitting: 31.0   Smokeless tobacco: Never Used   Tobacco comment: smoked 1961-1991 , up to < 1 ppd  Vaping Use   Vaping Use: Never used  Substance and Sexual Activity   Alcohol use: Yes    Alcohol/week: 14.0 standard drinks    Types: 14 Glasses of wine per week    Comment: Wine   Drug use: No   Sexual activity: Not on file  Other Topics Concern   Not on file  Social History Narrative   Not on file   Social Determinants of Health   Financial Resource Strain: Low Risk    Difficulty of Paying Living Expenses: Not hard at all  Food Insecurity: Not on file  Transportation Needs: Not on file  Physical Activity: Not on file  Stress: Not on file  Social Connections: Not on file  Intimate Partner Violence: Not on file    Family History:   Family History  Problem Relation Age of Onset   Heart attack Brother 82   Parkinsonism Mother    Stroke Mother    Heart attack Father        pre 51   Diabetes Sister    COPD Sister        X 3   Other Son        suicide 2016   Colon cancer Neg Hx    Cancer Neg Hx    Esophageal cancer Neg Hx    Liver cancer Neg Hx    Pancreatic cancer Neg Hx  Rectal cancer Neg Hx    Stomach cancer Neg Hx      ROS:  Please see the history of present illness.  All other ROS reviewed and negative.     Physical Exam/Data:   Vitals:   06/21/2020 2300 06/19/20 0300 06/19/20 0751 06/19/20 1050  BP: 103/63 (!) 96/57 (!) 121/51 100/84  Pulse: 100 (!) 129 96   Resp: 14 16 19 19   Temp: 97.9 F (36.6 C) 98.6 F (37 C)  (!) 97.5 F (36.4 C)  TempSrc: Oral Oral  Oral  SpO2: 92% 98% 93% 98%  Weight:       Height:        Intake/Output Summary (Last 24 hours) at 06/19/2020 1515 Last data filed at 06/19/2020 0500 Gross per 24 hour  Intake 397.94 ml  Output --  Net 397.94 ml   Last 3 Weights 07/07/2020 05/26/2020 02/06/2020  Weight (lbs) 119 lb 14.9 oz 120 lb 128 lb  Weight (kg) 54.4 kg 54.432 kg 58.06 kg     Body mass index is 21.24 kg/m.  General:  Well nourished, thin female, in no acute distress HEENT: normal Lymph: no adenopathy Neck: no JVD Endocrine:  No thryomegaly Vascular: No carotid bruits  Cardiac:  irreg-irreg; 1-2/6 SM, no gallops or rubs Lungs:  Diminished at the bases, no wheezing, rhonchi or rales  Abd: soft, nontender Ext: no edema Musculoskeletal:  No deformities, thin, frail, advanced atrophy Skin: warm and dry  Neuro:   Known to be legally blind, no gross focal motor abnormalities noted Psych:  Normal affect   EKG:  The EKG was personally reviewed and demonstrates:    #1 AFluutter, looks atypical, 97bpm, LAD, poor an R progression #2 AFlutter 129  OLD  05/26/20 AFlutter 129 07/24/19 SR , LAD 07/09/19 Afib 118bpm  Telemetry:  Telemetry was personally reviewed and demonstrates:   AFlutter 90's-120's  Relevant CV Studies:  06/19/20: TTE IMPRESSIONS  1. There is significant change since the study on 05/02/2018. LVEF has  decreased from 55-60% to 35-40% with diffuse hypokinesis. RV is newly  severely dilated with severe systolic dysfunction, severe tricuspid  regurgitation and moderate pulmonary  hypertension RVSP 48 mmHg. Large left pleural effusion.   2. Left ventricular ejection fraction, by estimation, is 35 to 40%. The  left ventricle has moderately decreased function. The left ventricle has  no regional wall motion abnormalities. There is mild concentric left  ventricular hypertrophy. Left  ventricular diastolic function could not be evaluated. There is the  interventricular septum is flattened in systole and diastole, consistent  with right  ventricular pressure and volume overload.   3. Right ventricular systolic function is severely reduced. The right  ventricular size is severely enlarged. There is moderately elevated  pulmonary artery systolic pressure. The estimated right ventricular  systolic pressure is 99991111 mmHg.   4. Right atrial size was moderately dilated.   5. Large pleural effusion in the left lateral region.   6. The mitral valve is normal in structure. Moderate mitral valve  regurgitation. No evidence of mitral stenosis.   7. Tricuspid valve regurgitation is severe.   8. The aortic valve is normal in structure. Aortic valve regurgitation is  not visualized. No aortic stenosis is present.   9. The inferior vena cava is dilated in size with <50% respiratory  variability, suggesting right atrial pressure of 15 mmHg.    05/02/2018: TTE Study Conclusions  - Left ventricle: The cavity size was normal. Systolic function was  normal. The estimated ejection fraction was in the range of 55%    to 60%. Wall motion was normal; there were no regional wall    motion abnormalities. Normal sinus rhythm was absent. The study    was not technically sufficient to allow evaluation of LV    diastolic dysfunction due to atrial fibrillation.  - Mitral valve: There was mild to moderate regurgitation.  - Left atrium: The atrium was moderately dilated.  - Tricuspid valve: There was mild-moderate regurgitation.  - Pulmonary arteries: PA peak pressure: 35 mm Hg (S).   Impressions:  - The right ventricular systolic pressure was increased consistent    with mild pulmonary hypertension.     Laboratory Data:  High Sensitivity Troponin:   Recent Labs  Lab 06/17/20 1607 06/21/2020 1115 07/04/2020 1401  TROPONINIHS 38* 42* 53*     Chemistry Recent Labs  Lab 06/17/20 1607 06/24/2020 1121 06/19/20 0046 06/19/20 1200  NA 131* 132* 125* 131*  K 3.3* 3.2* 2.8*  --   CL 94* 94* 92*  --   CO2 21* 22 17*  --   GLUCOSE 137* 85 70   --   BUN 12 15 16   --   CREATININE 1.26* 1.95* 1.99*  --   CALCIUM 8.5* 8.6* 7.6*  --   GFRNONAA 44* 26* 26*  --   ANIONGAP 16* 16* 16*  --     Recent Labs  Lab 06/30/2020 1121  PROT 5.8*  ALBUMIN 3.4*  AST 35  ALT 16  ALKPHOS 136*  BILITOT 1.4*   Hematology Recent Labs  Lab 06/17/20 1607 06/24/2020 1115 06/19/20 0046  WBC 15.6* 22.0* 20.5*  RBC 4.40 4.66 4.25  HGB 11.1* 11.2* 10.3*  HCT 37.2 40.3 35.8*  MCV 84.5 86.5 84.2  MCH 25.2* 24.0* 24.2*  MCHC 29.8* 27.8* 28.8*  RDW 21.5* 21.0* 20.7*  PLT 687* 544* 523*   BNP Recent Labs  Lab 07/12/2020 1124  BNP 1,162.3*    DDimer No results for input(s): DDIMER in the last 168 hours.   Radiology/Studies:   DG Chest 2 View Result Date: 06/17/2020 CLINICAL DATA:  Chest pain EXAM: CHEST - 2 VIEW COMPARISON:  05/26/2020 chest radiograph. FINDINGS: Stable cardiomediastinal silhouette with mild cardiomegaly. No pneumothorax. Small bilateral pleural effusions, left greater than right. Cephalization of the pulmonary vasculature without overt pulmonary edema. Dense left lung base consolidation. IMPRESSION: 1. Small bilateral pleural effusions, left greater than right. 2. Mild cardiomegaly without overt pulmonary edema. 3. Dense left lung base consolidation, which could represent atelectasis, aspiration or pneumonia. Chest radiograph follow-up advised. Electronically Signed   By: Ilona Sorrel M.D.   On: 06/17/2020 16:19   DG Chest Port 1 View Result Date: 07/04/2020 CLINICAL DATA:  Sepsis.  Chest pain. EXAM: PORTABLE CHEST 1 VIEW COMPARISON:  06/17/2020. FINDINGS: Mediastinum and hilar structures normal. Cardiomegaly. Interim significant clearing of bilateral interstitial prominence suggesting clearing CHF. Mild residual. Persistent left base atelectasis/infiltrate. Interim near complete clearing of right pleural effusion. Persistent moderate left-sided pleural effusion. No pneumothorax. IMPRESSION: 1. Cardiomegaly. Interim significant  clearing of bilateral interstitial prominence suggesting clearing CHF. Mild residual. 2. Persistent left base atelectasis/infiltrate and moderate left pleural effusion. Electronically Signed   By: Marcello Moores  Register   On: 06/23/2020 13:44     Assessment and Plan:   1. Paroxysmal AFib, atypical Aflutter     CHA2DS2Vasc is 8, resumed on a/c this admission, heparin gtt yesterday > Eliquis today     Planned for TEE/DCCV tomorrow  New  CM suspect tachy-mediated (BiVe failure) RV is severely dilated with severe systolic dysfunction and severe TR, RVSP is 48 Acute CHF Marked hypokalemia hypomagnesemia is better Leukocytosis up from her baseline AKI far from her baseline  Her only good AAD option is Tikosyn reveiw of her last SR EKG QT looked acceptable (Feb 2021)  Her Calc CrCl with today's Creat is 21  BARELY qualifies for dosing Her baseline Creat is about 0.8 >> Creat Cl would be more in the 50's  (with today's weight, Creat would need to improve to 1.0 for a dose)  Would recommend start Tikosyn once her electrolytes and renal function allow. May be worth waiting to start until her renal function allows the higher dose, if it improves once IV diuresis finished Lytes need to be: K+ 4.0 or better and mag 2.0 or better I will consult pharmacy to review her meds, though don't see any contraindicated meds  Continue ongoing management with primary team EP will follow along though suspect she won't be ready for Tikosyn for a couple days   { For questions or updates, please contact [facility] Please consult [email or web address] for contact info under    Signed, Sheilah Pigeon, PA-C  06/19/2020 3:15 PM  EP Attending  Patient seen and examined. Agree with the findings as noted above. The patient is a pleasant elderly woman with symptomatic atrial fib/flutter who was admitted with uncontrolled atrial fib. She has developed worsening CHF symptoms. She has been diuresed and  developed worsening renal insufficiency as well. Her rates in atrial fib/flutter are well controlled. I would recommend she undergo TEE guided DCCV. If her QT in NSR is acceptable and renal function improves, then I would suggest initiation of dofetilide base on renal function.  Sharlot Gowda Hans Rusher,MD

## 2020-06-19 NOTE — Plan of Care (Signed)
  Problem: Education: Goal: Knowledge of General Education information will improve Description: Including pain rating scale, medication(s)/side effects and non-pharmacologic comfort measures Outcome: Progressing   Problem: Health Behavior/Discharge Planning: Goal: Ability to manage health-related needs will improve Outcome: Progressing   Problem: Clinical Measurements: Goal: Will remain free from infection Outcome: Progressing Goal: Diagnostic test results will improve Outcome: Progressing   Problem: Nutrition: Goal: Adequate nutrition will be maintained Outcome: Progressing   Problem: Coping: Goal: Level of anxiety will decrease Outcome: Progressing   Problem: Pain Managment: Goal: General experience of comfort will improve Outcome: Progressing

## 2020-06-20 ENCOUNTER — Encounter (HOSPITAL_COMMUNITY): Admission: EM | Disposition: E | Payer: Self-pay | Source: Home / Self Care | Attending: Family Medicine

## 2020-06-20 ENCOUNTER — Inpatient Hospital Stay (HOSPITAL_COMMUNITY): Payer: Medicare Other | Admitting: Anesthesiology

## 2020-06-20 ENCOUNTER — Inpatient Hospital Stay (HOSPITAL_COMMUNITY): Payer: Medicare Other

## 2020-06-20 ENCOUNTER — Telehealth: Payer: Medicare Other | Admitting: Internal Medicine

## 2020-06-20 ENCOUNTER — Encounter (HOSPITAL_COMMUNITY): Payer: Self-pay | Admitting: Family Medicine

## 2020-06-20 DIAGNOSIS — J84112 Idiopathic pulmonary fibrosis: Secondary | ICD-10-CM

## 2020-06-20 DIAGNOSIS — I361 Nonrheumatic tricuspid (valve) insufficiency: Secondary | ICD-10-CM

## 2020-06-20 DIAGNOSIS — N179 Acute kidney failure, unspecified: Secondary | ICD-10-CM | POA: Diagnosis not present

## 2020-06-20 DIAGNOSIS — I5082 Biventricular heart failure: Secondary | ICD-10-CM | POA: Diagnosis not present

## 2020-06-20 DIAGNOSIS — I340 Nonrheumatic mitral (valve) insufficiency: Secondary | ICD-10-CM | POA: Diagnosis not present

## 2020-06-20 DIAGNOSIS — I313 Pericardial effusion (noninflammatory): Secondary | ICD-10-CM

## 2020-06-20 DIAGNOSIS — I4892 Unspecified atrial flutter: Secondary | ICD-10-CM

## 2020-06-20 DIAGNOSIS — J449 Chronic obstructive pulmonary disease, unspecified: Secondary | ICD-10-CM

## 2020-06-20 DIAGNOSIS — I4891 Unspecified atrial fibrillation: Secondary | ICD-10-CM | POA: Diagnosis not present

## 2020-06-20 DIAGNOSIS — I5021 Acute systolic (congestive) heart failure: Secondary | ICD-10-CM | POA: Diagnosis not present

## 2020-06-20 HISTORY — PX: CARDIOVERSION: SHX1299

## 2020-06-20 HISTORY — PX: TEE WITHOUT CARDIOVERSION: SHX5443

## 2020-06-20 HISTORY — PX: BUBBLE STUDY: SHX6837

## 2020-06-20 LAB — BASIC METABOLIC PANEL
Anion gap: 10 (ref 5–15)
BUN: 16 mg/dL (ref 8–23)
CO2: 21 mmol/L — ABNORMAL LOW (ref 22–32)
Calcium: 8.3 mg/dL — ABNORMAL LOW (ref 8.9–10.3)
Chloride: 98 mmol/L (ref 98–111)
Creatinine, Ser: 1.48 mg/dL — ABNORMAL HIGH (ref 0.44–1.00)
GFR, Estimated: 36 mL/min — ABNORMAL LOW (ref >60)
Glucose, Bld: 109 mg/dL — ABNORMAL HIGH (ref 70–99)
Potassium: 4.6 mmol/L (ref 3.5–5.1)
Sodium: 129 mmol/L — ABNORMAL LOW (ref 135–145)

## 2020-06-20 LAB — CBC
HCT: 34.6 % — ABNORMAL LOW (ref 36.0–46.0)
Hemoglobin: 10 g/dL — ABNORMAL LOW (ref 12.0–15.0)
MCH: 24 pg — ABNORMAL LOW (ref 26.0–34.0)
MCHC: 28.9 g/dL — ABNORMAL LOW (ref 30.0–36.0)
MCV: 83.2 fL (ref 80.0–100.0)
Platelets: 544 10*3/uL — ABNORMAL HIGH (ref 150–400)
RBC: 4.16 MIL/uL (ref 3.87–5.11)
RDW: 20.5 % — ABNORMAL HIGH (ref 11.5–15.5)
WBC: 20.7 10*3/uL — ABNORMAL HIGH (ref 4.0–10.5)
nRBC: 0 % (ref 0.0–0.2)

## 2020-06-20 LAB — URINALYSIS, ROUTINE W REFLEX MICROSCOPIC
Bilirubin Urine: NEGATIVE
Glucose, UA: NEGATIVE mg/dL
Ketones, ur: NEGATIVE mg/dL
Nitrite: NEGATIVE
Protein, ur: NEGATIVE mg/dL
Specific Gravity, Urine: 1.009 (ref 1.005–1.030)
pH: 5 (ref 5.0–8.0)

## 2020-06-20 LAB — PROTIME-INR
INR: 1.2 (ref 0.8–1.2)
Prothrombin Time: 14.7 seconds (ref 11.4–15.2)

## 2020-06-20 LAB — MAGNESIUM: Magnesium: 1.8 mg/dL (ref 1.7–2.4)

## 2020-06-20 SURGERY — ECHOCARDIOGRAM, TRANSESOPHAGEAL
Anesthesia: General

## 2020-06-20 MED ORDER — DOFETILIDE 250 MCG PO CAPS
250.0000 ug | ORAL_CAPSULE | Freq: Two times a day (BID) | ORAL | Status: DC
  Administered 2020-06-20 – 2020-06-25 (×10): 250 ug via ORAL
  Filled 2020-06-20 (×12): qty 1

## 2020-06-20 MED ORDER — PHENYLEPHRINE HCL (PRESSORS) 10 MG/ML IV SOLN
INTRAVENOUS | Status: DC | PRN
  Administered 2020-06-20: 40 ug via INTRAVENOUS
  Administered 2020-06-20 (×3): 120 ug via INTRAVENOUS

## 2020-06-20 MED ORDER — METOPROLOL TARTRATE 12.5 MG HALF TABLET
12.5000 mg | ORAL_TABLET | Freq: Two times a day (BID) | ORAL | Status: DC
  Administered 2020-06-20 – 2020-06-22 (×5): 12.5 mg via ORAL
  Filled 2020-06-20 (×5): qty 1

## 2020-06-20 MED ORDER — IPRATROPIUM-ALBUTEROL 0.5-2.5 (3) MG/3ML IN SOLN
RESPIRATORY_TRACT | Status: AC
  Filled 2020-06-20: qty 3

## 2020-06-20 MED ORDER — BUTAMBEN-TETRACAINE-BENZOCAINE 2-2-14 % EX AERO
INHALATION_SPRAY | CUTANEOUS | Status: DC | PRN
  Administered 2020-06-20: 2 via TOPICAL

## 2020-06-20 MED ORDER — ALBUMIN HUMAN 5 % IV SOLN
INTRAVENOUS | Status: DC | PRN
Start: 2020-06-20 — End: 2020-06-20

## 2020-06-20 MED ORDER — SODIUM CHLORIDE 0.9% FLUSH: 3.0000 mL | INTRAVENOUS | Status: DC | PRN

## 2020-06-20 MED ORDER — SODIUM CHLORIDE 0.9 % IV SOLN: 250.0000 mL | INTRAVENOUS | Status: DC | PRN

## 2020-06-20 MED ORDER — IPRATROPIUM-ALBUTEROL 0.5-2.5 (3) MG/3ML IN SOLN
3.0000 mL | Freq: Once | RESPIRATORY_TRACT | Status: AC
  Administered 2020-06-20: 3 mL via RESPIRATORY_TRACT

## 2020-06-20 MED ORDER — SODIUM CHLORIDE 0.9% FLUSH
3.0000 mL | Freq: Two times a day (BID) | INTRAVENOUS | Status: DC
  Administered 2020-06-20 – 2020-06-25 (×9): 3 mL via INTRAVENOUS

## 2020-06-20 MED ORDER — MAGNESIUM SULFATE 2 GM/50ML IV SOLN
2.0000 g | Freq: Once | INTRAVENOUS | Status: AC
  Administered 2020-06-20: 2 g via INTRAVENOUS
  Filled 2020-06-20: qty 50

## 2020-06-20 MED ORDER — PHENYLEPHRINE HCL-NACL 10-0.9 MG/250ML-% IV SOLN
INTRAVENOUS | Status: DC | PRN
  Administered 2020-06-20: 50 ug/min via INTRAVENOUS

## 2020-06-20 MED ORDER — PROPOFOL 10 MG/ML IV BOLUS
INTRAVENOUS | Status: DC | PRN
  Administered 2020-06-20: 20 mg via INTRAVENOUS
  Administered 2020-06-20: 30 mg via INTRAVENOUS

## 2020-06-20 MED ORDER — LACTATED RINGERS IV SOLN: INTRAVENOUS | Status: DC | PRN

## 2020-06-20 MED ORDER — LIDOCAINE HCL (CARDIAC) PF 100 MG/5ML IV SOSY
PREFILLED_SYRINGE | INTRAVENOUS | Status: DC | PRN
  Administered 2020-06-20: 60 mg via INTRATRACHEAL

## 2020-06-20 MED ORDER — PROPOFOL 500 MG/50ML IV EMUL
INTRAVENOUS | Status: DC | PRN
  Administered 2020-06-20: 100 ug/kg/min via INTRAVENOUS

## 2020-06-20 NOTE — CV Procedure (Signed)
   TRANSESOPHAGEAL ECHOCARDIOGRAM GUIDED DIRECT CURRENT CARDIOVERSION  NAME:  Crystal [name]    MRN: [id number] DOB:  January 29, 1946    ADMIT DATE: 07-01-20  INDICATIONS: Symptomatic atrial flutter  PROCEDURE:   Informed consent was obtained prior to the procedure. The risks, benefits and alternatives for the procedure were discussed and the patient comprehended these risks.  Risks include, but are not limited to, cough, sore throat, vomiting, nausea, somnolence, esophageal and stomach trauma or perforation, bleeding, low blood pressure, aspiration, pneumonia, infection, trauma to the teeth and death.    After a procedural time-out, the oropharynx was anesthetized and the patient was sedated by the anesthesia service. The transesophageal probe was inserted in the esophagus and stomach without difficulty and multiple views were obtained. Anesthesia was monitored by Dr. Osa Craver.   COMPLICATIONS:    Complications: No complications Patient tolerated procedure well.  KEY FINDINGS:  1. No LAA thrombus.  2. LVEF ~50-55%. 3. Severely dilated RV/reduced RV function 4. Moderate to severe TR. 5. PFO with R to L shunting. 6. Full Report to follow.   CARDIOVERSION:     Indications:  Symptomatic Atrial Flutter  Procedure Details:  Once the TEE was complete, the patient had the defibrillator pads placed in the anterior and posterior position. Once an appropriate level of sedation was confirmed, the patient was cardioverted x 1 with 120J of biphasic synchronized energy.  The patient converted to NSR.  There were no apparent complications.  The patient had normal neuro status and respiratory status post procedure with vitals stable as recorded elsewhere.  Adequate airway was maintained throughout and vital signs monitored per protocol.  [place] Bells T. Audie Box, Newborn  [address] [place], [place] [address] [phone]  11:58 AM

## 2020-06-20 NOTE — Anesthesia Preprocedure Evaluation (Addendum)
Anesthesia Evaluation  Patient identified by MRN, date of birth, ID band Patient awake    Reviewed: Allergy & Precautions, NPO status , Patient's Chart, lab work & pertinent test results  Airway Mallampati: II  TM Distance: >3 FB Neck ROM: Full    Dental no notable dental hx. (+) Teeth Intact, Dental Advisory Given   Pulmonary asthma , COPD,  COPD inhaler, former smoker,  Quit smoking 1991, 23 pack year history  Pulmonary fibrosis    Pulmonary exam normal breath sounds clear to auscultation       Cardiovascular hypertension, + Peripheral Vascular Disease and +CHF (LVEF 35-40%, severe RVF)  Normal cardiovascular exam+ dysrhythmias Atrial Fibrillation + Valvular Problems/Murmurs (mod MR) MR  Rhythm:Regular Rate:Normal  Echo 06/2020: 1. There is significant change since the study on 05/02/2018. LVEF has decreased from 55-60% to 35-40% with diffuse hypokinesis. RV is newly  severely dilated with severe systolic dysfunction, severe tricuspid regurgitation and moderate pulmonary  hypertension RVSP 48 mmHg. Large left pleural effusion.  2. Left ventricular ejection fraction, by estimation, is 35 to 40%. The left ventricle has moderately decreased function. The left ventricle has  no regional wall motion abnormalities. There is mild concentric left  ventricular hypertrophy. Left  ventricular diastolic function could not be evaluated. There is the  interventricular septum is flattened in systole and diastole, consistent  with right ventricular pressure and volume overload.  3. Right ventricular systolic function is severely reduced. The right  ventricular size is severely enlarged. There is moderately elevated  pulmonary artery systolic pressure. The estimated right ventricular  systolic pressure is 37.1 mmHg.  4. Right atrial size was moderately dilated.  5. Large pleural effusion in the left lateral region.  6. The mitral valve is  normal in structure. Moderate mitral valve regurgitation. No evidence of mitral stenosis.  7. Tricuspid valve regurgitation is severe.  8. The aortic valve is normal in structure. Aortic valve regurgitation is not visualized. No aortic stenosis is present.  9. The inferior vena cava is dilated in size with <50% respiratory variability, suggesting right atrial pressure of 15 mmHg.    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   came to the ER 06/17/20 with progressive weakness SOB, and palpitations the day or so prior to admission- found to be in Afib with RVR and fluid overloaded, CHFrEF  Hx AF- stopped the eliquis because she felt she was bruising too much  Reproductive/Obstetrics negative OB ROS                           Anesthesia Physical Anesthesia Plan  ASA: IV  Anesthesia Plan: General   Post-op Pain Management:    Induction: Intravenous  PONV Risk Score and Plan: TIVA and Treatment may vary due to age or medical condition  Airway Management Planned: Natural Airway and Mask  Additional Equipment: None  Intra-op Plan:   Post-operative Plan:   Informed Consent: I have reviewed the patients History and Physical, chart, labs and discussed the procedure including the risks, benefits  and alternatives for the proposed anesthesia with the patient or authorized representative who has indicated his/her understanding and acceptance.   Patient has DNR.  Discussed DNR with patient and Suspend DNR.     Plan Discussed with:  CRNA  Anesthesia Plan Comments: (Suspend DNR perioperatively x 24h)       Anesthesia Quick Evaluation

## 2020-06-20 NOTE — Progress Notes (Signed)
  Echocardiogram Echocardiogram Transesophageal has been performed.  Crystal [name] 2020/07/13, 1:38 PM

## 2020-06-20 NOTE — Transfer of Care (Signed)
Immediate Anesthesia Transfer of Care Note  Patient: Crystal [name]  Procedure(s) Performed: TRANSESOPHAGEAL ECHOCARDIOGRAM (TEE) (N/A ) CARDIOVERSION (N/A )  Patient Location: PACU and Endoscopy Unit  Anesthesia Type:MAC  Level of Consciousness: awake, alert  and oriented  Airway & Oxygen Therapy: Patient Spontanous Breathing and Patient connected to face mask oxygen  Post-op Assessment: Report given to RN and Post -op Vital signs reviewed and stable  Post vital signs: Reviewed and stable  Last Vitals:  Vitals Value Taken Time  BP 104/58   Temp    Pulse 81   Resp 20   SpO2 90     Last Pain:  Vitals:   06/14/2020 1045  TempSrc: Oral  PainSc: 0-No pain      Patients Stated Pain Goal: 0 (70/78/67 5449)  Complications: No complications documented.

## 2020-06-20 NOTE — Progress Notes (Signed)
Heart Failure Stewardship Pharmacist Progress Note   PCP: [name], MD PCP-Cardiologist: [name], MD    HPI:  75 yo F with PMH of afib (self-discontinued PTA Eliquis), HLD, HTN, and PVD. She presented to the ED with weakness and palpitations. She was found to be in aflutter with RVR. An ECHO was done on 06/19/20 and LVEF is reduced to 35-40% (was 55-60% in 04/2018). Currently in ENDO for TEE/DCCV for aflutter.   Current HF Medications: Furosemide 40 mg IV daily Metoprolol tartrate 12.5 mg BID  Prior to admission HF Medications: Furosemide 40 mg daily  Pertinent Lab Values:  Serum creatinine 1.48, BUN 16, Potassium 4.6, Sodium 129, BNP 1162, Magnesium 1.9   Vital Signs:  Weight: 145 lbs (admission weight: 145 lbs)  Blood pressure: 110-120/70s   Heart rate: 110s  Medication Assistance / Insurance Benefits Check: Does the patient have prescription insurance?  Yes Type of insurance plan: BCBS Medicare  Does the patient qualify for medication assistance through manufacturers or grants?   Pending household income information  Eligible grants and/or patient assistance programs: pending  Medication assistance applications in progress: none   Medication assistance applications approved: none Approved medication assistance renewals will be completed by: [place]:  Prior to admission outpatient [facility] Is the patient willing to use [place] pharmacy at discharge? Yes Is the patient willing to transition their outpatient pharmacy to utilize a [facility] outpatient pharmacy?   Pending    Assessment: 1. Acute systolic CHF (EF 53-97%), likely due to aflutter RVR. NYHA class II symptoms. Pending TEE/DCCV. - Continue furosemide 40 mg IV daily - Continue metoprolol tartrate 12.5 mg BID - consider changing to XL at discharge once optimal dose established - Hold off adding ACE/ARB/Entresto, spironolactone, or SGLT2i while SCr elevated.  Baseline ~0.7.   Plan: 1) Medication changes recommended at this time: - No changes at this time - continue current regimen - Pending improvement in renal function, can consider optimizing HF therapy  2) Patient assistance application(s): - None pending  3)  Education  - To be completed prior to discharge  Kerby Nora, PharmD, BCPS Heart Failure Stewardship Pharmacist Phone [phone]

## 2020-06-20 NOTE — H&P (View-Only) (Signed)
Progress Note  Patient Name: Crystal [name] Date of Encounter: 07/02/2020  [facility] Cardiologist: [name], MD   Subjective   Feels better. No CV complaints at rest. Remains tachycardiac 120s despite diltiazem infusion. In/out not recorded accurately. Weight today 146 lb (judging by office visits baseline weight is probably 125-135). K normalized. Creatinine improving rapidly. Remains hyponatremic.  Inpatient Medications    Scheduled Meds: . apixaban  5 mg Oral BID  . furosemide  40 mg Intravenous Daily  . gabapentin  600 mg Oral QHS  . levothyroxine  75 mcg Oral Q0600  . LORazepam  1 mg Oral QHS  . montelukast  10 mg Oral QHS  . theophylline  200 mg Oral BID   Continuous Infusions: . sodium chloride 20 mL/hr at 07/12/2020 0325  . diltiazem (CARDIZEM) infusion 7.5 mg/hr (07/06/2020 0514)  . lactated ringers     PRN Meds: acetaminophen, ondansetron (ZOFRAN) IV, traMADol   Vital Signs    Vitals:   06/16/2020 0400 06/24/2020 0500 07/09/2020 0600 06/19/2020 0830  BP: 112/64 109/63 113/72   Pulse: (!) 123 (!) 142 (!) 126 (!) 111  Resp: 18 18 16    Temp:    99.1 F (37.3 C)  TempSrc:    Oral  SpO2:      Weight:   66.2 kg   Height:        Intake/Output Summary (Last 24 hours) at 07/06/2020 0922 Last data filed at 07/11/2020 0325 Gross per 24 hour  Intake 459.24 ml  Output 60 ml  Net 399.24 ml   Last 3 Weights 07/12/2020 06/21/2020 05/26/2020  Weight (lbs) 145 lb 15.1 oz 119 lb 14.9 oz 120 lb  Weight (kg) 66.2 kg 54.4 kg 54.432 kg      Telemetry    AFlutter w RVR, mostly 2:1 AV block, rates 120s- Personally Reviewed  ECG    A flutter w 2: 1 AV block - Personally Reviewed  Physical Exam  Appears comfortable GEN: No acute distress.   Neck: 7-8 cm JVD w prominent v waves Cardiac: tachycardia, irregular, 2/6 holosystolic LLSB murmur, no diastolic murmurs, rubs, or gallops.  Respiratory: Clear to auscultation bilaterally. GI: Soft, nontender, non-distended  MS:  1+ ankle edema; No deformity. Neuro:  Nonfocal  Psych: Normal affect   Labs    High Sensitivity Troponin:   Recent Labs  Lab 06/17/20 1607 07/08/2020 1115 06/22/2020 1401  TROPONINIHS 38* 42* 53*      Chemistry Recent Labs  Lab 07/12/2020 1121 06/19/20 0046 06/19/20 1200 06/19/20 1848 06/19/2020 0102  NA 132* 125* 131* 130* 129*  K 3.2* 2.8*  --   --  4.6  CL 94* 92*  --   --  98  CO2 22 17*  --   --  21*  GLUCOSE 85 70  --   --  109*  BUN 15 16  --   --  16  CREATININE 1.95* 1.99*  --   --  1.48*  CALCIUM 8.6* 7.6*  --   --  8.3*  PROT 5.8*  --   --   --   --   ALBUMIN 3.4*  --   --   --   --   AST 35  --   --   --   --   ALT 16  --   --   --   --   ALKPHOS 136*  --   --   --   --   BILITOT 1.4*  --   --   --   --  GFRNONAA 26* 26*  --   --  36*  ANIONGAP 16* 16*  --   --  10     Hematology Recent Labs  Lab 07/13/2020 1115 06/19/20 0046 07/01/2020 0102  WBC 22.0* 20.5* 20.7*  RBC 4.66 4.25 4.16  HGB 11.2* 10.3* 10.0*  HCT 40.3 35.8* 34.6*  MCV 86.5 84.2 83.2  MCH 24.0* 24.2* 24.0*  MCHC 27.8* 28.8* 28.9*  RDW 21.0* 20.7* 20.5*  PLT 544* 523* 544*    BNP Recent Labs  Lab 06/14/2020 1124  BNP 1,162.3*     DDimer No results for input(s): DDIMER in the last 168 hours.   Radiology    DG Chest Port 1 View  Result Date: 07/03/2020 CLINICAL DATA:  Sepsis.  Chest pain. EXAM: PORTABLE CHEST 1 VIEW COMPARISON:  06/17/2020. FINDINGS: Mediastinum and hilar structures normal. Cardiomegaly. Interim significant clearing of bilateral interstitial prominence suggesting clearing CHF. Mild residual. Persistent left base atelectasis/infiltrate. Interim near complete clearing of right pleural effusion. Persistent moderate left-sided pleural effusion. No pneumothorax. IMPRESSION: 1. Cardiomegaly. Interim significant clearing of bilateral interstitial prominence suggesting clearing CHF. Mild residual. 2. Persistent left base atelectasis/infiltrate and moderate left pleural effusion.  Electronically Signed   By: Marcello Moores  Register   On: 07/07/2020 13:44   ECHOCARDIOGRAM COMPLETE  Result Date: 06/19/2020    ECHOCARDIOGRAM REPORT   Patient Name:   Crystal [name] Date of Exam: 06/19/2020 Medical Rec #:  [id number]    Height:       63.0 in Accession #:    [id number]   Weight:       119.9 lb Date of Birth:  08-31-1945     BSA:          1.556 m Patient Age:    75 years     BP:           121/51 mmHg Patient Gender: F            HR:           84 bpm. Exam Location:  Inpatient Procedure: 2D Echo, 3D Echo, Cardiac Doppler and Color Doppler Indications:    R94.31 Abnormal EKG  History:        Patient has prior history of Echocardiogram examinations, most                 recent 05/02/2018. Abnormal ECG, Arrythmias:Atrial Fibrillation;                 Risk Factors:Dyslipidemia and Hypertension. Elevated troponin.  Sonographer:    Roseanna Rainbow RDCS Referring Phys: [id number] [place]  1. There is significant change since the study on 05/02/2018. LVEF has decreased from 55-60% to 35-40% with diffuse hypokinesis. RV is newly severely dilated with severe systolic dysfunction, severe tricuspid regurgitation and moderate pulmonary hypertension RVSP 48 mmHg. Large left pleural effusion.  2. Left ventricular ejection fraction, by estimation, is 35 to 40%. The left ventricle has moderately decreased function. The left ventricle has no regional wall motion abnormalities. There is mild concentric left ventricular hypertrophy. Left ventricular diastolic function could not be evaluated. There is the interventricular septum is flattened in systole and diastole, consistent with right ventricular pressure and volume overload.  3. Right ventricular systolic function is severely reduced. The right ventricular size is severely enlarged. There is moderately elevated pulmonary artery systolic pressure. The estimated right ventricular systolic pressure is [id number] mmHg.  4. Right atrial size was moderately dilated.  5. Large  pleural effusion in the left  lateral region.  6. The mitral valve is normal in structure. Moderate mitral valve regurgitation. No evidence of mitral stenosis.  7. Tricuspid valve regurgitation is severe.  8. The aortic valve is normal in structure. Aortic valve regurgitation is not visualized. No aortic stenosis is present.  9. The inferior vena cava is dilated in size with <50% respiratory variability, suggesting right atrial pressure of 15 mmHg. FINDINGS  Left Ventricle: Left ventricular ejection fraction, by estimation, is 35 to 40%. The left ventricle has moderately decreased function. The left ventricle has no regional wall motion abnormalities. The left ventricular internal cavity size was normal in size. There is mild concentric left ventricular hypertrophy. The interventricular septum is flattened in systole and diastole, consistent with right ventricular pressure and volume overload. Left ventricular diastolic function could not be evaluated due to atrial fibrillation. Left ventricular diastolic function could not be evaluated. Right Ventricle: The right ventricular size is severely enlarged. No increase in right ventricular wall thickness. Right ventricular systolic function is severely reduced. There is moderately elevated pulmonary artery systolic pressure. The tricuspid regurgitant velocity is 2.85 m/s, and with an assumed right atrial pressure of 15 mmHg, the estimated right ventricular systolic pressure is 76.1 mmHg. Left Atrium: Left atrial size was normal in size. Right Atrium: Right atrial size was moderately dilated. Pericardium: There is no evidence of pericardial effusion. Mitral Valve: The mitral valve is normal in structure. Moderate mitral valve regurgitation. No evidence of mitral valve stenosis. Tricuspid Valve: The tricuspid valve is normal in structure. Tricuspid valve regurgitation is severe. No evidence of tricuspid stenosis. Aortic Valve: The aortic valve is normal in structure. Aortic  valve regurgitation is not visualized. No aortic stenosis is present. Pulmonic Valve: The pulmonic valve was normal in structure. Pulmonic valve regurgitation is not visualized. No evidence of pulmonic stenosis. Aorta: The aortic root is normal in size and structure. Venous: The inferior vena cava is dilated in size with less than 50% respiratory variability, suggesting right atrial pressure of 15 mmHg. IAS/Shunts: No atrial level shunt detected by color flow Doppler. Additional Comments: There is a large pleural effusion in the left lateral region.  LEFT VENTRICLE PLAX 2D LVIDd:         3.60 cm LVIDs:         3.10 cm LV PW:         1.50 cm LV IVS:        1.10 cm LVOT diam:     1.70 cm LV SV:         39 LV SV Index:   25 LVOT Area:     2.27 cm  LV Volumes (MOD) LV vol d, MOD A2C: 40.1 ml LV vol d, MOD A4C: 37.4 ml LV vol s, MOD A2C: 25.7 ml LV vol s, MOD A4C: 23.0 ml LV SV MOD A2C:     14.4 ml LV SV MOD A4C:     37.4 ml LV SV MOD BP:      18.8 ml RIGHT VENTRICLE         IVC TAPSE (M-mode): 0.8 cm  IVC diam: 2.40 cm LEFT ATRIUM           Index       RIGHT ATRIUM           Index LA diam:      3.50 cm 2.25 cm/m  RA Area:     18.90 cm LA Vol (A2C): 20.3 ml 13.05 ml/m RA Volume:   58.30 ml  37.47 ml/m LA Vol (A4C): 35.0 ml 22.49 ml/m  AORTIC VALVE LVOT Vmax:   116.00 cm/s LVOT Vmean:  75.800 cm/s LVOT VTI:    0.172 m  AORTA Ao Root diam: 2.50 cm Ao Asc diam:  2.90 cm MITRAL VALVE               TRICUSPID VALVE MV Area (PHT): 5.27 cm    TR Peak grad:   32.5 mmHg MV Decel Time: 144 msec    TR Vmax:        285.00 cm/s MV E velocity: 55.27 cm/s                            SHUNTS                            Systemic VTI:  0.17 m                            Systemic Diam: 1.70 cm Ena Dawley MD Electronically signed by Ena Dawley MD Signature Date/Time: 06/19/2020/12:21:34 PM    Final     Cardiac Studies   Echo: 06/19/20  1. There is significant change since the study on 05/02/2018. LVEF has  decreased from  55-60% to 35-40% with diffuse hypokinesis. RV is newly  severely dilated with severe systolic dysfunction, severe tricuspid  regurgitation and moderate pulmonary  hypertension RVSP 48 mmHg. Large left pleural effusion.  2. Left ventricular ejection fraction, by estimation, is 35 to 40%. The  left ventricle has moderately decreased function. The left ventricle has  no regional wall motion abnormalities. There is mild concentric left  ventricular hypertrophy. Left  ventricular diastolic function could not be evaluated. There is the  interventricular septum is flattened in systole and diastole, consistent  with right ventricular pressure and volume overload.  3. Right ventricular systolic function is severely reduced. The right  ventricular size is severely enlarged. There is moderately elevated  pulmonary artery systolic pressure. The estimated right ventricular  systolic pressure is 67.2 mmHg. m 4. Right atrial size was moderately dilated.  5. Large pleural effusion in the left lateral region.  6. The mitral valve is normal in structure. Moderate mitral valve  regurgitation. No evidence of mitral stenosis.  7. Tricuspid valve regurgitation is severe.  8. The aortic valve is normal in structure. Aortic valve regurgitation is  not visualized. No aortic stenosis is present.  9. The inferior vena cava is dilated in size with <50% respiratory  variability, suggesting right atrial pressure of 15 mmHg.   Patient Profile     75 y.o. 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   Assessment & Plan    1. Atrial Flutter with RVR: Previous AFib. For TEE-DCCV today. On Eliquis, 3rd dose today. 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 Difficult rate control despite IV diltiazem. Add very low dose metoprolol. Plan  dofetilide once renal function back to baseline. CHA2DS2-VASc Score of at least 5.  2. Acute systolic HF: Echo this admission with decline in EF from 55-60% to 35-40% with diffuse hypokinesis along with new severe RV dysfunction, severe TR and moderate pulmonary HTN with RVSP 43mmHg. 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  3. Leukocytosis: Persistent,  but etiology unclear. CXR with possible left base infiltrate but she has been afebrile. Lactic trending down. Currently not treated with antibiotics. BC negative to date  4. Hypokalemia: Resolved. Critical to maintain normal K levels after starting dofetilide. Reports having several episodes of diarrhea likely 2/2 IBS flare last week. Also on daily lasix/K+ supplement at home.   5. AKI: improving rapidly.  6. 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 Theophylline may predispose to recurrent arrhythmia. She may tolerate a low dose of selective beta-1 blocker.  7. 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

## 2020-06-20 NOTE — Progress Notes (Signed)
Progress Note  Patient Name: Crystal [name] Date of Encounter: 07/02/2020  [facility] Cardiologist: [name], MD   Subjective   Feels better. No CV complaints at rest. Remains tachycardiac 120s despite diltiazem infusion. In/out not recorded accurately. Weight today 146 lb (judging by office visits baseline weight is probably 125-135). K normalized. Creatinine improving rapidly. Remains hyponatremic.  Inpatient Medications    Scheduled Meds: . apixaban  5 mg Oral BID  . furosemide  40 mg Intravenous Daily  . gabapentin  600 mg Oral QHS  . levothyroxine  75 mcg Oral Q0600  . LORazepam  1 mg Oral QHS  . montelukast  10 mg Oral QHS  . theophylline  200 mg Oral BID   Continuous Infusions: . sodium chloride 20 mL/hr at 07/13/2020 0325  . diltiazem (CARDIZEM) infusion 7.5 mg/hr (07/12/2020 0514)  . lactated ringers     PRN Meds: acetaminophen, ondansetron (ZOFRAN) IV, traMADol   Vital Signs    Vitals:   07/08/2020 0400 07/01/2020 0500 06/16/2020 0600 07/05/2020 0830  BP: 112/64 109/63 113/72   Pulse: (!) 123 (!) 142 (!) 126 (!) 111  Resp: 18 18 16    Temp:    99.1 F (37.3 C)  TempSrc:    Oral  SpO2:      Weight:   66.2 kg   Height:        Intake/Output Summary (Last 24 hours) at 06/30/2020 0922 Last data filed at 07/01/2020 0325 Gross per 24 hour  Intake 459.24 ml  Output 60 ml  Net 399.24 ml   Last 3 Weights 06/30/2020 07/07/2020 05/26/2020  Weight (lbs) 145 lb 15.1 oz 119 lb 14.9 oz 120 lb  Weight (kg) 66.2 kg 54.4 kg 54.432 kg      Telemetry    AFlutter w RVR, mostly 2:1 AV block, rates 120s- Personally Reviewed  ECG    A flutter w 2: 1 AV block - Personally Reviewed  Physical Exam  Appears comfortable GEN: No acute distress.   Neck: 7-8 cm JVD w prominent v waves Cardiac: tachycardia, irregular, 2/6 holosystolic LLSB murmur, no diastolic murmurs, rubs, or gallops.  Respiratory: Clear to auscultation bilaterally. GI: Soft, nontender, non-distended  MS:  1+ ankle edema; No deformity. Neuro:  Nonfocal  Psych: Normal affect   Labs    High Sensitivity Troponin:   Recent Labs  Lab 06/17/20 1607 07/06/2020 1115 06/15/2020 1401  TROPONINIHS 38* 42* 53*      Chemistry Recent Labs  Lab 06/16/2020 1121 06/19/20 0046 06/19/20 1200 06/19/20 1848 06/27/2020 0102  NA 132* 125* 131* 130* 129*  K 3.2* 2.8*  --   --  4.6  CL 94* 92*  --   --  98  CO2 22 17*  --   --  21*  GLUCOSE 85 70  --   --  109*  BUN 15 16  --   --  16  CREATININE 1.95* 1.99*  --   --  1.48*  CALCIUM 8.6* 7.6*  --   --  8.3*  PROT 5.8*  --   --   --   --   ALBUMIN 3.4*  --   --   --   --   AST 35  --   --   --   --   ALT 16  --   --   --   --   ALKPHOS 136*  --   --   --   --   BILITOT 1.4*  --   --   --   --  GFRNONAA 26* 26*  --   --  36*  ANIONGAP 16* 16*  --   --  10     Hematology Recent Labs  Lab 06/23/2020 1115 06/19/20 0046 06/16/2020 0102  WBC 22.0* 20.5* 20.7*  RBC 4.66 4.25 4.16  HGB 11.2* 10.3* 10.0*  HCT 40.3 35.8* 34.6*  MCV 86.5 84.2 83.2  MCH 24.0* 24.2* 24.0*  MCHC 27.8* 28.8* 28.9*  RDW 21.0* 20.7* 20.5*  PLT 544* 523* 544*    BNP Recent Labs  Lab 06/24/2020 1124  BNP 1,162.3*     DDimer No results for input(s): DDIMER in the last 168 hours.   Radiology    DG Chest Port 1 View  Result Date: 06/23/2020 CLINICAL DATA:  Sepsis.  Chest pain. EXAM: PORTABLE CHEST 1 VIEW COMPARISON:  06/17/2020. FINDINGS: Mediastinum and hilar structures normal. Cardiomegaly. Interim significant clearing of bilateral interstitial prominence suggesting clearing CHF. Mild residual. Persistent left base atelectasis/infiltrate. Interim near complete clearing of right pleural effusion. Persistent moderate left-sided pleural effusion. No pneumothorax. IMPRESSION: 1. Cardiomegaly. Interim significant clearing of bilateral interstitial prominence suggesting clearing CHF. Mild residual. 2. Persistent left base atelectasis/infiltrate and moderate left pleural effusion.  Electronically Signed   By: Thomas  Register   On: 07/05/2020 13:44   ECHOCARDIOGRAM COMPLETE  Result Date: 06/19/2020    ECHOCARDIOGRAM REPORT   Patient Name:   Crystal [name] Date of Exam: 06/19/2020 Medical Rec #:  [id number]    Height:       63.0 in Accession #:    [id number]   Weight:       119.9 lb Date of Birth:  04/21/1946     BSA:          1.556 m Patient Age:    74 years     BP:           121/51 mmHg Patient Gender: F            HR:           84 bpm. Exam Location:  Inpatient Procedure: 2D Echo, 3D Echo, Cardiac Doppler and Color Doppler Indications:    R94.31 Abnormal EKG  History:        Patient has prior history of Echocardiogram examinations, most                 recent 05/02/2018. Abnormal ECG, Arrythmias:Atrial Fibrillation;                 Risk Factors:Dyslipidemia and Hypertension. Elevated troponin.  Sonographer:    Tina West RDCS Referring Phys: [id number] SUMAYYA AMIN IMPRESSIONS  1. There is significant change since the study on 05/02/2018. LVEF has decreased from 55-60% to 35-40% with diffuse hypokinesis. RV is newly severely dilated with severe systolic dysfunction, severe tricuspid regurgitation and moderate pulmonary hypertension RVSP 48 mmHg. Large left pleural effusion.  2. Left ventricular ejection fraction, by estimation, is 35 to 40%. The left ventricle has moderately decreased function. The left ventricle has no regional wall motion abnormalities. There is mild concentric left ventricular hypertrophy. Left ventricular diastolic function could not be evaluated. There is the interventricular septum is flattened in systole and diastole, consistent with right ventricular pressure and volume overload.  3. Right ventricular systolic function is severely reduced. The right ventricular size is severely enlarged. There is moderately elevated pulmonary artery systolic pressure. The estimated right ventricular systolic pressure is 47.5 mmHg.  4. Right atrial size was moderately dilated.  5. Large  pleural effusion in the left   lateral region.  6. The mitral valve is normal in structure. Moderate mitral valve regurgitation. No evidence of mitral stenosis.  7. Tricuspid valve regurgitation is severe.  8. The aortic valve is normal in structure. Aortic valve regurgitation is not visualized. No aortic stenosis is present.  9. The inferior vena cava is dilated in size with <50% respiratory variability, suggesting right atrial pressure of 15 mmHg. FINDINGS  Left Ventricle: Left ventricular ejection fraction, by estimation, is 35 to 40%. The left ventricle has moderately decreased function. The left ventricle has no regional wall motion abnormalities. The left ventricular internal cavity size was normal in size. There is mild concentric left ventricular hypertrophy. The interventricular septum is flattened in systole and diastole, consistent with right ventricular pressure and volume overload. Left ventricular diastolic function could not be evaluated due to atrial fibrillation. Left ventricular diastolic function could not be evaluated. Right Ventricle: The right ventricular size is severely enlarged. No increase in right ventricular wall thickness. Right ventricular systolic function is severely reduced. There is moderately elevated pulmonary artery systolic pressure. The tricuspid regurgitant velocity is 2.85 m/s, and with an assumed right atrial pressure of 15 mmHg, the estimated right ventricular systolic pressure is 76.1 mmHg. Left Atrium: Left atrial size was normal in size. Right Atrium: Right atrial size was moderately dilated. Pericardium: There is no evidence of pericardial effusion. Mitral Valve: The mitral valve is normal in structure. Moderate mitral valve regurgitation. No evidence of mitral valve stenosis. Tricuspid Valve: The tricuspid valve is normal in structure. Tricuspid valve regurgitation is severe. No evidence of tricuspid stenosis. Aortic Valve: The aortic valve is normal in structure. Aortic  valve regurgitation is not visualized. No aortic stenosis is present. Pulmonic Valve: The pulmonic valve was normal in structure. Pulmonic valve regurgitation is not visualized. No evidence of pulmonic stenosis. Aorta: The aortic root is normal in size and structure. Venous: The inferior vena cava is dilated in size with less than 50% respiratory variability, suggesting right atrial pressure of 15 mmHg. IAS/Shunts: No atrial level shunt detected by color flow Doppler. Additional Comments: There is a large pleural effusion in the left lateral region.  LEFT VENTRICLE PLAX 2D LVIDd:         3.60 cm LVIDs:         3.10 cm LV PW:         1.50 cm LV IVS:        1.10 cm LVOT diam:     1.70 cm LV SV:         39 LV SV Index:   25 LVOT Area:     2.27 cm  LV Volumes (MOD) LV vol d, MOD A2C: 40.1 ml LV vol d, MOD A4C: 37.4 ml LV vol s, MOD A2C: 25.7 ml LV vol s, MOD A4C: 23.0 ml LV SV MOD A2C:     14.4 ml LV SV MOD A4C:     37.4 ml LV SV MOD BP:      18.8 ml RIGHT VENTRICLE         IVC TAPSE (M-mode): 0.8 cm  IVC diam: 2.40 cm LEFT ATRIUM           Index       RIGHT ATRIUM           Index LA diam:      3.50 cm 2.25 cm/m  RA Area:     18.90 cm LA Vol (A2C): 20.3 ml 13.05 ml/m RA Volume:   58.30 ml  37.47 ml/m LA Vol (A4C): 35.0 ml 22.49 ml/m  AORTIC VALVE LVOT Vmax:   116.00 cm/s LVOT Vmean:  75.800 cm/s LVOT VTI:    0.172 m  AORTA Ao Root diam: 2.50 cm Ao Asc diam:  2.90 cm MITRAL VALVE               TRICUSPID VALVE MV Area (PHT): 5.27 cm    TR Peak grad:   32.5 mmHg MV Decel Time: 144 msec    TR Vmax:        285.00 cm/s MV E velocity: 55.27 cm/s                            SHUNTS                            Systemic VTI:  0.17 m                            Systemic Diam: 1.70 cm Ena Dawley MD Electronically signed by Ena Dawley MD Signature Date/Time: 06/19/2020/12:21:34 PM    Final     Cardiac Studies   Echo: 06/19/20  1. There is significant change since the study on 05/02/2018. LVEF has  decreased from  55-60% to 35-40% with diffuse hypokinesis. RV is newly  severely dilated with severe systolic dysfunction, severe tricuspid  regurgitation and moderate pulmonary  hypertension RVSP 48 mmHg. Large left pleural effusion.  2. Left ventricular ejection fraction, by estimation, is 35 to 40%. The  left ventricle has moderately decreased function. The left ventricle has  no regional wall motion abnormalities. There is mild concentric left  ventricular hypertrophy. Left  ventricular diastolic function could not be evaluated. There is the  interventricular septum is flattened in systole and diastole, consistent  with right ventricular pressure and volume overload.  3. Right ventricular systolic function is severely reduced. The right  ventricular size is severely enlarged. There is moderately elevated  pulmonary artery systolic pressure. The estimated right ventricular  systolic pressure is 67.2 mmHg. m 4. Right atrial size was moderately dilated.  5. Large pleural effusion in the left lateral region.  6. The mitral valve is normal in structure. Moderate mitral valve  regurgitation. No evidence of mitral stenosis.  7. Tricuspid valve regurgitation is severe.  8. The aortic valve is normal in structure. Aortic valve regurgitation is  not visualized. No aortic stenosis is present.  9. The inferior vena cava is dilated in size with <50% respiratory  variability, suggesting right atrial pressure of 15 mmHg.   Patient Profile     75 y.o. 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   Assessment & Plan    1. Atrial Flutter with RVR: Previous AFib. For TEE-DCCV today. On Eliquis, 3rd dose today. 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 Difficult rate control despite IV diltiazem. Add very low dose metoprolol. Plan  dofetilide once renal function back to baseline. CHA2DS2-VASc Score of at least 5.  2. Acute systolic HF: Echo this admission with decline in EF from 55-60% to 35-40% with diffuse hypokinesis along with new severe RV dysfunction, severe TR and moderate pulmonary HTN with RVSP 43mmHg. 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  3. Leukocytosis: Persistent,  but etiology unclear. CXR with possible left base infiltrate but she has been afebrile. Lactic trending down. Currently not treated with antibiotics. BC negative to date  4. Hypokalemia: Resolved. Critical to maintain normal K levels after starting dofetilide. Reports having several episodes of diarrhea likely 2/2 IBS flare last week. Also on daily lasix/K+ supplement at home.   5. AKI: improving rapidly.  6. 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 Theophylline may predispose to recurrent arrhythmia. She may tolerate a low dose of selective beta-1 blocker.  7. 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

## 2020-06-20 NOTE — Interval H&P Note (Signed)
History and Physical Interval Note:  07/03/2020 11:06 AM  Crystal [name]  has presented today for surgery, with the diagnosis of AFIB.  The various methods of treatment have been discussed with the patient and family. After consideration of risks, benefits and other options for treatment, the patient has consented to  Procedure(s): TRANSESOPHAGEAL ECHOCARDIOGRAM (TEE) (N/A) CARDIOVERSION (N/A) as a surgical intervention.  The patient's history has been reviewed, patient examined, no change in status, stable for surgery.  I have reviewed the patient's chart and labs.  Questions were answered to the patient's satisfaction.    TEE/DCCV for Aflutter. NPO. On eliquis ~3 doses.   [place] T. Audie Box, [place]  [address] [place], [place] [address] [phone]  11:07 AM

## 2020-06-20 NOTE — Anesthesia Postprocedure Evaluation (Signed)
Anesthesia Post Note  Patient: Crystal [name]  Procedure(s) Performed: TRANSESOPHAGEAL ECHOCARDIOGRAM (TEE) (N/A ) CARDIOVERSION (N/A )     Patient location during evaluation: PACU Anesthesia Type: General Level of consciousness: awake and alert, oriented and patient cooperative Pain management: pain level controlled Vital Signs Assessment: post-procedure vital signs reviewed and stable Respiratory status: spontaneous breathing, nonlabored ventilation and respiratory function stable Cardiovascular status: blood pressure returned to baseline and stable Postop Assessment: no apparent nausea or vomiting Anesthetic complications: no   No complications documented.  Last Vitals:  Vitals:   06/15/2020 1221 06/16/2020 1231  BP: (!) 104/58   Pulse: 88 88  Resp: 18 17  Temp:    SpO2: 90% 91%    Last Pain:  Vitals:   06/22/2020 1231  TempSrc:   PainSc: 0-No pain                 Pervis Hocking

## 2020-06-20 NOTE — Progress Notes (Addendum)
Electrophysiology Rounding Note  Patient Name: Crystal [name] Date of Encounter: 07/12/2020  Primary Cardiologist: [name], MD Electrophysiologist: New  Subjective   The patient is doing well today.  At this time, the patient denies chest pain, shortness of breath, or any new concerns.  She verbalizes understanding of the plan for TEE/DCC today and then to follow renal function for AAD consideration.  Inpatient Medications    Scheduled Meds: . apixaban  5 mg Oral BID  . furosemide  40 mg Intravenous Daily  . gabapentin  600 mg Oral QHS  . levothyroxine  75 mcg Oral Q0600  . LORazepam  1 mg Oral QHS  . montelukast  10 mg Oral QHS  . theophylline  200 mg Oral BID   Continuous Infusions: . sodium chloride 20 mL/hr at 07/03/2020 0325  . diltiazem (CARDIZEM) infusion 7.5 mg/hr (25-Jun-2020 0514)  . lactated ringers     PRN Meds: acetaminophen, ondansetron (ZOFRAN) IV, traMADol   Vital Signs    Vitals:   07/10/2020 0331 07/07/2020 0400 07/05/2020 0500 07/14/2020 0600  BP: 108/76 112/64 109/63 113/72  Pulse: (!) 123 (!) 123 (!) 142 (!) 126  Resp: 17 18 18 16   Temp: 98.2 F (36.8 C)     TempSrc: Oral     SpO2: 94%     Weight:    66.2 kg  Height:        Intake/Output Summary (Last 24 hours) at 07/11/2020 0813 Last data filed at 06/26/2020 0325 Gross per 24 hour  Intake 459.24 ml  Output 60 ml  Net 399.24 ml   Filed Weights   06/30/2020 1109 07/01/2020 0600  Weight: 54.4 kg 66.2 kg    Physical Exam    GEN- The patient is elderly appearing, alert and oriented x 3 today.   Head- normocephalic, atraumatic Eyes-  Sclera clear, conjunctiva pink Ears- hearing intact Oropharynx- clear Neck- supple Lungs- Clear to ausculation bilaterally, normal work of breathing Heart- Irregular irregular rate and rhythm, no murmurs, rubs or gallops GI- soft, NT, ND, + BS Extremities- no clubbing or cyanosis. No edema Skin- no rash or lesion Psych- euthymic mood, full affect Neuro- strength  and sensation are intact  Labs    CBC Recent Labs    07/01/2020 1115 06/19/20 0046 07/14/2020 0102  WBC 22.0* 20.5* 20.7*  NEUTROABS 16.0*  --   --   HGB 11.2* 10.3* 10.0*  HCT 40.3 35.8* 34.6*  MCV 86.5 84.2 83.2  PLT 544* 523* 165*   Basic Metabolic Panel Recent Labs    06/30/2020 1121 06/19/20 0046 06/19/20 0846 06/19/20 1200 06/19/20 1848 25-Jun-2020 0102  NA 132* 125*  --    < > 130* 129*  K 3.2* 2.8*  --   --   --  4.6  CL 94* 92*  --   --   --  98  CO2 22 17*  --   --   --  21*  GLUCOSE 85 70  --   --   --  109*  BUN 15 16  --   --   --  16  CREATININE 1.95* 1.99*  --   --   --  1.48*  CALCIUM 8.6* 7.6*  --   --   --  8.3*  MG 1.7  --  1.9  --   --   --    < > = values in this interval not displayed.   Liver Function Tests Recent Labs    07/05/2020 1121  AST 35  ALT 16  ALKPHOS 136*  BILITOT 1.4*  PROT 5.8*  ALBUMIN 3.4*   No results for input(s): LIPASE, AMYLASE in the last 72 hours. Cardiac Enzymes No results for input(s): CKTOTAL, CKMB, CKMBINDEX, TROPONINI in the last 72 hours.   Telemetry    AFL in 110s (personally reviewed)  Radiology    DG Chest Port 1 View  Result Date: 07/14/2020 CLINICAL DATA:  Sepsis.  Chest pain. EXAM: PORTABLE CHEST 1 VIEW COMPARISON:  06/17/2020. FINDINGS: Mediastinum and hilar structures normal. Cardiomegaly. Interim significant clearing of bilateral interstitial prominence suggesting clearing CHF. Mild residual. Persistent left base atelectasis/infiltrate. Interim near complete clearing of right pleural effusion. Persistent moderate left-sided pleural effusion. No pneumothorax. IMPRESSION: 1. Cardiomegaly. Interim significant clearing of bilateral interstitial prominence suggesting clearing CHF. Mild residual. 2. Persistent left base atelectasis/infiltrate and moderate left pleural effusion. Electronically Signed   By: Marcello Moores  Register   On: 06/17/2020 13:44   ECHOCARDIOGRAM COMPLETE  Result Date: 06/19/2020    ECHOCARDIOGRAM  REPORT   Patient Name:   Crystal [name] Date of Exam: 06/19/2020 Medical Rec #:  [id number]    Height:       63.0 in Accession #:    [id number]   Weight:       119.9 lb Date of Birth:  [birth year]     BSA:          1.556 m Patient Age:    75 years     BP:           121/51 mmHg Patient Gender: F            HR:           84 bpm. Exam Location:  Inpatient Procedure: 2D Echo, 3D Echo, Cardiac Doppler and Color Doppler Indications:    R94.31 Abnormal EKG  History:        Patient has prior history of Echocardiogram examinations, most                 recent 05/02/2018. Abnormal ECG, Arrythmias:Atrial Fibrillation;                 Risk Factors:Dyslipidemia and Hypertension. Elevated troponin.  Sonographer:    Roseanna Rainbow RDCS Referring Phys: [id number] [place]  1. There is significant change since the study on 05/02/2018. LVEF has decreased from 55-60% to 35-40% with diffuse hypokinesis. RV is newly severely dilated with severe systolic dysfunction, severe tricuspid regurgitation and moderate pulmonary hypertension RVSP 48 mmHg. Large left pleural effusion.  2. Left ventricular ejection fraction, by estimation, is 35 to 40%. The left ventricle has moderately decreased function. The left ventricle has no regional wall motion abnormalities. There is mild concentric left ventricular hypertrophy. Left ventricular diastolic function could not be evaluated. There is the interventricular septum is flattened in systole and diastole, consistent with right ventricular pressure and volume overload.  3. Right ventricular systolic function is severely reduced. The right ventricular size is severely enlarged. There is moderately elevated pulmonary artery systolic pressure. The estimated right ventricular systolic pressure is 60.6 mmHg.  4. Right atrial size was moderately dilated.  5. Large pleural effusion in the left lateral region.  6. The mitral valve is normal in structure. Moderate mitral valve regurgitation. No evidence of  mitral stenosis.  7. Tricuspid valve regurgitation is severe.  8. The aortic valve is normal in structure. Aortic valve regurgitation is not visualized. No aortic stenosis is present.  9. The inferior vena cava is  dilated in size with <50% respiratory variability, suggesting right atrial pressure of 15 mmHg. FINDINGS  Left Ventricle: Left ventricular ejection fraction, by estimation, is 35 to 40%. The left ventricle has moderately decreased function. The left ventricle has no regional wall motion abnormalities. The left ventricular internal cavity size was normal in size. There is mild concentric left ventricular hypertrophy. The interventricular septum is flattened in systole and diastole, consistent with right ventricular pressure and volume overload. Left ventricular diastolic function could not be evaluated due to atrial fibrillation. Left ventricular diastolic function could not be evaluated. Right Ventricle: The right ventricular size is severely enlarged. No increase in right ventricular wall thickness. Right ventricular systolic function is severely reduced. There is moderately elevated pulmonary artery systolic pressure. The tricuspid regurgitant velocity is 2.85 m/s, and with an assumed right atrial pressure of 15 mmHg, the estimated right ventricular systolic pressure is [id number] mmHg. Left Atrium: Left atrial size was normal in size. Right Atrium: Right atrial size was moderately dilated. Pericardium: There is no evidence of pericardial effusion. Mitral Valve: The mitral valve is normal in structure. Moderate mitral valve regurgitation. No evidence of mitral valve stenosis. Tricuspid Valve: The tricuspid valve is normal in structure. Tricuspid valve regurgitation is severe. No evidence of tricuspid stenosis. Aortic Valve: The aortic valve is normal in structure. Aortic valve regurgitation is not visualized. No aortic stenosis is present. Pulmonic Valve: The pulmonic valve was normal in structure. Pulmonic  valve regurgitation is not visualized. No evidence of pulmonic stenosis. Aorta: The aortic root is normal in size and structure. Venous: The inferior vena cava is dilated in size with less than 50% respiratory variability, suggesting right atrial pressure of 15 mmHg. IAS/Shunts: No atrial level shunt detected by color flow Doppler. Additional Comments: There is a large pleural effusion in the left lateral region.  LEFT VENTRICLE PLAX 2D LVIDd:         3.60 cm LVIDs:         3.10 cm LV PW:         1.50 cm LV IVS:        1.10 cm LVOT diam:     1.70 cm LV SV:         39 LV SV Index:   25 LVOT Area:     2.27 cm  LV Volumes (MOD) LV vol d, MOD A2C: 40.1 ml LV vol d, MOD A4C: 37.4 ml LV vol s, MOD A2C: 25.7 ml LV vol s, MOD A4C: 23.0 ml LV SV MOD A2C:     14.4 ml LV SV MOD A4C:     37.4 ml LV SV MOD BP:      18.8 ml RIGHT VENTRICLE         IVC TAPSE (M-mode): 0.8 cm  IVC diam: 2.40 cm LEFT ATRIUM           Index       RIGHT ATRIUM           Index LA diam:      3.50 cm 2.25 cm/m  RA Area:     18.90 cm LA Vol (A2C): 20.3 ml 13.05 ml/m RA Volume:   58.30 ml  37.47 ml/m LA Vol (A4C): 35.0 ml 22.49 ml/m  AORTIC VALVE LVOT Vmax:   116.00 cm/s LVOT Vmean:  75.800 cm/s LVOT VTI:    0.172 m  AORTA Ao Root diam: 2.50 cm Ao Asc diam:  2.90 cm MITRAL VALVE  TRICUSPID VALVE MV Area (PHT): 5.27 cm    TR Peak grad:   32.5 mmHg MV Decel Time: 144 msec    TR Vmax:        285.00 cm/s MV E velocity: 55.27 cm/s                            SHUNTS                            Systemic VTI:  0.17 m                            Systemic Diam: 1.70 cm Ena Dawley MD Electronically signed by Ena Dawley MD Signature Date/Time: 06/19/2020/12:21:34 PM    Final     Patient Profile     Crystal [name] is a 75 y.o. 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 Sallyanne Kuster.  Assessment & Plan    1. Paroxysmal AFib, atypical Aflutter     CHA2DS2Vasc is 8, resumed on a/c this admission, heparin gtt yesterday > Eliquis 06/19/20     Planned for TEE/DCCV this am.   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  Would recommend considering Tikosyn once her electrolytes and renal function allow. The hope is that her Cr may continue to improve once she is back in NSR.  Lytes need to be: K+ 4.0 or better and mag 2.0 or better Pharmacy consulted to review her meds, though don't see any contraindicated meds  Continue ongoing management with primary team EP will follow along though she may not be ready for tikosyn for several more days.   For questions or updates, please contact [place] Please consult [email or web address] for contact info under Cardiology/STEMI.  Signed, Shirley Friar, PA-C  07/02/2020, 8:13 AM   EP Attending  Patient seen and examined. Agree with above. The patient is better today. Her renal function is improved and she appears euvolemic. I would recommend we proceed with TEE guided DCCV and if her QT in NSR is ok, proceed with initiation of dofetilide at 250 q12. I would expect her renal function to continue to improve.  Carleene Overlie Giankarlo Leamer,MD

## 2020-06-20 NOTE — Anesthesia Procedure Notes (Signed)
Procedure Name: MAC Date/Time: 07/14/2020 11:17 AM Performed by: Kathryne Hitch, CRNA Pre-anesthesia Checklist: Patient identified, Emergency Drugs available, Suction available and Patient being monitored Patient Re-evaluated:Patient Re-evaluated prior to induction Oxygen Delivery Method: Nasal cannula Placement Confirmation: positive ETCO2 Dental Injury: Teeth and Oropharynx as per pre-operative assessment

## 2020-06-20 NOTE — Anesthesia Postprocedure Evaluation (Signed)
Anesthesia Post Note  Patient: Crystal [name]  Procedure(s) Performed: TRANSESOPHAGEAL ECHOCARDIOGRAM (TEE) (N/A ) CARDIOVERSION (N/A )     Patient location during evaluation: PACU Anesthesia Type: General Level of consciousness: awake and alert, oriented and patient cooperative Pain management: pain level controlled Vital Signs Assessment: post-procedure vital signs reviewed and stable Respiratory status: spontaneous breathing, nonlabored ventilation and respiratory function stable Cardiovascular status: blood pressure returned to baseline and stable Postop Assessment: no apparent nausea or vomiting Anesthetic complications: no Comments: On 6L [place] preop saturating 92-95%, given one duoneb in pacu for saturations in the high 85I   No complications documented.  Last Vitals:  Vitals:   06/22/2020 1221 06/29/2020 1231  BP: (!) 104/58   Pulse: 88 88  Resp: 18 17  Temp:    SpO2: 90% 91%    Last Pain:  Vitals:   06/29/2020 1231  TempSrc:   PainSc: 0-No pain                 Pervis Hocking

## 2020-06-20 NOTE — Progress Notes (Signed)
PROGRESS NOTE    Crystal [name]  [id number] DOB: 10/04/1945 DOA: 06/22/2020 PCP: [name], MD   Brief Narrative:  HPI: Crystal [name] is a 75 y.o. female with medical history significant of afib, s/p cardioversion about 2 years ago, no longer on anticoagulation, IBS, IPF, hypertension, hyperlipidemia and peripheral vascular disease came to ED with generalized weakness and palpitations.  According to patient and her husband she developed palpitations yesterday, husband noticed high heart rate and gave her his metoprolol that help with the heart rate but dropped her blood pressure.  She came to ED for evaluation and then left without being seen due to long wait time.  This morning she was feeling more weak and continue to have palpitations, talked with her PCP who also evaluated her labs drawn while she was here yesterday which shows mildly elevated troponin and AKI and advised her to come back to ED for further evaluation.  Patient denies any chest pain or shortness of breath.  No orthopnea or PND.  Denies any upper respiratory symptoms or sick contacts.  No current nausea, vomiting or diarrhea.  Apparently she had an history of IBS and had nausea, vomiting and diarrhea couple of weeks ago and received a course of Augmentin for possible diverticulitis.  Symptoms has been resolved since then.  No fever or chills.  No urinary symptoms.  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  He was not on any anticoagulation for a long time now stating that she was experiencing recurrent GI bleed.  Did not had A. fib since she had her cardioversion approximately 2 years ago.  ED Course: On arrival she was found to be in A. fib/flutter with RVR, labs pertinent for leukocytosis of 22, sodium of 132, potassium of 3.2, creatinine of 1.95, troponin of 42>>53, lactic acid of 2.3, COVID-19 test pending, chest x-ray with small bilateral pleural effusions, left greater than right, left base  atelectasis/infiltrate.  Assessment & Plan:   Active Problems:   Atrial fibrillation with rapid ventricular response (HCC)   Atrial fibrillation with controlled ventricular rate (HCC)   A. fib with RVR.  Patient had a prior history of paroxysmal atrial fibrillation and a prior cardioversion.  Not on any chronic anticoagulation due to GI bleeds.  Seen by cardiology.  Heparin switched to Eliquis and Cardizem continued.  She is scheduled for DCCV cardioversion today.  Appreciate cardiology help.  Elevated troponin: Slightly elevated but flat troponin without having chest pain or shortness of breath does not indicate ACS and instead possible demand ischemia.  Echo with normal ejection fraction and no wall motion abnormality..  Chronic leukocytosis: Chest x-ray with concern of left base infiltrate, patient afebrile and no upper respiratory symptoms so doubt pneumonia.  Minimally elevated procalcitonin.  Watch off of antibiotics.  She has chronic leukocytosis that is a stable.  Lactic acidosis.  Most likely secondary to hypoxia with A. fib with  RVR.We will recheck again today.  AKI.  Creatinine at 1.95 with baseline below 1.  Some improvement today.  Hopefully with control of A. fib, this will even improve more tomorrow.  Continue to avoid nephrotoxic agents.  Hypokalemia.  Resolved.  Pleural effusion.  Chest x-ray concerning for bilateral pleural effusion left more than right.  No prior diagnosis of heart failure but can be due to CHF in the setting of A. fib with RVR.  Clinically appears volume overloaded.  BNP elevated.  Echo done but results pending.  Continue Lasix  40 mg IV daily.  Strict I's and O's.  History of IBS.  Patient do get flares, no acute concern.  Most recent flare was approximately 2 weeks ago. Continue to monitor  Acute hyponatremia: Has remained stable around 129.  DVT prophylaxis: Heparin drip.   Code Status: DNR  Family Communication:  None present at bedside.   Plan of care discussed with patient in length and he verbalized understanding and agreed with it.  Status is: Inpatient  Remains inpatient appropriate because:Ongoing diagnostic testing needed not appropriate for outpatient work up   Dispo: The patient is from: Home              Anticipated d/c is to: Home              Anticipated d/c date is: 1 day              Patient currently is not medically stable to d/c.             Estimated body mass index is 25.85 kg/m as calculated from the following:   Height as of this encounter: 5\' 3"  (1.6 m).   Weight as of this encounter: 66.2 kg.      Nutritional status:               Consultants:   Cardiology  Procedures:   None  Antimicrobials:  Anti-infectives (From admission, onward)   None         Subjective: Seen and examined.  Her husband at the bedside.  She had no complaint but she was nervous due to procedure pending.  Denied any palpitation, chest pain or shortness of breath..  Objective: Vitals:   07/03/2020 0830 07/12/2020 1030 06/28/2020 1031 06/28/2020 1045  BP:  115/68 115/68 120/70  Pulse: (!) 111 (!) 111 (!) 111 (!) 110  Resp:  (!) 24  19  Temp:  99.1 F (37.3 C)  97.8 F (36.6 C)  TempSrc:  Oral  Oral  SpO2:    95%  Weight:      Height:        Intake/Output Summary (Last 24 hours) at 07/06/2020 1103 Last data filed at 07/09/2020 0325 Gross per 24 hour  Intake 459.24 ml  Output 60 ml  Net 399.24 ml   Filed Weights   06/19/2020 1109 07/02/2020 0600  Weight: 54.4 kg 66.2 kg    Examination:  General exam: Appears calm and comfortable  Respiratory system: Clear to auscultation. Respiratory effort normal. Cardiovascular system: S1 & S2 heard, irregularly irregular rate and rhythm. No JVD, murmurs, rubs, gallops or clicks. No pedal edema. Gastrointestinal system: Abdomen is nondistended, soft and nontender. No organomegaly or masses felt. Normal bowel sounds heard. Central nervous system:  Alert and oriented. No focal neurological deficits. Extremities: Symmetric 5 x 5 power. Skin: No rashes, lesions or ulcers.  Psychiatry: Judgement and insight appear normal. Mood & affect appropriate.   Data Reviewed: I have personally reviewed following labs and imaging studies  CBC: Recent Labs  Lab 06/17/20 1607 06/30/2020 1115 06/19/20 0046 07/11/2020 0102  WBC 15.6* 22.0* 20.5* 20.7*  NEUTROABS  --  16.0*  --   --   HGB 11.1* 11.2* 10.3* 10.0*  HCT 37.2 40.3 35.8* 34.6*  MCV 84.5 86.5 84.2 83.2  PLT 687* 544* 523* 0000000*   Basic Metabolic Panel: Recent Labs  Lab 06/17/20 1607 06/24/2020 1121 06/19/20 0046 06/19/20 0846 06/19/20 1200 06/19/20 1848 07/01/2020 0102  NA 131* 132* 125*  --  131*  130* 129*  K 3.3* 3.2* 2.8*  --   --   --  4.6  CL 94* 94* 92*  --   --   --  98  CO2 21* 22 17*  --   --   --  21*  GLUCOSE 137* 85 70  --   --   --  109*  BUN 12 15 16   --   --   --  16  CREATININE 1.26* 1.95* 1.99*  --   --   --  1.48*  CALCIUM 8.5* 8.6* 7.6*  --   --   --  8.3*  MG  --  1.7  --  1.9  --   --   --    GFR: Estimated Creatinine Clearance: 29.6 mL/min (A) (by C-G formula based on SCr of 1.48 mg/dL (H)). Liver Function Tests: Recent Labs  Lab 06-29-2020 1121  AST 35  ALT 16  ALKPHOS 136*  BILITOT 1.4*  PROT 5.8*  ALBUMIN 3.4*   No results for input(s): LIPASE, AMYLASE in the last 168 hours. No results for input(s): AMMONIA in the last 168 hours. Coagulation Profile: Recent Labs  Lab 07/10/2020 0102  INR 1.2   Cardiac Enzymes: No results for input(s): CKTOTAL, CKMB, CKMBINDEX, TROPONINI in the last 168 hours. BNP (last 3 results) No results for input(s): PROBNP in the last 8760 hours. HbA1C: No results for input(s): HGBA1C in the last 72 hours. CBG: No results for input(s): GLUCAP in the last 168 hours. Lipid Profile: No results for input(s): CHOL, HDL, LDLCALC, TRIG, CHOLHDL, LDLDIRECT in the last 72 hours. Thyroid Function Tests: Recent Labs     June 29, 2020 1818 Jun 29, 2020 2148  TSH 3.778  --   FREET4  --  1.09   Anemia Panel: No results for input(s): VITAMINB12, FOLATE, FERRITIN, TIBC, IRON, RETICCTPCT in the last 72 hours. Sepsis Labs: Recent Labs  Lab 2020/06/29 1124 June 29, 2020 1401 06-29-20 1818 06/19/20 1257 06/19/20 1620  PROCALCITON 0.21  --   --   --   --   LATICACIDVEN  --  2.3* 2.0* 2.3* 3.0*    Recent Results (from the past 240 hour(s))  Culture, blood (routine x 2)     Status: None (Preliminary result)   Collection Time: 2020-06-29  2:01 PM   Specimen: BLOOD  Result Value Ref Range Status   Specimen Description BLOOD LEFT ANTECUBITAL  Final   Special Requests   Final    BOTTLES DRAWN AEROBIC AND ANAEROBIC Blood Culture adequate volume   Culture   Final    NO GROWTH < 24 HOURS Performed at [facility], [place] [address], [place], [place] [address]    Report Status PENDING  Incomplete  Resp Panel by RT-PCR (Flu A&B, Covid) Nasopharyngeal Swab     Status: None   Collection Time: 06-29-20  6:28 PM   Specimen: Nasopharyngeal Swab; Nasopharyngeal(NP) swabs in vial transport medium  Result Value Ref Range Status   SARS Coronavirus 2 by RT PCR NEGATIVE NEGATIVE Final    Comment: (NOTE) SARS-CoV-2 target nucleic acids are NOT DETECTED.  The SARS-CoV-2 RNA is generally detectable in upper respiratory specimens during the acute phase of infection. The lowest concentration of SARS-CoV-2 viral copies this assay can detect is 138 copies/mL. A negative result does not preclude SARS-Cov-2 infection and should not be used as the sole basis for treatment or other patient management decisions. A negative result may occur with  improper specimen collection/handling, submission of specimen other than nasopharyngeal  swab, presence of viral mutation(s) within the areas targeted by this assay, and inadequate number of viral copies(<138 copies/mL). A negative result must be combined with clinical observations, patient history,  and epidemiological information. The expected result is Negative.  Fact Sheet for Patients:  EntrepreneurPulse.com.au  Fact Sheet for Healthcare Providers:  IncredibleEmployment.be  This test is no t yet approved or cleared by the [place] FDA and  has been authorized for detection and/or diagnosis of SARS-CoV-2 by FDA under an Emergency Use Authorization (EUA). This EUA will remain  in effect (meaning this test can be used) for the duration of the COVID-19 declaration under Section 564(b)(1) of the Act, 21 U.S.C.section 360bbb-3(b)(1), unless the authorization is terminated  or revoked sooner.       Influenza A by PCR NEGATIVE NEGATIVE Final   Influenza B by PCR NEGATIVE NEGATIVE Final    Comment: (NOTE) The Xpert Xpress SARS-CoV-2/FLU/RSV plus assay is intended as an aid in the diagnosis of influenza from Nasopharyngeal swab specimens and should not be used as a sole basis for treatment. Nasal washings and aspirates are unacceptable for Xpert Xpress SARS-CoV-2/FLU/RSV testing.  Fact Sheet for Patients: EntrepreneurPulse.com.au  Fact Sheet for Healthcare Providers: IncredibleEmployment.be  This test is not yet approved or cleared by the [place] FDA and has been authorized for detection and/or diagnosis of SARS-CoV-2 by FDA under an Emergency Use Authorization (EUA). This EUA will remain in effect (meaning this test can be used) for the duration of the COVID-19 declaration under Section 564(b)(1) of the Act, 21 U.S.C. section 360bbb-3(b)(1), unless the authorization is terminated or revoked.  Performed at [facility], [place] [address], [place], [place] [address]   Culture, blood (routine x 2)     Status: None (Preliminary result)   Collection Time: 07/13/2020  7:44 PM   Specimen: BLOOD  Result Value Ref Range Status   Specimen Description BLOOD LEFT ANTECUBITAL  Final   Special  Requests   Final    BOTTLES DRAWN AEROBIC AND ANAEROBIC Blood Culture adequate volume   Culture   Final    NO GROWTH < 24 HOURS Performed at [facility], [place] [address], [place], [place] [address]    Report Status PENDING  Incomplete  MRSA PCR Screening     Status: None   Collection Time: 06/28/2020 10:00 PM   Specimen: Nasal Mucosa; Nasopharyngeal  Result Value Ref Range Status   MRSA by PCR NEGATIVE NEGATIVE Final    Comment:        The GeneXpert MRSA Assay (FDA approved for NASAL specimens only), is one component of a comprehensive MRSA colonization surveillance program. It is not intended to diagnose MRSA infection nor to guide or monitor treatment for MRSA infections. Performed at [facility], [place] [address], [place], [place] [address]       Radiology Studies: DG Chest Port 1 View  Result Date: 07/05/2020 CLINICAL DATA:  Sepsis.  Chest pain. EXAM: PORTABLE CHEST 1 VIEW COMPARISON:  06/17/2020. FINDINGS: Mediastinum and hilar structures normal. Cardiomegaly. Interim significant clearing of bilateral interstitial prominence suggesting clearing CHF. Mild residual. Persistent left base atelectasis/infiltrate. Interim near complete clearing of right pleural effusion. Persistent moderate left-sided pleural effusion. No pneumothorax. IMPRESSION: 1. Cardiomegaly. Interim significant clearing of bilateral interstitial prominence suggesting clearing CHF. Mild residual. 2. Persistent left base atelectasis/infiltrate and moderate left pleural effusion. Electronically Signed   By: Marcello Moores  Register   On: 06/21/2020 13:44   ECHOCARDIOGRAM COMPLETE  Result Date:  06/19/2020    ECHOCARDIOGRAM REPORT   Patient Name:   SABRIA SEEKINGS Date of Exam: 06/19/2020 Medical Rec #:  [id number]    Height:       63.0 in Accession #:    [id number]   Weight:       119.9 lb Date of Birth:  02/19/1946     BSA:          1.556 m Patient Age:    53 years     BP:           121/51 mmHg Patient Gender: F             HR:           84 bpm. Exam Location:  Inpatient Procedure: 2D Echo, 3D Echo, Cardiac Doppler and Color Doppler Indications:    R94.31 Abnormal EKG  History:        Patient has prior history of Echocardiogram examinations, most                 recent 05/02/2018. Abnormal ECG, Arrythmias:Atrial Fibrillation;                 Risk Factors:Dyslipidemia and Hypertension. Elevated troponin.  Sonographer:    Roseanna Rainbow RDCS Referring Phys: [id number] [place]  1. There is significant change since the study on 05/02/2018. LVEF has decreased from 55-60% to 35-40% with diffuse hypokinesis. RV is newly severely dilated with severe systolic dysfunction, severe tricuspid regurgitation and moderate pulmonary hypertension RVSP 48 mmHg. Large left pleural effusion.  2. Left ventricular ejection fraction, by estimation, is 35 to 40%. The left ventricle has moderately decreased function. The left ventricle has no regional wall motion abnormalities. There is mild concentric left ventricular hypertrophy. Left ventricular diastolic function could not be evaluated. There is the interventricular septum is flattened in systole and diastole, consistent with right ventricular pressure and volume overload.  3. Right ventricular systolic function is severely reduced. The right ventricular size is severely enlarged. There is moderately elevated pulmonary artery systolic pressure. The estimated right ventricular systolic pressure is [id number] mmHg.  4. Right atrial size was moderately dilated.  5. Large pleural effusion in the left lateral region.  6. The mitral valve is normal in structure. Moderate mitral valve regurgitation. No evidence of mitral stenosis.  7. Tricuspid valve regurgitation is severe.  8. The aortic valve is normal in structure. Aortic valve regurgitation is not visualized. No aortic stenosis is present.  9. The inferior vena cava is dilated in size with <50% respiratory variability, suggesting right atrial pressure of  15 mmHg. FINDINGS  Left Ventricle: Left ventricular ejection fraction, by estimation, is 35 to 40%. The left ventricle has moderately decreased function. The left ventricle has no regional wall motion abnormalities. The left ventricular internal cavity size was normal in size. There is mild concentric left ventricular hypertrophy. The interventricular septum is flattened in systole and diastole, consistent with right ventricular pressure and volume overload. Left ventricular diastolic function could not be evaluated due to atrial fibrillation. Left ventricular diastolic function could not be evaluated. Right Ventricle: The right ventricular size is severely enlarged. No increase in right ventricular wall thickness. Right ventricular systolic function is severely reduced. There is moderately elevated pulmonary artery systolic pressure. The tricuspid regurgitant velocity is 2.85 m/s, and with an assumed right atrial pressure of 15 mmHg, the estimated right ventricular systolic pressure is 99991111 mmHg. Left Atrium: Left atrial size was normal in size. Right Atrium: Right atrial  size was moderately dilated. Pericardium: There is no evidence of pericardial effusion. Mitral Valve: The mitral valve is normal in structure. Moderate mitral valve regurgitation. No evidence of mitral valve stenosis. Tricuspid Valve: The tricuspid valve is normal in structure. Tricuspid valve regurgitation is severe. No evidence of tricuspid stenosis. Aortic Valve: The aortic valve is normal in structure. Aortic valve regurgitation is not visualized. No aortic stenosis is present. Pulmonic Valve: The pulmonic valve was normal in structure. Pulmonic valve regurgitation is not visualized. No evidence of pulmonic stenosis. Aorta: The aortic root is normal in size and structure. Venous: The inferior vena cava is dilated in size with less than 50% respiratory variability, suggesting right atrial pressure of 15 mmHg. IAS/Shunts: No atrial level shunt  detected by color flow Doppler. Additional Comments: There is a large pleural effusion in the left lateral region.  LEFT VENTRICLE PLAX 2D LVIDd:         3.60 cm LVIDs:         3.10 cm LV PW:         1.50 cm LV IVS:        1.10 cm LVOT diam:     1.70 cm LV SV:         39 LV SV Index:   25 LVOT Area:     2.27 cm  LV Volumes (MOD) LV vol d, MOD A2C: 40.1 ml LV vol d, MOD A4C: 37.4 ml LV vol s, MOD A2C: 25.7 ml LV vol s, MOD A4C: 23.0 ml LV SV MOD A2C:     14.4 ml LV SV MOD A4C:     37.4 ml LV SV MOD BP:      18.8 ml RIGHT VENTRICLE         IVC TAPSE (M-mode): 0.8 cm  IVC diam: 2.40 cm LEFT ATRIUM           Index       RIGHT ATRIUM           Index LA diam:      3.50 cm 2.25 cm/m  RA Area:     18.90 cm LA Vol (A2C): 20.3 ml 13.05 ml/m RA Volume:   58.30 ml  37.47 ml/m LA Vol (A4C): 35.0 ml 22.49 ml/m  AORTIC VALVE LVOT Vmax:   116.00 cm/s LVOT Vmean:  75.800 cm/s LVOT VTI:    0.172 m  AORTA Ao Root diam: 2.50 cm Ao Asc diam:  2.90 cm MITRAL VALVE               TRICUSPID VALVE MV Area (PHT): 5.27 cm    TR Peak grad:   32.5 mmHg MV Decel Time: 144 msec    TR Vmax:        285.00 cm/s MV E velocity: 55.27 cm/s                            SHUNTS                            Systemic VTI:  0.17 m                            Systemic Diam: 1.70 cm Ena Dawley MD Electronically signed by Ena Dawley MD Signature Date/Time: 06/19/2020/12:21:34 PM    Final     Scheduled Meds: . [MAR Hold] apixaban  5 mg Oral  BID  . [MAR Hold] furosemide  40 mg Intravenous Daily  . [MAR Hold] gabapentin  600 mg Oral QHS  . [MAR Hold] levothyroxine  75 mcg Oral Q0600  . [MAR Hold] LORazepam  1 mg Oral QHS  . [MAR Hold] metoprolol tartrate  12.5 mg Oral BID  . [MAR Hold] montelukast  10 mg Oral QHS  . [MAR Hold] theophylline  200 mg Oral BID   Continuous Infusions: . sodium chloride 20 mL/hr at 07/13/2020 0325  . diltiazem (CARDIZEM) infusion 7.5 mg/hr (07/14/2020 0514)  . [MAR Hold] lactated ringers       LOS: 1 day   Time  spent: 30 minutes   Darliss Cheney, MD [facility]  06/17/2020, 11:03 AM   To contact the attending provider between 7A-7P or the covering provider during after hours 7P-7A, please log into the web site [email or web address].

## 2020-06-20 NOTE — Plan of Care (Signed)
  Problem: Education: Goal: Knowledge of General Education information will improve Description: Including pain rating scale, medication(s)/side effects and non-pharmacologic comfort measures Outcome: Progressing   Problem: Health Behavior/Discharge Planning: Goal: Ability to manage health-related needs will improve Outcome: Progressing   Problem: Clinical Measurements: Goal: Ability to maintain clinical measurements within normal limits will improve Outcome: Progressing Goal: Will remain free from infection Outcome: Progressing Goal: Diagnostic test results will improve Outcome: Progressing Goal: Respiratory complications will improve Outcome: Progressing Goal: Cardiovascular complication will be avoided Outcome: Progressing   Problem: Activity: Goal: Risk for activity intolerance will decrease Outcome: Progressing   Problem: Nutrition: Goal: Adequate nutrition will be maintained Outcome: Progressing   Problem: Coping: Goal: Level of anxiety will decrease Outcome: Progressing   Problem: Elimination: Goal: Will not experience complications related to bowel motility Outcome: Progressing Goal: Will not experience complications related to urinary retention Outcome: Progressing   Problem: Pain Managment: Goal: General experience of comfort will improve Outcome: Progressing   Problem: Safety: Goal: Ability to remain free from injury will improve Outcome: Progressing   Problem: Skin Integrity: Goal: Risk for impaired skin integrity will decrease Outcome: Progressing   Problem: Education: Goal: Knowledge of disease or condition will improve Outcome: Progressing Goal: Understanding of medication regimen will improve Outcome: Progressing Goal: Individualized Educational Video(s) Outcome: Progressing   Problem: Activity: Goal: Ability to tolerate increased activity will improve Outcome: Progressing   Problem: Cardiac: Goal: Ability to achieve and maintain  adequate cardiopulmonary perfusion will improve Outcome: Progressing   

## 2020-06-20 NOTE — Progress Notes (Signed)
Pharmacy Review for Dofetilide (Tikosyn) Initiation  Admit Complaint: 75 y.o. female admitted 07/09/20 with atrial fibrillation to be initiated on dofetilide.   Assessment:  Patient Exclusion Criteria: If any screening criteria checked as "Yes", then  patient  should NOT receive dofetilide until criteria item is corrected. If "Yes" please indicate correction plan.  YES  NO Patient  Exclusion Criteria Correction Plan  []  [x]  Baseline QTc interval is greater than or equal to 440 msec. IF above YES box checked dofetilide contraindicated unless patient has ICD; then may proceed if QTc 500-550 msec or with known ventricular conduction abnormalities may proceed with QTc 550-600 msec. QTc = 0.37   []  [x]  Magnesium level is less than 1.8 mEq/l : Last magnesium:  Lab Results  Component Value Date   MG 1.9 06/19/2020         []  [x]  Potassium level is less than 4 mEq/l : Last potassium:  Lab Results  Component Value Date   K 4.6 07/05/2020         []  [x]  Patient is known or suspected to have a digoxin level greater than 2 ng/ml: No results found for: DIGOXIN    []  [x]  Creatinine clearance less than 20 ml/min (calculated using Cockcroft-Gault, actual body weight and serum creatinine): Estimated Creatinine Clearance: 29.6 mL/min (A) (by C-G formula based on SCr of 1.48 mg/dL (H)).  TBW CrCl 33.7 ml/min    []  [x]  Patient has received drugs known to prolong the QT intervals within the last 48 hours (phenothiazines, tricyclics or tetracyclic antidepressants, erythromycin, H-1 antihistamines, cisapride, fluoroquinolones, azithromycin). Drugs not listed above may have an, as yet, undetected potential to prolong the QT interval, updated information on QT prolonging agents is available at this website:QT prolonging agents   []  [x]  Patient received a dose of hydrochlorothiazide (Oretic) alone or in any combination including triamterene (Dyazide, Maxzide) in the last 48 hours.   []  [x]  Patient received  a medication known to increase dofetilide plasma concentrations prior to initial dofetilide dose:  . Trimethoprim (Primsol, Proloprim) in the last 36 hours . Verapamil (Calan, Verelan) in the last 36 hours or a sustained release dose in the last 72 hours . Megestrol (Megace) in the last 5 days  . Cimetidine (Tagamet) in the last 6 hours . Ketoconazole (Nizoral) in the last 24 hours . Itraconazole (Sporanox) in the last 48 hours  . Prochlorperazine (Compazine) in the last 36 hours    []  [x]  Patient is known to have a history of torsades de pointes; congenital or acquired long QT syndromes.   []  [x]  Patient has received a Class 1 antiarrhythmic with less than 2 half-lives since last dose. (Disopyramide, Quinidine, Procainamide, Lidocaine, Mexiletine, Flecainide, Propafenone)   []  [x]  Patient has received amiodarone therapy in the past 3 months or amiodarone level is greater than 0.3 ng/ml.    Patient has been appropriately anticoagulated with apixaban.  Ordering provider was confirmed at [email or web address] if they are not listed on the [place] Prescribers list.  Goal of Therapy: Follow renal function, electrolytes, potential drug interactions, and dose adjustment. Provide education and 1 week supply at discharge.  Plan:  []   Physician selected initial dose within range recommended for patients level of renal function - will monitor for response.  [x]   Physician selected initial dose outside of range recommended for patients level of renal function - will discuss if the dose should be altered at this time.   [facility] Calculated CrCl  Dose q12h  []  >  60 ml/min 500 mcg  [x]  40-60 ml/min 250 mcg  []  20-40 ml/min 125 mcg   2. Follow up QTc after the first 5 doses, renal function, electrolytes (K & Mg) daily x 3 days, dose adjustment, success of initiation and facilitate 1 week discharge supply as clinically indicated.  3. Initiate Tikosyn education video (Call [phone] and ask for  video # [phone]).  4. Place Enrollment Form on the chart for discharge supply of dofetilide.  Note: pt's current TBW CrCl is 33.7 ml/min; spoke with M. Tillery, PA, who is aware that Tikosyn dose selected for initiation (250 mcg PO Q 12 hrs) is above the dose for pt's current CrCl. However, pt had increase in Scr this admission and Scr is trending down; Dr. Lovena Le is aware and would like pt to initiate Tikosyn with 250 mcg PO Q 12 hrs dose; Cardiology will evaluate Scr trends and adjust Tikosyn dose as needed.  Gillermina Hu, PharmD, BCPS, [facility] Clinical Pharmacist

## 2020-06-20 NOTE — Progress Notes (Addendum)
Pharmacy: Dofetilide (Tikosyn) - Initial Consult Assessment and Electrolyte Replacement  Pharmacy consulted to assist in monitoring and replacing electrolytes in this 75 y.o. female admitted on 06/21/2020 undergoing dofetilide initiation. First dofetilide dose: 250 mcg PO Q 12 hrs.  Assessment:  Patient Exclusion Criteria: If any screening criteria checked as "Yes", then  patient  should NOT receive dofetilide until criteria item is corrected.  If "Yes" please indicate correction plan.  YES  NO Patient  Exclusion Criteria Correction Plan   []   [x]   Baseline QTc interval is greater than or equal to 440 msec. IF above YES box checked dofetilide contraindicated unless patient has ICD; then may proceed if QTc 500-550 msec or with known ventricular conduction abnormalities may proceed with QTc 550-600 msec. QTc = 0.37    []   [x]   Patient is known or suspected to have a digoxin level greater than 2 ng/ml: No results found for: DIGOXIN     []   [x]   Creatinine clearance less than 20 ml/min (calculated using Cockcroft-Gault, actual body weight and serum creatinine): Estimated Creatinine Clearance: 29.6 mL/min (A) (by C-G formula based on SCr of 1.48 mg/dL (H)). TBW CrCl 33.7 ml/min    []   [x]  Patient has received drugs known to prolong the QT intervals within the last 48 hours (phenothiazines, tricyclics or tetracyclic antidepressants, erythromycin, H-1 antihistamines, cisapride, fluoroquinolones, azithromycin, ondansetron).   Updated information on QT prolonging agents is available to be searched on the following database:QT prolonging agents     []   [x]   Patient received a dose of hydrochlorothiazide (Oretic) alone or in any combination including triamterene (Dyazide, Maxzide) in the last 48 hours.    []   [x]  Patient received a medication known to increase dofetilide plasma concentrations prior to initial dofetilide dose:  . Trimethoprim (Primsol, Proloprim) in the last 36  hours . Verapamil (Calan, Verelan) in the last 36 hours or a sustained release dose in the last 72 hours . Megestrol (Megace) in the last 5 days  . Cimetidine (Tagamet) in the last 6 hours . Ketoconazole (Nizoral) in the last 24 hours . Itraconazole (Sporanox) in the last 48 hours  . Prochlorperazine (Compazine) in the last 36 hours     []   [x]   Patient is known to have a history of torsades de pointes; congenital or acquired long QT syndromes.    []   [x]   Patient has received a Class 1 antiarrhythmic with less than 2 half-lives since last dose. (Disopyramide, Quinidine, Procainamide, Lidocaine, Mexiletine, Flecainide, Propafenone)    []   [x]   Patient has received amiodarone therapy in the past 3 months or amiodarone level is greater than 0.3 ng/ml.    Patient has been appropriately anticoagulated with apixaban.  Labs:    Component Value Date/Time   K 4.6 22-Jun-2020 0102   MG 1.9 06/19/2020 0846     Plan: Potassium: K >/= 4: Appropriate to initiate Tikosyn, no replacement needed    Magnesium: Mg 1.8-2: Give Mg 2 gm IV x1 to prevent Mg from dropping below 1.8 - do not need to recheck Mg. Appropriate to initiate Tikosyn  Thank you for allowing pharmacy to participate in this patient's care   Gillermina Hu, PharmD, BCPS, [facility] Clinical Pharmacist 06/22/20  5:23 PM

## 2020-06-21 ENCOUNTER — Inpatient Hospital Stay (HOSPITAL_COMMUNITY): Payer: Medicare Other

## 2020-06-21 ENCOUNTER — Encounter (HOSPITAL_COMMUNITY): Payer: Self-pay | Admitting: Cardiovascular Disease

## 2020-06-21 DIAGNOSIS — I4891 Unspecified atrial fibrillation: Secondary | ICD-10-CM | POA: Diagnosis not present

## 2020-06-21 LAB — LACTIC ACID, PLASMA
Lactic Acid, Venous: 1.9 mmol/L (ref 0.5–1.9)
Lactic Acid, Venous: 1.5 mmol/L (ref 0.5–1.9)

## 2020-06-21 LAB — BASIC METABOLIC PANEL
Anion gap: 10 (ref 5–15)
BUN: 11 mg/dL (ref 8–23)
CO2: 23 mmol/L (ref 22–32)
Calcium: 8.4 mg/dL — ABNORMAL LOW (ref 8.9–10.3)
Chloride: 101 mmol/L (ref 98–111)
Creatinine, Ser: 0.96 mg/dL (ref 0.44–1.00)
GFR, Estimated: >60 mL/min (ref >60)
Glucose, Bld: 90 mg/dL (ref 70–99)
Potassium: 4.5 mmol/L (ref 3.5–5.1)
Sodium: 134 mmol/L — ABNORMAL LOW (ref 135–145)

## 2020-06-21 LAB — MAGNESIUM: Magnesium: 2.1 mg/dL (ref 1.7–2.4)

## 2020-06-21 MED ORDER — ALUM & MAG HYDROXIDE-SIMETH 200-200-20 MG/5ML PO SUSP
30.0000 mL | Freq: Four times a day (QID) | ORAL | Status: DC | PRN
  Administered 2020-06-21: 30 mL via ORAL
  Filled 2020-06-21: qty 30

## 2020-06-21 NOTE — Plan of Care (Signed)

## 2020-06-21 NOTE — Plan of Care (Signed)
  Problem: Education: Goal: Knowledge of General Education information will improve Description: Including pain rating scale, medication(s)/side effects and non-pharmacologic comfort measures Outcome: Progressing   Problem: Health Behavior/Discharge Planning: Goal: Ability to manage health-related needs will improve Outcome: Progressing   Problem: Clinical Measurements: Goal: Ability to maintain clinical measurements within normal limits will improve Outcome: Progressing Goal: Will remain free from infection Outcome: Progressing Goal: Diagnostic test results will improve Outcome: Progressing Goal: Respiratory complications will improve Outcome: Progressing Goal: Cardiovascular complication will be avoided Outcome: Progressing   Problem: Activity: Goal: Risk for activity intolerance will decrease Outcome: Progressing   Problem: Nutrition: Goal: Adequate nutrition will be maintained Outcome: Progressing   Problem: Coping: Goal: Level of anxiety will decrease Outcome: Progressing   Problem: Elimination: Goal: Will not experience complications related to bowel motility Outcome: Progressing Goal: Will not experience complications related to urinary retention Outcome: Progressing   Problem: Pain Managment: Goal: General experience of comfort will improve Outcome: Progressing   Problem: Safety: Goal: Ability to remain free from injury will improve Outcome: Progressing   Problem: Skin Integrity: Goal: Risk for impaired skin integrity will decrease Outcome: Progressing   Problem: Education: Goal: Knowledge of disease or condition will improve Outcome: Progressing Goal: Understanding of medication regimen will improve Outcome: Progressing Goal: Individualized Educational Video(s) Outcome: Progressing   Problem: Activity: Goal: Ability to tolerate increased activity will improve Outcome: Progressing   Problem: Cardiac: Goal: Ability to achieve and maintain  adequate cardiopulmonary perfusion will improve Outcome: Progressing   

## 2020-06-21 NOTE — Progress Notes (Signed)
Electrophysiology Rounding Note  Patient Name: Crystal [name] Date of Encounter: 06/21/2020  Primary Cardiologist: [name], MD Electrophysiologist: New  Subjective   The patient is doing well today.  Received 252mcg dose of dofetilide yesterday. QTc prolonged slightly with the first dose but is still < 591ms. Will monitor closely.  Inpatient Medications    Scheduled Meds: . apixaban  5 mg Oral BID  . dofetilide  250 mcg Oral BID  . furosemide  40 mg Intravenous Daily  . gabapentin  600 mg Oral QHS  . levothyroxine  75 mcg Oral Q0600  . LORazepam  1 mg Oral QHS  . metoprolol tartrate  12.5 mg Oral BID  . montelukast  10 mg Oral QHS  . sodium chloride flush  3 mL Intravenous Q12H  . theophylline  200 mg Oral BID   Continuous Infusions: . sodium chloride    . diltiazem (CARDIZEM) infusion Stopped (06/22/2020 1211)  . lactated ringers     PRN Meds: sodium chloride, acetaminophen, ondansetron (ZOFRAN) IV, sodium chloride flush, traMADol   Vital Signs    Vitals:   06/22/2020 1955 06/21/20 0007 06/21/20 0400 06/21/20 0617  BP: 125/84 106/65 102/69   Pulse: 100 79 85   Resp: 19 14 16    Temp: 97.6 F (36.4 C) 97.7 F (36.5 C) 97.6 F (36.4 C)   TempSrc: Oral Oral Oral   SpO2: 97% 97% 93%   Weight:    65.9 kg  Height:        Intake/Output Summary (Last 24 hours) at 06/21/2020 0728 Last data filed at 06/21/2020 7867 Gross per 24 hour  Intake 1763.8 ml  Output 1000 ml  Net 763.8 ml   Filed Weights   06/17/2020 1109 07/04/2020 0600 06/21/20 0617  Weight: 54.4 kg 66.2 kg 65.9 kg    Physical Exam    GEN- The patient is elderly appearing, alert and oriented x 3 today.   Head- normocephalic, atraumatic Eyes-  Sclera clear, conjunctiva pink Ears- hearing intact Oropharynx- clear Neck- supple Lungs- Clear to ausculation bilaterally, normal work of breathing Heart- regular rate and rhythm, no murmurs, rubs or gallops GI- soft, NT, ND, + BS Extremities- no clubbing or  cyanosis. No edema Skin- no rash or lesion Psych- euthymic mood, full affect Neuro- strength and sensation are intact  Labs    CBC Recent Labs    06/26/2020 1115 06/19/20 0046 06/19/2020 0102  WBC 22.0* 20.5* 20.7*  NEUTROABS 16.0*  --   --   HGB 11.2* 10.3* 10.0*  HCT 40.3 35.8* 34.6*  MCV 86.5 84.2 83.2  PLT 544* 523* 672*   Basic Metabolic Panel Recent Labs    06/16/2020 0102 06/21/20 0104  NA 129* 134*  K 4.6 4.5  CL 98 101  CO2 21* 23  GLUCOSE 109* 90  BUN 16 11  CREATININE 1.48* 0.96  CALCIUM 8.3* 8.4*  MG 1.8 2.1   Liver Function Tests Recent Labs    07/08/2020 1121  AST 35  ALT 16  ALKPHOS 136*  BILITOT 1.4*  PROT 5.8*  ALBUMIN 3.4*   No results for input(s): LIPASE, AMYLASE in the last 72 hours. Cardiac Enzymes No results for input(s): CKTOTAL, CKMB, CKMBINDEX, TROPONINI in the last 72 hours.   Telemetry    Sinus arrhythmia  Radiology    ECHOCARDIOGRAM COMPLETE  Result Date: 06/19/2020    ECHOCARDIOGRAM REPORT   Patient Name:   Crystal [name] Date of Exam: 06/19/2020 Medical Rec #:  [id number]  Height:       63.0 in Accession #:    [id number]   Weight:       119.9 lb Date of Birth:  [birth year]     BSA:          1.556 m Patient Age:    75 years     BP:           121/51 mmHg Patient Gender: F            HR:           84 bpm. Exam Location:  Inpatient Procedure: 2D Echo, 3D Echo, Cardiac Doppler and Color Doppler Indications:    R94.31 Abnormal EKG  History:        Patient has prior history of Echocardiogram examinations, most                 recent 05/02/2018. Abnormal ECG, Arrythmias:Atrial Fibrillation;                 Risk Factors:Dyslipidemia and Hypertension. Elevated troponin.  Sonographer:    Roseanna Rainbow RDCS Referring Phys: [id number] [place]  1. There is significant change since the study on 05/02/2018. LVEF has decreased from 55-60% to 35-40% with diffuse hypokinesis. RV is newly severely dilated with severe systolic dysfunction, severe  tricuspid regurgitation and moderate pulmonary hypertension RVSP 48 mmHg. Large left pleural effusion.  2. Left ventricular ejection fraction, by estimation, is 35 to 40%. The left ventricle has moderately decreased function. The left ventricle has no regional wall motion abnormalities. There is mild concentric left ventricular hypertrophy. Left ventricular diastolic function could not be evaluated. There is the interventricular septum is flattened in systole and diastole, consistent with right ventricular pressure and volume overload.  3. Right ventricular systolic function is severely reduced. The right ventricular size is severely enlarged. There is moderately elevated pulmonary artery systolic pressure. The estimated right ventricular systolic pressure is 37.1 mmHg.  4. Right atrial size was moderately dilated.  5. Large pleural effusion in the left lateral region.  6. The mitral valve is normal in structure. Moderate mitral valve regurgitation. No evidence of mitral stenosis.  7. Tricuspid valve regurgitation is severe.  8. The aortic valve is normal in structure. Aortic valve regurgitation is not visualized. No aortic stenosis is present.  9. The inferior vena cava is dilated in size with <50% respiratory variability, suggesting right atrial pressure of 15 mmHg. FINDINGS  Left Ventricle: Left ventricular ejection fraction, by estimation, is 35 to 40%. The left ventricle has moderately decreased function. The left ventricle has no regional wall motion abnormalities. The left ventricular internal cavity size was normal in size. There is mild concentric left ventricular hypertrophy. The interventricular septum is flattened in systole and diastole, consistent with right ventricular pressure and volume overload. Left ventricular diastolic function could not be evaluated due to atrial fibrillation. Left ventricular diastolic function could not be evaluated. Right Ventricle: The right ventricular size is severely  enlarged. No increase in right ventricular wall thickness. Right ventricular systolic function is severely reduced. There is moderately elevated pulmonary artery systolic pressure. The tricuspid regurgitant velocity is 2.85 m/s, and with an assumed right atrial pressure of 15 mmHg, the estimated right ventricular systolic pressure is 69.6 mmHg. Left Atrium: Left atrial size was normal in size. Right Atrium: Right atrial size was moderately dilated. Pericardium: There is no evidence of pericardial effusion. Mitral Valve: The mitral valve is normal in structure. Moderate mitral valve regurgitation. No evidence  of mitral valve stenosis. Tricuspid Valve: The tricuspid valve is normal in structure. Tricuspid valve regurgitation is severe. No evidence of tricuspid stenosis. Aortic Valve: The aortic valve is normal in structure. Aortic valve regurgitation is not visualized. No aortic stenosis is present. Pulmonic Valve: The pulmonic valve was normal in structure. Pulmonic valve regurgitation is not visualized. No evidence of pulmonic stenosis. Aorta: The aortic root is normal in size and structure. Venous: The inferior vena cava is dilated in size with less than 50% respiratory variability, suggesting right atrial pressure of 15 mmHg. IAS/Shunts: No atrial level shunt detected by color flow Doppler. Additional Comments: There is a large pleural effusion in the left lateral region.  LEFT VENTRICLE PLAX 2D LVIDd:         3.60 cm LVIDs:         3.10 cm LV PW:         1.50 cm LV IVS:        1.10 cm LVOT diam:     1.70 cm LV SV:         39 LV SV Index:   25 LVOT Area:     2.27 cm  LV Volumes (MOD) LV vol d, MOD A2C: 40.1 ml LV vol d, MOD A4C: 37.4 ml LV vol s, MOD A2C: 25.7 ml LV vol s, MOD A4C: 23.0 ml LV SV MOD A2C:     14.4 ml LV SV MOD A4C:     37.4 ml LV SV MOD BP:      18.8 ml RIGHT VENTRICLE         IVC TAPSE (M-mode): 0.8 cm  IVC diam: 2.40 cm LEFT ATRIUM           Index       RIGHT ATRIUM           Index LA diam:       3.50 cm 2.25 cm/m  RA Area:     18.90 cm LA Vol (A2C): 20.3 ml 13.05 ml/m RA Volume:   58.30 ml  37.47 ml/m LA Vol (A4C): 35.0 ml 22.49 ml/m  AORTIC VALVE LVOT Vmax:   116.00 cm/s LVOT Vmean:  75.800 cm/s LVOT VTI:    0.172 m  AORTA Ao Root diam: 2.50 cm Ao Asc diam:  2.90 cm MITRAL VALVE               TRICUSPID VALVE MV Area (PHT): 5.27 cm    TR Peak grad:   32.5 mmHg MV Decel Time: 144 msec    TR Vmax:        285.00 cm/s MV E velocity: 55.27 cm/s                            SHUNTS                            Systemic VTI:  0.17 m                            Systemic Diam: 1.70 cm Ena Dawley MD Electronically signed by Ena Dawley MD Signature Date/Time: 06/19/2020/12:21:34 PM    Final    ECHO TEE  Result Date: 07/14/2020    TRANSESOPHOGEAL ECHO REPORT   Patient Name:   MAYLIA KARAPETYAN Date of Exam: 07/09/2020 Medical Rec #:  [id number]    Height:  63.0 in Accession #:    [id number]   Weight:       145.9 lb Date of Birth:  [birth year]     BSA:          1.691 m Patient Age:    61 years     BP:           104/66 mmHg Patient Gender: F            HR:           108 bpm. Exam Location:  Inpatient Procedure: Transesophageal Echo, Cardiac Doppler, Color Doppler and 3D Echo Indications:    Atrial flutter  History:        Patient has prior history of Echocardiogram examinations, most                 recent 06/19/2020. Arrythmias:Atrial Fibrillation and Atrial                 Flutter; Risk Factors:Hypertension and Former Smoker.  Sonographer:    Clayton Lefort RDCS (AE) Referring Phys: [place]: After discussion of the risks and benefits of a TEE, an informed consent was obtained from the patient. TEE procedure time was 32 minutes. The transesophogeal probe was passed without difficulty through the esophogus of the patient. Imaged were obtained with the patient in a left lateral decubitus position. Local oropharyngeal anesthetic was provided with Cetacaine. Sedation performed by different  physician. The patient was monitored while under deep sedation. Anesthestetic sedation was provided intravenously by Anesthesiology: 216mg  of Propofol, 60mg  of Lidocaine. Image quality was excellent. The patient's vital signs; including heart rate, blood pressure, and oxygen saturation; remained stable throughout the procedure. The patient developed no complications during the procedure. A successful direct current cardioversion was performed at 120 joules with 1 attempt. IMPRESSIONS  1. No LAA thrombus. Successful DCCV after TEE with restoration of NSR.  2. Left ventricular ejection fraction, by estimation, is 45 to 50%. Left ventricular ejection fraction by 3D volume is 47 %. The left ventricle has mildly decreased function. The left ventricle demonstrates global hypokinesis. There is the interventricular septum is flattened in diastole ('D' shaped left ventricle), consistent with right ventricular volume overload.  3. Severe TR is present. 2D PISA 0.9 cm. 2D ERO 0.50 cm2, R vol 37 cc. 3D VCA 0.54 cm2, R vol 40 cc. RF ~51% consistent with severe TR, whic accounts for low SV (SVI=22 cc/m2). TR is central and functional, likely related to atrial flutter/fibrillation.  The tricuspid valve is abnormal. Tricuspid valve regurgitation is severe.  4. Right ventricular systolic function is severely reduced. The right ventricular size is severely enlarged.  5. Left atrial size was mild to moderately dilated. No left atrial/left atrial appendage thrombus was detected. The LAA emptying velocity was 63 cm/s.  6. Right atrial size was severely dilated.  7. A small pericardial effusion is present. The pericardial effusion is surrounding the apex.  8. The mitral valve is grossly normal. Mild to moderate mitral valve regurgitation. No evidence of mitral stenosis.  9. The aortic valve is grossly normal. Aortic valve regurgitation is not visualized. No aortic stenosis is present. 10. There is mild (Grade II) layered plaque involving  the aortic root. 11. Evidence of atrial level shunting detected by color flow Doppler. Agitated saline contrast bubble study was positive with shunting observed within 3-6 cardiac cycles suggestive of interatrial shunt. FINDINGS  Left Ventricle: Left ventricular ejection fraction, by estimation, is 45 to 50%. Left  ventricular ejection fraction by 3D volume is 47 %. The left ventricle has mildly decreased function. The left ventricle demonstrates global hypokinesis. The left ventricular internal cavity size was normal in size. The interventricular septum is flattened in diastole ('D' shaped left ventricle), consistent with right ventricular volume overload. Right Ventricle: The right ventricular size is severely enlarged. No increase in right ventricular wall thickness. Right ventricular systolic function is severely reduced. Left Atrium: Left atrial size was mild to moderately dilated. No left atrial/left atrial appendage thrombus was detected. The LAA emptying velocity was 63 cm/s. Right Atrium: Right atrial size was severely dilated. Pericardium: A small pericardial effusion is present. The pericardial effusion is surrounding the apex. Mitral Valve: The mitral valve is grossly normal. Mild to moderate mitral valve regurgitation. No evidence of mitral valve stenosis. Tricuspid Valve: Severe TR is present. 2D PISA 0.9 cm. 2D ERO 0.50 cm2, R vol 37 cc. 3D VCA 0.54 cm2, R vol 40 cc. RF ~51% consistent with severe TR, whic accounts for low SV (SVI=22 cc/m2). TR is central and functional, likely related to atrial flutter/fibrillation. The tricuspid valve is abnormal. Tricuspid valve regurgitation is severe. No evidence of tricuspid stenosis. Aortic Valve: The aortic valve is grossly normal. Aortic valve regurgitation is not visualized. No aortic stenosis is present. Pulmonic Valve: The pulmonic valve was grossly normal. Pulmonic valve regurgitation is not visualized. No evidence of pulmonic stenosis. Aorta: The aortic  root is normal in size and structure. There is mild (Grade II) layered plaque involving the aortic root. Venous: The left lower pulmonary vein, left upper pulmonary vein, right upper pulmonary vein and right lower pulmonary vein are normal. IAS/Shunts: There is left bowing of the interatrial septum, suggestive of elevated right atrial pressure. Evidence of atrial level shunting detected by color flow Doppler. Agitated saline contrast was given intravenously to evaluate for intracardiac shunting. Agitated saline contrast bubble study was positive with shunting observed within 3-6 cardiac cycles suggestive of interatrial shunt. Additional Comments: There is a small pleural effusion in the left lateral region.  LEFT VENTRICLE PLAX 2D LVOT diam:     1.90 cm LV SV:         38 LV SV Index:   22              3D Volume EF LVOT Area:     2.84 cm        LV 3D EF:    Left                                             ventricular                                             ejection                                             fraction by                                             3D  volume                                             is 47 %.                                 3D Volume EF                                LV 3D EF:    47.10 %                                LV 3D EDV:   50000.00                                             mm                                LV 3D ESV:   26400.00                                             mm                                LV 3D SV:    23500.00                                             mm                                 3D Volume EF:                                3D EF:        47 % AORTIC VALVE LVOT Vmax:   91.27 cm/s LVOT Vmean:  58.187 cm/s LVOT VTI:    0.134 m TRICUSPID VALVE TR Peak grad:   19.5 mmHg TR Vmax:        221.00 cm/s  SHUNTS Systemic VTI:  0.13 m Systemic Diam: 1.90 cm Eleonore Chiquito MD Electronically signed by Eleonore Chiquito MD Signature Date/Time: 06/19/2020/2:49:28 PM     Final     Patient Profile     LAYLANI PLANTZ is a 75 y.o. 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 Sallyanne Kuster.  Assessment &  Plan    1. Paroxysmal AFib, atypical Aflutter     CHA2DS2Vasc is 8, resumed on a/c this admission, heparin gtt yesterday > Eliquis 06/19/20    s/p DCCV 07/12/2020 Dofetilide started 06/30/2020 PM, continue with ECG following each dose QTc lengthened with dofetilide but remains < 521ms.  Continue ongoing management with primary team   For questions or updates, please contact [place] Please consult [email or web address] for contact info under Cardiology/STEMI.  Signed, Vickie Epley, MD  06/21/2020, 7:28 AM

## 2020-06-21 NOTE — Progress Notes (Signed)
PROGRESS NOTE    Crystal [name]  ION:[id number] DOB: 07-30-1945 DOA: 06/23/2020 PCP: [name], MD   Brief Narrative:  Crystal [name] is a 75 y.o. female with medical history significant of afib, s/p cardioversion about 2 years ago, no longer on anticoagulation, IBS, IPF, hypertension, hyperlipidemia and peripheral vascular disease came to ED with generalized weakness and palpitations.  She talked to her PCP due to persistent symptoms who had advised her to come to the ED.  Patient denied any chest pain or shortness of breath.  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  Did not had A. fib since she had her cardioversion approximately 2 years ago.  On arrival to ED, she was found to be in A. fib/flutter with RVR, labs pertinent for leukocytosis of 22, sodium of 132, potassium of 3.2, creatinine of 1.95, troponin of 42>>53, lactic acid of 2.3, COVID-19 negative, chest x-ray with small bilateral pleural effusions, left greater than right, left base atelectasis/infiltrate.  She was admitted to hospital service.  Started on Cardizem drip as well as heparin drip.  Cardiology consulted.  Underwent DCCV cardioversion on July 14, 2020 and started on Tikosyn afterwards.  Assessment & Plan:   Active Problems:   Atrial fibrillation with rapid ventricular response (HCC)   Atrial fibrillation with controlled ventricular rate (HCC)   A. fib with RVR: Reportedly, patient had successful DCCV cardioversion yesterday and remained in sinus rhythm until this morning however when I saw him at around 10:15 AM this morning, she was in atrial fibrillation however she was not having any symptoms such as palpitation, chest pain or shortness of breath.  RN also walked in the room when I was there.  Her flipping back to A. fib was also new information for the RN and she was going to reach out to the cardiology.  Patient's rates were around 100.  Will defer to cardiology for this management.  She is on  Eliquis.  Acute on chronic hypoxic respiratory failure: Patient was recently discharged home with as needed oxygen of 2 L.  She was requiring 2 to 3 L but this morning she was on 5 L.  On exam, she has diminished breath sounds at the bases bilaterally.  Chest x-ray indicates atelectasis and clinically she also seems to have that.  She had incentive spirometry sitting at her desk but she was not aware what that exactly is.  I specifically requested RN to educate the patient and advised the patient to use this every hour.  Due to her being legally blind, and will be very challenging for her to see the numbers.  I requested RN to make sure that she is present when patient uses incentive spirometry.  Elevated troponin: Slightly elevated but flat troponin without having chest pain or shortness of breath does not indicate ACS and instead possible demand ischemia.  Echo with normal ejection fraction and no wall motion abnormality..  Chronic leukocytosis: Chest x-ray with concern of left base infiltrate, patient afebrile and no upper respiratory symptoms so doubt pneumonia.  Minimally elevated procalcitonin.  Watch off of antibiotics.  She has chronic leukocytosis that is a stable.  Lactic acidosis.  Most likely secondary to hypoxia with A. fib with  RVR.we will recheck again today.  AKI.  Creatinine at 1.95 with baseline below 1.  Now resolved.  Hypokalemia.  Resolved.  Pleural effusion.  Chest x-ray concerning for bilateral pleural effusion left more than right.  No prior diagnosis of heart failure  but can be due to CHF in the setting of A. fib with RVR.  Clinically appears volume overloaded.  BNP elevated.  Echo done but results pending.  Continue Lasix 40 mg IV daily.  Strict I's and O's.  History of IBS.  Patient do get flares, no acute concern.  Most recent flare was approximately 2 weeks ago. Continue to monitor  Acute hyponatremia: Has remained stable around 129.  DVT prophylaxis: Heparin  drip.   Code Status: Full Code  Family Communication:  None present at bedside.  Plan of care discussed with patient in length and he verbalized understanding and agreed with it.  Status is: Inpatient  Remains inpatient appropriate because:Ongoing diagnostic testing needed not appropriate for outpatient work up   Dispo: The patient is from: Home              Anticipated d/c is to: Home              Anticipated d/c date is: 1 day              Patient currently is not medically stable to d/c.             Estimated body mass index is 25.74 kg/m as calculated from the following:   Height as of this encounter: 5\' 3"  (1.6 m).   Weight as of this encounter: 65.9 kg.      Nutritional status:               Consultants:   Cardiology  Procedures:   None  Antimicrobials:  Anti-infectives (From admission, onward)   None         Subjective: Patient seen and examined.  She is calm.  Denied any chest pain, shortness of breath or palpitation.  Objective: Vitals:   06/21/20 0400 06/21/20 0617 06/21/20 0800 06/21/20 1041  BP: 102/69  (!) 97/57 112/67  Pulse: 85   100  Resp: 16  19   Temp: 97.6 F (36.4 C)  97.6 F (36.4 C)   TempSrc: Oral  Oral   SpO2: 93%     Weight:  65.9 kg    Height:        Intake/Output Summary (Last 24 hours) at 06/21/2020 1052 Last data filed at 06/21/2020 7124 Gross per 24 hour  Intake 1763.8 ml  Output 1000 ml  Net 763.8 ml   Filed Weights   07/06/2020 1109 06/17/2020 0600 06/21/20 0617  Weight: 54.4 kg 66.2 kg 65.9 kg    Examination:  General exam: Appears calm and comfortable, legally blind Respiratory system: Diminished breath sounds at the bases bilaterally, respiratory effort normal. Cardiovascular system: S1 & S2 heard, irregularly irregular rate and rhythm. No JVD, murmurs, rubs, gallops or clicks. No pedal edema. Gastrointestinal system: Abdomen is nondistended, soft and nontender. No organomegaly or masses felt.  Normal bowel sounds heard. Central nervous system: Alert and oriented. No focal neurological deficits. Extremities: Symmetric 5 x 5 power. Skin: No rashes, lesions or ulcers.  Psychiatry: Judgement and insight appear normal. Mood & affect appropriate.   Data Reviewed: I have personally reviewed following labs and imaging studies  CBC: Recent Labs  Lab 06/17/20 1607 06/22/2020 1115 06/19/20 0046 06/19/2020 0102  WBC 15.6* 22.0* 20.5* 20.7*  NEUTROABS  --  16.0*  --   --   HGB 11.1* 11.2* 10.3* 10.0*  HCT 37.2 40.3 35.8* 34.6*  MCV 84.5 86.5 84.2 83.2  PLT 687* 544* 523* 580*   Basic Metabolic Panel: Recent Labs  Lab 06/17/20 1607 07/07/2020 1121 06/19/20 0046 06/19/20 0846 06/19/20 1200 06/19/20 1848 07/05/2020 0102 06/21/20 0104  NA 131* 132* 125*  --  131* 130* 129* 134*  K 3.3* 3.2* 2.8*  --   --   --  4.6 4.5  CL 94* 94* 92*  --   --   --  98 101  CO2 21* 22 17*  --   --   --  21* 23  GLUCOSE 137* 85 70  --   --   --  109* 90  BUN 12 15 16   --   --   --  16 11  CREATININE 1.26* 1.95* 1.99*  --   --   --  1.48* 0.96  CALCIUM 8.5* 8.6* 7.6*  --   --   --  8.3* 8.4*  MG  --  1.7  --  1.9  --   --  1.8 2.1   GFR: Estimated Creatinine Clearance: 45.5 mL/min (by C-G formula based on SCr of 0.96 mg/dL). Liver Function Tests: Recent Labs  Lab 06/30/2020 1121  AST 35  ALT 16  ALKPHOS 136*  BILITOT 1.4*  PROT 5.8*  ALBUMIN 3.4*   No results for input(s): LIPASE, AMYLASE in the last 168 hours. No results for input(s): AMMONIA in the last 168 hours. Coagulation Profile: Recent Labs  Lab 06/30/2020 0102  INR 1.2   Cardiac Enzymes: No results for input(s): CKTOTAL, CKMB, CKMBINDEX, TROPONINI in the last 168 hours. BNP (last 3 results) No results for input(s): PROBNP in the last 8760 hours. HbA1C: No results for input(s): HGBA1C in the last 72 hours. CBG: No results for input(s): GLUCAP in the last 168 hours. Lipid Profile: No results for input(s): CHOL, HDL, LDLCALC,  TRIG, CHOLHDL, LDLDIRECT in the last 72 hours. Thyroid Function Tests: Recent Labs    06/19/2020 1818 07/03/2020 2148  TSH 3.778  --   FREET4  --  1.09   Anemia Panel: No results for input(s): VITAMINB12, FOLATE, FERRITIN, TIBC, IRON, RETICCTPCT in the last 72 hours. Sepsis Labs: Recent Labs  Lab 06/23/2020 1124 07/05/2020 1401 06/14/2020 1818 06/19/20 1257 06/19/20 1620  PROCALCITON 0.21  --   --   --   --   LATICACIDVEN  --  2.3* 2.0* 2.3* 3.0*    Recent Results (from the past 240 hour(s))  Culture, blood (routine x 2)     Status: None (Preliminary result)   Collection Time: 07/08/2020  2:01 PM   Specimen: BLOOD  Result Value Ref Range Status   Specimen Description BLOOD LEFT ANTECUBITAL  Final   Special Requests   Final    BOTTLES DRAWN AEROBIC AND ANAEROBIC Blood Culture adequate volume   Culture   Final    NO GROWTH 3 DAYS Performed at [facility], [place] [address] [place], [place] [address]    Report Status PENDING  Incomplete  Resp Panel by RT-PCR (Flu A&B, Covid) Nasopharyngeal Swab     Status: None   Collection Time: 06/17/2020  6:28 PM   Specimen: Nasopharyngeal Swab; Nasopharyngeal(NP) swabs in vial transport medium  Result Value Ref Range Status   SARS Coronavirus 2 by RT PCR NEGATIVE NEGATIVE Final    Comment: (NOTE) SARS-CoV-2 target nucleic acids are NOT DETECTED.  The SARS-CoV-2 RNA is generally detectable in upper respiratory specimens during the acute phase of infection. The lowest concentration of SARS-CoV-2 viral copies this assay can detect is 138 copies/mL. A negative result does not preclude SARS-Cov-2 infection and should  not be used as the sole basis for treatment or other patient management decisions. A negative result may occur with  improper specimen collection/handling, submission of specimen other than nasopharyngeal swab, presence of viral mutation(s) within the areas targeted by this assay, and inadequate number of viral copies(<138  copies/mL). A negative result must be combined with clinical observations, patient history, and epidemiological information. The expected result is Negative.  Fact Sheet for Patients:  EntrepreneurPulse.com.au  Fact Sheet for Healthcare Providers:  IncredibleEmployment.be  This test is no t yet approved or cleared by the [place] FDA and  has been authorized for detection and/or diagnosis of SARS-CoV-2 by FDA under an Emergency Use Authorization (EUA). This EUA will remain  in effect (meaning this test can be used) for the duration of the COVID-19 declaration under Section 564(b)(1) of the Act, 21 U.S.C.section 360bbb-3(b)(1), unless the authorization is terminated  or revoked sooner.       Influenza A by PCR NEGATIVE NEGATIVE Final   Influenza B by PCR NEGATIVE NEGATIVE Final    Comment: (NOTE) The Xpert Xpress SARS-CoV-2/FLU/RSV plus assay is intended as an aid in the diagnosis of influenza from Nasopharyngeal swab specimens and should not be used as a sole basis for treatment. Nasal washings and aspirates are unacceptable for Xpert Xpress SARS-CoV-2/FLU/RSV testing.  Fact Sheet for Patients: EntrepreneurPulse.com.au  Fact Sheet for Healthcare Providers: IncredibleEmployment.be  This test is not yet approved or cleared by the [place] FDA and has been authorized for detection and/or diagnosis of SARS-CoV-2 by FDA under an Emergency Use Authorization (EUA). This EUA will remain in effect (meaning this test can be used) for the duration of the COVID-19 declaration under Section 564(b)(1) of the Act, 21 U.S.C. section 360bbb-3(b)(1), unless the authorization is terminated or revoked.  Performed at [facility], [place] [address] [place], [place] [address]   Culture, blood (routine x 2)     Status: None (Preliminary result)   Collection Time: 07/01/2020  7:44 PM   Specimen: BLOOD  Result  Value Ref Range Status   Specimen Description BLOOD LEFT ANTECUBITAL  Final   Special Requests   Final    BOTTLES DRAWN AEROBIC AND ANAEROBIC Blood Culture adequate volume   Culture   Final    NO GROWTH 3 DAYS Performed at [facility], [place] [address] [place], [place] [address]    Report Status PENDING  Incomplete  MRSA PCR Screening     Status: None   Collection Time: 07/03/2020 10:00 PM   Specimen: Nasal Mucosa; Nasopharyngeal  Result Value Ref Range Status   MRSA by PCR NEGATIVE NEGATIVE Final    Comment:        The GeneXpert MRSA Assay (FDA approved for NASAL specimens only), is one component of a comprehensive MRSA colonization surveillance program. It is not intended to diagnose MRSA infection nor to guide or monitor treatment for MRSA infections. Performed at [facility], [place] [address] [place], [place] [address]       Radiology Studies: DG CHEST PORT 1 VIEW  Result Date: 06/21/2020 CLINICAL DATA:  75 year old female with congestive heart failure. EXAM: PORTABLE CHEST 1 VIEW COMPARISON:  06/19/2020 FINDINGS: Unchanged cardiomegaly. The cardiomediastinal silhouette is otherwise within normal limits. Unchanged small left pleural effusion. Slight interval increased bibasilar subsegmental atelectasis. No new focal consolidative opacities. Visualized osseous structures are within normal limits. IMPRESSION: Slight interval increased bibasilar subsegmental atelectasis. Unchanged small left pleural effusion. Unchanged cardiomegaly. Electronically Signed  By: Ruthann Cancer MD   On: 06/21/2020 08:35   ECHO TEE  Result Date: 07/10/2020    TRANSESOPHOGEAL ECHO REPORT   Patient Name:   ROSARIA THOMSEN Date of Exam: 06/16/2020 Medical Rec #:  [id number]    Height:       63.0 in Accession #:    [id number]   Weight:       145.9 lb Date of Birth:  [birth year]     BSA:          1.691 m Patient Age:    11 years     BP:           104/66 mmHg Patient Gender: F            HR:           108  bpm. Exam Location:  Inpatient Procedure: Transesophageal Echo, Cardiac Doppler, Color Doppler and 3D Echo Indications:    Atrial flutter  History:        Patient has prior history of Echocardiogram examinations, most                 recent 06/19/2020. Arrythmias:Atrial Fibrillation and Atrial                 Flutter; Risk Factors:Hypertension and Former Smoker.  Sonographer:    Clayton Lefort RDCS (AE) Referring Phys: [place]: After discussion of the risks and benefits of a TEE, an informed consent was obtained from the patient. TEE procedure time was 32 minutes. The transesophogeal probe was passed without difficulty through the esophogus of the patient. Imaged were obtained with the patient in a left lateral decubitus position. Local oropharyngeal anesthetic was provided with Cetacaine. Sedation performed by different physician. The patient was monitored while under deep sedation. Anesthestetic sedation was provided intravenously by Anesthesiology: 216mg  of Propofol, 60mg  of Lidocaine. Image quality was excellent. The patient's vital signs; including heart rate, blood pressure, and oxygen saturation; remained stable throughout the procedure. The patient developed no complications during the procedure. A successful direct current cardioversion was performed at 120 joules with 1 attempt. IMPRESSIONS  1. No LAA thrombus. Successful DCCV after TEE with restoration of NSR.  2. Left ventricular ejection fraction, by estimation, is 45 to 50%. Left ventricular ejection fraction by 3D volume is 47 %. The left ventricle has mildly decreased function. The left ventricle demonstrates global hypokinesis. There is the interventricular septum is flattened in diastole ('D' shaped left ventricle), consistent with right ventricular volume overload.  3. Severe TR is present. 2D PISA 0.9 cm. 2D ERO 0.50 cm2, R vol 37 cc. 3D VCA 0.54 cm2, R vol 40 cc. RF ~51% consistent with severe TR, whic accounts for low SV  (SVI=22 cc/m2). TR is central and functional, likely related to atrial flutter/fibrillation.  The tricuspid valve is abnormal. Tricuspid valve regurgitation is severe.  4. Right ventricular systolic function is severely reduced. The right ventricular size is severely enlarged.  5. Left atrial size was mild to moderately dilated. No left atrial/left atrial appendage thrombus was detected. The LAA emptying velocity was 63 cm/s.  6. Right atrial size was severely dilated.  7. A small pericardial effusion is present. The pericardial effusion is surrounding the apex.  8. The mitral valve is grossly normal. Mild to moderate mitral valve regurgitation. No evidence of mitral stenosis.  9. The aortic valve is grossly normal. Aortic valve regurgitation is not visualized. No aortic stenosis is present. 10. There is mild (  Grade II) layered plaque involving the aortic root. 11. Evidence of atrial level shunting detected by color flow Doppler. Agitated saline contrast bubble study was positive with shunting observed within 3-6 cardiac cycles suggestive of interatrial shunt. FINDINGS  Left Ventricle: Left ventricular ejection fraction, by estimation, is 45 to 50%. Left ventricular ejection fraction by 3D volume is 47 %. The left ventricle has mildly decreased function. The left ventricle demonstrates global hypokinesis. The left ventricular internal cavity size was normal in size. The interventricular septum is flattened in diastole ('D' shaped left ventricle), consistent with right ventricular volume overload. Right Ventricle: The right ventricular size is severely enlarged. No increase in right ventricular wall thickness. Right ventricular systolic function is severely reduced. Left Atrium: Left atrial size was mild to moderately dilated. No left atrial/left atrial appendage thrombus was detected. The LAA emptying velocity was 63 cm/s. Right Atrium: Right atrial size was severely dilated. Pericardium: A small pericardial  effusion is present. The pericardial effusion is surrounding the apex. Mitral Valve: The mitral valve is grossly normal. Mild to moderate mitral valve regurgitation. No evidence of mitral valve stenosis. Tricuspid Valve: Severe TR is present. 2D PISA 0.9 cm. 2D ERO 0.50 cm2, R vol 37 cc. 3D VCA 0.54 cm2, R vol 40 cc. RF ~51% consistent with severe TR, whic accounts for low SV (SVI=22 cc/m2). TR is central and functional, likely related to atrial flutter/fibrillation. The tricuspid valve is abnormal. Tricuspid valve regurgitation is severe. No evidence of tricuspid stenosis. Aortic Valve: The aortic valve is grossly normal. Aortic valve regurgitation is not visualized. No aortic stenosis is present. Pulmonic Valve: The pulmonic valve was grossly normal. Pulmonic valve regurgitation is not visualized. No evidence of pulmonic stenosis. Aorta: The aortic root is normal in size and structure. There is mild (Grade II) layered plaque involving the aortic root. Venous: The left lower pulmonary vein, left upper pulmonary vein, right upper pulmonary vein and right lower pulmonary vein are normal. IAS/Shunts: There is left bowing of the interatrial septum, suggestive of elevated right atrial pressure. Evidence of atrial level shunting detected by color flow Doppler. Agitated saline contrast was given intravenously to evaluate for intracardiac shunting. Agitated saline contrast bubble study was positive with shunting observed within 3-6 cardiac cycles suggestive of interatrial shunt. Additional Comments: There is a small pleural effusion in the left lateral region.  LEFT VENTRICLE PLAX 2D LVOT diam:     1.90 cm LV SV:         38 LV SV Index:   22              3D Volume EF LVOT Area:     2.84 cm        LV 3D EF:    Left                                             ventricular                                             ejection  fraction by                                             3D  volume                                             is 47 %.                                 3D Volume EF                                LV 3D EF:    47.10 %                                LV 3D EDV:   50000.00                                             mm                                LV 3D ESV:   26400.00                                             mm                                LV 3D SV:    23500.00                                             mm                                 3D Volume EF:                                3D EF:        47 % AORTIC VALVE LVOT Vmax:   91.27 cm/s LVOT Vmean:  58.187 cm/s LVOT VTI:    0.134 m TRICUSPID VALVE TR Peak grad:   19.5 mmHg TR Vmax:        221.00 cm/s  SHUNTS Systemic VTI:  0.13 m Systemic Diam: 1.90 cm Eleonore Chiquito MD Electronically signed by Eleonore Chiquito MD Signature Date/Time: 07/05/2020/2:49:28 PM    Final     Scheduled Meds: . apixaban  5 mg Oral BID  . dofetilide  250 mcg Oral BID  . furosemide  40 mg Intravenous Daily  . gabapentin  600 mg Oral QHS  .  levothyroxine  75 mcg Oral Q0600  . LORazepam  1 mg Oral QHS  . metoprolol tartrate  12.5 mg Oral BID  . montelukast  10 mg Oral QHS  . sodium chloride flush  3 mL Intravenous Q12H  . theophylline  200 mg Oral BID   Continuous Infusions: . sodium chloride    . diltiazem (CARDIZEM) infusion Stopped (07/14/2020 1211)  . lactated ringers       LOS: 2 days   Time spent: 29 minutes   Darliss Cheney, MD [facility]  06/21/2020, 10:52 AM   To contact the attending provider between 7A-7P or the covering provider during after hours 7P-7A, please log into the web site [email or web address].

## 2020-06-21 NOTE — Progress Notes (Signed)
Pharmacy: Dofetilide (Tikosyn) - Follow Up Assessment and Electrolyte Replacement  Pharmacy consulted to assist in monitoring and replacing electrolytes in this 75 y.o. female admitted on 06/21/2020 undergoing dofetilide initiation. First dofetilide dose: 250 mcg q12 hrs started 07/13/2020.  Labs:    Component Value Date/Time   K 4.5 06/21/2020 0104   MG 2.1 06/21/2020 0104     Plan: Potassium: K >/= 4: No additional supplementation needed  Magnesium: Mg > 2: No additional supplementation needed  Richardine Service, PharmD, BCPS PGY2 Cardiology Pharmacy Resident Phone: [phone] 06/21/2020  2:35 PM  Please check [email or web address] for unit-specific pharmacy phone numbers.

## 2020-06-22 DIAGNOSIS — I4891 Unspecified atrial fibrillation: Secondary | ICD-10-CM | POA: Diagnosis not present

## 2020-06-22 DIAGNOSIS — I5021 Acute systolic (congestive) heart failure: Secondary | ICD-10-CM | POA: Diagnosis not present

## 2020-06-22 LAB — CBC WITH DIFFERENTIAL/PLATELET
Abs Immature Granulocytes: 0.35 10*3/uL — ABNORMAL HIGH (ref 0.00–0.07)
Basophils Absolute: 0.2 10*3/uL — ABNORMAL HIGH (ref 0.0–0.1)
Basophils Relative: 1 %
Eosinophils Absolute: 0.2 10*3/uL (ref 0.0–0.5)
Eosinophils Relative: 1 %
HCT: 32.6 % — ABNORMAL LOW (ref 36.0–46.0)
Hemoglobin: 9.8 g/dL — ABNORMAL LOW (ref 12.0–15.0)
Immature Granulocytes: 2 %
Lymphocytes Relative: 10 %
Lymphs Abs: 1.5 10*3/uL (ref 0.7–4.0)
MCH: 24.8 pg — ABNORMAL LOW (ref 26.0–34.0)
MCHC: 30.1 g/dL (ref 30.0–36.0)
MCV: 82.5 fL (ref 80.0–100.0)
Monocytes Absolute: 2 10*3/uL — ABNORMAL HIGH (ref 0.1–1.0)
Monocytes Relative: 14 %
Neutro Abs: 10.4 10*3/uL — ABNORMAL HIGH (ref 1.7–7.7)
Neutrophils Relative %: 72 %
Platelets: 490 10*3/uL — ABNORMAL HIGH (ref 150–400)
RBC: 3.95 MIL/uL (ref 3.87–5.11)
RDW: 21.2 % — ABNORMAL HIGH (ref 11.5–15.5)
WBC: 14.6 10*3/uL — ABNORMAL HIGH (ref 4.0–10.5)
nRBC: 0 % (ref 0.0–0.2)

## 2020-06-22 LAB — BASIC METABOLIC PANEL
Anion gap: 11 (ref 5–15)
BUN: 7 mg/dL — ABNORMAL LOW (ref 8–23)
CO2: 25 mmol/L (ref 22–32)
Calcium: 8.6 mg/dL — ABNORMAL LOW (ref 8.9–10.3)
Chloride: 98 mmol/L (ref 98–111)
Creatinine, Ser: 0.76 mg/dL (ref 0.44–1.00)
GFR, Estimated: >60 mL/min (ref >60)
Glucose, Bld: 87 mg/dL (ref 70–99)
Potassium: 3.7 mmol/L (ref 3.5–5.1)
Sodium: 134 mmol/L — ABNORMAL LOW (ref 135–145)

## 2020-06-22 LAB — MAGNESIUM: Magnesium: 1.5 mg/dL — ABNORMAL LOW (ref 1.7–2.4)

## 2020-06-22 MED ORDER — LOPERAMIDE HCL 2 MG PO CAPS
2.0000 mg | ORAL_CAPSULE | ORAL | Status: DC | PRN
  Administered 2020-06-22 – 2020-06-23 (×3): 2 mg via ORAL
  Filled 2020-06-22 (×4): qty 1

## 2020-06-22 MED ORDER — METOPROLOL TARTRATE 12.5 MG HALF TABLET
12.5000 mg | ORAL_TABLET | Freq: Once | ORAL | Status: AC
  Administered 2020-06-22: 12.5 mg via ORAL
  Filled 2020-06-22: qty 1

## 2020-06-22 MED ORDER — POTASSIUM CHLORIDE CRYS ER 20 MEQ PO TBCR
60.0000 meq | EXTENDED_RELEASE_TABLET | Freq: Once | ORAL | Status: AC
  Administered 2020-06-22: 60 meq via ORAL
  Filled 2020-06-22: qty 3

## 2020-06-22 MED ORDER — MAGNESIUM SULFATE 2 GM/50ML IV SOLN
2.0000 g | Freq: Once | INTRAVENOUS | Status: AC
  Administered 2020-06-22: 2 g via INTRAVENOUS
  Filled 2020-06-22: qty 50

## 2020-06-22 MED ORDER — METOPROLOL TARTRATE 25 MG PO TABS
25.0000 mg | ORAL_TABLET | Freq: Two times a day (BID) | ORAL | Status: DC
  Administered 2020-06-22: 25 mg via ORAL
  Filled 2020-06-22 (×2): qty 1

## 2020-06-22 NOTE — Progress Notes (Signed)
Pharmacy: Dofetilide (Tikosyn) - Follow Up Assessment and Electrolyte Replacement  Pharmacy consulted to assist in monitoring and replacing electrolytes in this 75 y.o. female admitted on 06/27/2020 undergoing dofetilide initiation. First dofetilide dose: 250 mcg q12 hrs started 06/14/2020.  Labs:    Component Value Date/Time   K 3.7 06/22/2020 0120   MG 1.5 (L) 06/22/2020 0120     Plan: Potassium: K 3.5-3.7:  Give KCl 60 mEq po x1   Magnesium: Mg 1.3-1.7: Give Mg 4 gm IV x1  Richardine Service, PharmD, BCPS PGY2 Cardiology Pharmacy Resident Phone: [phone] 06/22/2020  7:02 AM  Please check [email or web address] for unit-specific pharmacy phone numbers.

## 2020-06-22 NOTE — Progress Notes (Signed)
PROGRESS NOTE    Crystal [name]  [id number] DOB: Jul 28, 1945 DOA: 06/17/2020 PCP: [name], MD   Brief Narrative:  Crystal [name] is a 74 y.o. female with medical history significant of afib, s/p cardioversion about 2 years ago, no longer on anticoagulation, IBS, IPF, hypertension, hyperlipidemia and peripheral vascular disease came to ED with generalized weakness and palpitations.  She talked to her PCP due to persistent symptoms who had advised her to come to the ED.  Patient denied any chest pain or shortness of breath.  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  Did not had A. fib since she had her cardioversion approximately 2 years ago.  On arrival to ED, she was found to be in A. fib/flutter with RVR, labs pertinent for leukocytosis of 22, sodium of 132, potassium of 3.2, creatinine of 1.95, troponin of 42>>53, lactic acid of 2.3, COVID-19 negative, chest x-ray with small bilateral pleural effusions, left greater than right, left base atelectasis/infiltrate.  She was admitted to hospital service.  Started on Cardizem drip as well as heparin drip.  Cardiology consulted.  Underwent DCCV cardioversion on 07/13/2020 and started on Tikosyn afterwards.  Assessment & Plan:   Active Problems:   Atrial fibrillation with rapid ventricular response (HCC)   Atrial fibrillation with controlled ventricular rate (HCC)   A. fib with RVR: Reportedly, patient had successful DCCV cardioversion on 07/06/2020 and remained in sinus rhythm until the morning of 06/21/2020. She was started on Tikosyn. She has been in and out of normal sinus rhythm. Cardiology managing. Appreciate their help. Continue Eliquis.   Acute on chronic hypoxic respiratory failure: Patient was recently discharged home with as needed oxygen of 2 L. Currently down to 3 L requirement. Chest x-ray shows atelectasis. I once again encouraged incentive spirometry.  Elevated troponin: Slightly elevated but flat  troponin without having chest pain or shortness of breath does not indicate ACS and instead possible demand ischemia.  Echo with normal ejection fraction and no wall motion abnormality..  Chronic leukocytosis: Chest x-ray with concern of left base infiltrate, patient afebrile and no upper respiratory symptoms so doubt pneumonia.  Minimally elevated procalcitonin.  Watch off of antibiotics.  She has chronic leukocytosis that is a stable.  Lactic acidosis.  Most likely secondary to hypoxia with A. fib with  RVR.resolved.  AKI. Resolved.  Hypokalemia.  Resolved.  Pleural effusion.  Chest x-ray concerning for bilateral pleural effusion left more than right.  No prior diagnosis of heart failure but can be due to CHF in the setting of A. fib with RVR.  Clinically appears volume overloaded.  BNP elevated.  Echo done but results pending.  Continue Lasix 40 mg IV daily.  Strict I's and O's.  History of IBS and chronic intermittent diarrhea.  Patient do get flares, no acute concern.  Most recent flare was approximately 2 weeks ago. She is having intermittent diarrhea. Requesting Imodium. We will do that.  Acute hyponatremia: Improved and close to being normal.  DVT prophylaxis: Eliquis   Code Status: Full Code  Family Communication:  husband present at bedside.  Plan of care discussed with patient in length and he verbalized understanding and agreed with it.  Status is: Inpatient  Remains inpatient appropriate because:Ongoing diagnostic testing needed not appropriate for outpatient work up   Dispo: The patient is from: Home              Anticipated d/c is to: Home  Anticipated d/c date is: 1 day              Patient currently is not medically stable to d/c.             Estimated body mass index is 24.17 kg/m as calculated from the following:   Height as of this encounter: 5\' 3"  (1.6 m).   Weight as of this encounter: 61.9 kg.      Nutritional status:                Consultants:   Cardiology  Procedures:   None  Antimicrobials:  Anti-infectives (From admission, onward)   None         Subjective: Seen and examined. Husband at the bedside. She denied having any chest pain, palpitation or shortness of breath.  Objective: Vitals:   06/21/20 1540 06/21/20 2000 06/21/20 2100 06/22/20 0306  BP: 125/67 133/79 125/67 118/79  Pulse: 98 90 100 (!) 108  Resp: 18 17 16 19   Temp: 97.9 F (36.6 C) 97.8 F (36.6 C) 98 F (36.7 C) 97.6 F (36.4 C)  TempSrc: Oral Oral Oral Oral  SpO2: 99% 99% 100%   Weight:    61.9 kg  Height:        Intake/Output Summary (Last 24 hours) at 06/22/2020 1020 Last data filed at 06/21/2020 1749 Gross per 24 hour  Intake --  Output 1600 ml  Net -1600 ml   Filed Weights   06-23-20 0600 06/21/20 0617 06/22/20 0306  Weight: 66.2 kg 65.9 kg 61.9 kg    Examination:  General exam: Appears calm and comfortable, legally blind bilaterally Respiratory system: Diminished breath sounds at the bases bilaterally. Respiratory effort normal. Cardiovascular system: S1 & S2 heard, irregularly irregular rate and rhythm. No JVD, murmurs, rubs, gallops or clicks. No pedal edema. Gastrointestinal system: Abdomen is nondistended, soft and nontender. No organomegaly or masses felt. Normal bowel sounds heard. Central nervous system: Alert and oriented. No focal neurological deficits. Extremities: Symmetric 5 x 5 power. Skin: No rashes, lesions or ulcers.  Psychiatry: Judgement and insight appear normal. Mood & affect appropriate.   Data Reviewed: I have personally reviewed following labs and imaging studies  CBC: Recent Labs  Lab 06/17/20 1607 07/08/2020 1115 06/19/20 0046 2020-06-23 0102 06/22/20 0120  WBC 15.6* 22.0* 20.5* 20.7* 14.6*  NEUTROABS  --  16.0*  --   --  10.4*  HGB 11.1* 11.2* 10.3* 10.0* 9.8*  HCT 37.2 40.3 35.8* 34.6* 32.6*  MCV 84.5 86.5 84.2 83.2 82.5  PLT 687* 544* 523* 544* 490*   Basic  Metabolic Panel: Recent Labs  Lab 07/07/2020 1121 06/19/20 0046 06/19/20 0846 06/19/20 1200 06/19/20 1848 2020/06/23 0102 06/21/20 0104 06/22/20 0120  NA 132* 125*  --  131* 130* 129* 134* 134*  K 3.2* 2.8*  --   --   --  4.6 4.5 3.7  CL 94* 92*  --   --   --  98 101 98  CO2 22 17*  --   --   --  21* 23 25  GLUCOSE 85 70  --   --   --  109* 90 87  BUN 15 16  --   --   --  16 11 7*  CREATININE 1.95* 1.99*  --   --   --  1.48* 0.96 0.76  CALCIUM 8.6* 7.6*  --   --   --  8.3* 8.4* 8.6*  MG 1.7  --  1.9  --   --  1.8 2.1 1.5*   GFR: Estimated Creatinine Clearance: 49.5 mL/min (by C-G formula based on SCr of 0.76 mg/dL). Liver Function Tests: Recent Labs  Lab 06/19/2020 1121  AST 35  ALT 16  ALKPHOS 136*  BILITOT 1.4*  PROT 5.8*  ALBUMIN 3.4*   No results for input(s): LIPASE, AMYLASE in the last 168 hours. No results for input(s): AMMONIA in the last 168 hours. Coagulation Profile: Recent Labs  Lab 2020/07/06 0102  INR 1.2   Cardiac Enzymes: No results for input(s): CKTOTAL, CKMB, CKMBINDEX, TROPONINI in the last 168 hours. BNP (last 3 results) No results for input(s): PROBNP in the last 8760 hours. HbA1C: No results for input(s): HGBA1C in the last 72 hours. CBG: No results for input(s): GLUCAP in the last 168 hours. Lipid Profile: No results for input(s): CHOL, HDL, LDLCALC, TRIG, CHOLHDL, LDLDIRECT in the last 72 hours. Thyroid Function Tests: No results for input(s): TSH, T4TOTAL, FREET4, T3FREE, THYROIDAB in the last 72 hours. Anemia Panel: No results for input(s): VITAMINB12, FOLATE, FERRITIN, TIBC, IRON, RETICCTPCT in the last 72 hours. Sepsis Labs: Recent Labs  Lab 07/03/2020 1124 06/17/2020 1401 06/19/20 1257 06/19/20 1620 06/21/20 1119 06/21/20 1431  PROCALCITON 0.21  --   --   --   --   --   LATICACIDVEN  --    < > 2.3* 3.0* 1.9 1.5   < > = values in this interval not displayed.    Recent Results (from the past 240 hour(s))  Culture, blood (routine x  2)     Status: None (Preliminary result)   Collection Time: 07/11/2020  2:01 PM   Specimen: BLOOD  Result Value Ref Range Status   Specimen Description BLOOD LEFT ANTECUBITAL  Final   Special Requests   Final    BOTTLES DRAWN AEROBIC AND ANAEROBIC Blood Culture adequate volume   Culture   Final    NO GROWTH 3 DAYS Performed at [facility], [address] [place], [place] [address]    Report Status PENDING  Incomplete  Resp Panel by RT-PCR (Flu A&B, Covid) Nasopharyngeal Swab     Status: None   Collection Time: 06/17/2020  6:28 PM   Specimen: Nasopharyngeal Swab; Nasopharyngeal(NP) swabs in vial transport medium  Result Value Ref Range Status   SARS Coronavirus 2 by RT PCR NEGATIVE NEGATIVE Final    Comment: (NOTE) SARS-CoV-2 target nucleic acids are NOT DETECTED.  The SARS-CoV-2 RNA is generally detectable in upper respiratory specimens during the acute phase of infection. The lowest concentration of SARS-CoV-2 viral copies this assay can detect is 138 copies/mL. A negative result does not preclude SARS-Cov-2 infection and should not be used as the sole basis for treatment or other patient management decisions. A negative result may occur with  improper specimen collection/handling, submission of specimen other than nasopharyngeal swab, presence of viral mutation(s) within the areas targeted by this assay, and inadequate number of viral copies(<138 copies/mL). A negative result must be combined with clinical observations, patient history, and epidemiological information. The expected result is Negative.  Fact Sheet for Patients:  EntrepreneurPulse.com.au  Fact Sheet for Healthcare Providers:  IncredibleEmployment.be  This test is no t yet approved or cleared by the [place] FDA and  has been authorized for detection and/or diagnosis of SARS-CoV-2 by FDA under an Emergency Use Authorization (EUA). This EUA will remain  in effect  (meaning this test can be used) for the duration of the COVID-19 declaration under Section 564(b)(1) of the Act, 21  U.S.C.section 360bbb-3(b)(1), unless the authorization is terminated  or revoked sooner.       Influenza A by PCR NEGATIVE NEGATIVE Final   Influenza B by PCR NEGATIVE NEGATIVE Final    Comment: (NOTE) The Xpert Xpress SARS-CoV-2/FLU/RSV plus assay is intended as an aid in the diagnosis of influenza from Nasopharyngeal swab specimens and should not be used as a sole basis for treatment. Nasal washings and aspirates are unacceptable for Xpert Xpress SARS-CoV-2/FLU/RSV testing.  Fact Sheet for Patients: EntrepreneurPulse.com.au  Fact Sheet for Healthcare Providers: IncredibleEmployment.be  This test is not yet approved or cleared by the [place] FDA and has been authorized for detection and/or diagnosis of SARS-CoV-2 by FDA under an Emergency Use Authorization (EUA). This EUA will remain in effect (meaning this test can be used) for the duration of the COVID-19 declaration under Section 564(b)(1) of the Act, 21 U.S.C. section 360bbb-3(b)(1), unless the authorization is terminated or revoked.  Performed at [facility], [place] [address] [place], [place] [address]   Culture, blood (routine x 2)     Status: None (Preliminary result)   Collection Time: 06/26/2020  7:44 PM   Specimen: BLOOD  Result Value Ref Range Status   Specimen Description BLOOD LEFT ANTECUBITAL  Final   Special Requests   Final    BOTTLES DRAWN AEROBIC AND ANAEROBIC Blood Culture adequate volume   Culture   Final    NO GROWTH 3 DAYS Performed at [facility], [place] [address] [place], [place] [address]    Report Status PENDING  Incomplete  MRSA PCR Screening     Status: None   Collection Time: 06/21/2020 10:00 PM   Specimen: Nasal Mucosa; Nasopharyngeal  Result Value Ref Range Status   MRSA by PCR NEGATIVE NEGATIVE Final    Comment:         The GeneXpert MRSA Assay (FDA approved for NASAL specimens only), is one component of a comprehensive MRSA colonization surveillance program. It is not intended to diagnose MRSA infection nor to guide or monitor treatment for MRSA infections. Performed at [facility], [place] [address] [place], [place] [address]       Radiology Studies: DG CHEST PORT 1 VIEW  Result Date: 06/21/2020 CLINICAL DATA:  75 year old female with congestive heart failure. EXAM: PORTABLE CHEST 1 VIEW COMPARISON:  06/23/2020 FINDINGS: Unchanged cardiomegaly. The cardiomediastinal silhouette is otherwise within normal limits. Unchanged small left pleural effusion. Slight interval increased bibasilar subsegmental atelectasis. No new focal consolidative opacities. Visualized osseous structures are within normal limits. IMPRESSION: Slight interval increased bibasilar subsegmental atelectasis. Unchanged small left pleural effusion. Unchanged cardiomegaly. Electronically Signed   By: Ruthann Cancer MD   On: 06/21/2020 08:35   ECHO TEE  Result Date: 06/21/2020    TRANSESOPHOGEAL ECHO REPORT   Patient Name:   Crystal [name] Date of Exam: 06/20/2020 Medical Rec #:  [id number]    Height:       63.0 in Accession #:    [id number]   Weight:       145.9 lb Date of Birth:  1945-10-16     BSA:          1.691 m Patient Age:    44 years     BP:           104/66 mmHg Patient Gender: F            HR:           108 bpm. Exam Location:  Inpatient Procedure: Transesophageal Echo, Cardiac Doppler, Color Doppler and 3D Echo Indications:    Atrial flutter  History:        Patient has prior history of Echocardiogram examinations, most                 recent 06/19/2020. Arrythmias:Atrial Fibrillation and Atrial                 Flutter; Risk Factors:Hypertension and Former Smoker.  Sonographer:    Clayton Lefort RDCS (AE) Referring Phys: [place]: After discussion of the risks and benefits of a TEE, an informed consent was obtained  from the patient. TEE procedure time was 32 minutes. The transesophogeal probe was passed without difficulty through the esophogus of the patient. Imaged were obtained with the patient in a left lateral decubitus position. Local oropharyngeal anesthetic was provided with Cetacaine. Sedation performed by different physician. The patient was monitored while under deep sedation. Anesthestetic sedation was provided intravenously by Anesthesiology: 216mg  of Propofol, 60mg  of Lidocaine. Image quality was excellent. The patient's vital signs; including heart rate, blood pressure, and oxygen saturation; remained stable throughout the procedure. The patient developed no complications during the procedure. A successful direct current cardioversion was performed at 120 joules with 1 attempt. IMPRESSIONS  1. No LAA thrombus. Successful DCCV after TEE with restoration of NSR.  2. Left ventricular ejection fraction, by estimation, is 45 to 50%. Left ventricular ejection fraction by 3D volume is 47 %. The left ventricle has mildly decreased function. The left ventricle demonstrates global hypokinesis. There is the interventricular septum is flattened in diastole ('D' shaped left ventricle), consistent with right ventricular volume overload.  3. Severe TR is present. 2D PISA 0.9 cm. 2D ERO 0.50 cm2, R vol 37 cc. 3D VCA 0.54 cm2, R vol 40 cc. RF ~51% consistent with severe TR, whic accounts for low SV (SVI=22 cc/m2). TR is central and functional, likely related to atrial flutter/fibrillation.  The tricuspid valve is abnormal. Tricuspid valve regurgitation is severe.  4. Right ventricular systolic function is severely reduced. The right ventricular size is severely enlarged.  5. Left atrial size was mild to moderately dilated. No left atrial/left atrial appendage thrombus was detected. The LAA emptying velocity was 63 cm/s.  6. Right atrial size was severely dilated.  7. A small pericardial effusion is present. The pericardial  effusion is surrounding the apex.  8. The mitral valve is grossly normal. Mild to moderate mitral valve regurgitation. No evidence of mitral stenosis.  9. The aortic valve is grossly normal. Aortic valve regurgitation is not visualized. No aortic stenosis is present. 10. There is mild (Grade II) layered plaque involving the aortic root. 11. Evidence of atrial level shunting detected by color flow Doppler. Agitated saline contrast bubble study was positive with shunting observed within 3-6 cardiac cycles suggestive of interatrial shunt. FINDINGS  Left Ventricle: Left ventricular ejection fraction, by estimation, is 45 to 50%. Left ventricular ejection fraction by 3D volume is 47 %. The left ventricle has mildly decreased function. The left ventricle demonstrates global hypokinesis. The left ventricular internal cavity size was normal in size. The interventricular septum is flattened in diastole ('D' shaped left ventricle), consistent with right ventricular volume overload. Right Ventricle: The right ventricular size is severely enlarged. No increase in right ventricular wall thickness. Right ventricular systolic function is severely reduced. Left Atrium: Left atrial size was mild to moderately dilated. No left atrial/left atrial appendage thrombus was detected. The LAA emptying velocity  was 63 cm/s. Right Atrium: Right atrial size was severely dilated. Pericardium: A small pericardial effusion is present. The pericardial effusion is surrounding the apex. Mitral Valve: The mitral valve is grossly normal. Mild to moderate mitral valve regurgitation. No evidence of mitral valve stenosis. Tricuspid Valve: Severe TR is present. 2D PISA 0.9 cm. 2D ERO 0.50 cm2, R vol 37 cc. 3D VCA 0.54 cm2, R vol 40 cc. RF ~51% consistent with severe TR, whic accounts for low SV (SVI=22 cc/m2). TR is central and functional, likely related to atrial flutter/fibrillation. The tricuspid valve is abnormal. Tricuspid valve regurgitation is  severe. No evidence of tricuspid stenosis. Aortic Valve: The aortic valve is grossly normal. Aortic valve regurgitation is not visualized. No aortic stenosis is present. Pulmonic Valve: The pulmonic valve was grossly normal. Pulmonic valve regurgitation is not visualized. No evidence of pulmonic stenosis. Aorta: The aortic root is normal in size and structure. There is mild (Grade II) layered plaque involving the aortic root. Venous: The left lower pulmonary vein, left upper pulmonary vein, right upper pulmonary vein and right lower pulmonary vein are normal. IAS/Shunts: There is left bowing of the interatrial septum, suggestive of elevated right atrial pressure. Evidence of atrial level shunting detected by color flow Doppler. Agitated saline contrast was given intravenously to evaluate for intracardiac shunting. Agitated saline contrast bubble study was positive with shunting observed within 3-6 cardiac cycles suggestive of interatrial shunt. Additional Comments: There is a small pleural effusion in the left lateral region.  LEFT VENTRICLE PLAX 2D LVOT diam:     1.90 cm LV SV:         38 LV SV Index:   22              3D Volume EF LVOT Area:     2.84 cm        LV 3D EF:    Left                                             ventricular                                             ejection                                             fraction by                                             3D volume                                             is 47 %.                                 3D Volume EF  LV 3D EF:    47.10 %                                LV 3D EDV:   50000.00                                             mm                                LV 3D ESV:   26400.00                                             mm                                LV 3D SV:    23500.00                                             mm                                 3D Volume EF:                                 3D EF:        47 % AORTIC VALVE LVOT Vmax:   91.27 cm/s LVOT Vmean:  58.187 cm/s LVOT VTI:    0.134 m TRICUSPID VALVE TR Peak grad:   19.5 mmHg TR Vmax:        221.00 cm/s  SHUNTS Systemic VTI:  0.13 m Systemic Diam: 1.90 cm Eleonore Chiquito MD Electronically signed by Eleonore Chiquito MD Signature Date/Time: 06/19/2020/2:49:28 PM    Final     Scheduled Meds: . apixaban  5 mg Oral BID  . dofetilide  250 mcg Oral BID  . furosemide  40 mg Intravenous Daily  . gabapentin  600 mg Oral QHS  . levothyroxine  75 mcg Oral Q0600  . LORazepam  1 mg Oral QHS  . metoprolol tartrate  12.5 mg Oral BID  . montelukast  10 mg Oral QHS  . sodium chloride flush  3 mL Intravenous Q12H  . theophylline  200 mg Oral BID   Continuous Infusions: . sodium chloride    . diltiazem (CARDIZEM) infusion Stopped (07/09/2020 1211)  . lactated ringers    . magnesium sulfate bolus IVPB       LOS: 3 days   Time spent: 28 minutes   Darliss Cheney, MD [facility]  06/22/2020, 10:20 AM   To contact the attending provider between 7A-7P or the covering provider during after hours 7P-7A, please log into the web site [email or web address].

## 2020-06-22 NOTE — Plan of Care (Signed)

## 2020-06-22 NOTE — Plan of Care (Signed)
  Problem: Education: Goal: Knowledge of General Education information will improve Description: Including pain rating scale, medication(s)/side effects and non-pharmacologic comfort measures Outcome: Progressing   Problem: Health Behavior/Discharge Planning: Goal: Ability to manage health-related needs will improve Outcome: Progressing   Problem: Clinical Measurements: Goal: Ability to maintain clinical measurements within normal limits will improve Outcome: Progressing Goal: Will remain free from infection Outcome: Progressing Goal: Diagnostic test results will improve Outcome: Progressing Goal: Respiratory complications will improve Outcome: Progressing Goal: Cardiovascular complication will be avoided Outcome: Progressing   Problem: Activity: Goal: Risk for activity intolerance will decrease Outcome: Progressing   Problem: Nutrition: Goal: Adequate nutrition will be maintained Outcome: Progressing   Problem: Coping: Goal: Level of anxiety will decrease Outcome: Progressing   Problem: Elimination: Goal: Will not experience complications related to bowel motility Outcome: Progressing Goal: Will not experience complications related to urinary retention Outcome: Progressing   Problem: Pain Managment: Goal: General experience of comfort will improve Outcome: Progressing   Problem: Safety: Goal: Ability to remain free from injury will improve Outcome: Progressing   Problem: Skin Integrity: Goal: Risk for impaired skin integrity will decrease Outcome: Progressing   Problem: Education: Goal: Knowledge of disease or condition will improve Outcome: Progressing Goal: Understanding of medication regimen will improve Outcome: Progressing Goal: Individualized Educational Video(s) Outcome: Progressing   Problem: Activity: Goal: Ability to tolerate increased activity will improve Outcome: Progressing   Problem: Cardiac: Goal: Ability to achieve and maintain  adequate cardiopulmonary perfusion will improve Outcome: Progressing   

## 2020-06-22 NOTE — Progress Notes (Signed)
Progress Note  Patient Name: Crystal [name] Date of Encounter: 06/22/2020  Primary Cardiologist: [name], MD   Subjective   No chest pain or sob. Denies palpitations.  Inpatient Medications    Scheduled Meds: . apixaban  5 mg Oral BID  . dofetilide  250 mcg Oral BID  . furosemide  40 mg Intravenous Daily  . gabapentin  600 mg Oral QHS  . levothyroxine  75 mcg Oral Q0600  . LORazepam  1 mg Oral QHS  . metoprolol tartrate  12.5 mg Oral BID  . montelukast  10 mg Oral QHS  . potassium chloride  60 mEq Oral Once  . sodium chloride flush  3 mL Intravenous Q12H  . theophylline  200 mg Oral BID   Continuous Infusions: . sodium chloride    . diltiazem (CARDIZEM) infusion Stopped (06/14/2020 1211)  . lactated ringers    . magnesium sulfate bolus IVPB     PRN Meds: sodium chloride, acetaminophen, alum & mag hydroxide-simeth, ondansetron (ZOFRAN) IV, sodium chloride flush, traMADol   Vital Signs    Vitals:   06/21/20 1540 06/21/20 2000 06/21/20 2100 06/22/20 0306  BP: 125/67 133/79 125/67 118/79  Pulse: 98 90 100 (!) 108  Resp: 18 17 16 19   Temp: 97.9 F (36.6 C) 97.8 F (36.6 C) 98 F (36.7 C) 97.6 F (36.4 C)  TempSrc: Oral Oral Oral Oral  SpO2: 99% 99% 100%   Weight:    61.9 kg  Height:        Intake/Output Summary (Last 24 hours) at 06/22/2020 0905 Last data filed at 06/21/2020 1749 Gross per 24 hour  Intake 3 ml  Output 1600 ml  Net -1597 ml   Filed Weights   06/26/2020 0600 06/21/20 0617 06/22/20 0306  Weight: 66.2 kg 65.9 kg 61.9 kg    Telemetry    Off tele, previously NSR and atrial fib - Personally Reviewed  ECG     NSR with bursts of PAC,s - Personally Reviewed  Physical Exam   GEN: No acute distress.   Neck: No JVD Cardiac: IRRR, no murmurs, rubs, or gallops.  Respiratory: Clear to auscultation bilaterally. GI: Soft, nontender, non-distended  MS: No edema; No deformity. Neuro:  Nonfocal  Psych: Normal affect   Labs     Chemistry Recent Labs  Lab 06/14/2020 1121 06/19/20 0046 07/10/2020 0102 06/21/20 0104 06/22/20 0120  NA 132*   < > 129* 134* 134*  K 3.2*   < > 4.6 4.5 3.7  CL 94*   < > 98 101 98  CO2 22   < > 21* 23 25  GLUCOSE 85   < > 109* 90 87  BUN 15   < > 16 11 7*  CREATININE 1.95*   < > 1.48* 0.96 0.76  CALCIUM 8.6*   < > 8.3* 8.4* 8.6*  PROT 5.8*  --   --   --   --   ALBUMIN 3.4*  --   --   --   --   AST 35  --   --   --   --   ALT 16  --   --   --   --   ALKPHOS 136*  --   --   --   --   BILITOT 1.4*  --   --   --   --   GFRNONAA 26*   < > 36* >60 >60  ANIONGAP 16*   < > 10 10 11    < > =  values in this interval not displayed.     Hematology Recent Labs  Lab 06/19/20 0046 06/19/2020 0102 06/22/20 0120  WBC 20.5* 20.7* 14.6*  RBC 4.25 4.16 3.95  HGB 10.3* 10.0* 9.8*  HCT 35.8* 34.6* 32.6*  MCV 84.2 83.2 82.5  MCH 24.2* 24.0* 24.8*  MCHC 28.8* 28.9* 30.1  RDW 20.7* 20.5* 21.2*  PLT 523* 544* 490*    Cardiac EnzymesNo results for input(s): TROPONINI in the last 168 hours. No results for input(s): TROPIPOC in the last 168 hours.   BNP Recent Labs  Lab 07/03/2020 1124  BNP 1,162.3*     DDimer No results for input(s): DDIMER in the last 168 hours.   Radiology    DG CHEST PORT 1 VIEW  Result Date: 06/21/2020 CLINICAL DATA:  75 year old female with congestive heart failure. EXAM: PORTABLE CHEST 1 VIEW COMPARISON:  06/23/2020 FINDINGS: Unchanged cardiomegaly. The cardiomediastinal silhouette is otherwise within normal limits. Unchanged small left pleural effusion. Slight interval increased bibasilar subsegmental atelectasis. No new focal consolidative opacities. Visualized osseous structures are within normal limits. IMPRESSION: Slight interval increased bibasilar subsegmental atelectasis. Unchanged small left pleural effusion. Unchanged cardiomegaly. Electronically Signed   By: Ruthann Cancer MD   On: 06/21/2020 08:35   ECHO TEE  Result Date: 07/04/2020    TRANSESOPHOGEAL ECHO  REPORT   Patient Name:   Crystal [name] Date of Exam: 07/06/2020 Medical Rec #:  [id number]    Height:       63.0 in Accession #:    [id number]   Weight:       145.9 lb Date of Birth:  [birth year]     BSA:          1.691 m Patient Age:    13 years     BP:           104/66 mmHg Patient Gender: F            HR:           108 bpm. Exam Location:  Inpatient Procedure: Transesophageal Echo, Cardiac Doppler, Color Doppler and 3D Echo Indications:    Atrial flutter  History:        Patient has prior history of Echocardiogram examinations, most                 recent 06/19/2020. Arrythmias:Atrial Fibrillation and Atrial                 Flutter; Risk Factors:Hypertension and Former Smoker.  Sonographer:    Clayton Lefort RDCS (AE) Referring Phys: [place]: After discussion of the risks and benefits of a TEE, an informed consent was obtained from the patient. TEE procedure time was 32 minutes. The transesophogeal probe was passed without difficulty through the esophogus of the patient. Imaged were obtained with the patient in a left lateral decubitus position. Local oropharyngeal anesthetic was provided with Cetacaine. Sedation performed by different physician. The patient was monitored while under deep sedation. Anesthestetic sedation was provided intravenously by Anesthesiology: 216mg  of Propofol, 60mg  of Lidocaine. Image quality was excellent. The patient's vital signs; including heart rate, blood pressure, and oxygen saturation; remained stable throughout the procedure. The patient developed no complications during the procedure. A successful direct current cardioversion was performed at 120 joules with 1 attempt. IMPRESSIONS  1. No LAA thrombus. Successful DCCV after TEE with restoration of NSR.  2. Left ventricular ejection fraction, by estimation, is 45 to 50%. Left ventricular ejection fraction by 3D volume is  47 %. The left ventricle has mildly decreased function. The left ventricle demonstrates global  hypokinesis. There is the interventricular septum is flattened in diastole ('D' shaped left ventricle), consistent with right ventricular volume overload.  3. Severe TR is present. 2D PISA 0.9 cm. 2D ERO 0.50 cm2, R vol 37 cc. 3D VCA 0.54 cm2, R vol 40 cc. RF ~51% consistent with severe TR, whic accounts for low SV (SVI=22 cc/m2). TR is central and functional, likely related to atrial flutter/fibrillation.  The tricuspid valve is abnormal. Tricuspid valve regurgitation is severe.  4. Right ventricular systolic function is severely reduced. The right ventricular size is severely enlarged.  5. Left atrial size was mild to moderately dilated. No left atrial/left atrial appendage thrombus was detected. The LAA emptying velocity was 63 cm/s.  6. Right atrial size was severely dilated.  7. A small pericardial effusion is present. The pericardial effusion is surrounding the apex.  8. The mitral valve is grossly normal. Mild to moderate mitral valve regurgitation. No evidence of mitral stenosis.  9. The aortic valve is grossly normal. Aortic valve regurgitation is not visualized. No aortic stenosis is present. 10. There is mild (Grade II) layered plaque involving the aortic root. 11. Evidence of atrial level shunting detected by color flow Doppler. Agitated saline contrast bubble study was positive with shunting observed within 3-6 cardiac cycles suggestive of interatrial shunt. FINDINGS  Left Ventricle: Left ventricular ejection fraction, by estimation, is 45 to 50%. Left ventricular ejection fraction by 3D volume is 47 %. The left ventricle has mildly decreased function. The left ventricle demonstrates global hypokinesis. The left ventricular internal cavity size was normal in size. The interventricular septum is flattened in diastole ('D' shaped left ventricle), consistent with right ventricular volume overload. Right Ventricle: The right ventricular size is severely enlarged. No increase in right ventricular wall  thickness. Right ventricular systolic function is severely reduced. Left Atrium: Left atrial size was mild to moderately dilated. No left atrial/left atrial appendage thrombus was detected. The LAA emptying velocity was 63 cm/s. Right Atrium: Right atrial size was severely dilated. Pericardium: A small pericardial effusion is present. The pericardial effusion is surrounding the apex. Mitral Valve: The mitral valve is grossly normal. Mild to moderate mitral valve regurgitation. No evidence of mitral valve stenosis. Tricuspid Valve: Severe TR is present. 2D PISA 0.9 cm. 2D ERO 0.50 cm2, R vol 37 cc. 3D VCA 0.54 cm2, R vol 40 cc. RF ~51% consistent with severe TR, whic accounts for low SV (SVI=22 cc/m2). TR is central and functional, likely related to atrial flutter/fibrillation. The tricuspid valve is abnormal. Tricuspid valve regurgitation is severe. No evidence of tricuspid stenosis. Aortic Valve: The aortic valve is grossly normal. Aortic valve regurgitation is not visualized. No aortic stenosis is present. Pulmonic Valve: The pulmonic valve was grossly normal. Pulmonic valve regurgitation is not visualized. No evidence of pulmonic stenosis. Aorta: The aortic root is normal in size and structure. There is mild (Grade II) layered plaque involving the aortic root. Venous: The left lower pulmonary vein, left upper pulmonary vein, right upper pulmonary vein and right lower pulmonary vein are normal. IAS/Shunts: There is left bowing of the interatrial septum, suggestive of elevated right atrial pressure. Evidence of atrial level shunting detected by color flow Doppler. Agitated saline contrast was given intravenously to evaluate for intracardiac shunting. Agitated saline contrast bubble study was positive with shunting observed within 3-6 cardiac cycles suggestive of interatrial shunt. Additional Comments: There is a small pleural effusion in  the left lateral region.  LEFT VENTRICLE PLAX 2D LVOT diam:     1.90 cm LV SV:          38 LV SV Index:   22              3D Volume EF LVOT Area:     2.84 cm        LV 3D EF:    Left                                             ventricular                                             ejection                                             fraction by                                             3D volume                                             is 47 %.                                 3D Volume EF                                LV 3D EF:    47.10 %                                LV 3D EDV:   50000.00                                             mm                                LV 3D ESV:   26400.00                                             mm                                LV 3D SV:    23500.00  mm                                 3D Volume EF:                                3D EF:        47 % AORTIC VALVE LVOT Vmax:   91.27 cm/s LVOT Vmean:  58.187 cm/s LVOT VTI:    0.134 m TRICUSPID VALVE TR Peak grad:   19.5 mmHg TR Vmax:        221.00 cm/s  SHUNTS Systemic VTI:  0.13 m Systemic Diam: 1.90 cm Eleonore Chiquito MD Electronically signed by Eleonore Chiquito MD Signature Date/Time: 06/16/2020/2:49:28 PM    Final     Cardiac Studies   TEE/DCCV noted  Patient Profile     75 y.o. female admitted with atrial fib and RVR, HTN, CM/sob  Assessment & Plan    1. Atrial fib/flutter - she is in and out of NSR. Continue dofetilide 250 q12. Her QT is acceptable. 2. Hypokalemia -resolved. 3. Acute systolic heart failure - likely tachy induced. We will continue current meds. She appears close to euvolemic. Add losartan at dc. 4. COPD - she is not wheezing on exam today. We will follow.      For questions or updates, please contact [place] Please consult [email or web address] for contact info under Cardiology/STEMI.      Signed, Cristopher Peru, MD  06/22/2020, 9:05 AM  Patient ID: [name], female   DOB: 1946/03/07, 75 y.o.   MRN: [id number]

## 2020-06-23 DIAGNOSIS — I5021 Acute systolic (congestive) heart failure: Secondary | ICD-10-CM | POA: Diagnosis not present

## 2020-06-23 DIAGNOSIS — I4891 Unspecified atrial fibrillation: Secondary | ICD-10-CM | POA: Diagnosis not present

## 2020-06-23 LAB — BASIC METABOLIC PANEL
Anion gap: 11 (ref 5–15)
BUN: 7 mg/dL — ABNORMAL LOW (ref 8–23)
CO2: 24 mmol/L (ref 22–32)
Calcium: 8.8 mg/dL — ABNORMAL LOW (ref 8.9–10.3)
Chloride: 99 mmol/L (ref 98–111)
Creatinine, Ser: 0.71 mg/dL (ref 0.44–1.00)
GFR, Estimated: >60 mL/min (ref >60)
Glucose, Bld: 90 mg/dL (ref 70–99)
Potassium: 4.4 mmol/L (ref 3.5–5.1)
Sodium: 134 mmol/L — ABNORMAL LOW (ref 135–145)

## 2020-06-23 LAB — CULTURE, BLOOD (ROUTINE X 2)
Culture: NO GROWTH
Culture: NO GROWTH
Special Requests: ADEQUATE
Special Requests: ADEQUATE

## 2020-06-23 LAB — MAGNESIUM: Magnesium: 1.9 mg/dL (ref 1.7–2.4)

## 2020-06-23 MED ORDER — FUROSEMIDE 20 MG PO TABS
20.0000 mg | ORAL_TABLET | Freq: Every day | ORAL | Status: DC
  Administered 2020-06-23 – 2020-06-24 (×2): 20 mg via ORAL
  Filled 2020-06-23 (×2): qty 1

## 2020-06-23 MED ORDER — MAGNESIUM SULFATE IN D5W 1-5 GM/100ML-% IV SOLN
1.0000 g | Freq: Once | INTRAVENOUS | Status: AC
  Administered 2020-06-23: 1 g via INTRAVENOUS
  Filled 2020-06-23: qty 100

## 2020-06-23 MED ORDER — METOPROLOL SUCCINATE ER 25 MG PO TB24
25.0000 mg | ORAL_TABLET | Freq: Every day | ORAL | Status: DC
  Administered 2020-06-23: 25 mg via ORAL
  Filled 2020-06-23: qty 1

## 2020-06-23 MED ORDER — OXYCODONE HCL 5 MG PO TABS
5.0000 mg | ORAL_TABLET | Freq: Three times a day (TID) | ORAL | Status: DC | PRN
  Administered 2020-06-23 – 2020-06-25 (×5): 5 mg via ORAL
  Filled 2020-06-23 (×5): qty 1

## 2020-06-23 MED ORDER — EMPAGLIFLOZIN 10 MG PO TABS
10.0000 mg | ORAL_TABLET | Freq: Every day | ORAL | Status: DC
  Administered 2020-06-23 – 2020-06-25 (×3): 10 mg via ORAL
  Filled 2020-06-23 (×3): qty 1

## 2020-06-23 NOTE — Progress Notes (Signed)
PROGRESS NOTE    Crystal [name]  [id number] DOB: 11/27/1945 DOA: 06/15/2020 PCP: [name], MD   Brief Narrative:  Crystal [name] is a 75 y.o. female with medical history significant of afib, s/p cardioversion about 2 years ago, no longer on anticoagulation, IBS, IPF, hypertension, hyperlipidemia and peripheral vascular disease came to ED with generalized weakness and palpitations.  She talked to her PCP due to persistent symptoms who had advised her to come to the ED.  Patient denied any chest pain or shortness of breath.  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  Did not had A. fib since she had her cardioversion approximately 2 years ago.  On arrival to ED, she was found to be in A. fib/flutter with RVR, labs pertinent for leukocytosis of 22, sodium of 132, potassium of 3.2, creatinine of 1.95, troponin of 42>>53, lactic acid of 2.3, COVID-19 negative, chest x-ray with small bilateral pleural effusions, left greater than right, left base atelectasis/infiltrate.  She was admitted to hospital service.  Started on Cardizem drip as well as heparin drip.  Cardiology consulted.  Underwent DCCV cardioversion on 07/12/2020 and started on Tikosyn afterwards.  Assessment & Plan:   Active Problems:   Atrial fibrillation with rapid ventricular response (HCC)   Atrial fibrillation with controlled ventricular rate (HCC)   A. fib with RVR: Reportedly, patient had successful DCCV cardioversion on 07/08/2020 and remained in sinus rhythm until the morning of 06/21/2020. She was started on Tikosyn. She has been in and out of normal sinus rhythm.  She was in sinus rhythm this morning when seen by cardiology but back in atrial fibrillation when I saw her with rates around 100.  Remains asymptomatic.  Cardiology managing. Appreciate their help. Continue Eliquis and beta-blocker.  Acute on chronic hypoxic respiratory failure: Patient was recently discharged home with as needed oxygen of 2  L. Currently down to 2 L requirement. Chest x-ray shows atelectasis. I once again encouraged incentive spirometry.  Acute systolic congestive heart failure: Appears euvolemic.  She was on IV Lasix which has been discontinued.  Per cardiology/Dr. Jacalyn Lefevre note, she will be transition to oral Lasix but no order placed.  I have sent a message to Dr. Stanford Breed to review that.  Elevated troponin: Slightly elevated but flat troponin without having chest pain or shortness of breath does not indicate ACS and instead possible demand ischemia.  Echo with normal ejection fraction and no wall motion abnormality..  Chronic leukocytosis: Chest x-ray with concern of left base infiltrate, patient afebrile and no upper respiratory symptoms so doubt pneumonia.  Minimally elevated procalcitonin.  Watch off of antibiotics.  She has chronic leukocytosis that is a stable.  Lactic acidosis.  Most likely secondary to hypoxia with A. fib with  RVR.resolved.  AKI. Resolved.  Hypokalemia.  Resolved.  Pleural effusion.  Secondary to acute congestive heart failure.  Management as above.  History of IBS and chronic intermittent diarrhea.  Patient do get flares, no acute concern.  Most recent flare was approximately 2 weeks ago. She is having intermittent diarrhea. Requesting Imodium. We will do that.  Acute hyponatremia: Improved and close to being normal.  DVT prophylaxis: Eliquis   Code Status: Full Code  Family Communication:  husband present at bedside.  Plan of care discussed with patient in length and he verbalized understanding and agreed with it.  Status is: Inpatient  Remains inpatient appropriate because:Ongoing diagnostic testing needed not appropriate for outpatient work up   Dispo:  The patient is from: Home              Anticipated d/c is to: Home              Anticipated d/c date is: 1-2 day              Patient currently is not medically stable to d/c.             Estimated body  mass index is 23.39 kg/m as calculated from the following:   Height as of this encounter: 5\' 3"  (1.6 m).   Weight as of this encounter: 59.9 kg.      Nutritional status:               Consultants:   Cardiology  Procedures:   None  Antimicrobials:  Anti-infectives (From admission, onward)   None         Subjective: Seen and examined.  Husband at the bedside.  She remains asymptomatic.  Objective: Vitals:   06/22/20 1935 06/22/20 2320 06/23/20 0520 06/23/20 0952  BP: 105/65 (!) 93/52 112/82 100/64  Pulse: 84 83 79 77  Resp: 15 15 15    Temp: 98.2 F (36.8 C) 97.6 F (36.4 C) 97.8 F (36.6 C)   TempSrc: Oral Oral Oral   SpO2: 96% 99% 95%   Weight:   59.9 kg   Height:        Intake/Output Summary (Last 24 hours) at 06/23/2020 1048 Last data filed at 06/23/2020 0825 Gross per 24 hour  Intake --  Output 1852 ml  Net -1852 ml   Filed Weights   06/21/20 0617 06/22/20 0306 06/23/20 0520  Weight: 65.9 kg 61.9 kg 59.9 kg    Examination:  General exam: Appears calm and comfortable  Respiratory system: Diminished breath sounds at the bases bilaterally. Respiratory effort normal. Cardiovascular system: S1 & S2 heard, irregularly irregular rate and rhythm. No JVD, murmurs, rubs, gallops or clicks. No pedal edema. Gastrointestinal system: Abdomen is nondistended, soft and nontender. No organomegaly or masses felt. Normal bowel sounds heard. Central nervous system: Alert and oriented. No focal neurological deficits. Extremities: Symmetric 5 x 5 power. Skin: No rashes, lesions or ulcers.  Psychiatry: Judgement and insight appear normal. Mood & affect appropriate.    Data Reviewed: I have personally reviewed following labs and imaging studies  CBC: Recent Labs  Lab 06/17/20 1607 07/05/2020 1115 06/19/20 0046 07/12/2020 0102 06/22/20 0120  WBC 15.6* 22.0* 20.5* 20.7* 14.6*  NEUTROABS  --  16.0*  --   --  10.4*  HGB 11.1* 11.2* 10.3* 10.0* 9.8*  HCT  37.2 40.3 35.8* 34.6* 32.6*  MCV 84.5 86.5 84.2 83.2 82.5  PLT 687* 544* 523* 544* 123XX123*   Basic Metabolic Panel: Recent Labs  Lab 06/19/20 0046 06/19/20 0846 06/19/20 1200 06/19/20 1848 06/24/2020 0102 06/21/20 0104 06/22/20 0120 06/23/20 0052  NA 125*  --    < > 130* 129* 134* 134* 134*  K 2.8*  --   --   --  4.6 4.5 3.7 4.4  CL 92*  --   --   --  98 101 98 99  CO2 17*  --   --   --  21* 23 25 24   GLUCOSE 70  --   --   --  109* 90 87 90  BUN 16  --   --   --  16 11 7* 7*  CREATININE 1.99*  --   --   --  1.48*  0.96 0.76 0.71  CALCIUM 7.6*  --   --   --  8.3* 8.4* 8.6* 8.8*  MG  --  1.9  --   --  1.8 2.1 1.5* 1.9   < > = values in this interval not displayed.   GFR: Estimated Creatinine Clearance: 49.5 mL/min (by C-G formula based on SCr of 0.71 mg/dL). Liver Function Tests: Recent Labs  Lab Jul 02, 2020 1121  AST 35  ALT 16  ALKPHOS 136*  BILITOT 1.4*  PROT 5.8*  ALBUMIN 3.4*   No results for input(s): LIPASE, AMYLASE in the last 168 hours. No results for input(s): AMMONIA in the last 168 hours. Coagulation Profile: Recent Labs  Lab 06/30/2020 0102  INR 1.2   Cardiac Enzymes: No results for input(s): CKTOTAL, CKMB, CKMBINDEX, TROPONINI in the last 168 hours. BNP (last 3 results) No results for input(s): PROBNP in the last 8760 hours. HbA1C: No results for input(s): HGBA1C in the last 72 hours. CBG: No results for input(s): GLUCAP in the last 168 hours. Lipid Profile: No results for input(s): CHOL, HDL, LDLCALC, TRIG, CHOLHDL, LDLDIRECT in the last 72 hours. Thyroid Function Tests: No results for input(s): TSH, T4TOTAL, FREET4, T3FREE, THYROIDAB in the last 72 hours. Anemia Panel: No results for input(s): VITAMINB12, FOLATE, FERRITIN, TIBC, IRON, RETICCTPCT in the last 72 hours. Sepsis Labs: Recent Labs  Lab July 02, 2020 1124 2020-07-02 1401 06/19/20 1257 06/19/20 1620 06/21/20 1119 06/21/20 1431  PROCALCITON 0.21  --   --   --   --   --   LATICACIDVEN  --    <  > 2.3* 3.0* 1.9 1.5   < > = values in this interval not displayed.    Recent Results (from the past 240 hour(s))  Culture, blood (routine x 2)     Status: None   Collection Time: 07-02-2020  2:01 PM   Specimen: BLOOD  Result Value Ref Range Status   Specimen Description BLOOD LEFT ANTECUBITAL  Final   Special Requests   Final    BOTTLES DRAWN AEROBIC AND ANAEROBIC Blood Culture adequate volume   Culture   Final    NO GROWTH 5 DAYS Performed at [facility], [address], [place], [place] [address]    Report Status 06/23/2020 FINAL  Final  Resp Panel by RT-PCR (Flu A&B, Covid) Nasopharyngeal Swab     Status: None   Collection Time: July 02, 2020  6:28 PM   Specimen: Nasopharyngeal Swab; Nasopharyngeal(NP) swabs in vial transport medium  Result Value Ref Range Status   SARS Coronavirus 2 by RT PCR NEGATIVE NEGATIVE Final    Comment: (NOTE) SARS-CoV-2 target nucleic acids are NOT DETECTED.  The SARS-CoV-2 RNA is generally detectable in upper respiratory specimens during the acute phase of infection. The lowest concentration of SARS-CoV-2 viral copies this assay can detect is 138 copies/mL. A negative result does not preclude SARS-Cov-2 infection and should not be used as the sole basis for treatment or other patient management decisions. A negative result may occur with  improper specimen collection/handling, submission of specimen other than nasopharyngeal swab, presence of viral mutation(s) within the areas targeted by this assay, and inadequate number of viral copies(<138 copies/mL). A negative result must be combined with clinical observations, patient history, and epidemiological information. The expected result is Negative.  Fact Sheet for Patients:  EntrepreneurPulse.com.au  Fact Sheet for Healthcare Providers:  IncredibleEmployment.be  This test is no t yet approved or cleared by the [place] and  has been authorized  for  detection and/or diagnosis of SARS-CoV-2 by FDA under an Emergency Use Authorization (EUA). This EUA will remain  in effect (meaning this test can be used) for the duration of the COVID-19 declaration under Section 564(b)(1) of the Act, 21 U.S.C.section 360bbb-3(b)(1), unless the authorization is terminated  or revoked sooner.       Influenza A by PCR NEGATIVE NEGATIVE Final   Influenza B by PCR NEGATIVE NEGATIVE Final    Comment: (NOTE) The Xpert Xpress SARS-CoV-2/FLU/RSV plus assay is intended as an aid in the diagnosis of influenza from Nasopharyngeal swab specimens and should not be used as a sole basis for treatment. Nasal washings and aspirates are unacceptable for Xpert Xpress SARS-CoV-2/FLU/RSV testing.  Fact Sheet for Patients: EntrepreneurPulse.com.au  Fact Sheet for Healthcare Providers: IncredibleEmployment.be  This test is not yet approved or cleared by the [place] FDA and has been authorized for detection and/or diagnosis of SARS-CoV-2 by FDA under an Emergency Use Authorization (EUA). This EUA will remain in effect (meaning this test can be used) for the duration of the COVID-19 declaration under Section 564(b)(1) of the Act, 21 U.S.C. section 360bbb-3(b)(1), unless the authorization is terminated or revoked.  Performed at [facility], [place] [address], [place], [place] [address]   Culture, blood (routine x 2)     Status: None   Collection Time: 07/11/2020  7:44 PM   Specimen: BLOOD  Result Value Ref Range Status   Specimen Description BLOOD LEFT ANTECUBITAL  Final   Special Requests   Final    BOTTLES DRAWN AEROBIC AND ANAEROBIC Blood Culture adequate volume   Culture   Final    NO GROWTH 5 DAYS Performed at [facility], [place] [address], [place], [place] [address]    Report Status 06/23/2020 FINAL  Final  MRSA PCR Screening     Status: None   Collection Time: 07/01/2020 10:00 PM   Specimen: Nasal Mucosa;  Nasopharyngeal  Result Value Ref Range Status   MRSA by PCR NEGATIVE NEGATIVE Final    Comment:        The GeneXpert MRSA Assay (FDA approved for NASAL specimens only), is one component of a comprehensive MRSA colonization surveillance program. It is not intended to diagnose MRSA infection nor to guide or monitor treatment for MRSA infections. Performed at [facility], [place] [address] [place], [place] [address]       Radiology Studies: No results found.  Scheduled Meds: . apixaban  5 mg Oral BID  . dofetilide  250 mcg Oral BID  . gabapentin  600 mg Oral QHS  . levothyroxine  75 mcg Oral Q0600  . LORazepam  1 mg Oral QHS  . metoprolol succinate  25 mg Oral QHS  . montelukast  10 mg Oral QHS  . sodium chloride flush  3 mL Intravenous Q12H  . theophylline  200 mg Oral BID   Continuous Infusions: . sodium chloride    . lactated ringers    . magnesium sulfate bolus IVPB       LOS: 4 days   Time spent: 27 minutes   Darliss Cheney, MD [facility]  06/23/2020, 10:48 AM   To contact the attending provider between 7A-7P or the covering provider during after hours 7P-7A, please log into the web site [email or web address].

## 2020-06-23 NOTE — Progress Notes (Addendum)
Morning EKG reviewed  Shows remains in NSR at 82 bpm with stable QTc at ~470 ms when measured manually  Continue Tikosyn 250 mcg BID.   Pt has completed tikosyn load and is stable for D/C from an EP perspective on current dose.   Shirley Friar, PA-C  Pager: [phone]  06/23/2020 12:00 PM   Salome Spotted

## 2020-06-23 NOTE — Progress Notes (Signed)
Heart Failure Stewardship Pharmacist Progress Note   PCP: [name], MD PCP-Cardiologist: [name], MD    HPI:  75 yo F with PMH of afib (self-discontinued PTA Eliquis), HLD, HTN, and PVD. She presented to the ED with weakness and palpitations. She was found to be in aflutter with RVR. An ECHO was done on 06/19/20 and LVEF is reduced to 35-40% (was 55-60% in 04/2018). S/p DCCV on Jun 25, 2020 (TEE with LVEF 47%) and has been started on Tikosyn.   Current HF Medications: Furosemide 20 mg daily Metoprolol XL 25 mg daily  Prior to admission HF Medications: Furosemide 40 mg daily  Pertinent Lab Values: . Serum creatinine 0.71, BUN 7, Potassium 4.4, Sodium 134, BNP 1162, Magnesium 1.9   Vital Signs: . Weight: 132 lbs (admission weight: 145 lbs) . Blood pressure: 80-110/60s  . Heart rate: 70-80s  Medication Assistance / Insurance Benefits Check: Does the patient have prescription insurance?  Yes Type of insurance plan: BCBS Medicare  Does the patient qualify for medication assistance through manufacturers or grants?   Pending household income information . Eligible grants and/or patient assistance programs: pending . Medication assistance applications in progress: none  . Medication assistance applications approved: none Approved medication assistance renewals will be completed by: [place]:  [facility] Is the patient willing to use [facility] pharmacy at discharge? Yes Is the patient willing to transition their outpatient pharmacy to utilize a [facility] outpatient pharmacy?   Pending    Assessment: 1. Acute systolic CHF (EF 73-42%), likely due to aflutter RVR. NYHA class II symptoms. S/p DCCV on 07/02/2020. - Continue furosemide 20 mg daily - Agree with transitioning metoprolol tartrate to metoprolol XL 25 mg daily - No ACE/ARB/ARNI with low BP, may be able to start Entresto if BP stabilizes - Consider adding Jardiance  for CHF optimization Wilder Glade is not covered on her insurance). No drug interactions noted with Tikosyn.   Plan: 1) Medication changes recommended at this time: - Add Jardiance 10 mg daily  2) Patient assistance application(s): - None pending - Wilder Glade is not covered on her insurance; prefers Geneticist, molecular (copay $37 per month)  3)  Education  - To be completed prior to discharge  Kerby Nora, PharmD, BCPS Heart Failure Cytogeneticist Phone [phone]

## 2020-06-23 NOTE — Progress Notes (Addendum)
Electrophysiology Rounding Note  Patient Name: Crystal [name] Date of Encounter: 06/23/2020  Primary Cardiologist: [name], MD  Electrophysiologist: Dr. Lovena Le   Subjective   Pt  remains in NSR this am  on Tikosyn 250 mcg BID   QTc from EKG last pm shows stable QTc at ~340 ms (has been higher)  The patient is doing well today.  At this time, the patient denies chest pain, shortness of breath, or any new concerns.  Inpatient Medications    Scheduled Meds:  apixaban  5 mg Oral BID   dofetilide  250 mcg Oral BID   furosemide  40 mg Intravenous Daily   gabapentin  600 mg Oral QHS   levothyroxine  75 mcg Oral Q0600   LORazepam  1 mg Oral QHS   metoprolol tartrate  25 mg Oral BID   montelukast  10 mg Oral QHS   sodium chloride flush  3 mL Intravenous Q12H   theophylline  200 mg Oral BID   Continuous Infusions:  sodium chloride     diltiazem (CARDIZEM) infusion Stopped (06/26/2020 1211)   lactated ringers     PRN Meds: sodium chloride, acetaminophen, alum & mag hydroxide-simeth, loperamide, ondansetron (ZOFRAN) IV, sodium chloride flush, traMADol   Vital Signs    Vitals:   06/22/20 1556 06/22/20 1935 06/22/20 2320 06/23/20 0520  BP: (!) 102/52 105/65 (!) 93/52 112/82  Pulse: 85 84 83 79  Resp: 19 15 15 15   Temp: 98.6 F (37 C) 98.2 F (36.8 C) 97.6 F (36.4 C) 97.8 F (36.6 C)  TempSrc: Axillary Oral Oral Oral  SpO2: 98% 96% 99% 95%  Weight:    59.9 kg  Height:        Intake/Output Summary (Last 24 hours) at 06/23/2020 0726 Last data filed at 06/23/2020 0520 Gross per 24 hour  Intake --  Output 2952 ml  Net -2952 ml   Filed Weights   06/21/20 0617 06/22/20 0306 06/23/20 0520  Weight: 65.9 kg 61.9 kg 59.9 kg    Physical Exam    GEN- The patient is well appearing, alert and oriented x 3 today.   Head- normocephalic, atraumatic Eyes-  Sclera clear, conjunctiva pink Ears- hearing intact Oropharynx- clear Neck- supple Lungs- Clear to ausculation  bilaterally, normal work of breathing Heart- Regular rate and rhythm, no murmurs, rubs or gallops GI- soft, NT, ND, + BS Extremities- no clubbing, cyanosis, or edema Skin- no rash or lesion Psych- euthymic mood, full affect Neuro- strength and sensation are intact  Labs    CBC Recent Labs    06/22/20 0120  WBC 14.6*  NEUTROABS 10.4*  HGB 9.8*  HCT 32.6*  MCV 82.5  PLT 097*   Basic Metabolic Panel Recent Labs    06/22/20 0120 06/23/20 0052  NA 134* 134*  K 3.7 4.4  CL 98 99  CO2 25 24  GLUCOSE 87 90  BUN 7* 7*  CREATININE 0.76 0.71  CALCIUM 8.6* 8.8*  MG 1.5* 1.9    Potassium  Date/Time Value Ref Range Status  06/23/2020 12:52 AM 4.4 3.5 - 5.1 mmol/L Final   Magnesium  Date/Time Value Ref Range Status  06/23/2020 12:52 AM 1.9 1.7 - 2.4 mg/dL Final    Comment:    Performed at [facility], [place] [address], [place], [place] [address]    Telemetry    NSR 90-100s currently (personally reviewed)  Radiology    DG CHEST PORT 1 VIEW  Result Date: 06/21/2020 CLINICAL DATA:  75 year old female with congestive heart failure. EXAM: PORTABLE CHEST 1 VIEW COMPARISON:  07/02/2020 FINDINGS: Unchanged cardiomegaly. The cardiomediastinal silhouette is otherwise within normal limits. Unchanged small left pleural effusion. Slight interval increased bibasilar subsegmental atelectasis. No new focal consolidative opacities. Visualized osseous structures are within normal limits. IMPRESSION: Slight interval increased bibasilar subsegmental atelectasis. Unchanged small left pleural effusion. Unchanged cardiomegaly. Electronically Signed   By: Ruthann Cancer MD   On: 06/21/2020 08:35     Patient Profile     Crystal [name] is a 75 y.o. female with a past medical history significant for persistent atrial fibrillation.  They were admitted for tikosyn load.   Assessment & Plan    Persistent atrial fibrillation/flutter Has been in and out of rhythm on Tikosyn 250 mcg BID   Continue Eliquis Electrolytes stable. K 4.4 and Mg 1.9. Will gently supp Mg.  CHA2DS2VASC is at least 5.  2. Hypokalemia Improved  3. Acute systolic CHF Likely tachy induced.  Continue current meds. Add losartan at DC  4. COPD No wheezing on exam.   This am dose of tikosyn will be #6 and if QTc stable will have completed drug monitoring. She will be stable for that dose moving forward from an EP perspective pending EKG.  For questions or updates, please contact [place] Please consult [email or web address] for contact info under Cardiology/STEMI.  Signed, Shirley Friar, PA-C  06/23/2020, 7:26 AM   EP Attending  Patient seen and examined. She remains stable today with an acceptable QT, and mostly NSR. Her bp is a little low but I suspect that this will improve once she goes home and eats her own food and not hospital food. Followup in the office for a repeat ECG and labs.   Carleene Overlie Nazyia Gaugh,MD

## 2020-06-23 NOTE — TOC Benefit Eligibility Note (Signed)
Transition of Care [facility]) Benefit Eligibility Note    Patient Details  Name: Crystal [name] MRN: [id number] Date of Birth: 1945/12/04   Medication/Dose: DOFETILIDE   250 MCG BID  Covered?: Yes  Tier:  (TIER- 4 DRUG)  Prescription Coverage [facility]  and  [facility] with Person/Company/Phone Number:: PEGGY  @ [employer] # [phone]  Co-Pay: $84.82  Prior Approval: No  Deductible: Met  Additional Notes: TIKOSYN 250 MC BID: NOT  COVER/ NON-FORMULARY   P/A  YES # [phone]    Memory Argue Phone Number: 06/23/2020, 1:26 PM

## 2020-06-23 NOTE — Progress Notes (Signed)
Pharmacy: Dofetilide (Tikosyn) - Follow Up Assessment and Electrolyte Replacement  Pharmacy consulted to assist in monitoring and replacing electrolytes in this 75 y.o. female admitted on 06/22/20 undergoing dofetilide initiation. First dofetilide dose: 250 mcg q12 hrs started 07/02/2020.  Labs:    Component Value Date/Time   K 4.4 06/23/2020 0052   MG 1.9 06/23/2020 0052     Plan: Potassium: K >/= 4: No additional supplementation needed  Magnesium: Mg 1.8-2: Give Mg 2 gm IV x1   Arrie Senate, PharmD, [place], [facility] Clinical Pharmacist [phone] Please check AMION for all [facility] Pharmacy numbers 06/23/2020

## 2020-06-23 NOTE — Plan of Care (Signed)
  Problem: Education: Goal: Knowledge of General Education information will improve Description: Including pain rating scale, medication(s)/side effects and non-pharmacologic comfort measures Outcome: Progressing   Problem: Health Behavior/Discharge Planning: Goal: Ability to manage health-related needs will improve Outcome: Progressing   Problem: Clinical Measurements: Goal: Ability to maintain clinical measurements within normal limits will improve Outcome: Progressing Goal: Will remain free from infection Outcome: Progressing Goal: Diagnostic test results will improve Outcome: Progressing Goal: Respiratory complications will improve Outcome: Progressing Goal: Cardiovascular complication will be avoided Outcome: Progressing   Problem: Activity: Goal: Risk for activity intolerance will decrease Outcome: Progressing   Problem: Nutrition: Goal: Adequate nutrition will be maintained Outcome: Progressing   Problem: Coping: Goal: Level of anxiety will decrease Outcome: Progressing   Problem: Elimination: Goal: Will not experience complications related to bowel motility Outcome: Progressing Goal: Will not experience complications related to urinary retention Outcome: Progressing   Problem: Pain Managment: Goal: General experience of comfort will improve Outcome: Progressing   Problem: Safety: Goal: Ability to remain free from injury will improve Outcome: Progressing   Problem: Skin Integrity: Goal: Risk for impaired skin integrity will decrease Outcome: Progressing   Problem: Education: Goal: Knowledge of disease or condition will improve Outcome: Progressing Goal: Understanding of medication regimen will improve Outcome: Progressing Goal: Individualized Educational Video(s) Outcome: Progressing   Problem: Activity: Goal: Ability to tolerate increased activity will improve Outcome: Progressing   Problem: Cardiac: Goal: Ability to achieve and maintain  adequate cardiopulmonary perfusion will improve Outcome: Progressing   

## 2020-06-23 NOTE — Progress Notes (Signed)
Progress Note  Patient Name: Crystal [name] Date of Encounter: 06/23/2020  [facility] Cardiologist: [name], MD   Subjective   No CP or dyspnea  Inpatient Medications    Scheduled Meds: . apixaban  5 mg Oral BID  . dofetilide  250 mcg Oral BID  . furosemide  40 mg Intravenous Daily  . gabapentin  600 mg Oral QHS  . levothyroxine  75 mcg Oral Q0600  . LORazepam  1 mg Oral QHS  . metoprolol tartrate  25 mg Oral BID  . montelukast  10 mg Oral QHS  . sodium chloride flush  3 mL Intravenous Q12H  . theophylline  200 mg Oral BID   Continuous Infusions: . sodium chloride    . diltiazem (CARDIZEM) infusion Stopped (07/14/2020 1211)  . lactated ringers     PRN Meds: sodium chloride, acetaminophen, alum & mag hydroxide-simeth, loperamide, ondansetron (ZOFRAN) IV, sodium chloride flush, traMADol   Vital Signs    Vitals:   06/22/20 1556 06/22/20 1935 06/22/20 2320 06/23/20 0520  BP: (!) 102/52 105/65 (!) 93/52 112/82  Pulse: 85 84 83 79  Resp: 19 15 15 15   Temp: 98.6 F (37 C) 98.2 F (36.8 C) 97.6 F (36.4 C) 97.8 F (36.6 C)  TempSrc: Axillary Oral Oral Oral  SpO2: 98% 96% 99% 95%  Weight:    59.9 kg  Height:        Intake/Output Summary (Last 24 hours) at 06/23/2020 0744 Last data filed at 06/23/2020 0520 Gross per 24 hour  Intake -  Output 2952 ml  Net -2952 ml   Last 3 Weights 06/23/2020 06/22/2020 06/21/2020  Weight (lbs) 132 lb 0.9 oz 136 lb 7.4 oz 145 lb 4.5 oz  Weight (kg) 59.9 kg 61.9 kg 65.9 kg      Telemetry    Sinus with PACs and PVCs- Personally Reviewed  Physical Exam   GEN: No acute distress.   Neck: No JVD Cardiac: RRR Respiratory: Clear to auscultation bilaterally. GI: Soft, nontender, non-distended  MS: Trace edema with chronic skin changes Neuro: Blind Psych: Normal affect   Labs    High Sensitivity Troponin:   Recent Labs  Lab 06/17/20 1607 07/01/2020 1115 06/30/2020 1401  TROPONINIHS 38* 42* 53*      Chemistry Recent  Labs  Lab 06/17/2020 1121 06/19/20 0046 06/21/20 0104 06/22/20 0120 06/23/20 0052  NA 132*   < > 134* 134* 134*  K 3.2*   < > 4.5 3.7 4.4  CL 94*   < > 101 98 99  CO2 22   < > 23 25 24   GLUCOSE 85   < > 90 87 90  BUN 15   < > 11 7* 7*  CREATININE 1.95*   < > 0.96 0.76 0.71  CALCIUM 8.6*   < > 8.4* 8.6* 8.8*  PROT 5.8*  --   --   --   --   ALBUMIN 3.4*  --   --   --   --   AST 35  --   --   --   --   ALT 16  --   --   --   --   ALKPHOS 136*  --   --   --   --   BILITOT 1.4*  --   --   --   --   GFRNONAA 26*   < > >60 >60 >60  ANIONGAP 16*   < > 10 11 11    < > =  values in this interval not displayed.     Hematology Recent Labs  Lab 06/19/20 0046 07/04/2020 0102 06/22/20 0120  WBC 20.5* 20.7* 14.6*  RBC 4.25 4.16 3.95  HGB 10.3* 10.0* 9.8*  HCT 35.8* 34.6* 32.6*  MCV 84.2 83.2 82.5  MCH 24.2* 24.0* 24.8*  MCHC 28.8* 28.9* 30.1  RDW 20.7* 20.5* 21.2*  PLT 523* 544* 490*    BNP Recent Labs  Lab 07/14/2020 1124  BNP 1,162.3*     Radiology    DG CHEST PORT 1 VIEW  Result Date: 06/21/2020 CLINICAL DATA:  75 year old female with congestive heart failure. EXAM: PORTABLE CHEST 1 VIEW COMPARISON:  06/27/2020 FINDINGS: Unchanged cardiomegaly. The cardiomediastinal silhouette is otherwise within normal limits. Unchanged small left pleural effusion. Slight interval increased bibasilar subsegmental atelectasis. No new focal consolidative opacities. Visualized osseous structures are within normal limits. IMPRESSION: Slight interval increased bibasilar subsegmental atelectasis. Unchanged small left pleural effusion. Unchanged cardiomegaly. Electronically Signed   By: Ruthann Cancer MD   On: 06/21/2020 08:35    Cardiac Studies   1. There is significant change since the study on 05/02/2018. LVEF has  decreased from 55-60% to 35-40% with diffuse hypokinesis. RV is newly  severely dilated with severe systolic dysfunction, severe tricuspid  regurgitation and moderate pulmonary   hypertension RVSP 48 mmHg. Large left pleural effusion.  2. Left ventricular ejection fraction, by estimation, is 35 to 40%. The  left ventricle has moderately decreased function. The left ventricle has  no regional wall motion abnormalities. There is mild concentric left  ventricular hypertrophy. Left  ventricular diastolic function could not be evaluated. There is the  interventricular septum is flattened in systole and diastole, consistent  with right ventricular pressure and volume overload.  3. Right ventricular systolic function is severely reduced. The right  ventricular size is severely enlarged. There is moderately elevated  pulmonary artery systolic pressure. The estimated right ventricular  systolic pressure is 99991111 mmHg.  4. Right atrial size was moderately dilated.  5. Large pleural effusion in the left lateral region.  6. The mitral valve is normal in structure. Moderate mitral valve  regurgitation. No evidence of mitral stenosis.  7. Tricuspid valve regurgitation is severe.  8. The aortic valve is normal in structure. Aortic valve regurgitation is  not visualized. No aortic stenosis is present.  9. The inferior vena cava is dilated in size with <50% respiratory  variability, suggesting right atrial pressure of 15 mmHg.    1. No LAA thrombus. Successful DCCV after TEE with restoration of NSR.  2. Left ventricular ejection fraction, by estimation, is 45 to 50%. Left  ventricular ejection fraction by 3D volume is 47 %. The left ventricle has  mildly decreased function. The left ventricle demonstrates global  hypokinesis. There is the  interventricular septum is flattened in diastole ('D' shaped left  ventricle), consistent with right ventricular volume overload.  3. Severe TR is present. 2D PISA 0.9 cm. 2D ERO 0.50 cm2, R vol 37 cc. 3D  VCA 0.54 cm2, R vol 40 cc. RF ~51% consistent with severe TR, whic  accounts for low SV (SVI=22 cc/m2). TR is central and  functional, likely  related to atrial flutter/fibrillation.  The tricuspid valve is abnormal. Tricuspid valve regurgitation is severe.  4. Right ventricular systolic function is severely reduced. The right  ventricular size is severely enlarged.  5. Left atrial size was mild to moderately dilated. No left atrial/left  atrial appendage thrombus was detected. The LAA emptying velocity was  63  cm/s.  6. Right atrial size was severely dilated.  7. A small pericardial effusion is present. The pericardial effusion is  surrounding the apex.  8. The mitral valve is grossly normal. Mild to moderate mitral valve  regurgitation. No evidence of mitral stenosis.  9. The aortic valve is grossly normal. Aortic valve regurgitation is not  visualized. No aortic stenosis is present.  10. There is mild (Grade II) layered plaque involving the aortic root.  11. Evidence of atrial level shunting detected by color flow Doppler.  Agitated saline contrast bubble study was positive with shunting observed  within 3-6 cardiac cycles suggestive of interatrial shunt.   Patient Profile     75 y.o. female admitted with atrial fibrillation with rapid ventricular response, hypertension, cardiomyopathy and CHF.  Assessment & Plan   1 paroxysmal atrial fibrillation/flutter-patient remains in sinus rhythm this morning.  We will continue Tikosyn at present dose (QT unchanged).  Continue metoprolol.  Continue apixaban.  Hopefully she will maintain sinus rhythm with Tikosyn therapy.  If not could consider referral for ablation.  2 cardiomyopathy-LV function decreased compared to previous.  This is felt possibly to be tachycardia mediated.  We will continue metoprolol.  Will not add ARB at this point as blood pressure is borderline.  Plan to repeat echocardiogram in 3 to 6 months to see if LV function has improved.  3 acute combined systolic/diastolic congestive heart failure-change Lasix to 40 mg by mouth daily and  follow renal function.  4 pulmonary fibrosis-follow-up primary care after discharge.  Need to mobilize.  Possible discharge next 24 hours if stable.  For questions or updates, please contact [facility] Please consult [email or web address] for contact info under        Signed, [name], MD  06/23/2020, 7:44 AM

## 2020-06-24 DIAGNOSIS — I4891 Unspecified atrial fibrillation: Secondary | ICD-10-CM | POA: Diagnosis not present

## 2020-06-24 LAB — BASIC METABOLIC PANEL
Anion gap: 11 (ref 5–15)
BUN: 7 mg/dL — ABNORMAL LOW (ref 8–23)
CO2: 28 mmol/L (ref 22–32)
Calcium: 9.1 mg/dL (ref 8.9–10.3)
Chloride: 95 mmol/L — ABNORMAL LOW (ref 98–111)
Creatinine, Ser: 0.75 mg/dL (ref 0.44–1.00)
GFR, Estimated: >60 mL/min (ref >60)
Glucose, Bld: 82 mg/dL (ref 70–99)
Potassium: 4.8 mmol/L (ref 3.5–5.1)
Sodium: 134 mmol/L — ABNORMAL LOW (ref 135–145)

## 2020-06-24 LAB — MAGNESIUM: Magnesium: 1.8 mg/dL (ref 1.7–2.4)

## 2020-06-24 MED ORDER — METOPROLOL SUCCINATE ER 50 MG PO TB24
50.0000 mg | ORAL_TABLET | Freq: Every day | ORAL | Status: DC
  Administered 2020-06-24: 50 mg via ORAL
  Filled 2020-06-24: qty 1

## 2020-06-24 MED ORDER — MAGNESIUM SULFATE 2 GM/50ML IV SOLN
2.0000 g | Freq: Once | INTRAVENOUS | Status: AC
  Administered 2020-06-24: 2 g via INTRAVENOUS
  Filled 2020-06-24: qty 50

## 2020-06-24 MED ORDER — METOPROLOL TARTRATE 25 MG PO TABS
25.0000 mg | ORAL_TABLET | Freq: Four times a day (QID) | ORAL | Status: DC
  Administered 2020-06-24 – 2020-06-25 (×2): 25 mg via ORAL
  Filled 2020-06-24 (×2): qty 1

## 2020-06-24 MED ORDER — MAGNESIUM OXIDE 400 (241.3 MG) MG PO TABS
400.0000 mg | ORAL_TABLET | Freq: Every day | ORAL | Status: DC
  Administered 2020-06-25: 400 mg via ORAL
  Filled 2020-06-24: qty 1

## 2020-06-24 NOTE — Plan of Care (Signed)
  Problem: Education: Goal: Knowledge of General Education information will improve Description: Including pain rating scale, medication(s)/side effects and non-pharmacologic comfort measures Outcome: Progressing   Problem: Health Behavior/Discharge Planning: Goal: Ability to manage health-related needs will improve Outcome: Progressing   Problem: Clinical Measurements: Goal: Ability to maintain clinical measurements within normal limits will improve Outcome: Progressing Goal: Will remain free from infection Outcome: Progressing Goal: Diagnostic test results will improve Outcome: Progressing Goal: Respiratory complications will improve Outcome: Progressing Goal: Cardiovascular complication will be avoided Outcome: Progressing   Problem: Activity: Goal: Risk for activity intolerance will decrease Outcome: Progressing   Problem: Nutrition: Goal: Adequate nutrition will be maintained Outcome: Progressing   Problem: Coping: Goal: Level of anxiety will decrease Outcome: Progressing   Problem: Elimination: Goal: Will not experience complications related to bowel motility Outcome: Progressing Goal: Will not experience complications related to urinary retention Outcome: Progressing   Problem: Pain Managment: Goal: General experience of comfort will improve Outcome: Progressing   Problem: Safety: Goal: Ability to remain free from injury will improve Outcome: Progressing   Problem: Skin Integrity: Goal: Risk for impaired skin integrity will decrease Outcome: Progressing   Problem: Education: Goal: Knowledge of disease or condition will improve Outcome: Progressing Goal: Understanding of medication regimen will improve Outcome: Progressing Goal: Individualized Educational Video(s) Outcome: Progressing   Problem: Activity: Goal: Ability to tolerate increased activity will improve Outcome: Progressing   Problem: Cardiac: Goal: Ability to achieve and maintain  adequate cardiopulmonary perfusion will improve Outcome: Progressing   

## 2020-06-24 NOTE — Progress Notes (Signed)
Heart Failure Stewardship Pharmacist Progress Note   PCP: [name], MD PCP-Cardiologist: [name], MD    HPI:  75 yo F with PMH of afib (self-discontinued PTA Eliquis), HLD, HTN, and PVD. She presented to the ED with weakness and palpitations. She was found to be in aflutter with RVR. An ECHO was done on 06/19/20 and LVEF is reduced to 35-40% (was 55-60% in 04/2018). S/p DCCV on 06/23/2020 (TEE with LVEF 47%) and has been started on Tikosyn.   Current HF Medications: Furosemide 20 mg daily Metoprolol XL 50 mg daily Jardiance 10 mg daily  Prior to admission HF Medications: Furosemide 40 mg daily  Pertinent Lab Values: . Serum creatinine 0.75, BUN 7, Potassium 4.8, Sodium 134, BNP 1162, Magnesium 1.8   Vital Signs: . Weight: 126 lbs (admission weight: 145 lbs) . Blood pressure: 90-120/60-80s  . Heart rate: 90-120s  Medication Assistance / Insurance Benefits Check: Does the patient have prescription insurance?  Yes Type of insurance plan: [employer]   Outpatient Pharmacy:  Prior to admission [facility] Is the patient willing to use [place] pharmacy at discharge? Yes Is the patient willing to transition their outpatient pharmacy to utilize a [facility] outpatient pharmacy?   Pending    Assessment: 1. Acute systolic CHF (EF 79-03%), likely due to aflutter RVR. NYHA class II symptoms. S/p DCCV on 07/11/2020. - Continue furosemide 20 mg daily - Agree with increasing metoprolol XL to 50 mg daily - No ACE/ARB/ARNI with low BP, may be able to start Entresto if BP stabilizes - Continue Jardiance 10 mg daily (Wilder Glade is not covered on her insurance). No drug interactions noted with Tikosyn.   Plan: 1) Medication changes recommended at this time: - Agree with increasing metoprolol as noted above  2) Patient assistance application(s): - None pending - Wilder Glade is not covered on her insurance; prefers [facility], [facility] (copay $37 per month)  3)  Education  -  To be completed prior to discharge  Kerby Nora, PharmD, BCPS Heart Failure Cytogeneticist Phone [phone]

## 2020-06-24 NOTE — Progress Notes (Signed)
Paged Dr. Blossom Hoops in regards to hr, will continue to monitor pt.

## 2020-06-24 NOTE — Progress Notes (Signed)
PROGRESS NOTE    Crystal [name]  PYP:[id number] DOB: [birth year] DOA: 06/23/2020 PCP: [name], MD   Brief Narrative:  Crystal [name] is a 75 y.o. female with medical history significant of afib, s/p cardioversion about 2 years ago, no longer on anticoagulation, IBS, IPF, hypertension, hyperlipidemia and peripheral vascular disease came to ED with generalized weakness and palpitations.  She talked to her PCP due to persistent symptoms who had advised her to come to the ED.  Patient denied any chest pain or shortness of breath.  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  Did not had A. fib since she had her cardioversion approximately 2 years ago.  On arrival to ED, she was found to be in A. fib/flutter with RVR, labs pertinent for leukocytosis of 22, sodium of 132, potassium of 3.2, creatinine of 1.95, troponin of 42>>53, lactic acid of 2.3, COVID-19 negative, chest x-ray with small bilateral pleural effusions, left greater than right, left base atelectasis/infiltrate.  She was admitted to hospital service.  Started on Cardizem drip as well as heparin drip.  Cardiology consulted.  Underwent DCCV cardioversion on 07/13/2020 and started on Tikosyn afterwards.  Assessment & Plan:   Active Problems:   Atrial fibrillation with rapid ventricular response (HCC)   Atrial fibrillation with controlled ventricular rate (HCC)   A. fib with RVR: Reportedly, patient had successful DCCV cardioversion on 07/13/2020 and remained in sinus rhythm until the morning of 06/21/2020. She was started on Tikosyn. She has been in and out of normal sinus rhythm.  Apparently, she was in sinus rhythm once again today when seen by cardiology but when I saw her, she was in atrial fibrillation with rates around 124 however she was not having any symptoms.  Per patient, cardiology had told her that she will need to stay in the hospital 1 more night.  Patient also little uncomfortable going home with current  heart rate.  Will observe overnight.  Acute on chronic hypoxic respiratory failure: Patient was recently discharged home with as needed oxygen of 2 L. Currently down to 2 L requirement. Chest x-ray shows atelectasis. I once again encouraged incentive spirometry.  Acute systolic congestive heart failure: Appears euvolemic.  She was on IV Lasix which was transitioned to oral Lasix 20 mg on 06/23/2020.  Continue that.  Elevated troponin: Slightly elevated but flat troponin without having chest pain or shortness of breath does not indicate ACS and instead possible demand ischemia.  Echo with normal ejection fraction and no wall motion abnormality..  Chronic leukocytosis: Chest x-ray with concern of left base infiltrate, patient afebrile and no upper respiratory symptoms so doubt pneumonia.  Minimally elevated procalcitonin.  Watch off of antibiotics.  She has chronic leukocytosis that is a stable.  Lactic acidosis.  Most likely secondary to hypoxia with A. fib with  RVR.resolved.  AKI. Resolved.  Hypokalemia.  Resolved.  Pleural effusion.  Secondary to acute congestive heart failure.  Management as above.  History of IBS and chronic intermittent diarrhea.  Patient do get flares, no acute concern.  Most recent flare was approximately 2 weeks ago. She is having intermittent diarrhea. Requesting Imodium. We will do that.  Acute hyponatremia: Improved and close to being normal.  DVT prophylaxis: Eliquis   Code Status: Full Code  Family Communication:  husband present at bedside.  Plan of care discussed with patient in length and he verbalized understanding and agreed with it.  Status is: Inpatient  Remains inpatient appropriate because:Ongoing  diagnostic testing needed not appropriate for outpatient work up   Dispo: The patient is from: Home              Anticipated d/c is to: Home              Anticipated d/c date is: 1 day              Patient currently is not medically stable to  d/c.             Estimated body mass index is 22.42 kg/m as calculated from the following:   Height as of this encounter: 5\' 3"  (1.6 m).   Weight as of this encounter: 57.4 kg.      Nutritional status:               Consultants:   Cardiology  Procedures:   None  Antimicrobials:  Anti-infectives (From admission, onward)   None         Subjective: Seen and examined.  No complaints as always.  Was in A. fib with rates around 124 when I saw her.  Objective: Vitals:   06/23/20 1130 06/23/20 2106 06/23/20 2319 06/24/20 0846  BP: 102/67 126/77 131/87 126/69  Pulse: 78 (!) 116 (!) 110 (!) 101  Resp: 14 15 18 18   Temp: 98.1 F (36.7 C) 97.6 F (36.4 C) 97.8 F (36.6 C)   TempSrc: Axillary Oral Oral   SpO2: 97% 99% 100% 98%  Weight:   57.4 kg   Height:        Intake/Output Summary (Last 24 hours) at 06/24/2020 1024 Last data filed at 06/23/2020 2106 Gross per 24 hour  Intake --  Output 750 ml  Net -750 ml   Filed Weights   06/22/20 0306 06/23/20 0520 06/23/20 2319  Weight: 61.9 kg 59.9 kg 57.4 kg    Examination:  General exam: Appears calm and comfortable, legally blind bilaterally Respiratory system: Faint crackles at the bases bilaterally. Respiratory effort normal. Cardiovascular system: S1 & S2 heard, RRR. No JVD, murmurs, rubs, gallops or clicks.  Pitting edema +2 left lower extremity and trace pitting edema right lower extremity. Gastrointestinal system: Abdomen is nondistended, soft and nontender. No organomegaly or masses felt. Normal bowel sounds heard. Central nervous system: Alert and oriented. No focal neurological deficits. Extremities: Symmetric 5 x 5 power. Skin: No rashes, lesions or ulcers.  Psychiatry: Judgement and insight appear normal. Mood & affect appropriate.    Data Reviewed: I have personally reviewed following labs and imaging studies  CBC: Recent Labs  Lab 06/17/20 1607 07/14/2020 1115 06/19/20 0046  07/02/2020 0102 06/22/20 0120  WBC 15.6* 22.0* 20.5* 20.7* 14.6*  NEUTROABS  --  16.0*  --   --  10.4*  HGB 11.1* 11.2* 10.3* 10.0* 9.8*  HCT 37.2 40.3 35.8* 34.6* 32.6*  MCV 84.5 86.5 84.2 83.2 82.5  PLT 687* 544* 523* 544* [id number]*   Basic Metabolic Panel: Recent Labs  Lab 06/24/2020 0102 06/21/20 0104 06/22/20 0120 06/23/20 0052 06/24/20 0042 06/24/20 0654  NA 129* 134* 134* 134* 134*  --   K 4.6 4.5 3.7 4.4 4.8  --   CL 98 101 98 99 95*  --   CO2 21* 23 25 24 28   --   GLUCOSE 109* 90 87 90 82  --   BUN 16 11 7* 7* 7*  --   CREATININE 1.48* 0.96 0.76 0.71 0.75  --   CALCIUM 8.3* 8.4* 8.6* 8.8* 9.1  --  MG 1.8 2.1 1.5* 1.9  --  1.8   GFR: Estimated Creatinine Clearance: 49.5 mL/min (by C-G formula based on SCr of 0.75 mg/dL). Liver Function Tests: Recent Labs  Lab 06/29/2020 1121  AST 35  ALT 16  ALKPHOS 136*  BILITOT 1.4*  PROT 5.8*  ALBUMIN 3.4*   No results for input(s): LIPASE, AMYLASE in the last 168 hours. No results for input(s): AMMONIA in the last 168 hours. Coagulation Profile: Recent Labs  Lab 06/21/2020 0102  INR 1.2   Cardiac Enzymes: No results for input(s): CKTOTAL, CKMB, CKMBINDEX, TROPONINI in the last 168 hours. BNP (last 3 results) No results for input(s): PROBNP in the last 8760 hours. HbA1C: No results for input(s): HGBA1C in the last 72 hours. CBG: No results for input(s): GLUCAP in the last 168 hours. Lipid Profile: No results for input(s): CHOL, HDL, LDLCALC, TRIG, CHOLHDL, LDLDIRECT in the last 72 hours. Thyroid Function Tests: No results for input(s): TSH, T4TOTAL, FREET4, T3FREE, THYROIDAB in the last 72 hours. Anemia Panel: No results for input(s): VITAMINB12, FOLATE, FERRITIN, TIBC, IRON, RETICCTPCT in the last 72 hours. Sepsis Labs: Recent Labs  Lab 06/14/2020 1124 07/08/2020 1401 06/19/20 1257 06/19/20 1620 06/21/20 1119 06/21/20 1431  PROCALCITON 0.21  --   --   --   --   --   LATICACIDVEN  --    < > 2.3* 3.0* 1.9 1.5   < >  = values in this interval not displayed.    Recent Results (from the past 240 hour(s))  Culture, blood (routine x 2)     Status: None   Collection Time: 06/24/2020  2:01 PM   Specimen: BLOOD  Result Value Ref Range Status   Specimen Description BLOOD LEFT ANTECUBITAL  Final   Special Requests   Final    BOTTLES DRAWN AEROBIC AND ANAEROBIC Blood Culture adequate volume   Culture   Final    NO GROWTH 5 DAYS Performed at [facility], [address], [place], [place] [address]    Report Status 06/23/2020 FINAL  Final  Resp Panel by RT-PCR (Flu A&B, Covid) Nasopharyngeal Swab     Status: None   Collection Time: 07/12/2020  6:28 PM   Specimen: Nasopharyngeal Swab; Nasopharyngeal(NP) swabs in vial transport medium  Result Value Ref Range Status   SARS Coronavirus 2 by RT PCR NEGATIVE NEGATIVE Final    Comment: (NOTE) SARS-CoV-2 target nucleic acids are NOT DETECTED.  The SARS-CoV-2 RNA is generally detectable in upper respiratory specimens during the acute phase of infection. The lowest concentration of SARS-CoV-2 viral copies this assay can detect is 138 copies/mL. A negative result does not preclude SARS-Cov-2 infection and should not be used as the sole basis for treatment or other patient management decisions. A negative result may occur with  improper specimen collection/handling, submission of specimen other than nasopharyngeal swab, presence of viral mutation(s) within the areas targeted by this assay, and inadequate number of viral copies(<138 copies/mL). A negative result must be combined with clinical observations, patient history, and epidemiological information. The expected result is Negative.  Fact Sheet for Patients:  EntrepreneurPulse.com.au  Fact Sheet for Healthcare Providers:  IncredibleEmployment.be  This test is no t yet approved or cleared by the [place] FDA and  has been authorized for detection and/or diagnosis  of SARS-CoV-2 by FDA under an Emergency Use Authorization (EUA). This EUA will remain  in effect (meaning this test can be used) for the duration of the COVID-19 declaration under Section  564(b)(1) of the Act, 21 U.S.C.section 360bbb-3(b)(1), unless the authorization is terminated  or revoked sooner.       Influenza A by PCR NEGATIVE NEGATIVE Final   Influenza B by PCR NEGATIVE NEGATIVE Final    Comment: (NOTE) The Xpert Xpress SARS-CoV-2/FLU/RSV plus assay is intended as an aid in the diagnosis of influenza from Nasopharyngeal swab specimens and should not be used as a sole basis for treatment. Nasal washings and aspirates are unacceptable for Xpert Xpress SARS-CoV-2/FLU/RSV testing.  Fact Sheet for Patients: EntrepreneurPulse.com.au  Fact Sheet for Healthcare Providers: IncredibleEmployment.be  This test is not yet approved or cleared by the [place] FDA and has been authorized for detection and/or diagnosis of SARS-CoV-2 by FDA under an Emergency Use Authorization (EUA). This EUA will remain in effect (meaning this test can be used) for the duration of the COVID-19 declaration under Section 564(b)(1) of the Act, 21 U.S.C. section 360bbb-3(b)(1), unless the authorization is terminated or revoked.  Performed at [facility], [place] [address] [place], [place] [address]   Culture, blood (routine x 2)     Status: None   Collection Time: 07/05/2020  7:44 PM   Specimen: BLOOD  Result Value Ref Range Status   Specimen Description BLOOD LEFT ANTECUBITAL  Final   Special Requests   Final    BOTTLES DRAWN AEROBIC AND ANAEROBIC Blood Culture adequate volume   Culture   Final    NO GROWTH 5 DAYS Performed at [facility], [place] [address], [place], [place] [address]    Report Status 06/23/2020 FINAL  Final  MRSA PCR Screening     Status: None   Collection Time: 06/17/2020 10:00 PM   Specimen: Nasal Mucosa; Nasopharyngeal  Result  Value Ref Range Status   MRSA by PCR NEGATIVE NEGATIVE Final    Comment:        The GeneXpert MRSA Assay (FDA approved for NASAL specimens only), is one component of a comprehensive MRSA colonization surveillance program. It is not intended to diagnose MRSA infection nor to guide or monitor treatment for MRSA infections. Performed at [facility], [place] [address] [place], [place] [address]       Radiology Studies: No results found.  Scheduled Meds: . apixaban  5 mg Oral BID  . dofetilide  250 mcg Oral BID  . empagliflozin  10 mg Oral Daily  . furosemide  20 mg Oral Daily  . gabapentin  600 mg Oral QHS  . levothyroxine  75 mcg Oral Q0600  . LORazepam  1 mg Oral QHS  . [START ON 06/26/2020] magnesium oxide  400 mg Oral Daily  . metoprolol succinate  50 mg Oral QHS  . montelukast  10 mg Oral QHS  . sodium chloride flush  3 mL Intravenous Q12H  . theophylline  200 mg Oral BID   Continuous Infusions: . sodium chloride    . lactated ringers    . magnesium sulfate bolus IVPB       LOS: 5 days   Time spent: 26 minutes   Darliss Cheney, MD [facility]  06/24/2020, 10:24 AM   To contact the attending provider between 7A-7P or the covering provider during after hours 7P-7A, please log into the web site [email or web address].

## 2020-06-24 NOTE — Progress Notes (Signed)
HR 130-150 afib, bp 135/86.  Metroprolol 50 given early.  Text paged Dr. Domenic Polite, will monitor pt carefully and await further orders.

## 2020-06-24 NOTE — Progress Notes (Signed)
Pharmacy: Dofetilide (Tikosyn) - Follow Up Assessment and Electrolyte Replacement  Pharmacy consulted to assist in monitoring and replacing electrolytes in this 75 y.o. female admitted on July 04, 2020 undergoing dofetilide initiation. First dofetilide dose: 250 mcg q12 hrs started 06/24/2020.  Labs:    Component Value Date/Time   K 4.8 06/24/2020 0042   MG 1.8 06/24/2020 0654     Plan: Potassium: K >/= 4: No additional supplementation needed  Magnesium: Mg 1.8-2: Give Mg 2 gm IV x1   Arrie Senate, PharmD, [place], [facility] Clinical Pharmacist [phone] Please check AMION for all [facility] Pharmacy numbers 06/24/2020

## 2020-06-24 NOTE — Progress Notes (Signed)
Progress Note  Patient Name: Crystal [name] Date of Encounter: 06/24/2020  [facility] Cardiologist: [name], MD   Subjective   Pt denies CP or dyspnea  Inpatient Medications    Scheduled Meds: . apixaban  5 mg Oral BID  . dofetilide  250 mcg Oral BID  . empagliflozin  10 mg Oral Daily  . furosemide  20 mg Oral Daily  . gabapentin  600 mg Oral QHS  . levothyroxine  75 mcg Oral Q0600  . LORazepam  1 mg Oral QHS  . metoprolol succinate  25 mg Oral QHS  . montelukast  10 mg Oral QHS  . sodium chloride flush  3 mL Intravenous Q12H  . theophylline  200 mg Oral BID   Continuous Infusions: . sodium chloride    . lactated ringers     PRN Meds: sodium chloride, acetaminophen, alum & mag hydroxide-simeth, loperamide, ondansetron (ZOFRAN) IV, oxyCODONE, sodium chloride flush, traMADol   Vital Signs    Vitals:   06/23/20 0952 06/23/20 1130 06/23/20 2106 06/23/20 2319  BP: 100/64 102/67 126/77 131/87  Pulse: 77 78 (!) 116 (!) 110  Resp:  14 15 18   Temp:  98.1 F (36.7 C) 97.6 F (36.4 C) 97.8 F (36.6 C)  TempSrc:  Axillary Oral Oral  SpO2:  97% 99% 100%  Weight:    57.4 kg  Height:        Intake/Output Summary (Last 24 hours) at 06/24/2020 0753 Last data filed at 06/23/2020 2106 Gross per 24 hour  Intake -  Output 850 ml  Net -850 ml   Last 3 Weights 06/23/2020 06/23/2020 06/22/2020  Weight (lbs) 126 lb 8.7 oz 132 lb 0.9 oz 136 lb 7.4 oz  Weight (kg) 57.4 kg 59.9 kg 61.9 kg      Telemetry    Sinus to sinus tach with pacs and rare PVC- Personally Reviewed  Physical Exam   GEN: NAD Neck: supple Cardiac: RRR, no gallop Respiratory: CTA GI: Soft, NT/ND MS: Trace edema with chronic skin changes Neuro: Blind Psych: Normal affect   Labs    High Sensitivity Troponin:   Recent Labs  Lab 06/17/20 1607 07/08/2020 1115 07/11/2020 1401  TROPONINIHS 38* 42* 53*      Chemistry Recent Labs  Lab 07/13/2020 1121 06/19/20 0046 06/22/20 0120 06/23/20 0052  06/24/20 0042  NA 132*   < > 134* 134* 134*  K 3.2*   < > 3.7 4.4 4.8  CL 94*   < > 98 99 95*  CO2 22   < > 25 24 28   GLUCOSE 85   < > 87 90 82  BUN 15   < > 7* 7* 7*  CREATININE 1.95*   < > 0.76 0.71 0.75  CALCIUM 8.6*   < > 8.6* 8.8* 9.1  PROT 5.8*  --   --   --   --   ALBUMIN 3.4*  --   --   --   --   AST 35  --   --   --   --   ALT 16  --   --   --   --   ALKPHOS 136*  --   --   --   --   BILITOT 1.4*  --   --   --   --   GFRNONAA 26*   < > >60 >60 >60  ANIONGAP 16*   < > 11 11 11    < > = values in this  interval not displayed.     Hematology Recent Labs  Lab 06/19/20 0046 07/11/2020 0102 06/22/20 0120  WBC 20.5* 20.7* 14.6*  RBC 4.25 4.16 3.95  HGB 10.3* 10.0* 9.8*  HCT 35.8* 34.6* 32.6*  MCV 84.2 83.2 82.5  MCH 24.2* 24.0* 24.8*  MCHC 28.8* 28.9* 30.1  RDW 20.7* 20.5* 21.2*  PLT 523* 544* 490*    BNP Recent Labs  Lab 2020/07/11 1124  BNP 1,162.3*     Cardiac Studies   1. There is significant change since the study on 05/02/2018. LVEF has  decreased from 55-60% to 35-40% with diffuse hypokinesis. RV is newly  severely dilated with severe systolic dysfunction, severe tricuspid  regurgitation and moderate pulmonary  hypertension RVSP 48 mmHg. Large left pleural effusion.  2. Left ventricular ejection fraction, by estimation, is 35 to 40%. The  left ventricle has moderately decreased function. The left ventricle has  no regional wall motion abnormalities. There is mild concentric left  ventricular hypertrophy. Left  ventricular diastolic function could not be evaluated. There is the  interventricular septum is flattened in systole and diastole, consistent  with right ventricular pressure and volume overload.  3. Right ventricular systolic function is severely reduced. The right  ventricular size is severely enlarged. There is moderately elevated  pulmonary artery systolic pressure. The estimated right ventricular  systolic pressure is 28.3 mmHg.  4.  Right atrial size was moderately dilated.  5. Large pleural effusion in the left lateral region.  6. The mitral valve is normal in structure. Moderate mitral valve  regurgitation. No evidence of mitral stenosis.  7. Tricuspid valve regurgitation is severe.  8. The aortic valve is normal in structure. Aortic valve regurgitation is  not visualized. No aortic stenosis is present.  9. The inferior vena cava is dilated in size with <50% respiratory  variability, suggesting right atrial pressure of 15 mmHg.    1. No LAA thrombus. Successful DCCV after TEE with restoration of NSR.  2. Left ventricular ejection fraction, by estimation, is 45 to 50%. Left  ventricular ejection fraction by 3D volume is 47 %. The left ventricle has  mildly decreased function. The left ventricle demonstrates global  hypokinesis. There is the  interventricular septum is flattened in diastole ('D' shaped left  ventricle), consistent with right ventricular volume overload.  3. Severe TR is present. 2D PISA 0.9 cm. 2D ERO 0.50 cm2, R vol 37 cc. 3D  VCA 0.54 cm2, R vol 40 cc. RF ~51% consistent with severe TR, whic  accounts for low SV (SVI=22 cc/m2). TR is central and functional, likely  related to atrial flutter/fibrillation.  The tricuspid valve is abnormal. Tricuspid valve regurgitation is severe.  4. Right ventricular systolic function is severely reduced. The right  ventricular size is severely enlarged.  5. Left atrial size was mild to moderately dilated. No left atrial/left  atrial appendage thrombus was detected. The LAA emptying velocity was 63  cm/s.  6. Right atrial size was severely dilated.  7. A small pericardial effusion is present. The pericardial effusion is  surrounding the apex.  8. The mitral valve is grossly normal. Mild to moderate mitral valve  regurgitation. No evidence of mitral stenosis.  9. The aortic valve is grossly normal. Aortic valve regurgitation is not  visualized.  No aortic stenosis is present.  10. There is mild (Grade II) layered plaque involving the aortic root.  11. Evidence of atrial level shunting detected by color flow Doppler.  Agitated saline contrast bubble study  was positive with shunting observed  within 3-6 cardiac cycles suggestive of interatrial shunt.   Patient Profile     75 y.o. female admitted with atrial fibrillation with rapid ventricular response, hypertension, cardiomyopathy and CHF.  Assessment & Plan   1 paroxysmal atrial fibrillation/flutter-patient is in sinus rhythm this morning with runs of PAT.  We will continue Tikosyn at present dose.  Increase Toprol to 50 mg daily.  Continue apixaban.  Hopefully she will maintain sinus rhythm with Tikosyn therapy.  If not could consider referral for ablation.  2 cardiomyopathy-LV function decreased compared to previous.  This is felt possibly to be tachycardia mediated.  We will continue Toprol but increase dose as outlined above.  Will not add ARB at this point as blood pressure is borderline.  Plan to repeat echocardiogram in 3 to 6 months to see if LV function has improved.  3 acute combined systolic/diastolic congestive heart failure-she appears to be euvolemic today.  We will continue Lasix 20 mg daily and follow renal function.  4 pulmonary fibrosis-follow-up primary care after discharge.  Continue physical therapy.  Patient can be discharged from a cardiac standpoint.  We will arrange follow-up 2 to 4 weeks after discharge with APP.  Follow-up with me 3 months.  For questions or updates, please contact [place] Please consult [email or web address] for contact info under        Signed, [name], MD  06/24/2020, 7:53 AM

## 2020-06-24 NOTE — Progress Notes (Signed)
Patient Advocate Encounter  Was successful in obtaining a Good Rx copay card for dofetilide (Tikosyn).  This copay card will make the patients copay $24.54 (when taken 1-83 days) and $26.04 (when taken 84+ days).  The billing information is as follows and has been shared with the patients pharmacy.   RxBin: [id number] PCN: [id number] Member ID: [id number] Group ID: [id number]  Kerby Nora, PharmD, BCPS Heart Failure Stewardship Pharmacist Phone [phone]  Please check [email or web address] for unit-specific pharmacist phone numbers

## 2020-06-24 NOTE — Plan of Care (Signed)

## 2020-06-25 ENCOUNTER — Inpatient Hospital Stay (HOSPITAL_COMMUNITY): Payer: Medicare Other | Admitting: Anesthesiology

## 2020-06-25 ENCOUNTER — Other Ambulatory Visit (HOSPITAL_COMMUNITY): Payer: Self-pay | Admitting: Family Medicine

## 2020-06-25 ENCOUNTER — Inpatient Hospital Stay (HOSPITAL_COMMUNITY): Payer: Medicare Other

## 2020-06-25 DIAGNOSIS — I4901 Ventricular fibrillation: Secondary | ICD-10-CM

## 2020-06-25 DIAGNOSIS — I4891 Unspecified atrial fibrillation: Secondary | ICD-10-CM | POA: Diagnosis not present

## 2020-06-25 DIAGNOSIS — I480 Paroxysmal atrial fibrillation: Secondary | ICD-10-CM | POA: Diagnosis not present

## 2020-06-25 DIAGNOSIS — N179 Acute kidney failure, unspecified: Secondary | ICD-10-CM | POA: Diagnosis not present

## 2020-06-25 LAB — POCT I-STAT 7, (LYTES, BLD GAS, ICA,H+H)
Acid-base deficit: 5 mmol/L — ABNORMAL HIGH (ref 0.0–2.0)
Bicarbonate: 20.3 mmol/L (ref 20.0–28.0)
Calcium, Ion: 1.09 mmol/L — ABNORMAL LOW (ref 1.15–1.40)
HCT: 42 % (ref 36.0–46.0)
Hemoglobin: 14.3 g/dL (ref 12.0–15.0)
O2 Saturation: 99 %
Patient temperature: 98.6
Potassium: 5.1 mmol/L (ref 3.5–5.1)
Sodium: 126 mmol/L — ABNORMAL LOW (ref 135–145)
TCO2: 21 mmol/L — ABNORMAL LOW (ref 22–32)
pCO2 arterial: 36.2 mmHg (ref 32.0–48.0)
pH, Arterial: 7.356 (ref 7.350–7.450)
pO2, Arterial: 152 mmHg — ABNORMAL HIGH (ref 83.0–108.0)

## 2020-06-25 LAB — MAGNESIUM
Magnesium: 1.7 mg/dL (ref 1.7–2.4)
Magnesium: 2.7 mg/dL — ABNORMAL HIGH (ref 1.7–2.4)

## 2020-06-25 LAB — BASIC METABOLIC PANEL
Anion gap: 12 (ref 5–15)
Anion gap: 15 (ref 5–15)
BUN: <5 mg/dL — ABNORMAL LOW (ref 8–23)
BUN: <5 mg/dL — ABNORMAL LOW (ref 8–23)
CO2: 29 mmol/L (ref 22–32)
CO2: 23 mmol/L (ref 22–32)
Calcium: 9 mg/dL (ref 8.9–10.3)
Calcium: 8.9 mg/dL (ref 8.9–10.3)
Chloride: 92 mmol/L — ABNORMAL LOW (ref 98–111)
Chloride: 94 mmol/L — ABNORMAL LOW (ref 98–111)
Creatinine, Ser: 0.68 mg/dL (ref 0.44–1.00)
Creatinine, Ser: 0.8 mg/dL (ref 0.44–1.00)
GFR, Estimated: >60 mL/min (ref >60)
GFR, Estimated: >60 mL/min (ref >60)
Glucose, Bld: 91 mg/dL (ref 70–99)
Glucose, Bld: 86 mg/dL (ref 70–99)
Potassium: 3.2 mmol/L — ABNORMAL LOW (ref 3.5–5.1)
Potassium: 4.7 mmol/L (ref 3.5–5.1)
Sodium: 133 mmol/L — ABNORMAL LOW (ref 135–145)
Sodium: 132 mmol/L — ABNORMAL LOW (ref 135–145)

## 2020-06-25 MED ORDER — MAGNESIUM SULFATE 4 GM/100ML IV SOLN
4.0000 g | Freq: Once | INTRAVENOUS | Status: AC
  Administered 2020-06-25: 4 g via INTRAVENOUS
  Filled 2020-06-25: qty 100

## 2020-06-25 MED ORDER — SODIUM CHLORIDE 0.9 % IV SOLN: 250.0000 mL | INTRAVENOUS | Status: DC

## 2020-06-25 MED ORDER — METOPROLOL SUCCINATE ER 50 MG PO TB24
75.0000 mg | ORAL_TABLET | Freq: Every day | ORAL | Status: DC
  Administered 2020-06-25: 75 mg via ORAL
  Filled 2020-06-25: qty 1

## 2020-06-25 MED ORDER — APIXABAN 5 MG PO TABS: 5.0000 mg | ORAL_TABLET | Freq: Two times a day (BID) | ORAL | 0 refills | Status: DC

## 2020-06-25 MED ORDER — DOFETILIDE 250 MCG PO CAPS: 250.0000 ug | ORAL_CAPSULE | Freq: Two times a day (BID) | ORAL | 0 refills | Status: DC

## 2020-06-25 MED ORDER — MORPHINE 100MG IN NS 100ML (1MG/ML) PREMIX INFUSION
0.0000 mg/h | INTRAVENOUS | Status: DC
  Administered 2020-06-25: 5 mg/h via INTRAVENOUS
  Filled 2020-06-25: qty 100

## 2020-06-25 MED ORDER — PANTOPRAZOLE SODIUM 40 MG PO PACK: 40.0000 mg | PACK | Freq: Every day | ORAL | Status: DC

## 2020-06-25 MED ORDER — FENTANYL CITRATE (PF) 100 MCG/2ML IJ SOLN
25.0000 ug | INTRAMUSCULAR | Status: DC | PRN
  Administered 2020-06-25: 25 ug via INTRAVENOUS
  Filled 2020-06-25: qty 2

## 2020-06-25 MED ORDER — GABAPENTIN 300 MG PO CAPS: 600.0000 mg | ORAL_CAPSULE | Freq: Every day | ORAL | Status: DC

## 2020-06-25 MED ORDER — ACETAMINOPHEN 160 MG/5ML PO SOLN: 650.0000 mg | ORAL | Status: DC | PRN

## 2020-06-25 MED ORDER — LEVOTHYROXINE SODIUM 75 MCG PO TABS: 75.0000 ug | ORAL_TABLET | Freq: Every day | ORAL | Status: DC

## 2020-06-25 MED ORDER — CHLORHEXIDINE GLUCONATE CLOTH 2 % EX PADS: 6.0000 | MEDICATED_PAD | Freq: Every day | CUTANEOUS | Status: DC

## 2020-06-25 MED ORDER — LIDOCAINE IN D5W 4-5 MG/ML-% IV SOLN
1.0000 mg/min | INTRAVENOUS | Status: DC
  Administered 2020-06-25: 1 mg/min via INTRAVENOUS

## 2020-06-25 MED ORDER — NOREPINEPHRINE 4 MG/250ML-% IV SOLN
0.0000 ug/min | INTRAVENOUS | Status: DC
  Administered 2020-06-25: 40 ug/min via INTRAVENOUS
  Administered 2020-06-25: 45 ug/min via INTRAVENOUS
  Filled 2020-06-25: qty 250

## 2020-06-25 MED ORDER — MIDAZOLAM HCL 2 MG/2ML IJ SOLN: 1.0000 mg | INTRAMUSCULAR | Status: DC | PRN

## 2020-06-25 MED ORDER — CHLORHEXIDINE GLUCONATE 0.12% ORAL RINSE (MEDLINE KIT)
15.0000 mL | Freq: Two times a day (BID) | OROMUCOSAL | Status: DC
  Administered 2020-06-25: 15 mL via OROMUCOSAL

## 2020-06-25 MED ORDER — MIDAZOLAM HCL 2 MG/2ML IJ SOLN
1.0000 mg | INTRAMUSCULAR | Status: DC | PRN
Start: 2020-06-25 — End: 2020-06-26

## 2020-06-25 MED ORDER — APIXABAN 5 MG PO TABS: 5.0000 mg | ORAL_TABLET | Freq: Two times a day (BID) | ORAL | Status: DC

## 2020-06-25 MED ORDER — METOPROLOL SUCCINATE ER 25 MG PO TB24: 75.0000 mg | ORAL_TABLET | Freq: Every day | ORAL | 0 refills | Status: DC

## 2020-06-25 MED ORDER — ORAL CARE MOUTH RINSE: 15.0000 mL | OROMUCOSAL | Status: DC

## 2020-06-25 MED ORDER — LORAZEPAM 1 MG PO TABS: 1.0000 mg | ORAL_TABLET | Freq: Every day | ORAL | Status: DC

## 2020-06-25 MED ORDER — TRAMADOL HCL 50 MG PO TABS: 50.0000 mg | ORAL_TABLET | Freq: Two times a day (BID) | ORAL | Status: DC | PRN

## 2020-06-25 MED ORDER — DOCUSATE SODIUM 50 MG/5ML PO LIQD: 100.0000 mg | Freq: Two times a day (BID) | ORAL | Status: DC

## 2020-06-25 MED ORDER — MONTELUKAST SODIUM 10 MG PO TABS: 10.0000 mg | ORAL_TABLET | Freq: Every day | ORAL | Status: DC

## 2020-06-25 MED ORDER — ISOPROTERENOL HCL 0.2 MG/ML IJ SOLN: 2.0000 ug/min | INTRAVENOUS | Status: DC

## 2020-06-25 MED ORDER — OXYCODONE HCL 5 MG PO TABS: 5.0000 mg | ORAL_TABLET | Freq: Three times a day (TID) | ORAL | Status: DC | PRN

## 2020-06-25 MED ORDER — SUCCINYLCHOLINE CHLORIDE 20 MG/ML IJ SOLN
INTRAMUSCULAR | Status: DC | PRN
  Administered 2020-06-25: 80 mg via INTRAVENOUS

## 2020-06-25 MED ORDER — POTASSIUM CHLORIDE CRYS ER 20 MEQ PO TBCR
40.0000 meq | EXTENDED_RELEASE_TABLET | ORAL | Status: AC
  Administered 2020-06-25 (×2): 40 meq via ORAL
  Filled 2020-06-25 (×2): qty 2

## 2020-06-25 MED ORDER — EMPAGLIFLOZIN 10 MG PO TABS
10.0000 mg | ORAL_TABLET | Freq: Every day | ORAL | 0 refills | Status: DC
Start: 2020-06-26 — End: 2020-06-26

## 2020-06-25 MED ORDER — POLYETHYLENE GLYCOL 3350 17 G PO PACK: 17.0000 g | PACK | Freq: Every day | ORAL | Status: DC

## 2020-06-25 MED ORDER — ETOMIDATE 2 MG/ML IV SOLN
INTRAVENOUS | Status: DC | PRN
  Administered 2020-06-25: 12 mg via INTRAVENOUS

## 2020-06-25 MED ORDER — ALUM & MAG HYDROXIDE-SIMETH 200-200-20 MG/5ML PO SUSP: 30.0000 mL | Freq: Four times a day (QID) | ORAL | Status: DC | PRN

## 2020-06-25 MED ORDER — MAGNESIUM OXIDE 400 (241.3 MG) MG PO TABS: 400.0000 mg | ORAL_TABLET | Freq: Every day | ORAL | Status: DC

## 2020-06-25 MED ORDER — AMIODARONE HCL IN DEXTROSE 360-4.14 MG/200ML-% IV SOLN
INTRAVENOUS | Status: AC
  Filled 2020-06-25: qty 200

## 2020-06-25 MED ORDER — MORPHINE BOLUS VIA INFUSION
2.0000 mg | INTRAVENOUS | Status: DC | PRN
  Filled 2020-06-25: qty 2

## 2020-06-25 MED ORDER — FENTANYL CITRATE (PF) 100 MCG/2ML IJ SOLN
25.0000 ug | INTRAMUSCULAR | Status: DC | PRN
Start: 2020-06-25 — End: 2020-06-26

## 2020-06-26 MED FILL — Medication: Qty: 1 | Status: AC

## 2020-06-30 ENCOUNTER — Ambulatory Visit (HOSPITAL_COMMUNITY): Payer: Medicare Other | Admitting: Physician Assistant

## 2020-07-01 ENCOUNTER — Other Ambulatory Visit: Payer: Self-pay | Admitting: Internal Medicine

## 2020-07-15 NOTE — TOC Initial Note (Signed)
Transition of Care [facility]) - Initial/Assessment Note    Patient Details  Name: Crystal [name] MRN: [id number] Date of Birth: 03/30/1946  Transition [facility] CM/SW Contact:    Bethann Berkshire, [place] Phone Number: 07-19-2020, 12:44 PM  Clinical Narrative:                  CSW met with pt who is lethargic. She is agreeable to SNF work up and has no preference. CSW contacted pt's daughter who is also wanting SNF. Daughter states pt lives at home with her husband but that pt's husband uses a cane and unable to take care of her. Daughter has no preference on facility. Daughter states she is very available by phone or to stay with pt if there is a snow storm. CSW completed FL2 and faxed bed requests in the hub. Pt needs documents uploaded to [facility] for passr.   Expected Discharge Plan: Skilled Nursing Facility Barriers to Discharge: Continued Medical Work up   Patient Goals and CMS Choice Patient states their goals for this hospitalization and ongoing recovery are:: SNF      Expected Discharge Plan and Services Expected Discharge Plan: [place]         Expected Discharge Date: July 19, 2020                                    Prior Living Arrangements/Services     Patient language and need for interpreter reviewed:: Yes        Need for Family Participation in Patient Care: Yes (Comment) Care giver support system in place?: Yes (comment)   Criminal Activity/Legal Involvement Pertinent to Current Situation/Hospitalization: No - Comment as needed  Activities of Daily Living      Permission Sought/Granted   Permission granted to share information with : Yes, Verbal Permission Granted  Share Information with NAME: Daughter           Emotional Assessment Appearance:: Appears stated age Attitude/Demeanor/Rapport: Lethargic Affect (typically observed): Quiet Orientation: : Oriented to Self,Oriented to Place,Oriented to Situation Alcohol / Substance Use: Not  Applicable Psych Involvement: No (comment)  Admission diagnosis:  PAF (paroxysmal atrial fibrillation) (HCC) [I48.0] Atrial fibrillation with rapid ventricular response (HCC) [I48.91] AKI (acute kidney injury) ([place]) [N17.9] Atrial fibrillation with RVR ([place]) [I48.91] Leukocytosis, unspecified type [D72.829] Atrial fibrillation with controlled ventricular rate ([place]) [I48.91] Patient Active Problem List   Diagnosis Date Noted  . Atrial fibrillation with controlled ventricular rate ([place]) 06/19/2020  . Elevated troponin 06/14/2020  . Atrial fibrillation with rapid ventricular response ([place]) 07/02/2020  . AKI (acute kidney injury) ([place])   . Leukocytosis   . Diverticulitis 05/30/2020  . Pleural effusion 05/30/2020  . Leg laceration, left, initial encounter 02/13/2020  . Chronic respiratory failure with hypoxia ([place]) 01/29/2020  . ILD (interstitial lung disease) ([place]) 01/29/2020  . Chronic gout 09/21/2019  . Vertigo of central origin 02/09/2019  . Other peripheral vertigo, unspecified ear 02/09/2019  . Pruritus 01/02/2019  . Hypokalemia 08/09/2018  . Long term (current) use of anticoagulants 08/01/2018  . Closed compression fracture of L4 vertebra ([place]) 07/11/2018  . Bilateral leg edema 07/11/2018  . Lung nodule < 6cm on CT 07/11/2018  . Hypothyroidism (acquired) 04/11/2018  . Fracture of femoral neck, right ([place]) 03/22/2018  . PAF (paroxysmal atrial fibrillation) ([place]) 03/18/2018  . 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 Herpes zoster without complication 60/73/7106  .  Temporal arteritis ([place]) 07/18/2017  . Vision, loss, sudden, bilateral 07/18/2017  . Osteoarthritis 06/25/2016  . Anxiety 06/25/2016  . Back pain 07/18/2015  . 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 Carotid bruit present 06/05/2010  . Osteoporosis 06/03/2008  . History of colonic polyps 06/03/2008  . INTENTION TREMOR 12/06/2007  . IBS 10/16/2007  . Hyperlipidemia 06/06/2007  . Essential hypertension  06/06/2007  . Chronic obstructive airway disease with asthma ([place]) 02/23/2007   PCP:  [name], MD Pharmacy:   [facility] [address], [place] [facility] [address] [place] [address] Phone: [phone] Fax: [phone]  [place] [address] - [place], [place] - [place] [address] [place] [address] Phone: [phone] Fax: [place], [place] [address] Phone: [phone] Fax: [place], [place] - [address] [place] [address] Phone: [phone] Fax: [phone]     Social Determinants of Health (SDOH) Interventions    Readmission Risk Interventions No flowsheet data found.

## 2020-07-15 NOTE — Significant Event (Addendum)
Rapid Response Event Note   Reason for Call :  Increased frequency and duration of v-tach.  Pt was started on Tikosyn on 06/15/2020. She received Zofran this morning for nausea.   Initial Focused Assessment:  Pt lying in bed, awake. She follows commands, oriented to person. Skin is warm, dry, pink. Pt is legally blind and has been more lethargic today per primary RN.  She denies pain, chest discomfort, or nausea. She denies fluttering sensation in her chest. When asked generally, she states she doesn't feel well, but is unable to express to me as to why.   Pt noted to have frequent increasingly prolonged runs on v-tach on the monitor while I was assessing her.   VS: T 97.6F, BP 102/79, HR 103, RR 20, SpO2 93% on room air  Interventions:  -Repeat EKG completed -Cardiology notified: plan to give Lisuprel and lidocaine- pt received Magnesium and Potassium replacement earlier today.  -CMP ordered  Plan of Care:  -Prior to administration of Lisuprel & Lidocaine pt suffered from a VF arrest. Code blue initiated. ROSC. Pt transferred to cardiac ICU.  Event Summary:  MD Notified: Dr. Caryl Comes Call Time: 6553 Arrival Time: 7482 End Time: [place], RN

## 2020-07-15 NOTE — Progress Notes (Signed)
At 12:01 pm RN let Dr. Doristine Bosworth know that pt had a change in mental status and was not as talkative as she had been.   At 51 RN let Dr. Doristine Bosworth know that pt was confused and much less responsive than she had been previously this am and compared to yesterday. Dr. Doristine Bosworth responded at 1314 and said he had seen the pt and that she would need SNF placement, and that he was canceling the discharge.   Central Tele notified RN about 5-beat run of v-tach. Pt began having frequent runs of v-tach. EKG performed by tech. Rapid response was paged to put eyes on the pt. Kathyrn Drown, NP paged, came to bedside. RN awaiting orders.   Dr. Caryl Comes, MD at bedside and he spoke with pt about further treatment to control heart rate. [place], RN noticed pt heart rate 242 on monitor at nurse's station with rhythm of v-tach. [place] ran in room and alerted Dr. Caryl Comes who was outside of pt's room. Code blue was called. Pt had DNR bracelet on, pause in CPR efforts, code status verified from Epic as full code. Resuscitation efforts resumed. Pt had pulse found with doppler. Transferred to 2 Heart.

## 2020-07-15 NOTE — Progress Notes (Signed)
   Initially called by RN regarding increased frequency on telemetry. Pt was seen earlier in the day by cardiology with notations stating increased PAT but otherwise NSR. Her K+ was noted to be 3.2 therefore this was replaced with PO KDUR 41meq total. She was also given Mg supplementation at 4g IV. Rapid response also called to bedside. 12-Lead performed and reviewed with Dr. Caryl Comes who also reported some torsades type ectopy on telemetry. Shortly after bedside assessment with Dr. Caryl Comes and myself, pt was found to be in ventricular fibrillation and was unresponsive. Upon arrival back at the bedside, patient was wearing a DNR band on the left arm however code status in the computer, she was noted as a full code therefore code was called and the patient was defibrillated with ROSC after epinephrine and levophed along with amiodarone bolus, and lidocaine. Her Tikosyn and Toprol were stopped. Dr. Caryl Comes discussed with family who stated that she was to be a DNR with no plan for escalation at this time. Of note, she received Zofran around 0930am. She was transferred to CCU.   Kathyrn Drown NP-C [place] Pager: [phone]

## 2020-07-15 NOTE — Progress Notes (Signed)
Pharmacy: Dofetilide (Tikosyn) - Follow Up Assessment and Electrolyte Replacement  Pharmacy consulted to assist in monitoring and replacing electrolytes in this 75 y.o. female admitted on 06/21/2020 undergoing dofetilide initiation. First dofetilide dose: 250 mcg q12 hrs started 06/19/2020.  Labs:    Component Value Date/Time   K 3.2 (L) 2020-07-07 0101   MG 1.7 2020/07/07 0101     Plan: Potassium: K < 3.5:  Give KCl 40 mEq q4hr x2   Magnesium: Mg 1.3-1.7: Give Mg 4 gm IV x1  Crystal [name], PharmD, [place], [facility] Clinical Pharmacist [phone] Please check AMION for all [facility] Pharmacy numbers 2020/07/07

## 2020-07-15 NOTE — Progress Notes (Signed)
PROGRESS NOTE    Crystal [name]  YHC:[id number] DOB: [birth year] DOA: 07/06/2020 PCP: [name], MD   Brief Narrative:  Crystal [name] is a 75 y.o. female with medical history significant of afib, s/p cardioversion about 2 years ago, no longer on anticoagulation, IBS, IPF, hypertension, hyperlipidemia and peripheral vascular disease came to ED with generalized weakness and palpitations.  She talked to her PCP due to persistent symptoms who had advised her to come to the ED.  Patient denied any chest pain or shortness of breath.  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  Did not had A. fib since she had her cardioversion approximately 2 years ago.  On arrival to ED, she was found to be in A. fib/flutter with RVR, labs pertinent for leukocytosis of 22, sodium of 132, potassium of 3.2, creatinine of 1.95, troponin of 42>>53, lactic acid of 2.3, COVID-19 negative, chest x-ray with small bilateral pleural effusions, left greater than right, left base atelectasis/infiltrate.  She was admitted to hospital service.  Started on Cardizem drip as well as heparin drip.  Cardiology consulted.  Underwent DCCV cardioversion on 07-15-2020 and started on Tikosyn afterwards.  Assessment & Plan:   Active Problems:   Atrial fibrillation with rapid ventricular response (HCC)   Atrial fibrillation with controlled ventricular rate (HCC)   A. fib with RVR: Reportedly, patient had successful DCCV cardioversion on Jul 15, 2020 and remained in sinus rhythm until the morning of 06/21/2020. She was started on Tikosyn. She has been in and out of normal sinus rhythm.  Cardiology has cleared her for discharge and the plan was to discharge her as well but to make sure she is not strong enough, I consulted PT OT and then an hour later, according to the report from nursing staff she became slightly confused and nauseous and did not work well with the PT and they recommended SNF.  I reassessed her again at 1:10  PM.  Her husband was at the bedside.  Patient was looking very tired and worn off, slow in response but oriented.  Denied having any complaint.  Canceling discharge today.  TOC aware and will look for SNF.  Acute on chronic hypoxic respiratory failure: Patient was recently discharged home with as needed oxygen of 2 L. Currently down to 2 L requirement. Chest x-ray shows atelectasis. I once again encouraged incentive spirometry.  Acute systolic congestive heart failure: Appears euvolemic.  She was on IV Lasix which was transitioned to oral Lasix 20 mg on 06/23/2020.  Continue that.  Elevated troponin: Slightly elevated but flat troponin without having chest pain or shortness of breath does not indicate ACS and instead possible demand ischemia.  Echo with normal ejection fraction and no wall motion abnormality..  Chronic leukocytosis: Chest x-ray with concern of left base infiltrate, patient afebrile and no upper respiratory symptoms so doubt pneumonia.  Minimally elevated procalcitonin.  Watch off of antibiotics.  She has chronic leukocytosis that is a stable.  Lactic acidosis.  Most likely secondary to hypoxia with A. fib with  RVR.resolved.  AKI. Resolved.  Hypokalemia.  3.2.  Repleted.  Pleural effusion.  Secondary to acute congestive heart failure.  Management as above.  History of IBS and chronic intermittent diarrhea.  Patient do get flares, no acute concern.  Most recent flare was approximately 2 weeks ago. She is having intermittent diarrhea. Requesting Imodium. We will do that.  Acute hyponatremia: Improved and close to being normal.  DVT prophylaxis: Eliquis  Code Status: Full Code  Family Communication:  husband present at bedside.  Plan of care discussed with patient in length and he verbalized understanding and agreed with it.  Status is: Inpatient  Remains inpatient appropriate because:Ongoing diagnostic testing needed not appropriate for outpatient work up   Dispo:  The patient is from: Home              Anticipated d/c is to: Home              Anticipated d/c date is: 1 day              Patient currently is not medically stable to d/c.             Estimated body mass index is 22.26 kg/m as calculated from the following:   Height as of this encounter: 5\' 3"  (1.6 m).   Weight as of this encounter: 57 kg.      Nutritional status:               Consultants:   Cardiology  Procedures:   None  Antimicrobials:  Anti-infectives (From admission, onward)   None         Subjective: Patient seen and examined twice.  Earlier I saw her around 9 AM.  At that time, she was completely alert, oriented and answering questions appropriately.  Denied having any complaint.  Per nursing reports, patient then became slightly confused and sick of stomach while working with PT.  I reassessed her personally at the bedside at 1:10 PM.  Her husband was at the bedside.  Patient was awake, alert and oriented but slow in response.  There was no encephalopathy and she was not confused.  However she looked significantly weak and tired.  Objective: Vitals:   06/24/20 2317 06/17/2020 0358 06/15/2020 0515 06/28/2020 0713  BP: 100/68 114/88  130/74  Pulse: 89 (!) 111  (!) 103  Resp: 15 18  20   Temp: 97.8 F (36.6 C) 98.9 F (37.2 C)  99.9 F (37.7 C)  TempSrc: Oral Oral  Oral  SpO2: 99% 96%  98%  Weight:   57 kg   Height:        Intake/Output Summary (Last 24 hours) at 07/01/2020 1314 Last data filed at 06/16/2020 0403 Gross per 24 hour  Intake 170 ml  Output 1000 ml  Net -830 ml   Filed Weights   06/23/20 0520 06/23/20 2319 06/24/2020 0515  Weight: 59.9 kg 57.4 kg 57 kg    Examination:  General exam: Appears calm and comfortable  Respiratory system: Diminished breath sounds at the bases bilaterally. Respiratory effort normal. Cardiovascular system: S1 & S2 heard, RRR. No JVD, murmurs, rubs, gallops or clicks. No pedal  edema. Gastrointestinal system: Abdomen is nondistended, soft and nontender. No organomegaly or masses felt. Normal bowel sounds heard. Central nervous system: Alert and oriented. No focal neurological deficits. Extremities: Symmetric 5 x 5 power. Skin: No rashes, lesions or ulcers.  Psychiatry: Judgement and insight appear normal. Mood & affect appropriate.    Data Reviewed: I have personally reviewed following labs and imaging studies  CBC: Recent Labs  Lab 06/19/20 0046 July 14, 2020 0102 06/22/20 0120  WBC 20.5* 20.7* 14.6*  NEUTROABS  --   --  10.4*  HGB 10.3* 10.0* 9.8*  HCT 35.8* 34.6* 32.6*  MCV 84.2 83.2 82.5  PLT 523* 544* 270*   Basic Metabolic Panel: Recent Labs  Lab 06/21/20 0104 06/22/20 0120 06/23/20 0052 06/24/20 0042 06/24/20 0654 06/30/2020  0101  NA 134* 134* 134* 134*  --  133*  K 4.5 3.7 4.4 4.8  --  3.2*  CL 101 98 99 95*  --  92*  CO2 23 25 24 28   --  29  GLUCOSE 90 87 90 82  --  91  BUN 11 7* 7* 7*  --  <5*  CREATININE 0.96 0.76 0.71 0.75  --  0.68  CALCIUM 8.4* 8.6* 8.8* 9.1  --  9.0  MG 2.1 1.5* 1.9  --  1.8 1.7   GFR: Estimated Creatinine Clearance: 49.5 mL/min (by C-G formula based on SCr of 0.68 mg/dL). Liver Function Tests: No results for input(s): AST, ALT, ALKPHOS, BILITOT, PROT, ALBUMIN in the last 168 hours. No results for input(s): LIPASE, AMYLASE in the last 168 hours. No results for input(s): AMMONIA in the last 168 hours. Coagulation Profile: Recent Labs  Lab 06/24/2020 0102  INR 1.2   Cardiac Enzymes: No results for input(s): CKTOTAL, CKMB, CKMBINDEX, TROPONINI in the last 168 hours. BNP (last 3 results) No results for input(s): PROBNP in the last 8760 hours. HbA1C: No results for input(s): HGBA1C in the last 72 hours. CBG: No results for input(s): GLUCAP in the last 168 hours. Lipid Profile: No results for input(s): CHOL, HDL, LDLCALC, TRIG, CHOLHDL, LDLDIRECT in the last 72 hours. Thyroid Function Tests: No results for  input(s): TSH, T4TOTAL, FREET4, T3FREE, THYROIDAB in the last 72 hours. Anemia Panel: No results for input(s): VITAMINB12, FOLATE, FERRITIN, TIBC, IRON, RETICCTPCT in the last 72 hours. Sepsis Labs: Recent Labs  Lab 06/19/20 1257 06/19/20 1620 06/21/20 1119 06/21/20 1431  LATICACIDVEN 2.3* 3.0* 1.9 1.5    Recent Results (from the past 240 hour(s))  Culture, blood (routine x 2)     Status: None   Collection Time: 06/20/2020  2:01 PM   Specimen: BLOOD  Result Value Ref Range Status   Specimen Description BLOOD LEFT ANTECUBITAL  Final   Special Requests   Final    BOTTLES DRAWN AEROBIC AND ANAEROBIC Blood Culture adequate volume   Culture   Final    NO GROWTH 5 DAYS Performed at [facility], [address], [place], [place] [address]    Report Status 06/23/2020 FINAL  Final  Resp Panel by RT-PCR (Flu A&B, Covid) Nasopharyngeal Swab     Status: None   Collection Time: 07/12/2020  6:28 PM   Specimen: Nasopharyngeal Swab; Nasopharyngeal(NP) swabs in vial transport medium  Result Value Ref Range Status   SARS Coronavirus 2 by RT PCR NEGATIVE NEGATIVE Final    Comment: (NOTE) SARS-CoV-2 target nucleic acids are NOT DETECTED.  The SARS-CoV-2 RNA is generally detectable in upper respiratory specimens during the acute phase of infection. The lowest concentration of SARS-CoV-2 viral copies this assay can detect is 138 copies/mL. A negative result does not preclude SARS-Cov-2 infection and should not be used as the sole basis for treatment or other patient management decisions. A negative result may occur with  improper specimen collection/handling, submission of specimen other than nasopharyngeal swab, presence of viral mutation(s) within the areas targeted by this assay, and inadequate number of viral copies(<138 copies/mL). A negative result must be combined with clinical observations, patient history, and epidemiological information. The expected result is Negative.  Fact  Sheet for Patients:  EntrepreneurPulse.com.au  Fact Sheet for Healthcare Providers:  IncredibleEmployment.be  This test is no t yet approved or cleared by the [place] FDA and  has been authorized for detection and/or diagnosis of  SARS-CoV-2 by FDA under an Emergency Use Authorization (EUA). This EUA will remain  in effect (meaning this test can be used) for the duration of the COVID-19 declaration under Section 564(b)(1) of the Act, 21 U.S.C.section 360bbb-3(b)(1), unless the authorization is terminated  or revoked sooner.       Influenza A by PCR NEGATIVE NEGATIVE Final   Influenza B by PCR NEGATIVE NEGATIVE Final    Comment: (NOTE) The Xpert Xpress SARS-CoV-2/FLU/RSV plus assay is intended as an aid in the diagnosis of influenza from Nasopharyngeal swab specimens and should not be used as a sole basis for treatment. Nasal washings and aspirates are unacceptable for Xpert Xpress SARS-CoV-2/FLU/RSV testing.  Fact Sheet for Patients: EntrepreneurPulse.com.au  Fact Sheet for Healthcare Providers: IncredibleEmployment.be  This test is not yet approved or cleared by the [place] FDA and has been authorized for detection and/or diagnosis of SARS-CoV-2 by FDA under an Emergency Use Authorization (EUA). This EUA will remain in effect (meaning this test can be used) for the duration of the COVID-19 declaration under Section 564(b)(1) of the Act, 21 U.S.C. section 360bbb-3(b)(1), unless the authorization is terminated or revoked.  Performed at [facility], [place] [address], [place], [place] [address]   Culture, blood (routine x 2)     Status: None   Collection Time: 06/22/2020  7:44 PM   Specimen: BLOOD  Result Value Ref Range Status   Specimen Description BLOOD LEFT ANTECUBITAL  Final   Special Requests   Final    BOTTLES DRAWN AEROBIC AND ANAEROBIC Blood Culture adequate volume   Culture    Final    NO GROWTH 5 DAYS Performed at [facility], [place] [address], [place], [place] [address]    Report Status 06/23/2020 FINAL  Final  MRSA PCR Screening     Status: None   Collection Time: 07/03/2020 10:00 PM   Specimen: Nasal Mucosa; Nasopharyngeal  Result Value Ref Range Status   MRSA by PCR NEGATIVE NEGATIVE Final    Comment:        The GeneXpert MRSA Assay (FDA approved for NASAL specimens only), is one component of a comprehensive MRSA colonization surveillance program. It is not intended to diagnose MRSA infection nor to guide or monitor treatment for MRSA infections. Performed at [facility], [place] [address], [place], [place] [address]       Radiology Studies: No results found.  Scheduled Meds: . apixaban  5 mg Oral BID  . dofetilide  250 mcg Oral BID  . empagliflozin  10 mg Oral Daily  . gabapentin  600 mg Oral QHS  . levothyroxine  75 mcg Oral Q0600  . LORazepam  1 mg Oral QHS  . magnesium oxide  400 mg Oral Daily  . metoprolol succinate  75 mg Oral Daily  . montelukast  10 mg Oral QHS  . potassium chloride  40 mEq Oral Q4H  . sodium chloride flush  3 mL Intravenous Q12H  . theophylline  200 mg Oral BID   Continuous Infusions: . sodium chloride    . lactated ringers       LOS: 6 days   Time spent: 32 minutes   Darliss Cheney, MD [facility]  06/26/20, 1:14 PM   To contact the attending provider between 7A-7P or the covering provider during after hours 7P-7A, please log into the web site [email or web address].

## 2020-07-15 NOTE — Progress Notes (Signed)
OT Cancellation Note  Patient Details Name: Crystal [name] MRN: [id number] DOB: 1946-03-15   Cancelled Treatment:    Reason Eval/Treat Not Completed: Patient not medically ready (Pt coding at 5:30p. OT to follow for evaluate once medically necessary,)   Jefferey Pica, OTR/L Acute Rehabilitation Services Pager: [phone] Office: [phone]  Wylie Russon C 06/29/2020, 5:33 PM

## 2020-07-15 NOTE — Procedures (Signed)
Extubation Procedure Note  Patient Details:   Name: Crystal [name] DOB: 28-Aug-1945 MRN: [id number]   Airway Documentation:  Airway 7.5 mm (Active)  Secured at (cm) 21 cm 06/18/2020 1811  Measured From Lips 06/14/2020 1811  Secured Location Right 07/05/2020 1730  Secured By [employer] 07/09/2020 1730  Prone position No 06/15/2020 1730  Site Condition Dry 06/18/2020 1730   Vent end date: 07/12/2020 Vent end time: 2048   Evaluation  O2 sats: currently acceptable Complications: No apparent complications Patient did not tolerate procedure well. Bilateral Breath Sounds: Clear,Diminished   No  Pt terminally extubated per order with family at bedside.  Wyman Songster [facility] 07/01/2020, 8:49 PM

## 2020-07-15 NOTE — Progress Notes (Signed)
Chaplain responded to a code blue for this patient on 2C.  Chaplain called family to come back.  Patient was moved to [facility].  Chaplain met spouse, Eduard Clos to escort him and connected MD to share events.  Chaplain provided ministry of presence, empathetic/reflective listening and words of comfort.  Patient has told spouse before she does not want to live like this.  They lost their son about 7 years ago.  Other son lives locally and is supportive.  Chaplain went bedside with spouse.  Chaplain available for additional support as need.  Cameron, Mdiv.    06-26-2020 1813  Clinical Encounter Type  Visited With Patient;Family;Health care provider  Visit Type Critical Care;Code  Referral From Nurse  Consult/Referral To Chaplain  Spiritual Encounters  Spiritual Needs Emotional  Stress Factors  Family Stress Factors Loss

## 2020-07-15 NOTE — Evaluation (Signed)
Physical Therapy Evaluation Patient Details Name: Crystal [name] MRN: [id number] DOB: 1945-06-17 Today's Date: 07/02/2020   History of Present Illness  Pt is a 75 y/o female admitted secondary to chest pain and palpitations. Found to be in a fib with RVR. Pt is s/p TEE and cardioversion. Pt also with acute CHF. PMH includes blindness, HTN, PVD, COPD, and a fib s/p cardioversion.  Clinical Impression  Pt admitted secondary to problem above with deficits below. Pt presenting with very slow processing and disoriented to time. Not following commands to assist with bed mobility tasks this session. Requiring total A to sit at EOB. Upon sitting, pt reporting increased nausea and stated she wanted to lay back down. HR elevating to 133. Notified RN about cognitive status. Feel at this time pt would benefit from SNF level therapies, however, if mobility improves, may be able to d/c home with HHPT and 24/7 assist. Will continue to follow acutely.     Follow Up Recommendations SNF;Supervision/Assistance - 24 hour (vs HHPT pending progression)    Equipment Recommendations  Wheelchair (measurements PT);Wheelchair cushion (measurements PT)    Recommendations for Other Services       Precautions / Restrictions Precautions Precautions: Other (comment);Fall Precaution Comments: blind Restrictions Weight Bearing Restrictions: No      Mobility  Bed Mobility Overal bed mobility: Needs Assistance Bed Mobility: Supine to Sit;Sit to Supine     Supine to sit: Total assist Sit to supine: Total assist   General bed mobility comments: Total A for sequencing and LE and trunk management. Pt not following cues to perform bed mobility tasks. Reporting pain in neck and RLE. Pt able to sit at EOB with supervision, but laying back down almost immediately as she said she was nauseous.    Transfers                    Ambulation/Gait                Stairs            Wheelchair Mobility     Modified Rankin (Stroke Patients Only)       Balance Overall balance assessment: Needs assistance Sitting-balance support: No upper extremity supported;Feet supported Sitting balance-Leahy Scale: Fair Sitting balance - Comments: close supervision for safety                                     Pertinent Vitals/Pain Pain Assessment: Faces Faces Pain Scale: Hurts even more Pain Location: RLE and neck Pain Descriptors / Indicators: Grimacing;Guarding Pain Intervention(s): Limited activity within patient's tolerance;Monitored during session;Repositioned    Home Living Family/patient expects to be discharged to:: Private residence Living Arrangements: Spouse/significant other Available Help at Discharge: Family;Available 24 hours/day Type of Home: House Home Access: Stairs to enter Entrance Stairs-Rails: None Entrance Stairs-Number of Steps: 3-4 Home Layout: One level Home Equipment: Clinical cytogeneticist - 2 wheels;Bedside commode      Prior Function Level of Independence: Independent with assistive device(s)         Comments: Uses RW for ambulation     Hand Dominance        Extremity/Trunk Assessment   Upper Extremity Assessment Upper Extremity Assessment: Defer to OT evaluation    Lower Extremity Assessment Lower Extremity Assessment: Generalized weakness;RLE deficits/detail RLE Deficits / Details: Pt grimacing with RLE movement.    Cervical / Trunk Assessment Cervical / Trunk Assessment: Other  exceptions;Kyphotic Cervical / Trunk Exceptions: Pt reporting neck pain  Communication   Communication: Expressive difficulties (mumbled speech)  Cognition Arousal/Alertness: Awake/alert Behavior During Therapy: Flat affect Overall Cognitive Status: No family/caregiver present to determine baseline cognitive functioning                                 General Comments: Very slow processing. Requiring extended time to respond to  questions. Pt reporting it was March. Pt did not follow commands to assist with bed mobility.      General Comments      Exercises     Assessment/Plan    PT Assessment Patient needs continued PT services  PT Problem List Decreased strength;Decreased balance;Decreased mobility;Decreased cognition;Decreased knowledge of use of DME;Decreased safety awareness;Decreased knowledge of precautions       PT Treatment Interventions DME instruction;Gait training;Functional mobility training;Therapeutic exercise;Therapeutic activities;Balance training;Patient/family education;Cognitive remediation;Stair training    PT Goals (Current goals can be found in the Care Plan section)  Acute Rehab PT Goals PT Goal Formulation: Patient unable to participate in goal setting Time For Goal Achievement: 07/09/20 Potential to Achieve Goals: Fair    Frequency Min 3X/week   Barriers to discharge        Co-evaluation               AM-PAC PT "6 Clicks" Mobility  Outcome Measure Help needed turning from your back to your side while in a flat bed without using bedrails?: Total Help needed moving from lying on your back to sitting on the side of a flat bed without using bedrails?: Total Help needed moving to and from a bed to a chair (including a wheelchair)?: Total Help needed standing up from a chair using your arms (e.g., wheelchair or bedside chair)?: Total Help needed to walk in hospital room?: Total Help needed climbing 3-5 steps with a railing? : Total 6 Click Score: 6    End of Session Equipment Utilized During Treatment: Oxygen Activity Tolerance: Patient limited by lethargy;Patient limited by fatigue;Treatment limited secondary to medical complications (Comment) (nausea) Patient left: in bed;with call bell/phone within reach;with nursing/sitter in room Nurse Communication: Mobility status;Other (comment) (pt's mental status change) PT Visit Diagnosis: Muscle weakness (generalized)  (M62.81);Difficulty in walking, not elsewhere classified (R26.2)    Time: 2458-0998 PT Time Calculation (min) (ACUTE ONLY): 21 min   Charges:   PT Evaluation $PT Eval Moderate Complexity: 1 Mod          [name], PT, DPT  Acute Rehabilitation Services  Pager: [phone] Office: [phone]   [name] 07/14/2020, 9:57 AM

## 2020-07-15 NOTE — Progress Notes (Signed)
Progress Note  Patient Name: Crystal [name] Date of Encounter: 10-Jul-2020  [facility] Cardiologist: [name], MD   Subjective   No CP or dyspnea; mild nausea  Inpatient Medications    Scheduled Meds: . apixaban  5 mg Oral BID  . dofetilide  250 mcg Oral BID  . empagliflozin  10 mg Oral Daily  . furosemide  20 mg Oral Daily  . gabapentin  600 mg Oral QHS  . levothyroxine  75 mcg Oral Q0600  . LORazepam  1 mg Oral QHS  . magnesium oxide  400 mg Oral Daily  . metoprolol tartrate  25 mg Oral Q6H  . montelukast  10 mg Oral QHS  . potassium chloride  40 mEq Oral Q4H  . sodium chloride flush  3 mL Intravenous Q12H  . theophylline  200 mg Oral BID   Continuous Infusions: . sodium chloride    . lactated ringers    . magnesium sulfate bolus IVPB     PRN Meds: sodium chloride, acetaminophen, alum & mag hydroxide-simeth, loperamide, ondansetron (ZOFRAN) IV, oxyCODONE, sodium chloride flush, traMADol   Vital Signs    Vitals:   06/24/20 2317 10-Jul-2020 0358 Jul 10, 2020 0515 07/10/2020 0713  BP: 100/68 114/88  130/74  Pulse: 89 (!) 111  (!) 103  Resp: 15 18  20   Temp: 97.8 F (36.6 C) 98.9 F (37.2 C)  99.9 F (37.7 C)  TempSrc: Oral Oral  Oral  SpO2: 99% 96%  98%  Weight:   57 kg   Height:        Intake/Output Summary (Last 24 hours) at 07-10-2020 0742 Last data filed at 10-Jul-2020 0403 Gross per 24 hour  Intake 170 ml  Output 1500 ml  Net -1330 ml   Last 3 Weights 07/10/20 06/23/2020 06/23/2020  Weight (lbs) 125 lb 10.6 oz 126 lb 8.7 oz 132 lb 0.9 oz  Weight (kg) 57 kg 57.4 kg 59.9 kg      Telemetry    Sinus to sinus tach with pacs and PAT- Personally Reviewed  Physical Exam   GEN: NAD, WD Neck: supple, no JVD Cardiac: irregular Respiratory: CTA; no wheeze GI: Soft, NT/ND, no masses MS: Trace edema with chronic skin changes Neuro: Blind; moves all ext Psych: Normal affect   Labs    High Sensitivity Troponin:   Recent Labs  Lab 06/17/20 1607  07/03/2020 1115 07/12/2020 1401  TROPONINIHS 38* 42* 53*      Chemistry Recent Labs  Lab 07/13/2020 1121 06/19/20 0046 06/23/20 0052 06/24/20 0042 07/10/20 0101  NA 132*   < > 134* 134* 133*  K 3.2*   < > 4.4 4.8 3.2*  CL 94*   < > 99 95* 92*  CO2 22   < > 24 28 29   GLUCOSE 85   < > 90 82 91  BUN 15   < > 7* 7* <5*  CREATININE 1.95*   < > 0.71 0.75 0.68  CALCIUM 8.6*   < > 8.8* 9.1 9.0  PROT 5.8*  --   --   --   --   ALBUMIN 3.4*  --   --   --   --   AST 35  --   --   --   --   ALT 16  --   --   --   --   ALKPHOS 136*  --   --   --   --   BILITOT 1.4*  --   --   --   --  GFRNONAA 26*   < > >60 >60 >60  ANIONGAP 16*   < > 11 11 12    < > = values in this interval not displayed.     Hematology Recent Labs  Lab 06/19/20 0046 06/15/2020 0102 06/22/20 0120  WBC 20.5* 20.7* 14.6*  RBC 4.25 4.16 3.95  HGB 10.3* 10.0* 9.8*  HCT 35.8* 34.6* 32.6*  MCV 84.2 83.2 82.5  MCH 24.2* 24.0* 24.8*  MCHC 28.8* 28.9* 30.1  RDW 20.7* 20.5* 21.2*  PLT 523* 544* 490*    BNP Recent Labs  Lab 06/27/2020 1124  BNP 1,162.3*     Cardiac Studies   1. There is significant change since the study on 05/02/2018. LVEF has  decreased from 55-60% to 35-40% with diffuse hypokinesis. RV is newly  severely dilated with severe systolic dysfunction, severe tricuspid  regurgitation and moderate pulmonary  hypertension RVSP 48 mmHg. Large left pleural effusion.  2. Left ventricular ejection fraction, by estimation, is 35 to 40%. The  left ventricle has moderately decreased function. The left ventricle has  no regional wall motion abnormalities. There is mild concentric left  ventricular hypertrophy. Left  ventricular diastolic function could not be evaluated. There is the  interventricular septum is flattened in systole and diastole, consistent  with right ventricular pressure and volume overload.  3. Right ventricular systolic function is severely reduced. The right  ventricular size is  severely enlarged. There is moderately elevated  pulmonary artery systolic pressure. The estimated right ventricular  systolic pressure is 76.2 mmHg.  4. Right atrial size was moderately dilated.  5. Large pleural effusion in the left lateral region.  6. The mitral valve is normal in structure. Moderate mitral valve  regurgitation. No evidence of mitral stenosis.  7. Tricuspid valve regurgitation is severe.  8. The aortic valve is normal in structure. Aortic valve regurgitation is  not visualized. No aortic stenosis is present.  9. The inferior vena cava is dilated in size with <50% respiratory  variability, suggesting right atrial pressure of 15 mmHg.    1. No LAA thrombus. Successful DCCV after TEE with restoration of NSR.  2. Left ventricular ejection fraction, by estimation, is 45 to 50%. Left  ventricular ejection fraction by 3D volume is 47 %. The left ventricle has  mildly decreased function. The left ventricle demonstrates global  hypokinesis. There is the  interventricular septum is flattened in diastole ('D' shaped left  ventricle), consistent with right ventricular volume overload.  3. Severe TR is present. 2D PISA 0.9 cm. 2D ERO 0.50 cm2, R vol 37 cc. 3D  VCA 0.54 cm2, R vol 40 cc. RF ~51% consistent with severe TR, whic  accounts for low SV (SVI=22 cc/m2). TR is central and functional, likely  related to atrial flutter/fibrillation.  The tricuspid valve is abnormal. Tricuspid valve regurgitation is severe.  4. Right ventricular systolic function is severely reduced. The right  ventricular size is severely enlarged.  5. Left atrial size was mild to moderately dilated. No left atrial/left  atrial appendage thrombus was detected. The LAA emptying velocity was 63  cm/s.  6. Right atrial size was severely dilated.  7. A small pericardial effusion is present. The pericardial effusion is  surrounding the apex.  8. The mitral valve is grossly normal. Mild to  moderate mitral valve  regurgitation. No evidence of mitral stenosis.  9. The aortic valve is grossly normal. Aortic valve regurgitation is not  visualized. No aortic stenosis is present.  10. There is  mild (Grade II) layered plaque involving the aortic root.  11. Evidence of atrial level shunting detected by color flow Doppler.  Agitated saline contrast bubble study was positive with shunting observed  within 3-6 cardiac cycles suggestive of interatrial shunt.   Patient Profile     75 y.o. female admitted with atrial fibrillation with rapid ventricular response, hypertension, cardiomyopathy and CHF.  Assessment & Plan   1 paroxysmal atrial fibrillation/flutter-patient is in sinus rhythm this morning with runs of PAT. She has not had recurrent atrial fibrillation on my review of telemetry.  We will continue Tikosyn at present dose.  Discontinue metoprolol and treat with Toprol 75 mg daily.  Continue apixaban.  Hopefully she will maintain sinus rhythm with Tikosyn therapy.  If not could consider referral for ablation.  2 cardiomyopathy-LV function decreased compared to previous.  This is felt possibly to be tachycardia mediated.  We will continue beta blocker; change to toprol 75 mg daily.  Will not add ARB at this point as blood pressure is borderline; will consider as outpt if BP allows.  Plan to repeat echocardiogram in 3 to 6 months to see if LV function has improved.  3 acute combined systolic/diastolic congestive heart failure-she appears to be euvolemic today.  Will DC lasix.  4 pulmonary fibrosis-follow-up primary care after discharge.  5 hypokalemia-supplement  Ambulate today.  If stable discharge this p.m.  We will arrange follow-up in our office with APP in 1 week with bmet at that time.  Follow-up with me in 3 months.  For questions or updates, please contact [place] Please consult [email or web address] for contact info under        Signed, [name], MD  07/01/2020,  7:42 AM

## 2020-07-15 NOTE — Progress Notes (Signed)
Heart Failure Stewardship Pharmacist Progress Note   PCP: [name], MD PCP-Cardiologist: [name], MD    HPI:  75 yo F with PMH of afib (self-discontinued PTA Eliquis), HLD, HTN, and PVD. She presented to the ED with weakness and palpitations. She was found to be in aflutter with RVR. An ECHO was done on 06/19/20 and LVEF is reduced to 35-40% (was 55-60% in 04/2018). S/p DCCV on 06/23/2020 (TEE with LVEF 47%) and has been started on Tikosyn.   Current HF Medications: Metoprolol XL 50 mg daily Jardiance 10 mg daily  Prior to admission HF Medications: Furosemide 40 mg daily  Pertinent Lab Values: . Serum creatinine 0.68, BUN <5, Potassium 3.2, Sodium 133, BNP 1162, Magnesium 1.7  Vital Signs: . Weight: 125 lbs (admission weight: 145 lbs) . Blood pressure: 100-130/60-80s . Heart rate: 80-120s  Medication Assistance / Insurance Benefits Check: Does the patient have prescription insurance?  Yes Type of insurance plan: [place] [employer]   Outpatient Pharmacy:  [facility] to admission [facility] Is the patient willing to use [facility] at discharge? Yes   Assessment: 1. Acute systolic CHF (EF 16-07%), likely due to aflutter RVR. NYHA class II symptoms. S/p DCCV on 07/07/2020. - Stopped furosemide today due to no evidence of fluid overload from cardiology exam - Agree with increasing metoprolol XL to 75 mg daily - No ACE/ARB/ARNI with low BP, may be able to start Entresto if BP stabilizes - Continue Jardiance 10 mg daily ([place]ade is not covered on her insurance). No drug interactions noted with Tikosyn.   Plan: 1) Medication changes recommended at this time: - Agree with increasing metoprolol as noted above  2) Patient assistance application(s): - None pending - Wilder Glade is not covered on her insurance; prefers [facility] (copay $37 per month)  3) Education - Patient has been educated on current HF medications (metoprolol, Jardiance) and potential  additions to HF medication regimen (ARB/Entresto) - Patient verbalizes understanding that over the next few months, these medication doses may change and more medications may be added to optimize HF regimen - Patient has been educated on basic disease state pathophysiology and goals of therapy - Time spent (30 mins)  Kerby Nora, PharmD, BCPS Heart Failure Stewardship Pharmacist Phone [phone]

## 2020-07-15 NOTE — Progress Notes (Signed)
Patient had x-ray chest done which shows right-sided pneumothorax. Spoke with patient's husband who is healthcare proxy along with the his son and few other family members at bedside, they decided not to pursue chest tube, they decided to start comfort care only with palliative extubation. Will stop all lab draws, vasopressors, lidocaine infusion and other medications Patient has already DNR order written. Will start morphine infusion once that is a started, will stop vasopressors and do palliative extubation while family is at bedside.    Jacky Kindle MD [facility] Pager: [phone] Mobile: [phone]

## 2020-07-15 NOTE — Progress Notes (Signed)
       RE:  Crystal [name]       Date of Birth:  1946/01/28     Date:    July 18, 2020      To Whom It May Concern:  Please be advised that the above-named patient will require a short-term nursing home stay - anticipated 30 days or less for rehabilitation and strengthening.  The plan is for return home.

## 2020-07-15 NOTE — Anesthesia Procedure Notes (Signed)
Procedure Name: Intubation Date/Time: 07-24-2020 5:16 PM Performed by: Annye Asa, MD Pre-anesthesia Checklist: Patient identified, Emergency Drugs available, Suction available and Patient being monitored Patient Re-evaluated:Patient Re-evaluated prior to induction Oxygen Delivery Method: Ambu bag Preoxygenation: Pre-oxygenation with 100% oxygen Induction Type: IV induction, Rapid sequence and Cricoid Pressure applied Ventilation: Mask ventilation without difficulty Laryngoscope Size: Glidescope Tube type: Oral Tube size: 7.0 mm Number of attempts: 1 Placement Confirmation: ETT inserted through vocal cords under direct vision,  CO2 detector and breath sounds checked- equal and bilateral Secured at: 22 cm Tube secured with: RT securement device. Dental Injury: Teeth and Oropharynx as per pre-operative assessment

## 2020-07-15 NOTE — Progress Notes (Signed)
RN let Dr. Doristine Bosworth know that pt was more lethargic

## 2020-07-15 NOTE — Death Summary Note (Signed)
DEATH SUMMARY  OF NOTE I WAS INVOLVED IN PT's CARE FOR LESS THAN 2HRS BUT HAVE BEEN ASKED TO PROVIDE DEATH SUMMARY AND DETAILS OF HER >7day stay. Please see chart for complete details  Patient Details  Name: Crystal [name] MRN: [id number] DOB: [birth year]  Admission/Discharge Information   Admit Date:  06/14/2020  Date of Death: Date of Death: Jul 02, 2020  Time of Death: Time of Death: Sep 21, 2132  Length of Stay: 6  Referring Physician: [name], MD   Reason(s) for Hospitalization  Generalized weakness and palpitations  Diagnoses  Preliminary cause of death:  Secondary Diagnoses (including complications and co-morbidities):  Active Problems:   Atrial fibrillation with rapid ventricular response (HCC)   Atrial fibrillation with controlled ventricular rate [facility])   Brief Hospital Course (including significant findings, care, treatment, and services provided and events leading to death)  Per H&P 1/5 Dr Reesa Chew: HPI: Crystal [name] is a 75 y.o. female with medical history significant of afib, s/p cardioversion about 2 years ago, no longer on anticoagulation, IBS, IPF, hypertension, hyperlipidemia and peripheral vascular disease came to ED with generalized weakness and palpitations.  According to patient and her husband she developed palpitations yesterday, husband noticed high heart rate and gave her his metoprolol that help with the heart rate but dropped her blood pressure.  She came to ED for evaluation and then left without being seen due to long wait time.  This morning she was feeling more weak and continue to have palpitations, talked with her PCP who also evaluated her labs drawn while she was here yesterday which shows mildly elevated troponin and AKI and advised her to come back to ED for further evaluation.  Patient denies any chest pain or shortness of breath.  No orthopnea or PND.  Denies any upper respiratory symptoms or sick contacts.  No current nausea, vomiting or diarrhea.  Apparently  she had an history of IBS and had nausea, vomiting and diarrhea couple of weeks ago and received a course of Augmentin for possible diverticulitis.  Symptoms has been resolved since then.  No fever or chills.  No urinary symptoms.  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  He was not on any anticoagulation for a long time now stating that she was experiencing recurrent GI bleed.  Did not had A. fib since she had her cardioversion approximately 2 years ago.  ED Course: On arrival she was found to be in A. fib/flutter with RVR, labs pertinent for leukocytosis of 22, sodium of 132, potassium of 3.2, creatinine of 1.95, troponin of 42>>53, lactic acid of 2.3, COVID-19 test pending, chest x-ray with small bilateral pleural effusions, left greater than right, left base atelectasis/infiltrate.  per cardiology consult 1/7 Dr Sallyanne Kuster:  Crystal [name] is a 75 yo female with PMH noted above. She is followed by Dr. Stanford Breed as an outpatient. 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 Shortly afterwards developed leg pain 08/2017 and was found to be in atrial fibrillation. US showed thrombotic occlusion of her right SFA with reconstitution of popliteal artery above the knee, consistent with emboli. She was taken to the OR on 08/29/2017 for right femoral exploration and it was felt the SFA occlusion was chronic. She converted sinus rhythm spontaneously. Echo 04/2018 showed normal LV function, mild to moderate mitral regurgitation, moderate left atrial enlargement and mild to moderate mitral regurgitation. She fell in October of 2019 and sustained a subcapital fracture of the right hip.  Was noted to be in atrial fib at that time and placed on amiodarone. Had a cardioversion 06/16/18. Seen for recurrent atrial fib 1/21 and placed back on amiodarone. 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 Last seen in the office on  10/2019. Denied any bleeding, chronic pedal edema. Her amiodarone was stopped and Cardizem increased to 180mg  daily. According to the patient, she had a conversation with Dr. Stanford Breed regarding possibly stopping her Eliquis as she had no recurrence of Afib and had some GI bleeding. She decided to stop. Has been off Eliquis around 6 months. Of note she does use home O2 intermittently, mostly at night if needed. Has also had chronic swelling in her LE for quite some time, but reports recently it seems to be worse and legs have been weeping at times.   Presented to the ED on 1/4 with complaints of generalized weakness and palpitations. Reportedly developed palpitations the day prior to admission. Husband noted her HR being elevated and gave her metoprolol but this then dropped her blood pressure. Then presented to the ED but then left without being seen 2/2 long wait times. She continued having palpitations and talked with her PCP who looked over her labs while in the ED and advised that she come back for evaluation because of elevated troponin and AKI. Presented back to the ED on 1/5.   In the ED she was noted to be in Aflutter RVR. Labs showed Na+ 131>>132>>125, K+ 3.3>>3.2>>2.8, Cr 1.2>>1.95>>1.99, WBC 15.6>>22.0>>20.5, Hgb 11.1>>10.3, Lactic Acid 2.3>>2.0, BNP 1162, hsTn 38>>42>>53. CXR with cardiomegaly with bilateral interstitial prominence suggesting clearing CHF, along with persistent left base atelectasis, moderate left pleural effusion. COVID negative. She was admitted and placed on a Cardizem drip/heparin as well as IV lasix for diuresis.  Per EP consult 1/6 Dr Lovena Le:    Crystal [name] was came to the ER 06/17/20 with progressive weakness SOB, and palpitations the day or so pruior to admission, She took a dose of her husbands metoprolol prior to coming in for fast HR this resulted in lower BP and she came to the ER though the wait very long and wemt home.  With ongoing symptoms came back and was admitted  06/30/2020 She was found in AFlutter w/RVR and volume OL.  Started on dilt gtt, heparin gtt, and lasix IV. Cardiology elicited from the patient that she either self stopped or perhaps misunderstood instructions and stopped her eliquis 6 mo ago.  Heparin gtt has been stopped, eliquis started today with plans for TEE/DCCV tomorrow EP is asked to weigh on AAD options and recommendations   Per ccm consult 1/12: Asked for consult after cardiac arrest on floor. Per husband pt is DNR, he wants minimal interventions done from this point on due to her previously expressed wishes.   75 yo with pmh sfib s/p dccv 2 yrs ago. Htn. 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   She presented 7 days ago in afib/flutter with rvr, started on diltiazem infusion as well as heparin gtt. She subsequently had dccv on 1/7 and started on tikosyn. She has been intermittently in sinus rhythm and was potentially slated for d/c today when she began having intermittent runs of VT was given e-lyte replacement and unfortunately later in the evening when into vfib arrest req acls (see code documentation for complete details).   Ccm consulted at transfer to ICU.   Pt's  post intubation xray revealed PTX req intervention, family declined and comfort measures were pursued.   Pt was extubated 2049 with family at bedside.  Comfort measures continued until expiration shortly after.     Pertinent Labs and Studies  Significant Diagnostic Studies DG Chest 2 View  Result Date: 06/17/2020 CLINICAL DATA:  Chest pain EXAM: CHEST - 2 VIEW COMPARISON:  05/26/2020 chest radiograph. FINDINGS: Stable cardiomediastinal silhouette with mild cardiomegaly. No pneumothorax. Small bilateral pleural effusions, left greater than right. Cephalization of the pulmonary vasculature without overt pulmonary edema. Dense left lung base consolidation. IMPRESSION: 1. Small bilateral pleural  effusions, left greater than right. 2. Mild cardiomegaly without overt pulmonary edema. 3. Dense left lung base consolidation, which could represent atelectasis, aspiration or pneumonia. Chest radiograph follow-up advised. Electronically Signed   By: Ilona Sorrel M.D.   On: 06/17/2020 16:19   DG Abd 1 View  Result Date: 2020/07/02 CLINICAL DATA:  NG tube placement EXAM: ABDOMEN - 1 VIEW COMPARISON:  Chest x-ray 07/02/2020, CT 05/26/2020 FINDINGS: Numerous external leads and support devices. Esophageal tube tip overlies the distal stomach. Pleural effusion and airspace disease at the left base. Right pneumothorax partially visualized but better seen on dedicated chest radiograph. IMPRESSION: 1. Esophageal tube tip overlies the distal stomach. 2. Right pneumothorax partially visualized but better seen on dedicated chest radiograph. Electronically Signed   By: Donavan Foil M.D.   On: July 02, 2020 19:31   DG CHEST PORT 1 VIEW  Result Date: 2020-07-02 CLINICAL DATA:  NG tube placement, intubation EXAM: PORTABLE CHEST 1 VIEW COMPARISON:  06/21/2020 FINDINGS: Endotracheal tube is 4 cm above the carina. NG tube is in the stomach. New right pneumothorax, 10-15% in size. Left lower lobe consolidation and probable left effusion, unchanged. Minimal right base atelectasis or infiltrate. Heart is borderline in size. IMPRESSION: Support devices in expected position. New 10-15% right side pneumothorax. Stable bibasilar opacities and probable left effusion. These results will be called to the ordering clinician or representative by the Radiologist Assistant, and communication documented in the PACS or [employer]. Electronically Signed   By: Rolm Baptise M.D.   On: 07/02/2020 19:08   DG CHEST PORT 1 VIEW  Result Date: 06/21/2020 CLINICAL DATA:  75 year old female with congestive heart failure. EXAM: PORTABLE CHEST 1 VIEW COMPARISON:  06/23/2020 FINDINGS: Unchanged cardiomegaly. The cardiomediastinal silhouette is  otherwise within normal limits. Unchanged small left pleural effusion. Slight interval increased bibasilar subsegmental atelectasis. No new focal consolidative opacities. Visualized osseous structures are within normal limits. IMPRESSION: Slight interval increased bibasilar subsegmental atelectasis. Unchanged small left pleural effusion. Unchanged cardiomegaly. Electronically Signed   By: Ruthann Cancer MD   On: 06/21/2020 08:35   DG Chest Port 1 View  Result Date: 07/09/2020 CLINICAL DATA:  Sepsis.  Chest pain. EXAM: PORTABLE CHEST 1 VIEW COMPARISON:  06/17/2020. FINDINGS: Mediastinum and hilar structures normal. Cardiomegaly. Interim significant clearing of bilateral interstitial prominence suggesting clearing CHF. Mild residual. Persistent left base atelectasis/infiltrate. Interim near complete clearing of right pleural effusion. Persistent moderate left-sided pleural effusion. No pneumothorax. IMPRESSION: 1. Cardiomegaly. Interim significant clearing of bilateral interstitial prominence suggesting clearing CHF. Mild residual. 2. Persistent left base atelectasis/infiltrate and moderate left pleural effusion. Electronically Signed   By: Marcello Moores  Register   On: 07/06/2020 13:44   ECHOCARDIOGRAM COMPLETE  Result Date: 06/19/2020    ECHOCARDIOGRAM REPORT   Patient Name:   Crystal [name] Date of Exam: 06/19/2020 Medical Rec #:  [id number]    Height:  63.0 in Accession #:    [id number]   Weight:       119.9 lb Date of Birth:  31-Dec-1945     BSA:          1.556 m Patient Age:    75 years     BP:           121/51 mmHg Patient Gender: F            HR:           84 bpm. Exam Location:  Inpatient Procedure: 2D Echo, 3D Echo, Cardiac Doppler and Color Doppler Indications:    R94.31 Abnormal EKG  History:        Patient has prior history of Echocardiogram examinations, most                 recent 05/02/2018. Abnormal ECG, Arrythmias:Atrial Fibrillation;                 Risk Factors:Dyslipidemia and Hypertension. Elevated  troponin.  Sonographer:    Roseanna Rainbow RDCS Referring Phys: [id number] [place]  1. There is significant change since the study on 05/02/2018. LVEF has decreased from 55-60% to 35-40% with diffuse hypokinesis. RV is newly severely dilated with severe systolic dysfunction, severe tricuspid regurgitation and moderate pulmonary hypertension RVSP 48 mmHg. Large left pleural effusion.  2. Left ventricular ejection fraction, by estimation, is 35 to 40%. The left ventricle has moderately decreased function. The left ventricle has no regional wall motion abnormalities. There is mild concentric left ventricular hypertrophy. Left ventricular diastolic function could not be evaluated. There is the interventricular septum is flattened in systole and diastole, consistent with right ventricular pressure and volume overload.  3. Right ventricular systolic function is severely reduced. The right ventricular size is severely enlarged. There is moderately elevated pulmonary artery systolic pressure. The estimated right ventricular systolic pressure is 99991111 mmHg.  4. Right atrial size was moderately dilated.  5. Large pleural effusion in the left lateral region.  6. The mitral valve is normal in structure. Moderate mitral valve regurgitation. No evidence of mitral stenosis.  7. Tricuspid valve regurgitation is severe.  8. The aortic valve is normal in structure. Aortic valve regurgitation is not visualized. No aortic stenosis is present.  9. The inferior vena cava is dilated in size with <50% respiratory variability, suggesting right atrial pressure of 15 mmHg. FINDINGS  Left Ventricle: Left ventricular ejection fraction, by estimation, is 35 to 40%. The left ventricle has moderately decreased function. The left ventricle has no regional wall motion abnormalities. The left ventricular internal cavity size was normal in size. There is mild concentric left ventricular hypertrophy. The interventricular septum is flattened in  systole and diastole, consistent with right ventricular pressure and volume overload. Left ventricular diastolic function could not be evaluated due to atrial fibrillation. Left ventricular diastolic function could not be evaluated. Right Ventricle: The right ventricular size is severely enlarged. No increase in right ventricular wall thickness. Right ventricular systolic function is severely reduced. There is moderately elevated pulmonary artery systolic pressure. The tricuspid regurgitant velocity is 2.85 m/s, and with an assumed right atrial pressure of 15 mmHg, the estimated right ventricular systolic pressure is 99991111 mmHg. Left Atrium: Left atrial size was normal in size. Right Atrium: Right atrial size was moderately dilated. Pericardium: There is no evidence of pericardial effusion. Mitral Valve: The mitral valve is normal in structure. Moderate mitral valve regurgitation. No evidence of mitral valve stenosis. Tricuspid Valve: The  tricuspid valve is normal in structure. Tricuspid valve regurgitation is severe. No evidence of tricuspid stenosis. Aortic Valve: The aortic valve is normal in structure. Aortic valve regurgitation is not visualized. No aortic stenosis is present. Pulmonic Valve: The pulmonic valve was normal in structure. Pulmonic valve regurgitation is not visualized. No evidence of pulmonic stenosis. Aorta: The aortic root is normal in size and structure. Venous: The inferior vena cava is dilated in size with less than 50% respiratory variability, suggesting right atrial pressure of 15 mmHg. IAS/Shunts: No atrial level shunt detected by color flow Doppler. Additional Comments: There is a large pleural effusion in the left lateral region.  LEFT VENTRICLE PLAX 2D LVIDd:         3.60 cm LVIDs:         3.10 cm LV PW:         1.50 cm LV IVS:        1.10 cm LVOT diam:     1.70 cm LV SV:         39 LV SV Index:   25 LVOT Area:     2.27 cm  LV Volumes (MOD) LV vol d, MOD A2C: 40.1 ml LV vol d, MOD A4C:  37.4 ml LV vol s, MOD A2C: 25.7 ml LV vol s, MOD A4C: 23.0 ml LV SV MOD A2C:     14.4 ml LV SV MOD A4C:     37.4 ml LV SV MOD BP:      18.8 ml RIGHT VENTRICLE         IVC TAPSE (M-mode): 0.8 cm  IVC diam: 2.40 cm LEFT ATRIUM           Index       RIGHT ATRIUM           Index LA diam:      3.50 cm 2.25 cm/m  RA Area:     18.90 cm LA Vol (A2C): 20.3 ml 13.05 ml/m RA Volume:   58.30 ml  37.47 ml/m LA Vol (A4C): 35.0 ml 22.49 ml/m  AORTIC VALVE LVOT Vmax:   116.00 cm/s LVOT Vmean:  75.800 cm/s LVOT VTI:    0.172 m  AORTA Ao Root diam: 2.50 cm Ao Asc diam:  2.90 cm MITRAL VALVE               TRICUSPID VALVE MV Area (PHT): 5.27 cm    TR Peak grad:   32.5 mmHg MV Decel Time: 144 msec    TR Vmax:        285.00 cm/s MV E velocity: 55.27 cm/s                            SHUNTS                            Systemic VTI:  0.17 m                            Systemic Diam: 1.70 cm Ena Dawley MD Electronically signed by Ena Dawley MD Signature Date/Time: 06/19/2020/12:21:34 PM    Final    ECHO TEE  Result Date: 07/09/2020    TRANSESOPHOGEAL ECHO REPORT   Patient Name:   Crystal [name] Date of Exam: 06/27/2020 Medical Rec #:  [id number]    Height:       63.0 in Accession #:  [id number]   Weight:       145.9 lb Date of Birth:  [birth year]     BSA:          1.691 m Patient Age:    11 years     BP:           104/66 mmHg Patient Gender: F            HR:           108 bpm. Exam Location:  Inpatient Procedure: Transesophageal Echo, Cardiac Doppler, Color Doppler and 3D Echo Indications:    Atrial flutter  History:        Patient has prior history of Echocardiogram examinations, most                 recent 06/19/2020. Arrythmias:Atrial Fibrillation and Atrial                 Flutter; Risk Factors:Hypertension and Former Smoker.  Sonographer:    Clayton Lefort RDCS (AE) Referring Phys: [place]: After discussion of the risks and benefits of a TEE, an informed consent was obtained from the patient. TEE procedure  time was 32 minutes. The transesophogeal probe was passed without difficulty through the esophogus of the patient. Imaged were obtained with the patient in a left lateral decubitus position. Local oropharyngeal anesthetic was provided with Cetacaine. Sedation performed by different physician. The patient was monitored while under deep sedation. Anesthestetic sedation was provided intravenously by Anesthesiology: 216mg  of Propofol, 60mg  of Lidocaine. Image quality was excellent. The patient's vital signs; including heart rate, blood pressure, and oxygen saturation; remained stable throughout the procedure. The patient developed no complications during the procedure. A successful direct current cardioversion was performed at 120 joules with 1 attempt. IMPRESSIONS  1. No LAA thrombus. Successful DCCV after TEE with restoration of NSR.  2. Left ventricular ejection fraction, by estimation, is 45 to 50%. Left ventricular ejection fraction by 3D volume is 47 %. The left ventricle has mildly decreased function. The left ventricle demonstrates global hypokinesis. There is the interventricular septum is flattened in diastole ('D' shaped left ventricle), consistent with right ventricular volume overload.  3. Severe TR is present. 2D PISA 0.9 cm. 2D ERO 0.50 cm2, R vol 37 cc. 3D VCA 0.54 cm2, R vol 40 cc. RF ~51% consistent with severe TR, whic accounts for low SV (SVI=22 cc/m2). TR is central and functional, likely related to atrial flutter/fibrillation.  The tricuspid valve is abnormal. Tricuspid valve regurgitation is severe.  4. Right ventricular systolic function is severely reduced. The right ventricular size is severely enlarged.  5. Left atrial size was mild to moderately dilated. No left atrial/left atrial appendage thrombus was detected. The LAA emptying velocity was 63 cm/s.  6. Right atrial size was severely dilated.  7. A small pericardial effusion is present. The pericardial effusion is surrounding the apex.  8.  The mitral valve is grossly normal. Mild to moderate mitral valve regurgitation. No evidence of mitral stenosis.  9. The aortic valve is grossly normal. Aortic valve regurgitation is not visualized. No aortic stenosis is present. 10. There is mild (Grade II) layered plaque involving the aortic root. 11. Evidence of atrial level shunting detected by color flow Doppler. Agitated saline contrast bubble study was positive with shunting observed within 3-6 cardiac cycles suggestive of interatrial shunt. FINDINGS  Left Ventricle: Left ventricular ejection fraction, by estimation, is 45 to 50%. Left ventricular ejection fraction by 3D volume is  47 %. The left ventricle has mildly decreased function. The left ventricle demonstrates global hypokinesis. The left ventricular internal cavity size was normal in size. The interventricular septum is flattened in diastole ('D' shaped left ventricle), consistent with right ventricular volume overload. Right Ventricle: The right ventricular size is severely enlarged. No increase in right ventricular wall thickness. Right ventricular systolic function is severely reduced. Left Atrium: Left atrial size was mild to moderately dilated. No left atrial/left atrial appendage thrombus was detected. The LAA emptying velocity was 63 cm/s. Right Atrium: Right atrial size was severely dilated. Pericardium: A small pericardial effusion is present. The pericardial effusion is surrounding the apex. Mitral Valve: The mitral valve is grossly normal. Mild to moderate mitral valve regurgitation. No evidence of mitral valve stenosis. Tricuspid Valve: Severe TR is present. 2D PISA 0.9 cm. 2D ERO 0.50 cm2, R vol 37 cc. 3D VCA 0.54 cm2, R vol 40 cc. RF ~51% consistent with severe TR, whic accounts for low SV (SVI=22 cc/m2). TR is central and functional, likely related to atrial flutter/fibrillation. The tricuspid valve is abnormal. Tricuspid valve regurgitation is severe. No evidence of tricuspid stenosis.  Aortic Valve: The aortic valve is grossly normal. Aortic valve regurgitation is not visualized. No aortic stenosis is present. Pulmonic Valve: The pulmonic valve was grossly normal. Pulmonic valve regurgitation is not visualized. No evidence of pulmonic stenosis. Aorta: The aortic root is normal in size and structure. There is mild (Grade II) layered plaque involving the aortic root. Venous: The left lower pulmonary vein, left upper pulmonary vein, right upper pulmonary vein and right lower pulmonary vein are normal. IAS/Shunts: There is left bowing of the interatrial septum, suggestive of elevated right atrial pressure. Evidence of atrial level shunting detected by color flow Doppler. Agitated saline contrast was given intravenously to evaluate for intracardiac shunting. Agitated saline contrast bubble study was positive with shunting observed within 3-6 cardiac cycles suggestive of interatrial shunt. Additional Comments: There is a small pleural effusion in the left lateral region.  LEFT VENTRICLE PLAX 2D LVOT diam:     1.90 cm LV SV:         38 LV SV Index:   22              3D Volume EF LVOT Area:     2.84 cm        LV 3D EF:    Left                                             ventricular                                             ejection                                             fraction by                                             3D volume  is 47 %.                                 3D Volume EF                                LV 3D EF:    47.10 %                                LV 3D EDV:   50000.00                                             mm                                LV 3D ESV:   26400.00                                             mm                                LV 3D SV:    23500.00                                             mm                                 3D Volume EF:                                3D EF:        47 % AORTIC VALVE LVOT Vmax:    91.27 cm/s LVOT Vmean:  58.187 cm/s LVOT VTI:    0.134 m TRICUSPID VALVE TR Peak grad:   19.5 mmHg TR Vmax:        221.00 cm/s  SHUNTS Systemic VTI:  0.13 m Systemic Diam: 1.90 cm Eleonore Chiquito MD Electronically signed by Eleonore Chiquito MD Signature Date/Time: 07/04/2020/2:49:28 PM    Final     Microbiology No results found for this or any previous visit (from the past 240 hour(s)).  Lab Basic Metabolic Panel: No results for input(s): NA, K, CL, CO2, GLUCOSE, BUN, CREATININE, CALCIUM, MG, PHOS in the last 168 hours. Liver Function Tests: No results for input(s): AST, ALT, ALKPHOS, BILITOT, PROT, ALBUMIN in the last 168 hours. No results for input(s): LIPASE, AMYLASE in the last 168 hours. No results for input(s): AMMONIA in the last 168 hours. CBC: No results for input(s): WBC, NEUTROABS, HGB, HCT, MCV, PLT in the last 168 hours. Cardiac Enzymes: No results for input(s): CKTOTAL, CKMB, CKMBINDEX, TROPONINI in the last 168 hours. Sepsis Labs: No results for input(s): PROCALCITON, WBC, LATICACIDVEN in the last 168 hours.  Procedures/Operations  See chart for complete details   Audria Nine 07/11/2020, 7:56 AM

## 2020-07-15 NOTE — Progress Notes (Signed)
eLink Physician-Brief Progress Note Patient Name: MATISSE ROSKELLEY DOB: [birth year] MRN: [id number]   Date of Service  07-25-2020  HPI/Events of Note  Called by radiology. Patient has new 10-15% R pneumothorax. Patient is intubated and ventilated.   eICU Interventions  PCCM ground team notified of new R pneumothorax.     Intervention Category Major Interventions: Other:  Lysle Dingwall 25-Jul-2020, 7:32 PM

## 2020-07-15 NOTE — NC FL2 (Cosign Needed Addendum)
[place] LEVEL OF CARE SCREENING TOOL     IDENTIFICATION  Patient Name: Crystal [name] Birthdate: August 09, 1945 Sex: female Admission Date (Current Location): 06-28-20  [facility] and [place] Number:  Herbalist and Address:  [facility], [place] [address] [place], [place] [address]      Provider Number: [id number]  Attending Physician Name and Address:  Darliss Cheney, MD  Relative Name and Phone Number:  Mel[place], [place] Daughter     [phone]    Current Level of Care: Hospital Recommended Level of Care: [place] Prior Approval Number:    Date Approved/Denied:   PASRR Number: [id number] Discharge Plan: SNF    Current Diagnoses: Patient Active Problem List   Diagnosis Date Noted  . Atrial fibrillation with controlled ventricular rate ([place]) 06/19/2020  . Elevated troponin Jun 28, 2020  . Atrial fibrillation with rapid ventricular response ([place]) 28-Jun-2020  . AKI (acute kidney injury) ([place])   . Leukocytosis   . Diverticulitis 05/30/2020  . Pleural effusion 05/30/2020  . Leg laceration, left, initial encounter 02/13/2020  . Chronic respiratory failure with hypoxia ([place]) 01/29/2020  . ILD (interstitial lung disease) ([place]) 01/29/2020  . Chronic gout 09/21/2019  . Vertigo of central origin 02/09/2019  . Other peripheral vertigo, unspecified ear 02/09/2019  . Pruritus 01/02/2019  . Hypokalemia 08/09/2018  . Long term (current) use of anticoagulants 08/01/2018  . Closed compression fracture of L4 vertebra ([place]) 07/11/2018  . Bilateral leg edema 07/11/2018  . Lung nodule < 6cm on CT 07/11/2018  . Hypothyroidism (acquired) 04/11/2018  . Fracture of femoral neck, right ([place]) 03/22/2018  . PAF (paroxysmal atrial fibrillation) ([place]) 03/18/2018  . 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 Herpes zoster without complication 64/33/2951  . Temporal arteritis ([place]) 07/18/2017  . Vision, loss, sudden, bilateral  07/18/2017  . Osteoarthritis 06/25/2016  . Anxiety 06/25/2016  . Back pain 07/18/2015  . 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 Carotid bruit present 06/05/2010  . Osteoporosis 06/03/2008  . History of colonic polyps 06/03/2008  . INTENTION TREMOR 12/06/2007  . IBS 10/16/2007  . Hyperlipidemia 06/06/2007  . Essential hypertension 06/06/2007  . Chronic obstructive airway disease with asthma ([place]) 02/23/2007    Orientation RESPIRATION BLADDER Height & Weight     Self,Situation,Place  O2 (2L [place]) External catheter Weight: 125 lb 10.6 oz (57 kg) Height:  5\' 3"  (160 cm)  BEHAVIORAL SYMPTOMS/MOOD NEUROLOGICAL BOWEL NUTRITION STATUS      Continent Diet (see d/c summary)  AMBULATORY STATUS COMMUNICATION OF NEEDS Skin   Extensive Assist Verbally Normal                       Personal Care Assistance Level of Assistance  Bathing,Feeding,Dressing Bathing Assistance: Limited assistance Feeding assistance: Independent Dressing Assistance: Limited assistance     Functional Limitations Info  Sight,Hearing,Speech Sight Info: Impaired Hearing Info: Adequate Speech Info: Adequate    SPECIAL CARE FACTORS FREQUENCY  OT (By licensed OT),PT (By licensed PT)     PT Frequency: 5x/week OT Frequency: 5x/week            Contractures Contractures Info: Not present    Additional Factors Info  Code Status,Allergies Code Status Info: Full Allergies Info: Simvastatin           Current Medications (07/06/2020):  This is the current hospital active medication list Current Facility-Administered Medications  Medication Dose Route Frequency Provider Last Rate Last Admin  . 0.9 %  sodium chloride  infusion  250 mL Intravenous PRN Shirley Friar, PA-C      . acetaminophen (TYLENOL) tablet 650 mg  650 mg Oral Q4H PRN Geralynn Rile, MD   650 mg at 06/22/20 1552  . alum & mag hydroxide-simeth (MAALOX/MYLANTA) 200-200-20 MG/5ML suspension 30 mL  30 mL Oral Q6H PRN Zierle-Ghosh, Asia B,  DO   30 mL at 06/21/20 2139  . apixaban (ELIQUIS) tablet 5 mg  5 mg Oral BID Geralynn Rile, MD   5 mg at 07-21-20 0908  . dofetilide (TIKOSYN) capsule 250 mcg  250 mcg Oral BID Shirley Friar, PA-C   250 mcg at July 21, 2020 8242  . empagliflozin (JARDIANCE) tablet 10 mg  10 mg Oral Daily [name], MD   10 mg at 2020-07-21 0908  . gabapentin (NEURONTIN) capsule 600 mg  600 mg Oral QHS Geralynn Rile, MD   600 mg at 06/24/20 2125  . lactated ringers bolus 1,000 mL  1,000 mL Intravenous Once Geralynn Rile, MD      . levothyroxine (SYNTHROID) tablet 75 mcg  75 mcg Oral Q0600 Geralynn Rile, MD   75 mcg at 07/21/2020 0531  . loperamide (IMODIUM) capsule 2 mg  2 mg Oral PRN Darliss Cheney, MD   2 mg at 06/23/20 2115  . LORazepam (ATIVAN) tablet 1 mg  1 mg Oral QHS Geralynn Rile, MD   1 mg at 06/24/20 2126  . magnesium oxide (MAG-OX) tablet 400 mg  400 mg Oral Daily Shirley Friar, PA-C   400 mg at 2020/07/21 3536  . metoprolol succinate (TOPROL-XL) 24 hr tablet 75 mg  75 mg Oral Daily [name], MD   75 mg at 21-Jul-2020 0908  . montelukast (SINGULAIR) tablet 10 mg  10 mg Oral QHS Geralynn Rile, MD   10 mg at 06/24/20 2125  . ondansetron (ZOFRAN) injection 4 mg  4 mg Intravenous Q6H PRN Geralynn Rile, MD   4 mg at July 21, 2020 0924  . oxyCODONE (Oxy IR/ROXICODONE) immediate release tablet 5 mg  5 mg Oral Q8H PRN Darliss Cheney, MD   5 mg at 06/24/20 2125  . potassium chloride SA (KLOR-CON) CR tablet 40 mEq  40 mEq Oral Q4H Einar Grad, RPH   40 mEq at 2020/07/21 0905  . sodium chloride flush (NS) 0.9 % injection 3 mL  3 mL Intravenous Q12H Shirley Friar, PA-C   3 mL at 07-21-20 1000  . sodium chloride flush (NS) 0.9 % injection 3 mL  3 mL Intravenous PRN Shirley Friar, PA-C      . theophylline (UNIPHYL) 400 MG 24 hr tablet 200 mg  200 mg Oral BID Geralynn Rile, MD   200 mg at 2020-07-21 0906  . traMADol  (ULTRAM) tablet 50 mg  50 mg Oral Q12H PRN Geralynn Rile, MD   50 mg at 06/23/20 1003     Discharge Medications: Please see discharge summary for a list of discharge medications.  Relevant Imaging Results:  Relevant Lab Results:   Additional Information SS#: [id number]  Bethann Berkshire, LCSW

## 2020-07-15 NOTE — Progress Notes (Signed)
CTSP for increasing ectopy  Had received dofetilide for afib and this am started having increasing atrial ectopy  This am also received zofran  ECG reviewed for QT prolongation, and while I thought on 1600 tracings was modest ( QT about 400) by 1645 on telemetry much longer  This afternoon began to have more complex ectopy and further review of tel >> freq and increasingly frequent runs of VT NS polymorphic Spoke with patient  Denied sob or chest pain or palpitations.   Was going to give lisuprel to increase HR and lido both intended to shorten the QT interval  But prior to administration>> VF  Coded  Shocked CPR epi with ROSC  and then norepi foR HYPOTENSION and lido for QT prolongation,  There was also freq atrial tach with HR 140+ and given a bolus of amio and then this was stopped  Intubated   There was a discrepancy of some magnitude  The PT is wearing a DNR bracelet and yet there is no DNR order and according to nursing the [facility] said :" pt was full code"and so we elected to proceed.  With trepdiation   We were successful as above  Transferred into CCU  Clarification of code status, per the husband, and corroborated by her son, it was her desire NOT to resuscitate and yet both were appreciative of our having made the efforts and yet understand that given the patients wishes it would be a wrong decision to undertake further CPR, and we will not escalate, but we have decided for now to not withdraw the resuscitative efforts  [phone] critical care time

## 2020-07-15 NOTE — Discharge Instructions (Signed)

## 2020-07-15 NOTE — Progress Notes (Signed)
[place] notified of patient's death. Pre-lim call. Will call back for donor availablity.  Referral # [id number]

## 2020-07-15 NOTE — Consult Note (Signed)
NAME:  Crystal [name], MRN:  [id number], DOB:  [birth year], LOS: 6 ADMISSION DATE:  10-Jul-2020, CONSULTATION DATE:  06/24/2020 REFERRING MD:  Dr Doristine Bosworth, CHIEF COMPLAINT:  Post Vfib Arrest   Brief History   75 yo s/p cardiac arrest 2/2 vfib. Cardiology onboard on lidocaine/norepi and vent.  History of present illness   75 yo with pmh sfib s/p dccv 2 yrs ago. Htn. 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   She presented 7 days ago in afib/flutter with rvr, started on diltiazem infusion as well as heparin gtt. She subsequently had dccv on 1/7 and started on tikosyn. She has been intermittently in sinus rhythm and was potentially slated for d/c today when she began having intermittent runs of VT was given e-lyte replacement and unfortunately later in the evening when into vfib arrest req acls (see code documentation for complete details).   Ccm consulted at transfer to ICU.   Past Medical History   Past Medical History:  Diagnosis Date  . Adenomatous colon polyp 2007 & 2010    Dr [name]  . Allergy    seasonal  . Arthritis   . Asthma   . Cataract   . Diverticulosis   . 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 Hyperlipidemia   . Hypertension   . IBS (irritable bowel syndrome)   . Legally blind   . MVP (mitral valve prolapse)   . Paroxysmal atrial fibrillation with rapid ventricular response ([place]) 08/29/2017  . Peripheral vascular disease ([place])   . RAD (reactive airway disease)     Significant Hospital Events   1/5 admitted 1/7 dccv 1/12 vfib arrest  Consults:  Ep Cards ccm  Procedures:  1/7: dccv  Significant Diagnostic Tests:    Micro Data:    Antimicrobials:    Interim history/subjective:  1/12: consulted with icu transfer Objective   Blood pressure 102/79, pulse (!) 103, temperature 97.7 F (36.5 C), temperature source Oral, resp. rate (!) 21, height 5\' 3"  (1.6 m), weight 57 kg, SpO2 94  %.        Intake/Output Summary (Last 24 hours) at 06/14/2020 1736 Last data filed at 06/17/2020 1215 Gross per 24 hour  Intake --  Output 1350 ml  Net -1350 ml   Filed Weights   06/23/20 0520 06/23/20 2319 07/04/2020 0515  Weight: 59.9 kg 57.4 kg 57 kg    Examination: General: unresponsive, vent in place HEENT: NCAT, PERRLA but sluggish, MMMP Lungs: CTA, no wheezes, rhonchi, rales Cardiovascular: tachycardic, no m/g/r Abdomen: soft, NT,ND, BS+ Extremities: no edema, no clubbing/cyanosis Skin: multiple skin lesions noted warm and dry Neuro: unresponsive GU: deferred  Resolved Hospital Problem list     Assessment & Plan:  ihca s/p rosc vfib arrest -rosc achieved -on lidocaine, norepi -titrate pressors -no escalation of care -monitor neuro status -prn sedation -goal mag >2 and K >4 afib with rvr:  -cont eliquis -stop tikosyn Shock 2/2 cardiogenic:  -titrate vasopressor to map >65 -husband has declined central line placement decline arterial Line as well -he expressedunderstanding of risks of higher dose levo via piv but does not want her to be subjected to other procedures.  -no escalation of care past 60mcg  Acute hypoxic resp failure -intubated -titrate vent IPF:  -noted and stable at last eval from 2/21 to 10/21  Best practice:  Diet: npo Pain/Anxiety/Delirium protocol (if indicated): prn VAP protocol (if indicated): per protocol DVT prophylaxis: eliquis GI prophylaxis: protonix Glucose  control: monito Mobility: bed rest Code Status: dnr without escalation of care Family Communication: husband at bedside, pt states almost every night that "I pray I don't wake up in the morning" ever since her blindness. He is hopeful she awakens but wants her comfortable and at peace as well.  Disposition: ICU  Labs   CBC: Recent Labs  Lab 06/19/20 0046 06/22/2020 0102 06/22/20 0120  WBC 20.5* 20.7* 14.6*  NEUTROABS  --   --  10.4*  HGB 10.3* 10.0* 9.8*  HCT  35.8* 34.6* 32.6*  MCV 84.2 83.2 82.5  PLT 523* 544* 490*    Basic Metabolic Panel: Recent Labs  Lab 06/21/20 0104 06/22/20 0120 06/23/20 0052 06/24/20 0042 06/24/20 0654 July 02, 2020 0101  NA 134* 134* 134* 134*  --  133*  K 4.5 3.7 4.4 4.8  --  3.2*  CL 101 98 99 95*  --  92*  CO2 23 25 24 28   --  29  GLUCOSE 90 87 90 82  --  91  BUN 11 7* 7* 7*  --  <5*  CREATININE 0.96 0.76 0.71 0.75  --  0.68  CALCIUM 8.4* 8.6* 8.8* 9.1  --  9.0  MG 2.1 1.5* 1.9  --  1.8 1.7   GFR: Estimated Creatinine Clearance: 49.5 mL/min (by C-G formula based on SCr of 0.68 mg/dL). Recent Labs  Lab 06/19/20 0046 06/19/20 1257 06/19/20 1620 06/16/2020 0102 06/21/20 1119 06/21/20 1431 06/22/20 0120  WBC 20.5*  --   --  20.7*  --   --  14.6*  LATICACIDVEN  --  2.3* 3.0*  --  1.9 1.5  --     Liver Function Tests: No results for input(s): AST, ALT, ALKPHOS, BILITOT, PROT, ALBUMIN in the last 168 hours. No results for input(s): LIPASE, AMYLASE in the last 168 hours. No results for input(s): AMMONIA in the last 168 hours.  ABG No results found for: PHART, PCO2ART, PO2ART, HCO3, TCO2, ACIDBASEDEF, O2SAT   Coagulation Profile: Recent Labs  Lab 07/04/2020 0102  INR 1.2    Cardiac Enzymes: No results for input(s): CKTOTAL, CKMB, CKMBINDEX, TROPONINI in the last 168 hours.  HbA1C: Hgb A1c MFr Bld  Date/Time Value Ref Range Status  01/02/2019 12:12 PM 4.5 (L) 4.6 - 6.5 % Final    Comment:    Glycemic Control Guidelines for People with Diabetes:Non Diabetic:  <6%Goal of Therapy: <7%Additional Action Suggested:  >8%     CBG: No results for input(s): GLUCAP in the last 168 hours.  Review of Systems:   Unobtainable 2/2 mental status.   Past Medical History  She,  has a past medical history of Adenomatous colon polyp (2007 & 2010), Allergy, Arthritis, Asthma, Cataract, Diverticulosis, Femoral artery thrombosis, right ([place]) (09/01/2017), Hyperlipidemia, Hypertension, IBS (irritable bowel  syndrome), Legally blind, MVP (mitral valve prolapse), Paroxysmal atrial fibrillation with rapid ventricular response ([place]) (08/29/2017), Peripheral vascular disease ([place]), and RAD (reactive airway disease).   Surgical History    Past Surgical History:  Procedure Laterality Date  . ABDOMINAL HYSTERECTOMY     with USO for dysfunctionall menses  . APPENDECTOMY    . ARTERY BIOPSY Bilateral 07/21/2017   Procedure: BIOPSY BILATERAL TEMPORAL ARTERY;  Surgeon: [name], MD;  Location: [place];  Service: Vascular;  Laterality: Bilateral;  . BUBBLE STUDY  07/14/2020   Procedure: BUBBLE STUDY;  Surgeon: Geralynn Rile, MD;  Location: [place];  Service: Cardiovascular;;  . CARDIOVERSION N/A 06/16/2018   Procedure: CARDIOVERSION;  Surgeon: [name]  S, MD;  Location: [place];  Service: Cardiovascular;  Laterality: N/A;  . CARDIOVERSION N/A 06/21/2020   Procedure: CARDIOVERSION;  Surgeon: Geralynn Rile, MD;  Location: [place];  Service: Cardiovascular;  Laterality: N/A;  . CATARACT EXTRACTION W/ INTRAOCULAR LENS IMPLANT  2013   bilateral; Dr [name]  . COLONOSCOPY  2013   negative , due 2018; Dr [name]  . COLONOSCOPY W/ POLYPECTOMY      X2 ; diverticulosis  . EMBOLECTOMY Right 08/29/2017   Procedure: Right Groin Exploration  ;  Surgeon: Rosetta Posner, MD;  Location: [place];  Service: Vascular;  Laterality: Right;  . HIP PINNING,CANNULATED Right 03/21/2018  . HIP PINNING,CANNULATED Right 03/21/2018   Procedure: CANNULATED HIP PINNING;  Surgeon: Rod Can, MD;  Location: [place];  Service: Orthopedics;  Laterality: Right;  . LUMBAR LAMINECTOMY  2000   Dr Vertell Limber  . SHOULDER SURGERY     R shoulder  . TEE WITHOUT CARDIOVERSION N/A 06/24/2020   Procedure: TRANSESOPHAGEAL ECHOCARDIOGRAM (TEE);  Surgeon: Geralynn Rile, MD;  Location: [place];  Service: Cardiovascular;  Laterality: N/A;  . TONSILLECTOMY       Social History   reports that she quit  smoking about 31 years ago. She has a 23.00 pack-year smoking history. She has never used smokeless tobacco. She reports current alcohol use of about 14.0 standard drinks of alcohol per week. She reports that she does not use drugs.   Family History   Her family history includes COPD in her sister; Diabetes in her sister; Heart attack in her father; Heart attack (age of onset: 51) in her brother; Other in her son; Parkinsonism in her mother; Stroke in her mother. There is no history of Colon cancer, Cancer, Esophageal cancer, Liver cancer, Pancreatic cancer, Rectal cancer, or Stomach cancer.   Allergies Allergies  Allergen Reactions  . Simvastatin Other (See Comments)    myalgias     Home Medications  Prior to Admission medications   Medication Sig Start Date End Date Taking? Authorizing Provider  albuterol (VENTOLIN HFA) 108 (90 Base) MCG/ACT inhaler INHALE ONE PUFF BY MOUTH TWICE A DAY AS NEEDED Patient taking differently: Inhale 1-2 puffs into the lungs every 6 (six) hours as needed for wheezing. 05/01/20  Yes Burns, Claudina Lick, MD  Calcium Citrate-Vitamin D (CALCIUM + D PO) Take 1 tablet by mouth daily.   Yes [provider]  diphenoxylate-atropine (LOMOTIL) 2.5-0.025 MG tablet TAKE ONE TABLET BY MOUTH FOUR TIMES A DAY AS NEEDED FOR DIARRHEA OR LOOSE STOOLS 06/16/20  Yes Burns, Claudina Lick, MD  furosemide (LASIX) 40 MG tablet TAKE ONE TABLET BY MOUTH DAILY 02/04/20  Yes Burns, Claudina Lick, MD  gabapentin (NEURONTIN) 300 MG capsule Take 2 capsules (600 mg total) by mouth at bedtime. 09/21/19  Yes Burns, Claudina Lick, MD  KLOR-CON M20 20 MEQ tablet TAKE TWO TABLETS BY MOUTH DAILY Patient taking differently: Take 20 mEq by mouth daily. 02/04/20  Yes Burns, Claudina Lick, MD  levothyroxine (SYNTHROID) 75 MCG tablet Take 1 tablet (75 mcg total) by mouth daily. 01/16/20  Yes Burns, Claudina Lick, MD  LORazepam (ATIVAN) 1 MG tablet TAKE ONE TABLET BY MOUTH EVERY NIGHT AT BEDTIME Patient taking differently: Take 1 mg by  mouth at bedtime. 05/13/20  Yes Burns, Claudina Lick, MD  montelukast (SINGULAIR) 10 MG tablet TAKE ONE TABLET BY MOUTH EVERY NIGHT AT BEDTIME Patient taking differently: Take 10 mg by mouth at bedtime. 03/24/20  Yes [name], MD  predniSONE (  DELTASONE) 5 MG tablet Take 5 mg by mouth daily. 03/12/20  Yes [provider]  SYMBICORT 160-4.5 MCG/ACT inhaler INHALE 1 TO 2 PUFFS BY MOUTH EVERY 12 HOURS THEN GARGLE AND SPIT AFTER USE Patient taking differently: Inhale 2 puffs into the lungs 2 times daily at 12 noon and 4 pm. 01/21/20  Yes Burns, Claudina Lick, MD  theophylline (UNIPHYL) 400 MG 24 hr tablet TAKE 1/2 TABLET BY MOUTH TWO TIMES A DAY Patient taking differently: Take 200 mg by mouth in the morning and at bedtime. 01/14/20  Yes Burns, Claudina Lick, MD  traMADol (ULTRAM) 50 MG tablet TAKE ONE TABLET BY MOUTH EVERY 8 HOURS AS NEEDED FOR MODERATE PAIN Patient taking differently: Take 50 mg by mouth 3 (three) times daily as needed for moderate pain. 05/21/20  Yes Burns, Claudina Lick, MD  allopurinol (ZYLOPRIM) 300 MG tablet Take 1 tablet (300 mg total) by mouth daily. Patient not taking: No sig reported 04/09/20   [name], MD  amoxicillin-clavulanate (AUGMENTIN) 875-125 MG tablet Take 1 tablet by mouth 2 (two) times daily. Patient not taking: No sig reported 05/30/20   [name], MD  apixaban (ELIQUIS) 5 MG TABS tablet Take 1 tablet (5 mg total) by mouth 2 (two) times daily. Jul 24, 2020 07/25/20  Darliss Cheney, MD  Cholecalciferol (VITAMIN D3) 1000 units CAPS Take 1 capsule (1,000 Units total) by mouth daily with lunch. Patient not taking: No sig reported 03/30/18   Angiulli, Lavon Paganini, PA-C  diclofenac Sodium (VOLTAREN) 1 % GEL Apply 2 g topically 2 (two) times daily as needed (back pain).    [provider]  diltiazem (CARDIZEM CD) 180 MG 24 hr capsule Take 1 capsule (180 mg total) by mouth daily. 10/29/19 01/27/20  [name], MD  dofetilide (TIKOSYN) 250 MCG capsule Take 1 capsule (250  mcg total) by mouth 2 (two) times daily. 07/24/2020 07/25/20  Darliss Cheney, MD  empagliflozin (JARDIANCE) 10 MG TABS tablet Take 1 tablet (10 mg total) by mouth daily. 06/26/20 07/26/20  Darliss Cheney, MD  methocarbamol (ROBAXIN) 500 MG tablet TAKE ONE TABLET BY MOUTH EVERY 6 HOURS AS NEEDED FOR MUSCLE SPASMS Patient taking differently: Take 500 mg by mouth every 6 (six) hours as needed for muscle spasms. 03/03/20   [name], MD  metoprolol succinate (TOPROL-XL) 25 MG 24 hr tablet Take 3 tablets (75 mg total) by mouth daily. 07/24/20 07/25/20  Darliss Cheney, MD  ondansetron (ZOFRAN) 4 MG tablet Take 1 tablet (4 mg total) by mouth every 8 (eight) hours as needed for nausea or vomiting. 06/16/20   [name], MD     Critical care time: The patient is critically ill with multiple organ systems failure and requires high complexity decision making for assessment and support, frequent evaluation and titration of therapies, application of advanced monitoring technologies and extensive interpretation of multiple databases.  Critical care time 35 mins. This represents my time independent of the NP's/PA's/med students/residents time taking care of the pt. This is excluding procedures.     [facility] 2020-07-24, 5:36 PM

## 2020-07-15 DEATH — deceased

## 2020-07-16 ENCOUNTER — Ambulatory Visit: Payer: Medicare Other | Admitting: Internal Medicine

## 2020-07-30 ENCOUNTER — Telehealth: Payer: Medicare Other

## 2020-08-01 ENCOUNTER — Ambulatory Visit: Payer: Medicare Other | Admitting: Internal Medicine

## 2020-09-05 ENCOUNTER — Ambulatory Visit: Payer: Medicare Other | Admitting: Cardiology

## 2020-09-08 ENCOUNTER — Ambulatory Visit: Payer: Medicare Other | Admitting: Cardiology

## 2020-12-01 ENCOUNTER — Ambulatory Visit: Payer: Medicare Other | Admitting: Internal Medicine
